# Patient Record
Sex: Female | Born: 1945
Health system: Southern US, Community
[De-identification: ages and names within clinical notes are randomized; demographics above are authoritative.]

## PROBLEM LIST (undated history)

## (undated) DIAGNOSIS — J449 Chronic obstructive pulmonary disease, unspecified: Secondary | ICD-10-CM

## (undated) DIAGNOSIS — G43909 Migraine, unspecified, not intractable, without status migrainosus: Secondary | ICD-10-CM

## (undated) DIAGNOSIS — Z720 Tobacco use: Secondary | ICD-10-CM

## (undated) DIAGNOSIS — Z8673 Personal history of transient ischemic attack (TIA), and cerebral infarction without residual deficits: Secondary | ICD-10-CM

## (undated) DIAGNOSIS — F32A Depression, unspecified: Secondary | ICD-10-CM

## (undated) DIAGNOSIS — E785 Hyperlipidemia, unspecified: Secondary | ICD-10-CM

## (undated) DIAGNOSIS — G2581 Restless legs syndrome: Secondary | ICD-10-CM

## (undated) DIAGNOSIS — G4700 Insomnia, unspecified: Secondary | ICD-10-CM

## (undated) DIAGNOSIS — Z87820 Personal history of traumatic brain injury: Secondary | ICD-10-CM

## (undated) DIAGNOSIS — R0981 Nasal congestion: Secondary | ICD-10-CM

## (undated) DIAGNOSIS — F329 Major depressive disorder, single episode, unspecified: Secondary | ICD-10-CM

## (undated) DIAGNOSIS — R05 Cough: Secondary | ICD-10-CM

## (undated) DIAGNOSIS — Z87442 Personal history of urinary calculi: Secondary | ICD-10-CM

## (undated) DIAGNOSIS — E559 Vitamin D deficiency, unspecified: Secondary | ICD-10-CM

## (undated) DIAGNOSIS — Z98811 Dental restoration status: Secondary | ICD-10-CM

## (undated) DIAGNOSIS — J45909 Unspecified asthma, uncomplicated: Secondary | ICD-10-CM

## (undated) DIAGNOSIS — Z972 Presence of dental prosthetic device (complete) (partial): Secondary | ICD-10-CM

## (undated) DIAGNOSIS — Z9889 Other specified postprocedural states: Secondary | ICD-10-CM

## (undated) DIAGNOSIS — C50919 Malignant neoplasm of unspecified site of unspecified female breast: Secondary | ICD-10-CM

## (undated) DIAGNOSIS — R112 Nausea with vomiting, unspecified: Secondary | ICD-10-CM

## (undated) HISTORY — DX: Migraine, unspecified, not intractable, without status migrainosus: G43.909

## (undated) HISTORY — DX: Major depressive disorder, single episode, unspecified: F32.9

## (undated) HISTORY — PX: PLANTAR FASCIA SURGERY: SHX746

## (undated) HISTORY — PX: NASAL SEPTUM SURGERY: SHX37

## (undated) HISTORY — DX: Chronic obstructive pulmonary disease, unspecified: J44.9

## (undated) HISTORY — DX: Restless legs syndrome: G25.81

## (undated) HISTORY — DX: Unspecified asthma, uncomplicated: J45.909

## (undated) HISTORY — PX: ABDOMINAL HYSTERECTOMY: SHX81

## (undated) HISTORY — PX: BREAST BIOPSY: SHX20

## (undated) HISTORY — DX: Hyperlipidemia, unspecified: E78.5

## (undated) HISTORY — PX: KNEE ARTHROSCOPY: SUR90

## (undated) HISTORY — PX: CARPAL TUNNEL RELEASE: SHX101

## (undated) HISTORY — DX: Depression, unspecified: F32.A

## (undated) HISTORY — DX: Insomnia, unspecified: G47.00

---

## 1998-03-05 HISTORY — PX: APPENDECTOMY: SHX54

## 1998-03-05 HISTORY — PX: ABDOMINAL HYSTERECTOMY: SUR658

## 2008-03-19 ENCOUNTER — Emergency Department (HOSPITAL_COMMUNITY): Admission: EM | Admit: 2008-03-19 | Discharge: 2008-03-19 | Payer: Self-pay | Admitting: Emergency Medicine

## 2008-03-19 ENCOUNTER — Ambulatory Visit: Payer: Self-pay | Admitting: *Deleted

## 2010-06-19 LAB — POCT I-STAT, CHEM 8
HCT: 44 % (ref 36.0–46.0)
Hemoglobin: 15 g/dL (ref 12.0–15.0)
Sodium: 138 mEq/L (ref 135–145)
TCO2: 25 mmol/L (ref 0–100)

## 2010-06-19 LAB — CBC
HCT: 42.7 % (ref 36.0–46.0)
MCV: 90.1 fL (ref 78.0–100.0)
Platelets: 224 10*3/uL (ref 150–400)
RDW: 12.3 % (ref 11.5–15.5)

## 2010-06-19 LAB — DIFFERENTIAL
Basophils Absolute: 0.1 10*3/uL (ref 0.0–0.1)
Eosinophils Absolute: 0.2 10*3/uL (ref 0.0–0.7)
Eosinophils Relative: 2 % (ref 0–5)

## 2010-06-19 LAB — POCT CARDIAC MARKERS: Myoglobin, poc: 42.2 ng/mL (ref 12–200)

## 2010-07-18 NOTE — Discharge Summary (Signed)
Anne [name], EGLEY               ACCOUNT NO.:  [id number]   MEDICAL RECORD NO.:  [id number]          PATIENT TYPE:  EMS   LOCATION:  [facility]JO                         FACILITY:  [facility]   PHYSICIAN:  Manning Charity, MD     DATE OF BIRTH:  1945/04/07   DATE OF ADMISSION:  03/19/2008  DATE OF DISCHARGE:  03/19/2008                               DISCHARGE SUMMARY   PRIMARY CARE PHYSICIAN:  Dr. Jacqualine [name].   Please note that the patient left AMA despite medical advice to remain  in the hospital and be admitted to rule out acute coronary syndrome.  The patient was aware that the risks of leaving the hospital against  medical advice included coronary ischemia and possible resultant death.  Despite this recommendation, the patient decided to leave the hospital  and pursue further care with her PCP, Dr. Lodema [name] and Dr. Bing [name].  Upon urging, she was amenable to continuing with possible cardiac workup  with her cardiologist and should pursue this upon discharge.   DISCHARGE DIAGNOSES:  1. Chest pain, with one negative cardiac enzyme, a normal EKG;      however, the patient refused to be admitted to have a full set of      cardiac enzymes cycled and thus could not be ruled out for an acute      coronary syndrome.  2. History of chronic obstructive pulmonary disease without oxygen      dependence and no previous hospitalizations.  3. Hyperlipidemia.  The patient is supposed to be on treatment, but      has not been taking it.  4. Irritable bowel syndrome/anxiety/insomnia.  5. History of ulcers greater than 10 years ago.  6. Seasonal allergies.  7. Migraines.  8. Status post hysterectomy, status post appendectomy, status post      oophorectomy, status post tonsillectomy.   DISCHARGE MEDICATIONS:  Given that the patient was leaving AMA, she is  to continue on the same medications that she was admitted with.  If she  has recurrent chest pain, she should take an aspirin and go immediately  to the ED.   CONDITION ON DISCHARGE:  Not able to be appropriately assessed secondary  to the patient leaving AMA.  The patient's chest pain had resolved;  however, cardiac ischemia could not be ruled out.   PROCEDURES:  A chest x-ray was obtained, which showed chronic-appearing  upper lobe scarring changes.  No definitive acute pulmonary process.   CONSULTATIONS:  None.   ADMISSION HISTORY AND PHYSICAL:  The patient is a 65 year old smoker  with hyperlipidemia, not on any medications; poor hyperlipidemia; COPD;  anxiety, who presents with sharp stabbing chest pain in the precordium  with radiation to the back.  Chest pain started at 10 a.m. this morning  and lasted until approximately 1 p.m. while the patient was in the ED.  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  The pain was  constant, started out suddenly at 6/10, and the intense pain resolved  after about 2 minutes and the  duller pain persisted until about 1 p.m.  and began to lessen slowly.  The patient took an aspirin x2 at work and  was given nitroglycerin in the ED.  The pain was not associated with any  activity or foods.  The patient had experienced similar pain 2 years ago  and worked up at [facility] and followup according to the patient  was normal when she had a treadmill stress test.  Pain was not  associated with shortness of breath, diaphoresis, nausea, vomiting.  The  patient reports mild worsening dyspnea on exertion that she associates  with her COPD.   PHYSICAL EXAMINATION:  VITAL SIGNS:  Blood pressure of 106/55, pulse of  67, respiratory rate of 17, temperature was not recorded in the ED, nor  was an O2 saturation.  GENERAL:  No apparent distress.  HEENT:  Eyes: PERRLA, EOMI.  Moist oral mucosa, pink oropharynx.  RESPIRATORY:  Clear to auscultation bilaterally.  Good air movement.  CARDIOVASCULAR:  Regular rate  and rhythm.  No murmurs, rubs, or gallops.  Normal S1 and S2.  No chest wall tenderness.  GI:  Soft, nontender, nondistended.  Normoactive bowel sounds.  EXTREMITIES:  No cyanosis, clubbing, or edema.  SKIN:  No rashes.  NEUROLOGIC:  Nonfocal.  Alert and oriented x3.  PSYCH:  Appropriate.   INITIAL LABORATORY DATA:  I-STAT:  Sodium 138, potassium 4.2, chloride  of 106, bicarbonate 25, BUN of 17, creatinine of 0.6, glucose of 87.  White blood cell count of 8.3, hemoglobin of 14.2, platelets of 224.  Initial cardiac enzymes were negative x1.   Initial EKG; normal sinus rhythm without any ST changes or T-wave  inversion.   HOSPITAL COURSE:  1. Chest pain:  The differential of the patient's chest pain was broad      including cardiac, GI, pulmonary, and possible psychogenic      etiologies.  The plan for the hospitalization was to rule out the      patient for an acute coronary syndrome and potentially investigate      other etiologies for the patient's chest pain.  An initial cardiac      enzyme was negative; however, this is not going to be sensitive      enough for coronary ischemia, thus, the patient was supposed to be      admitted to the hospital, but refused to do so.  The patient's      history was somewhat atypical for cardiac etiology given that there      is no relation to activity and the patient described it as a      stabbing chest pain.  We had also planned to risk stratify the      patient with a fasting lipid panel, consider an A1c if the blood      sugars were elevated, and to also obtain the patient's prior      records from [facility].  The patient has no history of      GERD, although has history of ulcers in the distant past.  No other      GI history.  Pulmonary etiologies were less likely with a negative      chest x-ray.  Psychogenic etiologies are also possible as the      patient was anxious this morning and has a history significant for      an  anxiety disorder.  The patient has apparently had a negative      treadmill  test around 2 years ago and a questionable negative      Myoview around 10 years ago.  We intended to admit the patient to      tele for observation and likely outpatient heart followup, with      possible repeat Myoview, if she ruled out for ACS.  We intended to      do give the patient Zocor, aspirin, nitroglycerin, and Protonix.      However, the patient only received aspirin and nitroglycerin      secondary to the patient leaving AMA.  2. Mild progressive shortness of breath:  Likely related to COPD.  No      peripheral edema.  No JVD.  No signs of pulmonary congestion.  We      intended to obtain BNP.  Likely, the patient would benefit from an      outpatient echo, if not already done.  We will attempt to obtain      old records from Cardiology at [facility].  However, again      this was not able to be done secondarily the patient leaving AMA.  3. COPD appears to be at baseline, was not in acute COPD exacerbation.  4. Anxiety:  Intended to continue her on her home medications.  5. Discharge labs.  Please see above as they are same as admission.  6. Pending labs:  None.       Linward Foster, MD  Electronically Signed      Manning Charity, MD  Electronically Signed    LW/MEDQ  D:  03/19/2008  T:  03/20/2008  Job:  [id number]   cc:   Durward Mallard

## 2013-09-02 HISTORY — PX: CATARACT EXTRACTION: SUR2

## 2013-09-04 DIAGNOSIS — Z23 Encounter for immunization: Secondary | ICD-10-CM | POA: Diagnosis not present

## 2013-09-10 DIAGNOSIS — H524 Presbyopia: Secondary | ICD-10-CM | POA: Diagnosis not present

## 2013-09-10 DIAGNOSIS — H25019 Cortical age-related cataract, unspecified eye: Secondary | ICD-10-CM | POA: Diagnosis not present

## 2013-09-10 DIAGNOSIS — H2510 Age-related nuclear cataract, unspecified eye: Secondary | ICD-10-CM | POA: Diagnosis not present

## 2013-09-29 DIAGNOSIS — H2510 Age-related nuclear cataract, unspecified eye: Secondary | ICD-10-CM | POA: Diagnosis not present

## 2013-10-02 DIAGNOSIS — J45909 Unspecified asthma, uncomplicated: Secondary | ICD-10-CM | POA: Diagnosis not present

## 2013-10-02 DIAGNOSIS — J441 Chronic obstructive pulmonary disease with (acute) exacerbation: Secondary | ICD-10-CM | POA: Diagnosis not present

## 2013-10-21 DIAGNOSIS — H25019 Cortical age-related cataract, unspecified eye: Secondary | ICD-10-CM | POA: Diagnosis not present

## 2013-10-21 DIAGNOSIS — H2510 Age-related nuclear cataract, unspecified eye: Secondary | ICD-10-CM | POA: Diagnosis not present

## 2013-10-27 DIAGNOSIS — H2510 Age-related nuclear cataract, unspecified eye: Secondary | ICD-10-CM | POA: Diagnosis not present

## 2013-11-06 DIAGNOSIS — F341 Dysthymic disorder: Secondary | ICD-10-CM | POA: Diagnosis not present

## 2013-11-06 DIAGNOSIS — R7301 Impaired fasting glucose: Secondary | ICD-10-CM | POA: Diagnosis not present

## 2013-11-06 DIAGNOSIS — J45909 Unspecified asthma, uncomplicated: Secondary | ICD-10-CM | POA: Diagnosis not present

## 2013-11-06 DIAGNOSIS — E7800 Pure hypercholesterolemia, unspecified: Secondary | ICD-10-CM | POA: Diagnosis not present

## 2013-11-06 DIAGNOSIS — Z79899 Other long term (current) drug therapy: Secondary | ICD-10-CM | POA: Diagnosis not present

## 2013-11-06 DIAGNOSIS — G43109 Migraine with aura, not intractable, without status migrainosus: Secondary | ICD-10-CM | POA: Diagnosis not present

## 2013-11-06 DIAGNOSIS — Z23 Encounter for immunization: Secondary | ICD-10-CM | POA: Diagnosis not present

## 2013-11-25 DIAGNOSIS — B370 Candidal stomatitis: Secondary | ICD-10-CM | POA: Diagnosis not present

## 2013-11-25 DIAGNOSIS — G43109 Migraine with aura, not intractable, without status migrainosus: Secondary | ICD-10-CM | POA: Diagnosis not present

## 2014-01-01 DIAGNOSIS — Z87820 Personal history of traumatic brain injury: Secondary | ICD-10-CM

## 2014-01-01 HISTORY — DX: Personal history of traumatic brain injury: Z87.820

## 2014-01-02 DIAGNOSIS — S9001XA Contusion of right ankle, initial encounter: Secondary | ICD-10-CM | POA: Diagnosis not present

## 2014-01-02 DIAGNOSIS — S60212A Contusion of left wrist, initial encounter: Secondary | ICD-10-CM | POA: Diagnosis not present

## 2014-01-04 DIAGNOSIS — R55 Syncope and collapse: Secondary | ICD-10-CM | POA: Diagnosis not present

## 2014-01-04 DIAGNOSIS — M25571 Pain in right ankle and joints of right foot: Secondary | ICD-10-CM | POA: Diagnosis not present

## 2014-01-04 DIAGNOSIS — R410 Disorientation, unspecified: Secondary | ICD-10-CM | POA: Diagnosis not present

## 2014-01-04 DIAGNOSIS — G319 Degenerative disease of nervous system, unspecified: Secondary | ICD-10-CM | POA: Diagnosis not present

## 2014-01-04 DIAGNOSIS — W19XXXA Unspecified fall, initial encounter: Secondary | ICD-10-CM | POA: Diagnosis not present

## 2014-01-04 DIAGNOSIS — S0081XD Abrasion of other part of head, subsequent encounter: Secondary | ICD-10-CM | POA: Diagnosis not present

## 2014-01-04 DIAGNOSIS — M25532 Pain in left wrist: Secondary | ICD-10-CM | POA: Diagnosis not present

## 2014-01-04 DIAGNOSIS — S0910XD Unspecified injury of muscle and tendon of head, subsequent encounter: Secondary | ICD-10-CM | POA: Diagnosis not present

## 2014-01-04 DIAGNOSIS — R42 Dizziness and giddiness: Secondary | ICD-10-CM | POA: Diagnosis not present

## 2014-01-04 DIAGNOSIS — R4182 Altered mental status, unspecified: Secondary | ICD-10-CM | POA: Diagnosis not present

## 2014-01-11 DIAGNOSIS — S0910XD Unspecified injury of muscle and tendon of head, subsequent encounter: Secondary | ICD-10-CM | POA: Diagnosis not present

## 2014-01-11 DIAGNOSIS — R51 Headache: Secondary | ICD-10-CM | POA: Diagnosis not present

## 2014-01-11 DIAGNOSIS — M5431 Sciatica, right side: Secondary | ICD-10-CM | POA: Diagnosis not present

## 2014-01-11 DIAGNOSIS — S0081XD Abrasion of other part of head, subsequent encounter: Secondary | ICD-10-CM | POA: Diagnosis not present

## 2014-01-11 DIAGNOSIS — R410 Disorientation, unspecified: Secondary | ICD-10-CM | POA: Diagnosis not present

## 2014-01-11 DIAGNOSIS — R42 Dizziness and giddiness: Secondary | ICD-10-CM | POA: Diagnosis not present

## 2014-01-14 DIAGNOSIS — S0910XD Unspecified injury of muscle and tendon of head, subsequent encounter: Secondary | ICD-10-CM | POA: Diagnosis not present

## 2014-01-14 DIAGNOSIS — R4781 Slurred speech: Secondary | ICD-10-CM | POA: Diagnosis not present

## 2014-01-14 DIAGNOSIS — R410 Disorientation, unspecified: Secondary | ICD-10-CM | POA: Diagnosis not present

## 2014-01-14 DIAGNOSIS — W19XXXD Unspecified fall, subsequent encounter: Secondary | ICD-10-CM | POA: Diagnosis not present

## 2014-01-14 DIAGNOSIS — S199XXA Unspecified injury of neck, initial encounter: Secondary | ICD-10-CM | POA: Diagnosis not present

## 2014-01-14 DIAGNOSIS — R42 Dizziness and giddiness: Secondary | ICD-10-CM | POA: Diagnosis not present

## 2014-01-14 DIAGNOSIS — M542 Cervicalgia: Secondary | ICD-10-CM | POA: Diagnosis not present

## 2014-01-14 DIAGNOSIS — M5032 Other cervical disc degeneration, mid-cervical region: Secondary | ICD-10-CM | POA: Diagnosis not present

## 2014-01-14 DIAGNOSIS — S0003XA Contusion of scalp, initial encounter: Secondary | ICD-10-CM | POA: Diagnosis not present

## 2014-01-14 DIAGNOSIS — R4701 Aphasia: Secondary | ICD-10-CM | POA: Diagnosis not present

## 2014-01-14 DIAGNOSIS — S0003XD Contusion of scalp, subsequent encounter: Secondary | ICD-10-CM | POA: Diagnosis not present

## 2014-01-14 DIAGNOSIS — R51 Headache: Secondary | ICD-10-CM | POA: Diagnosis not present

## 2014-01-25 DIAGNOSIS — J0100 Acute maxillary sinusitis, unspecified: Secondary | ICD-10-CM | POA: Diagnosis not present

## 2014-02-04 DIAGNOSIS — R4781 Slurred speech: Secondary | ICD-10-CM | POA: Diagnosis not present

## 2014-02-04 DIAGNOSIS — S0910XD Unspecified injury of muscle and tendon of head, subsequent encounter: Secondary | ICD-10-CM | POA: Diagnosis not present

## 2014-02-04 DIAGNOSIS — W19XXXA Unspecified fall, initial encounter: Secondary | ICD-10-CM | POA: Diagnosis not present

## 2014-02-04 DIAGNOSIS — R410 Disorientation, unspecified: Secondary | ICD-10-CM | POA: Diagnosis not present

## 2014-02-04 DIAGNOSIS — R4789 Other speech disturbances: Secondary | ICD-10-CM | POA: Diagnosis not present

## 2014-02-04 DIAGNOSIS — R634 Abnormal weight loss: Secondary | ICD-10-CM | POA: Diagnosis not present

## 2014-02-04 DIAGNOSIS — R51 Headache: Secondary | ICD-10-CM | POA: Diagnosis not present

## 2014-02-04 DIAGNOSIS — R479 Unspecified speech disturbances: Secondary | ICD-10-CM | POA: Diagnosis not present

## 2014-02-08 DIAGNOSIS — R930 Abnormal findings on diagnostic imaging of skull and head, not elsewhere classified: Secondary | ICD-10-CM | POA: Diagnosis not present

## 2014-02-08 DIAGNOSIS — G43109 Migraine with aura, not intractable, without status migrainosus: Secondary | ICD-10-CM | POA: Insufficient documentation

## 2014-02-08 DIAGNOSIS — N201 Calculus of ureter: Secondary | ICD-10-CM | POA: Diagnosis not present

## 2014-02-08 DIAGNOSIS — Z1231 Encounter for screening mammogram for malignant neoplasm of breast: Secondary | ICD-10-CM | POA: Diagnosis not present

## 2014-02-08 DIAGNOSIS — R310 Gross hematuria: Secondary | ICD-10-CM | POA: Diagnosis not present

## 2014-02-08 HISTORY — DX: Abnormal findings on diagnostic imaging of skull and head, not elsewhere classified: R93.0

## 2014-02-08 HISTORY — DX: Migraine with aura, not intractable, without status migrainosus: G43.109

## 2014-02-24 DIAGNOSIS — N200 Calculus of kidney: Secondary | ICD-10-CM | POA: Diagnosis not present

## 2014-02-24 DIAGNOSIS — J4520 Mild intermittent asthma, uncomplicated: Secondary | ICD-10-CM | POA: Diagnosis not present

## 2014-02-24 DIAGNOSIS — G43109 Migraine with aura, not intractable, without status migrainosus: Secondary | ICD-10-CM | POA: Diagnosis not present

## 2014-02-24 DIAGNOSIS — R4781 Slurred speech: Secondary | ICD-10-CM | POA: Diagnosis not present

## 2014-02-24 DIAGNOSIS — E782 Mixed hyperlipidemia: Secondary | ICD-10-CM | POA: Diagnosis not present

## 2014-02-24 DIAGNOSIS — R7301 Impaired fasting glucose: Secondary | ICD-10-CM | POA: Diagnosis not present

## 2014-02-24 DIAGNOSIS — E780 Pure hypercholesterolemia: Secondary | ICD-10-CM | POA: Diagnosis not present

## 2014-03-06 DIAGNOSIS — J029 Acute pharyngitis, unspecified: Secondary | ICD-10-CM | POA: Diagnosis not present

## 2014-03-06 DIAGNOSIS — J0100 Acute maxillary sinusitis, unspecified: Secondary | ICD-10-CM | POA: Diagnosis not present

## 2014-03-16 DIAGNOSIS — J441 Chronic obstructive pulmonary disease with (acute) exacerbation: Secondary | ICD-10-CM | POA: Diagnosis not present

## 2014-04-01 DIAGNOSIS — Z86010 Personal history of colonic polyps: Secondary | ICD-10-CM | POA: Diagnosis not present

## 2014-04-01 DIAGNOSIS — K5900 Constipation, unspecified: Secondary | ICD-10-CM | POA: Diagnosis not present

## 2014-04-16 DIAGNOSIS — Z1211 Encounter for screening for malignant neoplasm of colon: Secondary | ICD-10-CM | POA: Diagnosis not present

## 2014-04-16 DIAGNOSIS — Z90710 Acquired absence of both cervix and uterus: Secondary | ICD-10-CM | POA: Diagnosis not present

## 2014-04-16 DIAGNOSIS — F17210 Nicotine dependence, cigarettes, uncomplicated: Secondary | ICD-10-CM | POA: Diagnosis not present

## 2014-04-16 DIAGNOSIS — Z86010 Personal history of colonic polyps: Secondary | ICD-10-CM | POA: Diagnosis not present

## 2014-04-16 DIAGNOSIS — Z9049 Acquired absence of other specified parts of digestive tract: Secondary | ICD-10-CM | POA: Diagnosis not present

## 2014-04-16 DIAGNOSIS — J45909 Unspecified asthma, uncomplicated: Secondary | ICD-10-CM | POA: Diagnosis not present

## 2014-04-16 DIAGNOSIS — Z800 Family history of malignant neoplasm of digestive organs: Secondary | ICD-10-CM | POA: Diagnosis not present

## 2014-04-16 LAB — HM COLONOSCOPY

## 2014-04-21 DIAGNOSIS — Z0000 Encounter for general adult medical examination without abnormal findings: Secondary | ICD-10-CM | POA: Diagnosis not present

## 2014-04-21 DIAGNOSIS — Z1211 Encounter for screening for malignant neoplasm of colon: Secondary | ICD-10-CM | POA: Diagnosis not present

## 2014-04-30 DIAGNOSIS — F410 Panic disorder [episodic paroxysmal anxiety] without agoraphobia: Secondary | ICD-10-CM | POA: Diagnosis not present

## 2014-05-03 DIAGNOSIS — G43011 Migraine without aura, intractable, with status migrainosus: Secondary | ICD-10-CM | POA: Diagnosis not present

## 2014-05-03 DIAGNOSIS — F321 Major depressive disorder, single episode, moderate: Secondary | ICD-10-CM | POA: Diagnosis not present

## 2014-05-26 DIAGNOSIS — L821 Other seborrheic keratosis: Secondary | ICD-10-CM | POA: Diagnosis not present

## 2014-05-26 DIAGNOSIS — D485 Neoplasm of uncertain behavior of skin: Secondary | ICD-10-CM | POA: Diagnosis not present

## 2014-05-26 DIAGNOSIS — C44319 Basal cell carcinoma of skin of other parts of face: Secondary | ICD-10-CM | POA: Diagnosis not present

## 2014-05-26 DIAGNOSIS — D225 Melanocytic nevi of trunk: Secondary | ICD-10-CM | POA: Diagnosis not present

## 2014-05-26 DIAGNOSIS — L720 Epidermal cyst: Secondary | ICD-10-CM | POA: Diagnosis not present

## 2014-05-26 DIAGNOSIS — F410 Panic disorder [episodic paroxysmal anxiety] without agoraphobia: Secondary | ICD-10-CM | POA: Diagnosis not present

## 2014-05-26 DIAGNOSIS — L820 Inflamed seborrheic keratosis: Secondary | ICD-10-CM | POA: Diagnosis not present

## 2014-05-31 DIAGNOSIS — E559 Vitamin D deficiency, unspecified: Secondary | ICD-10-CM | POA: Diagnosis not present

## 2014-05-31 DIAGNOSIS — E780 Pure hypercholesterolemia: Secondary | ICD-10-CM | POA: Diagnosis not present

## 2014-05-31 DIAGNOSIS — G43109 Migraine with aura, not intractable, without status migrainosus: Secondary | ICD-10-CM | POA: Diagnosis not present

## 2014-05-31 DIAGNOSIS — R7301 Impaired fasting glucose: Secondary | ICD-10-CM | POA: Diagnosis not present

## 2014-05-31 DIAGNOSIS — F5109 Other insomnia not due to a substance or known physiological condition: Secondary | ICD-10-CM | POA: Diagnosis not present

## 2014-05-31 DIAGNOSIS — J4520 Mild intermittent asthma, uncomplicated: Secondary | ICD-10-CM | POA: Diagnosis not present

## 2014-05-31 DIAGNOSIS — F320 Major depressive disorder, single episode, mild: Secondary | ICD-10-CM | POA: Diagnosis not present

## 2014-06-16 DIAGNOSIS — F410 Panic disorder [episodic paroxysmal anxiety] without agoraphobia: Secondary | ICD-10-CM | POA: Diagnosis not present

## 2014-06-23 DIAGNOSIS — F410 Panic disorder [episodic paroxysmal anxiety] without agoraphobia: Secondary | ICD-10-CM | POA: Diagnosis not present

## 2014-06-28 DIAGNOSIS — D225 Melanocytic nevi of trunk: Secondary | ICD-10-CM | POA: Diagnosis not present

## 2014-06-28 DIAGNOSIS — C44319 Basal cell carcinoma of skin of other parts of face: Secondary | ICD-10-CM | POA: Diagnosis not present

## 2014-06-28 DIAGNOSIS — L821 Other seborrheic keratosis: Secondary | ICD-10-CM | POA: Diagnosis not present

## 2014-07-02 DIAGNOSIS — R55 Syncope and collapse: Secondary | ICD-10-CM | POA: Diagnosis not present

## 2014-07-02 DIAGNOSIS — F320 Major depressive disorder, single episode, mild: Secondary | ICD-10-CM | POA: Diagnosis not present

## 2014-07-02 DIAGNOSIS — G43109 Migraine with aura, not intractable, without status migrainosus: Secondary | ICD-10-CM | POA: Diagnosis not present

## 2014-07-02 DIAGNOSIS — F5109 Other insomnia not due to a substance or known physiological condition: Secondary | ICD-10-CM | POA: Diagnosis not present

## 2014-07-02 DIAGNOSIS — R9431 Abnormal electrocardiogram [ECG] [EKG]: Secondary | ICD-10-CM | POA: Diagnosis not present

## 2014-07-02 DIAGNOSIS — J441 Chronic obstructive pulmonary disease with (acute) exacerbation: Secondary | ICD-10-CM | POA: Diagnosis not present

## 2014-07-02 DIAGNOSIS — I951 Orthostatic hypotension: Secondary | ICD-10-CM | POA: Diagnosis not present

## 2014-07-06 DIAGNOSIS — R55 Syncope and collapse: Secondary | ICD-10-CM | POA: Diagnosis not present

## 2014-07-07 DIAGNOSIS — F410 Panic disorder [episodic paroxysmal anxiety] without agoraphobia: Secondary | ICD-10-CM | POA: Diagnosis not present

## 2014-07-09 DIAGNOSIS — F320 Major depressive disorder, single episode, mild: Secondary | ICD-10-CM | POA: Diagnosis not present

## 2014-07-11 DIAGNOSIS — R55 Syncope and collapse: Secondary | ICD-10-CM | POA: Diagnosis not present

## 2014-07-21 DIAGNOSIS — F410 Panic disorder [episodic paroxysmal anxiety] without agoraphobia: Secondary | ICD-10-CM | POA: Diagnosis not present

## 2014-07-27 DIAGNOSIS — F410 Panic disorder [episodic paroxysmal anxiety] without agoraphobia: Secondary | ICD-10-CM | POA: Diagnosis not present

## 2014-07-30 DIAGNOSIS — I493 Ventricular premature depolarization: Secondary | ICD-10-CM | POA: Diagnosis not present

## 2014-07-30 DIAGNOSIS — I491 Atrial premature depolarization: Secondary | ICD-10-CM | POA: Diagnosis not present

## 2014-08-03 DIAGNOSIS — R55 Syncope and collapse: Secondary | ICD-10-CM | POA: Diagnosis not present

## 2014-08-04 DIAGNOSIS — F410 Panic disorder [episodic paroxysmal anxiety] without agoraphobia: Secondary | ICD-10-CM | POA: Diagnosis not present

## 2014-08-16 DIAGNOSIS — F410 Panic disorder [episodic paroxysmal anxiety] without agoraphobia: Secondary | ICD-10-CM | POA: Diagnosis not present

## 2014-08-27 DIAGNOSIS — H43391 Other vitreous opacities, right eye: Secondary | ICD-10-CM | POA: Diagnosis not present

## 2014-08-27 DIAGNOSIS — H43812 Vitreous degeneration, left eye: Secondary | ICD-10-CM | POA: Diagnosis not present

## 2014-09-01 DIAGNOSIS — F410 Panic disorder [episodic paroxysmal anxiety] without agoraphobia: Secondary | ICD-10-CM | POA: Diagnosis not present

## 2014-09-10 DIAGNOSIS — H43812 Vitreous degeneration, left eye: Secondary | ICD-10-CM | POA: Diagnosis not present

## 2014-09-10 DIAGNOSIS — H43391 Other vitreous opacities, right eye: Secondary | ICD-10-CM | POA: Diagnosis not present

## 2014-09-14 DIAGNOSIS — G2581 Restless legs syndrome: Secondary | ICD-10-CM | POA: Diagnosis not present

## 2014-09-14 DIAGNOSIS — G43109 Migraine with aura, not intractable, without status migrainosus: Secondary | ICD-10-CM | POA: Diagnosis not present

## 2014-09-14 DIAGNOSIS — E780 Pure hypercholesterolemia: Secondary | ICD-10-CM | POA: Diagnosis not present

## 2014-09-14 DIAGNOSIS — I10 Essential (primary) hypertension: Secondary | ICD-10-CM | POA: Diagnosis not present

## 2014-09-14 DIAGNOSIS — F320 Major depressive disorder, single episode, mild: Secondary | ICD-10-CM | POA: Diagnosis not present

## 2014-09-14 DIAGNOSIS — F5109 Other insomnia not due to a substance or known physiological condition: Secondary | ICD-10-CM | POA: Diagnosis not present

## 2014-09-14 DIAGNOSIS — J4520 Mild intermittent asthma, uncomplicated: Secondary | ICD-10-CM | POA: Diagnosis not present

## 2014-09-14 DIAGNOSIS — R7301 Impaired fasting glucose: Secondary | ICD-10-CM | POA: Diagnosis not present

## 2014-09-30 DIAGNOSIS — S60222A Contusion of left hand, initial encounter: Secondary | ICD-10-CM | POA: Diagnosis not present

## 2014-09-30 DIAGNOSIS — S60512A Abrasion of left hand, initial encounter: Secondary | ICD-10-CM | POA: Diagnosis not present

## 2014-10-04 DIAGNOSIS — Z8673 Personal history of transient ischemic attack (TIA), and cerebral infarction without residual deficits: Secondary | ICD-10-CM

## 2014-10-04 HISTORY — DX: Personal history of transient ischemic attack (TIA), and cerebral infarction without residual deficits: Z86.73

## 2014-10-19 DIAGNOSIS — H26492 Other secondary cataract, left eye: Secondary | ICD-10-CM | POA: Diagnosis not present

## 2014-10-19 DIAGNOSIS — H532 Diplopia: Secondary | ICD-10-CM | POA: Diagnosis not present

## 2014-10-19 DIAGNOSIS — H04123 Dry eye syndrome of bilateral lacrimal glands: Secondary | ICD-10-CM | POA: Diagnosis not present

## 2014-10-25 DIAGNOSIS — F320 Major depressive disorder, single episode, mild: Secondary | ICD-10-CM | POA: Diagnosis not present

## 2014-10-25 DIAGNOSIS — G43109 Migraine with aura, not intractable, without status migrainosus: Secondary | ICD-10-CM | POA: Diagnosis not present

## 2014-10-25 DIAGNOSIS — G2581 Restless legs syndrome: Secondary | ICD-10-CM | POA: Diagnosis not present

## 2014-10-25 DIAGNOSIS — F5109 Other insomnia not due to a substance or known physiological condition: Secondary | ICD-10-CM | POA: Diagnosis not present

## 2014-10-30 DIAGNOSIS — R404 Transient alteration of awareness: Secondary | ICD-10-CM | POA: Diagnosis not present

## 2014-10-30 DIAGNOSIS — R42 Dizziness and giddiness: Secondary | ICD-10-CM | POA: Diagnosis not present

## 2014-10-31 DIAGNOSIS — F329 Major depressive disorder, single episode, unspecified: Secondary | ICD-10-CM | POA: Diagnosis not present

## 2014-10-31 DIAGNOSIS — R42 Dizziness and giddiness: Secondary | ICD-10-CM | POA: Diagnosis not present

## 2014-10-31 DIAGNOSIS — Z79899 Other long term (current) drug therapy: Secondary | ICD-10-CM | POA: Diagnosis not present

## 2014-10-31 DIAGNOSIS — E876 Hypokalemia: Secondary | ICD-10-CM | POA: Diagnosis not present

## 2014-10-31 DIAGNOSIS — E782 Mixed hyperlipidemia: Secondary | ICD-10-CM | POA: Diagnosis not present

## 2014-10-31 DIAGNOSIS — E785 Hyperlipidemia, unspecified: Secondary | ICD-10-CM | POA: Diagnosis not present

## 2014-10-31 DIAGNOSIS — J4540 Moderate persistent asthma, uncomplicated: Secondary | ICD-10-CM | POA: Diagnosis not present

## 2014-10-31 DIAGNOSIS — Z720 Tobacco use: Secondary | ICD-10-CM | POA: Diagnosis not present

## 2014-10-31 DIAGNOSIS — E162 Hypoglycemia, unspecified: Secondary | ICD-10-CM | POA: Diagnosis not present

## 2014-10-31 DIAGNOSIS — R930 Abnormal findings on diagnostic imaging of skull and head, not elsewhere classified: Secondary | ICD-10-CM | POA: Diagnosis not present

## 2014-10-31 DIAGNOSIS — R531 Weakness: Secondary | ICD-10-CM | POA: Diagnosis not present

## 2014-10-31 DIAGNOSIS — Z716 Tobacco abuse counseling: Secondary | ICD-10-CM | POA: Diagnosis not present

## 2014-10-31 DIAGNOSIS — J45909 Unspecified asthma, uncomplicated: Secondary | ICD-10-CM | POA: Diagnosis not present

## 2014-10-31 DIAGNOSIS — F17210 Nicotine dependence, cigarettes, uncomplicated: Secondary | ICD-10-CM | POA: Diagnosis not present

## 2014-10-31 DIAGNOSIS — R4781 Slurred speech: Secondary | ICD-10-CM | POA: Diagnosis not present

## 2014-10-31 DIAGNOSIS — R51 Headache: Secondary | ICD-10-CM | POA: Diagnosis not present

## 2014-10-31 DIAGNOSIS — G43009 Migraine without aura, not intractable, without status migrainosus: Secondary | ICD-10-CM | POA: Diagnosis not present

## 2014-10-31 DIAGNOSIS — G459 Transient cerebral ischemic attack, unspecified: Secondary | ICD-10-CM | POA: Diagnosis not present

## 2014-10-31 DIAGNOSIS — G43909 Migraine, unspecified, not intractable, without status migrainosus: Secondary | ICD-10-CM | POA: Diagnosis not present

## 2014-11-12 DIAGNOSIS — Z23 Encounter for immunization: Secondary | ICD-10-CM | POA: Diagnosis not present

## 2014-11-12 DIAGNOSIS — I6350 Cerebral infarction due to unspecified occlusion or stenosis of unspecified cerebral artery: Secondary | ICD-10-CM | POA: Diagnosis not present

## 2014-11-12 DIAGNOSIS — R4701 Aphasia: Secondary | ICD-10-CM | POA: Diagnosis not present

## 2014-11-12 DIAGNOSIS — H538 Other visual disturbances: Secondary | ICD-10-CM | POA: Diagnosis not present

## 2014-11-17 DIAGNOSIS — I6350 Cerebral infarction due to unspecified occlusion or stenosis of unspecified cerebral artery: Secondary | ICD-10-CM | POA: Diagnosis not present

## 2014-11-17 DIAGNOSIS — I6523 Occlusion and stenosis of bilateral carotid arteries: Secondary | ICD-10-CM | POA: Diagnosis not present

## 2014-11-17 DIAGNOSIS — I358 Other nonrheumatic aortic valve disorders: Secondary | ICD-10-CM | POA: Diagnosis not present

## 2014-11-19 DIAGNOSIS — H532 Diplopia: Secondary | ICD-10-CM | POA: Diagnosis not present

## 2014-11-19 DIAGNOSIS — R51 Headache: Secondary | ICD-10-CM | POA: Diagnosis not present

## 2014-11-19 DIAGNOSIS — R4702 Dysphasia: Secondary | ICD-10-CM | POA: Diagnosis not present

## 2014-12-02 ENCOUNTER — Encounter: Payer: Self-pay | Admitting: *Deleted

## 2014-12-02 ENCOUNTER — Ambulatory Visit (INDEPENDENT_AMBULATORY_CARE_PROVIDER_SITE_OTHER): Payer: Medicare Other | Admitting: Diagnostic Neuroimaging

## 2014-12-02 VITALS — BP 98/54 | HR 58 | Ht 62.0 in | Wt 111.2 lb

## 2014-12-02 DIAGNOSIS — G43909 Migraine, unspecified, not intractable, without status migrainosus: Secondary | ICD-10-CM | POA: Diagnosis not present

## 2014-12-02 DIAGNOSIS — I6521 Occlusion and stenosis of right carotid artery: Secondary | ICD-10-CM | POA: Diagnosis not present

## 2014-12-02 DIAGNOSIS — G458 Other transient cerebral ischemic attacks and related syndromes: Secondary | ICD-10-CM | POA: Diagnosis not present

## 2014-12-02 DIAGNOSIS — G43109 Migraine with aura, not intractable, without status migrainosus: Secondary | ICD-10-CM

## 2014-12-02 NOTE — Progress Notes (Signed)
[facility]  PATIENT: Anne [name] DOB: September 26, 1945  REFERRING CLINICIAN: Olivia Canter, MD HISTORY FROM: patient  REASON FOR VISIT: new consult    HISTORICAL  CHIEF COMPLAINT:  Chief Complaint  Patient presents with  . Visual Disturbance    rm 7, New Patient    HISTORY OF PRESENT ILLNESS:   69 year old right-handed female with history of hypercholesteremia, migraine, anxiety, tobacco abuse, here for evaluation of TIA versus complicated migraine.  10/30/14 patient was watching television, when her friend At her on the phone. Patient replied by text and then the patient's friend asked her to call. Patient tried calling and speaking verbally but had trouble with her words. Patient was able to contact her son who lives next door who came to evaluate her. Patient had 15 minutes of language and speech disturbance. Patient was taken to the hospital and admitted for stroke/TIA workup. CT head showed no acute findings. She was recommended to have further testing but she requested to go home but she did not have a ride on Monday. Patient was discharged and had further workup by PCP.  MRI brain showed no acute findings. Carotid ultrasound showed right internal carotid artery stenosis possibly 50-70%. Left internal carotid artery had no stenosis. Echocardiogram was unremarkable. Patient was on aspirin 81 mg when this event occurred. She was switched to Plavix by PCP but patient could not tolerate it. Now patient is on aspirin 325 mg daily. No further recurrent events.  Patient also has history of migraine headache starting in her 29s. She describes right-sided throbbing severe headaches with nausea, photophobia, phonophobia. No food triggers. Stress seems to aggravate her headaches. No warning or prodromal aura symptoms. Patient averages 2-3 migraines per month. She is on topiramate and sumatriptan for headache management.  October 2015 patient had a concussion with resultant  stuttering speech and language difficulty. The symptoms have continued up until today.    REVIEW OF SYSTEMS: Full 14 system review of systems performed and notable only for weight loss blurred vision double vision memory loss confusion headache weakness dizziness passing out insomnia restless legs anxiety not asleep change in appetite allergies runny nose moles.  ALLERGIES: Allergies  Allergen Reactions  . Gabapentin Other (See Comments)    Personality change  . Lyrica [Pregabalin]     Anxiety, depression, suicidal thoughts, personality changes  . Rocephin [Ceftriaxone Sodium In Dextrose] Rash    HOME MEDICATIONS: No outpatient prescriptions prior to visit.   No facility-administered medications prior to visit.   Prior to Admission medications   Medication Sig Start Date End Date Taking? Authorizing Provider  Aclidinium Bromide (TUDORZA PRESSAIR) 400 MCG/ACT AEPB Inhale into the lungs.   Yes Historical Provider, MD  aspirin 325 MG EC tablet Take 325 mg by mouth daily.   Yes Historical Provider, MD  atorvastatin (LIPITOR) 40 MG tablet Take 40 mg by mouth. 11/25/13  Yes Historical Provider, MD  BREO ELLIPTA 100-25 MCG/INH AEPB  09/15/14  Yes Historical Provider, MD  Cholecalciferol (VITAMIN D3) 50000 UNITS CAPS Take by mouth.   Yes Historical Provider, MD  Eszopiclone (ESZOPICLONE) 3 MG TABS Take 3 mg by mouth at bedtime. Take immediately before bedtime   Yes Historical Provider, MD  fenofibrate 160 MG tablet Take 160 mg by mouth.   Yes Historical Provider, MD  HYDROcodone-acetaminophen (NORCO/VICODIN) 5-325 MG tablet Take by mouth. 02/05/14  Yes Historical Provider, MD  hydrOXYzine (ATARAX/VISTARIL) 50 MG tablet Take 50 mg by mouth.   Yes Historical Provider, MD  montelukast (SINGULAIR) 10 MG tablet Take 10 mg by mouth at bedtime.   Yes Historical Provider, MD  PARoxetine (PAXIL) 40 MG tablet Take 40 mg by mouth every morning.   Yes Historical Provider, MD  pramipexole (MIRAPEX) 0.5 MG  tablet Take 0.5 mg by mouth 3 (three) times daily. Two at bedtime   Yes Historical Provider, MD  SUMAtriptan (IMITREX) 100 MG tablet Take 100 mg by mouth. 11/25/13  Yes Historical Provider, MD  topiramate (TOPAMAX) 50 MG tablet Take 50 mg by mouth 2 (two) times daily.   Yes Historical Provider, MD  traZODone (DESYREL) 100 MG tablet Take 200 mg by mouth. 01/26/14  Yes Historical Provider, MD  VENTOLIN HFA 108 (90 BASE) MCG/ACT inhaler  09/29/14  Yes Historical Provider, MD     PAST MEDICAL HISTORY: Past Medical History  Diagnosis Date  . Hyperlipidemia   . Asthma   . Migraine   . RLS (restless legs syndrome)   . COPD (chronic obstructive pulmonary disease)   . Insomnia   . Kidney stone   . Depression     PAST SURGICAL HISTORY: Past Surgical History  Procedure Laterality Date  . Knee arthroscopy Left   . Appendectomy  2000  . Abdominal hysterectomy  2000  . Cataract extraction Left 09/2013  . Nose surgery      x 3  . Carpel tunnel surgery Bilateral   . Foot surgery Left   . Breast biopsy Left     benign    FAMILY HISTORY: Family History  Problem Relation Age of Onset  . Cancer Mother     cervical  . Cancer Maternal Grandfather   . Heart disease Father   . Cancer Maternal Grandmother     SOCIAL HISTORY:  Social History   Social History  . Marital Status: Widowed    Spouse Name: N/A  . Number of Children: 1  . Years of Education: 12   Occupational History  .      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 Smoking status: Current Every Day Smoker -- 0.50 packs/day for 50 years    Types: Cigarettes  . Smokeless tobacco: Not on file  . Alcohol Use: No  . Drug Use: No  . Sexual Activity: Not on file   Other Topics Concern  . Not on file   Social History Narrative   Lives alone, widow   Caffeine use- tea, 1 cup daily     PHYSICAL EXAM  GENERAL EXAM/CONSTITUTIONAL: Vitals:  Filed Vitals:   12/02/14 1338  BP: 98/54  Pulse: 58    Height: 5\' 2"  (1.575 m)  Weight: 111 lb 3.2 oz (50.44 kg)     Body mass index is 20.33 kg/(m^2).  Visual Acuity Screening   Right eye Left eye Both eyes  Without correction:     With correction: 20/40 20/70      Patient is in no distress; well developed, nourished and groomed; neck is supple  CARDIOVASCULAR:  Examination of carotid arteries is normal; no carotid bruits  Regular rate and rhythm, no murmurs  Examination of peripheral vascular system by observation and palpation is normal  EYES:  Ophthalmoscopic exam of optic discs and posterior segments is normal; no papilledema or hemorrhages  MUSCULOSKELETAL:  Gait, strength, tone, movements noted in Neurologic exam below  NEUROLOGIC: MENTAL STATUS:  No flowsheet data found.  awake, alert, oriented to person, place and time  recent and remote memory intact  normal attention and  concentration  language fluent, comprehension intact, naming intact,   fund of knowledge appropriate  CRANIAL NERVE:   2nd - no papilledema on fundoscopic exam  2nd, 3rd, 4th, 6th - pupils equal and reactive to light, visual fields full to confrontation, extraocular muscles intact, no nystagmus  5th - facial sensation symmetric  7th - facial strength symmetric  8th - hearing intact  9th - palate elevates symmetrically, uvula midline  11th - shoulder shrug symmetric  12th - tongue protrusion midline  MOTOR:   normal bulk and tone, full strength in the BUE, BLE  SENSORY:   normal and symmetric to light touch, pinprick, temperature, vibration  COORDINATION:   finger-nose-finger, fine finger movements normal  REFLEXES:   deep tendon reflexes TRACE and symmetric  GAIT/STATION:   narrow based gait; able to walk tandem; romberg is negative    DIAGNOSTIC DATA (LABS, IMAGING, TESTING) - I reviewed patient records, labs, notes, testing and imaging myself where available.  Lab Results  Component Value Date   WBC  8.3 03/19/2008   HGB 15.0 03/19/2008   HCT 44.0 03/19/2008   MCV 90.1 03/19/2008   PLT 224 03/19/2008      Component Value Date/Time   NA 138 03/19/2008 1142   K 4.2 03/19/2008 1142   CL 106 03/19/2008 1142   GLUCOSE 87 03/19/2008 1142   BUN 17 03/19/2008 1142   CREATININE 0.6 03/19/2008 1142   No results found for: CHOL, HDL, LDLCALC, LDLDIRECT, TRIG, CHOLHDL No results found for: HGBA1C No results found for: VITAMINB12 No results found for: TSH   11/19/14 MRI brain  - No acute intracranial findings. No evidence for recent infarction. Unremarkable intracranial MRA.  11/15/14 Carotid u/s  - right internal carotid artery: Heterogeneous and partially calcified plaque with 50-70% stenosis - left internal carotid artery: No significant stenosis  11/17/14 TTE  - Normal left ventricular size function - No cardiac source of emboli     ASSESSMENT AND PLAN  69 y.o. year old female here with suspected left brain TIA vs complicated migraine. Also found to have right ICA stenosis, but most likely this is asymptomatic. Will check CT angiogram to further define exact degree of stenosis. Agree with medical management of risk factors including aspirin, statin, smoking cessation.   Dx:   1. TIA vs complicated migraine 2. Asymptomatic right ICA stenosis (~50-70%)  Other specified transient cerebral ischemias - Plan: CT Angio Neck W/Cm &/Or Wo/Cm  Complicated migraine - Plan: CT Angio Neck W/Cm &/Or Wo/Cm  Carotid artery stenosis, right - Plan: CT Angio Neck W/Cm &/Or Wo/Cm    PLAN: - CTA neck to better define severity of right ICA stenosis (~50-70% by carotid ultrasound) - continue aspirin and atorvastatin - smoking cessation reviewed  Orders Placed This Encounter  Procedures  . CT Angio Neck W/Cm &/Or Wo/Cm   Return in about 6 weeks (around 01/13/2015).    Penni Bombard, MD 9/37/3428, 7:68 PM Certified in Neurology, Neurophysiology and Neuroimaging  [facility] [address], [place], [place] [address] [phone]

## 2014-12-02 NOTE — Patient Instructions (Signed)
Thank you for coming to see [name] at [facility]. I hope we have been able to provide you high quality care today.  You may receive a patient satisfaction survey over the next few weeks. We would appreciate your feedback and comments so that we may continue to improve ourselves and the health of our patients.  - I will check CT angiogram neck - continue aspirin and atorvastatin - try to stop smoking cigarettes   ~~~~~~~~~~~~~~~~~~~~~~~~~~~~~~~~~~~~~~~~~~~~~~~~~~~~~~~~~~~~~~~~~  DR. Haroldine Redler'S GUIDE TO HAPPY AND HEALTHY LIVING These are some of my general health and wellness recommendations. Some of them may apply to you better than others. Please use common sense as you try these suggestions and feel free to ask me any questions.   ACTIVITY/FITNESS Mental, social, emotional and physical stimulation are very important for brain and body health. Try learning a new activity (arts, music, language, sports, games).  Keep moving your body to the best of your abilities. You can do this at home, inside or outside, the park, community center, gym or anywhere you like. Consider a physical therapist or personal trainer to get started. Consider the app Sworkit. Fitness trackers such as smart-watches, smart-phones or Fitbits can help as well.   NUTRITION Eat more plants: colorful vegetables, nuts, seeds and berries.  Eat less sugar, salt, preservatives and processed foods.  Avoid toxins such as cigarettes and alcohol.  Drink water when you are thirsty. Warm water with a slice of lemon is an excellent morning drink to start the day.  Consider these websites for more information The Nutrition Source (https://www.henry-hernandez.biz/) Precision Nutrition (WindowBlog.ch)   RELAXATION Consider practicing mindfulness meditation or other relaxation techniques such as deep breathing, prayer, yoga, tai chi, massage. See website mindful.org or  the apps Headspace or Calm to help get started.   SLEEP Try to get at least 7-8+ hours sleep per day. Regular exercise and reduced caffeine will help you sleep better. Practice good sleep hygeine techniques. See website sleep.org for more information.   PLANNING Prepare estate planning, living will, healthcare POA documents. Sometimes this is best planned with the help of an attorney. Theconversationproject.org and agingwithdignity.org are excellent resources.

## 2014-12-06 ENCOUNTER — Telehealth: Payer: Self-pay | Admitting: Diagnostic Neuroimaging

## 2014-12-06 NOTE — Telephone Encounter (Signed)
Patient called to advise she received phone call from [facility] to schedule CT Angio-neck w/CM or w/o CM. Patient advised caller that this has already been set up at [facility] because the [place] area was closer for her. Patient would like a phone call back to advise.

## 2014-12-06 NOTE — Telephone Encounter (Signed)
Spoke with patient who states [facility] verbalized understanding of her ct scan being done elsewhere. She verbalized appreciation for this call back.

## 2014-12-07 ENCOUNTER — Telehealth: Payer: Self-pay | Admitting: Diagnostic Neuroimaging

## 2014-12-07 DIAGNOSIS — G458 Other transient cerebral ischemic attacks and related syndromes: Secondary | ICD-10-CM

## 2014-12-07 DIAGNOSIS — I6521 Occlusion and stenosis of right carotid artery: Secondary | ICD-10-CM | POA: Diagnosis not present

## 2014-12-07 DIAGNOSIS — G43909 Migraine, unspecified, not intractable, without status migrainosus: Secondary | ICD-10-CM | POA: Diagnosis not present

## 2014-12-07 DIAGNOSIS — I6523 Occlusion and stenosis of bilateral carotid arteries: Secondary | ICD-10-CM | POA: Diagnosis not present

## 2014-12-07 NOTE — Telephone Encounter (Signed)
loranda with [facility] CT DEpt need orders for BUN and creatinine. Pt has appt today at 1pm for blood work. She can be reached at [phone] and fax [phone]

## 2014-12-07 NOTE — Telephone Encounter (Signed)
Orders entered, printed, signed by Dr Leta Baptist. Successfully faxed to [facility] CT Dept.

## 2014-12-09 DIAGNOSIS — H4312 Vitreous hemorrhage, left eye: Secondary | ICD-10-CM | POA: Diagnosis not present

## 2014-12-09 DIAGNOSIS — H43811 Vitreous degeneration, right eye: Secondary | ICD-10-CM | POA: Diagnosis not present

## 2014-12-09 DIAGNOSIS — H3561 Retinal hemorrhage, right eye: Secondary | ICD-10-CM | POA: Diagnosis not present

## 2014-12-09 DIAGNOSIS — H26493 Other secondary cataract, bilateral: Secondary | ICD-10-CM | POA: Diagnosis not present

## 2014-12-10 DIAGNOSIS — H4311 Vitreous hemorrhage, right eye: Secondary | ICD-10-CM | POA: Diagnosis not present

## 2014-12-10 DIAGNOSIS — H02403 Unspecified ptosis of bilateral eyelids: Secondary | ICD-10-CM | POA: Diagnosis not present

## 2014-12-10 DIAGNOSIS — H26493 Other secondary cataract, bilateral: Secondary | ICD-10-CM | POA: Diagnosis not present

## 2014-12-10 DIAGNOSIS — H43391 Other vitreous opacities, right eye: Secondary | ICD-10-CM | POA: Diagnosis not present

## 2014-12-10 DIAGNOSIS — H3561 Retinal hemorrhage, right eye: Secondary | ICD-10-CM | POA: Diagnosis not present

## 2014-12-10 DIAGNOSIS — H43811 Vitreous degeneration, right eye: Secondary | ICD-10-CM | POA: Diagnosis not present

## 2014-12-10 DIAGNOSIS — H5213 Myopia, bilateral: Secondary | ICD-10-CM | POA: Diagnosis not present

## 2014-12-10 DIAGNOSIS — Z961 Presence of intraocular lens: Secondary | ICD-10-CM | POA: Diagnosis not present

## 2014-12-17 ENCOUNTER — Telehealth: Payer: Self-pay | Admitting: Diagnostic Neuroimaging

## 2014-12-17 NOTE — Telephone Encounter (Signed)
I called patient with results. CT angiogram of the neck shows bilateral carotid bifurcation plaque and calcifications with no measurable hemodynamically significant stenosis. I recommend ongoing medical management and smoking cessation for patient. Patient understands test results and plan. I'll see her in follow-up in a few weeks. Test result was faxed to patient's PCP as requested.  Penni Bombard, MD 53/64/6803, 2:12 PM Certified in Neurology, Neurophysiology and Neuroimaging  [facility] [address], [place], [place] [address] [phone]

## 2014-12-17 NOTE — Telephone Encounter (Signed)
Patient is calling to get CT results. Patient would also like CT results sent to [facility] in [place]. Thank you.

## 2014-12-17 NOTE — Telephone Encounter (Signed)
Spoke with patient who states she had CT scan done on 12/07/14 at [facility]. She is asking for results. She also requests results be faxed to her PCP, Dr [name], [place], [place] [phone]. Informed her would send her request to Dr Leta Baptist who should have received scan results from hospital. Informed would call her back today if at all possible. She verbalized understanding, appreciation.

## 2014-12-18 DIAGNOSIS — L401 Generalized pustular psoriasis: Secondary | ICD-10-CM | POA: Diagnosis not present

## 2014-12-24 DIAGNOSIS — H9201 Otalgia, right ear: Secondary | ICD-10-CM | POA: Diagnosis not present

## 2014-12-24 DIAGNOSIS — L0103 Bullous impetigo: Secondary | ICD-10-CM | POA: Diagnosis not present

## 2014-12-28 DIAGNOSIS — R7301 Impaired fasting glucose: Secondary | ICD-10-CM | POA: Diagnosis not present

## 2014-12-28 DIAGNOSIS — E7800 Pure hypercholesterolemia, unspecified: Secondary | ICD-10-CM | POA: Diagnosis not present

## 2014-12-28 DIAGNOSIS — R634 Abnormal weight loss: Secondary | ICD-10-CM | POA: Diagnosis not present

## 2014-12-28 DIAGNOSIS — I6350 Cerebral infarction due to unspecified occlusion or stenosis of unspecified cerebral artery: Secondary | ICD-10-CM | POA: Diagnosis not present

## 2014-12-28 DIAGNOSIS — F331 Major depressive disorder, recurrent, moderate: Secondary | ICD-10-CM | POA: Diagnosis not present

## 2014-12-28 DIAGNOSIS — F5109 Other insomnia not due to a substance or known physiological condition: Secondary | ICD-10-CM | POA: Diagnosis not present

## 2014-12-28 DIAGNOSIS — G2581 Restless legs syndrome: Secondary | ICD-10-CM | POA: Diagnosis not present

## 2014-12-28 DIAGNOSIS — I69398 Other sequelae of cerebral infarction: Secondary | ICD-10-CM | POA: Diagnosis not present

## 2014-12-28 DIAGNOSIS — G43109 Migraine with aura, not intractable, without status migrainosus: Secondary | ICD-10-CM | POA: Diagnosis not present

## 2014-12-28 DIAGNOSIS — R630 Anorexia: Secondary | ICD-10-CM | POA: Diagnosis not present

## 2014-12-28 DIAGNOSIS — K5909 Other constipation: Secondary | ICD-10-CM | POA: Diagnosis not present

## 2014-12-28 DIAGNOSIS — E782 Mixed hyperlipidemia: Secondary | ICD-10-CM | POA: Diagnosis not present

## 2014-12-30 DIAGNOSIS — H43811 Vitreous degeneration, right eye: Secondary | ICD-10-CM | POA: Diagnosis not present

## 2014-12-30 DIAGNOSIS — H43391 Other vitreous opacities, right eye: Secondary | ICD-10-CM | POA: Diagnosis not present

## 2014-12-30 DIAGNOSIS — H26493 Other secondary cataract, bilateral: Secondary | ICD-10-CM | POA: Diagnosis not present

## 2014-12-30 DIAGNOSIS — H5213 Myopia, bilateral: Secondary | ICD-10-CM | POA: Diagnosis not present

## 2014-12-30 DIAGNOSIS — H4311 Vitreous hemorrhage, right eye: Secondary | ICD-10-CM | POA: Diagnosis not present

## 2014-12-30 DIAGNOSIS — H3561 Retinal hemorrhage, right eye: Secondary | ICD-10-CM | POA: Diagnosis not present

## 2014-12-30 DIAGNOSIS — Z961 Presence of intraocular lens: Secondary | ICD-10-CM | POA: Diagnosis not present

## 2014-12-30 DIAGNOSIS — H02403 Unspecified ptosis of bilateral eyelids: Secondary | ICD-10-CM | POA: Diagnosis not present

## 2014-12-31 DIAGNOSIS — C4401 Basal cell carcinoma of skin of lip: Secondary | ICD-10-CM | POA: Diagnosis not present

## 2014-12-31 DIAGNOSIS — L0100 Impetigo, unspecified: Secondary | ICD-10-CM | POA: Diagnosis not present

## 2014-12-31 DIAGNOSIS — L821 Other seborrheic keratosis: Secondary | ICD-10-CM | POA: Diagnosis not present

## 2015-01-04 ENCOUNTER — Encounter: Payer: Self-pay | Admitting: Diagnostic Neuroimaging

## 2015-01-04 DIAGNOSIS — H26492 Other secondary cataract, left eye: Secondary | ICD-10-CM | POA: Diagnosis not present

## 2015-01-14 DIAGNOSIS — L570 Actinic keratosis: Secondary | ICD-10-CM | POA: Diagnosis not present

## 2015-01-14 DIAGNOSIS — L0100 Impetigo, unspecified: Secondary | ICD-10-CM | POA: Diagnosis not present

## 2015-01-18 ENCOUNTER — Ambulatory Visit (INDEPENDENT_AMBULATORY_CARE_PROVIDER_SITE_OTHER): Payer: Medicare Other | Admitting: Diagnostic Neuroimaging

## 2015-01-18 ENCOUNTER — Encounter: Payer: Self-pay | Admitting: Diagnostic Neuroimaging

## 2015-01-18 VITALS — BP 110/62 | HR 67 | Ht 62.0 in | Wt 108.0 lb

## 2015-01-18 DIAGNOSIS — G43109 Migraine with aura, not intractable, without status migrainosus: Secondary | ICD-10-CM

## 2015-01-18 DIAGNOSIS — G458 Other transient cerebral ischemic attacks and related syndromes: Secondary | ICD-10-CM

## 2015-01-18 DIAGNOSIS — I6521 Occlusion and stenosis of right carotid artery: Secondary | ICD-10-CM

## 2015-01-18 DIAGNOSIS — G43909 Migraine, unspecified, not intractable, without status migrainosus: Secondary | ICD-10-CM

## 2015-01-18 NOTE — Progress Notes (Signed)
[facility]  PATIENT: Anne [name] DOB: Dec 15, 1945  REFERRING CLINICIAN: Olivia Canter, MD HISTORY FROM: patient  REASON FOR VISIT: follow up    HISTORICAL  CHIEF COMPLAINT:  Chief Complaint  Patient presents with  . Follow-up    In room 7 by herself. Here for f/u transient cerebral ischemia/migraine    HISTORY OF PRESENT ILLNESS:   UPDATE 01/18/15: Since last visit, no further TIA. CTA neck showed plaque but no sig stenosis. Still with intermittent word finding diff, but not like her attack on 10/30/14.  PRIOR HPI (12/02/14): 69 year old right-handed female with history of hypercholesteremia, migraine, anxiety, tobacco abuse, here for evaluation of TIA versus complicated migraine. 10/30/14 patient was watching television, when her friend At her on the phone. Patient replied by text and then the patient's friend asked her to call. Patient tried calling and speaking verbally but had trouble with her words. Patient was able to contact her son who lives next door who came to evaluate her. Patient had 15 minutes of language and speech disturbance. Patient was taken to the hospital and admitted for stroke/TIA workup. CT head showed no acute findings. She was recommended to have further testing but she requested to go home but she did not have a ride on Monday. Patient was discharged and had further workup by PCP. MRI brain showed no acute findings. Carotid ultrasound showed right internal carotid artery stenosis possibly 50-70%. Left internal carotid artery had no stenosis. Echocardiogram was unremarkable. Patient was on aspirin 81 mg when this event occurred. She was switched to Plavix by PCP but patient could not tolerate it. Now patient is on aspirin 325 mg daily. No further recurrent events. Patient also has history of migraine headache starting in her 68s. She describes right-sided throbbing severe headaches with nausea, photophobia, phonophobia. No food triggers. Stress seems to  aggravate her headaches. No warning or prodromal aura symptoms. Patient averages 2-3 migraines per month. She is on topiramate and sumatriptan for headache management. October 2015 patient had a concussion with resultant stuttering speech and language difficulty. The symptoms have continued up until today.   REVIEW OF SYSTEMS: Full 14 system review of systems performed and notable only for weight loss blurred vision double vision memory loss confusion headache weakness dizziness passing out insomnia restless legs anxiety not asleep change in appetite allergies runny nose moles.  ALLERGIES: Allergies  Allergen Reactions  . Clindamycin/Lincomycin Nausea And Vomiting  . Gabapentin Other (See Comments)    Personality change  . Lyrica [Pregabalin]     Anxiety, depression, suicidal thoughts, personality changes  . Rocephin [Ceftriaxone Sodium In Dextrose] Rash    HOME MEDICATIONS: Outpatient Prescriptions Prior to Visit  Medication Sig Dispense Refill  . Aclidinium Bromide (TUDORZA PRESSAIR) 400 MCG/ACT AEPB Inhale into the lungs.    Marland Kitchen aspirin 325 MG EC tablet Take 325 mg by mouth daily.    Marland Kitchen atorvastatin (LIPITOR) 40 MG tablet Take 40 mg by mouth.    Marland Kitchen BREO ELLIPTA 100-25 MCG/INH AEPB     . Cholecalciferol (VITAMIN D3) 50000 UNITS CAPS Take by mouth.    . Eszopiclone (ESZOPICLONE) 3 MG TABS Take 3 mg by mouth at bedtime. Take immediately before bedtime    . fenofibrate 160 MG tablet Take 160 mg by mouth.    Marland Kitchen HYDROcodone-acetaminophen (NORCO/VICODIN) 5-325 MG tablet Take by mouth.    . hydrOXYzine (ATARAX/VISTARIL) 50 MG tablet Take 50 mg by mouth.    . montelukast (SINGULAIR) 10 MG tablet Take 10  mg by mouth at bedtime.    Marland Kitchen PARoxetine (PAXIL) 40 MG tablet Take 40 mg by mouth every morning.    . pramipexole (MIRAPEX) 0.5 MG tablet Take 0.5 mg by mouth 3 (three) times daily. Two at bedtime    . SUMAtriptan (IMITREX) 100 MG tablet Take 100 mg by mouth.    . topiramate (TOPAMAX) 50 MG tablet  Take 50 mg by mouth 2 (two) times daily.    . traZODone (DESYREL) 100 MG tablet Take 200 mg by mouth.    . VENTOLIN HFA 108 (90 BASE) MCG/ACT inhaler      No facility-administered medications prior to visit.    PAST MEDICAL HISTORY: Past Medical History  Diagnosis Date  . Hyperlipidemia   . Asthma   . Migraine   . RLS (restless legs syndrome)   . COPD (chronic obstructive pulmonary disease) ([place])   . Insomnia   . Kidney stone   . Depression     PAST SURGICAL HISTORY: Past Surgical History  Procedure Laterality Date  . Knee arthroscopy Left   . Appendectomy  2000  . Abdominal hysterectomy  2000  . Cataract extraction Left 09/2013  . Nose surgery      x 3  . Carpel tunnel surgery Bilateral   . Foot surgery Left   . Breast biopsy Left     benign    FAMILY HISTORY: Family History  Problem Relation Age of Onset  . Cancer Mother     cervical  . Cancer Maternal Grandfather   . Heart disease Father   . Cancer Maternal Grandmother     SOCIAL HISTORY:  Social History   Social History  . Marital Status: Widowed    Spouse Name: N/A  . Number of Children: 1  . Years of Education: 12   Occupational History  .      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 Smoking status: Current Every Day Smoker -- 0.50 packs/day for 50 years    Types: Cigarettes  . Smokeless tobacco: Not on file  . Alcohol Use: No  . Drug Use: No  . Sexual Activity: Not on file   Other Topics Concern  . Not on file   Social History Narrative   Lives alone, widow   Caffeine use- tea, 1 cup daily     PHYSICAL EXAM  GENERAL EXAM/CONSTITUTIONAL: Vitals:  Filed Vitals:   01/18/15 1042 01/18/15 1056  BP: 103/48 110/62  Pulse: 67   Height: 5\' 2"  (1.575 m)   Weight: 108 lb (48.988 kg)    Body mass index is 19.75 kg/(m^2). No exam data present  Patient is in no distress; well developed, nourished and groomed; neck is supple  CARDIOVASCULAR:  Examination of  carotid arteries is normal; no carotid bruits  Regular rate and rhythm, no murmurs  Examination of peripheral vascular system by observation and palpation is normal  EYES:  Ophthalmoscopic exam of optic discs and posterior segments is normal; no papilledema or hemorrhages  MUSCULOSKELETAL:  Gait, strength, tone, movements noted in Neurologic exam below  NEUROLOGIC: MENTAL STATUS:  No flowsheet data found.  awake, alert, oriented to person, place and time  recent and remote memory intact  normal attention and concentration  language fluent, comprehension intact, naming intact,   fund of knowledge appropriate  CRANIAL NERVE:   2nd - no papilledema on fundoscopic exam  2nd, 3rd, 4th, 6th - pupils equal and reactive to light, visual fields full to confrontation,  extraocular muscles intact, no nystagmus  5th - facial sensation symmetric  7th - facial strength symmetric  8th - hearing intact  9th - palate elevates symmetrically, uvula midline  11th - shoulder shrug symmetric  12th - tongue protrusion midline  MOTOR:   normal bulk and tone, full strength in the BUE, BLE  SENSORY:   normal and symmetric to light touch, temperature, vibration  COORDINATION:   finger-nose-finger, fine finger movements normal  REFLEXES:   deep tendon reflexes TRACE and symmetric  GAIT/STATION:   narrow based gait; able to walk tandem; romberg is negative    DIAGNOSTIC DATA (LABS, IMAGING, TESTING) - I reviewed patient records, labs, notes, testing and imaging myself where available.  Lab Results  Component Value Date   WBC 8.3 03/19/2008   HGB 15.0 03/19/2008   HCT 44.0 03/19/2008   MCV 90.1 03/19/2008   PLT 224 03/19/2008      Component Value Date/Time   NA 138 03/19/2008 1142   K 4.2 03/19/2008 1142   CL 106 03/19/2008 1142   GLUCOSE 87 03/19/2008 1142   BUN 17 03/19/2008 1142   CREATININE 0.6 03/19/2008 1142   No results found for: CHOL, HDL, LDLCALC,  LDLDIRECT, TRIG, CHOLHDL No results found for: HGBA1C No results found for: VITAMINB12 No results found for: TSH   11/19/14 MRI brain  - No acute intracranial findings. No evidence for recent infarction. Unremarkable intracranial MRA.  11/15/14 Carotid u/s  - right internal carotid artery: Heterogeneous and partially calcified plaque with 50-70% stenosis - left internal carotid artery: No significant stenosis  11/17/14 TTE  - Normal left ventricular size function - No cardiac source of emboli  104/16 CTA neck  - bilateral carotid bifurcation plaque and calcifications with no measurable hemodynamically significant stenosis.    ASSESSMENT AND PLAN  69 y.o. year old female here with suspected left brain TIA vs complicated migraine. Also found to have right ICA stenosis on carotid u/s, but CTA neck showed plaque without stenosis.    Dx:   Other specified transient cerebral ischemias  Complicated migraine    PLAN: - medical management of risk factors including aspirin, statin, smoking cessation - sleep hygeine and mood issues reviewed - stop sumatriptan (due to TIA/stroke risk) and increase topiramate to 100mg  BID  Return if symptoms worsen or fail to improve, for return to PCP.    Penni Bombard, MD [id number], [id number] AM Certified in Neurology, Neurophysiology and Neuroimaging  [facility] [address], [place],  [address] [phone]

## 2015-01-18 NOTE — Patient Instructions (Signed)
Thank you for coming to see [name] at [facility]. I hope we have been able to provide you high quality care today.  You may receive a patient satisfaction survey over the next few weeks. We would appreciate your feedback and comments so that we may continue to improve ourselves and the health of our patients.  - continue aspirin and statin - work on sleep hygeine - consider psychology/psychiatry evaluation for depression/anxiety/insomnia - stop sumatriptan - increase topiramate to 123m twice a day   ~~~~~~~~~~~~~~~~~~~~~~~~~~~~~~~~~~~~~~~~~~~~~~~~~~~~~~~~~~~~~~~~~  DR. PENUMALLI'S GUIDE TO HAPPY AND HEALTHY LIVING These are some of my general health and wellness recommendations. Some of them may apply to you better than others. Please use common sense as you try these suggestions and feel free to ask me any questions.   ACTIVITY/FITNESS Mental, social, emotional and physical stimulation are very important for brain and body health. Try learning a new activity (arts, music, language, sports, games).  Keep moving your body to the best of your abilities. You can do this at home, inside or outside, the park, community center, gym or anywhere you like. Consider a physical therapist or personal trainer to get started. Consider the app Sworkit. Fitness trackers such as smart-watches, smart-phones or Fitbits can help as well.   NUTRITION Eat more plants: colorful vegetables, nuts, seeds and berries.  Eat less sugar, salt, preservatives and processed foods.  Avoid toxins such as cigarettes and alcohol.  Drink water when you are thirsty. Warm water with a slice of lemon is an excellent morning drink to start the day.  Consider these websites for more information The Nutrition Source (hhttps://www.henry-hernandez.biz/ Precision Nutrition (wWindowBlog.ch   RELAXATION Consider practicing mindfulness meditation or other relaxation  techniques such as deep breathing, prayer, yoga, tai chi, massage. See website mindful.org or the apps Headspace or Calm to help get started.   SLEEP Try to get at least 7-8+ hours sleep per day. Regular exercise and reduced caffeine will help you sleep better. Practice good sleep hygeine techniques. See website sleep.org for more information.   PLANNING Prepare estate planning, living will, healthcare POA documents. Sometimes this is best planned with the help of an attorney. Theconversationproject.org and agingwithdignity.org are excellent resources.

## 2015-01-19 DIAGNOSIS — H26491 Other secondary cataract, right eye: Secondary | ICD-10-CM | POA: Diagnosis not present

## 2015-01-20 DIAGNOSIS — L570 Actinic keratosis: Secondary | ICD-10-CM | POA: Diagnosis not present

## 2015-02-01 DIAGNOSIS — Z1231 Encounter for screening mammogram for malignant neoplasm of breast: Secondary | ICD-10-CM | POA: Diagnosis not present

## 2015-02-01 DIAGNOSIS — E559 Vitamin D deficiency, unspecified: Secondary | ICD-10-CM | POA: Diagnosis not present

## 2015-02-01 DIAGNOSIS — R634 Abnormal weight loss: Secondary | ICD-10-CM | POA: Diagnosis not present

## 2015-02-01 DIAGNOSIS — R630 Anorexia: Secondary | ICD-10-CM | POA: Diagnosis not present

## 2015-02-01 DIAGNOSIS — K5909 Other constipation: Secondary | ICD-10-CM | POA: Diagnosis not present

## 2015-02-01 DIAGNOSIS — F5109 Other insomnia not due to a substance or known physiological condition: Secondary | ICD-10-CM | POA: Diagnosis not present

## 2015-02-01 DIAGNOSIS — E782 Mixed hyperlipidemia: Secondary | ICD-10-CM | POA: Diagnosis not present

## 2015-02-14 DIAGNOSIS — H26493 Other secondary cataract, bilateral: Secondary | ICD-10-CM | POA: Diagnosis not present

## 2015-02-14 DIAGNOSIS — H4311 Vitreous hemorrhage, right eye: Secondary | ICD-10-CM | POA: Diagnosis not present

## 2015-02-14 DIAGNOSIS — H43811 Vitreous degeneration, right eye: Secondary | ICD-10-CM | POA: Diagnosis not present

## 2015-02-14 DIAGNOSIS — H02403 Unspecified ptosis of bilateral eyelids: Secondary | ICD-10-CM | POA: Diagnosis not present

## 2015-02-14 DIAGNOSIS — H5213 Myopia, bilateral: Secondary | ICD-10-CM | POA: Diagnosis not present

## 2015-02-14 DIAGNOSIS — Z961 Presence of intraocular lens: Secondary | ICD-10-CM | POA: Diagnosis not present

## 2015-02-14 DIAGNOSIS — H3561 Retinal hemorrhage, right eye: Secondary | ICD-10-CM | POA: Diagnosis not present

## 2015-02-14 DIAGNOSIS — H43391 Other vitreous opacities, right eye: Secondary | ICD-10-CM | POA: Diagnosis not present

## 2015-02-17 DIAGNOSIS — H43811 Vitreous degeneration, right eye: Secondary | ICD-10-CM | POA: Diagnosis not present

## 2015-02-17 DIAGNOSIS — H4312 Vitreous hemorrhage, left eye: Secondary | ICD-10-CM | POA: Diagnosis not present

## 2015-02-17 DIAGNOSIS — H04123 Dry eye syndrome of bilateral lacrimal glands: Secondary | ICD-10-CM | POA: Diagnosis not present

## 2015-02-17 DIAGNOSIS — H3561 Retinal hemorrhage, right eye: Secondary | ICD-10-CM | POA: Diagnosis not present

## 2015-02-18 DIAGNOSIS — Z1231 Encounter for screening mammogram for malignant neoplasm of breast: Secondary | ICD-10-CM | POA: Diagnosis not present

## 2015-02-18 DIAGNOSIS — M8589 Other specified disorders of bone density and structure, multiple sites: Secondary | ICD-10-CM | POA: Diagnosis not present

## 2015-02-18 DIAGNOSIS — Z780 Asymptomatic menopausal state: Secondary | ICD-10-CM | POA: Diagnosis not present

## 2015-02-18 LAB — HM DEXA SCAN

## 2015-02-18 LAB — HM MAMMOGRAPHY

## 2015-02-24 ENCOUNTER — Encounter: Payer: Self-pay | Admitting: Family Medicine

## 2015-02-24 DIAGNOSIS — R921 Mammographic calcification found on diagnostic imaging of breast: Secondary | ICD-10-CM | POA: Diagnosis not present

## 2015-03-03 DIAGNOSIS — R634 Abnormal weight loss: Secondary | ICD-10-CM | POA: Diagnosis not present

## 2015-03-03 DIAGNOSIS — F5109 Other insomnia not due to a substance or known physiological condition: Secondary | ICD-10-CM | POA: Diagnosis not present

## 2015-03-03 DIAGNOSIS — Z681 Body mass index (BMI) 19 or less, adult: Secondary | ICD-10-CM | POA: Diagnosis not present

## 2015-03-03 DIAGNOSIS — E782 Mixed hyperlipidemia: Secondary | ICD-10-CM | POA: Diagnosis not present

## 2015-03-03 DIAGNOSIS — R600 Localized edema: Secondary | ICD-10-CM | POA: Diagnosis not present

## 2015-03-09 DIAGNOSIS — N6092 Unspecified benign mammary dysplasia of left breast: Secondary | ICD-10-CM | POA: Diagnosis not present

## 2015-03-09 DIAGNOSIS — N6012 Diffuse cystic mastopathy of left breast: Secondary | ICD-10-CM | POA: Diagnosis not present

## 2015-03-09 DIAGNOSIS — R921 Mammographic calcification found on diagnostic imaging of breast: Secondary | ICD-10-CM | POA: Diagnosis not present

## 2015-03-18 DIAGNOSIS — N6089 Other benign mammary dysplasias of unspecified breast: Secondary | ICD-10-CM | POA: Diagnosis not present

## 2015-03-18 DIAGNOSIS — N63 Unspecified lump in breast: Secondary | ICD-10-CM | POA: Diagnosis not present

## 2015-03-18 DIAGNOSIS — N6489 Other specified disorders of breast: Secondary | ICD-10-CM | POA: Diagnosis not present

## 2015-03-18 DIAGNOSIS — N6092 Unspecified benign mammary dysplasia of left breast: Secondary | ICD-10-CM | POA: Diagnosis not present

## 2015-03-28 DIAGNOSIS — C50212 Malignant neoplasm of upper-inner quadrant of left female breast: Secondary | ICD-10-CM | POA: Diagnosis not present

## 2015-03-28 DIAGNOSIS — N63 Unspecified lump in breast: Secondary | ICD-10-CM | POA: Diagnosis not present

## 2015-04-11 ENCOUNTER — Other Ambulatory Visit: Payer: Self-pay | Admitting: General Surgery

## 2015-04-11 DIAGNOSIS — C50212 Malignant neoplasm of upper-inner quadrant of left female breast: Secondary | ICD-10-CM

## 2015-04-12 DIAGNOSIS — E782 Mixed hyperlipidemia: Secondary | ICD-10-CM | POA: Diagnosis not present

## 2015-04-12 DIAGNOSIS — R7301 Impaired fasting glucose: Secondary | ICD-10-CM | POA: Diagnosis not present

## 2015-04-13 ENCOUNTER — Telehealth: Payer: Self-pay | Admitting: Genetic Counselor

## 2015-04-13 NOTE — Telephone Encounter (Signed)
genetic appt-s/w patient and gave genetic appt for 02/21 @ 9 w/kayla [employer]

## 2015-04-15 DIAGNOSIS — C50212 Malignant neoplasm of upper-inner quadrant of left female breast: Secondary | ICD-10-CM

## 2015-04-15 HISTORY — DX: Malignant neoplasm of upper-inner quadrant of left female breast: C50.212

## 2015-04-18 ENCOUNTER — Ambulatory Visit: Payer: Self-pay | Admitting: Hematology and Oncology

## 2015-04-18 DIAGNOSIS — C50212 Malignant neoplasm of upper-inner quadrant of left female breast: Secondary | ICD-10-CM | POA: Diagnosis not present

## 2015-04-18 DIAGNOSIS — R922 Inconclusive mammogram: Secondary | ICD-10-CM | POA: Diagnosis not present

## 2015-04-26 ENCOUNTER — Other Ambulatory Visit: Payer: Medicare Other

## 2015-04-26 ENCOUNTER — Encounter: Payer: Self-pay | Admitting: Genetic Counselor

## 2015-04-26 ENCOUNTER — Ambulatory Visit (HOSPITAL_BASED_OUTPATIENT_CLINIC_OR_DEPARTMENT_OTHER): Payer: Medicare Other | Admitting: Genetic Counselor

## 2015-04-26 DIAGNOSIS — Z86010 Personal history of colonic polyps: Secondary | ICD-10-CM

## 2015-04-26 DIAGNOSIS — Z8049 Family history of malignant neoplasm of other genital organs: Secondary | ICD-10-CM

## 2015-04-26 DIAGNOSIS — Z803 Family history of malignant neoplasm of breast: Secondary | ICD-10-CM | POA: Insufficient documentation

## 2015-04-26 DIAGNOSIS — Z315 Encounter for genetic counseling: Secondary | ICD-10-CM

## 2015-04-26 DIAGNOSIS — C50412 Malignant neoplasm of upper-outer quadrant of left female breast: Secondary | ICD-10-CM | POA: Diagnosis not present

## 2015-04-26 DIAGNOSIS — Z801 Family history of malignant neoplasm of trachea, bronchus and lung: Secondary | ICD-10-CM | POA: Diagnosis not present

## 2015-04-26 DIAGNOSIS — Z800 Family history of malignant neoplasm of digestive organs: Secondary | ICD-10-CM

## 2015-04-26 DIAGNOSIS — C50919 Malignant neoplasm of unspecified site of unspecified female breast: Secondary | ICD-10-CM | POA: Insufficient documentation

## 2015-04-26 DIAGNOSIS — C50212 Malignant neoplasm of upper-inner quadrant of left female breast: Secondary | ICD-10-CM | POA: Diagnosis present

## 2015-04-26 DIAGNOSIS — Z808 Family history of malignant neoplasm of other organs or systems: Secondary | ICD-10-CM | POA: Diagnosis not present

## 2015-04-26 DIAGNOSIS — Z809 Family history of malignant neoplasm, unspecified: Secondary | ICD-10-CM | POA: Diagnosis not present

## 2015-04-26 DIAGNOSIS — Z85828 Personal history of other malignant neoplasm of skin: Secondary | ICD-10-CM

## 2015-04-26 HISTORY — DX: Family history of malignant neoplasm of breast: Z80.3

## 2015-04-26 HISTORY — DX: Family history of malignant neoplasm of digestive organs: Z80.0

## 2015-04-26 HISTORY — DX: Personal history of other malignant neoplasm of skin: Z85.828

## 2015-04-26 NOTE — Progress Notes (Signed)
REFERRING PROVIDER: Stark Klein, MD  PRIMARY PROVIDER:  [name], MD  PRIMARY REASON FOR VISIT:  1. Malignant neoplasm of upper-inner quadrant of left female breast ([place])   2. History of basal cell carcinoma   3. Family history of breast cancer in female   38. Family history of colon cancer   5. Family history of cervical cancer   6. Family history of brain cancer   7. Family history of lung cancer   8. Family history of cancer   9. History of colonic polyps      HISTORY OF PRESENT ILLNESS:   Anne [name], a 70 y.o. female, was seen for a [place] cancer genetics consultation at the request of Dr. Barry Dienes due to a personal history of breast cancer and family history of breast and other cancers.  Anne [name] presents to clinic today to discuss the possibility of a hereditary predisposition to cancer, genetic testing, and to further clarify her future cancer risks, as well as potential cancer risks for family members.   In February 2017, at the age of 89, Anne [name] was diagnosed with invasive ductal carcinoma of the left breast.  Hormone receptor status was ER/PR+, Her2-.  This will be treated with bilateral mastectomies, scheduled for June 02, 2015.  Anne [name] also has a history of basal cell carcinoma on her face, diagnosed last year at the age of 26.     CANCER HISTORY:   No history exists.     HORMONAL RISK FACTORS:  Menarche was at age 22.  First live birth at age 61.  OCP use for approximately 2 years.  Ovaries intact: total hysterectomy at 43y (?ovaries and fallopian tubes) Hysterectomy: yes, at 63 for bleeding/erratic periods  Menopausal status: postmenopausal.  HRT use: 2 years. Colonoscopy: yes; most recent was 3-4 years ago in [place] (Dr. Lyda Jester); history of multiple colon polyps (unsure of how many). Mammogram within the last year: yes. Number of breast biopsies: 3-4, hx of one benign left breast lumpectomy (2 years ago). Up to date with pelvic  exams:  yes. 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 Hyperlipidemia   . Asthma   . Migraine   . RLS (restless legs syndrome)   . COPD (chronic obstructive pulmonary disease) ([place])   . Insomnia   . Kidney stone   . Depression     Past Surgical History  Procedure Laterality Date  . Knee arthroscopy Left   . Appendectomy  2000  . Abdominal hysterectomy  2000  . Cataract extraction Left 09/2013  . Nose surgery      x 3  . Carpel tunnel surgery Bilateral   . Foot surgery Left   . Breast biopsy Left     benign    Social History   Social History  . Marital Status: Widowed    Spouse Name: N/A  . Number of Children: 1  . Years of Education: 12   Occupational History  .      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 Smoking status: Current Every Day Smoker -- 0.50 packs/day for 50 years    Types: Cigarettes  . Smokeless  tobacco: Never Used     Comment: will quit on 3/1 for surgery on 06/02/15  . Alcohol Use: 0.0 oz/week    0 Standard drinks or equivalent per week     Comment: very occ (maybe 3 glasses a yr)  . Drug Use: No  . Sexual Activity: Not Asked   Other Topics Concern  . None   Social History Narrative   Lives alone, widow   Caffeine use- tea, 1 cup daily     FAMILY HISTORY:  We obtained a detailed, 4-generation family history.  Significant diagnoses are listed below: Family History  Problem Relation Age of Onset  . Cervical cancer Mother 31  . Colon cancer Maternal Grandfather     mets to stomach; dx. 67-70  . Heart disease Father   . Prostate cancer Father 70  . Cervical cancer Maternal Grandmother     dx. 40s; treated  with radium implant  . Lung cancer Sister 71    maternal half-sister dx. lung cancer, stage III  . Breast cancer Maternal Aunt   . Cervical cancer Sister 44    paternal half-sister; s/p TAH  . Spina bifida Grandchild   . Brain cancer Grandchild     grandson dx. at 20 mos, treated at [facility]  . Breast cancer Other 51    maternal half-sister's daughter  . Cancer Maternal Aunt     d. 84; unspecified type - "began at back area and moved to vital organs"  . Cancer Maternal Uncle      late 70s; unspecified type; "started at back and moved to vital organs"    [name] has one son, who is currently 42 and has never been diagnosed with cancer.  He has a 21- year old daughter from a first partnership; this daughter has spina bifida.  He also has three sons, ages 9, 5, and 20 months from a second partnership.  His 20-month old son was born with a congenital heart anomaly, which Ms. Oshiro describes as absence of the left side of his heart.  This grandson was also recently diagnosed with an unspecified type of brain cancer.  [name]'s grandson is being treated at [facility].  [name] is not sure if there is any cancer on her grandson's mother's side of the family.  [name] has no full siblings.  She has one maternal half-sister who was diagnosed with stage III lung cancer recently at the age of 71.  This half-sister has three daughters and one son--one daughter was recently diagnosed with breast cancer at the age of 51.  [name] also has three paternal half-sisters, the oldest of which is 54 and has a history of cervical cancer which was diagnosed at age 44.    [name]'s mother died of cervical cancer at the age of 32.  She had two full sisters and two full brothers, all of whom have passed away.  One of these maternal aunts died of breast cancer in her late 60s.  The other maternal aunt died of an unspecified type of cancer at 84 which "started at her back area and went to her vital organs".   One maternal uncle died of a similar type of cancer in his late 70s.  The second maternal uncle died of cirrhosis in his 60s.  These aunts and uncles only had sons, and none of these sones have been diagnosed with cancer to [name]'s knowledge.  Ms. Rister's maternal grandmother died at the age of 92.    She had a history of cervical cancer which was diagnosed in her 78s and treated with a radium implant.  Ms. Higby maternal grandfather died of metastatic colon cancer in his late 36s-70.  She has limited information for her maternal great aunts/uncles and great grandparents.  Ms. Schindel father died of prostate cancer at the age of 23.  Ms. Frater was adopted by a relative, so she has no further information for any other paternal relatives.    Patient's maternal ancestors are of [name] descent, and paternal ancestors are of [place] descent. There is no reported Ashkenazi Jewish ancestry. There known consanguinity--Ms. Abella reports that her mother and father were cousins, but she does not know what degree cousins, or through which relatives they are related.    GENETIC COUNSELING ASSESSMENT: Anne [name] is a 70 y.o. female with a personal and family history of cancer which is somewhat suggestive of a hereditary cancer syndrome and predisposition to cancer. We, therefore, discussed and recommended the following at today's visit.   DISCUSSION: We reviewed the characteristics, features and inheritance patterns of hereditary cancer syndromes, particularly those caused by mutations within the BRCA1/2, TP53, CHEK2, and Lynch syndrome genes. We also discussed genetic testing, including the appropriate family members to test, the process of testing, insurance coverage and turn-around-time for results. We discussed the implications of a negative, positive and/or variant of uncertain significant result. We recommended Ms. Cryder pursue genetic testing for the 20-gene Breast/Ovarian Cancer Panel  through [facility].  The Breast/Ovarian Cancer Panel offered by [facility] Hope Pigeon, MD) includes sequencing and deletion/duplication analysis for the following 19 genes:  ATM, BARD1, BRCA1, BRCA2, BRIP1, CDH1, CHEK2, FANCC, MLH1, MSH2, MSH6, NBN, PALB2, PMS2, PTEN, RAD51C, RAD51D, TP53, and XRCC2.  This panel also includes deletion/duplication analysis (without sequencing) for one gene, EPCAM.  Based on Ms. Rabel's personal and family history of cancer, she meets medical criteria for genetic testing. Despite that she meets criteria, she may still have an out of pocket cost. We discussed that if her out of pocket cost for testing is over $100, the laboratory will call and confirm whether she wants to proceed with testing.  If the out of pocket cost of testing is less than $100 she will be billed by the genetic testing laboratory.   PLAN: After considering the risks, benefits, and limitations, Ms. Pendry  provided informed consent to pursue genetic testing and the blood sample was sent to [facility] for analysis of the 20-gene Breast/Ovarian Cancer Panel. Results should be available within approximately 2-3 weeks' time, at which point they will be disclosed by telephone to Ms. Sparling, as will any additional recommendations warranted by these results. Ms. Kief will receive a summary of her genetic counseling visit and a copy of her results once available. This information will also be available in Epic. We encouraged Ms. Swart to remain in contact with cancer genetics annually so that we can continuously update the family history and inform her of any changes in cancer genetics and testing that may be of benefit for her family. Ms. Zirkle questions were answered to her satisfaction today. Our contact information was provided should additional questions or concerns arise.  Thank you for the referral and allowing [name] to share in the care of your patient.   Jeanine Luz,  MS Genetic Counselor [email or web address] Phone: [phone]  The patient was seen for a total of 60 minutes in face-to-face genetic counseling.  This patient was discussed with Drs. Magrinat, Lindi Adie and/or [employer]  who agrees with the above.    _______________________________________________________________________ For Office Staff:  Number of people involved in session: 1 Was an Intern/ student involved with case: no    

## 2015-04-28 DIAGNOSIS — F17210 Nicotine dependence, cigarettes, uncomplicated: Secondary | ICD-10-CM | POA: Diagnosis not present

## 2015-04-28 DIAGNOSIS — C50412 Malignant neoplasm of upper-outer quadrant of left female breast: Secondary | ICD-10-CM | POA: Diagnosis not present

## 2015-04-28 DIAGNOSIS — C50212 Malignant neoplasm of upper-inner quadrant of left female breast: Secondary | ICD-10-CM | POA: Diagnosis not present

## 2015-04-28 DIAGNOSIS — M8580 Other specified disorders of bone density and structure, unspecified site: Secondary | ICD-10-CM | POA: Diagnosis not present

## 2015-04-28 DIAGNOSIS — F329 Major depressive disorder, single episode, unspecified: Secondary | ICD-10-CM | POA: Diagnosis not present

## 2015-04-28 DIAGNOSIS — M1990 Unspecified osteoarthritis, unspecified site: Secondary | ICD-10-CM | POA: Diagnosis not present

## 2015-04-28 DIAGNOSIS — Z170 Estrogen receptor positive status [ER+]: Secondary | ICD-10-CM | POA: Diagnosis not present

## 2015-04-28 DIAGNOSIS — Z803 Family history of malignant neoplasm of breast: Secondary | ICD-10-CM | POA: Diagnosis not present

## 2015-04-28 DIAGNOSIS — F419 Anxiety disorder, unspecified: Secondary | ICD-10-CM | POA: Diagnosis not present

## 2015-04-28 DIAGNOSIS — Z79899 Other long term (current) drug therapy: Secondary | ICD-10-CM | POA: Diagnosis not present

## 2015-04-28 DIAGNOSIS — E559 Vitamin D deficiency, unspecified: Secondary | ICD-10-CM | POA: Diagnosis not present

## 2015-05-04 DIAGNOSIS — C50919 Malignant neoplasm of unspecified site of unspecified female breast: Secondary | ICD-10-CM

## 2015-05-04 HISTORY — DX: Malignant neoplasm of unspecified site of unspecified female breast: C50.919

## 2015-05-12 DIAGNOSIS — Z1211 Encounter for screening for malignant neoplasm of colon: Secondary | ICD-10-CM | POA: Diagnosis not present

## 2015-05-12 DIAGNOSIS — J208 Acute bronchitis due to other specified organisms: Secondary | ICD-10-CM | POA: Diagnosis not present

## 2015-05-12 DIAGNOSIS — Z681 Body mass index (BMI) 19 or less, adult: Secondary | ICD-10-CM | POA: Diagnosis not present

## 2015-05-12 DIAGNOSIS — Z0001 Encounter for general adult medical examination with abnormal findings: Secondary | ICD-10-CM | POA: Diagnosis not present

## 2015-05-12 DIAGNOSIS — J018 Other acute sinusitis: Secondary | ICD-10-CM | POA: Diagnosis not present

## 2015-05-16 ENCOUNTER — Telehealth: Payer: Self-pay | Admitting: Genetic Counselor

## 2015-05-16 NOTE — Telephone Encounter (Signed)
Discussed with Ms. Anne [name] that her genetic test results were negative for pathogenic mutations within any of 20 genes on the Breast/Ovarian Cancer Panel.  Additionally, no uncertain changes were found.  Discussed that, though we cannot totally rule out a genetic cause for the history of cancer, that this is likely a reassuring result for [name].  Most of the 'young' cancers in the family history are cervical cancers, which we reviewed are typically not associated with a genetic cause.  The breast cancers in the family, including Ms. Anne [name]'s were all diagnosed over the age of 76.  Discussed that Ms. Anne [name] should continue to follow her doctor's recommendations for future cancer screening.  Women in the family are still considered to be at a somewhat increased risk for breast cancer due to the history, they should begin annual mammogram screening at the age of 22, they should also have annual clinical breast exams, and perform regular self breast exams.  Encouraged Ms. Anne [name] to call and update the family history with [name], if anyone else is diagnosed with cancer in the family.  Ms. Anne [name] is happy to receive this news.  She is welcome to call or email with any questions she may have.

## 2015-05-17 ENCOUNTER — Ambulatory Visit: Payer: Self-pay | Admitting: Genetic Counselor

## 2015-05-17 DIAGNOSIS — Z809 Family history of malignant neoplasm, unspecified: Secondary | ICD-10-CM

## 2015-05-17 DIAGNOSIS — C50212 Malignant neoplasm of upper-inner quadrant of left female breast: Secondary | ICD-10-CM

## 2015-05-17 DIAGNOSIS — Z803 Family history of malignant neoplasm of breast: Secondary | ICD-10-CM

## 2015-05-17 DIAGNOSIS — Z800 Family history of malignant neoplasm of digestive organs: Secondary | ICD-10-CM

## 2015-05-17 DIAGNOSIS — Z85828 Personal history of other malignant neoplasm of skin: Secondary | ICD-10-CM

## 2015-05-17 DIAGNOSIS — Z1379 Encounter for other screening for genetic and chromosomal anomalies: Secondary | ICD-10-CM

## 2015-05-18 DIAGNOSIS — C50212 Malignant neoplasm of upper-inner quadrant of left female breast: Secondary | ICD-10-CM | POA: Diagnosis not present

## 2015-05-18 DIAGNOSIS — R922 Inconclusive mammogram: Secondary | ICD-10-CM | POA: Diagnosis not present

## 2015-05-18 NOTE — H&P (Signed)
  Subjective:    Patient ID: Anne [name] is a 70 y.o. female.  HPI  Here for follow up discussion breast reconstruction. Recommended to patient by. AWynetta Emery PA-C. Presented following screening MMG with calcifications left UOQ. Biopsy with ADH and second biopsy in area benign. MRI with extremely dense tissue and 13 mm mass at 01-1129 position with extensive background enhancement. Second look [name] with 9 mm mass, biopsy with IDC ER/PR +, Her 2 -. Oncology at [facility] with Dr. Hinton Rao - started on tamoxifen and tolerating. No notes available but she states she was told no chemotherapy or radiation would be necessary. Per patient, did not discuss oncotype.   Genetics negative-patient still desires bilateral mastectomy.  Current 40 D Wt down 30 lbs since death of husband 2 years ago. Reports her grandchild is receiving radiation at [place] her adult granddaughter that would help her post surgery.  Stopped smoking March 1. Currently on antibiotics for URI/sinus infection and has 4 days left.   Review of Systems     Objective:   Physical Exam  Cardiovascular: Normal rate, regular rhythm and normal heart sounds.  Pulmonary/Chest: Effort normal and breath sounds normal. She has no wheezes.  Abdominal: Soft.  Redundant skin without panniculus, inelastic skin with minimal SQ fat, no hernias  Genitourinary: No breast discharge.  Lymphadenopathy:  She has no axillary adenopathy.   Grade 2 ptosis bilateral SN to nipple R 20 L 29 cm BW R 17 L 18 cm Nipple to IMF R 11 L 12 cm     Assessment:     Left breast ca Family hx breast cancer Active smoker- none since March 1    Plan:     Reviewed in absence of genetic abnormality no proven benefit of prophylactic mastectomy. Since last visit, genetic testing has come back negative. She desires bilateral mastectomy.  She reports she went to [facility] and was shown examples of implants and she desires anatomic and wants  C cup. Counseled that we will discuss this in future as implants will not be placed at time of mastectomy. This was upsetting to her as she thought she was going to be done with everything after this surgery. We went back and discussed again that first stage of this will be placement expanders, not implants. Reviewed submuscular placement expander, process expansion, second stage surgery for implants. Reviewed implant specific complications inc rupture, contracture, infection requiring removal. Reviewed OR length, hospital stay, drains. Reviewed role ADM in reconstruction, cadaveric source. Given degree ptosis and inelastic skin, large breast size, plan SSM. Reviewed additional risks including but not limited to mastectomy flap necrosis, seroma, hematoma, asymmetry, damage to deeper structures, DVT/PE, cardiopulmonary complications.   Again brought out examples of expander and reviewed, discussed loss of sensation post mastectomy, transverse scar on chest. She asked if scar could be put in fold. Counseled that we do utilize this approach at times, but as plan is to resect NAC, there will have to be a scar over breast mound/mid chest.   Irene Limbo, MD [facility] Plastic & Reconstructive Surgery [phone]

## 2015-05-20 NOTE — H&P (Deleted)
  Subjective:    Patient ID: Anne [name] is a 70 y.o. female.  HPI  Here for follow up discussion breast reconstruction. Recommended to patient by. AWynetta Emery PA-C. Presented following screening MMG with calcifications left UOQ. Biopsy with ADH and second biopsy in area benign. MRI with extremely dense tissue and 13 mm mass at 01-1129 position with extensive background enhancement. Second look [name] with 9 mm mass, biopsy with IDC ER/PR +, Her 2 -. Oncology at [facility] with Dr. Hinton Rao - started on tamoxifen and tolerating. No notes available but she states she was told no chemotherapy or radiation would be necessary. Per patient, did not discuss oncotype.   Genetics negative-patient still desires bilateral mastectomy.  Current 40 D Wt down 30 lbs since death of husband 2 years ago. Reports her grandchild is receiving radiation at [place] her adult granddaughter that would help her post surgery.  Stopped smoking March 1. Currently on antibiotics for URI/sinus infection and has 4 days left.   Review of Systems     Objective:   Physical Exam  Cardiovascular: Normal rate, regular rhythm and normal heart sounds.  Pulmonary/Chest: Effort normal and breath sounds normal. She has no wheezes.  Abdominal: Soft.  Redundant skin without panniculus, inelastic skin with minimal SQ fat, no hernias  Genitourinary: No breast discharge.  Lymphadenopathy:  She has no axillary adenopathy.   Grade 2 ptosis bilateral SN to nipple R 20 L 29 cm BW R 17 L 18 cm Nipple to IMF R 11 L 12 cm     Assessment:     Left breast ca Family hx breast cancer Active smoker- none since March 1    Plan:     Reviewed in absence of genetic abnormality no proven benefit of prophylactic mastectomy. Since last visit, genetic testing has come back negative. She desires bilateral mastectomy.  She reports she went to [facility] and was shown examples of implants and she desires anatomic and wants  C cup. Counseled that we will discuss this in future as implants will not be placed at time of mastectomy. This was upsetting to her as she thought she was going to be done with everything after this surgery. We went back and discussed again that first stage of this will be placement expanders, not implants. Reviewed submuscular placement expander, process expansion, second stage surgery for implants. Reviewed implant specific complications inc rupture, contracture, infection requiring removal. Reviewed OR length, hospital stay, drains. Reviewed role ADM in reconstruction, cadaveric source. Given degree ptosis and inelastic skin, large breast size, plan SSM. Reviewed additional risks including but not limited to mastectomy flap necrosis, seroma, hematoma, asymmetry, damage to deeper structures, DVT/PE, cardiopulmonary complications.   Again brought out examples of expander and reviewed, discussed loss of sensation post mastectomy, transverse scar on chest. She asked if scar could be put in fold. Counseled that we do utilize this approach at times, but as plan is to resect NAC, there will have to be a scar over breast mound/mid chest.   Irene Limbo, MD [facility] Plastic & Reconstructive Surgery [phone]

## 2015-05-23 DIAGNOSIS — J018 Other acute sinusitis: Secondary | ICD-10-CM | POA: Diagnosis not present

## 2015-05-26 ENCOUNTER — Encounter (HOSPITAL_BASED_OUTPATIENT_CLINIC_OR_DEPARTMENT_OTHER): Payer: Self-pay | Admitting: *Deleted

## 2015-05-26 DIAGNOSIS — R058 Other specified cough: Secondary | ICD-10-CM

## 2015-05-26 DIAGNOSIS — R0981 Nasal congestion: Secondary | ICD-10-CM

## 2015-05-26 HISTORY — DX: Nasal congestion: R09.81

## 2015-05-26 HISTORY — DX: Other specified cough: R05.8

## 2015-05-26 NOTE — Pre-Procedure Instructions (Signed)
To come for CMET and CBC, diff 

## 2015-05-31 ENCOUNTER — Encounter (HOSPITAL_BASED_OUTPATIENT_CLINIC_OR_DEPARTMENT_OTHER)
Admission: RE | Admit: 2015-05-31 | Discharge: 2015-05-31 | Disposition: A | Discharge status: Home / Self Care | Payer: Medicare Other | Source: Ambulatory Visit | Attending: General Surgery | Admitting: General Surgery

## 2015-05-31 DIAGNOSIS — Z7951 Long term (current) use of inhaled steroids: Secondary | ICD-10-CM | POA: Diagnosis not present

## 2015-05-31 DIAGNOSIS — J449 Chronic obstructive pulmonary disease, unspecified: Secondary | ICD-10-CM | POA: Diagnosis not present

## 2015-05-31 DIAGNOSIS — Z7982 Long term (current) use of aspirin: Secondary | ICD-10-CM | POA: Diagnosis not present

## 2015-05-31 DIAGNOSIS — Z170 Estrogen receptor positive status [ER+]: Secondary | ICD-10-CM | POA: Diagnosis not present

## 2015-05-31 DIAGNOSIS — C50212 Malignant neoplasm of upper-inner quadrant of left female breast: Secondary | ICD-10-CM | POA: Diagnosis not present

## 2015-05-31 DIAGNOSIS — F419 Anxiety disorder, unspecified: Secondary | ICD-10-CM | POA: Diagnosis not present

## 2015-05-31 DIAGNOSIS — C50912 Malignant neoplasm of unspecified site of left female breast: Secondary | ICD-10-CM | POA: Diagnosis present

## 2015-05-31 DIAGNOSIS — Z79810 Long term (current) use of selective estrogen receptor modulators (SERMs): Secondary | ICD-10-CM | POA: Diagnosis not present

## 2015-05-31 DIAGNOSIS — Z87891 Personal history of nicotine dependence: Secondary | ICD-10-CM | POA: Diagnosis not present

## 2015-05-31 DIAGNOSIS — L821 Other seborrheic keratosis: Secondary | ICD-10-CM | POA: Diagnosis not present

## 2015-05-31 DIAGNOSIS — G43909 Migraine, unspecified, not intractable, without status migrainosus: Secondary | ICD-10-CM | POA: Diagnosis not present

## 2015-05-31 DIAGNOSIS — Z803 Family history of malignant neoplasm of breast: Secondary | ICD-10-CM | POA: Diagnosis not present

## 2015-05-31 DIAGNOSIS — Z79899 Other long term (current) drug therapy: Secondary | ICD-10-CM | POA: Diagnosis not present

## 2015-05-31 DIAGNOSIS — F329 Major depressive disorder, single episode, unspecified: Secondary | ICD-10-CM | POA: Diagnosis not present

## 2015-05-31 DIAGNOSIS — C50412 Malignant neoplasm of upper-outer quadrant of left female breast: Secondary | ICD-10-CM | POA: Diagnosis not present

## 2015-05-31 LAB — CBC WITH DIFFERENTIAL/PLATELET
Basophils Absolute: 0 10*3/uL (ref 0.0–0.1)
Basophils Relative: 1 %
EOS PCT: 2 %
Eosinophils Absolute: 0.1 10*3/uL (ref 0.0–0.7)
HEMATOCRIT: 36.8 % (ref 36.0–46.0)
HEMOGLOBIN: 11.9 g/dL — AB (ref 12.0–15.0)
LYMPHS ABS: 1.6 10*3/uL (ref 0.7–4.0)
LYMPHS PCT: 37 %
MCH: 29.1 pg (ref 26.0–34.0)
MCHC: 32.3 g/dL (ref 30.0–36.0)
MCV: 90 fL (ref 78.0–100.0)
Monocytes Absolute: 0.7 10*3/uL (ref 0.1–1.0)
Monocytes Relative: 16 %
NEUTROS ABS: 1.9 10*3/uL (ref 1.7–7.7)
Neutrophils Relative %: 44 %
PLATELETS: 241 10*3/uL (ref 150–400)
RBC: 4.09 MIL/uL (ref 3.87–5.11)
RDW: 13.3 % (ref 11.5–15.5)
WBC: 4.3 10*3/uL (ref 4.0–10.5)

## 2015-05-31 LAB — COMPREHENSIVE METABOLIC PANEL
ALK PHOS: 56 U/L (ref 38–126)
ALT: 21 U/L (ref 14–54)
AST: 23 U/L (ref 15–41)
Albumin: 3.2 g/dL — ABNORMAL LOW (ref 3.5–5.0)
Anion gap: 7 (ref 5–15)
BILIRUBIN TOTAL: 0.4 mg/dL (ref 0.3–1.2)
BUN: 16 mg/dL (ref 6–20)
CALCIUM: 9 mg/dL (ref 8.9–10.3)
CO2: 21 mmol/L — ABNORMAL LOW (ref 22–32)
CREATININE: 0.88 mg/dL (ref 0.44–1.00)
Chloride: 116 mmol/L — ABNORMAL HIGH (ref 101–111)
Glucose, Bld: 103 mg/dL — ABNORMAL HIGH (ref 65–99)
Potassium: 4.3 mmol/L (ref 3.5–5.1)
Sodium: 144 mmol/L (ref 135–145)
Total Protein: 6.2 g/dL — ABNORMAL LOW (ref 6.5–8.1)

## 2015-06-01 NOTE — Pre-Procedure Instructions (Signed)
No orders from Dr Iran Planas. Spoke to office at 0825am and she will notify Dr Iran Planas ASAP

## 2015-06-01 NOTE — Anesthesia Preprocedure Evaluation (Addendum)
Anesthesia Evaluation  Patient identified by MRN, date of birth, ID band Patient awake    Reviewed: Allergy & Precautions, NPO status , Patient's Chart, lab work & pertinent test results  Airway Mallampati: II  TM Distance: >3 FB Neck ROM: Full    Dental   Pulmonary COPD, former smoker,    breath sounds clear to auscultation       Cardiovascular negative cardio ROS   Rhythm:Regular Rate:Normal     Neuro/Psych negative neurological ROS     GI/Hepatic negative GI ROS, Neg liver ROS,   Endo/Other  negative endocrine ROS  Renal/GU negative Renal ROS     Musculoskeletal   Abdominal   Peds  Hematology negative hematology ROS (+)   Anesthesia Other Findings   Reproductive/Obstetrics                            Lab Results  Component Value Date   WBC 4.3 05/31/2015   HGB 11.9* 05/31/2015   HCT 36.8 05/31/2015   MCV 90.0 05/31/2015   PLT 241 05/31/2015   Lab Results  Component Value Date   CREATININE 0.88 05/31/2015   BUN 16 05/31/2015   NA 144 05/31/2015   K 4.3 05/31/2015   CL 116* 05/31/2015   CO2 21* 05/31/2015    Anesthesia Physical Anesthesia Plan  ASA: II  Anesthesia Plan: General and Regional   Post-op Pain Management: GA combined w/ Regional for post-op pain   Induction: Intravenous  Airway Management Planned: Oral ETT  Additional Equipment:   Intra-op Plan:   Post-operative Plan: Extubation in OR  Informed Consent: I have reviewed the patients History and Physical, chart, labs and discussed the procedure including the risks, benefits and alternatives for the proposed anesthesia with the patient or authorized representative who has indicated his/her understanding and acceptance.   Dental advisory given  Plan Discussed with: CRNA  Anesthesia Plan Comments:        Anesthesia Quick Evaluation

## 2015-06-02 ENCOUNTER — Encounter (HOSPITAL_BASED_OUTPATIENT_CLINIC_OR_DEPARTMENT_OTHER)
Admission: RE | Disposition: A | Discharge status: Home / Self Care | Payer: Self-pay | Source: Ambulatory Visit | Attending: General Surgery

## 2015-06-02 ENCOUNTER — Ambulatory Visit (HOSPITAL_BASED_OUTPATIENT_CLINIC_OR_DEPARTMENT_OTHER): Payer: Medicare Other | Admitting: Anesthesiology

## 2015-06-02 ENCOUNTER — Encounter (HOSPITAL_COMMUNITY)
Admission: RE | Admit: 2015-06-02 | Discharge: 2015-06-02 | Disposition: A | Discharge status: Home / Self Care | Payer: Medicare Other | Source: Ambulatory Visit | Attending: General Surgery | Admitting: General Surgery

## 2015-06-02 ENCOUNTER — Encounter (HOSPITAL_BASED_OUTPATIENT_CLINIC_OR_DEPARTMENT_OTHER): Payer: Self-pay

## 2015-06-02 ENCOUNTER — Ambulatory Visit (HOSPITAL_BASED_OUTPATIENT_CLINIC_OR_DEPARTMENT_OTHER)
Admission: RE | Admit: 2015-06-02 | Discharge: 2015-06-03 | Disposition: A | Discharge status: Home / Self Care | Payer: Medicare Other | Source: Ambulatory Visit | Attending: General Surgery | Admitting: General Surgery

## 2015-06-02 DIAGNOSIS — Z87891 Personal history of nicotine dependence: Secondary | ICD-10-CM | POA: Insufficient documentation

## 2015-06-02 DIAGNOSIS — Z79810 Long term (current) use of selective estrogen receptor modulators (SERMs): Secondary | ICD-10-CM | POA: Insufficient documentation

## 2015-06-02 DIAGNOSIS — C50212 Malignant neoplasm of upper-inner quadrant of left female breast: Secondary | ICD-10-CM | POA: Insufficient documentation

## 2015-06-02 DIAGNOSIS — G43909 Migraine, unspecified, not intractable, without status migrainosus: Secondary | ICD-10-CM | POA: Insufficient documentation

## 2015-06-02 DIAGNOSIS — N6011 Diffuse cystic mastopathy of right breast: Secondary | ICD-10-CM | POA: Diagnosis not present

## 2015-06-02 DIAGNOSIS — J449 Chronic obstructive pulmonary disease, unspecified: Secondary | ICD-10-CM | POA: Insufficient documentation

## 2015-06-02 DIAGNOSIS — Z1379 Encounter for other screening for genetic and chromosomal anomalies: Secondary | ICD-10-CM

## 2015-06-02 DIAGNOSIS — C50412 Malignant neoplasm of upper-outer quadrant of left female breast: Secondary | ICD-10-CM | POA: Insufficient documentation

## 2015-06-02 DIAGNOSIS — Z170 Estrogen receptor positive status [ER+]: Secondary | ICD-10-CM | POA: Diagnosis not present

## 2015-06-02 DIAGNOSIS — Z79899 Other long term (current) drug therapy: Secondary | ICD-10-CM | POA: Insufficient documentation

## 2015-06-02 DIAGNOSIS — L821 Other seborrheic keratosis: Secondary | ICD-10-CM | POA: Diagnosis not present

## 2015-06-02 DIAGNOSIS — N6081 Other benign mammary dysplasias of right breast: Secondary | ICD-10-CM | POA: Diagnosis not present

## 2015-06-02 DIAGNOSIS — F329 Major depressive disorder, single episode, unspecified: Secondary | ICD-10-CM | POA: Insufficient documentation

## 2015-06-02 DIAGNOSIS — R921 Mammographic calcification found on diagnostic imaging of breast: Secondary | ICD-10-CM | POA: Diagnosis not present

## 2015-06-02 DIAGNOSIS — C50912 Malignant neoplasm of unspecified site of left female breast: Secondary | ICD-10-CM | POA: Diagnosis present

## 2015-06-02 DIAGNOSIS — Z803 Family history of malignant neoplasm of breast: Secondary | ICD-10-CM | POA: Insufficient documentation

## 2015-06-02 DIAGNOSIS — C50612 Malignant neoplasm of axillary tail of left female breast: Secondary | ICD-10-CM | POA: Diagnosis not present

## 2015-06-02 DIAGNOSIS — Z23 Encounter for immunization: Secondary | ICD-10-CM | POA: Insufficient documentation

## 2015-06-02 DIAGNOSIS — F419 Anxiety disorder, unspecified: Secondary | ICD-10-CM | POA: Insufficient documentation

## 2015-06-02 DIAGNOSIS — R079 Chest pain, unspecified: Secondary | ICD-10-CM | POA: Diagnosis not present

## 2015-06-02 DIAGNOSIS — Z7951 Long term (current) use of inhaled steroids: Secondary | ICD-10-CM | POA: Insufficient documentation

## 2015-06-02 DIAGNOSIS — G8918 Other acute postprocedural pain: Secondary | ICD-10-CM | POA: Diagnosis not present

## 2015-06-02 DIAGNOSIS — Z7982 Long term (current) use of aspirin: Secondary | ICD-10-CM | POA: Insufficient documentation

## 2015-06-02 DIAGNOSIS — Z853 Personal history of malignant neoplasm of breast: Secondary | ICD-10-CM | POA: Diagnosis not present

## 2015-06-02 HISTORY — PX: MASTECTOMY W/ SENTINEL NODE BIOPSY: SHX2001

## 2015-06-02 HISTORY — DX: Personal history of transient ischemic attack (TIA), and cerebral infarction without residual deficits: Z86.73

## 2015-06-02 HISTORY — PX: BREAST RECONSTRUCTION WITH PLACEMENT OF TISSUE EXPANDER AND FLEX HD (ACELLULAR HYDRATED DERMIS): SHX6295

## 2015-06-02 HISTORY — DX: Other specified postprocedural states: R11.2

## 2015-06-02 HISTORY — DX: Presence of dental prosthetic device (complete) (partial): Z97.2

## 2015-06-02 HISTORY — DX: Other specified postprocedural states: Z98.890

## 2015-06-02 HISTORY — DX: Malignant neoplasm of unspecified site of unspecified female breast: C50.919

## 2015-06-02 HISTORY — DX: Cough: R05

## 2015-06-02 HISTORY — DX: Dental restoration status: Z98.811

## 2015-06-02 HISTORY — DX: Personal history of traumatic brain injury: Z87.820

## 2015-06-02 HISTORY — DX: Nasal congestion: R09.81

## 2015-06-02 HISTORY — DX: Encounter for other screening for genetic and chromosomal anomalies: Z13.79

## 2015-06-02 HISTORY — DX: Personal history of urinary calculi: Z87.442

## 2015-06-02 SURGERY — MASTECTOMY WITH SENTINEL LYMPH NODE BIOPSY
Anesthesia: Regional | Site: Breast | Laterality: Bilateral

## 2015-06-02 MED ORDER — DIPHENHYDRAMINE HCL 12.5 MG/5ML PO ELIX: 12.5000 mg | ORAL_SOLUTION | Freq: Four times a day (QID) | ORAL | Status: DC | PRN

## 2015-06-02 MED ORDER — ONDANSETRON 4 MG PO TBDP: 4.0000 mg | ORAL_TABLET | Freq: Four times a day (QID) | ORAL | Status: DC | PRN

## 2015-06-02 MED ORDER — BUPIVACAINE-EPINEPHRINE (PF) 0.25% -1:200000 IJ SOLN
INTRAMUSCULAR | Status: AC
  Filled 2015-06-02: qty 60

## 2015-06-02 MED ORDER — MIDAZOLAM HCL 2 MG/2ML IJ SOLN
INTRAMUSCULAR | Status: AC
Start: 2015-06-02 — End: 2015-06-02
  Filled 2015-06-02: qty 2

## 2015-06-02 MED ORDER — ONDANSETRON HCL 4 MG/2ML IJ SOLN
INTRAMUSCULAR | Status: DC | PRN
  Administered 2015-06-02: 4 mg via INTRAVENOUS

## 2015-06-02 MED ORDER — METHYLENE BLUE 0.5 % INJ SOLN
INTRAVENOUS | Status: AC
  Filled 2015-06-02: qty 10

## 2015-06-02 MED ORDER — SODIUM CHLORIDE 0.9 % IR SOLN
Status: DC | PRN
  Administered 2015-06-02: 1000 mL

## 2015-06-02 MED ORDER — MIDAZOLAM HCL 2 MG/2ML IJ SOLN
INTRAMUSCULAR | Status: AC
  Filled 2015-06-02: qty 2

## 2015-06-02 MED ORDER — METHOCARBAMOL 500 MG PO TABS
500.0000 mg | ORAL_TABLET | Freq: Four times a day (QID) | ORAL | Status: DC | PRN
  Administered 2015-06-02 – 2015-06-03 (×2): 500 mg via ORAL
  Filled 2015-06-02 (×2): qty 1

## 2015-06-02 MED ORDER — SUCCINYLCHOLINE CHLORIDE 20 MG/ML IJ SOLN
INTRAMUSCULAR | Status: DC | PRN
  Administered 2015-06-02: 50 mg via INTRAVENOUS

## 2015-06-02 MED ORDER — SUMATRIPTAN SUCCINATE 100 MG PO TABS: 100.0000 mg | ORAL_TABLET | ORAL | Status: DC | PRN

## 2015-06-02 MED ORDER — DEXAMETHASONE SODIUM PHOSPHATE 4 MG/ML IJ SOLN
INTRAMUSCULAR | Status: DC | PRN
  Administered 2015-06-02: 10 mg via INTRAVENOUS

## 2015-06-02 MED ORDER — DOCUSATE SODIUM 100 MG PO CAPS
100.0000 mg | ORAL_CAPSULE | Freq: Two times a day (BID) | ORAL | Status: DC
  Administered 2015-06-02: 100 mg via ORAL
  Filled 2015-06-02: qty 1

## 2015-06-02 MED ORDER — FENTANYL CITRATE (PF) 100 MCG/2ML IJ SOLN
25.0000 ug | INTRAMUSCULAR | Status: DC | PRN
  Administered 2015-06-02 (×3): 25 ug via INTRAVENOUS

## 2015-06-02 MED ORDER — ACETAMINOPHEN 325 MG PO TABS
650.0000 mg | ORAL_TABLET | Freq: Four times a day (QID) | ORAL | Status: DC | PRN
Start: 2015-06-02 — End: 2015-06-03

## 2015-06-02 MED ORDER — FENTANYL CITRATE (PF) 100 MCG/2ML IJ SOLN
INTRAMUSCULAR | Status: AC
  Filled 2015-06-02: qty 2

## 2015-06-02 MED ORDER — MORPHINE SULFATE (PF) 2 MG/ML IV SOLN
1.0000 mg | INTRAVENOUS | Status: DC | PRN
  Administered 2015-06-02 (×2): 2 mg via INTRAVENOUS
  Filled 2015-06-02 (×2): qty 1

## 2015-06-02 MED ORDER — FENTANYL CITRATE (PF) 100 MCG/2ML IJ SOLN
50.0000 ug | INTRAMUSCULAR | Status: DC | PRN
  Administered 2015-06-02 (×2): 50 ug via INTRAVENOUS

## 2015-06-02 MED ORDER — MIDAZOLAM HCL 2 MG/2ML IJ SOLN
1.0000 mg | INTRAMUSCULAR | Status: DC | PRN
  Administered 2015-06-02: 2 mg via INTRAVENOUS

## 2015-06-02 MED ORDER — CIPROFLOXACIN IN D5W 400 MG/200ML IV SOLN
INTRAVENOUS | Status: AC
  Filled 2015-06-02: qty 200

## 2015-06-02 MED ORDER — PROMETHAZINE HCL 25 MG PO TABS: 25.0000 mg | ORAL_TABLET | Freq: Four times a day (QID) | ORAL | Status: DC | PRN

## 2015-06-02 MED ORDER — LIDOCAINE HCL (CARDIAC) 20 MG/ML IV SOLN
INTRAVENOUS | Status: DC | PRN
  Administered 2015-06-02: 50 mg via INTRAVENOUS

## 2015-06-02 MED ORDER — TECHNETIUM TC 99M SULFUR COLLOID FILTERED
1.0000 | Freq: Once | INTRAVENOUS | Status: AC | PRN
  Administered 2015-06-02: 1 via INTRADERMAL

## 2015-06-02 MED ORDER — SIMETHICONE 80 MG PO CHEW: 40.0000 mg | CHEWABLE_TABLET | Freq: Four times a day (QID) | ORAL | Status: DC | PRN

## 2015-06-02 MED ORDER — CIPROFLOXACIN IN D5W 400 MG/200ML IV SOLN
400.0000 mg | INTRAVENOUS | Status: AC
  Administered 2015-06-02: 400 mg via INTRAVENOUS

## 2015-06-02 MED ORDER — HYDROCODONE-ACETAMINOPHEN 5-325 MG PO TABS: 1.0000 | ORAL_TABLET | ORAL | Status: DC | PRN

## 2015-06-02 MED ORDER — ONDANSETRON HCL 4 MG/2ML IJ SOLN
INTRAMUSCULAR | Status: AC
Start: 2015-06-02 — End: 2015-06-02
  Filled 2015-06-02: qty 2

## 2015-06-02 MED ORDER — TIOTROPIUM BROMIDE MONOHYDRATE 18 MCG IN CAPS: 18.0000 ug | ORAL_CAPSULE | Freq: Every day | RESPIRATORY_TRACT | Status: DC

## 2015-06-02 MED ORDER — CIPROFLOXACIN IN D5W 400 MG/200ML IV SOLN
400.0000 mg | Freq: Two times a day (BID) | INTRAVENOUS | Status: DC
  Administered 2015-06-02: 400 mg via INTRAVENOUS
  Filled 2015-06-02: qty 200

## 2015-06-02 MED ORDER — KCL IN DEXTROSE-NACL 20-5-0.45 MEQ/L-%-% IV SOLN
INTRAVENOUS | Status: AC
  Administered 2015-06-02: 15:00:00 via INTRAVENOUS
  Filled 2015-06-02: qty 1000

## 2015-06-02 MED ORDER — ALBUTEROL SULFATE HFA 108 (90 BASE) MCG/ACT IN AERS: 1.0000 | INHALATION_SPRAY | RESPIRATORY_TRACT | Status: DC | PRN

## 2015-06-02 MED ORDER — MIRTAZAPINE 30 MG PO TABS
30.0000 mg | ORAL_TABLET | Freq: Every day | ORAL | Status: DC
  Administered 2015-06-02: 30 mg via ORAL

## 2015-06-02 MED ORDER — GLYCOPYRROLATE 0.2 MG/ML IJ SOLN: 0.2000 mg | Freq: Once | INTRAMUSCULAR | Status: DC | PRN

## 2015-06-02 MED ORDER — SODIUM CHLORIDE 0.9 % IV SOLN
INTRAVENOUS | Status: DC | PRN
  Administered 2015-06-02: 400 mL via INTRAMUSCULAR

## 2015-06-02 MED ORDER — MIDAZOLAM HCL 5 MG/5ML IJ SOLN
INTRAMUSCULAR | Status: DC | PRN
  Administered 2015-06-02: 2 mg via INTRAVENOUS

## 2015-06-02 MED ORDER — PAROXETINE HCL 20 MG PO TABS
40.0000 mg | ORAL_TABLET | Freq: Every day | ORAL | Status: DC
  Administered 2015-06-02: 40 mg via ORAL

## 2015-06-02 MED ORDER — ONDANSETRON HCL 4 MG/2ML IJ SOLN: 4.0000 mg | Freq: Four times a day (QID) | INTRAMUSCULAR | Status: DC | PRN

## 2015-06-02 MED ORDER — PROPOFOL 10 MG/ML IV BOLUS
INTRAVENOUS | Status: AC
  Filled 2015-06-02: qty 40

## 2015-06-02 MED ORDER — SCOPOLAMINE 1 MG/3DAYS TD PT72: 1.0000 | MEDICATED_PATCH | Freq: Once | TRANSDERMAL | Status: DC | PRN

## 2015-06-02 MED ORDER — ACETAMINOPHEN 650 MG RE SUPP: 650.0000 mg | Freq: Four times a day (QID) | RECTAL | Status: DC | PRN

## 2015-06-02 MED ORDER — FENTANYL CITRATE (PF) 100 MCG/2ML IJ SOLN
INTRAMUSCULAR | Status: DC | PRN
  Administered 2015-06-02: 100 ug via INTRAVENOUS

## 2015-06-02 MED ORDER — HYDROXYZINE HCL 50 MG PO TABS: 50.0000 mg | ORAL_TABLET | Freq: Four times a day (QID) | ORAL | Status: DC | PRN

## 2015-06-02 MED ORDER — PHENYLEPHRINE 40 MCG/ML (10ML) SYRINGE FOR IV PUSH (FOR BLOOD PRESSURE SUPPORT)
PREFILLED_SYRINGE | INTRAVENOUS | Status: AC
  Filled 2015-06-02: qty 20

## 2015-06-02 MED ORDER — SODIUM CHLORIDE 0.9 % IJ SOLN
INTRAMUSCULAR | Status: AC
  Filled 2015-06-02: qty 10

## 2015-06-02 MED ORDER — CIPROFLOXACIN HCL 500 MG PO TABS: 500.0000 mg | ORAL_TABLET | Freq: Two times a day (BID) | ORAL | Status: DC

## 2015-06-02 MED ORDER — TRAZODONE HCL 100 MG PO TABS: 100.0000 mg | ORAL_TABLET | Freq: Every evening | ORAL | Status: DC | PRN

## 2015-06-02 MED ORDER — BUPIVACAINE-EPINEPHRINE (PF) 0.25% -1:200000 IJ SOLN
INTRAMUSCULAR | Status: DC | PRN
  Administered 2015-06-02: 25 mL
  Administered 2015-06-02: 20 mL

## 2015-06-02 MED ORDER — PROPOFOL 10 MG/ML IV BOLUS
INTRAVENOUS | Status: DC | PRN
  Administered 2015-06-02: 100 mg via INTRAVENOUS
  Administered 2015-06-02: 200 mg via INTRAVENOUS
  Administered 2015-06-02: 20 mg via INTRAVENOUS
  Administered 2015-06-02: 50 mg via INTRAVENOUS

## 2015-06-02 MED ORDER — HYDROCODONE-ACETAMINOPHEN 7.5-325 MG PO TABS: 1.0000 | ORAL_TABLET | Freq: Once | ORAL | Status: DC | PRN

## 2015-06-02 MED ORDER — DIPHENHYDRAMINE HCL 50 MG/ML IJ SOLN: 12.5000 mg | Freq: Four times a day (QID) | INTRAMUSCULAR | Status: DC | PRN

## 2015-06-02 MED ORDER — FLUTICASONE FUROATE-VILANTEROL 100-25 MCG/INH IN AEPB: 1.0000 | INHALATION_SPRAY | Freq: Every day | RESPIRATORY_TRACT | Status: DC

## 2015-06-02 MED ORDER — PHENYLEPHRINE 40 MCG/ML (10ML) SYRINGE FOR IV PUSH (FOR BLOOD PRESSURE SUPPORT)
PREFILLED_SYRINGE | INTRAVENOUS | Status: AC
  Filled 2015-06-02: qty 10

## 2015-06-02 MED ORDER — LIDOCAINE HCL (CARDIAC) 20 MG/ML IV SOLN
INTRAVENOUS | Status: AC
  Filled 2015-06-02: qty 5

## 2015-06-02 MED ORDER — LACTATED RINGERS IV SOLN
INTRAVENOUS | Status: DC
  Administered 2015-06-02 (×4): via INTRAVENOUS

## 2015-06-02 MED ORDER — PRAMIPEXOLE DIHYDROCHLORIDE 1 MG PO TABS
1.0000 mg | ORAL_TABLET | Freq: Every day | ORAL | Status: DC
  Administered 2015-06-02: 1 mg via ORAL

## 2015-06-02 MED ORDER — TOPIRAMATE 100 MG PO TABS
100.0000 mg | ORAL_TABLET | Freq: Every day | ORAL | Status: DC
  Administered 2015-06-02: 100 mg via ORAL

## 2015-06-02 MED ORDER — EPHEDRINE SULFATE 50 MG/ML IJ SOLN
INTRAMUSCULAR | Status: DC | PRN
  Administered 2015-06-02 (×2): 10 mg via INTRAVENOUS
  Administered 2015-06-02: 5 mg via INTRAVENOUS

## 2015-06-02 MED ORDER — PROMETHAZINE HCL 25 MG/ML IJ SOLN
6.2500 mg | INTRAMUSCULAR | Status: DC | PRN
  Administered 2015-06-02: 6.25 mg via INTRAVENOUS
  Filled 2015-06-02: qty 1

## 2015-06-02 MED ORDER — DEXAMETHASONE SODIUM PHOSPHATE 10 MG/ML IJ SOLN
INTRAMUSCULAR | Status: AC
  Filled 2015-06-02: qty 1

## 2015-06-02 MED ORDER — PHENYLEPHRINE HCL 10 MG/ML IJ SOLN
INTRAMUSCULAR | Status: DC | PRN
  Administered 2015-06-02: 40 ug via INTRAVENOUS
  Administered 2015-06-02 (×3): 80 ug via INTRAVENOUS
  Administered 2015-06-02: 40 ug via INTRAVENOUS
  Administered 2015-06-02 (×2): 80 ug via INTRAVENOUS

## 2015-06-02 MED ORDER — HYDROCODONE-ACETAMINOPHEN 5-325 MG PO TABS
1.0000 | ORAL_TABLET | ORAL | Status: DC | PRN
  Administered 2015-06-02: 2 via ORAL
  Administered 2015-06-02 – 2015-06-03 (×2): 1 via ORAL
  Filled 2015-06-02: qty 1
  Filled 2015-06-02 (×2): qty 2

## 2015-06-02 SURGICAL SUPPLY — 85 items
BAG DECANTER FOR FLEXI CONT (MISCELLANEOUS) ×4 IMPLANT
BINDER BREAST LRG (GAUZE/BANDAGES/DRESSINGS) ×4 IMPLANT
BINDER BREAST MEDIUM (GAUZE/BANDAGES/DRESSINGS) IMPLANT
BLADE HEX COATED 2.75 (ELECTRODE) ×3 IMPLANT
BLADE SURG 10 STRL SS (BLADE) ×4 IMPLANT
BLADE SURG 15 STRL LF DISP TIS (BLADE) ×2 IMPLANT
BLADE SURG 15 STRL SS (BLADE) ×6
BNDG GAUZE ELAST 4 BULKY (GAUZE/BANDAGES/DRESSINGS) ×6 IMPLANT
CANISTER SUCT 1200ML W/VALVE (MISCELLANEOUS) ×4 IMPLANT
CHLORAPREP W/TINT 26ML (MISCELLANEOUS) ×6 IMPLANT
CLIP TI LARGE 6 (CLIP) IMPLANT
CLIP TI MEDIUM 6 (CLIP) ×6 IMPLANT
CLIP TI WIDE RED SMALL 6 (CLIP) IMPLANT
CLOSURE WOUND 1/2 X4 (GAUZE/BANDAGES/DRESSINGS)
COVER MAYO STAND STRL (DRAPES) ×6 IMPLANT
COVER PROBE W GEL 5X96 (DRAPES) ×3 IMPLANT
DRAIN CHANNEL 19F RND (DRAIN) ×3 IMPLANT
DRAPE UTILITY XL STRL (DRAPES) ×6 IMPLANT
DRSG PAD ABDOMINAL 8X10 ST (GAUZE/BANDAGES/DRESSINGS) ×8 IMPLANT
DRSG TEGADERM 4X10 (GAUZE/BANDAGES/DRESSINGS) IMPLANT
DRSG TEGADERM 4X4.75 (GAUZE/BANDAGES/DRESSINGS) IMPLANT
ELECT BLADE 4.0 EZ CLEAN MEGAD (MISCELLANEOUS) ×6
ELECT COATED BLADE 2.86 ST (ELECTRODE) ×1 IMPLANT
ELECT REM PT RETURN 9FT ADLT (ELECTROSURGICAL) ×3
ELECTRODE BLDE 4.0 EZ CLN MEGD (MISCELLANEOUS) ×1 IMPLANT
ELECTRODE REM PT RTRN 9FT ADLT (ELECTROSURGICAL) ×2 IMPLANT
EVACUATOR SILICONE 100CC (DRAIN) ×4 IMPLANT
EXPANDER TISSUE MX 400CC (Breast) IMPLANT
GAUZE SPONGE 4X4 12PLY STRL (GAUZE/BANDAGES/DRESSINGS) ×3 IMPLANT
GLOVE BIO SURGEON STRL SZ 6 (GLOVE) ×15 IMPLANT
GLOVE BIO SURGEON STRL SZ 6.5 (GLOVE) ×1 IMPLANT
GLOVE BIO SURGEON STRL SZ7 (GLOVE) ×2 IMPLANT
GLOVE BIO SURGEONS STRL SZ 6.5 (GLOVE)
GLOVE BIOGEL PI IND STRL 6.5 (GLOVE) ×1 IMPLANT
GLOVE BIOGEL PI IND STRL 7.0 (GLOVE) IMPLANT
GLOVE BIOGEL PI INDICATOR 6.5 (GLOVE) ×2
GLOVE BIOGEL PI INDICATOR 7.0 (GLOVE) ×6
GLOVE ECLIPSE 6.5 STRL STRAW (GLOVE) ×4 IMPLANT
GOWN STRL REUS W/ TWL LRG LVL3 (GOWN DISPOSABLE) ×3 IMPLANT
GOWN STRL REUS W/ TWL XL LVL3 (GOWN DISPOSABLE) IMPLANT
GOWN STRL REUS W/TWL 2XL LVL3 (GOWN DISPOSABLE) ×3 IMPLANT
GOWN STRL REUS W/TWL LRG LVL3 (GOWN DISPOSABLE) ×12
GOWN STRL REUS W/TWL XL LVL3 (GOWN DISPOSABLE) ×3
GRAFT FLEX HD BILAT 4X16 THICK (Tissue Mesh) ×6 IMPLANT
IV NS 500ML (IV SOLUTION)
IV NS 500ML BAXH (IV SOLUTION) ×1 IMPLANT
KIT FILL SYSTEM UNIVERSAL (SET/KITS/TRAYS/PACK) ×1 IMPLANT
LIGHT WAVEGUIDE WIDE FLAT (MISCELLANEOUS) ×2 IMPLANT
LIQUID BAND (GAUZE/BANDAGES/DRESSINGS) ×6 IMPLANT
NDL HYPO 25X1 1.5 SAFETY (NEEDLE) ×2 IMPLANT
NDL SPNL 18GX3.5 QUINCKE PK (NEEDLE) ×1 IMPLANT
NDL SPNL 22GX3.5 QUINCKE BK (NEEDLE) IMPLANT
NEEDLE HYPO 25X1 1.5 SAFETY (NEEDLE) ×3 IMPLANT
NEEDLE SPNL 18GX3.5 QUINCKE PK (NEEDLE) IMPLANT
NEEDLE SPNL 22GX3.5 QUINCKE BK (NEEDLE) IMPLANT
NS IRRIG 1000ML POUR BTL (IV SOLUTION) ×3 IMPLANT
PACK BASIN DAY SURGERY FS (CUSTOM PROCEDURE TRAY) ×4 IMPLANT
PACK UNIVERSAL I (CUSTOM PROCEDURE TRAY) ×3 IMPLANT
PENCIL BUTTON HOLSTER BLD 10FT (ELECTRODE) ×6 IMPLANT
PIN SAFETY STERILE (MISCELLANEOUS) ×4 IMPLANT
SLEEVE SCD COMPRESS KNEE MED (MISCELLANEOUS) ×4 IMPLANT
SPONGE LAP 18X18 X RAY DECT (DISPOSABLE) ×9 IMPLANT
STAPLER VISISTAT 35W (STAPLE) ×2 IMPLANT
STOCKINETTE IMPERVIOUS LG (DRAPES) ×3 IMPLANT
STRIP CLOSURE SKIN 1/2X4 (GAUZE/BANDAGES/DRESSINGS) ×1 IMPLANT
SUT ETHILON 2 0 FS 18 (SUTURE) ×4 IMPLANT
SUT MNCRL AB 4-0 PS2 18 (SUTURE) ×9 IMPLANT
SUT SILK 0 TIES 10X30 (SUTURE) ×1 IMPLANT
SUT SILK 2 0 SH (SUTURE) ×2 IMPLANT
SUT VIC AB 3-0 SH 27 (SUTURE) ×18
SUT VIC AB 3-0 SH 27X BRD (SUTURE) ×1 IMPLANT
SUT VICRYL 3-0 CR8 SH (SUTURE) ×2 IMPLANT
SUT VICRYL 4-0 PS2 18IN ABS (SUTURE) ×5 IMPLANT
SUT VICRYL AB 2 0 TIE (SUTURE) ×1 IMPLANT
SUT VICRYL AB 2 0 TIES (SUTURE)
SYR 50ML LL SCALE MARK (SYRINGE) ×1 IMPLANT
SYR BULB IRRIGATION 50ML (SYRINGE) ×5 IMPLANT
SYR CONTROL 10ML LL (SYRINGE) ×3 IMPLANT
TISSUE EXPANDER MX 400CC (Breast) ×6 IMPLANT
TOWEL OR 17X24 6PK STRL BLUE (TOWEL DISPOSABLE) ×7 IMPLANT
TOWEL OR NON WOVEN STRL DISP B (DISPOSABLE) ×3 IMPLANT
TUBE CONNECTING 20'X1/4 (TUBING) ×2
TUBE CONNECTING 20X1/4 (TUBING) ×4 IMPLANT
UNDERPAD 30X30 (UNDERPADS AND DIAPERS) ×7 IMPLANT
YANKAUER SUCT BULB TIP NO VENT (SUCTIONS) ×4 IMPLANT

## 2015-06-02 NOTE — H&P (Signed)
Anne [name] Location: [facility] Patient #: [id number] DOB: November 23, 1945 Widowed / Language: Cleophus Molt / Race: White Female   History of Present Illness Patient words: eval breast.  The patient is a 70 year old female who presents with breast cancer. Patient is a 70 year old female referred by Dr. Dirk Dress of [place] with a new diagnosis of a left breast cancer. The patient presented with screening detected calcifications in the upper outer quadrant of the left breast. She subsequently underwent diagnostic imaging which showed an indeterminate group of linear calcifications within a larger group of innumerable calcifications. Biopsy was performed and one of the areas of calcification which demonstrated atypical ductal hyperplasia. A second biopsy in the nearby area without calcifications was negative for atypical ductal hyperplasia or ductal carcinoma in situ. Based on the significant abnormality seen, an MRI was performed. The patient does have heterogeneously dense breast tissue on the mammogram, but extremely dense on the MRI. She was seen to have minimal background enhancement on the right breast and market background enhancement on the left. There was also noted a small mass that was 13 mm in greatest dimension at the 11:00 to 11:30 position. A second look ultrasound was performed and a biopsy was performed. This was 9 mm in greatest dimension on ultrasound. That pathology was positive for invasive and is safe to ductal carcinoma that is ER and PR positive and HER-2 negative. Ki-67 is 10%.  The patient has had a previous left-sided excisional breast biopsy that was benign. The patient does have a significant family history for cancer. Her mother had cervical cancer. Maternal aunt had breast cancer in her 17s. Maternal grandmother had breast cancer in her 70s but did recur. She has a niece that had breast cancer recently diagnosed in her 4s. The patient had  menarche at age 47. She is a G3 P1 with her first child born in their early 32s. She did use oral contraceptives for a period of time. She underwent hysterectomy and had surgical menopause at less than 56 years of age.   diagnostic mammogram 02/22/15- breast density category C indeterminate calcifications with a linear orientation in the upper outer left breast, middle depth, wihin innumerable calcifications throughout the upper outer quadrant from chest wall to nipple. BIRADS 4  Breast MRI - [place] 03/16/15 13 mm mass in posterior 11 o'clock positiion of the left breast. Unlikely that post biopsy change.   pathology 11'o' clock lesion ER/PR postiive. Her-2 negative. Invasive and in situ ductal carcinoma. at least 7 mm. + calcifications.   Pathology UOQ calcs middle depth calcifications with atypical ductal hyperplasia, columnar cell change and apocrine metaplasia.   left upper breast without calcs - atrophic breast tissue with fibrocystic changes.    Other Problems Anxiety Disorder Asthma Back Pain Chronic Obstructive Lung Disease Depression General anesthesia - complications Migraine Headache Oophorectomy  Past Surgical History Appendectomy Breast Biopsy Left. Foot Surgery Left. Hysterectomy (not due to cancer) - Complete Knee Surgery Left. Tonsillectomy  Diagnostic Studies History  Colonoscopy within last year Mammogram within last year Pap Smear 1-5 years ago  Allergies Rocephin *CEPHALOSPORINS* Lyrica *ANTICONVULSANTS*  Medication History Hydrocodone-Acetaminophen (5-325MG Tablet, Oral) Active. Eszopiclone (3MG Tablet, Oral) Active. Atorvastatin Calcium (10MG Tablet, Oral) Active. Clopidogrel Bisulfate (75MG Tablet, Oral) Active. Fenofibrate (160MG Tablet, Oral) Active. HydrOXYzine HCl (50MG Tablet, Oral) Active. Mirtazapine (30MG Tablet, Oral) Active. Mupirocin (2% Ointment, External) Active. PARoxetine HCl (40MG  Tablet, Oral) Active. SUMAtriptan Succinate (100MG Tablet, Oral) Active. Topiramate (50MG Tablet, Oral) Active.  Vitamin D (Ergocalciferol) (50000UNIT Capsule, Oral) Active. Pramipexole Dihydrochloride (0.5MG Tablet, Oral) Active.  Social History  Alcohol use Occasional alcohol use. Caffeine use Carbonated beverages, Tea. No drug use Tobacco use Current every day smoker.  Family History Cervical Cancer Mother. Respiratory Condition Sister.  Pregnancy / Birth History Age at menarche 39 years. Age of menopause <46 Contraceptive History Oral contraceptives. Gravida 3 Maternal age 33-25 Para 1    Review of Systems General Present- Weight Loss. Not Present- Appetite Loss, Chills, Fatigue, Fever, Night Sweats and Weight Gain. Skin Not Present- Change in Wart/Mole, Dryness, Hives, Jaundice, New Lesions, Non-Healing Wounds, Rash and Ulcer. HEENT Present- Seasonal Allergies and Wears glasses/contact lenses. Not Present- Earache, Hearing Loss, Hoarseness, Nose Bleed, Oral Ulcers, Ringing in the Ears, Sinus Pain, Sore Throat, Visual Disturbances and Yellow Eyes. Breast Present- Breast Mass. Not Present- Breast Pain, Nipple Discharge and Skin Changes. Cardiovascular Not Present- Chest Pain, Difficulty Breathing Lying Down, Leg Cramps, Palpitations, Rapid Heart Rate, Shortness of Breath and Swelling of Extremities. Gastrointestinal Not Present- Abdominal Pain, Bloating, Bloody Stool, Change in Bowel Habits, Chronic diarrhea, Constipation, Difficulty Swallowing, Excessive gas, Gets full quickly at meals, Hemorrhoids, Indigestion, Nausea, Rectal Pain and Vomiting. Female Genitourinary Not Present- Frequency, Nocturia, Painful Urination, Pelvic Pain and Urgency. Musculoskeletal Present- Back Pain. Not Present- Joint Pain, Joint Stiffness, Muscle Pain, Muscle Weakness and Swelling of Extremities. Neurological Present- Headaches. Not Present- Decreased Memory, Fainting, Numbness,  Seizures, Tingling, Tremor, Trouble walking and Weakness. Psychiatric Present- Anxiety and Depression. Not Present- Bipolar, Change in Sleep Pattern, Fearful and Frequent crying. Endocrine Not Present- Cold Intolerance, Excessive Hunger, Hair Changes, Heat Intolerance, Hot flashes and New Diabetes. Hematology Not Present- Easy Bruising, Excessive bleeding, Gland problems, HIV and Persistent Infections. All other systems negative  Vitals Weight: 108 lb Height: 67in Body Surface Area: 1.56 m Body Mass Index: 16.92 kg/m  Temp.: 98.81F(Temporal)  Pulse: 74 (Regular)  BP: 128/82 (Sitting, Left Arm, Standard)       Physical Exam General Mental Status-Alert. General Appearance-Consistent with stated age. Hydration-Well hydrated. Voice-Normal.  Head and Neck Head-normocephalic, atraumatic with no lesions or palpable masses. Trachea-midline. Thyroid Gland Characteristics - normal size and consistency.  Eye Eyeball - Bilateral-Extraocular movements intact. Sclera/Conjunctiva - Bilateral-No scleral icterus.  Chest and Lung Exam Chest and lung exam reveals -quiet, even and easy respiratory effort with no use of accessory muscles and on auscultation, normal breath sounds, no adventitious sounds and normal vocal resonance. Inspection Chest Wall - Normal. Back - normal.  Breast Note: ptotic breasts bilaterally. left breast with hematoma in upper breast, around 1 cm. the patient has dense breast tissue in both breasts in the upper outer quadrant. There is no nipple retraction or skin dimpling. No nipple discharge. No LAD.   Cardiovascular Cardiovascular examination reveals -normal heart sounds, regular rate and rhythm with no murmurs and normal pedal pulses bilaterally.  Abdomen Inspection Inspection of the abdomen reveals - No Hernias. Palpation/Percussion Palpation and Percussion of the abdomen reveal - Soft, Non Tender, No Rebound tenderness,  No Rigidity (guarding) and No hepatosplenomegaly. Auscultation Auscultation of the abdomen reveals - Bowel sounds normal.  Neurologic Neurologic evaluation reveals -alert and oriented x 3 with no impairment of recent or remote memory. Mental Status-Normal.  Musculoskeletal Global Assessment -Note: no gross deformities.  Normal Exam - Left-Upper Extremity Strength Normal and Lower Extremity Strength Normal. Normal Exam - Right-Upper Extremity Strength Normal and Lower Extremity Strength Normal.  Lymphatic Head & Neck  General Head & Neck Lymphatics: Bilateral -  Description - Normal. Axillary  General Axillary Region: Bilateral - Description - Normal. Tenderness - Non Tender. Femoral & Inguinal  Generalized Femoral & Inguinal Lymphatics: Bilateral - Description - No Generalized lymphadenopathy.    Assessment & Plan  BREAST CANCER OF UPPER-INNER QUADRANT OF LEFT FEMALE BREAST (C50.212) Impression: Patient will need a left mastectomy due to the location of the cancer in one quadrant and the ADH with many calcs in another quadrant. This would be a poor cosmetic result to try to do 2 lumpectomies even if we pursued multiple other biopsied of the calcs and they were not DCIS. The patient desires reconstruction and desired bilateral mastectomies. She has very ptotic breasts and would have poor symmetry on the right side without significant reconstruction. Also, she has a strong family history of breast cancer.  I reviewed that I would refer her to plastic surgery. She desires to see Dr. Iran Planas based on a recommendation from her PCP's PA. I discussed that sometimes this can take a while to schedule the operation because it is a long procedure.  Also, the patient is a smoker. I discussed that she may need to be completely off cigarettes in order to have reconstruction, and that this is a requirement of many plastic surgeons based on increased risks of wound dehiscence and  infection.  If there is a significant delay, we may need to put her on antiestrogen therapy while awaiting surgery. I will refer her to Dr. Hinton Rao in [place] to discuss.  I reviewed mastectomy with the patient. I discussed that she would get a pectoral block and a sentinel node injection in the pre op holding area. She would then go back to the OR and be put to sleep. We would then do the mastectomies and either close or leave for Dr. Iran Planas to do reconstruction. She would need to be in hospital at least over night. I discussed risk of bleeding, infection, damage to adjacent structures, numbness, wound breakdown, heart or lung problems, possible need to return to the OR, possible seroma, and more.  She would like to delay just a little bit in order to increase the amount of PAL time she has at work. 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 Current Plans Pt Education - [facility] Breast Cancer Information Given - [facility] "Breast Journey" Package You are being scheduled for surgery - Our schedulers will call you.  You should hear from our office's scheduling department within 5 working days about the location, date, and time of surgery. We try to make accommodations for patient's preferences in scheduling surgery, but sometimes the OR schedule or the surgeon's schedule prevents [name] from making those accommodations.  If you have not heard from our office [phone]) in 5 working days, call the office and ask for your surgeon's nurse.  If you have other questions about your diagnosis, plan, or surgery, call the office and ask for your surgeon's nurse.  Referred to Surgery - Plastic, for evaluation and follow up (Plastic Surgery). Routine. Referred to Oncology, for evaluation and follow up (Oncology). Routine. Referred to Genetic Counseling, for evaluation and follow up [employer]). Routine. Pt Education - [facility] breast cancer surgery: discussed with patient and provided information. Pt  Education - Smoking: Ways to Quit: smoking cessation   Signed by Stark Klein, MD

## 2015-06-02 NOTE — Progress Notes (Signed)
GENETIC TEST RESULT  HPI: Ms. Pressly was previously seen in the [facility] clinic due to a personal history of breast cancer and basal cell carcinoma, family history of breast, colon, and other cancers, and concerns regarding a hereditary predisposition to cancer. Please refer to our prior cancer genetics clinic note from April 26, 2015 for more information regarding Ms. Okun's medical, social and family histories, and our assessment and recommendations, at the time. Ms. Wesenberg recent genetic test results were disclosed to her, as were recommendations warranted by these results. These results and recommendations are discussed in more detail below.  GENETIC TEST RESULTS: At the time of Ms. Wentworth's visit on 04/26/15, we recommended she pursue genetic testing of the 20-gene Breast/Ovarian Cancer Panel through [facility].  The Breast/Ovarian Cancer Panel offered by [facility] Hope Pigeon, MD) includes sequencing and deletion/duplication analysis for the following 19 genes:  ATM, BARD1, BRCA1, BRCA2, BRIP1, CDH1, CHEK2, FANCC, MLH1, MSH2, MSH6, NBN, PALB2, PMS2, PTEN, RAD51C, RAD51D, TP53, and XRCC2.  This panel also includes deletion/duplication analysis (without sequencing) for one gene, EPCAM.  Those results are now back, the report date for which is May 13, 2015.  Genetic testing was normal, and did not reveal a deleterious mutation in these genes. Additionally, no variants of uncertain significance (VUSes) were found.  The test report will be scanned into EPIC and will be located under the Results Review tab in the Pathology>Molecular Pathology section.   We discussed with Ms. Card that since the current genetic testing is not perfect, it is possible there may be a gene mutation in one of these genes that current testing cannot detect, but that chance is small. We also discussed, that it is possible that another gene that has not yet been discovered, or  that we have not yet tested, is responsible for the cancer diagnoses in the family, and it is, therefore, important to remain in touch with cancer genetics in the future so that we can continue to offer Ms. Langill the most up-to-date genetic testing.    CANCER SCREENING RECOMMENDATIONS: This result is reassuring and indicates that Ms. Rens likely does not have an increased risk for a future cancer due to a mutation in one of these genes. This normal test also suggests that Ms. Force's cancer was most likely not due to an inherited predisposition associated with one of these genes.  Most cancers happen by chance and this negative test suggests that her cancer falls into this category.  We, therefore, recommended she continue to follow the cancer management and screening guidelines provided by her oncology and primary healthcare providers.   RECOMMENDATIONS FOR FAMILY MEMBERS: Women in this family might be at some increased risk of developing cancer, over the general population risk, simply due to the family history of cancer. We recommended women in this family have a yearly mammogram beginning at age 18, or 100 years younger than the earliest onset of cancer, an an annual clinical breast exam, and perform monthly breast self-exams. Women in this family should also have a gynecological exam as recommended by their primary provider. All family members should have a colonoscopy by age 41.  FOLLOW-UP: Lastly, we discussed with Ms. Raine that cancer genetics is a rapidly advancing field and it is possible that new genetic tests will be appropriate for her and/or her family members in the future. We encouraged her to remain in contact with cancer genetics on an annual basis so we can update her personal and  family histories and let her know of advances in cancer genetics that may benefit this family.   Our contact number was provided. Ms. Trueba questions were answered to her satisfaction, and she  knows she is welcome to call us at anytime with additional questions or concerns.   Jeanine Luz, MS, [facility] Certified Genetic Counselor [email or web address] Phone: [phone]

## 2015-06-02 NOTE — Transfer of Care (Signed)
Immediate Anesthesia Transfer of Care Note  Patient: Anne [name]  Procedure(s) Performed: Procedure(s): BILATERAL MASTECTOMY WITH LEFT SENTINEL LYMPH NODE BIOPSY (Bilateral) BILATERAL BREAST RECONSTRUCTION WITH PLACEMENT OF TISSUE EXPANDER AND  ACELLULAR HYDRATED DERMIS (Bilateral)  Patient Location: PACU  Anesthesia Type:GA combined with regional for post-op pain  Level of Consciousness: sedated and patient cooperative  Airway & Oxygen Therapy: Patient Spontanous Breathing and Patient connected to face mask oxygen  Post-op Assessment: Report given to RN and Post -op Vital signs reviewed and stable  Post vital signs: Reviewed and stable  Last Vitals:  Filed Vitals:   06/02/15 0730 06/02/15 0745  BP: 114/50 113/50  Pulse: 75 76  Temp:    Resp: 15 16    Complications: No apparent anesthesia complications

## 2015-06-02 NOTE — Op Note (Signed)
Bilateral Skin Sparing Mastectomies with Left Sentinel Node Biopsy   Indications: This patient presents with history of Left breast cancer with clinically negative axillary lymph node exam.  Pre-operative Diagnosis: left breast cancer, [id number]   Post-operative Diagnosis: left breast cancer, same  Surgeon: Stark Klein   Anesthesia: General endotracheal anesthesia and Local anesthesia 0.25.% bupivacaine, with epinephrine  ASA Class: 2  Procedure Details  The patient was seen in the Holding Room. The risks, benefits, complications, treatment options, and expected outcomes were discussed with the patient. The possibilities of reaction to medication, pulmonary aspiration, bleeding, infection, the need for additional procedures, failure to diagnose a condition, and creating a complication requiring transfusion or operation were discussed with the patient. The patient concurred with the proposed plan, giving informed consent.  The site of surgery properly noted/marked. The patient was taken to Operating Room # 8, identified as Anne [name], and the procedure verified as Bilateral Mastectomy and left Sentinel Node Biopsy. A Time Out was held and the above information confirmed.    After induction of anesthesia, the right arm, breast, and chest were prepped and draped in standard fashion.  The right side was addressed first. The borders of the breast were identified and marked.  An elliptical incision was made with the #10 blade.   Mastectomy hooks were used to provide elevation of the skin edges, and the cautery was used to create the mastectomy flaps.  The dissection was taken to the fascia of the pectoralis major.  The penetrating vessels were clipped.  The superior flap was taken medially to the lateral sternal border, superiorly to the inferior border of the clavicle.  The inferior flap was similarly created, inferiorly to the inframammary fold and laterally to the border of the latissimus.  The  breast was taken off including the pectoralis fascia and the axillary tail marked.    The left side was addressed similarly.  After the breast was removed, the sentinel lymph node biopsy was performed.  The neoprobe was used to locate the axillary sentinel nodes.  Three level 2 axillary sentinel nodes were removed and submitted to pathology.  The findings are below.  The lymphovascular channels were clipped with metal clips.        The wound was irrigated. Hemostasis was achieved with cautery.  The patient was left with Dr. Iran Planas to do expander reconstruction.   Findings: grossly clear surgical margins.  SLN #1 was hot with cps 220.  SLN #2 was hot with cps 60.  SLN #3 palpable.    Estimated Blood Loss:  less than 50 mL         Drains: per Dr. Iran Planas                Specimens: R breast, left breast, and 3 left axillary sentinel nodes         Complications:  None; patient tolerated the procedure well.         Disposition: PACU - hemodynamically stable.         Condition: stable

## 2015-06-02 NOTE — Anesthesia Postprocedure Evaluation (Addendum)
Anesthesia Post Note  Patient: Anne [name]  Procedure(s) Performed: Procedure(s) (LRB): BILATERAL MASTECTOMY WITH LEFT SENTINEL LYMPH NODE BIOPSY (Bilateral) BILATERAL BREAST RECONSTRUCTION WITH PLACEMENT OF TISSUE EXPANDER AND  ACELLULAR HYDRATED DERMIS (Bilateral)  Patient location during evaluation: PACU Anesthesia Type: General Level of consciousness: awake Pain management: pain level not controlled Vital Signs Assessment: post-procedure vital signs reviewed and stable Respiratory status: spontaneous breathing Cardiovascular status: stable Postop Assessment: no signs of nausea or vomiting and adequate PO intake Anesthetic complications: no    Last Vitals:  Filed Vitals:   06/02/15 1400 06/02/15 1415  BP: 120/83 136/62  Pulse: 83 86  Temp:    Resp: 18 17    Last Pain:  Filed Vitals:   06/02/15 1422  PainSc: 8                  Lister Brizzi JR,JOHN Assia Meanor

## 2015-06-02 NOTE — Interval H&P Note (Signed)
History and Physical Interval Note:  06/02/2015 9:00 AM  Anne [name]  has presented today for surgery, with the diagnosis of LEFT BREAST CANCER, STRONG FAMILY HISTORY  The various methods of treatment have been discussed with the patient and family. After consideration of risks, benefits and other options for treatment, the patient has consented to  Procedure(s): BILATERAL MASTECTOMY WITH LEFT SENTINEL LYMPH NODE BIOPSY (Bilateral) BILATERAL BREAST RECONSTRUCTION WITH PLACEMENT OF TISSUE EXPANDER AND POSSIBLE ACELLULAR HYDRATED DERMIS (Bilateral) as a surgical intervention .  The patient's history has been reviewed, patient examined, no change in status, stable for surgery.  I have reviewed the patient's chart and labs.  Questions were answered to the patient's satisfaction.     Anne [name]

## 2015-06-02 NOTE — Op Note (Signed)
Operative Note   DATE OF OPERATION: 3.30.17  LOCATION: [place]- observation  SURGICAL DIVISION: Plastic Surgery  PREOPERATIVE DIAGNOSES:  1. Left breast cancer  POSTOPERATIVE DIAGNOSES:  same  PROCEDURE:  1. Bilateral breast reconstruction with tissue expanders 2. Acellular dermis for breast reconstruction (Flex HD) total 100 cm2.  SURGEON: Irene Limbo MD MBA  ASSISTANT: none  ANESTHESIA:  General.   EBL: 100 ml for entire case  COMPLICATIONS: None immediate.   INDICATIONS FOR PROCEDURE:  The patient, Anne [name], is a 70 y.o. female born on 1946/02/27, is here for  Immediate expander based reconstruction following bilateral skin sparing mastectomies.   FINDINGS: Natrelle 133MX-12-T , 400 ml tissue expanders placed, Initial fill volume 200 ml bilateral. RIGHT SN [id number] LEFT [id number]  DESCRIPTION OF PROCEDURE:  The patient was marked to mark sternal notch, chest midline, anterior axillary lines and inframammary folds. The patient was taken to the operating room. SCDs were placed and IV antibiotics were given. The patient's operative site was prepped and draped in a sterile fashion. A time out was performed and all information was confirmed to be correct. Following completion mastectomies, reconstruction began on right side. The inferior insertions of pectoralis major muscle were elevated continuous with abdominal wall fascia. Submuscular dissection completed toward clavicle. Flex HD was perforated and sewn to inferior border of pectoralis major with running 3-0 vicryl. A 19 Fr drain was placed in subcutaneous position laterally and submuscular position medialy and secured to skin with 2-0 nylon. The cavity was irrigated with solution containing polymyxin and bacitracin. Hemostasis was ensured. The tissue expander was prepared and placed in submuscular position. The expander was secured to chest wall with a 3-0 vicryl. The inferior border of the acellular dermis was  secured with interrupted 3-0 vicryl to Scarpa's fascia of inferior mastectomy flaps to reset the inframammary fold. Laterally the acelular deris was sewn to serratus fascia and muscle. The incision was closed with 3-0 vicryl in fascial layer and 4-0 vicryl in dermis. Skin closure completed with 4-0 monocryl subcuticular and tissue adhesive. Over left breast, similarly the inferior pectoralis insertions were divided . The acellular dermis was prepared and secured to inferior border of pectoralis with 3-0 vicryl. 19 Fr drain placed in cavity prior to placement of expander. The inferior and lateral borders of acellular dermis secured with Scarpa's fascia and chest wall. Closure completed in similar fashion.The ports were accessed and filled to 200 ml bilaterally. Tissue adhesive applied. Dry dressing and breast binder applied.  The patient was allowed to wake from anesthesia, extubated and taken to the recovery room in satisfactory condition.   SPECIMENS: none  DRAINS: 19 Fr JP in right and left reconstructed breast  Irene Limbo, MD [facility] Plastic & Reconstructive Surgery [phone]

## 2015-06-02 NOTE — Anesthesia Procedure Notes (Addendum)
Anesthesia Regional Block:  Pectoralis block  Pre-Anesthetic Checklist: ,, timeout performed, Correct Patient, Correct Site, Correct Laterality, Correct Procedure, Correct Position, site marked, Risks and benefits discussed,  Surgical consent,  Pre-op evaluation,  At surgeon's request and post-op pain management  Laterality: Right and Left  Prep: chloraprep       Needles:   Needle Type: Echogenic Needle     Needle Length: 9cm 9 cm Needle Gauge: 21 and 21 G    Additional Needles:  Procedures: ultrasound guided (picture in chart) Pectoralis block Narrative:  Start time: 06/02/2015 7:12 AM End time: 06/02/2015 7:26 AM Injection made incrementally with aspirations every 5 mL.  Performed by: Personally  Anesthesiologist: Suzette Battiest  Additional Notes: Bilateral pectoralis block performed after risks and benefits discussed. Pt tolerated well with no immediate complications.   Procedure Name: Intubation Date/Time: 06/02/2015 9:07 AM Performed by: Marrianne Mood Pre-anesthesia Checklist: Patient identified, Emergency Drugs available, Suction available, Patient being monitored and Timeout performed Patient Re-evaluated:Patient Re-evaluated prior to inductionOxygen Delivery Method: Circle System Utilized Preoxygenation: Pre-oxygenation with 100% oxygen Intubation Type: IV induction Ventilation: Mask ventilation without difficulty Laryngoscope Size: Miller and 3 Grade View: Grade II Tube type: Oral Tube size: 7.0 mm Number of attempts: 1 Airway Equipment and Method: Stylet and Oral airway Placement Confirmation: ETT inserted through vocal cords under direct vision,  positive ETCO2 and breath sounds checked- equal and bilateral Secured at: 20 cm Tube secured with: Tape Dental Injury: Teeth and Oropharynx as per pre-operative assessment

## 2015-06-02 NOTE — Interval H&P Note (Signed)
History and Physical Interval Note:  06/02/2015 7:04 AM  Anne [name]  has presented today for surgery, with the diagnosis of LEFT BREAST CANCER, STRONG FAMILY HISTORY  The various methods of treatment have been discussed with the patient and family. After consideration of risks, benefits and other options for treatment, the patient has consented to  Procedure(s): BILATERAL MASTECTOMY WITH LEFT SENTINEL LYMPH NODE BIOPSY (Bilateral) BILATERAL BREAST RECONSTRUCTION WITH PLACEMENT OF TISSUE EXPANDER AND POSSIBLE ACELLULAR HYDRATED DERMIS (Bilateral) as a surgical intervention .  The patient's history has been reviewed, patient examined, no change in status, stable for surgery.  I have reviewed the patient's chart and labs.  Questions were answered to the patient's satisfaction.     Paulett Kaufhold

## 2015-06-02 NOTE — Progress Notes (Signed)
Assisted Dr. Rodman Comp with right, left, ultrasound guided, pectoralis block also nuc med tech [facility] with nuc med inj . Side rails up, monitors on throughout procedure. See vital signs in flow sheet. Tolerated Procedure well.

## 2015-06-03 ENCOUNTER — Encounter (HOSPITAL_BASED_OUTPATIENT_CLINIC_OR_DEPARTMENT_OTHER): Payer: Self-pay | Admitting: General Surgery

## 2015-06-03 DIAGNOSIS — C50412 Malignant neoplasm of upper-outer quadrant of left female breast: Secondary | ICD-10-CM | POA: Diagnosis not present

## 2015-06-03 DIAGNOSIS — F329 Major depressive disorder, single episode, unspecified: Secondary | ICD-10-CM | POA: Diagnosis not present

## 2015-06-03 DIAGNOSIS — J449 Chronic obstructive pulmonary disease, unspecified: Secondary | ICD-10-CM | POA: Diagnosis not present

## 2015-06-03 DIAGNOSIS — L821 Other seborrheic keratosis: Secondary | ICD-10-CM | POA: Diagnosis not present

## 2015-06-03 DIAGNOSIS — Z170 Estrogen receptor positive status [ER+]: Secondary | ICD-10-CM | POA: Diagnosis not present

## 2015-06-03 DIAGNOSIS — C50212 Malignant neoplasm of upper-inner quadrant of left female breast: Secondary | ICD-10-CM | POA: Diagnosis not present

## 2015-06-03 MED ORDER — CYCLOBENZAPRINE HCL 10 MG PO TABS: 10.0000 mg | ORAL_TABLET | Freq: Three times a day (TID) | ORAL | Status: DC | PRN

## 2015-06-03 NOTE — Discharge Instructions (Signed)

## 2015-06-04 NOTE — Progress Notes (Signed)
Quick Note:  Please let patient know margins and LN are negative. tx FB  ______

## 2015-06-07 ENCOUNTER — Encounter (HOSPITAL_BASED_OUTPATIENT_CLINIC_OR_DEPARTMENT_OTHER): Payer: Self-pay | Admitting: General Surgery

## 2015-06-08 DIAGNOSIS — Z9013 Acquired absence of bilateral breasts and nipples: Secondary | ICD-10-CM | POA: Insufficient documentation

## 2015-06-08 HISTORY — DX: Acquired absence of bilateral breasts and nipples: Z90.13

## 2015-06-10 ENCOUNTER — Encounter (HOSPITAL_BASED_OUTPATIENT_CLINIC_OR_DEPARTMENT_OTHER): Payer: Self-pay | Admitting: General Surgery

## 2015-06-15 NOTE — H&P (Signed)
  Subjective:    Patient ID: Anne [name] is a 70 y.o. female.  HPI  2 weeks post op bilateral mastectomies with TE placement.. Seen by Dr. Barry Dienes this week and re placed on antibiotics for concern erythema surrounding eschar. No fevers. Tried oxycodone for pain and said it was like taking an aspirin, hydrocodone more effective. She drove herself today.  Drains-forgot log.  Presented following screening MMG with calcifications left UOQ. Biopsy with ADH and second biopsy in area benign. MRI with extremely dense tissue and 13 mm mass at 01-1129 position with extensive background enhancement. Second look [name] with 9 mm mass, biopsy with IDC ER/PR +, Her 2 -. Oncology at [facility] with Dr. Hinton Rao. Final pathology right benign, left 1 cm IDC with DCIS, margins clear, negative SLN.   Genetics negative.  Prior 40 D. Mastectomy right 717 g left 634 g  Stopped smoking March 1.  Review of Systems     Objective:   Physical Exam  Cardiovascular: Normal rate, regular rhythm and normal heart sounds.  Pulmonary/Chest: Effort normal and breath sounds normal.   Chest: over right black eschar medially and new (this am) drainage from lateral extent scar with superficial separation incision for 2 cm, no gross exposure expander and drain still holding suction Over left peri incision redness, feel this is consistent with ischemic incision margins Drains largely serous with fat globules    Assessment:     Left breast ca S/p bilateral mastectomies, TE/ADM reconstruction    Plan:     Bilateral mastectomy flap necrosis and threatened exposure expanders. Plan debridement. If any exposure expanders, will need replacement. Plan OP surgery, scheduled for 4.14.17. Counseled patient she will need someone in home with her night of surgery if she desires to go home after procedure.   Natrelle 133MX-12-T , 400 ml tissue expanders placed,  Initial fill volume 200 ml bilateral.  Irene Limbo,  MD [facility] Plastic & Reconstructive Surgery [phone]

## 2015-06-16 ENCOUNTER — Encounter (HOSPITAL_COMMUNITY): Payer: Self-pay | Admitting: *Deleted

## 2015-06-16 MED ORDER — VANCOMYCIN HCL IN DEXTROSE 1-5 GM/200ML-% IV SOLN
1000.0000 mg | INTRAVENOUS | Status: AC
  Administered 2015-06-17: 1000 mg via INTRAVENOUS
  Filled 2015-06-16: qty 200

## 2015-06-16 NOTE — Progress Notes (Signed)
Pt denies cardiac history, chest pain or sob. 

## 2015-06-17 ENCOUNTER — Encounter (HOSPITAL_COMMUNITY)
Admission: RE | Disposition: A | Discharge status: Home / Self Care | Payer: Self-pay | Source: Ambulatory Visit | Attending: Plastic Surgery

## 2015-06-17 ENCOUNTER — Ambulatory Visit (HOSPITAL_COMMUNITY): Payer: Medicare Other | Admitting: Certified Registered Nurse Anesthetist

## 2015-06-17 ENCOUNTER — Ambulatory Visit (HOSPITAL_COMMUNITY)
Admission: RE | Admit: 2015-06-17 | Discharge: 2015-06-17 | Disposition: A | Discharge status: Home / Self Care | Payer: Medicare Other | Source: Ambulatory Visit | Attending: Plastic Surgery | Admitting: Plastic Surgery

## 2015-06-17 DIAGNOSIS — Z9013 Acquired absence of bilateral breasts and nipples: Secondary | ICD-10-CM | POA: Diagnosis not present

## 2015-06-17 DIAGNOSIS — I96 Gangrene, not elsewhere classified: Secondary | ICD-10-CM | POA: Insufficient documentation

## 2015-06-17 DIAGNOSIS — Z853 Personal history of malignant neoplasm of breast: Secondary | ICD-10-CM | POA: Diagnosis not present

## 2015-06-17 DIAGNOSIS — C50912 Malignant neoplasm of unspecified site of left female breast: Secondary | ICD-10-CM | POA: Diagnosis not present

## 2015-06-17 DIAGNOSIS — Z79899 Other long term (current) drug therapy: Secondary | ICD-10-CM | POA: Diagnosis not present

## 2015-06-17 DIAGNOSIS — J449 Chronic obstructive pulmonary disease, unspecified: Secondary | ICD-10-CM | POA: Diagnosis not present

## 2015-06-17 DIAGNOSIS — Z7951 Long term (current) use of inhaled steroids: Secondary | ICD-10-CM | POA: Diagnosis not present

## 2015-06-17 DIAGNOSIS — Z79810 Long term (current) use of selective estrogen receptor modulators (SERMs): Secondary | ICD-10-CM | POA: Insufficient documentation

## 2015-06-17 DIAGNOSIS — Z7982 Long term (current) use of aspirin: Secondary | ICD-10-CM | POA: Diagnosis not present

## 2015-06-17 DIAGNOSIS — F17200 Nicotine dependence, unspecified, uncomplicated: Secondary | ICD-10-CM | POA: Insufficient documentation

## 2015-06-17 DIAGNOSIS — T85898A Other specified complication of other internal prosthetic devices, implants and grafts, initial encounter: Secondary | ICD-10-CM | POA: Diagnosis not present

## 2015-06-17 DIAGNOSIS — L7682 Other postprocedural complications of skin and subcutaneous tissue: Secondary | ICD-10-CM | POA: Diagnosis not present

## 2015-06-17 DIAGNOSIS — F329 Major depressive disorder, single episode, unspecified: Secondary | ICD-10-CM | POA: Insufficient documentation

## 2015-06-17 HISTORY — DX: Vitamin D deficiency, unspecified: E55.9

## 2015-06-17 HISTORY — PX: REMOVAL OF BILATERAL TISSUE EXPANDERS WITH PLACEMENT OF BILATERAL BREAST IMPLANTS: SHX6431

## 2015-06-17 LAB — CBC
HCT: 29 % — ABNORMAL LOW (ref 36.0–46.0)
HEMOGLOBIN: 8.8 g/dL — AB (ref 12.0–15.0)
MCH: 26.7 pg (ref 26.0–34.0)
MCHC: 30.3 g/dL (ref 30.0–36.0)
MCV: 88.1 fL (ref 78.0–100.0)
PLATELETS: 382 10*3/uL (ref 150–400)
RBC: 3.29 MIL/uL — AB (ref 3.87–5.11)
RDW: 14.1 % (ref 11.5–15.5)
WBC: 9.8 10*3/uL (ref 4.0–10.5)

## 2015-06-17 SURGERY — REMOVAL, TISSUE EXPANDER, BREAST, BILATERAL, WITH BILATERAL IMPLANT IMPLANT INSERTION
Anesthesia: General | Laterality: Bilateral

## 2015-06-17 MED ORDER — ROCURONIUM BROMIDE 50 MG/5ML IV SOLN
INTRAVENOUS | Status: AC
  Filled 2015-06-17: qty 1

## 2015-06-17 MED ORDER — LACTATED RINGERS IV SOLN
INTRAVENOUS | Status: DC
Start: 2015-06-17 — End: 2015-06-17
  Administered 2015-06-17 (×3): via INTRAVENOUS

## 2015-06-17 MED ORDER — PROPOFOL 10 MG/ML IV BOLUS
INTRAVENOUS | Status: DC | PRN
  Administered 2015-06-17: 80 mg via INTRAVENOUS

## 2015-06-17 MED ORDER — HYDROMORPHONE HCL 1 MG/ML IJ SOLN: 0.2500 mg | INTRAMUSCULAR | Status: DC | PRN

## 2015-06-17 MED ORDER — PROPOFOL 10 MG/ML IV BOLUS
INTRAVENOUS | Status: AC
  Filled 2015-06-17: qty 20

## 2015-06-17 MED ORDER — ONDANSETRON HCL 4 MG/2ML IJ SOLN
INTRAMUSCULAR | Status: DC | PRN
  Administered 2015-06-17: 4 mg via INTRAVENOUS

## 2015-06-17 MED ORDER — 0.9 % SODIUM CHLORIDE (POUR BTL) OPTIME
TOPICAL | Status: DC | PRN
  Administered 2015-06-17: 1000 mL

## 2015-06-17 MED ORDER — CIPROFLOXACIN HCL 500 MG PO TABS: 500.0000 mg | ORAL_TABLET | Freq: Two times a day (BID) | ORAL | Status: DC

## 2015-06-17 MED ORDER — ROCURONIUM BROMIDE 100 MG/10ML IV SOLN
INTRAVENOUS | Status: DC | PRN
  Administered 2015-06-17: 50 mg via INTRAVENOUS

## 2015-06-17 MED ORDER — SODIUM CHLORIDE 0.9 % IV SOLN
INTRAVENOUS | Status: DC | PRN
  Administered 2015-06-17 (×2): 500 mL

## 2015-06-17 MED ORDER — MIDAZOLAM HCL 2 MG/2ML IJ SOLN
INTRAMUSCULAR | Status: AC
  Filled 2015-06-17: qty 2

## 2015-06-17 MED ORDER — LIDOCAINE HCL (CARDIAC) 20 MG/ML IV SOLN
INTRAVENOUS | Status: AC
  Filled 2015-06-17: qty 5

## 2015-06-17 MED ORDER — LIDOCAINE HCL (CARDIAC) 20 MG/ML IV SOLN
INTRAVENOUS | Status: DC | PRN
  Administered 2015-06-17: 20 mg via INTRAVENOUS

## 2015-06-17 MED ORDER — MIDAZOLAM HCL 5 MG/5ML IJ SOLN
INTRAMUSCULAR | Status: DC | PRN
  Administered 2015-06-17: 2 mg via INTRAVENOUS

## 2015-06-17 MED ORDER — MIDAZOLAM HCL 2 MG/2ML IJ SOLN: 0.5000 mg | Freq: Once | INTRAMUSCULAR | Status: DC | PRN

## 2015-06-17 MED ORDER — POVIDONE-IODINE 10 % EX SOLN
CUTANEOUS | Status: DC | PRN
  Administered 2015-06-17: 3 via TOPICAL

## 2015-06-17 MED ORDER — EPHEDRINE SULFATE 50 MG/ML IJ SOLN
INTRAMUSCULAR | Status: DC | PRN
Start: 2015-06-17 — End: 2015-06-17
  Administered 2015-06-17: 5 mg via INTRAVENOUS
  Administered 2015-06-17 (×2): 10 mg via INTRAVENOUS

## 2015-06-17 MED ORDER — PHENYLEPHRINE HCL 10 MG/ML IJ SOLN
10.0000 mg | INTRAVENOUS | Status: DC | PRN
  Administered 2015-06-17: 10 ug/min via INTRAVENOUS

## 2015-06-17 MED ORDER — HYDROCODONE-ACETAMINOPHEN 5-325 MG PO TABS
2.0000 | ORAL_TABLET | Freq: Once | ORAL | Status: AC
  Administered 2015-06-17: 2 via ORAL

## 2015-06-17 MED ORDER — PHENYLEPHRINE HCL 10 MG/ML IJ SOLN
INTRAMUSCULAR | Status: DC | PRN
  Administered 2015-06-17 (×3): 40 ug via INTRAVENOUS
  Administered 2015-06-17: 80 ug via INTRAVENOUS

## 2015-06-17 MED ORDER — MEPERIDINE HCL 25 MG/ML IJ SOLN: 6.2500 mg | INTRAMUSCULAR | Status: DC | PRN

## 2015-06-17 MED ORDER — FENTANYL CITRATE (PF) 100 MCG/2ML IJ SOLN
INTRAMUSCULAR | Status: DC | PRN
  Administered 2015-06-17: 200 ug via INTRAVENOUS
  Administered 2015-06-17: 50 ug via INTRAVENOUS

## 2015-06-17 MED ORDER — HYDROCODONE-ACETAMINOPHEN 5-325 MG PO TABS
ORAL_TABLET | ORAL | Status: AC
  Filled 2015-06-17: qty 2

## 2015-06-17 MED ORDER — SUGAMMADEX SODIUM 200 MG/2ML IV SOLN
INTRAVENOUS | Status: AC
  Filled 2015-06-17: qty 2

## 2015-06-17 MED ORDER — SUGAMMADEX SODIUM 200 MG/2ML IV SOLN
INTRAVENOUS | Status: DC | PRN
  Administered 2015-06-17: 100 mg via INTRAVENOUS

## 2015-06-17 MED ORDER — ONDANSETRON HCL 4 MG/2ML IJ SOLN
INTRAMUSCULAR | Status: AC
  Filled 2015-06-17: qty 2

## 2015-06-17 MED ORDER — FENTANYL CITRATE (PF) 250 MCG/5ML IJ SOLN
INTRAMUSCULAR | Status: AC
  Filled 2015-06-17: qty 5

## 2015-06-17 MED ORDER — HYDROCODONE-ACETAMINOPHEN 5-325 MG PO TABS: 1.0000 | ORAL_TABLET | ORAL | Status: DC | PRN

## 2015-06-17 SURGICAL SUPPLY — 59 items
BAG DECANTER FOR FLEXI CONT (MISCELLANEOUS) ×3 IMPLANT
BINDER BREAST LRG (GAUZE/BANDAGES/DRESSINGS) ×2 IMPLANT
BINDER BREAST XLRG (GAUZE/BANDAGES/DRESSINGS) IMPLANT
CANISTER SUCTION 2500CC (MISCELLANEOUS) ×3 IMPLANT
CHLORAPREP W/TINT 26ML (MISCELLANEOUS) ×3 IMPLANT
CLOSURE STERI-STRIP 1/2X4 (GAUZE/BANDAGES/DRESSINGS) ×1
CLSR STERI-STRIP ANTIMIC 1/2X4 (GAUZE/BANDAGES/DRESSINGS) ×2 IMPLANT
COVER SURGICAL LIGHT HANDLE (MISCELLANEOUS) ×3 IMPLANT
DRAIN CHANNEL 15F RND FF W/TCR (WOUND CARE) IMPLANT
DRAPE INCISE IOBAN 66X45 STRL (DRAPES) IMPLANT
DRAPE ORTHO SPLIT 77X108 STRL (DRAPES) ×6
DRAPE PROXIMA HALF (DRAPES) IMPLANT
DRAPE SURG ORHT 6 SPLT 77X108 (DRAPES) ×2 IMPLANT
DRAPE WARM FLUID 44X44 (DRAPE) ×3 IMPLANT
DRSG PAD ABDOMINAL 8X10 ST (GAUZE/BANDAGES/DRESSINGS) ×6 IMPLANT
DRSG TEGADERM 2-3/8X2-3/4 SM (GAUZE/BANDAGES/DRESSINGS) ×3 IMPLANT
DRSG TEGADERM 4X4.75 (GAUZE/BANDAGES/DRESSINGS) ×12 IMPLANT
ELECT BLADE 4.0 EZ CLEAN MEGAD (MISCELLANEOUS) ×3
ELECT CAUTERY BLADE 6.4 (BLADE) ×3 IMPLANT
ELECT COATED BLADE 2.86 ST (ELECTRODE) ×3 IMPLANT
ELECT REM PT RETURN 9FT ADLT (ELECTROSURGICAL) ×3
ELECTRODE BLDE 4.0 EZ CLN MEGD (MISCELLANEOUS) ×1 IMPLANT
ELECTRODE REM PT RTRN 9FT ADLT (ELECTROSURGICAL) ×1 IMPLANT
EVACUATOR SILICONE 100CC (DRAIN) IMPLANT
EXPANDER TISSUE MX 400CC (Breast) IMPLANT
GAUZE SPONGE 4X4 12PLY STRL (GAUZE/BANDAGES/DRESSINGS) ×3 IMPLANT
GLOVE BIO SURGEON STRL SZ 6 (GLOVE) ×9 IMPLANT
GOWN STRL REUS W/ TWL LRG LVL3 (GOWN DISPOSABLE) ×2 IMPLANT
GOWN STRL REUS W/TWL LRG LVL3 (GOWN DISPOSABLE) ×6
KIT BASIN OR (CUSTOM PROCEDURE TRAY) ×3 IMPLANT
KIT ROOM TURNOVER OR (KITS) ×3 IMPLANT
LIQUID BAND (GAUZE/BANDAGES/DRESSINGS) ×6 IMPLANT
MARKER SKIN DUAL TIP RULER LAB (MISCELLANEOUS) ×3 IMPLANT
NS IRRIG 1000ML POUR BTL (IV SOLUTION) ×6 IMPLANT
PACK GENERAL/GYN (CUSTOM PROCEDURE TRAY) ×3 IMPLANT
PAD ABD 8X10 STRL (GAUZE/BANDAGES/DRESSINGS) ×2 IMPLANT
PAD ARMBOARD 7.5X6 YLW CONV (MISCELLANEOUS) ×3 IMPLANT
PIN SAFETY STERILE (MISCELLANEOUS) ×3 IMPLANT
SET ASEPTIC TRANSFER (MISCELLANEOUS) ×5 IMPLANT
SOLUTION BETADINE 4OZ (MISCELLANEOUS) ×3 IMPLANT
STAPLER VISISTAT 35W (STAPLE) ×3 IMPLANT
SUT ETHILON 2 0 FS 18 (SUTURE) ×4 IMPLANT
SUT ETHILON 4 0 PS 2 18 (SUTURE) ×4 IMPLANT
SUT MNCRL 3 0 RB1 (SUTURE) IMPLANT
SUT MNCRL AB 4-0 PS2 18 (SUTURE) IMPLANT
SUT MON AB 5-0 PS2 18 (SUTURE) IMPLANT
SUT MONOCRYL 3 0 RB1 (SUTURE)
SUT VIC AB 3-0 SH 27 (SUTURE) ×6
SUT VIC AB 3-0 SH 27X BRD (SUTURE) IMPLANT
SUT VIC AB 4-0 PS2 27 (SUTURE) ×4 IMPLANT
SUT VICRYL 3 0 (SUTURE) IMPLANT
SUT VICRYL 4-0 PS2 18IN ABS (SUTURE) IMPLANT
SYR BULB IRRIGATION 50ML (SYRINGE) ×3 IMPLANT
TISSUE EXPANDER MX 400CC (Breast) ×6 IMPLANT
TOWEL OR 17X24 6PK STRL BLUE (TOWEL DISPOSABLE) ×3 IMPLANT
TOWEL OR 17X26 10 PK STRL BLUE (TOWEL DISPOSABLE) ×3 IMPLANT
TRAY FOLEY CATH 16FRSI W/METER (SET/KITS/TRAYS/PACK) IMPLANT
TUBE CONNECTING 12'X1/4 (SUCTIONS) ×1
TUBE CONNECTING 12X1/4 (SUCTIONS) ×2 IMPLANT

## 2015-06-17 NOTE — Transfer of Care (Signed)
Immediate Anesthesia Transfer of Care Note  Patient: Anne [name]  Procedure(s) Performed: Procedure(s): DEBRIDEMENT OF BILATERAL MASTECTOMY FLAP WITH BILATERAL TISSUE EXPANDER EXCHANGE  (Bilateral)  Patient Location: PACU  Anesthesia Type:General  Level of Consciousness: awake, alert  and oriented  Airway & Oxygen Therapy: Patient Spontanous Breathing and Patient connected to nasal cannula oxygen  Post-op Assessment: Report given to RN and Post -op Vital signs reviewed and stable  Post vital signs: Reviewed and stable  Last Vitals:  Filed Vitals:   06/17/15 0906  BP: 119/50  Pulse: 89  Temp: 37 C  Resp: 16    Complications: No apparent anesthesia complications

## 2015-06-17 NOTE — Anesthesia Procedure Notes (Signed)
Procedure Name: Intubation Date/Time: 06/17/2015 10:47 AM Performed by: Clearnce Sorrel Pre-anesthesia Checklist: Patient identified, Emergency Drugs available, Patient being monitored, Suction available and Timeout performed Patient Re-evaluated:Patient Re-evaluated prior to inductionOxygen Delivery Method: Circle system utilized Preoxygenation: Pre-oxygenation with 100% oxygen Intubation Type: IV induction Ventilation: Mask ventilation without difficulty Laryngoscope Size: Mac and 3 Grade View: Grade I Tube type: Oral Tube size: 7.0 mm Number of attempts: 1 Airway Equipment and Method: Stylet Placement Confirmation: ETT inserted through vocal cords under direct vision,  positive ETCO2 and breath sounds checked- equal and bilateral Secured at: 21 cm Tube secured with: Tape Dental Injury: Teeth and Oropharynx as per pre-operative assessment

## 2015-06-17 NOTE — Interval H&P Note (Signed)
History and Physical Interval Note:  06/17/2015 8:28 AM  Anne [name]  has presented today for surgery, with the diagnosis of [place]  The various methods of treatment have been discussed with the patient and family. After consideration of risks, benefits and other options for treatment, the patient has consented to  Procedure(s): DEBRIDEMENT OF BILATERAL MASTECTOMY FLAP POSSIBLE BILATERAL TISSUE EXPANDER EXCHANGE POSSIBLE EXPANSION (Bilateral) as a surgical intervention .  The patient's history has been reviewed, patient examined, no change in status, stable for surgery.  I have reviewed the patient's chart and labs.  Questions were answered to the patient's satisfaction.     Calin Fantroy

## 2015-06-17 NOTE — Anesthesia Preprocedure Evaluation (Addendum)
Anesthesia Evaluation  Patient identified by MRN, date of birth, ID band Patient awake    Reviewed: Allergy & Precautions, NPO status , Patient's Chart, lab work & pertinent test results  History of Anesthesia Complications (+) history of anesthetic complications (pt c/o sore throat from prior anesthetic a few weeks ago)  Airway Mallampati: II  TM Distance: >3 FB Neck ROM: Full    Dental  (+) Partial Upper, Edentulous Lower, Dental Advisory Given   Pulmonary COPD,  COPD inhaler, Current Smoker,    breath sounds clear to auscultation       Cardiovascular negative cardio ROS   Rhythm:Regular Rate:Normal     Neuro/Psych  Headaches (imitrex), Anxiety Depression TIA   GI/Hepatic negative GI ROS, Neg liver ROS,   Endo/Other  negative endocrine ROS  Renal/GU negative Renal ROS     Musculoskeletal   Abdominal   Peds  Hematology negative hematology ROS (+)   Anesthesia Other Findings Breast cancer  Reproductive/Obstetrics                            Anesthesia Physical Anesthesia Plan  ASA: III  Anesthesia Plan: General   Post-op Pain Management:    Induction: Intravenous  Airway Management Planned: Oral ETT  Additional Equipment:   Intra-op Plan:   Post-operative Plan: Extubation in OR  Informed Consent: I have reviewed the patients History and Physical, chart, labs and discussed the procedure including the risks, benefits and alternatives for the proposed anesthesia with the patient or authorized representative who has indicated his/her understanding and acceptance.   Dental advisory given  Plan Discussed with: CRNA and Surgeon  Anesthesia Plan Comments: (Plan routine monitors, GETA)        Anesthesia Quick Evaluation

## 2015-06-17 NOTE — Anesthesia Postprocedure Evaluation (Signed)
Anesthesia Post Note  Patient: Anne [name]  Procedure(s) Performed: Procedure(s) (LRB): DEBRIDEMENT OF BILATERAL MASTECTOMY FLAP WITH BILATERAL TISSUE EXPANDER EXCHANGE  (Bilateral)  Patient location during evaluation: PACU Anesthesia Type: General Level of consciousness: awake and alert, oriented and patient cooperative Pain management: pain level controlled Vital Signs Assessment: post-procedure vital signs reviewed and stable Respiratory status: spontaneous breathing, nonlabored ventilation, respiratory function stable and patient connected to nasal cannula oxygen Cardiovascular status: blood pressure returned to baseline and stable Postop Assessment: no signs of nausea or vomiting Anesthetic complications: no    Last Vitals:  Filed Vitals:   06/17/15 1330 06/17/15 1415  BP:  106/43  Pulse: 86 88  Temp:    Resp: 16 20    Last Pain:  Filed Vitals:   06/17/15 1423  PainSc: 0-No pain                 Akiem Urieta,E. Krystn Dermody

## 2015-06-17 NOTE — Op Note (Signed)
Operative Note   DATE OF OPERATION: 4.14.17  LOCATION: [facility]- outpatient  SURGICAL DIVISION: Plastic Surgery  PREOPERATIVE DIAGNOSES:  1. Left breast cancer 2. Acquired absence bilateral breasts 3. Skin flap necrosis bilateral  POSTOPERATIVE DIAGNOSES:  same  PROCEDURE:  1. Debridement skin and subcutaneous tissue mastectomy flaps chest total 40 cm2. 2. Removal and replacement tissue expanders for breast reconstruction bilateral.  SURGEON: Irene Limbo MD MBA  ASSISTANT: none  ANESTHESIA:  General.   EBL: 50 ml  COMPLICATIONS: None immediate.   INDICATIONS FOR PROCEDURE:  The patient, Anne [name], is a 70 y.o. female born on 1945-11-04, is here for debridement bilateral mastectomy flaps that demonstrate incision necrosis eschar and some superficial separation.   FINDINGS: Full thickness skin necrosis bilateral along majority of mastectomy scar, 2 weeks following bilateral mastectomies. Acellular dermis was not incorporated and appeared to be dissolving, was removed in its entirety bilateral. Natrelle 133 MX-12-T 400 ml tissue expanders replaced, initial fill volume 100 ml. Right NE [id number] Left SN [id number]  DESCRIPTION OF PROCEDURE:  The patient's operative site was marked with the patient in the preoperative area. The patient was taken to the operating room. SCDs were placed and IV antibiotics were given. The patient's operative site was prepped and draped in a sterile fashion. A time out was performed and all information was confirmed to be correct.  I began on left side. Sharp excision of eschar and ischemic skin and subcutaneous fat completed 2 x 10 cm total including prior incision. Following this there was gross exposure of expander and non incorporated acellular dermis. I elected to remove the acellular dermis and remove tissue expander. Cavity treated by curettage and irrigated with solution containing bacitracin and polymyxin. Incision made in developing capsule over  superior pole and submuscular dissection completed. New 19 Fr drain placed and secured to skin with 2-0 nylon. New tissue expander prepared and placed in cavity and secured to chest with 3-0 vicryl. Closure flaps completed with 3-0 vicryl to approximate superficial fascia and 4-0 vicryl in dermis. Skin closure completed with 4-0 nylon. I then directed my attention to right chest. Similar sharp excision of ischemic skin margins and eschar completed, 2 x 10 cm total, along entirety of incision margin. Following debridement, the superficial fascial layer was largely intact but non incorporated acellular dermis noted laterally with significant fluid. I removed the entirety of acellular dermis and the expander. Cavity treated by curettage and irrigated. New 19 Fr drain placed and new tissue expander prepared and placed in cavity and secured to chest with 3-0 vicryl. Soft tissue closure completed in similar manner.   Tissue adhesive followed by steri strips, dry dressing and breast binder applied. The patient was allowed to wake from anesthesia, extubated and taken to the recovery room in satisfactory condition.   SPECIMENS: right and left mastectomy flaps  DRAINS: 61 Fr JP in right and left chest  Irene Limbo, MD [facility] Plastic & Reconstructive Surgery [phone]

## 2015-06-21 ENCOUNTER — Encounter (HOSPITAL_COMMUNITY): Payer: Self-pay | Admitting: Plastic Surgery

## 2015-06-27 DIAGNOSIS — L7634 Postprocedural seroma of skin and subcutaneous tissue following other procedure: Secondary | ICD-10-CM | POA: Diagnosis not present

## 2015-06-27 DIAGNOSIS — J45909 Unspecified asthma, uncomplicated: Secondary | ICD-10-CM | POA: Diagnosis not present

## 2015-06-27 DIAGNOSIS — C50919 Malignant neoplasm of unspecified site of unspecified female breast: Secondary | ICD-10-CM | POA: Diagnosis not present

## 2015-06-27 DIAGNOSIS — Z9013 Acquired absence of bilateral breasts and nipples: Secondary | ICD-10-CM | POA: Diagnosis not present

## 2015-06-27 DIAGNOSIS — R079 Chest pain, unspecified: Secondary | ICD-10-CM | POA: Diagnosis not present

## 2015-06-27 DIAGNOSIS — J449 Chronic obstructive pulmonary disease, unspecified: Secondary | ICD-10-CM | POA: Diagnosis not present

## 2015-06-28 DIAGNOSIS — R079 Chest pain, unspecified: Secondary | ICD-10-CM | POA: Diagnosis not present

## 2015-06-30 DIAGNOSIS — C50912 Malignant neoplasm of unspecified site of left female breast: Secondary | ICD-10-CM | POA: Diagnosis not present

## 2015-06-30 DIAGNOSIS — C50212 Malignant neoplasm of upper-inner quadrant of left female breast: Secondary | ICD-10-CM | POA: Diagnosis not present

## 2015-07-04 ENCOUNTER — Encounter (HOSPITAL_BASED_OUTPATIENT_CLINIC_OR_DEPARTMENT_OTHER): Payer: Self-pay | Admitting: *Deleted

## 2015-07-04 ENCOUNTER — Encounter (HOSPITAL_BASED_OUTPATIENT_CLINIC_OR_DEPARTMENT_OTHER)
Admission: RE | Admit: 2015-07-04 | Discharge: 2015-07-04 | Disposition: A | Discharge status: Home / Self Care | Payer: Medicare Other | Source: Ambulatory Visit | Attending: Plastic Surgery | Admitting: Plastic Surgery

## 2015-07-04 DIAGNOSIS — Z01812 Encounter for preprocedural laboratory examination: Secondary | ICD-10-CM | POA: Diagnosis not present

## 2015-07-04 DIAGNOSIS — C50912 Malignant neoplasm of unspecified site of left female breast: Secondary | ICD-10-CM | POA: Diagnosis not present

## 2015-07-04 LAB — CBC
HCT: 35.6 % — ABNORMAL LOW (ref 36.0–46.0)
HEMOGLOBIN: 10.9 g/dL — AB (ref 12.0–15.0)
MCH: 27.4 pg (ref 26.0–34.0)
MCHC: 30.6 g/dL (ref 30.0–36.0)
MCV: 89.4 fL (ref 78.0–100.0)
PLATELETS: 307 10*3/uL (ref 150–400)
RBC: 3.98 MIL/uL (ref 3.87–5.11)
RDW: 16 % — ABNORMAL HIGH (ref 11.5–15.5)
WBC: 9.1 10*3/uL (ref 4.0–10.5)

## 2015-07-05 NOTE — H&P (Signed)
   Patient ID: Anne [name] is a 70 y.o. female.  HPI  6 weeks post op bilateral mastectomies for left breast cancer. Course complicated by bilateral mastectomy flap necrosis and now 12 days post bilateral debridement and expander exchange. ADM removed from both sides. She has been healing from this surgery but desires no further reconstruction efforts and removal expanders.   Presented following screening MMG with calcifications left UOQ. Biopsy with ADH and second biopsy in area benign. MRI with extremely dense tissue and 13 mm mass at 01-1129 position with extensive background enhancement. Second look [name] with 9 mm mass, biopsy with IDC ER/PR +, Her 2 -. Oncology at [facility] with Dr. Hinton Rao. Final pathology right benign, left 1 cm IDC with DCIS, margins clear, negative SLN.   Genetics negative.  Prior 40 D. Mastectomy right 717 g left 634 g  She has resumed smoking.  Review of Systems     Objective:   Physical Exam  Cardiovascular: Normal rate, regular rhythm and normal heart sounds.  Pulmonary/Chest: Effort normal and breath sounds normal.   Chest: incisions intact without drainage, chest soft      Assessment:     Left breast ca S/p bilateral mastectomies, TE/ADM reconstruction S/p debridement bilateral mastectomy flaps, TE exchange, removal ADM    Plan:      Plan removal bilateral tissue expanders and excision redundant mastectomy flaps. Will have drains post op again. Counseled will wait until after expanders out to provide Rx for prosthesis. Asked her to try to stop smoking as we will have to try to heal all of these incisions again in a few weeks. Reviewed risks wound healing problems, seroma, hematoma, infection, asymmetry, unacceptable cosmetic appearance, damage to deeper structures, DVT/PE, cardiopulmonary complications.  Natrelle 133 MX-12-T 400 ml tissue expanders replaced, initial fill volume 100 ml.    Irene Limbo, MD [facility] Plastic &  Reconstructive Surgery [phone]

## 2015-07-06 DIAGNOSIS — R600 Localized edema: Secondary | ICD-10-CM | POA: Diagnosis not present

## 2015-07-11 ENCOUNTER — Encounter (HOSPITAL_COMMUNITY): Payer: Self-pay

## 2015-07-11 DIAGNOSIS — C50212 Malignant neoplasm of upper-inner quadrant of left female breast: Secondary | ICD-10-CM | POA: Diagnosis not present

## 2015-07-12 ENCOUNTER — Ambulatory Visit (HOSPITAL_BASED_OUTPATIENT_CLINIC_OR_DEPARTMENT_OTHER): Payer: Medicare Other | Admitting: Anesthesiology

## 2015-07-12 ENCOUNTER — Encounter (HOSPITAL_BASED_OUTPATIENT_CLINIC_OR_DEPARTMENT_OTHER)
Admission: RE | Disposition: A | Discharge status: Home / Self Care | Payer: Self-pay | Source: Ambulatory Visit | Attending: Plastic Surgery

## 2015-07-12 ENCOUNTER — Ambulatory Visit (HOSPITAL_BASED_OUTPATIENT_CLINIC_OR_DEPARTMENT_OTHER)
Admission: RE | Admit: 2015-07-12 | Discharge: 2015-07-12 | Disposition: A | Discharge status: Home / Self Care | Payer: Medicare Other | Source: Ambulatory Visit | Attending: Plastic Surgery | Admitting: Plastic Surgery

## 2015-07-12 ENCOUNTER — Encounter (HOSPITAL_BASED_OUTPATIENT_CLINIC_OR_DEPARTMENT_OTHER): Payer: Self-pay

## 2015-07-12 DIAGNOSIS — J449 Chronic obstructive pulmonary disease, unspecified: Secondary | ICD-10-CM | POA: Diagnosis not present

## 2015-07-12 DIAGNOSIS — N641 Fat necrosis of breast: Secondary | ICD-10-CM | POA: Diagnosis not present

## 2015-07-12 DIAGNOSIS — F17200 Nicotine dependence, unspecified, uncomplicated: Secondary | ICD-10-CM | POA: Insufficient documentation

## 2015-07-12 DIAGNOSIS — Z853 Personal history of malignant neoplasm of breast: Secondary | ICD-10-CM | POA: Diagnosis not present

## 2015-07-12 DIAGNOSIS — Z45811 Encounter for adjustment or removal of right breast implant: Secondary | ICD-10-CM | POA: Diagnosis not present

## 2015-07-12 DIAGNOSIS — Z45812 Encounter for adjustment or removal of left breast implant: Secondary | ICD-10-CM | POA: Diagnosis not present

## 2015-07-12 DIAGNOSIS — Z9013 Acquired absence of bilateral breasts and nipples: Secondary | ICD-10-CM | POA: Insufficient documentation

## 2015-07-12 DIAGNOSIS — C50912 Malignant neoplasm of unspecified site of left female breast: Secondary | ICD-10-CM | POA: Diagnosis present

## 2015-07-12 HISTORY — PX: REMOVAL OF TISSUE EXPANDER: SHX6324

## 2015-07-12 SURGERY — REMOVAL, TISSUE EXPANDER
Anesthesia: General | Site: Breast | Laterality: Bilateral

## 2015-07-12 MED ORDER — PROPOFOL 10 MG/ML IV BOLUS
INTRAVENOUS | Status: DC | PRN
  Administered 2015-07-12 (×2): 100 mg via INTRAVENOUS
  Administered 2015-07-12 (×3): 50 mg via INTRAVENOUS

## 2015-07-12 MED ORDER — LIDOCAINE HCL (CARDIAC) 20 MG/ML IV SOLN
INTRAVENOUS | Status: DC | PRN
  Administered 2015-07-12: 50 mg via INTRAVENOUS

## 2015-07-12 MED ORDER — HYDROCODONE-ACETAMINOPHEN 5-325 MG PO TABS: 1.0000 | ORAL_TABLET | ORAL | Status: DC | PRN

## 2015-07-12 MED ORDER — CIPROFLOXACIN IN D5W 400 MG/200ML IV SOLN
INTRAVENOUS | Status: AC
  Filled 2015-07-12: qty 200

## 2015-07-12 MED ORDER — HYDROMORPHONE HCL 1 MG/ML IJ SOLN
INTRAMUSCULAR | Status: AC
  Filled 2015-07-12: qty 1

## 2015-07-12 MED ORDER — ONDANSETRON HCL 4 MG/2ML IJ SOLN
INTRAMUSCULAR | Status: AC
  Filled 2015-07-12: qty 2

## 2015-07-12 MED ORDER — MIDAZOLAM HCL 5 MG/5ML IJ SOLN
INTRAMUSCULAR | Status: DC | PRN
  Administered 2015-07-12: 2 mg via INTRAVENOUS

## 2015-07-12 MED ORDER — PHENYLEPHRINE HCL 10 MG/ML IJ SOLN
INTRAMUSCULAR | Status: DC | PRN
  Administered 2015-07-12 (×5): 80 ug via INTRAVENOUS
  Administered 2015-07-12: 40 ug via INTRAVENOUS

## 2015-07-12 MED ORDER — EPHEDRINE SULFATE 50 MG/ML IJ SOLN
INTRAMUSCULAR | Status: DC | PRN
  Administered 2015-07-12: 10 mg via INTRAVENOUS

## 2015-07-12 MED ORDER — FENTANYL CITRATE (PF) 100 MCG/2ML IJ SOLN: 50.0000 ug | INTRAMUSCULAR | Status: DC | PRN

## 2015-07-12 MED ORDER — HYDROMORPHONE HCL 1 MG/ML IJ SOLN
0.2500 mg | INTRAMUSCULAR | Status: DC | PRN
  Administered 2015-07-12 (×3): 0.5 mg via INTRAVENOUS

## 2015-07-12 MED ORDER — FENTANYL CITRATE (PF) 100 MCG/2ML IJ SOLN
INTRAMUSCULAR | Status: DC | PRN
  Administered 2015-07-12: 50 ug via INTRAVENOUS
  Administered 2015-07-12: 25 ug via INTRAVENOUS
  Administered 2015-07-12: 50 ug via INTRAVENOUS
  Administered 2015-07-12: 25 ug via INTRAVENOUS

## 2015-07-12 MED ORDER — SCOPOLAMINE 1 MG/3DAYS TD PT72: 1.0000 | MEDICATED_PATCH | Freq: Once | TRANSDERMAL | Status: DC | PRN

## 2015-07-12 MED ORDER — GLYCOPYRROLATE 0.2 MG/ML IJ SOLN: 0.2000 mg | Freq: Once | INTRAMUSCULAR | Status: DC | PRN

## 2015-07-12 MED ORDER — CIPROFLOXACIN IN D5W 400 MG/200ML IV SOLN
400.0000 mg | INTRAVENOUS | Status: AC
  Administered 2015-07-12 (×2): 400 mg via INTRAVENOUS

## 2015-07-12 MED ORDER — DEXAMETHASONE SODIUM PHOSPHATE 10 MG/ML IJ SOLN
INTRAMUSCULAR | Status: AC
  Filled 2015-07-12: qty 1

## 2015-07-12 MED ORDER — PROPOFOL 10 MG/ML IV BOLUS
INTRAVENOUS | Status: AC
  Filled 2015-07-12: qty 40

## 2015-07-12 MED ORDER — FENTANYL CITRATE (PF) 100 MCG/2ML IJ SOLN
INTRAMUSCULAR | Status: AC
  Filled 2015-07-12: qty 2

## 2015-07-12 MED ORDER — LACTATED RINGERS IV SOLN
INTRAVENOUS | Status: DC
  Administered 2015-07-12 (×3): via INTRAVENOUS

## 2015-07-12 MED ORDER — EPHEDRINE 5 MG/ML INJ
INTRAVENOUS | Status: AC
  Filled 2015-07-12: qty 10

## 2015-07-12 MED ORDER — PHENYLEPHRINE 40 MCG/ML (10ML) SYRINGE FOR IV PUSH (FOR BLOOD PRESSURE SUPPORT)
PREFILLED_SYRINGE | INTRAVENOUS | Status: AC
  Filled 2015-07-12: qty 10

## 2015-07-12 MED ORDER — MIDAZOLAM HCL 2 MG/2ML IJ SOLN
INTRAMUSCULAR | Status: AC
  Filled 2015-07-12: qty 2

## 2015-07-12 MED ORDER — DEXAMETHASONE SODIUM PHOSPHATE 4 MG/ML IJ SOLN
INTRAMUSCULAR | Status: DC | PRN
  Administered 2015-07-12: 10 mg via INTRAVENOUS

## 2015-07-12 MED ORDER — MIDAZOLAM HCL 2 MG/2ML IJ SOLN
1.0000 mg | INTRAMUSCULAR | Status: DC | PRN
Start: 2015-07-12 — End: 2015-07-12

## 2015-07-12 MED ORDER — LIDOCAINE 2% (20 MG/ML) 5 ML SYRINGE
INTRAMUSCULAR | Status: AC
  Filled 2015-07-12: qty 5

## 2015-07-12 SURGICAL SUPPLY — 42 items
BINDER BREAST MEDIUM (GAUZE/BANDAGES/DRESSINGS) ×3 IMPLANT
BLADE SURG 10 STRL SS (BLADE) ×7 IMPLANT
CANISTER SUCT 1200ML W/VALVE (MISCELLANEOUS) ×4 IMPLANT
CHLORAPREP W/TINT 26ML (MISCELLANEOUS) ×4 IMPLANT
COVER MAYO STAND STRL (DRAPES) ×4 IMPLANT
DRAIN CHANNEL 15F RND FF W/TCR (WOUND CARE) ×6 IMPLANT
DRAPE LAPAROSCOPIC ABDOMINAL (DRAPES) ×3 IMPLANT
DRSG PAD ABDOMINAL 8X10 ST (GAUZE/BANDAGES/DRESSINGS) ×7 IMPLANT
ELECT COATED BLADE 2.86 ST (ELECTRODE) ×4 IMPLANT
ELECT REM PT RETURN 9FT ADLT (ELECTROSURGICAL) ×4
ELECTRODE REM PT RTRN 9FT ADLT (ELECTROSURGICAL) ×2 IMPLANT
EVACUATOR SILICONE 100CC (DRAIN) ×6 IMPLANT
GLOVE BIO SURGEON STRL SZ 6 (GLOVE) ×4 IMPLANT
GLOVE BIOGEL PI IND STRL 7.0 (GLOVE) ×2 IMPLANT
GLOVE BIOGEL PI INDICATOR 7.0 (GLOVE) ×4
GLOVE ECLIPSE 6.5 STRL STRAW (GLOVE) ×3 IMPLANT
GOWN STRL REUS W/ TWL LRG LVL3 (GOWN DISPOSABLE) ×4 IMPLANT
GOWN STRL REUS W/TWL LRG LVL3 (GOWN DISPOSABLE) ×8
LIQUID BAND (GAUZE/BANDAGES/DRESSINGS) ×6 IMPLANT
MARKER SKIN DUAL TIP RULER LAB (MISCELLANEOUS) ×3 IMPLANT
NS IRRIG 1000ML POUR BTL (IV SOLUTION) ×3 IMPLANT
PACK BASIN DAY SURGERY FS (CUSTOM PROCEDURE TRAY) ×4 IMPLANT
PENCIL BUTTON HOLSTER BLD 10FT (ELECTRODE) ×4 IMPLANT
PIN SAFETY STERILE (MISCELLANEOUS) ×3 IMPLANT
SLEEVE SCD COMPRESS KNEE MED (MISCELLANEOUS) ×4 IMPLANT
SPONGE LAP 18X18 X RAY DECT (DISPOSABLE) ×8 IMPLANT
STAPLER VISISTAT 35W (STAPLE) ×4 IMPLANT
SUT ETHILON 2 0 FS 18 (SUTURE) ×6 IMPLANT
SUT ETHILON 4 0 PS 2 18 (SUTURE) ×6 IMPLANT
SUT MNCRL AB 4-0 PS2 18 (SUTURE) ×6 IMPLANT
SUT PDS AB 2-0 CT2 27 (SUTURE) ×6 IMPLANT
SUT VIC AB 3-0 PS1 18 (SUTURE) ×8
SUT VIC AB 3-0 PS1 18XBRD (SUTURE) ×2 IMPLANT
SUT VIC AB 3-0 SH 27 (SUTURE) ×8
SUT VIC AB 3-0 SH 27X BRD (SUTURE) ×2 IMPLANT
SUT VICRYL 4-0 PS2 18IN ABS (SUTURE) ×10 IMPLANT
SYR BULB IRRIGATION 50ML (SYRINGE) ×4 IMPLANT
TOWEL OR 17X24 6PK STRL BLUE (TOWEL DISPOSABLE) ×8 IMPLANT
TUBE CONNECTING 20'X1/4 (TUBING) ×1
TUBE CONNECTING 20X1/4 (TUBING) ×3 IMPLANT
UNDERPAD 30X30 (UNDERPADS AND DIAPERS) ×8 IMPLANT
YANKAUER SUCT BULB TIP NO VENT (SUCTIONS) ×4 IMPLANT

## 2015-07-12 NOTE — Op Note (Signed)
Operative Note   DATE OF OPERATION: 5.9.17  LOCATION: [facility]-outpatient  SURGICAL DIVISION: Plastic Surgery  PREOPERATIVE DIAGNOSES:  1. Left breast cancer 2. Acquired absence bilateral breasts  POSTOPERATIVE DIAGNOSES:  same  PROCEDURE:  1. Removal bilateral tissue expanders chest 2. Complex closure chest 25 cm.  SURGEON: Irene Limbo MD MBA  ASSISTANT: none  ANESTHESIA:  General.   EBL: 25 ml  COMPLICATIONS: None immediate.   INDICATIONS FOR PROCEDURE:  The patient, Anne [name], is a 70 y.o. female born on 09/24/1945, is here for removal tissue expanders. She elected for bilateral mastectomies following diagnosis left breast cancer. She underwent immediate reconstruction with tissue expanders. Course complicated by mastectomy flap necrosis and she underwent debridement and exhage tissue expanders. She has healed well from this but has now decided not pursue any reconstruction and desires removal expanders.    FINDINGS: Well healed incisions. Redundant mastectomy flap skin excised.   DESCRIPTION OF PROCEDURE:  The patient's operative site was marked with the patient in the preoperative area. The patient was taken to the operating room. SCDs were placed and IV antibiotics were given. The patient's operative site was prepped and draped in a sterile fashion. A time out was performed and all information was confirmed to be correct. I began on left and incision made in prior mid chest transverse scar. Incision carried to expander capsule and expander removed. Capsulotomies performed laterally to allow redraping of mastectomy flap for flat contour. Limited dissection of skin sn subcutaneous tissue off anterior surface pectoralis muscle completed medially. Redundant mastectomy flap tailor tacked for excision and incision carried further posterior onto lateral chest wall to address soft tissue roll in this area. Tissue marked was excised. Pectoralis muscle sewn to chest wall  with interrupted 2-0 PDS figure of eight sutures. 15 Fr drain placed in cavity and secured with 2-0 nylon. Closure completed with 3-0 and 4-0 vicryl in dermis and skin closure with 4-0 monocryl subcuticular. I then addressed to right breast where similar incision made. Expander removed. Capsulotomies performed laterally again to redrape mastectomy flap. Limited elevation soft tissue off anterior surface pectoralis completed and muscle sutured to chest wall with PDS. Redundant mastectomy flap excised and incision also carried onto lateral chest to aid with flat contour and soft tissue rolls. Closure completed in similar fashion after drain placement. Total length closure over bilateral chest 25 cm. Tissue adhesive applied followed by dry dressing and breast binder.   The patient was allowed to wake from anesthesia, extubated and taken to the recovery room in satisfactory condition.   SPECIMENS:right and left mastectomy flaps  DRAINS: 24 Fr JP in right and left chest  Irene Limbo, MD [facility] Plastic & Reconstructive Surgery [phone]

## 2015-07-12 NOTE — Anesthesia Postprocedure Evaluation (Signed)
Anesthesia Post Note  Patient: Anne [name]  Procedure(s) Performed: Procedure(s) (LRB): REMOVAL OF BILATERAL TISSUE EXPANDERS (Bilateral)  Patient location during evaluation: PACU Anesthesia Type: General Level of consciousness: awake and alert Pain management: pain level controlled Vital Signs Assessment: post-procedure vital signs reviewed and stable Respiratory status: spontaneous breathing, nonlabored ventilation and respiratory function stable Cardiovascular status: blood pressure returned to baseline and stable Postop Assessment: no signs of nausea or vomiting Anesthetic complications: no    Last Vitals:  Filed Vitals:   07/12/15 1015 07/12/15 1036  BP: 129/68 130/55  Pulse: 93 95  Temp:  36.6 C  Resp: 22 20    Last Pain:  Filed Vitals:   07/12/15 1047  PainSc: 4                  [name],W. EDMOND

## 2015-07-12 NOTE — Anesthesia Procedure Notes (Signed)
Procedure Name: LMA Insertion Date/Time: 07/12/2015 7:25 AM Performed by: Toula Moos L Pre-anesthesia Checklist: Patient identified, Emergency Drugs available, Suction available, Patient being monitored and Timeout performed Patient Re-evaluated:Patient Re-evaluated prior to inductionOxygen Delivery Method: Circle System Utilized Preoxygenation: Pre-oxygenation with 100% oxygen Intubation Type: IV induction Ventilation: Mask ventilation without difficulty LMA: LMA inserted LMA Size: 4.0 Number of attempts: 1 Airway Equipment and Method: Bite block Placement Confirmation: positive ETCO2 Tube secured with: Tape Dental Injury: Teeth and Oropharynx as per pre-operative assessment

## 2015-07-12 NOTE — Anesthesia Preprocedure Evaluation (Signed)
Anesthesia Evaluation  Patient identified by MRN, date of birth, ID band Patient awake    Reviewed: Allergy & Precautions, H&P , NPO status , Patient's Chart, lab work & pertinent test results  History of Anesthesia Complications (+) PONV  Airway Mallampati: II  TM Distance: >3 FB Neck ROM: Full    Dental no notable dental hx. (+) Lower Dentures, Partial Upper, Dental Advisory Given   Pulmonary asthma , COPD,  COPD inhaler, Current Smoker,    Pulmonary exam normal breath sounds clear to auscultation       Cardiovascular negative cardio ROS   Rhythm:Regular Rate:Normal     Neuro/Psych  Headaches, Depression    GI/Hepatic negative GI ROS, Neg liver ROS,   Endo/Other  negative endocrine ROS  Renal/GU negative Renal ROS  negative genitourinary   Musculoskeletal   Abdominal   Peds  Hematology negative hematology ROS (+)   Anesthesia Other Findings   Reproductive/Obstetrics negative OB ROS                             Anesthesia Physical Anesthesia Plan  ASA: II  Anesthesia Plan: General   Post-op Pain Management:    Induction: Intravenous  Airway Management Planned: LMA  Additional Equipment:   Intra-op Plan:   Post-operative Plan: Extubation in OR  Informed Consent: I have reviewed the patients History and Physical, chart, labs and discussed the procedure including the risks, benefits and alternatives for the proposed anesthesia with the patient or authorized representative who has indicated his/her understanding and acceptance.   Dental advisory given  Plan Discussed with: CRNA  Anesthesia Plan Comments:         Anesthesia Quick Evaluation

## 2015-07-12 NOTE — Interval H&P Note (Signed)
Removal bilateral tissue expanders

## 2015-07-12 NOTE — Discharge Instructions (Signed)
°Post Anesthesia Home Care Instructions ° °Activity: °Get plenty of rest for the remainder of the day. A responsible adult should stay with you for 24 hours following the procedure.  °For the next 24 hours, DO NOT: °-Drive a car °-Operate machinery °-Drink alcoholic beverages °-Take any medication unless instructed by your physician °-Make any legal decisions or sign important papers. ° °Meals: °Start with liquid foods such as gelatin or soup. Progress to regular foods as tolerated. Avoid greasy, spicy, heavy foods. If nausea and/or vomiting occur, drink only clear liquids until the nausea and/or vomiting subsides. Call your physician if vomiting continues. ° °Special Instructions/Symptoms: °Your throat may feel dry or sore from the anesthesia or the breathing tube placed in your throat during surgery. If this causes discomfort, gargle with warm salt water. The discomfort should disappear within 24 hours. ° °If you had a scopolamine patch placed behind your ear for the management of post- operative nausea and/or vomiting: ° °1. The medication in the patch is effective for 72 hours, after which it should be removed.  Wrap patch in a tissue and discard in the trash. Wash hands thoroughly with soap and water. °2. You may remove the patch earlier than 72 hours if you experience unpleasant side effects which may include dry mouth, dizziness or visual disturbances. °3. Avoid touching the patch. Wash your hands with soap and water after contact with the patch. °  °About my Jackson-Pratt Bulb Drain ° °What is a Jackson-Pratt bulb? °A Jackson-Pratt is a soft, round device used to collect drainage. It is connected to a long, thin drainage catheter, which is held in place by one or two small stiches near your surgical incision site. When the bulb is squeezed, it forms a vacuum, forcing the drainage to empty into the bulb. ° °Emptying the Jackson-Pratt bulb- °To empty the bulb: °1. Release the plug on the top of the bulb. °2.  Pour the bulb's contents into a measuring container which your nurse will provide. °3. Record the time emptied and amount of drainage. Empty the drain(s) as often as your     doctor or nurse recommends. ° °Date                  Time                    Amount (Drain 1)                 Amount (Drain 2) ° °_____________________________________________________________________ ° °_____________________________________________________________________ ° °_____________________________________________________________________ ° °_____________________________________________________________________ ° °_____________________________________________________________________ ° °_____________________________________________________________________ ° °_____________________________________________________________________ ° °_____________________________________________________________________ ° °Squeezing the Jackson-Pratt Bulb- °To squeeze the bulb: °1. Make sure the plug at the top of the bulb is open. °2. Squeeze the bulb tightly in your fist. You will hear air squeezing from the bulb. °3. Replace the plug while the bulb is squeezed. °4. Use a safety pin to attach the bulb to your clothing. This will keep the catheter from     pulling at the bulb insertion site. ° °When to call your doctor- °Call your doctor if: °· Drain site becomes red, swollen or hot. °· You have a fever greater than 101 degrees F. °· There is oozing at the drain site. °· Drain falls out (apply a guaze bandage over the drain hole and secure it with tape). °· Drainage increases daily not related to activity patterns. (You will usually have more drainage when you are active than when you are resting.) °· Drainage has a bad   odor. ° ° °

## 2015-07-12 NOTE — Transfer of Care (Signed)
Immediate Anesthesia Transfer of Care Note  Patient: Anne [name]  Procedure(s) Performed: Procedure(s): REMOVAL OF BILATERAL TISSUE EXPANDERS (Bilateral)  Patient Location: PACU  Anesthesia Type:General  Level of Consciousness: sedated  Airway & Oxygen Therapy: Patient Spontanous Breathing and Patient connected to face mask oxygen  Post-op Assessment: Report given to RN and Post -op Vital signs reviewed and stable  Post vital signs: Reviewed and stable  Last Vitals:  Filed Vitals:   07/12/15 0638  BP: 109/54  Pulse: 76  Temp: 36.5 C  Resp: 18    Last Pain:  Filed Vitals:   07/12/15 0640  PainSc: 4       Patients Stated Pain Goal: 1 ([id number])  Complications: No apparent anesthesia complications

## 2015-07-13 ENCOUNTER — Encounter (HOSPITAL_BASED_OUTPATIENT_CLINIC_OR_DEPARTMENT_OTHER): Payer: Self-pay | Admitting: Plastic Surgery

## 2015-07-21 DIAGNOSIS — C50912 Malignant neoplasm of unspecified site of left female breast: Secondary | ICD-10-CM | POA: Diagnosis not present

## 2015-07-21 DIAGNOSIS — Z79810 Long term (current) use of selective estrogen receptor modulators (SERMs): Secondary | ICD-10-CM | POA: Diagnosis not present

## 2015-07-25 DIAGNOSIS — R600 Localized edema: Secondary | ICD-10-CM | POA: Diagnosis not present

## 2015-07-25 DIAGNOSIS — R5383 Other fatigue: Secondary | ICD-10-CM | POA: Diagnosis not present

## 2015-08-16 DIAGNOSIS — Z681 Body mass index (BMI) 19 or less, adult: Secondary | ICD-10-CM | POA: Diagnosis not present

## 2015-08-16 DIAGNOSIS — F5109 Other insomnia not due to a substance or known physiological condition: Secondary | ICD-10-CM | POA: Diagnosis not present

## 2015-08-16 DIAGNOSIS — R600 Localized edema: Secondary | ICD-10-CM | POA: Diagnosis not present

## 2015-08-16 DIAGNOSIS — F331 Major depressive disorder, recurrent, moderate: Secondary | ICD-10-CM | POA: Diagnosis not present

## 2015-08-16 DIAGNOSIS — J410 Simple chronic bronchitis: Secondary | ICD-10-CM | POA: Diagnosis not present

## 2015-08-16 DIAGNOSIS — C50819 Malignant neoplasm of overlapping sites of unspecified female breast: Secondary | ICD-10-CM | POA: Diagnosis not present

## 2015-08-16 DIAGNOSIS — E782 Mixed hyperlipidemia: Secondary | ICD-10-CM | POA: Diagnosis not present

## 2015-08-16 DIAGNOSIS — R7301 Impaired fasting glucose: Secondary | ICD-10-CM | POA: Diagnosis not present

## 2015-08-16 DIAGNOSIS — R627 Adult failure to thrive: Secondary | ICD-10-CM | POA: Diagnosis not present

## 2015-09-21 DIAGNOSIS — S02651A Fracture of angle of right mandible, initial encounter for closed fracture: Secondary | ICD-10-CM | POA: Diagnosis not present

## 2015-10-21 DIAGNOSIS — Z853 Personal history of malignant neoplasm of breast: Secondary | ICD-10-CM | POA: Diagnosis not present

## 2015-10-21 DIAGNOSIS — C50212 Malignant neoplasm of upper-inner quadrant of left female breast: Secondary | ICD-10-CM | POA: Diagnosis not present

## 2015-11-03 DIAGNOSIS — Z9013 Acquired absence of bilateral breasts and nipples: Secondary | ICD-10-CM | POA: Diagnosis not present

## 2015-11-03 DIAGNOSIS — Z853 Personal history of malignant neoplasm of breast: Secondary | ICD-10-CM | POA: Diagnosis not present

## 2015-11-03 DIAGNOSIS — F17200 Nicotine dependence, unspecified, uncomplicated: Secondary | ICD-10-CM | POA: Diagnosis not present

## 2015-11-17 DIAGNOSIS — F5109 Other insomnia not due to a substance or known physiological condition: Secondary | ICD-10-CM | POA: Diagnosis not present

## 2015-11-17 DIAGNOSIS — Z23 Encounter for immunization: Secondary | ICD-10-CM | POA: Diagnosis not present

## 2015-11-17 DIAGNOSIS — J410 Simple chronic bronchitis: Secondary | ICD-10-CM | POA: Diagnosis not present

## 2015-11-17 DIAGNOSIS — R7301 Impaired fasting glucose: Secondary | ICD-10-CM | POA: Diagnosis not present

## 2015-11-17 DIAGNOSIS — F331 Major depressive disorder, recurrent, moderate: Secondary | ICD-10-CM | POA: Diagnosis not present

## 2015-11-17 DIAGNOSIS — R600 Localized edema: Secondary | ICD-10-CM | POA: Diagnosis not present

## 2015-11-17 DIAGNOSIS — R627 Adult failure to thrive: Secondary | ICD-10-CM | POA: Diagnosis not present

## 2015-11-17 DIAGNOSIS — E559 Vitamin D deficiency, unspecified: Secondary | ICD-10-CM | POA: Diagnosis not present

## 2015-11-17 DIAGNOSIS — C50819 Malignant neoplasm of overlapping sites of unspecified female breast: Secondary | ICD-10-CM | POA: Diagnosis not present

## 2015-11-17 DIAGNOSIS — E782 Mixed hyperlipidemia: Secondary | ICD-10-CM | POA: Diagnosis not present

## 2015-11-17 DIAGNOSIS — G43109 Migraine with aura, not intractable, without status migrainosus: Secondary | ICD-10-CM | POA: Diagnosis not present

## 2015-12-19 DIAGNOSIS — R55 Syncope and collapse: Secondary | ICD-10-CM | POA: Diagnosis not present

## 2015-12-19 DIAGNOSIS — R5383 Other fatigue: Secondary | ICD-10-CM | POA: Diagnosis not present

## 2015-12-27 DIAGNOSIS — R55 Syncope and collapse: Secondary | ICD-10-CM | POA: Diagnosis not present

## 2016-01-09 DIAGNOSIS — R55 Syncope and collapse: Secondary | ICD-10-CM | POA: Diagnosis not present

## 2016-01-11 DIAGNOSIS — G43011 Migraine without aura, intractable, with status migrainosus: Secondary | ICD-10-CM | POA: Diagnosis not present

## 2016-01-11 DIAGNOSIS — R55 Syncope and collapse: Secondary | ICD-10-CM | POA: Diagnosis not present

## 2016-01-18 DIAGNOSIS — L910 Hypertrophic scar: Secondary | ICD-10-CM | POA: Diagnosis not present

## 2016-01-18 DIAGNOSIS — Z853 Personal history of malignant neoplasm of breast: Secondary | ICD-10-CM | POA: Diagnosis not present

## 2016-01-18 DIAGNOSIS — L905 Scar conditions and fibrosis of skin: Secondary | ICD-10-CM | POA: Diagnosis not present

## 2016-01-18 DIAGNOSIS — F17210 Nicotine dependence, cigarettes, uncomplicated: Secondary | ICD-10-CM | POA: Diagnosis not present

## 2016-01-18 DIAGNOSIS — Z9013 Acquired absence of bilateral breasts and nipples: Secondary | ICD-10-CM | POA: Diagnosis not present

## 2016-01-20 DIAGNOSIS — Z853 Personal history of malignant neoplasm of breast: Secondary | ICD-10-CM | POA: Diagnosis not present

## 2016-01-20 DIAGNOSIS — C50212 Malignant neoplasm of upper-inner quadrant of left female breast: Secondary | ICD-10-CM | POA: Diagnosis not present

## 2016-03-20 DIAGNOSIS — F5101 Primary insomnia: Secondary | ICD-10-CM | POA: Diagnosis not present

## 2016-03-20 DIAGNOSIS — J018 Other acute sinusitis: Secondary | ICD-10-CM | POA: Diagnosis not present

## 2016-03-23 DIAGNOSIS — N3001 Acute cystitis with hematuria: Secondary | ICD-10-CM | POA: Diagnosis not present

## 2016-03-28 DIAGNOSIS — N200 Calculus of kidney: Secondary | ICD-10-CM | POA: Diagnosis not present

## 2016-03-28 DIAGNOSIS — R109 Unspecified abdominal pain: Secondary | ICD-10-CM | POA: Diagnosis not present

## 2016-03-29 DIAGNOSIS — R310 Gross hematuria: Secondary | ICD-10-CM | POA: Diagnosis not present

## 2016-04-20 DIAGNOSIS — Z853 Personal history of malignant neoplasm of breast: Secondary | ICD-10-CM | POA: Diagnosis not present

## 2016-04-26 DIAGNOSIS — F5109 Other insomnia not due to a substance or known physiological condition: Secondary | ICD-10-CM | POA: Diagnosis not present

## 2016-04-26 DIAGNOSIS — R55 Syncope and collapse: Secondary | ICD-10-CM | POA: Diagnosis not present

## 2016-04-26 DIAGNOSIS — E559 Vitamin D deficiency, unspecified: Secondary | ICD-10-CM | POA: Diagnosis not present

## 2016-04-26 DIAGNOSIS — E782 Mixed hyperlipidemia: Secondary | ICD-10-CM | POA: Diagnosis not present

## 2016-04-26 DIAGNOSIS — R627 Adult failure to thrive: Secondary | ICD-10-CM | POA: Diagnosis not present

## 2016-04-26 DIAGNOSIS — R600 Localized edema: Secondary | ICD-10-CM | POA: Diagnosis not present

## 2016-04-26 DIAGNOSIS — F320 Major depressive disorder, single episode, mild: Secondary | ICD-10-CM | POA: Diagnosis not present

## 2016-04-26 DIAGNOSIS — C50812 Malignant neoplasm of overlapping sites of left female breast: Secondary | ICD-10-CM | POA: Diagnosis not present

## 2016-04-26 DIAGNOSIS — G43109 Migraine with aura, not intractable, without status migrainosus: Secondary | ICD-10-CM | POA: Diagnosis not present

## 2016-04-26 DIAGNOSIS — R7301 Impaired fasting glucose: Secondary | ICD-10-CM | POA: Diagnosis not present

## 2016-04-26 DIAGNOSIS — J410 Simple chronic bronchitis: Secondary | ICD-10-CM | POA: Diagnosis not present

## 2016-05-04 ENCOUNTER — Encounter: Payer: Self-pay | Admitting: Internal Medicine

## 2016-05-04 ENCOUNTER — Ambulatory Visit (INDEPENDENT_AMBULATORY_CARE_PROVIDER_SITE_OTHER): Payer: Medicare Other | Admitting: Internal Medicine

## 2016-05-04 ENCOUNTER — Other Ambulatory Visit: Payer: Self-pay | Admitting: Cardiology

## 2016-05-04 VITALS — BP 100/50 | HR 66 | Ht 61.0 in | Wt 119.8 lb

## 2016-05-04 DIAGNOSIS — R55 Syncope and collapse: Secondary | ICD-10-CM | POA: Diagnosis not present

## 2016-05-04 DIAGNOSIS — I779 Disorder of arteries and arterioles, unspecified: Secondary | ICD-10-CM

## 2016-05-04 DIAGNOSIS — I739 Peripheral vascular disease, unspecified: Secondary | ICD-10-CM

## 2016-05-04 HISTORY — DX: Disorder of arteries and arterioles, unspecified: I77.9

## 2016-05-04 NOTE — Patient Instructions (Addendum)
Dr. Debara Pickett has ordered a LOOP RECORDER @ [facility] Please schedule with Dr. Reggy Eye  Your physician has requested that you have a carotid duplex. This test is an ultrasound of the carotid arteries in your neck. It looks at blood flow through these arteries that supply the brain with blood. Allow one hour for this exam. There are no restrictions or special instructions.  Your physician recommends that you schedule a follow-up appointment in [place] with Dr. Debara Pickett  Dr. Debara Pickett has requested records of echo from [facility].

## 2016-05-04 NOTE — Progress Notes (Signed)
OFFICE NOTE  Chief Complaint:  "I've passed out"  Primary Care Physician: [name], MD  HPI:  Anne [name] is a 71 y.o. female who presents for evaluation of syncope. Unfortunately she was diagnosed with breast cancer around March of last year and underwent bilateral mastectomy. She said she did not require radiation or chemotherapy. About 3 months later she had her first syncopal episode. 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 Subsequently she's had for other events however they were not associated with any prodrome. 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 She's had some significant ecchymosis and has no recollection of these events. One feature, she noted was that she was banging her knee up against the cabinet or counter for a short period of time prior to the event. This was involuntary movement. She does have a history of migraine headaches and is on medications for that including Topamax and Imitrex as needed. Medications for anxiety and insomnia. She denies any chest pain or worsening shortness of breath. Her primary care provider ordered a monitor which showed no evidence of significant pauses, A. fib, nonsustained VT or heart block. She apparently recently had an echo as well at [facility] which demonstrated no significant findings. We will request those records. EKG in the office today shows normal sinus rhythm at 66.  PMHx:  Past Medical History:  Diagnosis Date  . Asthma    daily inhalers  . Breast cancer ([place]) 05/2015   left  . COPD (chronic obstructive pulmonary disease) ([place])    denies SOB with ADLs; no O2  . Dental crowns present   . Depression   . History of concussion 01/01/2014  . History of kidney stones   . History of TIA (transient ischemic attack) 10/2014  . Hyperlipidemia   . Insomnia   . Migraine   .  Nasal congestion 05/26/2015   will finish z-pak 05/27/2015  . Nonproductive cough 05/26/2015  . PONV (postoperative nausea and vomiting)    "long time ago"  none recently  . RLS (restless legs syndrome)   . Vitamin D deficiency   . Wears dentures    lower  . Wears partial dentures    upper    Past Surgical History:  Procedure Laterality Date  . ABDOMINAL HYSTERECTOMY  2000   complete  . APPENDECTOMY  2000  . BREAST BIOPSY Left   . BREAST RECONSTRUCTION WITH PLACEMENT OF TISSUE EXPANDER AND FLEX HD (ACELLULAR HYDRATED DERMIS) Bilateral 06/02/2015   Procedure: BILATERAL BREAST RECONSTRUCTION WITH PLACEMENT OF TISSUE EXPANDER AND  ACELLULAR HYDRATED DERMIS;  Surgeon: Irene Limbo, MD;  Location: [place];  Service: Plastics;  Laterality: Bilateral;  . CARPAL TUNNEL RELEASE Bilateral   . CATARACT EXTRACTION Bilateral 09/2013  . KNEE ARTHROSCOPY Left   . MASTECTOMY W/ SENTINEL NODE BIOPSY Bilateral 06/02/2015   Procedure: BILATERAL MASTECTOMY WITH LEFT SENTINEL LYMPH NODE BIOPSY;  Surgeon: Stark Klein, MD;  Location: [place];  Service: General;  Laterality: Bilateral;  . NASAL SEPTUM SURGERY     x 3  . PLANTAR FASCIA SURGERY Left   . REMOVAL OF BILATERAL TISSUE EXPANDERS WITH PLACEMENT OF BILATERAL BREAST IMPLANTS Bilateral 06/17/2015   Procedure: DEBRIDEMENT OF BILATERAL MASTECTOMY FLAP WITH BILATERAL TISSUE EXPANDER EXCHANGE ;  Surgeon: Irene Limbo, MD;  Location: [place];  Service: Clinical cytogeneticist;  Laterality: Bilateral;  . REMOVAL OF TISSUE EXPANDER Bilateral 07/12/2015   Procedure: REMOVAL OF BILATERAL TISSUE EXPANDERS;  Surgeon: Irene Limbo, MD;  Location: [place];  Service: Plastics;  Laterality: Bilateral;    FAMHx:  Family History  Problem Relation Age of Onset  . Cervical cancer Mother 42  . Colon cancer Maternal Grandfather     mets to stomach; dx. 67-70  . Heart disease Father   . Prostate cancer Father 64  .  Cervical cancer Maternal Grandmother     dx. 43s; treated with radium implant  . Lung cancer Sister 57    maternal half-sister dx. lung cancer, stage III  . Breast cancer Maternal Aunt   . Cervical cancer Sister 36    paternal half-sister; s/p TAH  . Spina bifida Grandchild   . Brain cancer Grandchild     grandson dx. at 20 mos, treated at [facility]  . Breast cancer Other 102    maternal half-sister's daughter  . Cancer Maternal Aunt     d. 72; unspecified type - "began at back area and moved to vital organs"  . Cancer Maternal Uncle      late 69s; unspecified type; "started at back and moved to vital organs"    SOCHx:   reports that she has been smoking Cigarettes.  She has been smoking about 0.25 packs per day. She has never used smokeless tobacco. She reports that she drinks alcohol. She reports that she does not use drugs.  ALLERGIES:  Allergies  Allergen Reactions  . Rocephin [Ceftriaxone Sodium In Dextrose] Shortness Of Breath and Rash  . Clindamycin/Lincomycin Nausea And Vomiting  . Gabapentin Other (See Comments)    Personality Changes  . Lyrica [Pregabalin] Other (See Comments)    PERSONALITY CHANGE    ROS: Pertinent items noted in HPI and remainder of comprehensive ROS otherwise negative.  HOME MEDS: Current Outpatient Prescriptions on File Prior to Visit  Medication Sig Dispense Refill  . Aclidinium Bromide (TUDORZA PRESSAIR) 400 MCG/ACT AEPB Inhale 1 puff into the lungs daily.     Marland Kitchen atorvastatin (LIPITOR) 10 MG tablet Take 10 mg by mouth daily.    Marland Kitchen BREO ELLIPTA 100-25 MCG/INH AEPB Inhale 1 puff into the lungs daily.     . Cholecalciferol (VITAMIN D3) 50000 UNITS CAPS Take 50,000 Units by mouth every Saturday.     . Eszopiclone (ESZOPICLONE) 3 MG TABS Take 3 mg by mouth at bedtime. Take immediately before bedtime    . hydrOXYzine (ATARAX/VISTARIL) 50 MG tablet Take 50 mg by mouth at bedtime.     . mirtazapine (REMERON) 30 MG tablet Take 30 mg by mouth at bedtime.      Marland Kitchen PARoxetine (PAXIL) 40 MG tablet Take 40 mg by mouth at bedtime.     Vladimir Faster Glycol-Propyl Glycol (SYSTANE ULTRA) 0.4-0.3 % SOLN Place 1 drop into both eyes daily as needed (for dry eyes).     . pramipexole (MIRAPEX) 1 MG tablet Take 1 mg by mouth at bedtime.    . promethazine (PHENERGAN) 25 MG tablet Take 25 mg by mouth every 6 (six) hours as needed for nausea or vomiting.    . SUMAtriptan (IMITREX) 100 MG tablet Take 100 mg by mouth daily as needed for migraine (100 mg and then may repeat in 2 hours if headache persists).     . tamoxifen (NOLVADEX) 20 MG tablet Take 20 mg by mouth daily.    Marland Kitchen topiramate (TOPAMAX) 100 MG tablet  Take 50 mg by mouth at bedtime.     . traZODone (DESYREL) 100 MG tablet Take 50 mg by mouth at bedtime.     . VENTOLIN HFA 108 (90 BASE) MCG/ACT inhaler Inhale 1 puff into the lungs every 6 (six) hours as needed for wheezing or shortness of breath.      No current facility-administered medications on file prior to visit.     LABS/IMAGING: No results found for this or any previous visit (from the past 48 hour(s)). No results found.  WEIGHTS: Wt Readings from Last 3 Encounters:  05/04/16 119 lb 12.8 oz (54.3 kg)  07/12/15 106 lb (48.1 kg)  06/17/15 107 lb (48.5 kg)    VITALS: BP (!) 100/50   Pulse 66   Ht 5\' 1"  (1.549 m)   Wt 119 lb 12.8 oz (54.3 kg)   BMI 22.64 kg/m   EXAM: General appearance: alert and no distress Neck: no carotid bruit and no JVD Lungs: clear to auscultation bilaterally Heart: regular rate and rhythm Abdomen: soft, non-tender; bowel sounds normal; no masses,  no organomegaly Extremities: extremities normal, atraumatic, no cyanosis or edema Pulses: 2+ and symmetric Skin: Skin color, texture, turgor normal. No rashes or lesions Neurologic: Grossly normal PSych: Pleasant  EKG: Normal sinus rhythm at 66  ASSESSMENT: 1. Drop syncope 2. ?Complex-partial seizure  PLAN: 1.   Mrs. Shaker reports several episodes of drop  syncope without any clear prodrome which may been preceded by rhythmic right knee motion suggestive of possible complex partial seizure or perhaps some myoclonus. Monitoring has revealed no source of her events but she had no syncope during the monitoring. She has had 5 events over 6-8 months. The frequency of events is very rare. We talked about possible diagnostic tests and I would consider carotid Dopplers although this would be an unlikely cause of stroke she does have a history of carotid artery disease and if she had some concomitant vertebral disease there could be a steal phenomenon that could cause her to pass out. As mentioned I will obtain her echo and review it. Finally, cryptogenic arrhythmia should be considered. The best way to pick up on relatively infrequent episodes would be an implanted loop recorder. I did discuss that device today as well as the risks, benefits and alternatives to the procedure and she seems interested in proceeding with this. The unusual knee movement that occurs prior to these events could suggest a neurologic etiology such as generalized seizures starting as complex partial seizure. A neurology referral may be helpful.  Thanks for this kind referral.  Pixie Casino, MD, [facility] Attending Cardiologist [place] 05/04/2016, 5:37 PM

## 2016-05-08 ENCOUNTER — Telehealth: Payer: Self-pay | Admitting: Internal Medicine

## 2016-05-08 NOTE — Telephone Encounter (Signed)
Faxed Release signed by patient to [facility] to obtain records per Dr Debara Pickett.  Records given to Dr Debara Pickett for review. lp

## 2016-05-09 ENCOUNTER — Telehealth: Payer: Self-pay | Admitting: Internal Medicine

## 2016-05-09 ENCOUNTER — Other Ambulatory Visit: Payer: Self-pay | Admitting: *Deleted

## 2016-05-09 NOTE — Telephone Encounter (Signed)
Returned call to patient-patient wanted to ask a few questions about upcoming procedure.  Pt wondering why this was an overnight stay and if all of her medications that she takes at night could be causing her syncopal episodes.  Advised that they will keep her overnight to monitor her for any complications, this is just precautionary.  Also advised that it seemed that Dr. Debara Pickett did not contribute these episodes to medications and is concerned for other causes.  Patient verbalized understanding and thanked me for my time and returning her call.    No further questions or concerns at this time.  Advised to call back if needed.

## 2016-05-09 NOTE — Telephone Encounter (Signed)
New Message    Pt has questions about the medication she is on and the procedure she is having done at [facility] for loop recorder

## 2016-05-11 ENCOUNTER — Encounter (HOSPITAL_COMMUNITY)
Admission: RE | Disposition: A | Discharge status: Home / Self Care | Payer: Self-pay | Source: Ambulatory Visit | Attending: Cardiology

## 2016-05-11 ENCOUNTER — Ambulatory Visit (HOSPITAL_COMMUNITY)
Admission: RE | Admit: 2016-05-11 | Discharge: 2016-05-11 | Disposition: A | Discharge status: Home / Self Care | Payer: Medicare Other | Source: Ambulatory Visit | Attending: Cardiology | Admitting: Cardiology

## 2016-05-11 ENCOUNTER — Encounter (HOSPITAL_COMMUNITY): Payer: Self-pay | Admitting: *Deleted

## 2016-05-11 DIAGNOSIS — R55 Syncope and collapse: Secondary | ICD-10-CM | POA: Diagnosis not present

## 2016-05-11 HISTORY — PX: LOOP RECORDER INSERTION: EP1214

## 2016-05-11 SURGERY — LOOP RECORDER INSERTION
Anesthesia: LOCAL

## 2016-05-11 MED ORDER — LIDOCAINE HCL (PF) 1 % IJ SOLN
INTRAMUSCULAR | Status: DC | PRN
  Administered 2016-05-11: 10 mL

## 2016-05-11 MED ORDER — LIDOCAINE HCL (PF) 1 % IJ SOLN
INTRAMUSCULAR | Status: AC
  Filled 2016-05-11: qty 30

## 2016-05-11 SURGICAL SUPPLY — 2 items
LOOP REVEAL LINQSYS (Prosthesis & Implant Heart) ×1 IMPLANT
PACK LOOP INSERTION (CUSTOM PROCEDURE TRAY) ×2 IMPLANT

## 2016-05-11 NOTE — Discharge Instructions (Signed)
Keep incision clean and dry for 3 days. No driving for 2 days.  You can remove outer dressing tomorrow. Leave steri-strips (little pieces of tape) on until seen in the office for wound check appointment. Call the office [phone]) for redness, drainage, swelling, or fever.

## 2016-05-11 NOTE — H&P (Signed)
Anne [name] is a 71 y.o. female who presents with syncope. She had a bilateral mastectomy and now has had episodes of syncope that have not been found on cardiac monitoring. Presenting today for LINQ implant. Risks and benefits discussed. Risks include bleeding and infection. She understands these risks and has agreed to the procedure.  Anne [name] Curt Bears, MD 05/11/2016 4:02 PM

## 2016-05-14 ENCOUNTER — Encounter (HOSPITAL_COMMUNITY): Payer: Self-pay | Admitting: Cardiology

## 2016-05-17 ENCOUNTER — Ambulatory Visit (HOSPITAL_COMMUNITY)
Admission: RE | Admit: 2016-05-17 | Discharge: 2016-05-17 | Disposition: A | Discharge status: Home / Self Care | Payer: Medicare Other | Source: Ambulatory Visit | Attending: Cardiology | Admitting: Cardiology

## 2016-05-17 DIAGNOSIS — R55 Syncope and collapse: Secondary | ICD-10-CM | POA: Diagnosis not present

## 2016-05-17 DIAGNOSIS — I739 Peripheral vascular disease, unspecified: Secondary | ICD-10-CM

## 2016-05-17 DIAGNOSIS — I6523 Occlusion and stenosis of bilateral carotid arteries: Secondary | ICD-10-CM | POA: Diagnosis not present

## 2016-05-17 DIAGNOSIS — I779 Disorder of arteries and arterioles, unspecified: Secondary | ICD-10-CM | POA: Diagnosis not present

## 2016-05-18 ENCOUNTER — Telehealth: Payer: Self-pay | Admitting: Internal Medicine

## 2016-05-18 NOTE — Telephone Encounter (Signed)
Called the patient and left a VM with my name and number to call back to schedule for 1 month followup with Dr. Debara Pickett.

## 2016-05-22 ENCOUNTER — Ambulatory Visit (INDEPENDENT_AMBULATORY_CARE_PROVIDER_SITE_OTHER): Payer: Medicare Other | Admitting: *Deleted

## 2016-05-22 DIAGNOSIS — R55 Syncope and collapse: Secondary | ICD-10-CM

## 2016-05-22 LAB — CUP PACEART INCLINIC DEVICE CHECK
Date Time Interrogation Session: 20180320130240
Implantable Pulse Generator Implant Date: 20180309

## 2016-05-22 NOTE — Progress Notes (Signed)
Wound check appointment. Steri-strips previously removed by patient. Wound without redness or edema. Incision edges approximated, wound well healed. Battery status: good. R-waves 0.32mV. No symptom, tachy, pause, brady, or AF episodes. Patient educated about symptom activator and Carelink monitor. Monthly summary reports and ROV with WC on 08/13/16.

## 2016-05-23 ENCOUNTER — Ambulatory Visit: Payer: Medicare Other

## 2016-06-11 ENCOUNTER — Ambulatory Visit (INDEPENDENT_AMBULATORY_CARE_PROVIDER_SITE_OTHER): Payer: Medicare Other | Admitting: *Deleted

## 2016-06-11 DIAGNOSIS — R55 Syncope and collapse: Secondary | ICD-10-CM

## 2016-06-12 NOTE — Progress Notes (Signed)
Carelink Summary Report / Loop Recorder 

## 2016-06-15 ENCOUNTER — Telehealth: Payer: Self-pay | Admitting: Cardiology

## 2016-06-15 NOTE — Telephone Encounter (Signed)
Pt called and stated that she passed out around 12 AM - 5 AM on Wednesday June 13, 2016. Informed pt of [place] DMV law that if a patient passes out that person should not drive for 6 months for her safety and other drivers safety. Informed her to send a remote transmission w/ her home monitor and once it is received a Device Tech RN will call her back with results. Pt stated that it will be around 2:00 PM before she is able to send a remote transmission.

## 2016-06-18 NOTE — Telephone Encounter (Signed)
Spoke with pt informed her that transmission was received and there were no episodes that correlated with that date and time of syncopal episode. Informed pt that she should f/u with her primary care provider regarding syncopal episode, that it could be blood pressure related. Also informed pt that because there was no pause of bradycardia episodes noted on her LINQ report  there are no driving restrictions.  Pt voiced understanding and appreciative of call back.

## 2016-06-19 LAB — CUP PACEART REMOTE DEVICE CHECK
Implantable Pulse Generator Implant Date: 20180309
MDC IDC SESS DTM: 20180408203528

## 2016-06-19 NOTE — Progress Notes (Signed)
Carelink summary report received. Battery status OK. Normal device function. No new tachy episodes, brady, or pause episodes. No new AF episodes. 3 symptom- 2 w/ ECGs appear SR w/ occ. PVC. See ECGs. Monthly summary reports and ROV/PRN

## 2016-07-02 ENCOUNTER — Encounter: Payer: Self-pay | Admitting: Internal Medicine

## 2016-07-02 ENCOUNTER — Telehealth: Payer: Self-pay | Admitting: Internal Medicine

## 2016-07-02 ENCOUNTER — Ambulatory Visit (INDEPENDENT_AMBULATORY_CARE_PROVIDER_SITE_OTHER): Payer: Medicare Other | Admitting: Internal Medicine

## 2016-07-02 VITALS — BP 114/56 | HR 83 | Ht 61.0 in | Wt 115.8 lb

## 2016-07-02 DIAGNOSIS — G479 Sleep disorder, unspecified: Secondary | ICD-10-CM

## 2016-07-02 DIAGNOSIS — M5489 Other dorsalgia: Secondary | ICD-10-CM | POA: Diagnosis not present

## 2016-07-02 DIAGNOSIS — Z79899 Other long term (current) drug therapy: Secondary | ICD-10-CM | POA: Insufficient documentation

## 2016-07-02 DIAGNOSIS — R55 Syncope and collapse: Secondary | ICD-10-CM | POA: Diagnosis not present

## 2016-07-02 DIAGNOSIS — T148XXA Other injury of unspecified body region, initial encounter: Secondary | ICD-10-CM | POA: Diagnosis not present

## 2016-07-02 NOTE — Patient Instructions (Signed)
You have been referred to Dr. Asencion Partridge Dohmeier @ [facility]   Your physician recommends that you schedule a follow-up appointment in: August 2018 with Dr. Debara Pickett

## 2016-07-02 NOTE — Telephone Encounter (Signed)
Left message to call back  

## 2016-07-02 NOTE — Telephone Encounter (Signed)
New Message  Pt voiced needing appt, soonest we have with MD-[facility] is 6.19 and PA-Simmons 5.14. Pt declined both and wants to speak with nurse.  Pt voiced she couldn't come at 72 but she could come in today, advised pt of 30 min window and rescheduling if her arrival is not within 850 am.  Please f/u

## 2016-07-02 NOTE — Telephone Encounter (Signed)
Patient here for office visit now. 

## 2016-07-02 NOTE — Progress Notes (Signed)
OFFICE NOTE  Chief Complaint:  Follow-up syncope, ILR  Primary Care Physician: [name], MD  HPI:  Anne [name] is a 71 y.o. female who presents for evaluation of syncope. Unfortunately she was diagnosed with breast cancer around March of last year and underwent bilateral mastectomy. She said she did not require radiation or chemotherapy. About 3 months later she had her first syncopal episode. 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 Subsequently she's had for other events however they were not associated with any prodrome. 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 She's had some significant ecchymosis and has no recollection of these events. One feature, she noted was that she was banging her knee up against the cabinet or counter for a short period of time prior to the event. This was involuntary movement. She does have a history of migraine headaches and is on medications for that including Topamax and Imitrex as needed. Medications for anxiety and insomnia. She denies any chest pain or worsening shortness of breath. Her primary care provider ordered a monitor which showed no evidence of significant pauses, A. fib, nonsustained VT or heart block. She apparently recently had an echo as well at [facility] which demonstrated no significant findings. We will request those records. EKG in the office today shows normal sinus rhythm at 66.  07/02/2016  Anne [name] returns today for follow-up of syncope and implanted loop recorder. I referred her to Dr. Curt Bears for implanted loop recorder which was placed. Subsequent to that she had an episode of dizziness and syncope. This was reported and her monitor was interrogated. This indicated no evidence of A. fib, tachycardia or bradycardia arrhythmias or significant positive straight explain her syncopal  episode. Blood pressure however was low with systolics in the 40J. She's had some intermittent low blood pressures and lability to her blood pressure over the past several months. 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 She's also had difficulty sleeping and at night and she is on a number of medications. I reviewed her med list and it indicates she is on Vicodin, hydroxyzine, mirtazapine, pramipexole, peroxetine, temazepam, topiramate and trazodone. The indication for all these medications is not totally clear however, I suspect that she may be suffering from polypharmacy which could put her at risk for neurologic events or syncope related to medications. Her description was also concerning for possible neurologic etiology. Cardiac workup has been unremarkable.  PMHx:  Past Medical History:  Diagnosis Date  . Asthma    daily inhalers  . Breast cancer ([place]) 05/2015   left  . COPD (chronic obstructive pulmonary disease) ([place])    denies SOB with ADLs; no O2  . Dental crowns present   . Depression   . History of concussion 01/01/2014  . History of kidney stones   . History of TIA (transient ischemic attack) 10/2014  . Hyperlipidemia   . Insomnia   . Migraine   . Nasal congestion 05/26/2015   will finish z-pak 05/27/2015  . Nonproductive cough 05/26/2015  . PONV (postoperative nausea and vomiting)    "long time ago"  none recently  . RLS (restless legs syndrome)   . Vitamin D deficiency   . Wears dentures    lower  . Wears partial dentures    upper  Past Surgical History:  Procedure Laterality Date  . ABDOMINAL HYSTERECTOMY  2000   complete  . APPENDECTOMY  2000  . BREAST BIOPSY Left   . BREAST RECONSTRUCTION WITH PLACEMENT OF TISSUE EXPANDER AND FLEX HD (ACELLULAR HYDRATED DERMIS) Bilateral 06/02/2015   Procedure: BILATERAL BREAST RECONSTRUCTION WITH PLACEMENT OF TISSUE EXPANDER AND  ACELLULAR HYDRATED DERMIS;  Surgeon: Irene Limbo, MD;  Location: [place];  Service: Plastics;  Laterality: Bilateral;  . CARPAL TUNNEL RELEASE Bilateral   . CATARACT EXTRACTION Bilateral 09/2013  . KNEE ARTHROSCOPY Left   . LOOP RECORDER INSERTION N/A 05/11/2016   Procedure: Loop Recorder Insertion;  Surgeon: Will Meredith Leeds, MD;  Location: [place] [facility];  Service: Cardiovascular;  Laterality: N/A;  . MASTECTOMY W/ SENTINEL NODE BIOPSY Bilateral 06/02/2015   Procedure: BILATERAL MASTECTOMY WITH LEFT SENTINEL LYMPH NODE BIOPSY;  Surgeon: Stark Klein, MD;  Location: [place];  Service: General;  Laterality: Bilateral;  . NASAL SEPTUM SURGERY     x 3  . PLANTAR FASCIA SURGERY Left   . REMOVAL OF BILATERAL TISSUE EXPANDERS WITH PLACEMENT OF BILATERAL BREAST IMPLANTS Bilateral 06/17/2015   Procedure: DEBRIDEMENT OF BILATERAL MASTECTOMY FLAP WITH BILATERAL TISSUE EXPANDER EXCHANGE ;  Surgeon: Irene Limbo, MD;  Location: [place];  Service: Plastics;  Laterality: Bilateral;  . REMOVAL OF TISSUE EXPANDER Bilateral 07/12/2015   Procedure: REMOVAL OF BILATERAL TISSUE EXPANDERS;  Surgeon: Irene Limbo, MD;  Location: [place];  Service: Plastics;  Laterality: Bilateral;    FAMHx:  Family History  Problem Relation Age of Onset  . Cervical cancer Mother 74  . Colon cancer Maternal Grandfather     mets to stomach; dx. 67-70  . Heart disease Father   . Prostate cancer Father 59  . Cervical cancer Maternal Grandmother     dx. 3s; treated with radium implant  . Lung cancer Sister 64    maternal half-sister dx. lung cancer, stage III  . Breast cancer Maternal Aunt   . Cervical cancer Sister 42    paternal half-sister; s/p TAH  . Spina bifida Grandchild   . Brain cancer Grandchild     grandson dx. at 20 mos, treated at [facility]  . Breast cancer Other 41    maternal half-sister's daughter  . Cancer Maternal Aunt     d. 67; unspecified type - "began at back area and moved to vital  organs"  . Cancer Maternal Uncle      late 72s; unspecified type; "started at back and moved to vital organs"    SOCHx:   reports that she has been smoking Cigarettes.  She has been smoking about 0.25 packs per day. She has never used smokeless tobacco. She reports that she drinks alcohol. She reports that she does not use drugs.  ALLERGIES:  Allergies  Allergen Reactions  . Rocephin [Ceftriaxone Sodium In Dextrose] Shortness Of Breath and Rash  . Clindamycin/Lincomycin Nausea And Vomiting  . Gabapentin Other (See Comments)    Personality Changes  . Lyrica [Pregabalin] Other (See Comments)    PERSONALITY CHANGE    ROS: Pertinent items noted in HPI and remainder of comprehensive ROS otherwise negative.  HOME MEDS: Current Outpatient Prescriptions on File Prior to Visit  Medication Sig Dispense Refill  . Aclidinium Bromide (TUDORZA PRESSAIR) 400 MCG/ACT AEPB Inhale 1 puff into the lungs daily as needed (for respiratory issues.).     Marland Kitchen aspirin EC 81 MG tablet Take 81 mg by mouth every other  day.     . atorvastatin (LIPITOR) 10 MG tablet Take 10 mg by mouth at bedtime.     Marland Kitchen BREO ELLIPTA 100-25 MCG/INH AEPB Inhale 1 puff into the lungs daily as needed (for respiratory issues).     . Cholecalciferol (VITAMIN D3) 50000 UNITS CAPS Take 50,000 Units by mouth every 30 (thirty) days.     . furosemide (LASIX) 20 MG tablet Take 20 mg by mouth daily as needed for edema (for leg swelling).     Marland Kitchen HYDROcodone-acetaminophen (NORCO/VICODIN) 5-325 MG tablet Take 1 tablet by mouth every 4 (four) hours as needed (for pain.).     Marland Kitchen hydrOXYzine (ATARAX/VISTARIL) 50 MG tablet Take 50 mg by mouth at bedtime.     . mirtazapine (REMERON) 30 MG tablet Take 30 mg by mouth at bedtime.    Marland Kitchen PARoxetine (PAXIL) 40 MG tablet Take 40 mg by mouth at bedtime.     Vladimir Faster Glycol-Propyl Glycol (SYSTANE ULTRA) 0.4-0.3 % SOLN Place 1 drop into both eyes daily as needed (for dry eyes).     . pramipexole (MIRAPEX) 1 MG  tablet Take 1 mg by mouth at bedtime.    . promethazine (PHENERGAN) 25 MG tablet Take 25 mg by mouth every 6 (six) hours as needed for nausea or vomiting.    . SUMAtriptan (IMITREX) 100 MG tablet Take 100 mg by mouth daily as needed for migraine (100 mg and then may repeat in 2 hours if headache persists).     . tamoxifen (NOLVADEX) 20 MG tablet Take 20 mg by mouth at bedtime.     . topiramate (TOPAMAX) 50 MG tablet Take 50 mg by mouth at bedtime.    . traZODone (DESYREL) 100 MG tablet Take 50 mg by mouth at bedtime.     . VENTOLIN HFA 108 (90 BASE) MCG/ACT inhaler Inhale 1 puff into the lungs every 6 (six) hours as needed for wheezing or shortness of breath.      No current facility-administered medications on file prior to visit.     LABS/IMAGING: No results found for this or any previous visit (from the past 48 hour(s)). No results found.  WEIGHTS: Wt Readings from Last 3 Encounters:  07/02/16 115 lb 12.8 oz (52.5 kg)  05/04/16 119 lb 12.8 oz (54.3 kg)  07/12/15 106 lb (48.1 kg)    VITALS: BP (!) 114/56   Pulse 83   Ht 5\' 1"  (1.549 m)   Wt 115 lb 12.8 oz (52.5 kg)   BMI 21.88 kg/m   EXAM: Deferred  EKG: Deferred  ASSESSMENT: 1. Drop syncope 2. ?Complex-partial seizure  PLAN: 1.   Anne [name] had another syncopal event which was captured on her implanted loop recorder. There is no evidence of arrhythmia or cardiac etiology to explain her sacral episode. She may have been hypotensive at the time. I wonder she could be having seizure or medication related side effect causing her episodes. She is on numerous medications as described above which could potentially interact and the indications for all of them are not totally clear. I believe easy to be sorted out between her primary care provider and perhaps a good neurologist. She may have an underlying sleep disorder which would be helpful to try to diagnose and treat rather than have her on numerous sleep medications. I'll  refer her to Dr. Roddie Mc.   Thanks for this kind referral. She can follow-up with me in 3 months.  Pixie Casino, MD, [facility] Attending Cardiologist [place]  Nadean Corwin Kambrey Hagger 07/02/2016, 4:36 PM

## 2016-07-03 ENCOUNTER — Encounter (HOSPITAL_COMMUNITY): Payer: Self-pay | Admitting: Internal Medicine

## 2016-07-03 ENCOUNTER — Emergency Department (HOSPITAL_COMMUNITY): Payer: Medicare Other

## 2016-07-03 ENCOUNTER — Inpatient Hospital Stay (HOSPITAL_COMMUNITY)
Admission: EM | Admit: 2016-07-03 | Discharge: 2016-07-04 | DRG: 312 | Disposition: A | Discharge status: Home / Self Care | Payer: Medicare Other | Attending: Internal Medicine | Admitting: Internal Medicine

## 2016-07-03 ENCOUNTER — Observation Stay (HOSPITAL_COMMUNITY): Payer: Medicare Other

## 2016-07-03 DIAGNOSIS — Y92009 Unspecified place in unspecified non-institutional (private) residence as the place of occurrence of the external cause: Secondary | ICD-10-CM

## 2016-07-03 DIAGNOSIS — G2581 Restless legs syndrome: Secondary | ICD-10-CM | POA: Diagnosis present

## 2016-07-03 DIAGNOSIS — I959 Hypotension, unspecified: Secondary | ICD-10-CM | POA: Diagnosis not present

## 2016-07-03 DIAGNOSIS — F17210 Nicotine dependence, cigarettes, uncomplicated: Secondary | ICD-10-CM | POA: Diagnosis not present

## 2016-07-03 DIAGNOSIS — I10 Essential (primary) hypertension: Secondary | ICD-10-CM | POA: Diagnosis not present

## 2016-07-03 DIAGNOSIS — Z9012 Acquired absence of left breast and nipple: Secondary | ICD-10-CM

## 2016-07-03 DIAGNOSIS — M549 Dorsalgia, unspecified: Secondary | ICD-10-CM | POA: Diagnosis not present

## 2016-07-03 DIAGNOSIS — W19XXXA Unspecified fall, initial encounter: Secondary | ICD-10-CM | POA: Diagnosis present

## 2016-07-03 DIAGNOSIS — T4275XA Adverse effect of unspecified antiepileptic and sedative-hypnotic drugs, initial encounter: Secondary | ICD-10-CM | POA: Diagnosis present

## 2016-07-03 DIAGNOSIS — Z881 Allergy status to other antibiotic agents status: Secondary | ICD-10-CM

## 2016-07-03 DIAGNOSIS — Z8673 Personal history of transient ischemic attack (TIA), and cerebral infarction without residual deficits: Secondary | ICD-10-CM | POA: Diagnosis not present

## 2016-07-03 DIAGNOSIS — Z7951 Long term (current) use of inhaled steroids: Secondary | ICD-10-CM

## 2016-07-03 DIAGNOSIS — Z888 Allergy status to other drugs, medicaments and biological substances status: Secondary | ICD-10-CM

## 2016-07-03 DIAGNOSIS — E876 Hypokalemia: Secondary | ICD-10-CM | POA: Diagnosis present

## 2016-07-03 DIAGNOSIS — S299XXA Unspecified injury of thorax, initial encounter: Secondary | ICD-10-CM | POA: Diagnosis not present

## 2016-07-03 DIAGNOSIS — I951 Orthostatic hypotension: Secondary | ICD-10-CM

## 2016-07-03 DIAGNOSIS — Z79899 Other long term (current) drug therapy: Secondary | ICD-10-CM | POA: Diagnosis not present

## 2016-07-03 DIAGNOSIS — F329 Major depressive disorder, single episode, unspecified: Secondary | ICD-10-CM | POA: Diagnosis present

## 2016-07-03 DIAGNOSIS — E860 Dehydration: Secondary | ICD-10-CM | POA: Diagnosis not present

## 2016-07-03 DIAGNOSIS — Z803 Family history of malignant neoplasm of breast: Secondary | ICD-10-CM

## 2016-07-03 DIAGNOSIS — Z96652 Presence of left artificial knee joint: Secondary | ICD-10-CM | POA: Diagnosis present

## 2016-07-03 DIAGNOSIS — C50912 Malignant neoplasm of unspecified site of left female breast: Secondary | ICD-10-CM | POA: Diagnosis present

## 2016-07-03 DIAGNOSIS — Z720 Tobacco use: Secondary | ICD-10-CM | POA: Diagnosis present

## 2016-07-03 DIAGNOSIS — G8929 Other chronic pain: Secondary | ICD-10-CM | POA: Diagnosis present

## 2016-07-03 DIAGNOSIS — G43909 Migraine, unspecified, not intractable, without status migrainosus: Secondary | ICD-10-CM | POA: Diagnosis not present

## 2016-07-03 DIAGNOSIS — Z79891 Long term (current) use of opiate analgesic: Secondary | ICD-10-CM

## 2016-07-03 DIAGNOSIS — I952 Hypotension due to drugs: Principal | ICD-10-CM | POA: Diagnosis present

## 2016-07-03 DIAGNOSIS — J449 Chronic obstructive pulmonary disease, unspecified: Secondary | ICD-10-CM | POA: Diagnosis present

## 2016-07-03 DIAGNOSIS — E785 Hyperlipidemia, unspecified: Secondary | ICD-10-CM | POA: Diagnosis present

## 2016-07-03 DIAGNOSIS — J441 Chronic obstructive pulmonary disease with (acute) exacerbation: Secondary | ICD-10-CM

## 2016-07-03 DIAGNOSIS — R55 Syncope and collapse: Secondary | ICD-10-CM

## 2016-07-03 DIAGNOSIS — J410 Simple chronic bronchitis: Secondary | ICD-10-CM

## 2016-07-03 DIAGNOSIS — Z7982 Long term (current) use of aspirin: Secondary | ICD-10-CM

## 2016-07-03 DIAGNOSIS — J418 Mixed simple and mucopurulent chronic bronchitis: Secondary | ICD-10-CM

## 2016-07-03 DIAGNOSIS — Z79810 Long term (current) use of selective estrogen receptor modulators (SERMs): Secondary | ICD-10-CM

## 2016-07-03 DIAGNOSIS — M545 Low back pain: Secondary | ICD-10-CM | POA: Diagnosis not present

## 2016-07-03 HISTORY — DX: Chronic obstructive pulmonary disease with (acute) exacerbation: J44.1

## 2016-07-03 HISTORY — DX: Tobacco use: Z72.0

## 2016-07-03 LAB — MAGNESIUM: MAGNESIUM: 1.9 mg/dL (ref 1.7–2.4)

## 2016-07-03 LAB — BASIC METABOLIC PANEL
Anion gap: 3 — ABNORMAL LOW (ref 5–15)
BUN: 11 mg/dL (ref 6–20)
CALCIUM: 7.7 mg/dL — AB (ref 8.9–10.3)
CO2: 21 mmol/L — AB (ref 22–32)
Chloride: 120 mmol/L — ABNORMAL HIGH (ref 101–111)
Creatinine, Ser: 0.76 mg/dL (ref 0.44–1.00)
GFR calc Af Amer: >60 mL/min (ref >60)
GFR calc non Af Amer: >60 mL/min (ref >60)
GLUCOSE: 119 mg/dL — AB (ref 65–99)
Potassium: 3.9 mmol/L (ref 3.5–5.1)
Sodium: 144 mmol/L (ref 135–145)

## 2016-07-03 LAB — CBC
HCT: 37.3 % (ref 36.0–46.0)
HEMOGLOBIN: 11.9 g/dL — AB (ref 12.0–15.0)
MCH: 29.1 pg (ref 26.0–34.0)
MCHC: 31.9 g/dL (ref 30.0–36.0)
MCV: 91.2 fL (ref 78.0–100.0)
Platelets: 164 10*3/uL (ref 150–400)
RBC: 4.09 MIL/uL (ref 3.87–5.11)
RDW: 13.6 % (ref 11.5–15.5)
WBC: 6.7 10*3/uL (ref 4.0–10.5)

## 2016-07-03 LAB — CBC WITH DIFFERENTIAL/PLATELET
BASOS ABS: 0 10*3/uL (ref 0.0–0.1)
Basophils Relative: 0 %
EOS PCT: 2 %
Eosinophils Absolute: 0.1 10*3/uL (ref 0.0–0.7)
HCT: 35.1 % — ABNORMAL LOW (ref 36.0–46.0)
HEMOGLOBIN: 11.1 g/dL — AB (ref 12.0–15.0)
LYMPHS ABS: 2.4 10*3/uL (ref 0.7–4.0)
LYMPHS PCT: 37 %
MCH: 28.7 pg (ref 26.0–34.0)
MCHC: 31.6 g/dL (ref 30.0–36.0)
MCV: 90.7 fL (ref 78.0–100.0)
Monocytes Absolute: 0.5 10*3/uL (ref 0.1–1.0)
Monocytes Relative: 8 %
NEUTROS ABS: 3.6 10*3/uL (ref 1.7–7.7)
NEUTROS PCT: 53 %
Platelets: 168 10*3/uL (ref 150–400)
RBC: 3.87 MIL/uL (ref 3.87–5.11)
RDW: 13.3 % (ref 11.5–15.5)
WBC: 6.7 10*3/uL (ref 4.0–10.5)

## 2016-07-03 LAB — COMPREHENSIVE METABOLIC PANEL
ALT: 18 U/L (ref 14–54)
ANION GAP: 5 (ref 5–15)
AST: 23 U/L (ref 15–41)
Albumin: 2.9 g/dL — ABNORMAL LOW (ref 3.5–5.0)
Alkaline Phosphatase: 59 U/L (ref 38–126)
BILIRUBIN TOTAL: 0.4 mg/dL (ref 0.3–1.2)
BUN: 16 mg/dL (ref 6–20)
CO2: 22 mmol/L (ref 22–32)
Calcium: 8.1 mg/dL — ABNORMAL LOW (ref 8.9–10.3)
Chloride: 114 mmol/L — ABNORMAL HIGH (ref 101–111)
Creatinine, Ser: 0.85 mg/dL (ref 0.44–1.00)
GFR calc Af Amer: >60 mL/min (ref >60)
Glucose, Bld: 89 mg/dL (ref 65–99)
POTASSIUM: 3 mmol/L — AB (ref 3.5–5.1)
Sodium: 141 mmol/L (ref 135–145)
TOTAL PROTEIN: 5.1 g/dL — AB (ref 6.5–8.1)

## 2016-07-03 LAB — SALICYLATE LEVEL

## 2016-07-03 LAB — I-STAT CG4 LACTIC ACID, ED: Lactic Acid, Venous: 0.76 mmol/L (ref 0.5–1.9)

## 2016-07-03 LAB — ETHANOL: Alcohol, Ethyl (B): <5 mg/dL (ref <5)

## 2016-07-03 LAB — RAPID URINE DRUG SCREEN, HOSP PERFORMED
Amphetamines: NOT DETECTED
BARBITURATES: NOT DETECTED
BENZODIAZEPINES: POSITIVE — AB
Cocaine: NOT DETECTED
Opiates: NOT DETECTED
Tetrahydrocannabinol: NOT DETECTED

## 2016-07-03 LAB — ACETAMINOPHEN LEVEL

## 2016-07-03 LAB — TROPONIN I

## 2016-07-03 MED ORDER — TIOTROPIUM BROMIDE MONOHYDRATE 18 MCG IN CAPS
18.0000 ug | ORAL_CAPSULE | Freq: Every day | RESPIRATORY_TRACT | Status: DC
  Administered 2016-07-03 – 2016-07-04 (×2): 18 ug via RESPIRATORY_TRACT
  Filled 2016-07-03: qty 5

## 2016-07-03 MED ORDER — TAMOXIFEN CITRATE 10 MG PO TABS
20.0000 mg | ORAL_TABLET | Freq: Every day | ORAL | Status: DC
  Administered 2016-07-03: 20 mg via ORAL
  Filled 2016-07-03: qty 2

## 2016-07-03 MED ORDER — HYPROMELLOSE (GONIOSCOPIC) 2.5 % OP SOLN
1.0000 [drp] | Freq: Every day | OPHTHALMIC | Status: DC | PRN
  Filled 2016-07-03: qty 15

## 2016-07-03 MED ORDER — SODIUM CHLORIDE 0.9 % IV SOLN
INTRAVENOUS | Status: DC
  Administered 2016-07-03 – 2016-07-04 (×2): via INTRAVENOUS

## 2016-07-03 MED ORDER — ZOLPIDEM TARTRATE 5 MG PO TABS
5.0000 mg | ORAL_TABLET | Freq: Every evening | ORAL | Status: DC | PRN
  Administered 2016-07-03: 5 mg via ORAL
  Filled 2016-07-03: qty 1

## 2016-07-03 MED ORDER — SUMATRIPTAN SUCCINATE 100 MG PO TABS
100.0000 mg | ORAL_TABLET | Freq: Every day | ORAL | Status: DC | PRN
  Filled 2016-07-03: qty 1

## 2016-07-03 MED ORDER — ACETAMINOPHEN 650 MG RE SUPP: 650.0000 mg | Freq: Four times a day (QID) | RECTAL | Status: DC | PRN

## 2016-07-03 MED ORDER — ONDANSETRON HCL 4 MG/2ML IJ SOLN: 4.0000 mg | Freq: Four times a day (QID) | INTRAMUSCULAR | Status: DC | PRN

## 2016-07-03 MED ORDER — SODIUM CHLORIDE 0.9 % IV BOLUS (SEPSIS)
1000.0000 mL | Freq: Once | INTRAVENOUS | Status: AC
  Administered 2016-07-03: 1000 mL via INTRAVENOUS

## 2016-07-03 MED ORDER — ASPIRIN EC 81 MG PO TBEC
81.0000 mg | DELAYED_RELEASE_TABLET | ORAL | Status: DC
  Administered 2016-07-03: 81 mg via ORAL
  Filled 2016-07-03: qty 1

## 2016-07-03 MED ORDER — ENOXAPARIN SODIUM 30 MG/0.3ML ~~LOC~~ SOLN
30.0000 mg | SUBCUTANEOUS | Status: DC
  Filled 2016-07-03: qty 0.3

## 2016-07-03 MED ORDER — ALBUTEROL SULFATE (2.5 MG/3ML) 0.083% IN NEBU: 2.5000 mg | INHALATION_SOLUTION | RESPIRATORY_TRACT | Status: DC | PRN

## 2016-07-03 MED ORDER — NICOTINE 21 MG/24HR TD PT24: 21.0000 mg | MEDICATED_PATCH | Freq: Every day | TRANSDERMAL | 0 refills | Status: DC

## 2016-07-03 MED ORDER — NICOTINE 21 MG/24HR TD PT24
21.0000 mg | MEDICATED_PATCH | Freq: Every day | TRANSDERMAL | Status: DC
  Administered 2016-07-03 – 2016-07-04 (×2): 21 mg via TRANSDERMAL
  Filled 2016-07-03 (×2): qty 1

## 2016-07-03 MED ORDER — ACETAMINOPHEN 325 MG PO TABS: 650.0000 mg | ORAL_TABLET | Freq: Four times a day (QID) | ORAL | Status: DC | PRN

## 2016-07-03 MED ORDER — ONDANSETRON HCL 4 MG PO TABS: 4.0000 mg | ORAL_TABLET | Freq: Four times a day (QID) | ORAL | Status: DC | PRN

## 2016-07-03 MED ORDER — FLUTICASONE FUROATE-VILANTEROL 100-25 MCG/INH IN AEPB
1.0000 | INHALATION_SPRAY | Freq: Every day | RESPIRATORY_TRACT | Status: DC
  Administered 2016-07-03 – 2016-07-04 (×2): 1 via RESPIRATORY_TRACT
  Filled 2016-07-03: qty 28

## 2016-07-03 MED ORDER — ATORVASTATIN CALCIUM 10 MG PO TABS
10.0000 mg | ORAL_TABLET | Freq: Every day | ORAL | Status: DC
  Administered 2016-07-03: 10 mg via ORAL
  Filled 2016-07-03: qty 1

## 2016-07-03 MED ORDER — SODIUM CHLORIDE 0.9% FLUSH: 3.0000 mL | Freq: Two times a day (BID) | INTRAVENOUS | Status: DC

## 2016-07-03 MED ORDER — POTASSIUM CHLORIDE 20 MEQ/15ML (10%) PO SOLN
40.0000 meq | Freq: Once | ORAL | Status: AC
  Administered 2016-07-03: 40 meq via ORAL
  Filled 2016-07-03: qty 30

## 2016-07-03 MED ORDER — ENOXAPARIN SODIUM 40 MG/0.4ML ~~LOC~~ SOLN: 40.0000 mg | SUBCUTANEOUS | Status: DC

## 2016-07-03 NOTE — Progress Notes (Signed)
Patient seen and examined  71 y.o. female with medical history significant of hyperlipidemia, COPD, asthma, depression, RLS, migraine headache, TIA, left breast cancer (s/p of mastectomy, on tamoxifen), s/p of loop recorder, tobacco abuse, who presents with syncope.Per Dr. Lysbeth Penner clinic note, pt had another syncopal event which was captured on her implanted loop recorder and there is no evidence of arrhythmia or cardiac etiology to explain her syncope per Dr. Debara Pickett. She was noted to have low blood pressure during these episodes. Patient indeed had a blood pressure of 88 x 46 when she presented to the ER oxygen saturation 97% on room air, negative chest x-ray, negative x-ray of her lumbar spine for acute issues. CT of head and CT of C-spine is negative for acute abnormalities. Patient is placed on telemetry bed for observation.  Assessment and plan Syncope and collapse:  could be polypharmacy versus orthostatic hypotension. Patient had a loop recorder placed on 05/11/16. Per Dr. Nona Dell clinic note,  there is no evidence of arrhythmia or cardiac etiology to explain her syncope. Likely due to multifocal factor etiology, including hypotension, dehydration, polypharmacy (patient is taking multiple sedative medications). Will obtain pharmacy consult to see which medications can cause orthostatic hypotension Other differential diagnoses including TIA and seizure. Pt had carotid Doppler on 05/17/16 which is not impressive (1-39% bilateral ICA stenosis). Pt has lower recorder and cannot do MRI of brain. Telemetry shows normal sinus rhythm - Continue checking Orthostatic vital signs  - 2d echo pending  - Neuro checks  - EEG pending  - PT/OT eval and treat - IVF for hypotension -Hold sedative medications: hydroxyzine, Remeron, Paxil, trazodone, pramipexole, Phenergan, EOMI, Restoril -Frequent neuro check  Hypotension:  Patient does not seem to have infection. Likely due to dehydration. Patient is  clinically dry on physical examination. -IV fluids: 2 L normal saline, followed by 100 mL per hour -Hold Lasix Orthostatics negative this am   Breast cancer, left [facility]): s/p of left mastectomy. -continue tamoxifen  Hypokalemia: K= 3.0 on admission. - Repleted, now 3.9 Magnesium 1.9  COPD: stable -When necessary albuterol nebulizers, -Breo Ellipta inhaler and spiriva inhaler  HLD: -lipitor  Tobacco abuse: -Did counseling about importance of quitting smoking -Nicotine patch

## 2016-07-03 NOTE — Progress Notes (Signed)
EEG completed, results pending. 

## 2016-07-03 NOTE — Care Management Obs Status (Signed)
[facility] NOTIFICATION   Patient Details  Name: Anne [name] MRN: [id number] Date of Birth: 09/01/1945   Medicare Observation Status Notification Given:  Yes    Erenest Rasher, RN 07/03/2016, 9:17 AM

## 2016-07-03 NOTE — ED Notes (Signed)
Pt ambulated independently with a steady gait. 

## 2016-07-03 NOTE — Discharge Summary (Addendum)
Physician Discharge Summary  Anne [name] MRN: [id number] DOB/AGE: 1945/07/08 71 y.o.  PCP: [name], MD   Admit date: 07/03/2016 Discharge date: 07/03/2016  Discharge Diagnoses:    Principal Problem:   Syncope and collapse Active Problems:   Breast cancer, left (HCC)   Syncope   Hypokalemia   Hypotension   COPD (chronic obstructive pulmonary disease) (HCC)   HLD (hyperlipidemia)   Tobacco abuse    Follow-up recommendations Follow-up with PCP in 3-5 days , including all  additional recommended appointments as below Follow-up CBC, CMP in 3-5 days her med list and it indicates she is on Vicodin, hydroxyzine, mirtazapine, pramipexole, peroxetine, temazepam, topiramate and trazodone-PCP and neurology requested to check these medications  Before the patient decided to leave this morning without waiting to receive results of her 2-D echo or results of ILR interrogation. Patient was clearly instructed to wait for the results of  these  2 evaluations, however   patient has been requesting to leave since yesterday and could not wait for these results    Current Discharge Medication List    START taking these medications   Details  nicotine (NICODERM CQ - DOSED IN MG/24 HOURS) 21 mg/24hr patch Place 1 patch (21 mg total) onto the skin daily. Qty: 28 patch, Refills: 0      CONTINUE these medications which have NOT CHANGED   Details  Aclidinium Bromide (TUDORZA PRESSAIR) 400 MCG/ACT AEPB Inhale 1 puff into the lungs daily as needed (for respiratory issues.).     aspirin EC 81 MG tablet Take 81 mg by mouth every other day.     atorvastatin (LIPITOR) 10 MG tablet Take 10 mg by mouth at bedtime.     BREO ELLIPTA 100-25 MCG/INH AEPB Inhale 1 puff into the lungs daily as needed (for respiratory issues).     Cholecalciferol (VITAMIN D3) 50000 UNITS CAPS Take 50,000 Units by mouth every 30 (thirty) days.     furosemide (LASIX) 20 MG tablet Take 20 mg by mouth daily as needed for  edema (for leg swelling).     HYDROcodone-acetaminophen (NORCO/VICODIN) 5-325 MG tablet Take 1 tablet by mouth every 4 (four) hours as needed (for pain.).     hydrOXYzine (ATARAX/VISTARIL) 50 MG tablet Take 50 mg by mouth at bedtime.     mirtazapine (REMERON) 30 MG tablet Take 30 mg by mouth at bedtime.    Polyethyl Glycol-Propyl Glycol (SYSTANE ULTRA) 0.4-0.3 % SOLN Place 1 drop into both eyes daily as needed (for dry eyes).     SUMAtriptan (IMITREX) 100 MG tablet Take 100 mg by mouth daily as needed for migraine (100 mg and then may repeat in 2 hours if headache persists).     tamoxifen (NOLVADEX) 20 MG tablet Take 20 mg by mouth at bedtime.     temazepam (RESTORIL) 30 MG capsule     topiramate (TOPAMAX) 50 MG tablet Take 50 mg by mouth at bedtime.    VENTOLIN HFA 108 (90 BASE) MCG/ACT inhaler Inhale 1 puff into the lungs every 6 (six) hours as needed for wheezing or shortness of breath.       STOP taking these medications     PARoxetine (PAXIL) 40 MG tablet      pramipexole (MIRAPEX) 1 MG tablet      promethazine (PHENERGAN) 25 MG tablet      traZODone (DESYREL) 100 MG tablet          Discharge Condition: Stable   Discharge Instructions Get Medicines reviewed and adjusted:  Please take all your medications with you for your next visit with your Primary MD  Please request your Primary MD to go over all hospital tests and procedure/radiological results at the follow up, please ask your Primary MD to get all Hospital records sent to his/her office.  If you experience worsening of your admission symptoms, develop shortness of breath, life threatening emergency, suicidal or homicidal thoughts you must seek medical attention immediately by calling 911 or calling your MD immediately if symptoms less severe.  You must read complete instructions/literature along with all the possible adverse reactions/side effects for all the Medicines you take and that have been prescribed to  you. Take any new Medicines after you have completely understood and accpet all the possible adverse reactions/side effects.   Do not drive when taking Pain medications.   Do not take more than prescribed Pain, Sleep and Anxiety Medications  Special Instructions: If you have smoked or chewed Tobacco in the last 2 yrs please stop smoking, stop any regular Alcohol and or any Recreational drug use.  Wear Seat belts while driving.  Please note  You were cared for by a hospitalist during your hospital stay. Once you are discharged, your primary care physician will handle any further medical issues. Please note that NO REFILLS for any discharge medications will be authorized once you are discharged, as it is imperative that you return to your primary care physician (or establish a relationship with a primary care physician if you do not have one) for your aftercare needs so that they can reassess your need for medications and monitor your lab values.     Allergies  Allergen Reactions  . Rocephin [Ceftriaxone Sodium In Dextrose] Shortness Of Breath and Rash  . Clindamycin/Lincomycin Nausea And Vomiting  . Gabapentin Other (See Comments)    Personality Changes  . Lyrica [Pregabalin] Other (See Comments)    PERSONALITY CHANGE      Disposition: 01-Home or Self Care   Consults:  Cardiology for ILR  interrogation    Significant Diagnostic Studies:  Dg Chest 2 View  Result Date: 07/03/2016 CLINICAL DATA:  Syncope with fall and back pain EXAM: CHEST  2 VIEW COMPARISON:  CT chest 06/28/2015, radiograph 04/16/2013 FINDINGS: Left CP angle is not included on the frontal view. Surgical clips in the right axilla. No pleural effusion or pneumothorax. Borderline cardiomegaly. IMPRESSION: 1. Borderline cardiomegaly. 2. No acute infiltrate or edema. Electronically Signed   By: Donavan Foil M.D.   On: 07/03/2016 02:32   Dg Lumbar Spine Complete  Result Date: 07/03/2016 CLINICAL DATA:  Syncope,  low back pain EXAM: LUMBAR SPINE - COMPLETE 4+ VIEW COMPARISON:  CT abdomen pelvis 03/28/2016 FINDINGS: 5 non rib-bearing lumbar type vertebra. The SI joints are symmetric. Lumbar alignment is within normal limits. Vertebral body heights are normal. Moderate degenerative disc changes at L4-L5 and L5-S1, mild to moderate changes at L1-L2 and L2-L3. Multilevel anterior osteophyte. Aortic atherosclerosis. IMPRESSION: Degenerative changes.  No definite acute osseous abnormality. Electronically Signed   By: Donavan Foil M.D.   On: 07/03/2016 02:31   Ct Head Wo Contrast  Result Date: 07/03/2016 CLINICAL DATA:  Syncope history of migraine with concussion EXAM: CT HEAD WITHOUT CONTRAST CT CERVICAL SPINE WITHOUT CONTRAST TECHNIQUE: Multidetector CT imaging of the head and cervical spine was performed following the standard protocol without intravenous contrast. Multiplanar CT image reconstructions of the cervical spine were also generated. COMPARISON:  09/21/2015, 12/07/2014, MRI a 11/19/2014 FINDINGS: CT HEAD FINDINGS Brain: No acute  territorial infarction, hemorrhage or intracranial mass is visualized. There is mild atrophy. The ventricles are nonenlarged. Vascular: No hyperdense vessels. Scattered calcifications at the carotid siphons. Skull: No depressed skull fracture. Old fracture deformity of the right mandibular condyles. Sinuses/Orbits: Mucosal thickening in the maxillary sinuses with probable right antrostomy. Mucosal thickening in the ethmoid sinuses. No acute orbital abnormality. Bilateral lens extraction Other: None CT CERVICAL SPINE FINDINGS Alignment: No subluxation.  Facet alignment within normal limits. Skull base and vertebrae: No acute fracture. No primary bone lesion or focal pathologic process. Soft tissues and spinal canal: No prevertebral fluid or swelling. No visible canal hematoma. Disc levels: Mild to moderate degenerative disc changes at C5-C6, C6-C7 and C7-T1. Multilevel facet arthropathy.  Upper chest: Lung apices clear.  Thyroid unremarkable. Other: None IMPRESSION: 1. No CT evidence for acute intracranial abnormality. 2. No acute fracture or malalignment of the cervical spine 3. Old right mandibular condyle fracture. Electronically Signed   By: Donavan Foil M.D.   On: 07/03/2016 03:19   Ct Cervical Spine Wo Contrast  Result Date: 07/03/2016 CLINICAL DATA:  Syncope history of migraine with concussion EXAM: CT HEAD WITHOUT CONTRAST CT CERVICAL SPINE WITHOUT CONTRAST TECHNIQUE: Multidetector CT imaging of the head and cervical spine was performed following the standard protocol without intravenous contrast. Multiplanar CT image reconstructions of the cervical spine were also generated. COMPARISON:  09/21/2015, 12/07/2014, MRI a 11/19/2014 FINDINGS: CT HEAD FINDINGS Brain: No acute territorial infarction, hemorrhage or intracranial mass is visualized. There is mild atrophy. The ventricles are nonenlarged. Vascular: No hyperdense vessels. Scattered calcifications at the carotid siphons. Skull: No depressed skull fracture. Old fracture deformity of the right mandibular condyles. Sinuses/Orbits: Mucosal thickening in the maxillary sinuses with probable right antrostomy. Mucosal thickening in the ethmoid sinuses. No acute orbital abnormality. Bilateral lens extraction Other: None CT CERVICAL SPINE FINDINGS Alignment: No subluxation.  Facet alignment within normal limits. Skull base and vertebrae: No acute fracture. No primary bone lesion or focal pathologic process. Soft tissues and spinal canal: No prevertebral fluid or swelling. No visible canal hematoma. Disc levels: Mild to moderate degenerative disc changes at C5-C6, C6-C7 and C7-T1. Multilevel facet arthropathy. Upper chest: Lung apices clear.  Thyroid unremarkable. Other: None IMPRESSION: 1. No CT evidence for acute intracranial abnormality. 2. No acute fracture or malalignment of the cervical spine 3. Old right mandibular condyle fracture.  Electronically Signed   By: Donavan Foil M.D.   On: 07/03/2016 03:19    echocardiogram     Filed Weights   07/03/16 0037 07/03/16 0510  Weight: 52.2 kg (115 lb) 52.3 kg (115 lb 4.8 oz)     Microbiology: No results found for this or any previous visit (from the past 240 hour(s)).     Blood Culture No results found for: SDES, SPECREQUEST, CULT, REPTSTATUS    Labs: Results for orders placed or performed during the hospital encounter of 07/03/16 (from the past 48 hour(s))  CBC with Differential/Platelet     Status: Abnormal   Collection Time: 07/03/16  1:22 AM  Result Value Ref Range   WBC 6.7 4.0 - 10.5 K/uL   RBC 3.87 3.87 - 5.11 MIL/uL   Hemoglobin 11.1 (L) 12.0 - 15.0 g/dL   HCT 35.1 (L) 36.0 - 46.0 %   MCV 90.7 78.0 - 100.0 fL   MCH 28.7 26.0 - 34.0 pg   MCHC 31.6 30.0 - 36.0 g/dL   RDW 13.3 11.5 - 15.5 %   Platelets 168 150 - 400 K/uL  Neutrophils Relative % 53 %   Neutro Abs 3.6 1.7 - 7.7 K/uL   Lymphocytes Relative 37 %   Lymphs Abs 2.4 0.7 - 4.0 K/uL   Monocytes Relative 8 %   Monocytes Absolute 0.5 0.1 - 1.0 K/uL   Eosinophils Relative 2 %   Eosinophils Absolute 0.1 0.0 - 0.7 K/uL   Basophils Relative 0 %   Basophils Absolute 0.0 0.0 - 0.1 K/uL  Comprehensive metabolic panel     Status: Abnormal   Collection Time: 07/03/16  1:22 AM  Result Value Ref Range   Sodium 141 135 - 145 mmol/L   Potassium 3.0 (L) 3.5 - 5.1 mmol/L   Chloride 114 (H) 101 - 111 mmol/L   CO2 22 22 - 32 mmol/L   Glucose, Bld 89 65 - 99 mg/dL   BUN 16 6 - 20 mg/dL   Creatinine, Ser 0.85 0.44 - 1.00 mg/dL   Calcium 8.1 (L) 8.9 - 10.3 mg/dL   Total Protein 5.1 (L) 6.5 - 8.1 g/dL   Albumin 2.9 (L) 3.5 - 5.0 g/dL   AST 23 15 - 41 U/L   ALT 18 14 - 54 U/L   Alkaline Phosphatase 59 38 - 126 U/L   Total Bilirubin 0.4 0.3 - 1.2 mg/dL   GFR calc non Af Amer >60 >60 mL/min   GFR calc Af Amer >60 >60 mL/min    Comment: (NOTE) The eGFR has been calculated using the CKD EPI  equation. This calculation has not been validated in all clinical situations. eGFR's persistently <60 mL/min signify possible Chronic Kidney Disease.    Anion gap 5 5 - 15  Troponin I     Status: None   Collection Time: 07/03/16  1:22 AM  Result Value Ref Range   Troponin I <0.03 <0.03 ng/mL  Ethanol     Status: None   Collection Time: 07/03/16  1:26 AM  Result Value Ref Range   Alcohol, Ethyl (B) <5 <5 mg/dL    Comment:        LOWEST DETECTABLE LIMIT FOR SERUM ALCOHOL IS 5 mg/dL FOR MEDICAL PURPOSES ONLY   Acetaminophen level     Status: Abnormal   Collection Time: 07/03/16  1:26 AM  Result Value Ref Range   Acetaminophen (Tylenol), Serum <10 (L) 10 - 30 ug/mL    Comment:        THERAPEUTIC CONCENTRATIONS VARY SIGNIFICANTLY. A RANGE OF 10-30 ug/mL MAY BE AN EFFECTIVE CONCENTRATION FOR MANY PATIENTS. HOWEVER, SOME ARE BEST TREATED AT CONCENTRATIONS OUTSIDE THIS RANGE. ACETAMINOPHEN CONCENTRATIONS >150 ug/mL AT 4 HOURS AFTER INGESTION AND >50 ug/mL AT 12 HOURS AFTER INGESTION ARE OFTEN ASSOCIATED WITH TOXIC REACTIONS.   Salicylate level     Status: None   Collection Time: 07/03/16  1:26 AM  Result Value Ref Range   Salicylate Lvl <6.7 2.8 - 30.0 mg/dL  I-Stat CG4 Lactic Acid, ED     Status: None   Collection Time: 07/03/16  2:46 AM  Result Value Ref Range   Lactic Acid, Venous 0.76 0.5 - 1.9 mmol/L  Rapid urine drug screen (hospital performed)     Status: Abnormal   Collection Time: 07/03/16  4:32 AM  Result Value Ref Range   Opiates NONE DETECTED NONE DETECTED   Cocaine NONE DETECTED NONE DETECTED   Benzodiazepines POSITIVE (A) NONE DETECTED   Amphetamines NONE DETECTED NONE DETECTED   Tetrahydrocannabinol NONE DETECTED NONE DETECTED   Barbiturates NONE DETECTED NONE DETECTED    Comment:  DRUG SCREEN FOR MEDICAL PURPOSES ONLY.  IF CONFIRMATION IS NEEDED FOR ANY PURPOSE, NOTIFY LAB WITHIN 5 DAYS.        LOWEST DETECTABLE LIMITS FOR URINE DRUG  SCREEN Drug Class       Cutoff (ng/mL) Amphetamine      1000 Barbiturate      200 Benzodiazepine   591 Tricyclics       638 Opiates          300 Cocaine          300 THC              50   Magnesium     Status: None   Collection Time: 07/03/16  6:29 AM  Result Value Ref Range   Magnesium 1.9 1.7 - 2.4 mg/dL  Basic metabolic panel     Status: Abnormal   Collection Time: 07/03/16  6:29 AM  Result Value Ref Range   Sodium 144 135 - 145 mmol/L   Potassium 3.9 3.5 - 5.1 mmol/L    Comment: NO VISIBLE HEMOLYSIS   Chloride 120 (H) 101 - 111 mmol/L   CO2 21 (L) 22 - 32 mmol/L   Glucose, Bld 119 (H) 65 - 99 mg/dL   BUN 11 6 - 20 mg/dL   Creatinine, Ser 0.76 0.44 - 1.00 mg/dL   Calcium 7.7 (L) 8.9 - 10.3 mg/dL   GFR calc non Af Amer >60 >60 mL/min   GFR calc Af Amer >60 >60 mL/min    Comment: (NOTE) The eGFR has been calculated using the CKD EPI equation. This calculation has not been validated in all clinical situations. eGFR's persistently <60 mL/min signify possible Chronic Kidney Disease.    Anion gap 3 (L) 5 - 15  CBC     Status: Abnormal   Collection Time: 07/03/16  6:29 AM  Result Value Ref Range   WBC 6.7 4.0 - 10.5 K/uL   RBC 4.09 3.87 - 5.11 MIL/uL   Hemoglobin 11.9 (L) 12.0 - 15.0 g/dL   HCT 37.3 36.0 - 46.0 %   MCV 91.2 78.0 - 100.0 fL   MCH 29.1 26.0 - 34.0 pg   MCHC 31.9 30.0 - 36.0 g/dL   RDW 13.6 11.5 - 15.5 %   Platelets 164 150 - 400 K/uL     Lipid Panel  No results found for: CHOL, TRIG, HDL, CHOLHDL, VLDL, LDLCALC, LDLDIRECT     HPI   71 y.o.femalewith medical history significant of hyperlipidemia, COPD, asthma, depression, RLS, migraine headache, TIA, left breast cancer (s/p of mastectomy, on tamoxifen), s/p of loop recorder, tobacco abuse, who presents with syncope.Per Dr. Lysbeth Penner clinic note, pthad another syncopal event which was captured on her implanted loop recorder and there is no evidence of arrhythmia or cardiac etiology to explain her  syncope per Dr. Debara Pickett. She was noted to have low blood pressure during these episodes. Patient indeed had a blood pressure of 88 /46 when she presented to the ER oxygen saturation 97% on room air, negative chest x-ray, negative x-ray of her lumbar spine for acute issues. CT ofhead and CT of C-spine is negative for acute abnormalities. Patient is placed on telemetry bed for observation.  Assessment and plan Syncope and collapse: could be polypharmacy versus orthostatic hypotension. Patient had a loop recorder placed on 05/11/16. Per Dr. Nona Dell clinic note, there is no evidence of arrhythmia or cardiac etiology to explain her syncope. Likely due to multifocal factor etiology,including hypotension, dehydration, polypharmacy (patient is taking multiple  sedative medications).   Other differential diagnoses including TIA and seizure. Pt had carotid Doppler on 05/17/16 which is not impressive (1-39% bilateral ICA stenosis). Pt has lower recorder andcannot do MRI of brain. Telemetry shows normal sinus rhythm - Continue checking Orthostatic vital signs  - 2d echo pending  - Neuro checks stable - EEG  awake and asleep EEG is within normal limits - PT/OT eval and treat completed, no PT OT indicated Patient was hydrated with IV fluids -Recommended to hold Paxil, Phenergan, trazodone, Mirapex -Frequent neuro check remained stable Patient has been referred to outpatient neurology Cardiology requested to interrogate  ILR prior to discharge    Hypotension: Patient does not seem to have infection. Likely due to dehydration. Patient is clinically dry on physical examination. -IV fluids: 2 L normal saline, followed by 100 mL per hour Held Lasix Orthostatics negative this am   Breast cancer, left [facility]): s/p of left mastectomy. -continue tamoxifen  Hypokalemia: K= 3.0on admission. - Repleted, now 3.9 Magnesium 1.9  COPD:stable -When necessary albuterol nebulizers, -Breo Ellipta inhaler and  spiriva inhaler  HLD: -lipitor  Tobacco abuse: -Did counseling about importance of quitting smoking -Nicotine patch   Discharge Exam:   Blood pressure (!) 108/57, pulse 78, temperature 97.8 F (36.6 C), temperature source Oral, resp. rate 16, height 5' 1"  (1.549 m), weight 52.3 kg (115 lb 4.8 oz), SpO2 96 %.    Cardiac: S1/S2, RRR, No murmurs, No gallops or rubs. Respiratory:  No rales, wheezing, rhonchi or rubs. GI: Soft, nondistended, nontender, no rebound pain, no organomegaly, BS present. GU: No hematuria Ext: traced leg edema bilaterally. 2+DP/PT pulse bilaterally. Musculoskeletal: No joint deformities, No joint redness or warmth, no limitation of ROM in spin. Skin: No rashes.     SignedReyne Dumas 07/03/2016, 11:58 AM        Time spent >45 mins

## 2016-07-03 NOTE — Progress Notes (Signed)
Charge nurse and this RN spoke with patient about staying- patient finally decided to stay the night. Will continue to monitor. MD notified.

## 2016-07-03 NOTE — ED Provider Notes (Signed)
[facility] DEPT Provider Note   CSN: [id number] Arrival date & time: 07/03/16  0032  By signing my name below, I, Oleh Genin, attest that this documentation has been prepared under the direction and in the presence of Ezequiel Essex, MD. Electronically Signed: Oleh Genin, Scribe. 07/03/16. 1:08 AM.   History   Chief Complaint Chief Complaint  Patient presents with  . Fall  . Back Pain  . Loss of Consciousness    HPI Anne [name] is a 71 y.o. female with history of asthma, HTN, COPD, recurrent syncope with loop recorder, and prior TIA who presents to the ED following a fall. According to medic report, the patient was found by her roommate lying on the floor following a syncopal episode. At interview, the patient states that she was "fixing her medications for the week" when she acutely syncopized. She cannot recall any particular precipitating symptoms. No chest pain or pre-syncope that she can recall. She denies any alcohol consumption. When asked, she does believe her speech is slurred. Also reporting posterior neck pain; no headache. No chest or abdominal pain. No seizure history. No incontinence.   The history is provided by the patient. No language interpreter was used.  Loss of Consciousness   This is a new problem. The current episode started 1 to 2 hours ago. Episode frequency: Never before. The problem has been resolved. Length of episode of loss of consciousness: unknown downtime. The problem is associated with normal activity. Pertinent negatives include abdominal pain, chest pain, headaches, light-headedness and seizures.    Past Medical History:  Diagnosis Date  . Asthma    daily inhalers  . Breast cancer ([place]) 05/2015   left  . COPD (chronic obstructive pulmonary disease) ([place])    denies SOB with ADLs; no O2  . Dental crowns present   . Depression   . History of concussion 01/01/2014  . History of kidney stones   . History of TIA (transient  ischemic attack) 10/2014  . Hyperlipidemia   . Insomnia   . Migraine   . Nasal congestion 05/26/2015   will finish z-pak 05/27/2015  . Nonproductive cough 05/26/2015  . PONV (postoperative nausea and vomiting)    "long time ago"  none recently  . RLS (restless legs syndrome)   . Vitamin D deficiency   . Wears dentures    lower  . Wears partial dentures    upper    Patient Active Problem List   Diagnosis Date Noted  . Sleep disorder 07/02/2016  . Polypharmacy 07/02/2016  . Syncope and collapse 05/04/2016  . Carotid artery disease ([place]) 05/04/2016  . Genetic testing 06/02/2015  . Breast cancer, left ([place]) 06/02/2015  . Breast cancer ([place]) 04/26/2015  . Family history of breast cancer in female 04/26/2015  . Family history of colon cancer 04/26/2015  . History of basal cell carcinoma 04/26/2015    Past Surgical History:  Procedure Laterality Date  . ABDOMINAL HYSTERECTOMY  2000   complete  . APPENDECTOMY  2000  . BREAST BIOPSY Left   . BREAST RECONSTRUCTION WITH PLACEMENT OF TISSUE EXPANDER AND FLEX HD (ACELLULAR HYDRATED DERMIS) Bilateral 06/02/2015   Procedure: BILATERAL BREAST RECONSTRUCTION WITH PLACEMENT OF TISSUE EXPANDER AND  ACELLULAR HYDRATED DERMIS;  Surgeon: Irene Limbo, MD;  Location: [facility];  Service: Plastics;  Laterality: Bilateral;  . CARPAL TUNNEL RELEASE Bilateral   . CATARACT EXTRACTION Bilateral 09/2013  . KNEE ARTHROSCOPY Left   . LOOP RECORDER INSERTION N/A 05/11/2016   Procedure:  Loop Recorder Insertion;  Surgeon: Will Meredith Leeds, MD;  Location: [facility];  Service: Cardiovascular;  Laterality: N/A;  . MASTECTOMY W/ SENTINEL NODE BIOPSY Bilateral 06/02/2015   Procedure: BILATERAL MASTECTOMY WITH LEFT SENTINEL LYMPH NODE BIOPSY;  Surgeon: Stark Klein, MD;  Location: [facility];  Service: General;  Laterality: Bilateral;  . NASAL SEPTUM SURGERY     x 3  . PLANTAR FASCIA SURGERY Left   . REMOVAL OF BILATERAL  TISSUE EXPANDERS WITH PLACEMENT OF BILATERAL BREAST IMPLANTS Bilateral 06/17/2015   Procedure: DEBRIDEMENT OF BILATERAL MASTECTOMY FLAP WITH BILATERAL TISSUE EXPANDER EXCHANGE ;  Surgeon: Irene Limbo, MD;  Location: [facility];  Service: Plastics;  Laterality: Bilateral;  . REMOVAL OF TISSUE EXPANDER Bilateral 07/12/2015   Procedure: REMOVAL OF BILATERAL TISSUE EXPANDERS;  Surgeon: Irene Limbo, MD;  Location: [facility];  Service: Plastics;  Laterality: Bilateral;    OB History    No data available       Home Medications    Prior to Admission medications   Medication Sig Start Date End Date Taking? Authorizing Provider  Aclidinium Bromide (TUDORZA PRESSAIR) 400 MCG/ACT AEPB Inhale 1 puff into the lungs daily as needed (for respiratory issues.).     Historical Provider, MD  aspirin EC 81 MG tablet Take 81 mg by mouth every other day.     Historical Provider, MD  atorvastatin (LIPITOR) 10 MG tablet Take 10 mg by mouth at bedtime.     Historical Provider, MD  BREO ELLIPTA 100-25 MCG/INH AEPB Inhale 1 puff into the lungs daily as needed (for respiratory issues).  09/15/14   Historical Provider, MD  Cholecalciferol (VITAMIN D3) 50000 UNITS CAPS Take 50,000 Units by mouth every 30 (thirty) days.     Historical Provider, MD  furosemide (LASIX) 20 MG tablet Take 20 mg by mouth daily as needed for edema (for leg swelling).     Historical Provider, MD  HYDROcodone-acetaminophen (NORCO/VICODIN) 5-325 MG tablet Take 1 tablet by mouth every 4 (four) hours as needed (for pain.).  02/05/14   Historical Provider, MD  hydrOXYzine (ATARAX/VISTARIL) 50 MG tablet Take 50 mg by mouth at bedtime.     Historical Provider, MD  mirtazapine (REMERON) 30 MG tablet Take 30 mg by mouth at bedtime.    Historical Provider, MD  PARoxetine (PAXIL) 40 MG tablet Take 40 mg by mouth at bedtime.     Historical Provider, MD  Polyethyl Glycol-Propyl Glycol (SYSTANE ULTRA) 0.4-0.3 % SOLN Place 1 drop into both eyes  daily as needed (for dry eyes).     Historical Provider, MD  pramipexole (MIRAPEX) 1 MG tablet Take 1 mg by mouth at bedtime.    Historical Provider, MD  promethazine (PHENERGAN) 25 MG tablet Take 25 mg by mouth every 6 (six) hours as needed for nausea or vomiting.    Historical Provider, MD  SUMAtriptan (IMITREX) 100 MG tablet Take 100 mg by mouth daily as needed for migraine (100 mg and then may repeat in 2 hours if headache persists).  11/25/13   Historical Provider, MD  tamoxifen (NOLVADEX) 20 MG tablet Take 20 mg by mouth at bedtime.     Historical Provider, MD  temazepam (RESTORIL) 30 MG capsule  06/25/16   Historical Provider, MD  topiramate (TOPAMAX) 50 MG tablet Take 50 mg by mouth at bedtime. 03/27/16   Historical Provider, MD  traZODone (DESYREL) 100 MG tablet Take 50 mg by mouth at bedtime.  01/26/14   Historical Provider, MD  VENTOLIN HFA 108 (90 BASE) MCG/ACT inhaler Inhale 1 puff into the lungs every 6 (six) hours as needed for wheezing or shortness of breath.  09/29/14   Historical Provider, MD    Family History Family History  Problem Relation Age of Onset  . Cervical cancer Mother 27  . Colon cancer Maternal Grandfather     mets to stomach; dx. 67-70  . Heart disease Father   . Prostate cancer Father 21  . Cervical cancer Maternal Grandmother     dx. 78s; treated with radium implant  . Lung cancer Sister 72    maternal half-sister dx. lung cancer, stage III  . Breast cancer Maternal Aunt   . Cervical cancer Sister 58    paternal half-sister; s/p TAH  . Spina bifida Grandchild   . Brain cancer Grandchild     grandson dx. at 20 mos, treated at [facility]  . Breast cancer Other 4    maternal half-sister's daughter  . Cancer Maternal Aunt     d. 63; unspecified type - "began at back area and moved to vital organs"  . Cancer Maternal Uncle      late 51s; unspecified type; "started at back and moved to vital organs"    Social History Social History  Substance Use Topics  .  Smoking status: Light Tobacco Smoker    Packs/day: 0.25    Types: Cigarettes    Last attempt to quit: 05/04/2015  . Smokeless tobacco: Never Used  . Alcohol use 0.0 oz/week     Comment: rarely     Allergies   Rocephin [ceftriaxone sodium in dextrose]; Clindamycin/lincomycin; Gabapentin; and Lyrica [pregabalin]   Review of Systems Review of Systems  Cardiovascular: Positive for syncope. Negative for chest pain.  Gastrointestinal: Negative for abdominal pain.  Musculoskeletal: Positive for neck pain.  Neurological: Positive for syncope and speech difficulty. Negative for seizures, light-headedness and headaches.  All other systems reviewed and are negative.    Physical Exam Updated Vital Signs BP 124/84 (BP Location: Right Arm)   Pulse 72   Resp 15   Ht 5\' 1"  (1.549 m)   Wt 115 lb (52.2 kg)   SpO2 98%   BMI 21.73 kg/m   Physical Exam  Constitutional: She is oriented to person, place, and time. She appears well-developed and well-nourished. No distress.  HENT:  Head: Normocephalic and atraumatic.  Mouth/Throat: Oropharynx is clear and moist. No oropharyngeal exudate.  Eyes: Conjunctivae and EOM are normal. Pupils are equal, round, and reactive to light.  Neck: Normal range of motion. Neck supple.  No meningismus. Paracervical tenderness.   Cardiovascular: Normal rate, regular rhythm, normal heart sounds and intact distal pulses.   No murmur heard. Pulmonary/Chest: Effort normal and breath sounds normal. No respiratory distress.  Abdominal: Soft. There is no tenderness. There is no rebound and no guarding.  Musculoskeletal: Normal range of motion. She exhibits no edema or tenderness.  Pelvis is stable.  Neurological: She is alert and oriented to person, place, and time. No cranial nerve deficit. She exhibits normal muscle tone. Coordination normal.   5/5 strength throughout. CN 2-12 intact.Equal grip strength. Stuttering speech.  Skin: Skin is warm.  Old bruise in the L  upper arm  Psychiatric: She has a normal mood and affect. Her behavior is normal.  Nursing note and vitals reviewed.    ED Treatments / Results  Labs (all labs ordered are listed, but only abnormal results are displayed) Labs Reviewed  RAPID URINE DRUG SCREEN, HOSP PERFORMED -  Abnormal; Notable for the following:       Result Value   Benzodiazepines POSITIVE (*)    All other components within normal limits  CBC WITH DIFFERENTIAL/PLATELET - Abnormal; Notable for the following:    Hemoglobin 11.1 (*)    HCT 35.1 (*)    All other components within normal limits  COMPREHENSIVE METABOLIC PANEL - Abnormal; Notable for the following:    Potassium 3.0 (*)    Chloride 114 (*)    Calcium 8.1 (*)    Total Protein 5.1 (*)    Albumin 2.9 (*)    All other components within normal limits  ACETAMINOPHEN LEVEL - Abnormal; Notable for the following:    Acetaminophen (Tylenol), Serum <10 (*)    All other components within normal limits  BASIC METABOLIC PANEL - Abnormal; Notable for the following:    Chloride 120 (*)    CO2 21 (*)    Glucose, Bld 119 (*)    Calcium 7.7 (*)    Anion gap 3 (*)    All other components within normal limits  CBC - Abnormal; Notable for the following:    Hemoglobin 11.9 (*)    All other components within normal limits  TROPONIN I  ETHANOL  SALICYLATE LEVEL  MAGNESIUM  I-STAT CG4 LACTIC ACID, ED    EKG  EKG Interpretation  Date/Time:  Tuesday Jul 03 2016 00:32:39 EDT Ventricular Rate:  77 PR Interval:    QRS Duration: 107 QT Interval:  419 QTC Calculation: 475 R Axis:   89 Text Interpretation:  Sinus rhythm Anteroseptal infarct, age indeterminate Nonspecific ST abnormality Confirmed by Wyvonnia Dusky  MD, [place] [id number]) on 07/03/2016 12:56:48 AM       Radiology Dg Chest 2 View  Result Date: 07/03/2016 CLINICAL DATA:  Syncope with fall and back pain EXAM: CHEST  2 VIEW COMPARISON:  CT chest 06/28/2015, radiograph 04/16/2013 FINDINGS: Left CP angle is not  included on the frontal view. Surgical clips in the right axilla. No pleural effusion or pneumothorax. Borderline cardiomegaly. IMPRESSION: 1. Borderline cardiomegaly. 2. No acute infiltrate or edema. Electronically Signed   By: Donavan Foil M.D.   On: 07/03/2016 02:32   Dg Lumbar Spine Complete  Result Date: 07/03/2016 CLINICAL DATA:  Syncope, low back pain EXAM: LUMBAR SPINE - COMPLETE 4+ VIEW COMPARISON:  CT abdomen pelvis 03/28/2016 FINDINGS: 5 non rib-bearing lumbar type vertebra. The SI joints are symmetric. Lumbar alignment is within normal limits. Vertebral body heights are normal. Moderate degenerative disc changes at L4-L5 and L5-S1, mild to moderate changes at L1-L2 and L2-L3. Multilevel anterior osteophyte. Aortic atherosclerosis. IMPRESSION: Degenerative changes.  No definite acute osseous abnormality. Electronically Signed   By: Donavan Foil M.D.   On: 07/03/2016 02:31   Ct Head Wo Contrast  Result Date: 07/03/2016 CLINICAL DATA:  Syncope history of migraine with concussion EXAM: CT HEAD WITHOUT CONTRAST CT CERVICAL SPINE WITHOUT CONTRAST TECHNIQUE: Multidetector CT imaging of the head and cervical spine was performed following the standard protocol without intravenous contrast. Multiplanar CT image reconstructions of the cervical spine were also generated. COMPARISON:  09/21/2015, 12/07/2014, MRI a 11/19/2014 FINDINGS: CT HEAD FINDINGS Brain: No acute territorial infarction, hemorrhage or intracranial mass is visualized. There is mild atrophy. The ventricles are nonenlarged. Vascular: No hyperdense vessels. Scattered calcifications at the carotid siphons. Skull: No depressed skull fracture. Old fracture deformity of the right mandibular condyles. Sinuses/Orbits: Mucosal thickening in the maxillary sinuses with probable right antrostomy. Mucosal thickening in the ethmoid sinuses. No acute  orbital abnormality. Bilateral lens extraction Other: None CT CERVICAL SPINE FINDINGS Alignment: No  subluxation.  Facet alignment within normal limits. Skull base and vertebrae: No acute fracture. No primary bone lesion or focal pathologic process. Soft tissues and spinal canal: No prevertebral fluid or swelling. No visible canal hematoma. Disc levels: Mild to moderate degenerative disc changes at C5-C6, C6-C7 and C7-T1. Multilevel facet arthropathy. Upper chest: Lung apices clear.  Thyroid unremarkable. Other: None IMPRESSION: 1. No CT evidence for acute intracranial abnormality. 2. No acute fracture or malalignment of the cervical spine 3. Old right mandibular condyle fracture. Electronically Signed   By: Donavan Foil M.D.   On: 07/03/2016 03:19   Ct Cervical Spine Wo Contrast  Result Date: 07/03/2016 CLINICAL DATA:  Syncope history of migraine with concussion EXAM: CT HEAD WITHOUT CONTRAST CT CERVICAL SPINE WITHOUT CONTRAST TECHNIQUE: Multidetector CT imaging of the head and cervical spine was performed following the standard protocol without intravenous contrast. Multiplanar CT image reconstructions of the cervical spine were also generated. COMPARISON:  09/21/2015, 12/07/2014, MRI a 11/19/2014 FINDINGS: CT HEAD FINDINGS Brain: No acute territorial infarction, hemorrhage or intracranial mass is visualized. There is mild atrophy. The ventricles are nonenlarged. Vascular: No hyperdense vessels. Scattered calcifications at the carotid siphons. Skull: No depressed skull fracture. Old fracture deformity of the right mandibular condyles. Sinuses/Orbits: Mucosal thickening in the maxillary sinuses with probable right antrostomy. Mucosal thickening in the ethmoid sinuses. No acute orbital abnormality. Bilateral lens extraction Other: None CT CERVICAL SPINE FINDINGS Alignment: No subluxation.  Facet alignment within normal limits. Skull base and vertebrae: No acute fracture. No primary bone lesion or focal pathologic process. Soft tissues and spinal canal: No prevertebral fluid or swelling. No visible canal  hematoma. Disc levels: Mild to moderate degenerative disc changes at C5-C6, C6-C7 and C7-T1. Multilevel facet arthropathy. Upper chest: Lung apices clear.  Thyroid unremarkable. Other: None IMPRESSION: 1. No CT evidence for acute intracranial abnormality. 2. No acute fracture or malalignment of the cervical spine 3. Old right mandibular condyle fracture. Electronically Signed   By: Donavan Foil M.D.   On: 07/03/2016 03:19    Procedures Procedures (including critical care time)  Medications Ordered in ED Medications - No data to display   Initial Impression / Assessment and Plan / ED Course  I have reviewed the triage vital signs and the nursing notes.  Pertinent labs & imaging results that were available during my care of the patient were reviewed by me and considered in my medical decision making (see chart for details).    Patient from home with possible syncope. Patient does not know what happened. She was counting medications and the next thing she knows she was on the ground. Complains of low back pain which is chronic. Uncertain if she hit her head. She is on multiple sedating medications.  Patient was seen by her cardiologist Dr. Debara Pickett yesterday but was not forthcoming with this information. She's had a evaluation including loop recorder for episodes of recurrent syncope that have been thought to be due to possible seizure or polypharmacy. Cardiac workup has been negative.  EKG today is NSR. Traumatic imaging negative. intial hypotension improved with fluids. Labs reassuring.  Suspect syncope due to polypharmacy. No CP or SOB. No seizure activity or unilateral weakness.  Given lack of prodrome, concern for cardiogenic cause but patient has been extensively evaluated by cardiology.   Interrogate loop recorder. Observation admission dw Dr. Blaine Hamper.  Final Clinical Impressions(s) / ED Diagnoses   Final diagnoses:  Syncope and collapse  Tobacco abuse    New Prescriptions New  Prescriptions   No medications on file  I personally performed the services described in this documentation, which was scribed in my presence. The recorded information has been reviewed and is accurate.    Ezequiel Essex, MD 07/03/16 [phone]

## 2016-07-03 NOTE — ED Notes (Addendum)
Patient transported to XR. 

## 2016-07-03 NOTE — Progress Notes (Signed)
Patient states that she does not want to stay another night and feels fine enough to go home. MD Allyson Sabal has been paged about this and stated that patient can sign the AMA form if she wants to leave. RN will speak with patient.

## 2016-07-03 NOTE — Care Management Note (Signed)
Case Management Note  Patient Details  Name: Anne [name] MRN: [id number] Date of Birth: 11-Dec-1945  Subjective/Objective:    Syncope                Action/Plan: Discharge Planning: NCM spoke to pt at bedside. Was independent prior to hospital stay. She drives to her appts. No problems with getting meds.   PCP [facility], KIRSTEN MD  Expected Discharge Date:                Expected Discharge Plan:  Home/Self Care  In-House Referral:  NA  Discharge planning Services  CM Consult  Post Acute Care Choice:  NA Choice offered to:  NA  DME Arranged:  N/A DME Agency:  NA  HH Arranged:  NA HH Agency:  NA  Status of Service:  Completed, signed off  If discussed at [place] of Stay Meetings, dates discussed:    Additional Comments:  Erenest Rasher, RN 07/03/2016, 9:18 AM

## 2016-07-03 NOTE — ED Triage Notes (Signed)
Pt presents EMS from home after pts roomates caregiver found pt on kitchen floor. Per EMS pt c/o back pain and L arm pain where she has an older bruise. Pt easily falls asleep durning assessment and would not answer EMS questions about injuries. But is a & o x 4PTA

## 2016-07-03 NOTE — ED Notes (Signed)
Admitting provider at bedside.

## 2016-07-03 NOTE — Procedures (Signed)
ELECTROENCEPHALOGRAM REPORT  Date of Study: 07/03/2016  Patient's Name: Anne [name] MRN: [id number] Date of Birth: Oct 09, 1945  Referring Provider: Dr. Ivor Costa  Clinical History: This is a 71 year old woman with syncope.  Medications: acetaminophen (TYLENOL) tablet 650 mg  albuterol (PROVENTIL) (2.5 MG/3ML) 0.083% nebulizer solution 2.5 mg  aspirin EC tablet 81 mg  atorvastatin (LIPITOR) tablet 10 mg  enoxaparin (LOVENOX) injection 40 mg  fluticasone furoate-vilanterol (BREO ELLIPTA) 100-25 MCG/INH 1 puff  hydroxypropyl methylcellulose / hypromellose (ISOPTO TEARS / GONIOVISC) 2.5 % ophthalmic solution 1 drop  nicotine (NICODERM CQ - dosed in mg/24 hours) patch 21 mg  SUMAtriptan (IMITREX) tablet 100 mg  tamoxifen (NOLVADEX) tablet 20 mg  tiotropium (SPIRIVA) inhalation capsule 18 mcg  zolpidem (AMBIEN) tablet 5 mg   Technical Summary: A multichannel digital EEG recording measured by the international 10-20 system with electrodes applied with paste and impedances below 5000 ohms performed in our laboratory with EKG monitoring in an awake and asleep patient.  Hyperventilation was not performed, photic stimulation was performed.  The digital EEG was referentially recorded, reformatted, and digitally filtered in a variety of bipolar and referential montages for optimal display.    Description: The patient is awake and asleep during the recording.  During maximal wakefulness, there is a symmetric, medium voltage 9.5-10 Hz posterior dominant rhythm that attenuates with eye opening.  The record is symmetric.  During drowsiness and sleep, there is an increase in theta slowing of the background, with shifting asymmetry over the bilateral temporal regions.  Vertex waves and symmetric sleep spindles were seen.  Photic stimulation did not elicit any abnormalities.  There were no epileptiform discharges or electrographic seizures seen.    EKG lead showed sinus rhythm with occasional  extrasystolic beats.  Impression: This awake and asleep EEG is within normal limits.  Clinical Correlation: A normal EEG does not exclude a clinical diagnosis of epilepsy. Clinical correlation is advised.   Ellouise Newer, M.D.

## 2016-07-03 NOTE — H&P (Signed)
History and Physical    Anne [name] YPP:[id number] DOB: 10-31-1945 DOA: 07/03/2016  Referring MD/NP/PA:   PCP: [name], MD   Patient coming from:  The patient is coming from home.  At baseline, pt is independent for most of ADL.   Chief Complaint: syncope  HPI: Anne [name] is a 71 y.o. female with medical history significant of hyperlipidemia, COPD, asthma, depression, RLS, migraine headache, TIA, left breast cancer (s/p of mastectomy, on tamoxifen), s/p of loop recorder, tobacco abuse, who presents with syncope.  Per report,  the patient was found by her roommate lying on the floor following a syncopal episode this afternoon. Patient denies any prodromal symptoms. No unilateral weakness, slurred speech, blurry vision, chest pain, shortness rest, cough. No nausea, vomiting, diarrhea, symptoms of UTI. She has chronic back pain, which has not changed. No seizure history. No incontinence. She denies any alcohol consumption or drug use. Of note, pt was seen by her cardiologist, Dr. Debara Pickett in the morning. Per Dr. Lysbeth Penner clinic note, pt had another syncopal event which was captured on her implanted loop recorder and there is no evidence of arrhythmia or cardiac etiology to explain her syncope per Dr. Debara Pickett.   ED Course: pt was found to have hypotension with blood pressure 88/46 which improved to 126/65 after 2 L normal saline bolus. WBC 6.7, lactate of 0.76, troponin negative, Tylenol level less than 10, salicylate level less than 7, alcohol level less than 5, potassium 3.0, creatinine normal, oxygen saturation 97% on room air, negative chest x-ray, negative x-ray of her lumbar spine for acute issues. CT of head and CT of C-spine is negative for acute abnormalities. Patient is placed on telemetry bed for observation.  Review of Systems:   General: no fevers, chills, no changes in body weight, has fatigue HEENT: no blurry vision, hearing changes or sore throat Respiratory: no dyspnea,  coughing, wheezing CV: no chest pain, no palpitations GI: no nausea, vomiting, abdominal pain, diarrhea, constipation GU: no dysuria, burning on urination, increased urinary frequency, hematuria  Ext: has mild leg edema Neuro: no unilateral weakness, numbness, or tingling, no vision change or hearing loss. Has syncope. Skin: no rash, no skin tear. MSK: No muscle spasm, no deformity, no limitation of range of movement in spin Heme: No easy bruising.  Travel history: No recent long distant travel.  Allergy:  Allergies  Allergen Reactions  . Rocephin [Ceftriaxone Sodium In Dextrose] Shortness Of Breath and Rash  . Clindamycin/Lincomycin Nausea And Vomiting  . Gabapentin Other (See Comments)    Personality Changes  . Lyrica [Pregabalin] Other (See Comments)    PERSONALITY CHANGE    Past Medical History:  Diagnosis Date  . Asthma    daily inhalers  . Breast cancer ([place]) 05/2015   left  . COPD (chronic obstructive pulmonary disease) ([place])    denies SOB with ADLs; no O2  . Dental crowns present   . Depression   . History of concussion 01/01/2014  . History of kidney stones   . History of TIA (transient ischemic attack) 10/2014  . Hyperlipidemia   . Insomnia   . Migraine   . Nasal congestion 05/26/2015   will finish z-pak 05/27/2015  . Nonproductive cough 05/26/2015  . PONV (postoperative nausea and vomiting)    "long time ago"  none recently  . RLS (restless legs syndrome)   . Tobacco abuse   . Vitamin D deficiency   . Wears dentures    lower  . Wears partial dentures  upper    Past Surgical History:  Procedure Laterality Date  . ABDOMINAL HYSTERECTOMY  2000   complete  . APPENDECTOMY  2000  . BREAST BIOPSY Left   . BREAST RECONSTRUCTION WITH PLACEMENT OF TISSUE EXPANDER AND FLEX HD (ACELLULAR HYDRATED DERMIS) Bilateral 06/02/2015   Procedure: BILATERAL BREAST RECONSTRUCTION WITH PLACEMENT OF TISSUE EXPANDER AND  ACELLULAR HYDRATED DERMIS;  Surgeon: Irene Limbo,  MD;  Location: [facility];  Service: Plastics;  Laterality: Bilateral;  . CARPAL TUNNEL RELEASE Bilateral   . CATARACT EXTRACTION Bilateral 09/2013  . KNEE ARTHROSCOPY Left   . LOOP RECORDER INSERTION N/A 05/11/2016   Procedure: Loop Recorder Insertion;  Surgeon: Will Meredith Leeds, MD;  Location: [facility];  Service: Cardiovascular;  Laterality: N/A;  . MASTECTOMY W/ SENTINEL NODE BIOPSY Bilateral 06/02/2015   Procedure: BILATERAL MASTECTOMY WITH LEFT SENTINEL LYMPH NODE BIOPSY;  Surgeon: Stark Klein, MD;  Location: [facility];  Service: General;  Laterality: Bilateral;  . NASAL SEPTUM SURGERY     x 3  . PLANTAR FASCIA SURGERY Left   . REMOVAL OF BILATERAL TISSUE EXPANDERS WITH PLACEMENT OF BILATERAL BREAST IMPLANTS Bilateral 06/17/2015   Procedure: DEBRIDEMENT OF BILATERAL MASTECTOMY FLAP WITH BILATERAL TISSUE EXPANDER EXCHANGE ;  Surgeon: Irene Limbo, MD;  Location: [facility];  Service: Plastics;  Laterality: Bilateral;  . REMOVAL OF TISSUE EXPANDER Bilateral 07/12/2015   Procedure: REMOVAL OF BILATERAL TISSUE EXPANDERS;  Surgeon: Irene Limbo, MD;  Location: [facility];  Service: Plastics;  Laterality: Bilateral;    Social History:  reports that she has been smoking Cigarettes.  She has been smoking about 0.25 packs per day. She has never used smokeless tobacco. She reports that she drinks alcohol. She reports that she does not use drugs.  Family History:  Family History  Problem Relation Age of Onset  . Cervical cancer Mother 18  . Colon cancer Maternal Grandfather     mets to stomach; dx. 67-70  . Heart disease Father   . Prostate cancer Father 28  . Cervical cancer Maternal Grandmother     dx. 22s; treated with radium implant  . Lung cancer Sister 66    maternal half-sister dx. lung cancer, stage III  . Breast cancer Maternal Aunt   . Cervical cancer Sister 35    paternal half-sister; s/p TAH  . Spina bifida Grandchild     . Brain cancer Grandchild     grandson dx. at 20 mos, treated at [facility]  . Breast cancer Other 86    maternal half-sister's daughter  . Cancer Maternal Aunt     d. 24; unspecified type - "began at back area and moved to vital organs"  . Cancer Maternal Uncle      late 1s; unspecified type; "started at back and moved to vital organs"     Prior to Admission medications   Medication Sig Start Date End Date Taking? Authorizing Provider  Aclidinium Bromide (TUDORZA PRESSAIR) 400 MCG/ACT AEPB Inhale 1 puff into the lungs daily as needed (for respiratory issues.).     Historical Provider, MD  aspirin EC 81 MG tablet Take 81 mg by mouth every other day.     Historical Provider, MD  atorvastatin (LIPITOR) 10 MG tablet Take 10 mg by mouth at bedtime.     Historical Provider, MD  BREO ELLIPTA 100-25 MCG/INH AEPB Inhale 1 puff into the lungs daily as needed (for respiratory issues).  09/15/14   Historical Provider, MD  Cholecalciferol (  VITAMIN D3) 50000 UNITS CAPS Take 50,000 Units by mouth every 30 (thirty) days.     Historical Provider, MD  furosemide (LASIX) 20 MG tablet Take 20 mg by mouth daily as needed for edema (for leg swelling).     Historical Provider, MD  HYDROcodone-acetaminophen (NORCO/VICODIN) 5-325 MG tablet Take 1 tablet by mouth every 4 (four) hours as needed (for pain.).  02/05/14   Historical Provider, MD  hydrOXYzine (ATARAX/VISTARIL) 50 MG tablet Take 50 mg by mouth at bedtime.     Historical Provider, MD  mirtazapine (REMERON) 30 MG tablet Take 30 mg by mouth at bedtime.    Historical Provider, MD  PARoxetine (PAXIL) 40 MG tablet Take 40 mg by mouth at bedtime.     Historical Provider, MD  Polyethyl Glycol-Propyl Glycol (SYSTANE ULTRA) 0.4-0.3 % SOLN Place 1 drop into both eyes daily as needed (for dry eyes).     Historical Provider, MD  pramipexole (MIRAPEX) 1 MG tablet Take 1 mg by mouth at bedtime.    Historical Provider, MD  promethazine (PHENERGAN) 25 MG tablet Take 25 mg by  mouth every 6 (six) hours as needed for nausea or vomiting.    Historical Provider, MD  SUMAtriptan (IMITREX) 100 MG tablet Take 100 mg by mouth daily as needed for migraine (100 mg and then may repeat in 2 hours if headache persists).  11/25/13   Historical Provider, MD  tamoxifen (NOLVADEX) 20 MG tablet Take 20 mg by mouth at bedtime.     Historical Provider, MD  temazepam (RESTORIL) 30 MG capsule  06/25/16   Historical Provider, MD  topiramate (TOPAMAX) 50 MG tablet Take 50 mg by mouth at bedtime. 03/27/16   Historical Provider, MD  traZODone (DESYREL) 100 MG tablet Take 50 mg by mouth at bedtime.  01/26/14   Historical Provider, MD  VENTOLIN HFA 108 (90 BASE) MCG/ACT inhaler Inhale 1 puff into the lungs every 6 (six) hours as needed for wheezing or shortness of breath.  09/29/14   Historical Provider, MD    Physical Exam: Vitals:   07/03/16 0115 07/03/16 0238 07/03/16 0315 07/03/16 0400  BP: (!) 88/46 140/70 130/65 (!) 142/80  Pulse: 65 81 70 83  Resp: 16 (!) 21 20 19   SpO2: 98% 99% 99% 100%  Weight:      Height:       General: Not in acute distress. Dry mucus and membrane HEENT:       Eyes: PERRL, EOMI, no scleral icterus.       ENT: No discharge from the ears and nose, no pharynx injection, no tonsillar enlargement.        Neck: No JVD, no bruit, no mass felt. Heme: No neck lymph node enlargement. Cardiac: S1/S2, RRR, No murmurs, No gallops or rubs. Respiratory:  No rales, wheezing, rhonchi or rubs. GI: Soft, nondistended, nontender, no rebound pain, no organomegaly, BS present. GU: No hematuria Ext: traced leg edema bilaterally. 2+DP/PT pulse bilaterally. Musculoskeletal: No joint deformities, No joint redness or warmth, no limitation of ROM in spin. Skin: No rashes.  Neuro: Alert, oriented X3, cranial nerves II-XII grossly intact, moves all extremities normally. Muscle strength 5/5 in all extremities, sensation to light touch intact. Brachial reflex 2+ bilaterally. Negative  Babinski's sign. Normal finger to nose test. Psych: Patient is not psychotic, no suicidal or hemocidal ideation.  Labs on Admission: I have personally reviewed following labs and imaging studies  CBC:  Recent Labs Lab 07/03/16 0122  WBC 6.7  NEUTROABS 3.6  HGB 11.1*  HCT 35.1*  MCV 90.7  PLT 814   Basic Metabolic Panel:  Recent Labs Lab 07/03/16 0122  NA 141  K 3.0*  CL 114*  CO2 22  GLUCOSE 89  BUN 16  CREATININE 0.85  CALCIUM 8.1*   GFR: Estimated Creatinine Clearance: 47.1 mL/min (by C-G formula based on SCr of 0.85 mg/dL). Liver Function Tests:  Recent Labs Lab 07/03/16 0122  AST 23  ALT 18  ALKPHOS 59  BILITOT 0.4  PROT 5.1*  ALBUMIN 2.9*   No results for input(s): LIPASE, AMYLASE in the last 168 hours. No results for input(s): AMMONIA in the last 168 hours. Coagulation Profile: No results for input(s): INR, PROTIME in the last 168 hours. Cardiac Enzymes:  Recent Labs Lab 07/03/16 0122  TROPONINI <0.03   BNP (last 3 results) No results for input(s): PROBNP in the last 8760 hours. HbA1C: No results for input(s): HGBA1C in the last 72 hours. CBG: No results for input(s): GLUCAP in the last 168 hours. Lipid Profile: No results for input(s): CHOL, HDL, LDLCALC, TRIG, CHOLHDL, LDLDIRECT in the last 72 hours. Thyroid Function Tests: No results for input(s): TSH, T4TOTAL, FREET4, T3FREE, THYROIDAB in the last 72 hours. Anemia Panel: No results for input(s): VITAMINB12, FOLATE, FERRITIN, TIBC, IRON, RETICCTPCT in the last 72 hours. Urine analysis: No results found for: COLORURINE, APPEARANCEUR, LABSPEC, PHURINE, GLUCOSEU, HGBUR, BILIRUBINUR, KETONESUR, PROTEINUR, UROBILINOGEN, NITRITE, LEUKOCYTESUR Sepsis Labs: @LABRCNTIP (procalcitonin:4,lacticidven:4) )No results found for this or any previous visit (from the past 240 hour(s)).   Radiological Exams on Admission: Dg Chest 2 View  Result Date: 07/03/2016 CLINICAL DATA:  Syncope with fall and  back pain EXAM: CHEST  2 VIEW COMPARISON:  CT chest 06/28/2015, radiograph 04/16/2013 FINDINGS: Left CP angle is not included on the frontal view. Surgical clips in the right axilla. No pleural effusion or pneumothorax. Borderline cardiomegaly. IMPRESSION: 1. Borderline cardiomegaly. 2. No acute infiltrate or edema. Electronically Signed   By: Donavan Foil M.D.   On: 07/03/2016 02:32   Dg Lumbar Spine Complete  Result Date: 07/03/2016 CLINICAL DATA:  Syncope, low back pain EXAM: LUMBAR SPINE - COMPLETE 4+ VIEW COMPARISON:  CT abdomen pelvis 03/28/2016 FINDINGS: 5 non rib-bearing lumbar type vertebra. The SI joints are symmetric. Lumbar alignment is within normal limits. Vertebral body heights are normal. Moderate degenerative disc changes at L4-L5 and L5-S1, mild to moderate changes at L1-L2 and L2-L3. Multilevel anterior osteophyte. Aortic atherosclerosis. IMPRESSION: Degenerative changes.  No definite acute osseous abnormality. Electronically Signed   By: Donavan Foil M.D.   On: 07/03/2016 02:31   Ct Head Wo Contrast  Result Date: 07/03/2016 CLINICAL DATA:  Syncope history of migraine with concussion EXAM: CT HEAD WITHOUT CONTRAST CT CERVICAL SPINE WITHOUT CONTRAST TECHNIQUE: Multidetector CT imaging of the head and cervical spine was performed following the standard protocol without intravenous contrast. Multiplanar CT image reconstructions of the cervical spine were also generated. COMPARISON:  09/21/2015, 12/07/2014, MRI a 11/19/2014 FINDINGS: CT HEAD FINDINGS Brain: No acute territorial infarction, hemorrhage or intracranial mass is visualized. There is mild atrophy. The ventricles are nonenlarged. Vascular: No hyperdense vessels. Scattered calcifications at the carotid siphons. Skull: No depressed skull fracture. Old fracture deformity of the right mandibular condyles. Sinuses/Orbits: Mucosal thickening in the maxillary sinuses with probable right antrostomy. Mucosal thickening in the ethmoid sinuses.  No acute orbital abnormality. Bilateral lens extraction Other: None CT CERVICAL SPINE FINDINGS Alignment: No subluxation.  Facet alignment within normal limits. Skull base and vertebrae: No acute  fracture. No primary bone lesion or focal pathologic process. Soft tissues and spinal canal: No prevertebral fluid or swelling. No visible canal hematoma. Disc levels: Mild to moderate degenerative disc changes at C5-C6, C6-C7 and C7-T1. Multilevel facet arthropathy. Upper chest: Lung apices clear.  Thyroid unremarkable. Other: None IMPRESSION: 1. No CT evidence for acute intracranial abnormality. 2. No acute fracture or malalignment of the cervical spine 3. Old right mandibular condyle fracture. Electronically Signed   By: Donavan Foil M.D.   On: 07/03/2016 03:19   Ct Cervical Spine Wo Contrast  Result Date: 07/03/2016 CLINICAL DATA:  Syncope history of migraine with concussion EXAM: CT HEAD WITHOUT CONTRAST CT CERVICAL SPINE WITHOUT CONTRAST TECHNIQUE: Multidetector CT imaging of the head and cervical spine was performed following the standard protocol without intravenous contrast. Multiplanar CT image reconstructions of the cervical spine were also generated. COMPARISON:  09/21/2015, 12/07/2014, MRI a 11/19/2014 FINDINGS: CT HEAD FINDINGS Brain: No acute territorial infarction, hemorrhage or intracranial mass is visualized. There is mild atrophy. The ventricles are nonenlarged. Vascular: No hyperdense vessels. Scattered calcifications at the carotid siphons. Skull: No depressed skull fracture. Old fracture deformity of the right mandibular condyles. Sinuses/Orbits: Mucosal thickening in the maxillary sinuses with probable right antrostomy. Mucosal thickening in the ethmoid sinuses. No acute orbital abnormality. Bilateral lens extraction Other: None CT CERVICAL SPINE FINDINGS Alignment: No subluxation.  Facet alignment within normal limits. Skull base and vertebrae: No acute fracture. No primary bone lesion or focal  pathologic process. Soft tissues and spinal canal: No prevertebral fluid or swelling. No visible canal hematoma. Disc levels: Mild to moderate degenerative disc changes at C5-C6, C6-C7 and C7-T1. Multilevel facet arthropathy. Upper chest: Lung apices clear.  Thyroid unremarkable. Other: None IMPRESSION: 1. No CT evidence for acute intracranial abnormality. 2. No acute fracture or malalignment of the cervical spine 3. Old right mandibular condyle fracture. Electronically Signed   By: Donavan Foil M.D.   On: 07/03/2016 03:19     EKG: Independently reviewed. Sinus rhythm, QTC 475, low voltage.  Assessment/Plan Principal Problem:   Syncope and collapse Active Problems:   Breast cancer, left ([facility])   Syncope   Hypokalemia   Hypotension   COPD (chronic obstructive pulmonary disease) ([facility])   HLD (hyperlipidemia)   Tobacco abuse   Syncope and collapse: Etiology is not clear. Patient had a loop recorder placed on 05/11/16. Per Dr. Nona Dell clinic note,  there is no evidence of arrhythmia or cardiac etiology to explain her syncope. Likely due to multifocal factor etiology, including hypotension, dehydration, polypharmacy (patient is taking multiple sedative medications). Other differential diagnoses including TIA and seizure. Pt had carotid Doppler on 05/17/16 which is not impressive (1-39% bilateral ICA stenosis). Pt has lower recorder and cannot do MRI of brain.  - Please on tele bed for obs - Orthostatic vital signs  - 2d echo - Neuro checks  - EEG - PT/OT eval and treat - IVF for hypotension -Hold sedative medications: hydroxyzine, Remeron, Paxil, trazodone, pramipexole, Phenergan, EOMI, Restoril -Frequent neuro check  Hypotension: etiology is not clear. Patient does not seem to have infection. Likely due to dehydration. Patient is clinically dry on physical examination. -IV fluids: 2 L normal saline, followed by 100 mL per hour -Hold Lasix  Breast cancer, left [facility]): s/p of left  mastectomy. -continue tamoxifen  Hypokalemia: K= 3.0 on admission. - Repleted - Check Mg level  COPD: stable -When necessary albuterol nebulizers, -Breo Ellipta inhaler and spiriva inhaler  HLD: -lipitor  Tobacco abuse: -Did  counseling about importance of quitting smoking -Nicotine patch   DVT ppx: SQ Lovenox Code Status: Full code Family Communication: None at bed side.    Disposition Plan:  Anticipate discharge back to previous home environment Consults called:  none Admission status: Obs / tele    Date of Service 07/03/2016    Ivor Costa [facility] Pager [phone]  If 7PM-7AM, please contact night-coverage [email or web address] Password [email or web address] 07/03/2016, 4:46 AM

## 2016-07-03 NOTE — Progress Notes (Signed)
Occupational Therapy Evaluation Patient Details Name: Anne [name] MRN: [id number] DOB: Mar 09, 1945 Today's Date: 07/03/2016    History of Present Illness 71 y.o. female with medical history significant of hyperlipidemia, COPD, asthma, depression, RLS, migraine headache, TIA, left breast cancer (s/p of mastectomy, on tamoxifen), s/p of loop recorder, tobacco abuse, who presents with syncope   Clinical Impression   Pt at baseline with ADL and mobility. BS:WHQPRF 112/54; sitting 98/60; standing 118/98. Pt discussed her current living situation appears to have multiple stressors in her life. 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 There also appear to be dynamics with the caregiver's family, adding another stressor to the patient's situation. Pt became tearful stating that she "will not just leave her client because she knows she needs her". Pt also discussed how she was on multiple medications which she knows affects her. Discussed need to find a balance regarding her "work duties" and assuring that she had adequate sleep/rest and nutritional intake. 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 Pt appreciative. Pt safe to DC home when medically stable. OT signing off.     Follow Up Recommendations  No OT follow up;Supervision - Intermittent    Equipment Recommendations  None recommended by OT    Recommendations for Other Services Other (comment) (Nutritional consult; Social work to provide resources for counseling)     Precautions / Restrictions Precautions Precautions: None Restrictions Weight Bearing Restrictions: No       Mobility Bed Mobility Overal bed mobility: Independent                Transfers Overall transfer level: Independent                    Balance Overall balance assessment: Independent                               Standardized Balance Assessment Standardized Balance Assessment : Dynamic Gait Index   Dynamic Gait Index Level Surface: Normal Change in Gait Speed: Normal Gait with Horizontal Head Turns: Normal Gait with Vertical Head Turns: Normal Gait and Pivot Turn: Normal Step Over Obstacle: Normal Step Around Obstacles: Normal Steps: Normal Total Score: 24     ADL either performed or assessed with clinical judgement   ADL Overall ADL's : At baseline            Recommend pt set a timer on phone/watch to remind her to drink water. Discussed stressors in her life and pt became tearful, stating she feels like she can't leave her client, but knows that she is "running herself down". States "I know this is my body taking to me" Recommended pt follow up with counseling to discuss current stressors and develop coping strategies to improve her health. Also discussed importance of proper sleep and nutritional intake.                                 Vision Baseline Vision/History: Wears glasses       Perception  Praxis      Pertinent Vitals/Pain Pain Assessment: No/denies pain     Hand Dominance Right   Extremity/Trunk Assessment Upper Extremity Assessment Upper Extremity Assessment: Overall WFL for tasks assessed   Lower Extremity Assessment Lower Extremity Assessment: Overall WFL for tasks assessed   Cervical / Trunk Assessment Cervical / Trunk Assessment: Normal   Communication Communication Communication: No difficulties   Cognition Arousal/Alertness: Awake/alert Behavior During Therapy: WFL for tasks assessed/performed Overall Cognitive Status: Within Functional Limits for tasks assessed                                      General Comments       Exercises     Shoulder Instructions      Home Living Family/patient expects to be discharged to:: Private residence Living Arrangements: Alone;Non-relatives/Friends Available Help at Discharge: Friend(s);Available PRN/intermittently;Family Type of Home: House Home Access: Stairs to enter [employer] of Steps: 2 Entrance Stairs-Rails: Can reach both Home Layout: One level     Bathroom Shower/Tub: Tub/shower unit;Walk-in shower   Bathroom Toilet: Handicapped height Bathroom Accessibility: Yes How Accessible: Accessible via walker Home Equipment: None          Prior Functioning/Environment Level of Independence: Independent        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        OT Problem List: Decreased activity tolerance      OT Treatment/Interventions:      OT Goals(Current goals can be found in the care plan section) Acute Rehab OT Goals Patient Stated Goal: to take better care of herself OT Goal Formulation: All assessment and education complete, DC therapy  OT Frequency:     Barriers to D/C:            Co-evaluation              AM-PAC PT "6 Clicks" Daily Activity     Outcome Measure Help from another person eating meals?: None Help from another person taking care of personal grooming?: None Help from another person toileting, which includes using toliet, bedpan, or urinal?: None Help from another person bathing (including washing, rinsing, drying)?: None Help from another person to put on and taking off regular upper body clothing?: None Help from another person to put on and taking off regular lower body clothing?: None 6 Click Score: 24   End of Session Equipment Utilized During Treatment: Gait belt Nurse Communication: Mobility status;Other (comment) (Pt stress level)  Activity Tolerance: Patient tolerated treatment well Patient left: in bed;with call bell/phone  within reach;with nursing/sitter in room  OT Visit Diagnosis: Unsteadiness on feet (R26.81)                Time: 6967-8938 OT Time Calculation (min): 35 min Charges:  OT General Charges $OT Visit: 1 Procedure OT Evaluation $OT Eval Low Complexity: 1 Procedure OT Treatments $Self Care/Home Management : 8-22 mins G-Codes: OT G-codes **NOT FOR INPATIENT CLASS** Functional Assessment Tool Used: Clinical judgement Functional Limitation: Self care Self Care Current Status (B0175): 0 percent impaired, limited or restricted Self Care Goal Status (Z0258): 0 percent impaired, limited or restricted Self Care Discharge Status (N2778): 0 percent impaired, limited or restricted   [facility], OT/L  [phone] 07/03/2016  Etheline Geppert,HILLARY 07/03/2016, 12:47 PM

## 2016-07-03 NOTE — Progress Notes (Signed)
PT Cancellation/Discharge Note  Patient Details Name: Anne [name] MRN: [id number] DOB: 03/25/1945   Cancelled Treatment:    Reason Eval/Treat Not Completed: PT screened, no needs identified, will sign off. Per chart/OT note, patient is currently at her baseline functional level.  Independent with mobility and gait.  Scored 24/24 on DGI balance assessment per OT note.  No acute PT needs identified - PT will sign off.   [name] 07/03/2016, 1:50 PM [name] [name], [name] Pager [phone]

## 2016-07-03 NOTE — Progress Notes (Signed)
New Admission Note:   Arrival Method: From ED via stretcher Mental Orientation: A&Ox4 Telemetry: box 2w34 Assessment: Completed Skin: Intact bruise on L arm IV: L AC Pain: denies any pain Tubes: None Safety Measures: Safety Fall Prevention Plan has been discussed  Admission 2 West Orientation: Patient has been orientated to the room, unit and staff.  Family: none present at bedside  Orders to be reviewed and implemented. Will continue to monitor the patient. Call light has been placed within reach and bed alarm has been activated. Tele box applied applied. CCMD notified.    Isac Caddy, RN

## 2016-07-04 ENCOUNTER — Inpatient Hospital Stay (HOSPITAL_COMMUNITY): Payer: Medicare Other

## 2016-07-04 ENCOUNTER — Other Ambulatory Visit (HOSPITAL_COMMUNITY): Payer: Medicare Other

## 2016-07-04 DIAGNOSIS — I952 Hypotension due to drugs: Secondary | ICD-10-CM | POA: Diagnosis not present

## 2016-07-04 DIAGNOSIS — R55 Syncope and collapse: Secondary | ICD-10-CM

## 2016-07-04 DIAGNOSIS — J449 Chronic obstructive pulmonary disease, unspecified: Secondary | ICD-10-CM | POA: Diagnosis not present

## 2016-07-04 DIAGNOSIS — G8929 Other chronic pain: Secondary | ICD-10-CM | POA: Diagnosis not present

## 2016-07-04 DIAGNOSIS — J411 Mucopurulent chronic bronchitis: Secondary | ICD-10-CM | POA: Diagnosis not present

## 2016-07-04 DIAGNOSIS — F17210 Nicotine dependence, cigarettes, uncomplicated: Secondary | ICD-10-CM | POA: Diagnosis not present

## 2016-07-04 DIAGNOSIS — I10 Essential (primary) hypertension: Secondary | ICD-10-CM | POA: Diagnosis not present

## 2016-07-04 LAB — ECHOCARDIOGRAM COMPLETE
HEIGHTINCHES: 61 in
Weight: 1844.8 oz

## 2016-07-04 NOTE — Consult Note (Signed)
           [facility] CM Primary Care Navigator  07/04/2016  Anne [name] 05-16-1945 [id number]   Went to see patient in the roomto identify possible discharge needs but she was alreadydischarged per RN .  RN reports that patient was discharged home "in a hurry" this morning after 2D echo (patient had been wanting to go home since yesterday) without knowing the results of her tests.  Primary care provider's office called ([place]) to notify of patient's discharge and need for post hospital follow-up and transition of care. Notified also of patient's health issues needing follow-up.  Made aware to refer patient to [facility] care management ifdeemed appropriatefor services.                                                                                                                   For questions, please contact:  Dannielle Huh, BSN, RN- [facility] Primary Care Navigator  Telephone: [phone] [place]

## 2016-07-04 NOTE — Progress Notes (Signed)
  Echocardiogram 2D Echocardiogram has been performed.  Anne [name] 07/04/2016, 9:22 AM

## 2016-07-05 DIAGNOSIS — H2513 Age-related nuclear cataract, bilateral: Secondary | ICD-10-CM | POA: Diagnosis not present

## 2016-07-05 DIAGNOSIS — H35363 Drusen (degenerative) of macula, bilateral: Secondary | ICD-10-CM | POA: Diagnosis not present

## 2016-07-05 DIAGNOSIS — H43811 Vitreous degeneration, right eye: Secondary | ICD-10-CM | POA: Diagnosis not present

## 2016-07-10 ENCOUNTER — Ambulatory Visit (INDEPENDENT_AMBULATORY_CARE_PROVIDER_SITE_OTHER): Payer: Medicare Other | Admitting: *Deleted

## 2016-07-10 DIAGNOSIS — R55 Syncope and collapse: Secondary | ICD-10-CM

## 2016-07-10 DIAGNOSIS — F321 Major depressive disorder, single episode, moderate: Secondary | ICD-10-CM | POA: Diagnosis not present

## 2016-07-10 DIAGNOSIS — G43109 Migraine with aura, not intractable, without status migrainosus: Secondary | ICD-10-CM | POA: Diagnosis not present

## 2016-07-11 NOTE — Progress Notes (Signed)
Carelink Summary Report / Loop Recorder 

## 2016-07-18 ENCOUNTER — Telehealth: Payer: Self-pay | Admitting: Cardiology

## 2016-07-18 DIAGNOSIS — Z853 Personal history of malignant neoplasm of breast: Secondary | ICD-10-CM | POA: Diagnosis not present

## 2016-07-18 DIAGNOSIS — F329 Major depressive disorder, single episode, unspecified: Secondary | ICD-10-CM | POA: Diagnosis not present

## 2016-07-18 NOTE — Telephone Encounter (Signed)
LMOVM requesting that pt send manual transmission b/c home monitor has not updated in at least 14 days.    

## 2016-07-20 LAB — CUP PACEART REMOTE DEVICE CHECK
Implantable Pulse Generator Implant Date: 20180309
MDC IDC SESS DTM: 20180508204339

## 2016-08-07 DIAGNOSIS — F17200 Nicotine dependence, unspecified, uncomplicated: Secondary | ICD-10-CM | POA: Diagnosis not present

## 2016-08-07 DIAGNOSIS — R7301 Impaired fasting glucose: Secondary | ICD-10-CM | POA: Diagnosis not present

## 2016-08-07 DIAGNOSIS — R0789 Other chest pain: Secondary | ICD-10-CM | POA: Diagnosis not present

## 2016-08-07 DIAGNOSIS — I10 Essential (primary) hypertension: Secondary | ICD-10-CM | POA: Diagnosis not present

## 2016-08-07 DIAGNOSIS — R0781 Pleurodynia: Secondary | ICD-10-CM | POA: Diagnosis not present

## 2016-08-07 DIAGNOSIS — F321 Major depressive disorder, single episode, moderate: Secondary | ICD-10-CM | POA: Diagnosis not present

## 2016-08-07 DIAGNOSIS — E782 Mixed hyperlipidemia: Secondary | ICD-10-CM | POA: Diagnosis not present

## 2016-08-09 ENCOUNTER — Ambulatory Visit (INDEPENDENT_AMBULATORY_CARE_PROVIDER_SITE_OTHER): Payer: Medicare Other | Admitting: *Deleted

## 2016-08-09 DIAGNOSIS — R55 Syncope and collapse: Secondary | ICD-10-CM | POA: Diagnosis not present

## 2016-08-09 NOTE — Progress Notes (Signed)
Electrophysiology Office Note   Date:  08/13/2016   ID:  Anne [name], DOB 1945/06/30, MRN [id number]  PCP:  [name], MD  Cardiologist:  Debara Pickett Primary Electrophysiologist:  Kais Monje Meredith [place], MD    Chief Complaint  Patient presents with  . Pacemaker Check    Loop recorder/syncope     History of Present Illness: Anne [name] is a 71 y.o. female who is being seen today for the evaluation of syncope at the request of Cox, Kirsten, MD. Presenting today for electrophysiology evaluation. She has a history of breast cancer, diagnosed in March 2017 status post bilateral mastectomy. She did not require chemotherapy or radiation. 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 There is not an associated prodrome. She fell twice at work hitting head on the side of the table. She had a Linq monitor implanted on 05/11/17. She presented to the hospital on 07/04/16 after syncope. It was thought that this was due to polypharmacy.    Today, she denies symptoms of palpitations, chest pain, shortness of breath, orthopnea, PND, lower extremity edema, claudication, dizziness, presyncope, syncope, bleeding, or neurologic sequela. The patient is tolerating medications without difficulties.  She feels that her symptoms are mainly due to stressors. She has not had an episode of syncope in the hospital.   Past Medical History:  Diagnosis Date  . Asthma    daily inhalers  . Breast cancer ([place]) 05/2015   left  . COPD (chronic obstructive pulmonary disease) ([place])    denies SOB with ADLs; no O2  . Dental crowns present   . Depression   . History of concussion 01/01/2014  . History of kidney stones   . History of TIA (transient ischemic attack) 10/2014  . Hyperlipidemia   . Insomnia   . Migraine   . Nasal congestion 05/26/2015   Nethaniel Mattie finish z-pak 05/27/2015  . Nonproductive cough 05/26/2015  . PONV (postoperative nausea and vomiting)    "long time ago"  none recently  . RLS (restless legs syndrome)     . Tobacco abuse   . Vitamin D deficiency   . Wears dentures    lower  . Wears partial dentures    upper   Past Surgical History:  Procedure Laterality Date  . ABDOMINAL HYSTERECTOMY  2000   complete  . APPENDECTOMY  2000  . BREAST BIOPSY Left   . BREAST RECONSTRUCTION WITH PLACEMENT OF TISSUE EXPANDER AND FLEX HD (ACELLULAR HYDRATED DERMIS) Bilateral 06/02/2015   Procedure: BILATERAL BREAST RECONSTRUCTION WITH PLACEMENT OF TISSUE EXPANDER AND  ACELLULAR HYDRATED DERMIS;  Surgeon: Irene Limbo, MD;  Location: [place] [facility];  Service: Plastics;  Laterality: Bilateral;  . CARPAL TUNNEL RELEASE Bilateral   . CATARACT EXTRACTION Bilateral 09/2013  . KNEE ARTHROSCOPY Left   . LOOP RECORDER INSERTION N/A 05/11/2016   Procedure: Loop Recorder Insertion;  Surgeon: Makenli Derstine Meredith [facility], MD;  Location: [facility];  Service: Cardiovascular;  Laterality: N/A;  . MASTECTOMY W/ SENTINEL NODE BIOPSY Bilateral 06/02/2015   Procedure: BILATERAL MASTECTOMY WITH LEFT SENTINEL LYMPH NODE BIOPSY;  Surgeon: Stark [facility], MD;  Location: [place];  Service: General;  Laterality: Bilateral;  . NASAL SEPTUM SURGERY     x 3  . PLANTAR FASCIA SURGERY Left   . REMOVAL OF BILATERAL TISSUE EXPANDERS WITH PLACEMENT OF BILATERAL BREAST IMPLANTS Bilateral 06/17/2015   Procedure: DEBRIDEMENT OF BILATERAL MASTECTOMY FLAP WITH BILATERAL TISSUE EXPANDER EXCHANGE ;  Surgeon: Irene Limbo, MD;  [facility];  Service: Clinical cytogeneticist;  Laterality: Bilateral;  . REMOVAL OF TISSUE EXPANDER Bilateral 07/12/2015   Procedure: REMOVAL OF BILATERAL TISSUE EXPANDERS;  Surgeon: Irene Limbo, MD;  Location: [facility];  Service: Plastics;  Laterality: Bilateral;     Current Outpatient Prescriptions  Medication Sig Dispense Refill  . atorvastatin (LIPITOR) 20 MG tablet Take 20 mg by mouth daily.    Marland Kitchen BREO ELLIPTA 100-25 MCG/INH AEPB Inhale 1 puff into the lungs daily as needed  (for respiratory issues).     . Cholecalciferol (VITAMIN D3) 50000 UNITS CAPS Take 50,000 Units by mouth every 30 (thirty) days.     . furosemide (LASIX) 20 MG tablet Take 20 mg by mouth daily as needed for edema (for leg swelling).     Marland Kitchen HYDROcodone-acetaminophen (NORCO/VICODIN) 5-325 MG tablet Take 1 tablet by mouth every 4 (four) hours as needed (for pain.).     Marland Kitchen hydrOXYzine (ATARAX/VISTARIL) 50 MG tablet Take 50 mg by mouth at bedtime.     . mirtazapine (REMERON) 45 MG tablet Take 45 mg by mouth daily.    . nicotine (NICODERM CQ - DOSED IN MG/24 HOURS) 21 mg/24hr patch Place 1 patch (21 mg total) onto the skin daily. 28 patch 0  . promethazine (PHENERGAN) 25 MG tablet Take 1 tablet by mouth as needed for nausea/vomiting.    . SUMAtriptan (IMITREX) 100 MG tablet Take 100 mg by mouth daily as needed for migraine (100 mg and then may repeat in 2 hours if headache persists).     . tamoxifen (NOLVADEX) 20 MG tablet Take 20 mg by mouth at bedtime.     . temazepam (RESTORIL) 30 MG capsule Take 30 mg by mouth at bedtime.     . topiramate (TOPAMAX) 50 MG tablet Take 50 mg by mouth at bedtime.    . VENTOLIN HFA 108 (90 BASE) MCG/ACT inhaler Inhale 1 puff into the lungs every 6 (six) hours as needed for wheezing or shortness of breath.      No current facility-administered medications for this visit.     Allergies:   Rocephin [ceftriaxone sodium in dextrose]; Clindamycin/lincomycin; Gabapentin; and Lyrica [pregabalin]   Social History:  The patient  reports that she has been smoking Cigarettes.  She has been smoking about 0.25 packs per day. She has never used smokeless tobacco. She reports that she drinks alcohol. She reports that she does not use drugs.   Family History:  The patient's family history includes Brain cancer in her grandchild; Breast cancer in her maternal aunt; Breast cancer (age of onset: 65) in her other; Cancer in her maternal aunt and maternal uncle; Cervical cancer in her  maternal grandmother; Cervical cancer (age of onset: 37) in her mother; Cervical cancer (age of onset: 8) in her sister; Colon cancer in her maternal grandfather; Heart disease in her father; Lung cancer (age of onset: 29) in her sister; Prostate cancer (age of onset: 60) in her father; Spina bifida in her grandchild.    ROS:  Please see the history of present illness.   Otherwise, review of systems is positive for leg swelling.   All other systems are reviewed and negative.    PHYSICAL EXAM: VS:  BP 120/62   Pulse 83   Ht 5\' 1"  (1.549 m)   Wt 113 lb (51.3 kg)   BMI 21.35 kg/m  , BMI Body mass index is 21.35 kg/m. GEN: Well nourished, well developed, in no acute distress  HEENT: normal  Neck: no  JVD, carotid bruits, or masses Cardiac: RRR; no murmurs, rubs, or gallops,no edema  Respiratory:  clear to auscultation bilaterally, normal work of breathing GI: soft, nontender, nondistended, + BS MS: no deformity or atrophy  Skin: warm and dry,  device pocket is well healed Neuro:  Strength and sensation are intact Psych: euthymic mood, full affect  EKG:  EKG is not ordered today. Personal review of the ekg ordered 07/03/16 shows sinus rhythm  Device interrogation is reviewed today in detail.  See PaceArt for details.   Recent Labs: 07/03/2016: ALT 18; BUN 11; Creatinine, Ser 0.76; Hemoglobin 11.9; Magnesium 1.9; Platelets 164; Potassium 3.9; Sodium 144    Lipid Panel  No results found for: CHOL, TRIG, HDL, CHOLHDL, VLDL, LDLCALC, LDLDIRECT   Wt Readings from Last 3 Encounters:  08/13/16 113 lb (51.3 kg)  07/03/16 115 lb 4.8 oz (52.3 kg)  07/02/16 115 lb 12.8 oz (52.5 kg)      Other studies Reviewed: Additional studies/ records that were reviewed today include: TTE 07/04/16  Review of the above records today demonstrates:  - Left ventricle: The cavity size was normal. Systolic function was   vigorous. The estimated ejection fraction was in the range of 65%   to 70%. Wall motion  was normal; there were no regional wall   motion abnormalities. Left ventricular diastolic function   parameters were normal. - Aortic valve: Cannot tell if valve is tri-leaflet or not. - Mitral valve: There was mild regurgitation. - Atrial septum: No defect or patent foramen ovale was identified. - Pulmonary arteries: PA peak pressure: 34 mm Hg (S).   ASSESSMENT AND PLAN:  1.  Syncope: s/p LINQ. Most recent episode thought due to polypharmacy. Has had no arrhythmias noted on LINQ monitor. Interrogation today shows no episodes of cardiorespiratory. No arrhythmia was noted. We'll continue monitoring for further causes of syncope. Of note she does say that she is out in a stressful situation and she feels that her syncope was due to polypharmacy and her stresses.    Current medicines are reviewed at length with the patient today.   The patient does not have concerns regarding her medicines.  The following changes were made today:  none  Labs/ tests ordered today include:  No orders of the defined types were placed in this encounter.    Disposition:   FU with Ary Rudnick PRN  Signed, Dimitri Dsouza Meredith [facility], MD  08/13/2016 12:29 PM     [facility] [address] [place] [address] [phone] (office) [phone] (fax)

## 2016-08-10 NOTE — Progress Notes (Signed)
Carelink Summary Report / Loop Recorder 

## 2016-08-13 ENCOUNTER — Ambulatory Visit (INDEPENDENT_AMBULATORY_CARE_PROVIDER_SITE_OTHER): Payer: Medicare Other | Admitting: Cardiology

## 2016-08-13 ENCOUNTER — Encounter: Payer: Self-pay | Admitting: Cardiology

## 2016-08-13 VITALS — BP 120/62 | HR 83 | Ht 61.0 in | Wt 113.0 lb

## 2016-08-13 DIAGNOSIS — R55 Syncope and collapse: Secondary | ICD-10-CM

## 2016-08-13 NOTE — Patient Instructions (Addendum)
Medication Instructions:  Your physician recommends that you continue on your current medications as directed. Please refer to the Current Medication list given to you today.  * If you need a refill on your cardiac medications before your next appointment, please call your pharmacy.   Labwork: None ordered  Testing/Procedures: None ordered  Follow-Up: No follow up is needed at this time with Dr. Camnitz.  He will see you on an as needed basis.   Thank you for choosing [facility]!!   Sherri Price, RN [phone]     

## 2016-08-20 LAB — CUP PACEART REMOTE DEVICE CHECK
Date Time Interrogation Session: 20180607203939
MDC IDC PG IMPLANT DT: 20180309

## 2016-08-20 NOTE — Progress Notes (Signed)
Carelink summary report received. Battery status OK. Normal device function. No new symptom episodes, tachy episodes, brady, or pause episodes. No new AF episodes. Monthly summary reports and ROV/PRN 

## 2016-09-10 ENCOUNTER — Ambulatory Visit (INDEPENDENT_AMBULATORY_CARE_PROVIDER_SITE_OTHER): Payer: Medicare Other | Admitting: *Deleted

## 2016-09-10 DIAGNOSIS — R55 Syncope and collapse: Secondary | ICD-10-CM | POA: Diagnosis not present

## 2016-09-11 NOTE — Progress Notes (Signed)
Carelink Summary Report / Loop Recorder 

## 2016-09-19 LAB — CUP PACEART REMOTE DEVICE CHECK
Implantable Pulse Generator Implant Date: 20180309
MDC IDC SESS DTM: 20180707214635

## 2016-10-08 ENCOUNTER — Ambulatory Visit (INDEPENDENT_AMBULATORY_CARE_PROVIDER_SITE_OTHER): Payer: Medicare Other | Admitting: *Deleted

## 2016-10-08 DIAGNOSIS — R55 Syncope and collapse: Secondary | ICD-10-CM | POA: Diagnosis not present

## 2016-10-09 NOTE — Progress Notes (Signed)
Carelink Summary Report / Loop Recorder 

## 2016-10-11 DIAGNOSIS — R55 Syncope and collapse: Secondary | ICD-10-CM | POA: Diagnosis not present

## 2016-10-11 DIAGNOSIS — J301 Allergic rhinitis due to pollen: Secondary | ICD-10-CM | POA: Diagnosis not present

## 2016-10-17 DIAGNOSIS — R55 Syncope and collapse: Secondary | ICD-10-CM | POA: Diagnosis not present

## 2016-10-17 LAB — CUP PACEART REMOTE DEVICE CHECK
Implantable Pulse Generator Implant Date: 20180309
MDC IDC SESS DTM: 20180806220913

## 2016-10-18 DIAGNOSIS — Z853 Personal history of malignant neoplasm of breast: Secondary | ICD-10-CM | POA: Diagnosis not present

## 2016-10-18 DIAGNOSIS — R1011 Right upper quadrant pain: Secondary | ICD-10-CM | POA: Diagnosis not present

## 2016-10-18 DIAGNOSIS — R10811 Right upper quadrant abdominal tenderness: Secondary | ICD-10-CM | POA: Diagnosis not present

## 2016-10-19 DIAGNOSIS — R10819 Abdominal tenderness, unspecified site: Secondary | ICD-10-CM | POA: Diagnosis not present

## 2016-10-19 DIAGNOSIS — K760 Fatty (change of) liver, not elsewhere classified: Secondary | ICD-10-CM | POA: Diagnosis not present

## 2016-10-19 DIAGNOSIS — C50212 Malignant neoplasm of upper-inner quadrant of left female breast: Secondary | ICD-10-CM | POA: Diagnosis not present

## 2016-10-24 DIAGNOSIS — M545 Low back pain: Secondary | ICD-10-CM | POA: Diagnosis not present

## 2016-11-01 ENCOUNTER — Ambulatory Visit: Payer: Medicare Other | Admitting: Internal Medicine

## 2016-11-01 DIAGNOSIS — M25511 Pain in right shoulder: Secondary | ICD-10-CM | POA: Diagnosis not present

## 2016-11-01 DIAGNOSIS — R55 Syncope and collapse: Secondary | ICD-10-CM | POA: Diagnosis not present

## 2016-11-01 DIAGNOSIS — M545 Low back pain: Secondary | ICD-10-CM | POA: Diagnosis not present

## 2016-11-02 ENCOUNTER — Encounter: Payer: Self-pay | Admitting: Internal Medicine

## 2016-11-02 ENCOUNTER — Ambulatory Visit (INDEPENDENT_AMBULATORY_CARE_PROVIDER_SITE_OTHER): Payer: Medicare Other | Admitting: Internal Medicine

## 2016-11-02 VITALS — BP 142/60 | HR 93 | Ht 61.0 in | Wt 109.6 lb

## 2016-11-02 DIAGNOSIS — R55 Syncope and collapse: Secondary | ICD-10-CM

## 2016-11-02 DIAGNOSIS — Z79899 Other long term (current) drug therapy: Secondary | ICD-10-CM

## 2016-11-02 NOTE — Patient Instructions (Signed)
Your physician recommends that you schedule a follow-up appointment as needed  

## 2016-11-02 NOTE — Addendum Note (Signed)
Addended by: Zebedee Iba on: 11/02/2016 04:22 PM   Modules accepted: Orders

## 2016-11-02 NOTE — Progress Notes (Signed)
OFFICE NOTE  Chief Complaint:  Follow-up syncope  Primary Care Physician: [name], MD  HPI:  Anne [name] is a 71 y.o. female who presents for evaluation of syncope. Unfortunately she was diagnosed with breast cancer around March of last year and underwent bilateral mastectomy. She said she did not require radiation or chemotherapy. About 3 months later she had her first syncopal episode. 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 Subsequently she's had for other events however they were not associated with any prodrome. 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 She's had some significant ecchymosis and has no recollection of these events. One feature, she noted was that she was banging her knee up against the cabinet or counter for a short period of time prior to the event. This was involuntary movement. She does have a history of migraine headaches and is on medications for that including Topamax and Imitrex as needed. Medications for anxiety and insomnia. She denies any chest pain or worsening shortness of breath. Her primary care provider ordered a monitor which showed no evidence of significant pauses, A. fib, nonsustained VT or heart block. She apparently recently had an echo as well at [facility] which demonstrated no significant findings. We will request those records. EKG in the office today shows normal sinus rhythm at 66.  07/02/2016  Gracee returns today for follow-up of syncope and implanted loop recorder. I referred her to Dr. Curt Bears for implanted loop recorder which was placed. Subsequent to that she had an episode of dizziness and syncope. This was reported and her monitor was interrogated. This indicated no evidence of A. fib, tachycardia or bradycardia arrhythmias or significant positive straight explain her syncopal  episode. Blood pressure however was low with systolics in the 78G. She's had some intermittent low blood pressures and lability to her blood pressure over the past several months. 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 She's also had difficulty sleeping and at night and she is on a number of medications. I reviewed her med list and it indicates she is on Vicodin, hydroxyzine, mirtazapine, pramipexole, peroxetine, temazepam, topiramate and trazodone. The indication for all these medications is not totally clear however, I suspect that she may be suffering from polypharmacy which could put her at risk for neurologic events or syncope related to medications. Her description was also concerning for possible neurologic etiology. Cardiac workup has been unremarkable.  11/02/2016  Anne [name] returns today for follow-up. She's had one further syncopal episode which was not associated with an arrhythmia. She seen Dr. Curt Bears who did not feel there is an arrhythmia as a cause of her symptoms. She has subsequently come off of a lot of her medications and seems to be functioning much better. 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 She denies any chest pain or worsening shortness of breath. Blood pressure is initially elevated however came down to 126/58.  PMHx:  Past Medical History:  Diagnosis Date  . Asthma    daily inhalers  . Breast cancer ([place]) 05/2015   left  . COPD (chronic obstructive pulmonary disease) ([place])    denies SOB with ADLs; no O2  . Dental crowns present   . Depression   . History of concussion  01/01/2014  . History of kidney stones   . History of TIA (transient ischemic attack) 10/2014  . Hyperlipidemia   . Insomnia   . Migraine   . Nasal congestion 05/26/2015   will finish z-pak 05/27/2015  . Nonproductive cough 05/26/2015  . PONV (postoperative nausea and vomiting)    "long time ago"  none recently  . RLS  (restless legs syndrome)   . Tobacco abuse   . Vitamin D deficiency   . Wears dentures    lower  . Wears partial dentures    upper    Past Surgical History:  Procedure Laterality Date  . ABDOMINAL HYSTERECTOMY  2000   complete  . APPENDECTOMY  2000  . BREAST BIOPSY Left   . BREAST RECONSTRUCTION WITH PLACEMENT OF TISSUE EXPANDER AND FLEX HD (ACELLULAR HYDRATED DERMIS) Bilateral 06/02/2015   Procedure: BILATERAL BREAST RECONSTRUCTION WITH PLACEMENT OF TISSUE EXPANDER AND  ACELLULAR HYDRATED DERMIS;  Surgeon: Irene Limbo, MD;  Location: [facility];  Service: Plastics;  Laterality: Bilateral;  . CARPAL TUNNEL RELEASE Bilateral   . CATARACT EXTRACTION Bilateral 09/2013  . KNEE ARTHROSCOPY Left   . LOOP RECORDER INSERTION N/A 05/11/2016   Procedure: Loop Recorder Insertion;  Surgeon: Will Meredith Leeds, MD;  Location: [place] [facility];  Service: Cardiovascular;  Laterality: N/A;  . MASTECTOMY W/ SENTINEL NODE BIOPSY Bilateral 06/02/2015   Procedure: BILATERAL MASTECTOMY WITH LEFT SENTINEL LYMPH NODE BIOPSY;  Surgeon: Stark Klein, MD;  Location: [facility];  Service: General;  Laterality: Bilateral;  . NASAL SEPTUM SURGERY     x 3  . PLANTAR FASCIA SURGERY Left   . REMOVAL OF BILATERAL TISSUE EXPANDERS WITH PLACEMENT OF BILATERAL BREAST IMPLANTS Bilateral 06/17/2015   Procedure: DEBRIDEMENT OF BILATERAL MASTECTOMY FLAP WITH BILATERAL TISSUE EXPANDER EXCHANGE ;  Surgeon: Irene Limbo, MD;  Location: [place];  Service: Plastics;  Laterality: Bilateral;  . REMOVAL OF TISSUE EXPANDER Bilateral 07/12/2015   Procedure: REMOVAL OF BILATERAL TISSUE EXPANDERS;  Surgeon: Irene Limbo, MD;  Location: [place];  Service: Plastics;  Laterality: Bilateral;    FAMHx:  Family History  Problem Relation Age of Onset  . Cervical cancer Mother 28  . Colon cancer Maternal Grandfather        mets to stomach; dx. 67-70  . Heart disease Father   .  Prostate cancer Father 50  . Cervical cancer Maternal Grandmother        dx. 41s; treated with radium implant  . Lung cancer Sister 24       maternal half-sister dx. lung cancer, stage III  . Breast cancer Maternal Aunt   . Cervical cancer Sister 70       paternal half-sister; s/p TAH  . Spina bifida Grandchild   . Brain cancer Grandchild        grandson dx. at 20 mos, [facility] at [facility]  . Breast cancer Other 59       maternal half-sister's daughter  . Cancer Maternal Aunt        d. 17; unspecified type - "began at back area and moved to vital organs"  . Cancer Maternal Uncle         late 53s; unspecified type; "started at back and moved to vital organs"    SOCHx:   reports that she has been smoking Cigarettes.  She has been smoking about 0.25 packs per day. She has never used smokeless tobacco. She reports that she drinks alcohol. She reports that  she does not use drugs.  ALLERGIES:  Allergies  Allergen Reactions  . Rocephin [Ceftriaxone Sodium In Dextrose] Shortness Of Breath and Rash  . Clindamycin/Lincomycin Nausea And Vomiting  . Gabapentin Other (See Comments)    Personality Changes  . Lyrica [Pregabalin] Other (See Comments)    PERSONALITY CHANGE    ROS: Pertinent items noted in HPI and remainder of comprehensive ROS otherwise negative.  HOME MEDS: Current Outpatient Prescriptions on File Prior to Visit  Medication Sig Dispense Refill  . atorvastatin (LIPITOR) 20 MG tablet Take 20 mg by mouth daily.    Marland Kitchen BREO ELLIPTA 100-25 MCG/INH AEPB Inhale 1 puff into the lungs daily as needed (for respiratory issues).     . Cholecalciferol (VITAMIN D3) 50000 UNITS CAPS Take 50,000 Units by mouth every 30 (thirty) days.     . furosemide (LASIX) 20 MG tablet Take 20 mg by mouth daily as needed for edema (for leg swelling).     Marland Kitchen HYDROcodone-acetaminophen (NORCO/VICODIN) 5-325 MG tablet Take 1 tablet by mouth every 4 (four) hours as needed (for pain.).     Marland Kitchen mirtazapine (REMERON) 15  MG tablet Take 15 mg by mouth daily.     . nicotine (NICODERM CQ - DOSED IN MG/24 HOURS) 21 mg/24hr patch Place 1 patch (21 mg total) onto the skin daily. 28 patch 0  . promethazine (PHENERGAN) 25 MG tablet Take 1 tablet by mouth as needed for nausea/vomiting.    . SUMAtriptan (IMITREX) 100 MG tablet Take 100 mg by mouth daily as needed for migraine (100 mg and then may repeat in 2 hours if headache persists).     . tamoxifen (NOLVADEX) 20 MG tablet Take 20 mg by mouth at bedtime.     . temazepam (RESTORIL) 15 MG capsule Take 15 mg by mouth at bedtime.     . topiramate (TOPAMAX) 50 MG tablet Take 50 mg by mouth at bedtime.    . VENTOLIN HFA 108 (90 BASE) MCG/ACT inhaler Inhale 1 puff into the lungs every 6 (six) hours as needed for wheezing or shortness of breath.      No current facility-administered medications on file prior to visit.     LABS/IMAGING: No results found for this or any previous visit (from the past 48 hour(s)). No results found.  WEIGHTS: Wt Readings from Last 3 Encounters:  11/02/16 109 lb 9.6 oz (49.7 kg)  08/13/16 113 lb (51.3 kg)  07/03/16 115 lb 4.8 oz (52.3 kg)    VITALS: BP (!) 142/60   Pulse 93   Ht 5\' 1"  (1.549 m)   Wt 109 lb 9.6 oz (49.7 kg)   BMI 20.71 kg/m   EXAM: Deferred  EKG: Normal sinus rhythm at 93, nonspecific ST changes-personally reviewed   ASSESSMENT: 1. Drop syncope- no arrhythmias noted on ILR 2. ?Complex-partial seizure 3. Polypharmacy  PLAN: 1.   Mrs. Vigorito has no detectable arrhythmias on her implanted loop recorder and had only one further single episode. She is now weaned off of a lot of her medications. I think her symptoms may be due to polypharmacy. Stress could've also played a role. Overall she's doing better. She prefers to continue have monitoring of the loop recorder until the battery runs out. This will be followed by device clinic.  Follow-up with me as needed.  Pixie Casino, MD, [facility] Attending  Cardiologist [place] 11/02/2016, 3:23 PM

## 2016-11-06 ENCOUNTER — Telehealth: Payer: Self-pay | Admitting: Internal Medicine

## 2016-11-06 NOTE — Telephone Encounter (Signed)
SPOKE TO PATIENT   INFORMED  HER TO SHOW LOOP RECORDER ID MANUFACTORY CARD TO [employer]  AT AIRPORT- IF SHE WILL BE GONE LESS THAN 2 WEEKS SHE DOES NOT NEED TO BRING TRANSMISSION EQUIPMENT.  PATIENT STATES SHE CAN NOT FIND  CARD.  RN INFORMED PATIENT PER DEVICE CLINIC [facility].     HAVE THE PATIENT JUST NOTIFY AIRPORT SECURITY AND THE Y WILL WAND HER. THE [employer]  NUMBER ([phone]) GIVEN TO PATIENT - --TO CALL AN GET A COPY OF CARD FOR FUTURE NEEDS.

## 2016-11-06 NOTE — Telephone Encounter (Signed)
New message   Pt wants to know to know if her loop recorder is going to be okay with her traveling on a plane to [place] on Thursday.

## 2016-11-07 ENCOUNTER — Ambulatory Visit (INDEPENDENT_AMBULATORY_CARE_PROVIDER_SITE_OTHER): Payer: Medicare Other | Admitting: *Deleted

## 2016-11-07 DIAGNOSIS — R55 Syncope and collapse: Secondary | ICD-10-CM

## 2016-11-08 NOTE — Progress Notes (Signed)
Carelink Summary Report / Loop Recorder 

## 2016-11-12 LAB — CUP PACEART REMOTE DEVICE CHECK
Implantable Pulse Generator Implant Date: 20180309
MDC IDC SESS DTM: 20180905221332

## 2016-11-15 DIAGNOSIS — J018 Other acute sinusitis: Secondary | ICD-10-CM | POA: Diagnosis not present

## 2016-11-15 DIAGNOSIS — R10811 Right upper quadrant abdominal tenderness: Secondary | ICD-10-CM | POA: Diagnosis not present

## 2016-12-07 ENCOUNTER — Ambulatory Visit (INDEPENDENT_AMBULATORY_CARE_PROVIDER_SITE_OTHER): Payer: Medicare Other | Admitting: *Deleted

## 2016-12-07 DIAGNOSIS — R55 Syncope and collapse: Secondary | ICD-10-CM | POA: Diagnosis not present

## 2016-12-10 DIAGNOSIS — R7301 Impaired fasting glucose: Secondary | ICD-10-CM | POA: Diagnosis not present

## 2016-12-10 DIAGNOSIS — R10811 Right upper quadrant abdominal tenderness: Secondary | ICD-10-CM | POA: Diagnosis not present

## 2016-12-10 DIAGNOSIS — D485 Neoplasm of uncertain behavior of skin: Secondary | ICD-10-CM | POA: Diagnosis not present

## 2016-12-10 DIAGNOSIS — Z23 Encounter for immunization: Secondary | ICD-10-CM | POA: Diagnosis not present

## 2016-12-10 DIAGNOSIS — F320 Major depressive disorder, single episode, mild: Secondary | ICD-10-CM | POA: Diagnosis not present

## 2016-12-10 DIAGNOSIS — E782 Mixed hyperlipidemia: Secondary | ICD-10-CM | POA: Diagnosis not present

## 2016-12-10 DIAGNOSIS — I10 Essential (primary) hypertension: Secondary | ICD-10-CM | POA: Diagnosis not present

## 2016-12-10 DIAGNOSIS — F17200 Nicotine dependence, unspecified, uncomplicated: Secondary | ICD-10-CM | POA: Diagnosis not present

## 2016-12-10 DIAGNOSIS — G43109 Migraine with aura, not intractable, without status migrainosus: Secondary | ICD-10-CM | POA: Diagnosis not present

## 2016-12-10 LAB — CUP PACEART REMOTE DEVICE CHECK
Implantable Pulse Generator Implant Date: 20180309
MDC IDC SESS DTM: 20181005224352

## 2016-12-10 NOTE — Progress Notes (Signed)
Carelink Summary Report / Loop Recorder 

## 2016-12-11 DIAGNOSIS — T50B95A Adverse effect of other viral vaccines, initial encounter: Secondary | ICD-10-CM | POA: Diagnosis not present

## 2016-12-11 DIAGNOSIS — R109 Unspecified abdominal pain: Secondary | ICD-10-CM | POA: Diagnosis not present

## 2016-12-11 DIAGNOSIS — R10811 Right upper quadrant abdominal tenderness: Secondary | ICD-10-CM | POA: Diagnosis not present

## 2016-12-11 DIAGNOSIS — K829 Disease of gallbladder, unspecified: Secondary | ICD-10-CM | POA: Diagnosis not present

## 2016-12-26 DIAGNOSIS — J441 Chronic obstructive pulmonary disease with (acute) exacerbation: Secondary | ICD-10-CM | POA: Diagnosis not present

## 2017-01-03 DIAGNOSIS — R05 Cough: Secondary | ICD-10-CM | POA: Diagnosis not present

## 2017-01-03 DIAGNOSIS — J441 Chronic obstructive pulmonary disease with (acute) exacerbation: Secondary | ICD-10-CM | POA: Diagnosis not present

## 2017-01-07 ENCOUNTER — Ambulatory Visit (INDEPENDENT_AMBULATORY_CARE_PROVIDER_SITE_OTHER): Payer: Medicare Other | Admitting: *Deleted

## 2017-01-07 DIAGNOSIS — R55 Syncope and collapse: Secondary | ICD-10-CM

## 2017-01-08 LAB — CUP PACEART REMOTE DEVICE CHECK
Date Time Interrogation Session: 20181105003903
Implantable Pulse Generator Implant Date: 20180309

## 2017-01-08 NOTE — Progress Notes (Signed)
Carelink Summary Report / Loop Recorder 

## 2017-01-16 DIAGNOSIS — D485 Neoplasm of uncertain behavior of skin: Secondary | ICD-10-CM | POA: Diagnosis not present

## 2017-01-16 DIAGNOSIS — L821 Other seborrheic keratosis: Secondary | ICD-10-CM | POA: Diagnosis not present

## 2017-01-16 DIAGNOSIS — D225 Melanocytic nevi of trunk: Secondary | ICD-10-CM | POA: Diagnosis not present

## 2017-01-16 DIAGNOSIS — D1801 Hemangioma of skin and subcutaneous tissue: Secondary | ICD-10-CM | POA: Diagnosis not present

## 2017-01-23 DIAGNOSIS — J441 Chronic obstructive pulmonary disease with (acute) exacerbation: Secondary | ICD-10-CM | POA: Diagnosis not present

## 2017-02-05 ENCOUNTER — Ambulatory Visit (INDEPENDENT_AMBULATORY_CARE_PROVIDER_SITE_OTHER): Payer: Medicare Other | Admitting: *Deleted

## 2017-02-05 DIAGNOSIS — R55 Syncope and collapse: Secondary | ICD-10-CM | POA: Diagnosis not present

## 2017-02-06 NOTE — Progress Notes (Signed)
Carelink Summary Report / Loop Recorder 

## 2017-02-15 LAB — CUP PACEART REMOTE DEVICE CHECK
Date Time Interrogation Session: 20181205004047
MDC IDC PG IMPLANT DT: 20180309

## 2017-02-20 ENCOUNTER — Telehealth: Payer: Self-pay | Admitting: Cardiology

## 2017-02-20 NOTE — Telephone Encounter (Signed)
LMOVM requesting that pt send manual transmission b/c home monitor has not updated in at least 14 days.    

## 2017-02-28 ENCOUNTER — Encounter: Payer: Self-pay | Admitting: Cardiology

## 2017-03-07 ENCOUNTER — Encounter: Payer: Medicare Other | Admitting: *Deleted

## 2017-03-19 DIAGNOSIS — F332 Major depressive disorder, recurrent severe without psychotic features: Secondary | ICD-10-CM | POA: Diagnosis not present

## 2017-03-19 DIAGNOSIS — G4709 Other insomnia: Secondary | ICD-10-CM | POA: Diagnosis not present

## 2017-03-26 DIAGNOSIS — F321 Major depressive disorder, single episode, moderate: Secondary | ICD-10-CM | POA: Diagnosis not present

## 2017-03-26 DIAGNOSIS — F5104 Psychophysiologic insomnia: Secondary | ICD-10-CM | POA: Diagnosis not present

## 2017-03-26 DIAGNOSIS — J441 Chronic obstructive pulmonary disease with (acute) exacerbation: Secondary | ICD-10-CM | POA: Diagnosis not present

## 2017-03-26 DIAGNOSIS — R05 Cough: Secondary | ICD-10-CM | POA: Diagnosis not present

## 2017-04-02 DIAGNOSIS — Z7952 Long term (current) use of systemic steroids: Secondary | ICD-10-CM | POA: Diagnosis not present

## 2017-04-02 DIAGNOSIS — Z79899 Other long term (current) drug therapy: Secondary | ICD-10-CM | POA: Diagnosis not present

## 2017-04-02 DIAGNOSIS — I10 Essential (primary) hypertension: Secondary | ICD-10-CM | POA: Diagnosis not present

## 2017-04-02 DIAGNOSIS — R0602 Shortness of breath: Secondary | ICD-10-CM | POA: Diagnosis not present

## 2017-04-02 DIAGNOSIS — R079 Chest pain, unspecified: Secondary | ICD-10-CM | POA: Diagnosis not present

## 2017-04-02 DIAGNOSIS — R0789 Other chest pain: Secondary | ICD-10-CM | POA: Diagnosis not present

## 2017-04-02 DIAGNOSIS — J40 Bronchitis, not specified as acute or chronic: Secondary | ICD-10-CM | POA: Diagnosis not present

## 2017-04-02 DIAGNOSIS — F17210 Nicotine dependence, cigarettes, uncomplicated: Secondary | ICD-10-CM | POA: Diagnosis not present

## 2017-04-02 DIAGNOSIS — Z716 Tobacco abuse counseling: Secondary | ICD-10-CM | POA: Diagnosis not present

## 2017-04-02 DIAGNOSIS — E876 Hypokalemia: Secondary | ICD-10-CM | POA: Diagnosis not present

## 2017-04-02 DIAGNOSIS — I249 Acute ischemic heart disease, unspecified: Secondary | ICD-10-CM | POA: Diagnosis not present

## 2017-04-02 DIAGNOSIS — J449 Chronic obstructive pulmonary disease, unspecified: Secondary | ICD-10-CM | POA: Diagnosis not present

## 2017-04-08 ENCOUNTER — Ambulatory Visit (INDEPENDENT_AMBULATORY_CARE_PROVIDER_SITE_OTHER): Payer: Medicare Other | Admitting: *Deleted

## 2017-04-08 DIAGNOSIS — R55 Syncope and collapse: Secondary | ICD-10-CM | POA: Diagnosis not present

## 2017-04-08 DIAGNOSIS — R5383 Other fatigue: Secondary | ICD-10-CM | POA: Diagnosis not present

## 2017-04-08 DIAGNOSIS — F5101 Primary insomnia: Secondary | ICD-10-CM | POA: Diagnosis not present

## 2017-04-08 DIAGNOSIS — R0789 Other chest pain: Secondary | ICD-10-CM | POA: Diagnosis not present

## 2017-04-08 DIAGNOSIS — R05 Cough: Secondary | ICD-10-CM | POA: Diagnosis not present

## 2017-04-08 DIAGNOSIS — B370 Candidal stomatitis: Secondary | ICD-10-CM | POA: Diagnosis not present

## 2017-04-08 NOTE — Progress Notes (Signed)
Carelink Summary Reprot / Loop Recorder 

## 2017-04-10 DIAGNOSIS — R05 Cough: Secondary | ICD-10-CM | POA: Diagnosis not present

## 2017-04-10 DIAGNOSIS — R0789 Other chest pain: Secondary | ICD-10-CM | POA: Diagnosis not present

## 2017-04-10 DIAGNOSIS — J984 Other disorders of lung: Secondary | ICD-10-CM | POA: Diagnosis not present

## 2017-04-16 DIAGNOSIS — R05 Cough: Secondary | ICD-10-CM | POA: Diagnosis not present

## 2017-04-16 DIAGNOSIS — J208 Acute bronchitis due to other specified organisms: Secondary | ICD-10-CM | POA: Diagnosis not present

## 2017-04-26 DIAGNOSIS — M7918 Myalgia, other site: Secondary | ICD-10-CM | POA: Diagnosis not present

## 2017-04-26 DIAGNOSIS — E782 Mixed hyperlipidemia: Secondary | ICD-10-CM | POA: Diagnosis not present

## 2017-04-26 DIAGNOSIS — L988 Other specified disorders of the skin and subcutaneous tissue: Secondary | ICD-10-CM | POA: Diagnosis not present

## 2017-04-30 LAB — CUP PACEART REMOTE DEVICE CHECK
Date Time Interrogation Session: 20190203013939
Implantable Pulse Generator Implant Date: 20180309

## 2017-05-09 ENCOUNTER — Ambulatory Visit (INDEPENDENT_AMBULATORY_CARE_PROVIDER_SITE_OTHER): Payer: Medicare Other | Admitting: *Deleted

## 2017-05-09 DIAGNOSIS — R55 Syncope and collapse: Secondary | ICD-10-CM

## 2017-05-10 NOTE — Progress Notes (Signed)
Carelink Summary Report / Loop Recorder 

## 2017-05-27 DIAGNOSIS — Z961 Presence of intraocular lens: Secondary | ICD-10-CM | POA: Diagnosis not present

## 2017-05-27 DIAGNOSIS — H04123 Dry eye syndrome of bilateral lacrimal glands: Secondary | ICD-10-CM | POA: Diagnosis not present

## 2017-05-27 DIAGNOSIS — H43813 Vitreous degeneration, bilateral: Secondary | ICD-10-CM | POA: Diagnosis not present

## 2017-05-27 DIAGNOSIS — H35033 Hypertensive retinopathy, bilateral: Secondary | ICD-10-CM | POA: Diagnosis not present

## 2017-06-11 ENCOUNTER — Ambulatory Visit (INDEPENDENT_AMBULATORY_CARE_PROVIDER_SITE_OTHER): Payer: Medicare Other | Admitting: *Deleted

## 2017-06-11 DIAGNOSIS — R55 Syncope and collapse: Secondary | ICD-10-CM | POA: Diagnosis not present

## 2017-06-12 NOTE — Progress Notes (Signed)
Carelink Summary Report / Loop Recorder 

## 2017-06-14 DIAGNOSIS — R61 Generalized hyperhidrosis: Secondary | ICD-10-CM | POA: Diagnosis not present

## 2017-06-14 DIAGNOSIS — R0789 Other chest pain: Secondary | ICD-10-CM | POA: Diagnosis not present

## 2017-06-14 DIAGNOSIS — R9431 Abnormal electrocardiogram [ECG] [EKG]: Secondary | ICD-10-CM | POA: Diagnosis not present

## 2017-06-14 DIAGNOSIS — R079 Chest pain, unspecified: Secondary | ICD-10-CM | POA: Diagnosis not present

## 2017-06-15 DIAGNOSIS — R9431 Abnormal electrocardiogram [ECG] [EKG]: Secondary | ICD-10-CM | POA: Diagnosis not present

## 2017-06-19 DIAGNOSIS — G43009 Migraine without aura, not intractable, without status migrainosus: Secondary | ICD-10-CM | POA: Diagnosis not present

## 2017-06-19 DIAGNOSIS — R072 Precordial pain: Secondary | ICD-10-CM | POA: Diagnosis not present

## 2017-06-20 LAB — CUP PACEART REMOTE DEVICE CHECK
Date Time Interrogation Session: 20190308023513
MDC IDC PG IMPLANT DT: 20180309

## 2017-07-15 ENCOUNTER — Ambulatory Visit (INDEPENDENT_AMBULATORY_CARE_PROVIDER_SITE_OTHER): Payer: Medicare Other | Admitting: *Deleted

## 2017-07-15 DIAGNOSIS — R55 Syncope and collapse: Secondary | ICD-10-CM | POA: Diagnosis not present

## 2017-07-15 LAB — CUP PACEART REMOTE DEVICE CHECK
Date Time Interrogation Session: 20190410033948
Implantable Pulse Generator Implant Date: 20180309

## 2017-07-16 NOTE — Progress Notes (Signed)
Carelink Summary Report / Loop Recorder 

## 2017-07-20 DIAGNOSIS — K121 Other forms of stomatitis: Secondary | ICD-10-CM | POA: Diagnosis not present

## 2017-07-20 DIAGNOSIS — F4321 Adjustment disorder with depressed mood: Secondary | ICD-10-CM | POA: Diagnosis not present

## 2017-07-20 DIAGNOSIS — R634 Abnormal weight loss: Secondary | ICD-10-CM | POA: Diagnosis not present

## 2017-07-26 DIAGNOSIS — R627 Adult failure to thrive: Secondary | ICD-10-CM | POA: Diagnosis not present

## 2017-07-26 DIAGNOSIS — F331 Major depressive disorder, recurrent, moderate: Secondary | ICD-10-CM | POA: Diagnosis not present

## 2017-07-26 DIAGNOSIS — R600 Localized edema: Secondary | ICD-10-CM | POA: Diagnosis not present

## 2017-07-26 DIAGNOSIS — E782 Mixed hyperlipidemia: Secondary | ICD-10-CM | POA: Diagnosis not present

## 2017-07-26 DIAGNOSIS — R0789 Other chest pain: Secondary | ICD-10-CM | POA: Diagnosis not present

## 2017-07-26 DIAGNOSIS — R51 Headache: Secondary | ICD-10-CM | POA: Diagnosis not present

## 2017-07-26 DIAGNOSIS — R634 Abnormal weight loss: Secondary | ICD-10-CM | POA: Diagnosis not present

## 2017-07-26 DIAGNOSIS — G4709 Other insomnia: Secondary | ICD-10-CM | POA: Diagnosis not present

## 2017-08-07 LAB — CUP PACEART REMOTE DEVICE CHECK
Implantable Pulse Generator Implant Date: 20180309
MDC IDC SESS DTM: 20190513063941

## 2017-08-08 DIAGNOSIS — J018 Other acute sinusitis: Secondary | ICD-10-CM | POA: Diagnosis not present

## 2017-08-13 ENCOUNTER — Telehealth: Payer: Self-pay

## 2017-08-13 NOTE — Telephone Encounter (Signed)
Patient called because her loop recorder has moved out of place. I let her talk to device tech nurse.

## 2017-08-19 ENCOUNTER — Ambulatory Visit (INDEPENDENT_AMBULATORY_CARE_PROVIDER_SITE_OTHER): Payer: Medicare Other | Admitting: *Deleted

## 2017-08-19 DIAGNOSIS — R55 Syncope and collapse: Secondary | ICD-10-CM

## 2017-08-19 NOTE — Progress Notes (Signed)
Carelink Summary Report / Loop Recorder 

## 2017-08-23 DIAGNOSIS — Z800 Family history of malignant neoplasm of digestive organs: Secondary | ICD-10-CM | POA: Diagnosis not present

## 2017-08-23 DIAGNOSIS — M7989 Other specified soft tissue disorders: Secondary | ICD-10-CM | POA: Diagnosis not present

## 2017-08-23 DIAGNOSIS — S99912A Unspecified injury of left ankle, initial encounter: Secondary | ICD-10-CM | POA: Diagnosis not present

## 2017-08-23 DIAGNOSIS — Z8049 Family history of malignant neoplasm of other genital organs: Secondary | ICD-10-CM | POA: Diagnosis not present

## 2017-08-23 DIAGNOSIS — Z853 Personal history of malignant neoplasm of breast: Secondary | ICD-10-CM | POA: Diagnosis not present

## 2017-08-23 DIAGNOSIS — F17210 Nicotine dependence, cigarettes, uncomplicated: Secondary | ICD-10-CM | POA: Diagnosis not present

## 2017-08-23 DIAGNOSIS — M25572 Pain in left ankle and joints of left foot: Secondary | ICD-10-CM | POA: Diagnosis not present

## 2017-08-23 DIAGNOSIS — Z801 Family history of malignant neoplasm of trachea, bronchus and lung: Secondary | ICD-10-CM | POA: Diagnosis not present

## 2017-08-23 DIAGNOSIS — Z803 Family history of malignant neoplasm of breast: Secondary | ICD-10-CM | POA: Diagnosis not present

## 2017-08-23 DIAGNOSIS — Z808 Family history of malignant neoplasm of other organs or systems: Secondary | ICD-10-CM | POA: Diagnosis not present

## 2017-08-26 DIAGNOSIS — R634 Abnormal weight loss: Secondary | ICD-10-CM | POA: Diagnosis not present

## 2017-08-26 DIAGNOSIS — R1011 Right upper quadrant pain: Secondary | ICD-10-CM | POA: Diagnosis not present

## 2017-08-26 DIAGNOSIS — E441 Mild protein-calorie malnutrition: Secondary | ICD-10-CM | POA: Diagnosis not present

## 2017-08-26 DIAGNOSIS — R51 Headache: Secondary | ICD-10-CM | POA: Diagnosis not present

## 2017-08-26 DIAGNOSIS — G4709 Other insomnia: Secondary | ICD-10-CM | POA: Diagnosis not present

## 2017-08-26 DIAGNOSIS — F331 Major depressive disorder, recurrent, moderate: Secondary | ICD-10-CM | POA: Diagnosis not present

## 2017-08-30 DIAGNOSIS — Z853 Personal history of malignant neoplasm of breast: Secondary | ICD-10-CM | POA: Diagnosis not present

## 2017-08-30 DIAGNOSIS — R1011 Right upper quadrant pain: Secondary | ICD-10-CM | POA: Diagnosis not present

## 2017-08-30 DIAGNOSIS — R590 Localized enlarged lymph nodes: Secondary | ICD-10-CM | POA: Diagnosis not present

## 2017-08-30 DIAGNOSIS — R1010 Upper abdominal pain, unspecified: Secondary | ICD-10-CM | POA: Diagnosis not present

## 2017-08-30 DIAGNOSIS — I700 Atherosclerosis of aorta: Secondary | ICD-10-CM | POA: Diagnosis not present

## 2017-08-30 DIAGNOSIS — R935 Abnormal findings on diagnostic imaging of other abdominal regions, including retroperitoneum: Secondary | ICD-10-CM | POA: Diagnosis not present

## 2017-09-02 DIAGNOSIS — R1033 Periumbilical pain: Secondary | ICD-10-CM | POA: Diagnosis not present

## 2017-09-02 DIAGNOSIS — H6011 Cellulitis of right external ear: Secondary | ICD-10-CM | POA: Diagnosis not present

## 2017-09-02 DIAGNOSIS — W57XXXA Bitten or stung by nonvenomous insect and other nonvenomous arthropods, initial encounter: Secondary | ICD-10-CM | POA: Diagnosis not present

## 2017-09-10 DIAGNOSIS — K449 Diaphragmatic hernia without obstruction or gangrene: Secondary | ICD-10-CM | POA: Diagnosis not present

## 2017-09-10 DIAGNOSIS — R1033 Periumbilical pain: Secondary | ICD-10-CM | POA: Diagnosis not present

## 2017-09-13 DIAGNOSIS — J45909 Unspecified asthma, uncomplicated: Secondary | ICD-10-CM | POA: Insufficient documentation

## 2017-09-13 DIAGNOSIS — Z87820 Personal history of traumatic brain injury: Secondary | ICD-10-CM | POA: Insufficient documentation

## 2017-09-19 ENCOUNTER — Ambulatory Visit (INDEPENDENT_AMBULATORY_CARE_PROVIDER_SITE_OTHER): Payer: Medicare Other | Admitting: *Deleted

## 2017-09-19 DIAGNOSIS — R55 Syncope and collapse: Secondary | ICD-10-CM

## 2017-09-19 NOTE — Progress Notes (Signed)
Carelink Summary Report / Loop Recorder 

## 2017-09-23 DIAGNOSIS — Q433 Congenital malformations of intestinal fixation: Secondary | ICD-10-CM

## 2017-09-23 DIAGNOSIS — R1013 Epigastric pain: Secondary | ICD-10-CM | POA: Diagnosis not present

## 2017-09-23 DIAGNOSIS — R1011 Right upper quadrant pain: Secondary | ICD-10-CM | POA: Insufficient documentation

## 2017-09-23 DIAGNOSIS — K8010 Calculus of gallbladder with chronic cholecystitis without obstruction: Secondary | ICD-10-CM | POA: Diagnosis not present

## 2017-09-23 HISTORY — DX: Congenital malformations of intestinal fixation: Q43.3

## 2017-09-23 LAB — CUP PACEART REMOTE DEVICE CHECK
Date Time Interrogation Session: 20190615073616
MDC IDC PG IMPLANT DT: 20180309

## 2017-09-24 ENCOUNTER — Telehealth: Payer: Self-pay | Admitting: *Deleted

## 2017-09-24 NOTE — Telephone Encounter (Signed)
   [facility] Pre-operative Risk Assessment    Request for surgical clearance:  1. What type of surgery is being performed? LAPAROSCOPIC  CHOLECYSTECTOMY WITH INTRAOPERATIVE CHOLANGIOGRAM CORRECTION OF MALROTATION   2. When is this surgery scheduled? TBD   3. What type of clearance is required (medical clearance vs. Pharmacy clearance to hold med vs. Both)? CLEARANCE   4. Are there any medications that need to be held prior to surgery and how long?    5. Practice name and name of physician performing surgery? [facility] DR DAVID Noberto [facility]ort    6. What is your office phone number? [phone]    7.   What is your office fax number? [phone]   8.   Anesthesia type (None, local, MAC, general) ? UNKNOWN

## 2017-09-26 NOTE — Telephone Encounter (Signed)
Pt called back-appt scheduled 10-01-17 w/luke

## 2017-09-26 NOTE — Telephone Encounter (Signed)
Lm2cb-please hm & cell-unable to Lmvm.

## 2017-09-26 NOTE — Telephone Encounter (Signed)
   Primary Cardiologist:No primary care provider on file.  Chart reviewed as part of pre-operative protocol coverage. Because of Anne [name]'s past medical history and time since last visit, he/she will require a follow-up visit in order to better assess preoperative cardiovascular risk.  Pre-op covering staff: - Please schedule appointment and call patient to inform them. - Please contact requesting surgeon's office via preferred method (i.e, phone, fax) to inform them of need for appointment prior to surgery.  Tami Lin Duke, PA  09/26/2017, 3:28 PM

## 2017-09-27 DIAGNOSIS — L03012 Cellulitis of left finger: Secondary | ICD-10-CM | POA: Diagnosis not present

## 2017-09-30 DIAGNOSIS — N3001 Acute cystitis with hematuria: Secondary | ICD-10-CM | POA: Diagnosis not present

## 2017-10-01 ENCOUNTER — Ambulatory Visit (INDEPENDENT_AMBULATORY_CARE_PROVIDER_SITE_OTHER): Payer: Medicare Other | Admitting: Cardiology

## 2017-10-01 ENCOUNTER — Encounter: Payer: Self-pay | Admitting: Cardiology

## 2017-10-01 DIAGNOSIS — Z01818 Encounter for other preprocedural examination: Secondary | ICD-10-CM

## 2017-10-01 DIAGNOSIS — I6523 Occlusion and stenosis of bilateral carotid arteries: Secondary | ICD-10-CM

## 2017-10-01 DIAGNOSIS — J411 Mucopurulent chronic bronchitis: Secondary | ICD-10-CM | POA: Diagnosis not present

## 2017-10-01 DIAGNOSIS — R55 Syncope and collapse: Secondary | ICD-10-CM

## 2017-10-01 NOTE — Assessment & Plan Note (Signed)
Smoker 

## 2017-10-01 NOTE — Assessment & Plan Note (Signed)
Mild, 1-39% bilaterally by doppler in March 2018

## 2017-10-01 NOTE — Assessment & Plan Note (Addendum)
Pt has a Loop recorder in place. No recurrent episodes. No documented arrhythmia and normal LVF by echo May 2018

## 2017-10-01 NOTE — Assessment & Plan Note (Signed)
Pt seen today for pre op clearance prior to GB and small bowel surgery

## 2017-10-01 NOTE — Progress Notes (Signed)
10/01/2017 Anne [name]   25-Jan-1946  [id number]  Primary Physician [facility], Elnita Maxwell, MD Primary Cardiologist: Dr Debara Pickett  HPI:  Pleasant 72 y/o widowed female with a history of breast cancer, COPD, smoking, and past syncope. Pt evaluated for syncope in 2018. Echo showed normal LVF and she has a Loop recorder in place that has not show any arrhythmia. 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 She is in the office today for pre op cardiac clearance prior to cholecystectomy and small bowel surgery.  She denies chest or unusual dyspnea (some chronic dyspnea secondary to COPD). She is able to do house work and climb stairs with no limitation.    Current Outpatient Medications  Medication Sig Dispense Refill  . atorvastatin (LIPITOR) 20 MG tablet Take 20 mg by mouth daily.    Marland Kitchen BREO ELLIPTA 100-25 MCG/INH AEPB Inhale 1 puff into the lungs daily as needed (for respiratory issues).     . Cholecalciferol (VITAMIN D3) 50000 UNITS CAPS Take 50,000 Units by mouth every 30 (thirty) days.     . fenofibrate 160 MG tablet Take 160 mg by mouth daily.    . furosemide (LASIX) 20 MG tablet Take 20 mg by mouth daily as needed for edema (for leg swelling).     Marland Kitchen HYDROcodone-acetaminophen (NORCO/VICODIN) 5-325 MG tablet Take 1 tablet by mouth every 4 (four) hours as needed (for pain.).     Marland Kitchen mirtazapine (REMERON) 45 MG tablet Take 45 mg by mouth at bedtime.    . nicotine (NICODERM CQ - DOSED IN MG/24 HOURS) 21 mg/24hr patch Place 1 patch (21 mg total) onto the skin daily. 28 patch 0  . promethazine (PHENERGAN) 25 MG tablet Take 1 tablet by mouth as needed for nausea/vomiting.    . SUMAtriptan (IMITREX) 100 MG tablet Take 100 mg by mouth daily as needed for migraine (100 mg and then may repeat in 2 hours if headache persists).     . tamoxifen (NOLVADEX) 20 MG tablet Take 20 mg by mouth at  bedtime.     . temazepam (RESTORIL) 15 MG capsule Take 15 mg by mouth at bedtime.     Marland Kitchen tiZANidine (ZANAFLEX) 4 MG tablet Take 4 mg by mouth every 6 (six) hours as needed for muscle spasms.    Marland Kitchen venlafaxine XR (EFFEXOR-XR) 150 MG 24 hr capsule Take 150 mg by mouth at bedtime.    . VENTOLIN HFA 108 (90 BASE) MCG/ACT inhaler Inhale 1 puff into the lungs every 6 (six) hours as needed for wheezing or shortness of breath.      No current facility-administered medications for this visit.     Allergies  Allergen Reactions  . Rocephin [Ceftriaxone Sodium In Dextrose] Shortness Of Breath and Rash  . Clindamycin/Lincomycin Nausea And Vomiting  . Gabapentin Other (See Comments)    Personality Changes  . Lyrica [Pregabalin] Other (See Comments)    PERSONALITY CHANGE    Past Medical History:  Diagnosis Date  . Asthma    daily inhalers  . Breast cancer ([place]) 05/2015   left  . COPD (chronic obstructive pulmonary disease) ([place])    denies SOB with ADLs; no O2  . Dental crowns present   . Depression   . History of concussion 01/01/2014  . History of kidney stones   . History of TIA (transient ischemic attack) 10/2014  . Hyperlipidemia   .  Insomnia   . Migraine   . Nasal congestion 05/26/2015   will finish z-pak 05/27/2015  . Nonproductive cough 05/26/2015  . PONV (postoperative nausea and vomiting)    "long time ago"  none recently  . RLS (restless legs syndrome)   . Tobacco abuse   . Vitamin D deficiency   . Wears dentures    lower  . Wears partial dentures    upper    Social History   Socioeconomic History  . Marital status: Widowed    Spouse name: Not on file  . Number of children: 1  . Years of education: 19  . 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 Financial resource strain: Not on file  . Food insecurity:    Worry: Not on file    Inability: Not on file  . Transportation needs:    Medical:  Not on file    Non-medical: Not on file  Tobacco Use  . Smoking status: Light Tobacco Smoker    Packs/day: 0.25    Types: Cigarettes    Last attempt to quit: 05/04/2015    Years since quitting: 2.4  . Smokeless tobacco: Never Used  Substance and Sexual Activity  . Alcohol use: Yes    Alcohol/week: 0.0 oz    Comment: rarely  . Drug use: No  . Sexual activity: Not on file  Lifestyle  . Physical activity:    Days per week: Not on file    Minutes per session: Not on file  . Stress: Not on file  Relationships  . Social connections:    Talks on phone: Not on file    Gets together: Not on file    Attends religious service: Not on file    Active member of club or organization: Not on file    Attends meetings of clubs or organizations: Not on file    Relationship status: Not on file  . Intimate partner violence:    Fear of current or ex partner: Not on file    Emotionally abused: Not on file    Physically abused: Not on file    Forced sexual activity: Not on file  Other Topics Concern  . Not on file  Social History Narrative   Lives alone, widow   Caffeine use- tea, 1 cup daily     Family History  Problem Relation Age of Onset  . Cervical cancer Mother 81  . Colon cancer Maternal Grandfather        mets to stomach; dx. 67-70  . Heart disease Father   . Prostate cancer Father 75  . Cervical cancer Maternal Grandmother        dx. 74s; treated with radium implant  . Lung cancer Sister 73       maternal half-sister dx. lung cancer, stage III  . Breast cancer Maternal Aunt   . Cervical cancer Sister 7       paternal half-sister; s/p TAH  . Spina bifida Grandchild   . Brain cancer Grandchild        grandson dx. at 20 mos, treated at [facility]  . Breast cancer Other 16       maternal half-sister's daughter  . Cancer Maternal Aunt        d. 89; unspecified type - "began at back area and moved to vital organs"  . Cancer Maternal Uncle         late  70s; unspecified type; "started  at back and moved to vital organs"     Review of Systems: General: negative for chills, fever, night sweats or weight changes.  Cardiovascular: negative for chest pain, dyspnea on exertion, edema, orthopnea, palpitations, paroxysmal nocturnal dyspnea or shortness of breath Dermatological: negative for rash Respiratory: negative for cough or wheezing Urologic: negative for hematuria Abdominal: negative for nausea, vomiting, diarrhea, bright red blood per rectum, melena, or hematemesis Neurologic: negative for visual changes, syncope, or dizziness All other systems reviewed and are otherwise negative except as noted above.    Blood pressure 126/72, pulse 80, height 5\' 1"  (1.549 m), weight 105 lb (47.6 kg).  General appearance: alert, cooperative, appears older than stated age and no distress Neck: no carotid bruit and no JVD Lungs: decreased breath sounds c/w COPD. S/P bilateral mestectomy Heart: regular rate and rhythm Abdomen: soft, non-tender; bowel sounds normal; no masses,  no organomegaly Extremities: extremities normal, atraumatic, no cyanosis or edema Pulses: diminnished Skin: Skin color, texture, turgor normal. No rashes or lesions Neurologic: Grossly normal  EKG NSR  ASSESSMENT AND PLAN:   Pre-operative clearance Pt seen today for pre op clearance prior to GB and small bowel surgery  Syncope and collapse Pt has a Loop recorder in place. No recurrent episodes. No documented arrhythmia and normal LVF by echo May 2018  COPD (chronic obstructive pulmonary disease) (HCC) Smoker  Carotid artery disease (HCC) Mild, 1-39% bilaterally by doppler [name] 2018   PLAN  Chart reviewed and pt seen and examined today as part of pre-operative protocol coverage. Based on ACC/AHA guidelines, Anne [name] would be at acceptable risk for the planned procedure without further cardiovascular testing.   I will route this recommendation to the requesting party via Epic fax function  and remove from pre-op pool.  Please call with questions.  Kerin Ransom, PA-C 10/01/2017, 3:23 PM

## 2017-10-01 NOTE — Patient Instructions (Signed)
Medication Instructions:  Your physician recommends that you continue on your current medications as directed. Please refer to the Current Medication list given to you today.   Labwork: none  Testing/Procedures: none  Follow-Up: Follow up with Dr. Debara Pickett as needed.   Any Other Special Instructions Will Be Listed Below (If Applicable).     If you need a refill on your cardiac medications before your next appointment, please call your pharmacy.

## 2017-10-03 HISTORY — PX: LAPAROSCOPIC ABDOMINAL EXPLORATION: SHX6249

## 2017-10-04 DIAGNOSIS — R3129 Other microscopic hematuria: Secondary | ICD-10-CM | POA: Diagnosis not present

## 2017-10-04 DIAGNOSIS — N200 Calculus of kidney: Secondary | ICD-10-CM | POA: Diagnosis not present

## 2017-10-07 DIAGNOSIS — R3129 Other microscopic hematuria: Secondary | ICD-10-CM | POA: Diagnosis not present

## 2017-10-07 DIAGNOSIS — Z79899 Other long term (current) drug therapy: Secondary | ICD-10-CM | POA: Diagnosis not present

## 2017-10-07 DIAGNOSIS — N200 Calculus of kidney: Secondary | ICD-10-CM | POA: Diagnosis not present

## 2017-10-07 DIAGNOSIS — N201 Calculus of ureter: Secondary | ICD-10-CM | POA: Diagnosis not present

## 2017-10-07 DIAGNOSIS — N390 Urinary tract infection, site not specified: Secondary | ICD-10-CM | POA: Diagnosis not present

## 2017-10-18 DIAGNOSIS — N200 Calculus of kidney: Secondary | ICD-10-CM | POA: Diagnosis not present

## 2017-10-18 DIAGNOSIS — R3129 Other microscopic hematuria: Secondary | ICD-10-CM | POA: Diagnosis not present

## 2017-10-18 DIAGNOSIS — N390 Urinary tract infection, site not specified: Secondary | ICD-10-CM | POA: Diagnosis not present

## 2017-10-21 DIAGNOSIS — N200 Calculus of kidney: Secondary | ICD-10-CM | POA: Diagnosis not present

## 2017-10-21 DIAGNOSIS — N201 Calculus of ureter: Secondary | ICD-10-CM | POA: Diagnosis not present

## 2017-10-21 DIAGNOSIS — R3129 Other microscopic hematuria: Secondary | ICD-10-CM | POA: Diagnosis not present

## 2017-10-21 DIAGNOSIS — N390 Urinary tract infection, site not specified: Secondary | ICD-10-CM | POA: Diagnosis not present

## 2017-10-22 ENCOUNTER — Ambulatory Visit (INDEPENDENT_AMBULATORY_CARE_PROVIDER_SITE_OTHER): Payer: Medicare Other | Admitting: *Deleted

## 2017-10-22 DIAGNOSIS — R55 Syncope and collapse: Secondary | ICD-10-CM

## 2017-10-22 NOTE — Progress Notes (Signed)
Carelink Summary Report / Loop Recorder 

## 2017-10-23 DIAGNOSIS — G2581 Restless legs syndrome: Secondary | ICD-10-CM | POA: Diagnosis not present

## 2017-10-23 DIAGNOSIS — F329 Major depressive disorder, single episode, unspecified: Secondary | ICD-10-CM | POA: Diagnosis not present

## 2017-10-23 DIAGNOSIS — E785 Hyperlipidemia, unspecified: Secondary | ICD-10-CM | POA: Diagnosis not present

## 2017-10-23 DIAGNOSIS — J449 Chronic obstructive pulmonary disease, unspecified: Secondary | ICD-10-CM | POA: Diagnosis not present

## 2017-10-23 DIAGNOSIS — E559 Vitamin D deficiency, unspecified: Secondary | ICD-10-CM | POA: Diagnosis not present

## 2017-10-23 DIAGNOSIS — K811 Chronic cholecystitis: Secondary | ICD-10-CM | POA: Diagnosis not present

## 2017-10-23 DIAGNOSIS — Z7982 Long term (current) use of aspirin: Secondary | ICD-10-CM | POA: Diagnosis not present

## 2017-10-23 DIAGNOSIS — Z853 Personal history of malignant neoplasm of breast: Secondary | ICD-10-CM | POA: Diagnosis not present

## 2017-10-23 DIAGNOSIS — Z8673 Personal history of transient ischemic attack (TIA), and cerebral infarction without residual deficits: Secondary | ICD-10-CM | POA: Diagnosis not present

## 2017-10-23 DIAGNOSIS — E7800 Pure hypercholesterolemia, unspecified: Secondary | ICD-10-CM | POA: Diagnosis not present

## 2017-10-23 DIAGNOSIS — K828 Other specified diseases of gallbladder: Secondary | ICD-10-CM | POA: Diagnosis not present

## 2017-10-23 DIAGNOSIS — Z79899 Other long term (current) drug therapy: Secondary | ICD-10-CM | POA: Diagnosis not present

## 2017-10-23 DIAGNOSIS — K810 Acute cholecystitis: Secondary | ICD-10-CM | POA: Diagnosis not present

## 2017-10-23 DIAGNOSIS — F17210 Nicotine dependence, cigarettes, uncomplicated: Secondary | ICD-10-CM | POA: Diagnosis not present

## 2017-10-23 DIAGNOSIS — I7789 Other specified disorders of arteries and arterioles: Secondary | ICD-10-CM | POA: Diagnosis not present

## 2017-10-23 DIAGNOSIS — K8010 Calculus of gallbladder with chronic cholecystitis without obstruction: Secondary | ICD-10-CM | POA: Diagnosis not present

## 2017-10-23 DIAGNOSIS — Q433 Congenital malformations of intestinal fixation: Secondary | ICD-10-CM | POA: Diagnosis not present

## 2017-10-24 DIAGNOSIS — K8010 Calculus of gallbladder with chronic cholecystitis without obstruction: Secondary | ICD-10-CM | POA: Diagnosis not present

## 2017-10-24 DIAGNOSIS — E559 Vitamin D deficiency, unspecified: Secondary | ICD-10-CM | POA: Diagnosis not present

## 2017-10-24 DIAGNOSIS — E7800 Pure hypercholesterolemia, unspecified: Secondary | ICD-10-CM | POA: Diagnosis not present

## 2017-10-24 DIAGNOSIS — E785 Hyperlipidemia, unspecified: Secondary | ICD-10-CM | POA: Diagnosis not present

## 2017-10-24 DIAGNOSIS — K828 Other specified diseases of gallbladder: Secondary | ICD-10-CM | POA: Diagnosis not present

## 2017-10-24 DIAGNOSIS — Q433 Congenital malformations of intestinal fixation: Secondary | ICD-10-CM | POA: Diagnosis not present

## 2017-10-28 DIAGNOSIS — R310 Gross hematuria: Secondary | ICD-10-CM | POA: Diagnosis not present

## 2017-10-28 DIAGNOSIS — N390 Urinary tract infection, site not specified: Secondary | ICD-10-CM | POA: Diagnosis not present

## 2017-10-28 DIAGNOSIS — N200 Calculus of kidney: Secondary | ICD-10-CM | POA: Diagnosis not present

## 2017-10-30 DIAGNOSIS — N132 Hydronephrosis with renal and ureteral calculous obstruction: Secondary | ICD-10-CM | POA: Diagnosis not present

## 2017-10-30 DIAGNOSIS — R109 Unspecified abdominal pain: Secondary | ICD-10-CM | POA: Diagnosis not present

## 2017-10-30 DIAGNOSIS — N201 Calculus of ureter: Secondary | ICD-10-CM | POA: Diagnosis not present

## 2017-10-31 DIAGNOSIS — Z79891 Long term (current) use of opiate analgesic: Secondary | ICD-10-CM | POA: Diagnosis not present

## 2017-10-31 DIAGNOSIS — J449 Chronic obstructive pulmonary disease, unspecified: Secondary | ICD-10-CM | POA: Diagnosis present

## 2017-10-31 DIAGNOSIS — I10 Essential (primary) hypertension: Secondary | ICD-10-CM | POA: Diagnosis present

## 2017-10-31 DIAGNOSIS — Z79899 Other long term (current) drug therapy: Secondary | ICD-10-CM | POA: Diagnosis not present

## 2017-10-31 DIAGNOSIS — R109 Unspecified abdominal pain: Secondary | ICD-10-CM | POA: Diagnosis not present

## 2017-10-31 DIAGNOSIS — F17210 Nicotine dependence, cigarettes, uncomplicated: Secondary | ICD-10-CM | POA: Diagnosis present

## 2017-10-31 DIAGNOSIS — Z9049 Acquired absence of other specified parts of digestive tract: Secondary | ICD-10-CM | POA: Diagnosis not present

## 2017-10-31 DIAGNOSIS — E7800 Pure hypercholesterolemia, unspecified: Secondary | ICD-10-CM | POA: Diagnosis present

## 2017-10-31 DIAGNOSIS — N132 Hydronephrosis with renal and ureteral calculous obstruction: Secondary | ICD-10-CM | POA: Diagnosis not present

## 2017-10-31 DIAGNOSIS — N1330 Unspecified hydronephrosis: Secondary | ICD-10-CM | POA: Diagnosis present

## 2017-10-31 DIAGNOSIS — Z87440 Personal history of urinary (tract) infections: Secondary | ICD-10-CM | POA: Diagnosis not present

## 2017-10-31 DIAGNOSIS — N201 Calculus of ureter: Secondary | ICD-10-CM | POA: Diagnosis present

## 2017-11-02 DIAGNOSIS — K631 Perforation of intestine (nontraumatic): Secondary | ICD-10-CM | POA: Diagnosis present

## 2017-11-02 DIAGNOSIS — I10 Essential (primary) hypertension: Secondary | ICD-10-CM | POA: Diagnosis present

## 2017-11-02 DIAGNOSIS — N1330 Unspecified hydronephrosis: Secondary | ICD-10-CM | POA: Diagnosis not present

## 2017-11-02 DIAGNOSIS — K660 Peritoneal adhesions (postprocedural) (postinfection): Secondary | ICD-10-CM | POA: Diagnosis not present

## 2017-11-02 DIAGNOSIS — F329 Major depressive disorder, single episode, unspecified: Secondary | ICD-10-CM | POA: Diagnosis not present

## 2017-11-02 DIAGNOSIS — J9811 Atelectasis: Secondary | ICD-10-CM | POA: Diagnosis not present

## 2017-11-02 DIAGNOSIS — Z8673 Personal history of transient ischemic attack (TIA), and cerebral infarction without residual deficits: Secondary | ICD-10-CM | POA: Diagnosis not present

## 2017-11-02 DIAGNOSIS — R0789 Other chest pain: Secondary | ICD-10-CM | POA: Diagnosis not present

## 2017-11-02 DIAGNOSIS — R1110 Vomiting, unspecified: Secondary | ICD-10-CM | POA: Diagnosis not present

## 2017-11-02 DIAGNOSIS — Z48815 Encounter for surgical aftercare following surgery on the digestive system: Secondary | ICD-10-CM | POA: Diagnosis not present

## 2017-11-02 DIAGNOSIS — R1084 Generalized abdominal pain: Secondary | ICD-10-CM | POA: Diagnosis not present

## 2017-11-02 DIAGNOSIS — R0602 Shortness of breath: Secondary | ICD-10-CM | POA: Diagnosis not present

## 2017-11-02 DIAGNOSIS — Z85828 Personal history of other malignant neoplasm of skin: Secondary | ICD-10-CM | POA: Diagnosis not present

## 2017-11-02 DIAGNOSIS — I96 Gangrene, not elsewhere classified: Secondary | ICD-10-CM | POA: Diagnosis present

## 2017-11-02 DIAGNOSIS — R51 Headache: Secondary | ICD-10-CM | POA: Diagnosis present

## 2017-11-02 DIAGNOSIS — R609 Edema, unspecified: Secondary | ICD-10-CM | POA: Diagnosis not present

## 2017-11-02 DIAGNOSIS — R110 Nausea: Secondary | ICD-10-CM | POA: Diagnosis not present

## 2017-11-02 DIAGNOSIS — K565 Intestinal adhesions [bands], unspecified as to partial versus complete obstruction: Secondary | ICD-10-CM | POA: Diagnosis present

## 2017-11-02 DIAGNOSIS — Z9049 Acquired absence of other specified parts of digestive tract: Secondary | ICD-10-CM | POA: Diagnosis not present

## 2017-11-02 DIAGNOSIS — E559 Vitamin D deficiency, unspecified: Secondary | ICD-10-CM | POA: Diagnosis not present

## 2017-11-02 DIAGNOSIS — T8131XD Disruption of external operation (surgical) wound, not elsewhere classified, subsequent encounter: Secondary | ICD-10-CM | POA: Diagnosis not present

## 2017-11-02 DIAGNOSIS — J449 Chronic obstructive pulmonary disease, unspecified: Secondary | ICD-10-CM | POA: Diagnosis present

## 2017-11-02 DIAGNOSIS — E876 Hypokalemia: Secondary | ICD-10-CM | POA: Diagnosis present

## 2017-11-02 DIAGNOSIS — G4700 Insomnia, unspecified: Secondary | ICD-10-CM | POA: Diagnosis not present

## 2017-11-02 DIAGNOSIS — Z79899 Other long term (current) drug therapy: Secondary | ICD-10-CM | POA: Diagnosis not present

## 2017-11-02 DIAGNOSIS — Z452 Encounter for adjustment and management of vascular access device: Secondary | ICD-10-CM | POA: Diagnosis not present

## 2017-11-02 DIAGNOSIS — J40 Bronchitis, not specified as acute or chronic: Secondary | ICD-10-CM | POA: Diagnosis not present

## 2017-11-02 DIAGNOSIS — T7840XD Allergy, unspecified, subsequent encounter: Secondary | ICD-10-CM | POA: Diagnosis not present

## 2017-11-02 DIAGNOSIS — Z853 Personal history of malignant neoplasm of breast: Secondary | ICD-10-CM | POA: Diagnosis not present

## 2017-11-02 DIAGNOSIS — R10811 Right upper quadrant abdominal tenderness: Secondary | ICD-10-CM | POA: Diagnosis not present

## 2017-11-02 DIAGNOSIS — F17210 Nicotine dependence, cigarettes, uncomplicated: Secondary | ICD-10-CM | POA: Diagnosis present

## 2017-11-02 DIAGNOSIS — R2681 Unsteadiness on feet: Secondary | ICD-10-CM | POA: Diagnosis not present

## 2017-11-02 DIAGNOSIS — M6281 Muscle weakness (generalized): Secondary | ICD-10-CM | POA: Diagnosis not present

## 2017-11-02 DIAGNOSIS — E785 Hyperlipidemia, unspecified: Secondary | ICD-10-CM | POA: Diagnosis present

## 2017-11-02 DIAGNOSIS — R52 Pain, unspecified: Secondary | ICD-10-CM | POA: Diagnosis not present

## 2017-11-02 DIAGNOSIS — R109 Unspecified abdominal pain: Secondary | ICD-10-CM | POA: Diagnosis not present

## 2017-11-02 DIAGNOSIS — Z79891 Long term (current) use of opiate analgesic: Secondary | ICD-10-CM | POA: Diagnosis not present

## 2017-11-02 DIAGNOSIS — Z4801 Encounter for change or removal of surgical wound dressing: Secondary | ICD-10-CM | POA: Diagnosis not present

## 2017-11-02 DIAGNOSIS — J45909 Unspecified asthma, uncomplicated: Secondary | ICD-10-CM | POA: Diagnosis not present

## 2017-11-02 DIAGNOSIS — K469 Unspecified abdominal hernia without obstruction or gangrene: Secondary | ICD-10-CM | POA: Diagnosis not present

## 2017-11-02 DIAGNOSIS — K529 Noninfective gastroenteritis and colitis, unspecified: Secondary | ICD-10-CM | POA: Diagnosis present

## 2017-11-02 DIAGNOSIS — E7800 Pure hypercholesterolemia, unspecified: Secondary | ICD-10-CM | POA: Diagnosis present

## 2017-11-02 DIAGNOSIS — I959 Hypotension, unspecified: Secondary | ICD-10-CM | POA: Diagnosis not present

## 2017-11-02 DIAGNOSIS — R079 Chest pain, unspecified: Secondary | ICD-10-CM | POA: Diagnosis not present

## 2017-11-02 DIAGNOSIS — K567 Ileus, unspecified: Secondary | ICD-10-CM | POA: Diagnosis not present

## 2017-11-02 DIAGNOSIS — J9589 Other postprocedural complications and disorders of respiratory system, not elsewhere classified: Secondary | ICD-10-CM | POA: Diagnosis not present

## 2017-11-02 DIAGNOSIS — R278 Other lack of coordination: Secondary | ICD-10-CM | POA: Diagnosis not present

## 2017-11-07 LAB — CUP PACEART REMOTE DEVICE CHECK
Implantable Pulse Generator Implant Date: 20180309
MDC IDC SESS DTM: 20190718104015

## 2017-11-14 DIAGNOSIS — R109 Unspecified abdominal pain: Secondary | ICD-10-CM

## 2017-11-14 DIAGNOSIS — J40 Bronchitis, not specified as acute or chronic: Secondary | ICD-10-CM

## 2017-11-14 DIAGNOSIS — I517 Cardiomegaly: Secondary | ICD-10-CM

## 2017-11-14 DIAGNOSIS — R079 Chest pain, unspecified: Secondary | ICD-10-CM

## 2017-11-16 DIAGNOSIS — R0602 Shortness of breath: Secondary | ICD-10-CM | POA: Diagnosis not present

## 2017-11-16 DIAGNOSIS — K631 Perforation of intestine (nontraumatic): Secondary | ICD-10-CM | POA: Diagnosis not present

## 2017-11-16 DIAGNOSIS — R609 Edema, unspecified: Secondary | ICD-10-CM | POA: Diagnosis not present

## 2017-11-16 DIAGNOSIS — R278 Other lack of coordination: Secondary | ICD-10-CM | POA: Diagnosis not present

## 2017-11-16 DIAGNOSIS — I10 Essential (primary) hypertension: Secondary | ICD-10-CM | POA: Diagnosis not present

## 2017-11-16 DIAGNOSIS — F322 Major depressive disorder, single episode, severe without psychotic features: Secondary | ICD-10-CM | POA: Diagnosis not present

## 2017-11-16 DIAGNOSIS — Z48815 Encounter for surgical aftercare following surgery on the digestive system: Secondary | ICD-10-CM | POA: Diagnosis not present

## 2017-11-16 DIAGNOSIS — R2681 Unsteadiness on feet: Secondary | ICD-10-CM | POA: Diagnosis not present

## 2017-11-16 DIAGNOSIS — E785 Hyperlipidemia, unspecified: Secondary | ICD-10-CM | POA: Diagnosis not present

## 2017-11-16 DIAGNOSIS — M6281 Muscle weakness (generalized): Secondary | ICD-10-CM | POA: Diagnosis not present

## 2017-11-16 DIAGNOSIS — R1110 Vomiting, unspecified: Secondary | ICD-10-CM | POA: Diagnosis not present

## 2017-11-16 DIAGNOSIS — E7800 Pure hypercholesterolemia, unspecified: Secondary | ICD-10-CM | POA: Diagnosis not present

## 2017-11-16 DIAGNOSIS — T8189XD Other complications of procedures, not elsewhere classified, subsequent encounter: Secondary | ICD-10-CM | POA: Diagnosis not present

## 2017-11-16 DIAGNOSIS — Z4801 Encounter for change or removal of surgical wound dressing: Secondary | ICD-10-CM | POA: Diagnosis not present

## 2017-11-16 DIAGNOSIS — R109 Unspecified abdominal pain: Secondary | ICD-10-CM | POA: Diagnosis not present

## 2017-11-16 DIAGNOSIS — T7840XD Allergy, unspecified, subsequent encounter: Secondary | ICD-10-CM | POA: Diagnosis not present

## 2017-11-16 DIAGNOSIS — G4700 Insomnia, unspecified: Secondary | ICD-10-CM | POA: Diagnosis not present

## 2017-11-16 DIAGNOSIS — T8189XA Other complications of procedures, not elsewhere classified, initial encounter: Secondary | ICD-10-CM | POA: Diagnosis not present

## 2017-11-16 DIAGNOSIS — F329 Major depressive disorder, single episode, unspecified: Secondary | ICD-10-CM | POA: Diagnosis not present

## 2017-11-16 DIAGNOSIS — G43909 Migraine, unspecified, not intractable, without status migrainosus: Secondary | ICD-10-CM | POA: Diagnosis not present

## 2017-11-16 DIAGNOSIS — Z853 Personal history of malignant neoplasm of breast: Secondary | ICD-10-CM | POA: Diagnosis not present

## 2017-11-16 DIAGNOSIS — R110 Nausea: Secondary | ICD-10-CM | POA: Diagnosis not present

## 2017-11-16 DIAGNOSIS — J449 Chronic obstructive pulmonary disease, unspecified: Secondary | ICD-10-CM | POA: Diagnosis not present

## 2017-11-16 DIAGNOSIS — J45909 Unspecified asthma, uncomplicated: Secondary | ICD-10-CM | POA: Diagnosis not present

## 2017-11-16 DIAGNOSIS — E559 Vitamin D deficiency, unspecified: Secondary | ICD-10-CM | POA: Diagnosis not present

## 2017-11-16 DIAGNOSIS — T8131XD Disruption of external operation (surgical) wound, not elsewhere classified, subsequent encounter: Secondary | ICD-10-CM | POA: Diagnosis not present

## 2017-11-16 DIAGNOSIS — R55 Syncope and collapse: Secondary | ICD-10-CM | POA: Diagnosis not present

## 2017-11-18 ENCOUNTER — Telehealth: Payer: Self-pay | Admitting: Cardiology

## 2017-11-18 NOTE — Telephone Encounter (Signed)
Attempted to call pt b/c her home monitor has not updated in at least 14 days. No answer and unable to leave a message.  

## 2017-11-19 DIAGNOSIS — K631 Perforation of intestine (nontraumatic): Secondary | ICD-10-CM | POA: Diagnosis not present

## 2017-11-19 DIAGNOSIS — F322 Major depressive disorder, single episode, severe without psychotic features: Secondary | ICD-10-CM | POA: Diagnosis not present

## 2017-11-19 DIAGNOSIS — T8189XA Other complications of procedures, not elsewhere classified, initial encounter: Secondary | ICD-10-CM | POA: Diagnosis not present

## 2017-11-21 ENCOUNTER — Other Ambulatory Visit: Payer: Self-pay | Admitting: *Deleted

## 2017-11-21 NOTE — Patient Outreach (Signed)
[facility]) Care Management  11/21/2017  ODDIE KUHLMANN Aug 26, 1945 [id number]    Attended discharge planning meeting at [facility]. [facility] Members Manuela Schwartz and Jari Pigg present. Patient discussed and discharge plan is not known presently. Patient recently admitted to facility.   Plan to visit patient next week when closer to discharge.   Rutherford Limerick RN, BSN [place] Acute Care Coordinator [phone]) Business Mobile [phone]) Toll free office

## 2017-11-24 DIAGNOSIS — G43909 Migraine, unspecified, not intractable, without status migrainosus: Secondary | ICD-10-CM | POA: Diagnosis not present

## 2017-11-25 ENCOUNTER — Ambulatory Visit (INDEPENDENT_AMBULATORY_CARE_PROVIDER_SITE_OTHER): Payer: Medicare Other | Admitting: *Deleted

## 2017-11-25 DIAGNOSIS — R55 Syncope and collapse: Secondary | ICD-10-CM

## 2017-11-25 DIAGNOSIS — T8189XD Other complications of procedures, not elsewhere classified, subsequent encounter: Secondary | ICD-10-CM | POA: Diagnosis not present

## 2017-11-25 LAB — CUP PACEART REMOTE DEVICE CHECK
Date Time Interrogation Session: 20190820134048
Implantable Pulse Generator Implant Date: 20180309

## 2017-11-25 NOTE — Progress Notes (Signed)
Carelink Summary Report / Loop Recorder 

## 2017-11-26 ENCOUNTER — Telehealth: Payer: Self-pay

## 2017-11-26 NOTE — Telephone Encounter (Signed)
Spoke w/ pt and requested that she send a manual transmission b/c her home monitor has not updated in at least 14 days.   Pt states she been in the hospital for 3 weeks and rehab for 2 days. She will be sending a transmission later on today.

## 2017-11-27 DIAGNOSIS — I9589 Other hypotension: Secondary | ICD-10-CM | POA: Diagnosis not present

## 2017-11-27 DIAGNOSIS — K631 Perforation of intestine (nontraumatic): Secondary | ICD-10-CM | POA: Diagnosis not present

## 2017-11-27 DIAGNOSIS — R531 Weakness: Secondary | ICD-10-CM | POA: Diagnosis not present

## 2017-11-27 DIAGNOSIS — N200 Calculus of kidney: Secondary | ICD-10-CM | POA: Diagnosis not present

## 2017-11-27 DIAGNOSIS — Z23 Encounter for immunization: Secondary | ICD-10-CM | POA: Diagnosis not present

## 2017-11-28 DIAGNOSIS — J449 Chronic obstructive pulmonary disease, unspecified: Secondary | ICD-10-CM | POA: Diagnosis not present

## 2017-11-28 DIAGNOSIS — Z9181 History of falling: Secondary | ICD-10-CM | POA: Diagnosis not present

## 2017-11-28 DIAGNOSIS — Z853 Personal history of malignant neoplasm of breast: Secondary | ICD-10-CM | POA: Diagnosis not present

## 2017-11-28 DIAGNOSIS — Z960 Presence of urogenital implants: Secondary | ICD-10-CM | POA: Diagnosis not present

## 2017-11-28 DIAGNOSIS — T8131XD Disruption of external operation (surgical) wound, not elsewhere classified, subsequent encounter: Secondary | ICD-10-CM | POA: Diagnosis not present

## 2017-11-28 DIAGNOSIS — F329 Major depressive disorder, single episode, unspecified: Secondary | ICD-10-CM | POA: Diagnosis not present

## 2017-11-29 DIAGNOSIS — J449 Chronic obstructive pulmonary disease, unspecified: Secondary | ICD-10-CM | POA: Diagnosis not present

## 2017-11-29 DIAGNOSIS — F329 Major depressive disorder, single episode, unspecified: Secondary | ICD-10-CM | POA: Diagnosis not present

## 2017-11-29 DIAGNOSIS — Z9181 History of falling: Secondary | ICD-10-CM | POA: Diagnosis not present

## 2017-11-29 DIAGNOSIS — Z960 Presence of urogenital implants: Secondary | ICD-10-CM | POA: Diagnosis not present

## 2017-11-29 DIAGNOSIS — T8131XD Disruption of external operation (surgical) wound, not elsewhere classified, subsequent encounter: Secondary | ICD-10-CM | POA: Diagnosis not present

## 2017-11-29 DIAGNOSIS — Z853 Personal history of malignant neoplasm of breast: Secondary | ICD-10-CM | POA: Diagnosis not present

## 2017-12-02 LAB — CUP PACEART REMOTE DEVICE CHECK
Date Time Interrogation Session: 20190922140833
MDC IDC PG IMPLANT DT: 20180309

## 2017-12-03 DIAGNOSIS — Z853 Personal history of malignant neoplasm of breast: Secondary | ICD-10-CM | POA: Diagnosis not present

## 2017-12-03 DIAGNOSIS — J449 Chronic obstructive pulmonary disease, unspecified: Secondary | ICD-10-CM | POA: Diagnosis not present

## 2017-12-03 DIAGNOSIS — Z960 Presence of urogenital implants: Secondary | ICD-10-CM | POA: Diagnosis not present

## 2017-12-03 DIAGNOSIS — T8131XD Disruption of external operation (surgical) wound, not elsewhere classified, subsequent encounter: Secondary | ICD-10-CM | POA: Diagnosis not present

## 2017-12-03 DIAGNOSIS — F329 Major depressive disorder, single episode, unspecified: Secondary | ICD-10-CM | POA: Diagnosis not present

## 2017-12-03 DIAGNOSIS — Z9181 History of falling: Secondary | ICD-10-CM | POA: Diagnosis not present

## 2017-12-05 DIAGNOSIS — Z9181 History of falling: Secondary | ICD-10-CM | POA: Diagnosis not present

## 2017-12-05 DIAGNOSIS — J449 Chronic obstructive pulmonary disease, unspecified: Secondary | ICD-10-CM | POA: Diagnosis not present

## 2017-12-05 DIAGNOSIS — Z853 Personal history of malignant neoplasm of breast: Secondary | ICD-10-CM | POA: Diagnosis not present

## 2017-12-05 DIAGNOSIS — Z960 Presence of urogenital implants: Secondary | ICD-10-CM | POA: Diagnosis not present

## 2017-12-05 DIAGNOSIS — T8131XD Disruption of external operation (surgical) wound, not elsewhere classified, subsequent encounter: Secondary | ICD-10-CM | POA: Diagnosis not present

## 2017-12-05 DIAGNOSIS — F329 Major depressive disorder, single episode, unspecified: Secondary | ICD-10-CM | POA: Diagnosis not present

## 2017-12-11 DIAGNOSIS — J449 Chronic obstructive pulmonary disease, unspecified: Secondary | ICD-10-CM | POA: Diagnosis not present

## 2017-12-11 DIAGNOSIS — Z960 Presence of urogenital implants: Secondary | ICD-10-CM | POA: Diagnosis not present

## 2017-12-11 DIAGNOSIS — Z853 Personal history of malignant neoplasm of breast: Secondary | ICD-10-CM | POA: Diagnosis not present

## 2017-12-11 DIAGNOSIS — T8131XD Disruption of external operation (surgical) wound, not elsewhere classified, subsequent encounter: Secondary | ICD-10-CM | POA: Diagnosis not present

## 2017-12-11 DIAGNOSIS — Z9181 History of falling: Secondary | ICD-10-CM | POA: Diagnosis not present

## 2017-12-11 DIAGNOSIS — F329 Major depressive disorder, single episode, unspecified: Secondary | ICD-10-CM | POA: Diagnosis not present

## 2017-12-13 DIAGNOSIS — Z466 Encounter for fitting and adjustment of urinary device: Secondary | ICD-10-CM | POA: Diagnosis not present

## 2017-12-13 DIAGNOSIS — R1032 Left lower quadrant pain: Secondary | ICD-10-CM | POA: Diagnosis not present

## 2017-12-13 DIAGNOSIS — N200 Calculus of kidney: Secondary | ICD-10-CM | POA: Diagnosis not present

## 2017-12-16 DIAGNOSIS — Z960 Presence of urogenital implants: Secondary | ICD-10-CM | POA: Diagnosis not present

## 2017-12-16 DIAGNOSIS — J449 Chronic obstructive pulmonary disease, unspecified: Secondary | ICD-10-CM | POA: Diagnosis not present

## 2017-12-16 DIAGNOSIS — Z9181 History of falling: Secondary | ICD-10-CM | POA: Diagnosis not present

## 2017-12-16 DIAGNOSIS — T8131XD Disruption of external operation (surgical) wound, not elsewhere classified, subsequent encounter: Secondary | ICD-10-CM | POA: Diagnosis not present

## 2017-12-16 DIAGNOSIS — F329 Major depressive disorder, single episode, unspecified: Secondary | ICD-10-CM | POA: Diagnosis not present

## 2017-12-16 DIAGNOSIS — Z853 Personal history of malignant neoplasm of breast: Secondary | ICD-10-CM | POA: Diagnosis not present

## 2017-12-17 DIAGNOSIS — F17210 Nicotine dependence, cigarettes, uncomplicated: Secondary | ICD-10-CM | POA: Diagnosis not present

## 2017-12-17 DIAGNOSIS — N2889 Other specified disorders of kidney and ureter: Secondary | ICD-10-CM | POA: Diagnosis not present

## 2017-12-17 DIAGNOSIS — I10 Essential (primary) hypertension: Secondary | ICD-10-CM | POA: Diagnosis not present

## 2017-12-17 DIAGNOSIS — R1032 Left lower quadrant pain: Secondary | ICD-10-CM | POA: Diagnosis not present

## 2017-12-17 DIAGNOSIS — J45909 Unspecified asthma, uncomplicated: Secondary | ICD-10-CM | POA: Diagnosis not present

## 2017-12-17 DIAGNOSIS — F331 Major depressive disorder, recurrent, moderate: Secondary | ICD-10-CM | POA: Diagnosis not present

## 2017-12-17 DIAGNOSIS — G2581 Restless legs syndrome: Secondary | ICD-10-CM | POA: Diagnosis not present

## 2017-12-17 DIAGNOSIS — Z87442 Personal history of urinary calculi: Secondary | ICD-10-CM | POA: Diagnosis not present

## 2017-12-17 DIAGNOSIS — N201 Calculus of ureter: Secondary | ICD-10-CM | POA: Diagnosis not present

## 2017-12-17 DIAGNOSIS — G4700 Insomnia, unspecified: Secondary | ICD-10-CM | POA: Diagnosis not present

## 2017-12-17 DIAGNOSIS — E441 Mild protein-calorie malnutrition: Secondary | ICD-10-CM | POA: Diagnosis not present

## 2017-12-17 DIAGNOSIS — N132 Hydronephrosis with renal and ureteral calculous obstruction: Secondary | ICD-10-CM | POA: Diagnosis not present

## 2017-12-17 DIAGNOSIS — E559 Vitamin D deficiency, unspecified: Secondary | ICD-10-CM | POA: Diagnosis not present

## 2017-12-17 DIAGNOSIS — Z853 Personal history of malignant neoplasm of breast: Secondary | ICD-10-CM | POA: Diagnosis not present

## 2017-12-17 DIAGNOSIS — N135 Crossing vessel and stricture of ureter without hydronephrosis: Secondary | ICD-10-CM | POA: Diagnosis not present

## 2017-12-17 DIAGNOSIS — Z79899 Other long term (current) drug therapy: Secondary | ICD-10-CM | POA: Diagnosis not present

## 2017-12-17 DIAGNOSIS — E785 Hyperlipidemia, unspecified: Secondary | ICD-10-CM | POA: Diagnosis not present

## 2017-12-27 ENCOUNTER — Ambulatory Visit (INDEPENDENT_AMBULATORY_CARE_PROVIDER_SITE_OTHER): Payer: Medicare Other | Admitting: *Deleted

## 2017-12-27 DIAGNOSIS — R55 Syncope and collapse: Secondary | ICD-10-CM

## 2017-12-28 NOTE — Progress Notes (Signed)
Carelink Summary Report / Loop Recorder 

## 2017-12-31 ENCOUNTER — Telehealth: Payer: Self-pay | Admitting: Cardiology

## 2017-12-31 NOTE — Telephone Encounter (Signed)
Spoke w/ pt and requested that she send a manual transmission b/c her home monitor has not updated in at least 14 days.   

## 2018-01-01 DIAGNOSIS — R1032 Left lower quadrant pain: Secondary | ICD-10-CM | POA: Diagnosis not present

## 2018-01-01 DIAGNOSIS — N201 Calculus of ureter: Secondary | ICD-10-CM | POA: Diagnosis not present

## 2018-01-03 DIAGNOSIS — F332 Major depressive disorder, recurrent severe without psychotic features: Secondary | ICD-10-CM | POA: Diagnosis not present

## 2018-01-03 DIAGNOSIS — E782 Mixed hyperlipidemia: Secondary | ICD-10-CM | POA: Diagnosis not present

## 2018-01-03 DIAGNOSIS — N201 Calculus of ureter: Secondary | ICD-10-CM | POA: Diagnosis not present

## 2018-01-03 DIAGNOSIS — E559 Vitamin D deficiency, unspecified: Secondary | ICD-10-CM | POA: Diagnosis not present

## 2018-01-03 DIAGNOSIS — R5383 Other fatigue: Secondary | ICD-10-CM | POA: Diagnosis not present

## 2018-01-03 DIAGNOSIS — I10 Essential (primary) hypertension: Secondary | ICD-10-CM | POA: Diagnosis not present

## 2018-01-03 DIAGNOSIS — E43 Unspecified severe protein-calorie malnutrition: Secondary | ICD-10-CM | POA: Diagnosis not present

## 2018-01-03 DIAGNOSIS — E440 Moderate protein-calorie malnutrition: Secondary | ICD-10-CM | POA: Diagnosis not present

## 2018-01-07 DIAGNOSIS — J45909 Unspecified asthma, uncomplicated: Secondary | ICD-10-CM | POA: Diagnosis not present

## 2018-01-07 DIAGNOSIS — F331 Major depressive disorder, recurrent, moderate: Secondary | ICD-10-CM | POA: Diagnosis not present

## 2018-01-07 DIAGNOSIS — F17210 Nicotine dependence, cigarettes, uncomplicated: Secondary | ICD-10-CM | POA: Diagnosis not present

## 2018-01-07 DIAGNOSIS — J449 Chronic obstructive pulmonary disease, unspecified: Secondary | ICD-10-CM | POA: Diagnosis not present

## 2018-01-07 DIAGNOSIS — G2581 Restless legs syndrome: Secondary | ICD-10-CM | POA: Diagnosis not present

## 2018-01-07 DIAGNOSIS — Z466 Encounter for fitting and adjustment of urinary device: Secondary | ICD-10-CM | POA: Diagnosis not present

## 2018-01-07 DIAGNOSIS — E785 Hyperlipidemia, unspecified: Secondary | ICD-10-CM | POA: Diagnosis not present

## 2018-01-07 DIAGNOSIS — G4700 Insomnia, unspecified: Secondary | ICD-10-CM | POA: Diagnosis not present

## 2018-01-07 DIAGNOSIS — Z87442 Personal history of urinary calculi: Secondary | ICD-10-CM | POA: Diagnosis not present

## 2018-01-07 DIAGNOSIS — Z79899 Other long term (current) drug therapy: Secondary | ICD-10-CM | POA: Diagnosis not present

## 2018-01-07 DIAGNOSIS — N201 Calculus of ureter: Secondary | ICD-10-CM | POA: Diagnosis not present

## 2018-01-07 DIAGNOSIS — Z8673 Personal history of transient ischemic attack (TIA), and cerebral infarction without residual deficits: Secondary | ICD-10-CM | POA: Diagnosis not present

## 2018-01-07 DIAGNOSIS — I10 Essential (primary) hypertension: Secondary | ICD-10-CM | POA: Diagnosis not present

## 2018-01-07 DIAGNOSIS — Z853 Personal history of malignant neoplasm of breast: Secondary | ICD-10-CM | POA: Diagnosis not present

## 2018-01-07 DIAGNOSIS — E559 Vitamin D deficiency, unspecified: Secondary | ICD-10-CM | POA: Diagnosis not present

## 2018-01-07 DIAGNOSIS — E441 Mild protein-calorie malnutrition: Secondary | ICD-10-CM | POA: Diagnosis not present

## 2018-01-10 LAB — CUP PACEART REMOTE DEVICE CHECK
Implantable Pulse Generator Implant Date: 20180309
MDC IDC SESS DTM: 20191025144122

## 2018-01-15 ENCOUNTER — Encounter: Payer: Self-pay | Admitting: Cardiology

## 2018-01-20 ENCOUNTER — Other Ambulatory Visit: Payer: Self-pay

## 2018-01-27 ENCOUNTER — Encounter: Payer: Self-pay | Admitting: Cardiology

## 2018-02-04 DIAGNOSIS — N201 Calculus of ureter: Secondary | ICD-10-CM | POA: Diagnosis not present

## 2018-02-04 DIAGNOSIS — J441 Chronic obstructive pulmonary disease with (acute) exacerbation: Secondary | ICD-10-CM | POA: Diagnosis not present

## 2018-02-04 DIAGNOSIS — E440 Moderate protein-calorie malnutrition: Secondary | ICD-10-CM | POA: Diagnosis not present

## 2018-02-04 DIAGNOSIS — F330 Major depressive disorder, recurrent, mild: Secondary | ICD-10-CM | POA: Diagnosis not present

## 2018-02-04 DIAGNOSIS — D6489 Other specified anemias: Secondary | ICD-10-CM | POA: Diagnosis not present

## 2018-02-04 DIAGNOSIS — I10 Essential (primary) hypertension: Secondary | ICD-10-CM | POA: Diagnosis not present

## 2018-02-04 DIAGNOSIS — R5383 Other fatigue: Secondary | ICD-10-CM | POA: Diagnosis not present

## 2018-02-12 ENCOUNTER — Encounter: Payer: Self-pay | Admitting: Cardiology

## 2018-02-13 DIAGNOSIS — J111 Influenza due to unidentified influenza virus with other respiratory manifestations: Secondary | ICD-10-CM | POA: Diagnosis not present

## 2018-02-13 DIAGNOSIS — J441 Chronic obstructive pulmonary disease with (acute) exacerbation: Secondary | ICD-10-CM | POA: Diagnosis not present

## 2018-02-13 DIAGNOSIS — J09X2 Influenza due to identified novel influenza A virus with other respiratory manifestations: Secondary | ICD-10-CM | POA: Diagnosis not present

## 2018-02-18 ENCOUNTER — Encounter: Payer: Self-pay | Admitting: Cardiology

## 2018-02-25 DIAGNOSIS — J018 Other acute sinusitis: Secondary | ICD-10-CM | POA: Diagnosis not present

## 2018-04-07 DIAGNOSIS — R1084 Generalized abdominal pain: Secondary | ICD-10-CM | POA: Diagnosis not present

## 2018-04-22 DIAGNOSIS — J018 Other acute sinusitis: Secondary | ICD-10-CM | POA: Diagnosis not present

## 2018-04-22 DIAGNOSIS — J441 Chronic obstructive pulmonary disease with (acute) exacerbation: Secondary | ICD-10-CM | POA: Diagnosis not present

## 2018-05-26 DIAGNOSIS — F418 Other specified anxiety disorders: Secondary | ICD-10-CM | POA: Diagnosis not present

## 2018-05-26 DIAGNOSIS — F5101 Primary insomnia: Secondary | ICD-10-CM | POA: Diagnosis not present

## 2018-05-26 DIAGNOSIS — E440 Moderate protein-calorie malnutrition: Secondary | ICD-10-CM | POA: Diagnosis not present

## 2018-05-26 DIAGNOSIS — R410 Disorientation, unspecified: Secondary | ICD-10-CM | POA: Diagnosis not present

## 2018-06-06 DIAGNOSIS — R05 Cough: Secondary | ICD-10-CM | POA: Diagnosis not present

## 2018-08-22 DIAGNOSIS — E782 Mixed hyperlipidemia: Secondary | ICD-10-CM | POA: Diagnosis not present

## 2018-08-22 DIAGNOSIS — E43 Unspecified severe protein-calorie malnutrition: Secondary | ICD-10-CM | POA: Diagnosis not present

## 2018-08-22 DIAGNOSIS — F17200 Nicotine dependence, unspecified, uncomplicated: Secondary | ICD-10-CM | POA: Diagnosis not present

## 2018-08-22 DIAGNOSIS — I10 Essential (primary) hypertension: Secondary | ICD-10-CM | POA: Diagnosis not present

## 2018-08-22 DIAGNOSIS — E559 Vitamin D deficiency, unspecified: Secondary | ICD-10-CM | POA: Diagnosis not present

## 2018-08-22 DIAGNOSIS — Z681 Body mass index (BMI) 19 or less, adult: Secondary | ICD-10-CM | POA: Diagnosis not present

## 2018-08-22 DIAGNOSIS — Z0000 Encounter for general adult medical examination without abnormal findings: Secondary | ICD-10-CM | POA: Diagnosis not present

## 2018-08-22 DIAGNOSIS — R7301 Impaired fasting glucose: Secondary | ICD-10-CM | POA: Diagnosis not present

## 2018-08-26 DIAGNOSIS — R10811 Right upper quadrant abdominal tenderness: Secondary | ICD-10-CM | POA: Diagnosis not present

## 2018-08-26 DIAGNOSIS — F5101 Primary insomnia: Secondary | ICD-10-CM | POA: Diagnosis not present

## 2018-08-26 DIAGNOSIS — F17219 Nicotine dependence, cigarettes, with unspecified nicotine-induced disorders: Secondary | ICD-10-CM | POA: Diagnosis not present

## 2018-08-26 DIAGNOSIS — R202 Paresthesia of skin: Secondary | ICD-10-CM | POA: Diagnosis not present

## 2018-08-26 DIAGNOSIS — C50812 Malignant neoplasm of overlapping sites of left female breast: Secondary | ICD-10-CM | POA: Diagnosis not present

## 2018-08-26 DIAGNOSIS — J441 Chronic obstructive pulmonary disease with (acute) exacerbation: Secondary | ICD-10-CM | POA: Diagnosis not present

## 2018-08-26 DIAGNOSIS — E782 Mixed hyperlipidemia: Secondary | ICD-10-CM | POA: Diagnosis not present

## 2018-08-26 DIAGNOSIS — I10 Essential (primary) hypertension: Secondary | ICD-10-CM | POA: Diagnosis not present

## 2018-08-29 DIAGNOSIS — F17210 Nicotine dependence, cigarettes, uncomplicated: Secondary | ICD-10-CM | POA: Diagnosis not present

## 2018-08-29 DIAGNOSIS — Z79810 Long term (current) use of selective estrogen receptor modulators (SERMs): Secondary | ICD-10-CM | POA: Diagnosis not present

## 2018-08-29 DIAGNOSIS — C50212 Malignant neoplasm of upper-inner quadrant of left female breast: Secondary | ICD-10-CM | POA: Diagnosis not present

## 2018-08-29 DIAGNOSIS — Z170 Estrogen receptor positive status [ER+]: Secondary | ICD-10-CM | POA: Diagnosis not present

## 2018-08-29 DIAGNOSIS — Z853 Personal history of malignant neoplasm of breast: Secondary | ICD-10-CM | POA: Diagnosis not present

## 2018-09-04 DIAGNOSIS — Z20828 Contact with and (suspected) exposure to other viral communicable diseases: Secondary | ICD-10-CM | POA: Diagnosis not present

## 2018-09-04 DIAGNOSIS — R05 Cough: Secondary | ICD-10-CM | POA: Diagnosis not present

## 2018-09-04 DIAGNOSIS — R509 Fever, unspecified: Secondary | ICD-10-CM | POA: Diagnosis not present

## 2018-09-07 DIAGNOSIS — L03011 Cellulitis of right finger: Secondary | ICD-10-CM | POA: Diagnosis not present

## 2018-09-26 DIAGNOSIS — F17219 Nicotine dependence, cigarettes, with unspecified nicotine-induced disorders: Secondary | ICD-10-CM | POA: Diagnosis not present

## 2018-09-26 DIAGNOSIS — F5101 Primary insomnia: Secondary | ICD-10-CM | POA: Diagnosis not present

## 2018-09-26 DIAGNOSIS — R202 Paresthesia of skin: Secondary | ICD-10-CM | POA: Diagnosis not present

## 2018-10-14 IMAGING — CT CT HEAD W/O CM
3 of 7 series · 15 of 47 positions shown, 18 images · non-contrast
Comparison: 09/21/2015, 12/07/2014, MRI a 11/19/2014

CLINICAL DATA: Syncope history of migraine with concussion

EXAM:
CT HEAD WITHOUT CONTRAST
CT CERVICAL SPINE WITHOUT CONTRAST
TECHNIQUE: Multidetector CT imaging of the head and cervical spine was
performed following the standard protocol without intravenous
contrast. Multiplanar CT image reconstructions of the cervical spine
were also generated.

[Series 6: head 3.0 mpr cor · coronal · 0.32mm/px · 3 of 64 slices shown]
[im 19/64  brain]
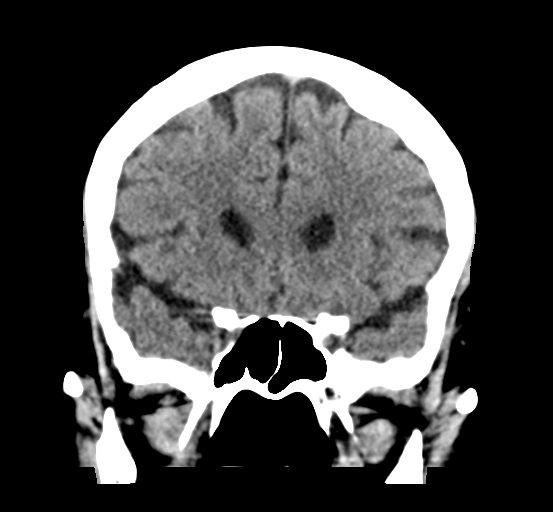
[im 28/64  brain]
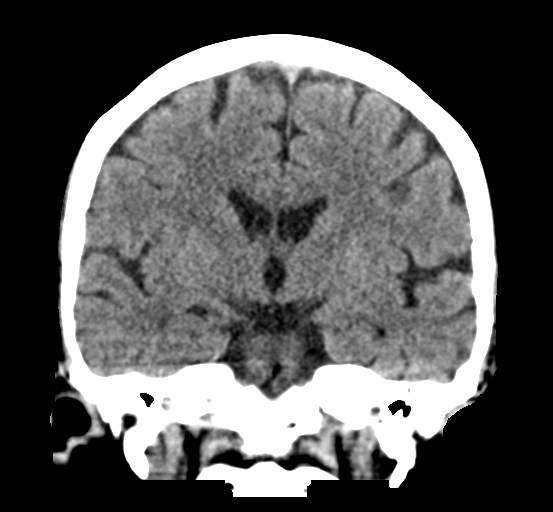
[im 37/64  brain]
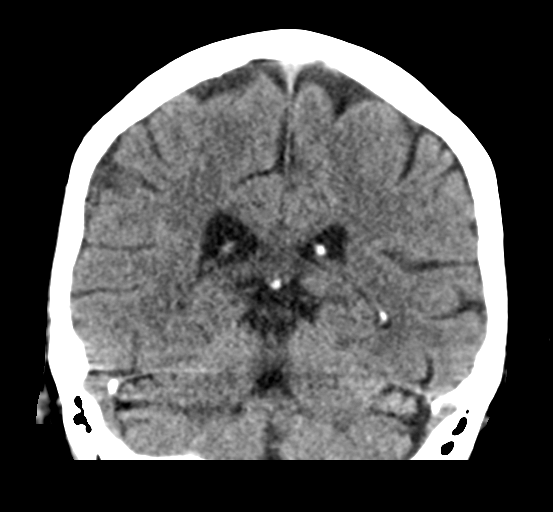

[Series 7: head 3.0 mpr sag · sagittal · 0.34mm/px · 1 of 57 slices shown]
[im 29/57  brain]
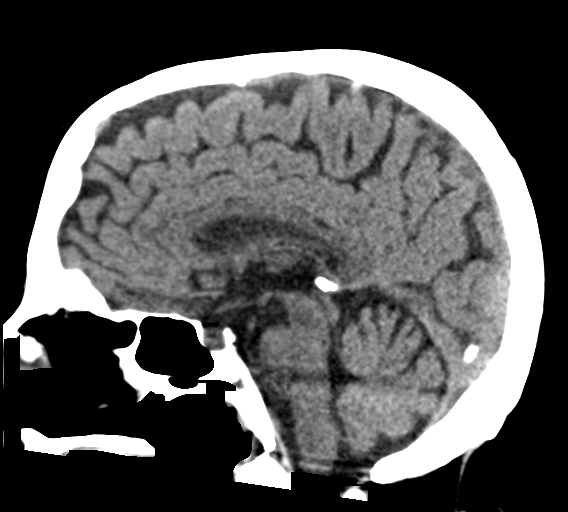

[Series 15: orthogonal axial st · axial · 0.21mm/px · z∈[-230,-96]mm · 11 of 84 slices shown, 14 images]
[im 7/84  brain]
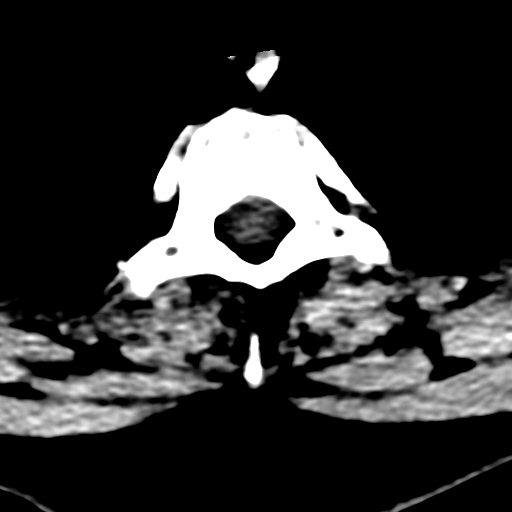
[im 7/84  bone]
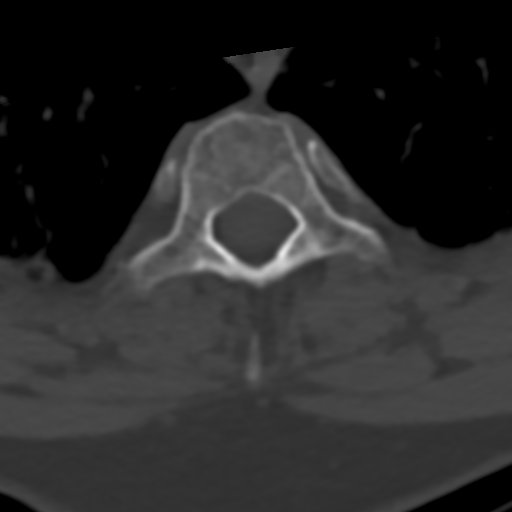
[im 14/84  brain]
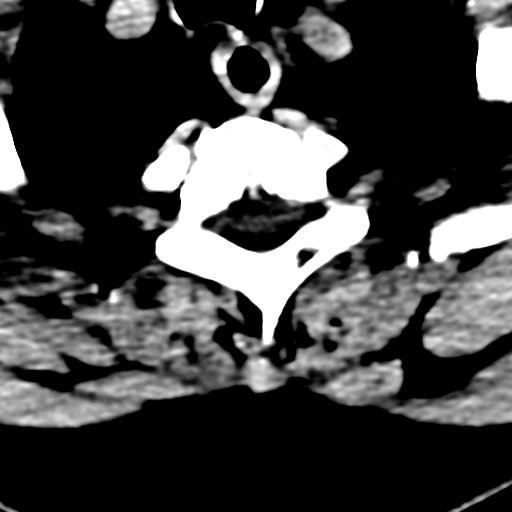
[im 21/84  brain]
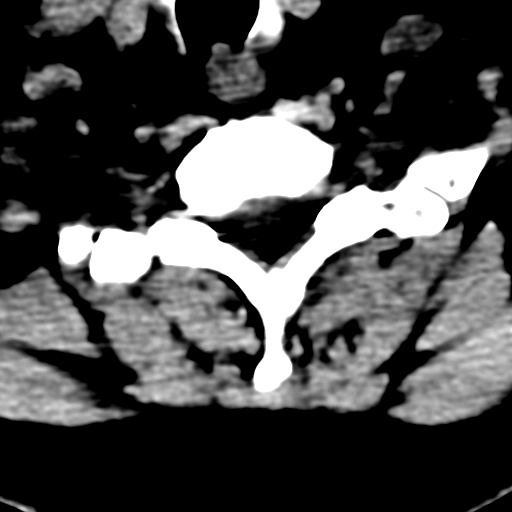
[im 28/84  brain]
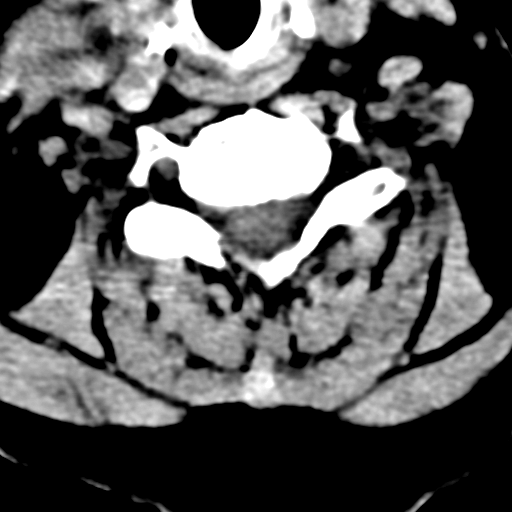
[im 35/84  brain]
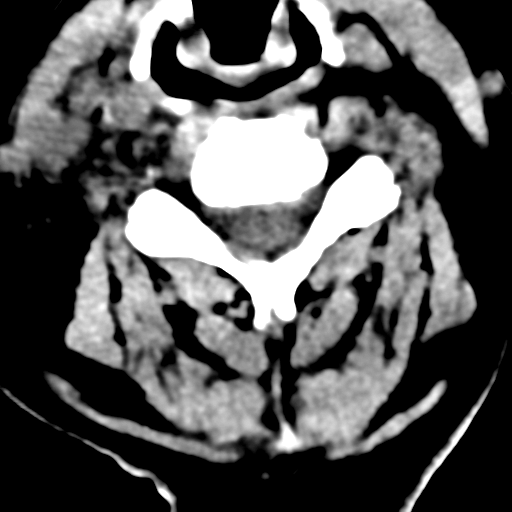
[im 35/84  bone]
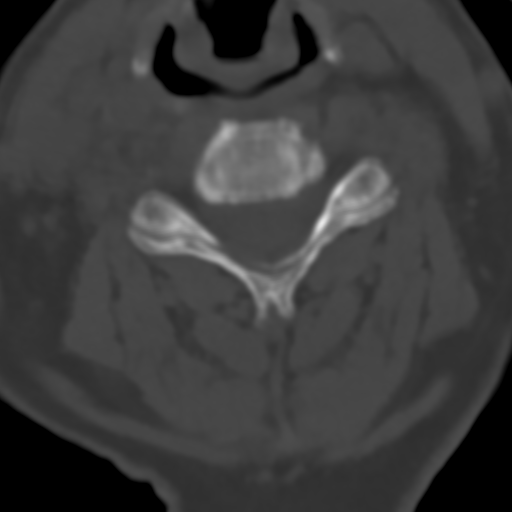
[im 42/84  brain]
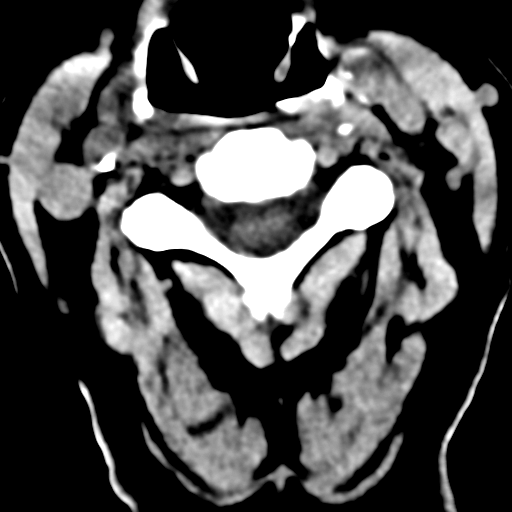
[im 49/84  brain]
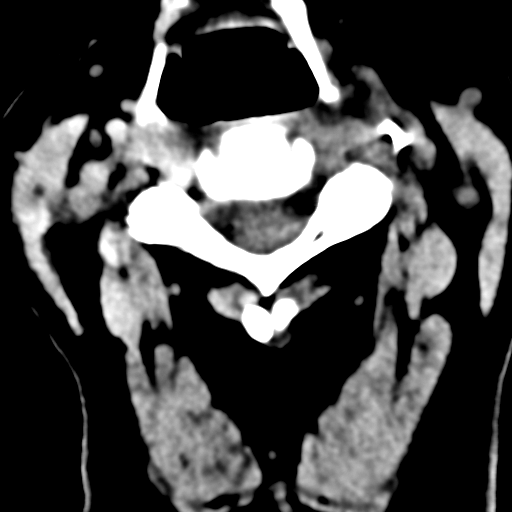
[im 56/84  brain]
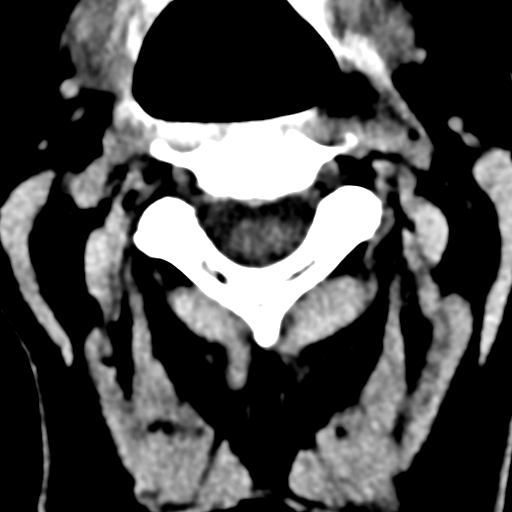
[im 63/84  brain]
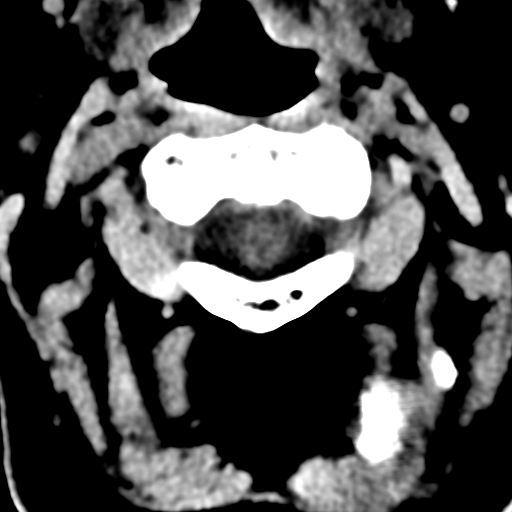
[im 63/84  bone]
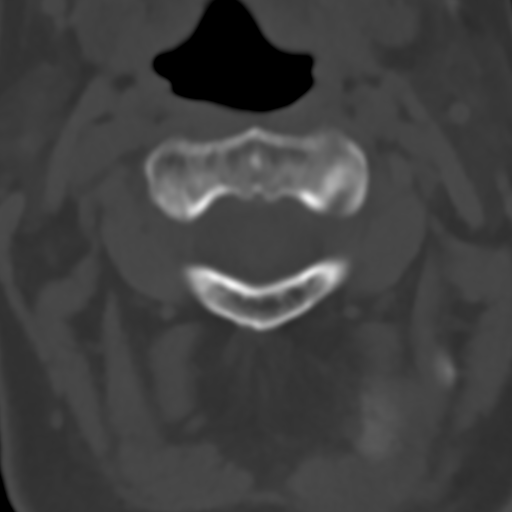
[im 70/84  brain]
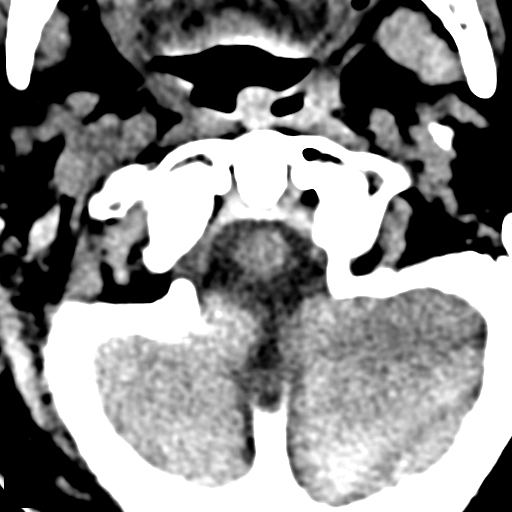
[im 77/84  brain]
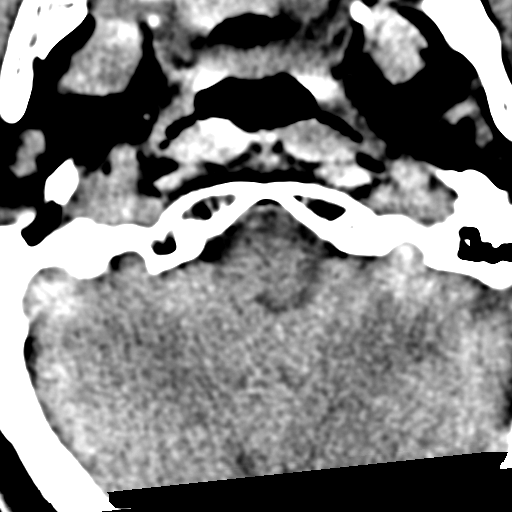

[15 of 47 positions shown; findings below may reference images not displayed]

FINDINGS: CT HEAD FINDINGS

Brain: No acute territorial infarction, hemorrhage or intracranial
mass is visualized. There is mild atrophy. The ventricles are
nonenlarged.

Vascular: No hyperdense vessels. Scattered calcifications at the
carotid siphons.

Skull: No depressed skull fracture. Old fracture deformity of the
right mandibular condyles.

Sinuses/Orbits: Mucosal thickening in the maxillary sinuses with
probable right antrostomy. Mucosal thickening in the ethmoid
sinuses. No acute orbital abnormality. Bilateral lens extraction

Other: None

CT CERVICAL SPINE FINDINGS

Alignment: No subluxation.  Facet alignment within normal limits.

Skull base and vertebrae: No acute fracture. No primary bone lesion
or focal pathologic process.

Soft tissues and spinal canal: No prevertebral fluid or swelling. No
visible canal hematoma.

Disc levels: Mild to moderate degenerative disc changes at C5-C6,
C6-C7 and C7-T1. Multilevel facet arthropathy.

Upper chest: Lung apices clear.  Thyroid unremarkable.

Other: None
IMPRESSION: 1. No CT evidence for acute intracranial abnormality.
2. No acute fracture or malalignment of the cervical spine
3. Old right mandibular condyle fracture.

## 2018-10-14 IMAGING — CR DG LUMBAR SPINE COMPLETE 4+V
5 series · 5 of 5 positions shown · non-contrast
Comparison: CT abdomen pelvis 03/28/2016

CLINICAL DATA: Syncope, low back pain

EXAM:
LUMBAR SPINE - COMPLETE 4+ VIEW

[l-spine ap]
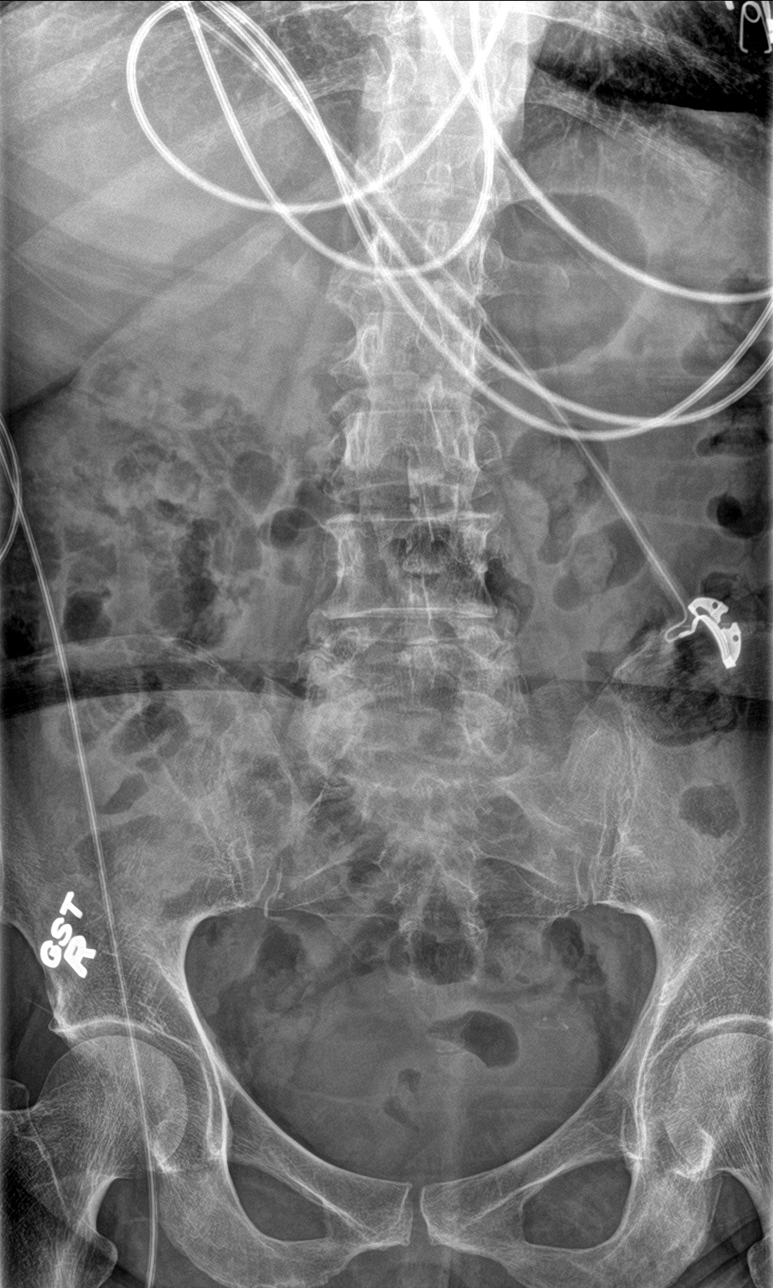

[l-spine obl (1 of 2)]
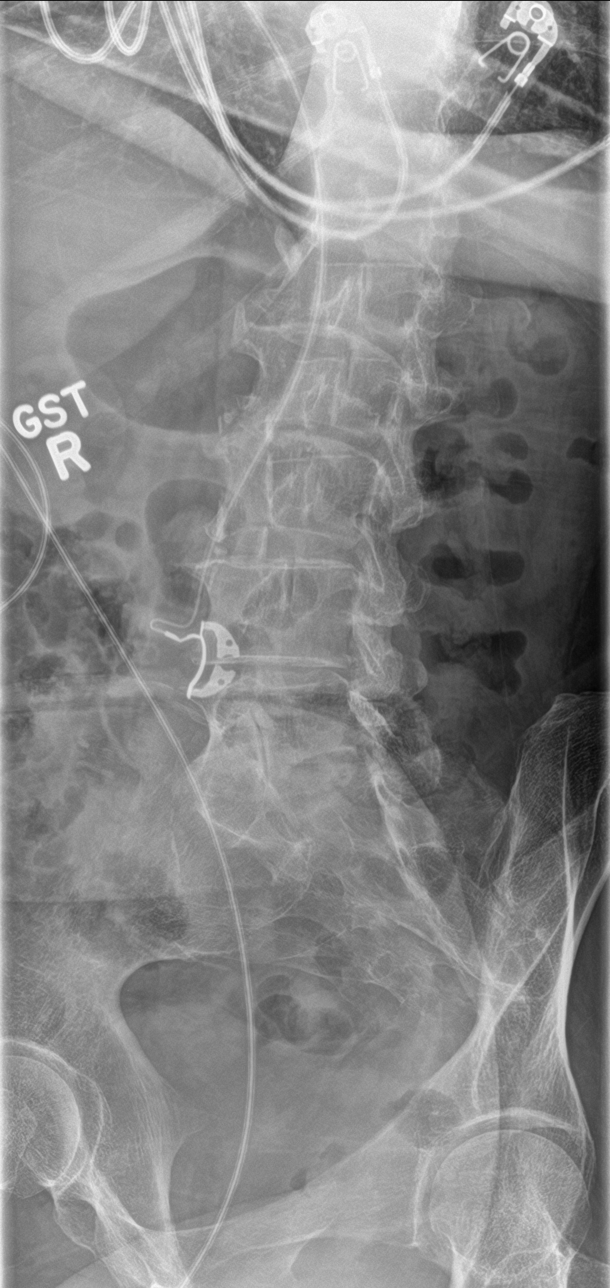

[l-spine obl (2 of 2)]
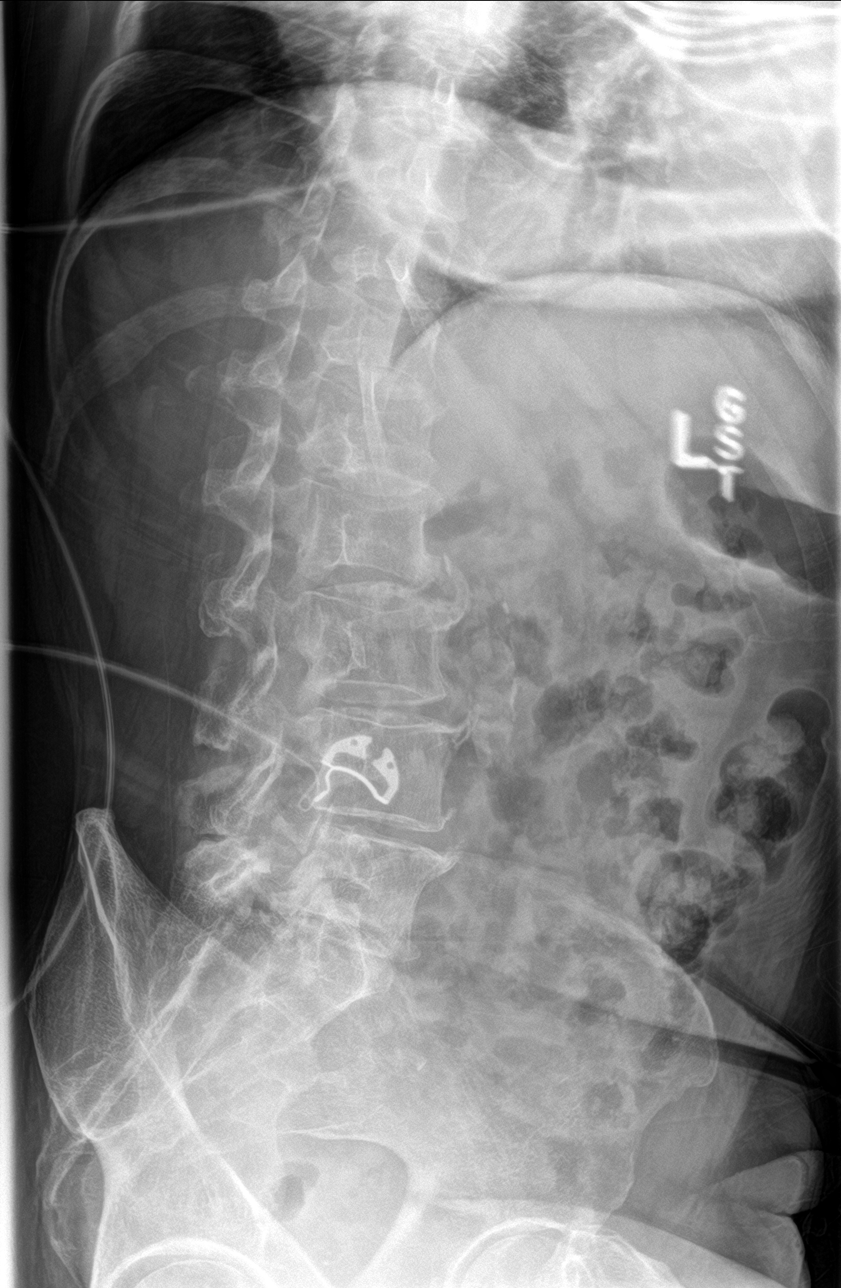

[l-spine lat]
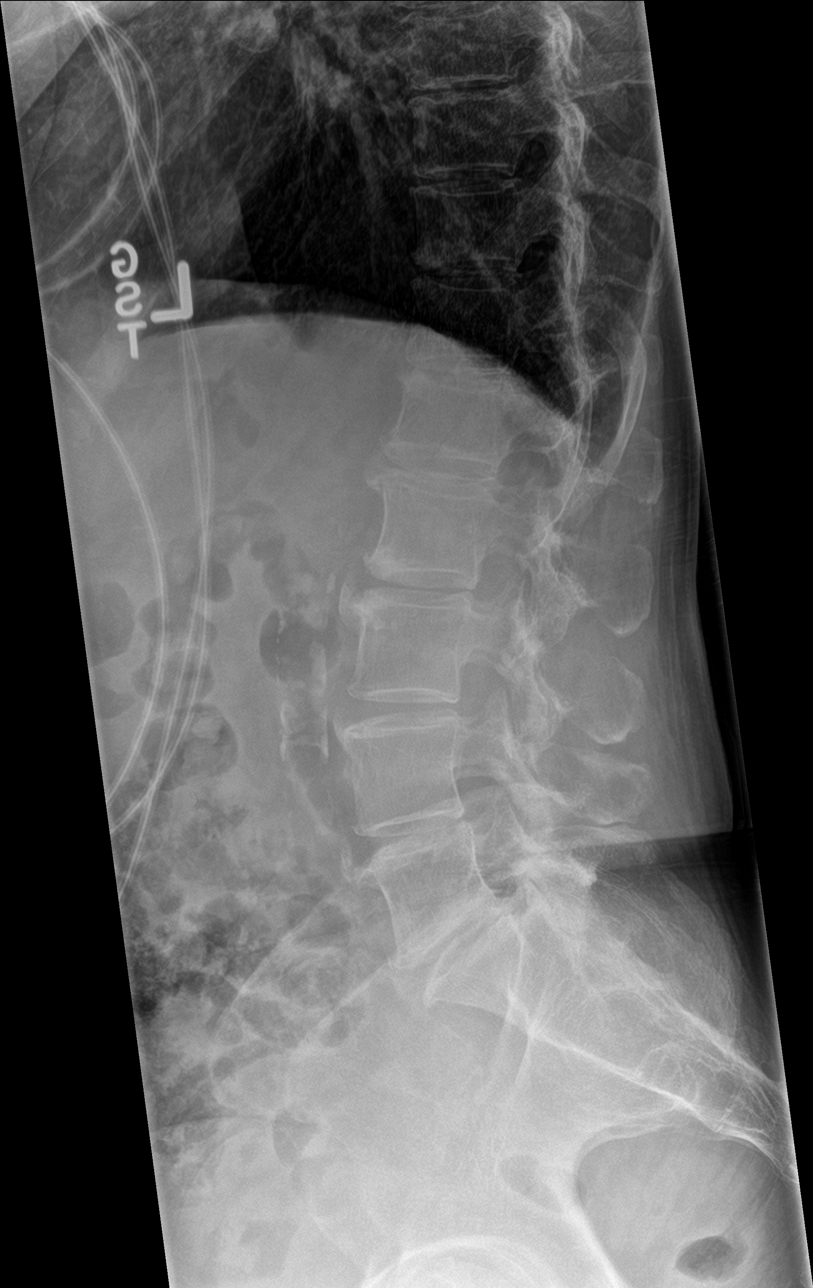

[l-spine spot]
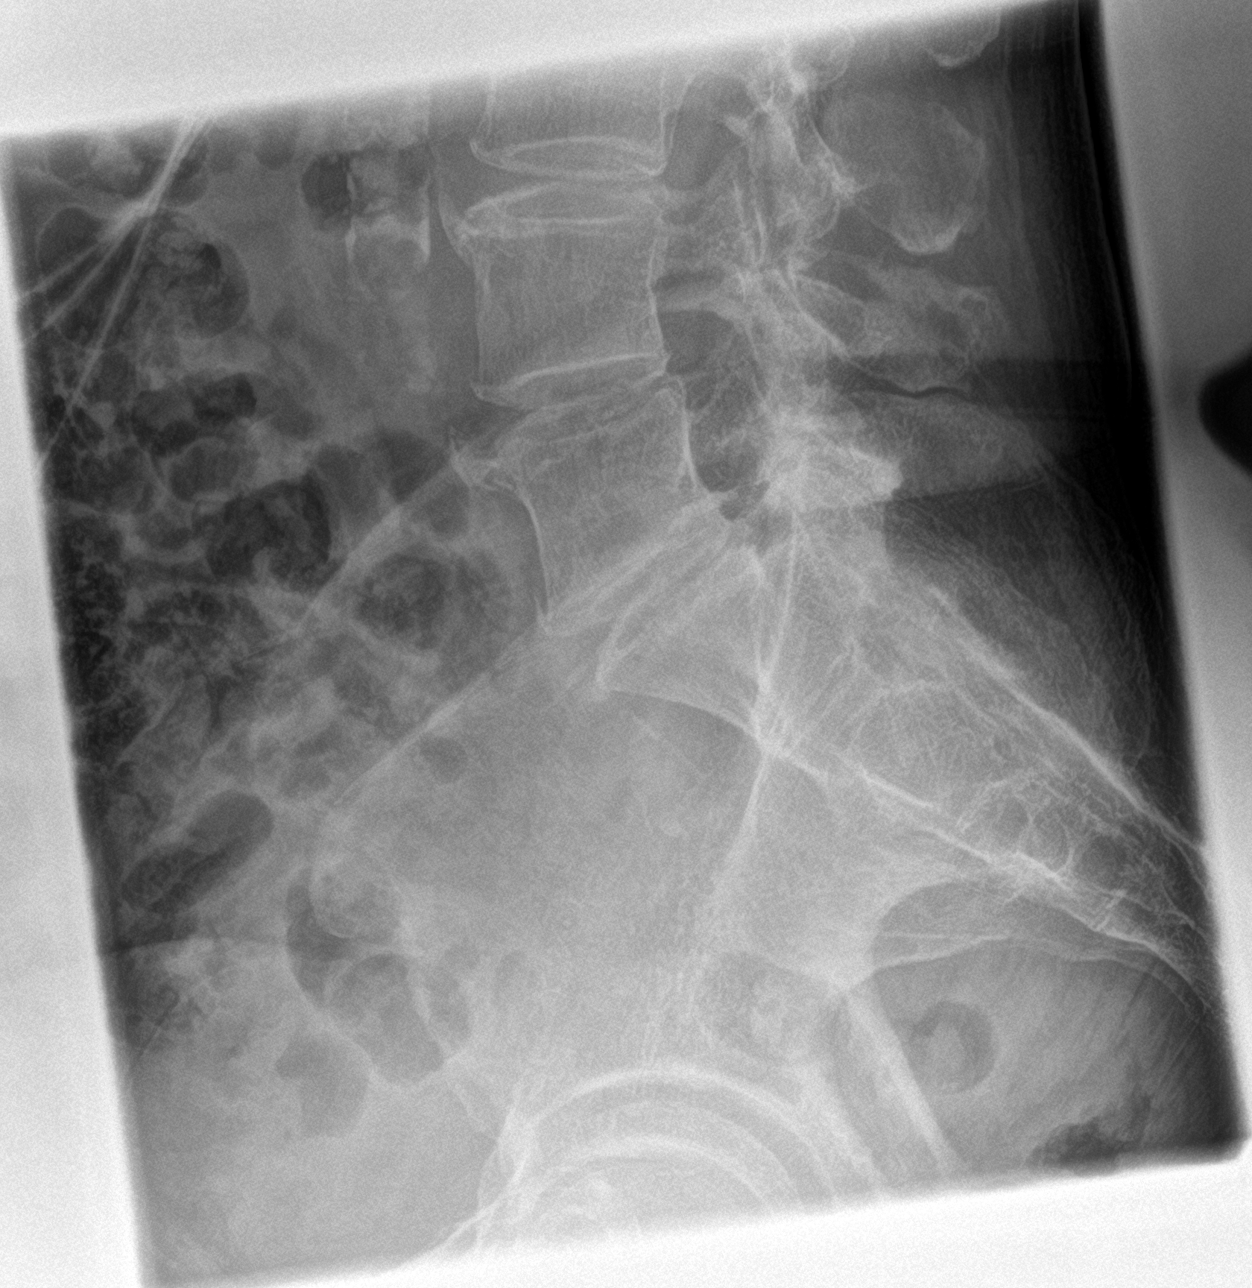

[5 of 5 positions shown; findings below may reference images not displayed]

FINDINGS: 5 non rib-bearing lumbar type vertebra. The SI joints are symmetric.
Lumbar alignment is within normal limits. Vertebral body heights are
normal. Moderate degenerative disc changes at L4-L5 and L5-S1, mild
to moderate changes at L1-L2 and L2-L3. Multilevel anterior
osteophyte. Aortic atherosclerosis.
IMPRESSION: Degenerative changes.  No definite acute osseous abnormality.

## 2018-11-04 DIAGNOSIS — R202 Paresthesia of skin: Secondary | ICD-10-CM | POA: Diagnosis not present

## 2018-11-04 DIAGNOSIS — G43009 Migraine without aura, not intractable, without status migrainosus: Secondary | ICD-10-CM | POA: Diagnosis not present

## 2018-11-04 DIAGNOSIS — Z23 Encounter for immunization: Secondary | ICD-10-CM | POA: Diagnosis not present

## 2018-11-04 DIAGNOSIS — F17219 Nicotine dependence, cigarettes, with unspecified nicotine-induced disorders: Secondary | ICD-10-CM | POA: Diagnosis not present

## 2018-11-04 DIAGNOSIS — R1011 Right upper quadrant pain: Secondary | ICD-10-CM | POA: Diagnosis not present

## 2018-12-01 DIAGNOSIS — R634 Abnormal weight loss: Secondary | ICD-10-CM | POA: Diagnosis not present

## 2018-12-01 DIAGNOSIS — E782 Mixed hyperlipidemia: Secondary | ICD-10-CM | POA: Diagnosis not present

## 2018-12-01 DIAGNOSIS — F5101 Primary insomnia: Secondary | ICD-10-CM | POA: Diagnosis not present

## 2018-12-01 DIAGNOSIS — F17219 Nicotine dependence, cigarettes, with unspecified nicotine-induced disorders: Secondary | ICD-10-CM | POA: Diagnosis not present

## 2018-12-01 DIAGNOSIS — R0602 Shortness of breath: Secondary | ICD-10-CM | POA: Diagnosis not present

## 2018-12-01 DIAGNOSIS — M545 Low back pain: Secondary | ICD-10-CM | POA: Diagnosis not present

## 2018-12-01 DIAGNOSIS — R05 Cough: Secondary | ICD-10-CM | POA: Diagnosis not present

## 2018-12-01 DIAGNOSIS — J449 Chronic obstructive pulmonary disease, unspecified: Secondary | ICD-10-CM | POA: Diagnosis not present

## 2018-12-01 DIAGNOSIS — R10817 Generalized abdominal tenderness: Secondary | ICD-10-CM | POA: Diagnosis not present

## 2018-12-01 DIAGNOSIS — I10 Essential (primary) hypertension: Secondary | ICD-10-CM | POA: Diagnosis not present

## 2018-12-04 DIAGNOSIS — R310 Gross hematuria: Secondary | ICD-10-CM | POA: Diagnosis not present

## 2018-12-04 DIAGNOSIS — N3001 Acute cystitis with hematuria: Secondary | ICD-10-CM | POA: Diagnosis not present

## 2018-12-04 DIAGNOSIS — N10 Acute tubulo-interstitial nephritis: Secondary | ICD-10-CM | POA: Diagnosis not present

## 2018-12-09 ENCOUNTER — Encounter: Payer: Self-pay | Admitting: Family Medicine

## 2018-12-09 DIAGNOSIS — Z961 Presence of intraocular lens: Secondary | ICD-10-CM | POA: Diagnosis not present

## 2018-12-09 DIAGNOSIS — H43813 Vitreous degeneration, bilateral: Secondary | ICD-10-CM | POA: Diagnosis not present

## 2018-12-09 DIAGNOSIS — H35363 Drusen (degenerative) of macula, bilateral: Secondary | ICD-10-CM | POA: Diagnosis not present

## 2018-12-09 DIAGNOSIS — H35033 Hypertensive retinopathy, bilateral: Secondary | ICD-10-CM | POA: Diagnosis not present

## 2018-12-10 DIAGNOSIS — R310 Gross hematuria: Secondary | ICD-10-CM | POA: Diagnosis not present

## 2018-12-10 DIAGNOSIS — Z853 Personal history of malignant neoplasm of breast: Secondary | ICD-10-CM | POA: Diagnosis not present

## 2018-12-10 DIAGNOSIS — R0602 Shortness of breath: Secondary | ICD-10-CM | POA: Diagnosis not present

## 2018-12-10 DIAGNOSIS — I700 Atherosclerosis of aorta: Secondary | ICD-10-CM | POA: Diagnosis not present

## 2018-12-10 DIAGNOSIS — R05 Cough: Secondary | ICD-10-CM | POA: Diagnosis not present

## 2018-12-10 DIAGNOSIS — R10817 Generalized abdominal tenderness: Secondary | ICD-10-CM | POA: Diagnosis not present

## 2018-12-10 DIAGNOSIS — R590 Localized enlarged lymph nodes: Secondary | ICD-10-CM | POA: Diagnosis not present

## 2018-12-10 DIAGNOSIS — M549 Dorsalgia, unspecified: Secondary | ICD-10-CM | POA: Diagnosis not present

## 2018-12-10 DIAGNOSIS — R911 Solitary pulmonary nodule: Secondary | ICD-10-CM | POA: Diagnosis not present

## 2018-12-10 DIAGNOSIS — Z9012 Acquired absence of left breast and nipple: Secondary | ICD-10-CM | POA: Diagnosis not present

## 2018-12-22 DIAGNOSIS — R55 Syncope and collapse: Secondary | ICD-10-CM | POA: Diagnosis not present

## 2018-12-22 DIAGNOSIS — R4789 Other speech disturbances: Secondary | ICD-10-CM | POA: Diagnosis not present

## 2018-12-22 DIAGNOSIS — R42 Dizziness and giddiness: Secondary | ICD-10-CM | POA: Diagnosis not present

## 2018-12-22 DIAGNOSIS — H538 Other visual disturbances: Secondary | ICD-10-CM | POA: Diagnosis not present

## 2018-12-22 DIAGNOSIS — R410 Disorientation, unspecified: Secondary | ICD-10-CM | POA: Diagnosis not present

## 2019-01-01 DIAGNOSIS — R55 Syncope and collapse: Secondary | ICD-10-CM | POA: Diagnosis not present

## 2019-01-01 DIAGNOSIS — R42 Dizziness and giddiness: Secondary | ICD-10-CM | POA: Diagnosis not present

## 2019-01-02 ENCOUNTER — Ambulatory Visit (INDEPENDENT_AMBULATORY_CARE_PROVIDER_SITE_OTHER): Payer: Medicare Other | Admitting: Neurology

## 2019-01-02 ENCOUNTER — Encounter: Payer: Self-pay | Admitting: Neurology

## 2019-01-02 ENCOUNTER — Other Ambulatory Visit: Payer: Self-pay

## 2019-01-02 VITALS — BP 114/70 | HR 84 | Temp 98.2°F | Resp 18 | Ht 61.0 in | Wt 94.0 lb

## 2019-01-02 DIAGNOSIS — F32A Depression, unspecified: Secondary | ICD-10-CM

## 2019-01-02 DIAGNOSIS — F04 Amnestic disorder due to known physiological condition: Secondary | ICD-10-CM

## 2019-01-02 DIAGNOSIS — F419 Anxiety disorder, unspecified: Secondary | ICD-10-CM | POA: Diagnosis not present

## 2019-01-02 DIAGNOSIS — R55 Syncope and collapse: Secondary | ICD-10-CM | POA: Diagnosis not present

## 2019-01-02 DIAGNOSIS — F329 Major depressive disorder, single episode, unspecified: Secondary | ICD-10-CM | POA: Diagnosis not present

## 2019-01-02 DIAGNOSIS — I6521 Occlusion and stenosis of right carotid artery: Secondary | ICD-10-CM

## 2019-01-02 NOTE — Patient Instructions (Addendum)
Routine EEG.  We will call you with the results and let you know the next step.   If you choose to start physical therapy for your back, please let me or Dr. Tobie Poet know  Please consider seeing a counselor for anxiety/depression

## 2019-01-02 NOTE — Progress Notes (Signed)
[facility] Clinic Note - Initial Visit   Date: 01/02/19  Anne [name] MRN: [id number] DOB: Dec 13, 1945   Dear Dr. Tobie Poet:  Thank you for your kind referral of Anne [name] for consultation of amnestic spell and stuttering. Although her history is well known to you, please allow [name] to reiterate it for the purpose of our medical record. The patient was accompanied to the clinic by self.    History of Present Illness: Anne [name] is a 73 y.o. right-handed female with tobacco use, history of left breast cancer s/p double mastectomy (2017), severe depression/anxiety, COPD with asthma, and migraine referred for urgent evaluation of amnestic spell and stuttering.  She lives alone and her son lives next door.    She works as a Sports coach at an [employer] and her building was being sanitized on 12/18/2018.  Because of the chemicals, she had an asthma attack and was very stressed by this event. She was extremely bothered by her employers reaction which only exacerbated her anxiety/stress.  She returned home from work on 10/15 and was eating dinner, but does not recall any of the events of the evening.  The following morning, she found her plate upside down on the floor, sofa was wet with coke spilled on it.  She went to work and felt tired, but was able to complete her work.  She continued to feel weak and tired over the next few days so went to see her PCP who noted severe stuttering.  She had EKG, labs, and CT head.  She had echocardiogram yesterday and is scheduled to have MRI brain today.  She is scheduled to have carotid ultrasound next week.    She endorses severe anxiety/depression and takes several medications including amitriptyline 75mg , mirtazapine 45mg , venlafaxine 150mg  daily.  She also takes temazepam 15mg  for insomnia and tizanidine for muscle spasms.   She complains of numbness and burning in the feet, which she attributes to working and being on  her feet all day.    She was previously evaluated by Dr. Leta Baptist at [facility] in 2016 for stuttering and speech changes.  MRI brain and CT head was normal.  US carotids showed R ICA stenosis ~ 50-70%.  She has long history of migraines which is responsive to topiramate 50mg  and imitrex.   Out-side paper records, electronic medical record, and images have been reviewed where available and summarized as:  11/19/14 MRI brain  - No acute intracranial findings. No evidence for recent infarction. Unremarkable intracranial MRA.  11/15/14 Carotid u/s  - right internal carotid artery: Heterogeneous and partially calcified plaque with 50-70% stenosis - left internal carotid artery: No significant stenosis  11/17/14 TTE  - Normal left ventricular size function - No cardiac source of emboli  104/16 CTA neck  - bilateral carotid bifurcation plaque and calcifications with no measurable hemodynamically significant stenosis.  Past Medical History:  Diagnosis Date  . Asthma    daily inhalers  . Breast cancer ([place]) 05/2015   left  . COPD (chronic obstructive pulmonary disease) ([place])    denies SOB with ADLs; no O2  . Dental crowns present   . Depression   . History of concussion 01/01/2014  . History of kidney stones   . History of TIA (transient ischemic attack) 10/2014  . Hyperlipidemia   . Insomnia   . Migraine   . Nasal congestion 05/26/2015   will finish z-pak 05/27/2015  . Nonproductive cough 05/26/2015  . PONV (postoperative nausea  and vomiting)    "long time ago"  none recently  . RLS (restless legs syndrome)   . Tobacco abuse   . Vitamin D deficiency   . Wears dentures    lower  . Wears partial dentures    upper    Past Surgical History:  Procedure Laterality Date  . ABDOMINAL HYSTERECTOMY  2000   complete  . APPENDECTOMY  2000  . BREAST BIOPSY Left   . BREAST RECONSTRUCTION WITH PLACEMENT OF TISSUE EXPANDER AND FLEX HD (ACELLULAR HYDRATED DERMIS) Bilateral 06/02/2015    Procedure: BILATERAL BREAST RECONSTRUCTION WITH PLACEMENT OF TISSUE EXPANDER AND  ACELLULAR HYDRATED DERMIS;  Surgeon: Irene Limbo, MD;  Location: [facility];  Service: Plastics;  Laterality: Bilateral;  . CARPAL TUNNEL RELEASE Bilateral   . CATARACT EXTRACTION Bilateral 09/2013  . KNEE ARTHROSCOPY Left   . LOOP RECORDER INSERTION N/A 05/11/2016   Procedure: Loop Recorder Insertion;  Surgeon: Will Meredith Leeds, MD;  Location: [facility];  Service: Cardiovascular;  Laterality: N/A;  . MASTECTOMY W/ SENTINEL NODE BIOPSY Bilateral 06/02/2015   Procedure: BILATERAL MASTECTOMY WITH LEFT SENTINEL LYMPH NODE BIOPSY;  Surgeon: Stark Klein, MD;  Location: [facility];  Service: General;  Laterality: Bilateral;  . NASAL SEPTUM SURGERY     x 3  . PLANTAR FASCIA SURGERY Left   . REMOVAL OF BILATERAL TISSUE EXPANDERS WITH PLACEMENT OF BILATERAL BREAST IMPLANTS Bilateral 06/17/2015   Procedure: DEBRIDEMENT OF BILATERAL MASTECTOMY FLAP WITH BILATERAL TISSUE EXPANDER EXCHANGE ;  Surgeon: Irene Limbo, MD;  Location: [facility];  Service: Plastics;  Laterality: Bilateral;  . REMOVAL OF TISSUE EXPANDER Bilateral 07/12/2015   Procedure: REMOVAL OF BILATERAL TISSUE EXPANDERS;  Surgeon: Irene Limbo, MD;  Location: [facility];  Service: Plastics;  Laterality: Bilateral;     Medications:  Outpatient Encounter Medications as of 01/02/2019  Medication Sig Note  . amitriptyline (ELAVIL) 75 MG tablet Take 1 tablet by mouth at bedtime.   Marland Kitchen atorvastatin (LIPITOR) 20 MG tablet Take 20 mg by mouth daily.   Marland Kitchen BREO ELLIPTA 100-25 MCG/INH AEPB Inhale 1 puff into the lungs daily as needed (for respiratory issues).    . Cholecalciferol (VITAMIN D3) 50000 UNITS CAPS Take 50,000 Units by mouth every 30 (thirty) days.  05/09/2016: On average once a month (patient does not always remember every week)  . fenofibrate 160 MG tablet Take 160 mg by mouth daily.   . furosemide  (LASIX) 20 MG tablet Take 20 mg by mouth daily as needed for edema (for leg swelling).    Marland Kitchen HYDROcodone-acetaminophen (NORCO/VICODIN) 5-325 MG tablet Take 1 tablet by mouth every 4 (four) hours as needed (for pain.).    Marland Kitchen mirtazapine (REMERON) 30 MG tablet Take 1 tablet by mouth at bedtime.   . mirtazapine (REMERON) 45 MG tablet Take 45 mg by mouth at bedtime.   . promethazine (PHENERGAN) 25 MG tablet Take 1 tablet by mouth as needed for nausea/vomiting.   . SUMAtriptan (IMITREX) 100 MG tablet Take 100 mg by mouth daily as needed for migraine (100 mg and then may repeat in 2 hours if headache persists).    . tamoxifen (NOLVADEX) 20 MG tablet Take 20 mg by mouth at bedtime.    . temazepam (RESTORIL) 15 MG capsule Take 15 mg by mouth at bedtime.    Marland Kitchen tiZANidine (ZANAFLEX) 4 MG tablet Take 4 mg by mouth every 6 (six) hours as needed for muscle spasms.   Marland Kitchen topiramate (TOPAMAX)  50 MG tablet Take 1 tablet by mouth as needed.   . traMADol (ULTRAM) 50 MG tablet Take 1 tablet by mouth as needed.   . TRELEGY ELLIPTA 100-62.5-25 MCG/INH AEPB Take 2 puffs by mouth daily.   Marland Kitchen venlafaxine XR (EFFEXOR-XR) 150 MG 24 hr capsule Take 150 mg by mouth at bedtime.   . VENTOLIN HFA 108 (90 BASE) MCG/ACT inhaler Inhale 1 puff into the lungs every 6 (six) hours as needed for wheezing or shortness of breath.    . Vitamin D, Ergocalciferol, (DRISDOL) 1.25 MG (50000 UT) CAPS capsule Take 1 capsule by mouth every 7 (seven) days.   . [DISCONTINUED] nicotine (NICODERM CQ - DOSED IN MG/24 HOURS) 21 mg/24hr patch Place 1 patch (21 mg total) onto the skin daily. (Patient not taking: Reported on 01/02/2019)    No facility-administered encounter medications on file as of 01/02/2019.     Allergies:  Allergies  Allergen Reactions  . Rocephin [Ceftriaxone Sodium In Dextrose] Shortness Of Breath and Rash  . Clindamycin/Lincomycin Nausea And Vomiting  . Gabapentin Other (See Comments)    Personality Changes  . Lyrica  [Pregabalin] Other (See Comments)    PERSONALITY CHANGE    Family History: Family History  Problem Relation Age of Onset  . Cervical cancer Mother 40  . Colon cancer Maternal Grandfather        mets to stomach; dx. 67-70  . Heart disease Father   . Prostate cancer Father 40  . Cervical cancer Maternal Grandmother        dx. 64s; treated with radium implant  . Lung cancer Sister 57       maternal half-sister dx. lung cancer, stage III  . Breast cancer Maternal Aunt   . Cervical cancer Sister 72       paternal half-sister; s/p TAH  . Spina bifida Grandchild   . Brain cancer Grandchild        grandson dx. at 20 mos, [facility]  . Breast cancer Other 49       maternal half-sister's daughter  . Cancer Maternal Aunt        d. 23; unspecified type - "began at back area and moved to vital organs"  . Cancer Maternal Uncle         late 34s; unspecified type; "started at back and moved to vital organs"    Social History: Social History   Tobacco Use  . Smoking status: Light Tobacco Smoker    Packs/day: 0.25    Types: Cigarettes    Last attempt to quit: 05/04/2015    Years since quitting: 3.6  . Smokeless tobacco: Never Used  Substance Use Topics  . Alcohol use: Yes    Alcohol/week: 0.0 standard drinks    Comment: rarely  . Drug use: No   Social History   Social History Narrative   Lives alone, widow   Caffeine use- tea, 1 cup daily    Review of Systems:  CONSTITUTIONAL: No fevers, chills, night sweats, or weight loss.   EYES: No visual changes or eye pain ENT: No hearing changes.  No history of nose bleeds.   RESPIRATORY: No cough, wheezing and shortness of breath.   CARDIOVASCULAR: Negative for chest pain, and palpitations.   GI: Negative for abdominal discomfort, blood in stools or black stools.  No recent change in bowel habits.   GU:  No history of incontinence.   MUSCLOSKELETAL: No history of joint pain or swelling.  No myalgias.   SKIN:  Negative for  lesions, rash, and itching.   HEMATOLOGY/ONCOLOGY: Negative for prolonged bleeding, bruising easily, and swollen nodes.  No history of cancer.   ENDOCRINE: Negative for cold or heat intolerance, polydipsia or goiter.   PSYCH:  +depression ++anxiety symptoms.   NEURO: As Above.   Vital Signs:  BP 114/70 (BP Location: Right Arm, Patient Position: Sitting)   Pulse 84   Temp 98.2 F (36.8 C)   Resp 18   Ht 5\' 1"  (1.549 m)   Wt 94 lb (42.6 kg)   SpO2 94%   BMI 17.76 kg/m    General Medical Exam:   General:  Anxious-appearing, thin.   Eyes/ENT: see cranial nerve examination.   Neck:   No carotid bruits. Respiratory:  Clear to auscultation, good air entry bilaterally.   Cardiac:  Regular rate and rhythm, no murmur.   Extremities:  No deformities, edema, or skin discoloration.  Skin:  No rashes or lesions.  Neurological Exam: MENTAL STATUS including orientation to time, place, person, recent and remote memory, attention span and concentration, language, and fund of knowledge is normal.  Speech is not dysarthric, mild intermittent stutter which is more pronounced when she is nervous/anxious  CRANIAL NERVES: II:  No visual field defects.  Unremarkable fundi.   III-IV-VI: Pupils equal round and reactive to light.  Normal conjugate, extra-ocular eye movements in all directions of gaze.  No nystagmus.  No ptosis.   V:  Normal facial sensation.    VII:  Normal facial symmetry and movements.   VIII:  Normal hearing and vestibular function.   IX-X:  Normal palatal movement.   XI:  Normal shoulder shrug and head rotation.   XII:  Normal tongue strength and range of motion, no deviation or fasciculation.  MOTOR:  Generalized loss of muscle bulk throughout.  No atrophy, fasciculations or abnormal movements.  No pronator drift.   Upper Extremity:  Right  Left  Deltoid  5/5   5/5   Biceps  5/5   5/5   Triceps  5/5   5/5   Infraspinatus 5/5  5/5  Medial pectoralis 5/5  5/5  Wrist extensors   5/5   5/5   Wrist flexors  5/5   5/5   Finger extensors  5/5   5/5   Finger flexors  5/5   5/5   Dorsal interossei  5/5   5/5   Abductor pollicis  5/5   5/5   Tone (Ashworth scale)  0  0   Lower Extremity:  Right  Left  Hip flexors  5/5   5/5   Hip extensors  5/5   5/5   Adductor 5/5  5/5  Abductor 5/5  5/5  Knee flexors  5/5   5/5   Knee extensors  5/5   5/5   Dorsiflexors  5/5   5/5   Plantarflexors  5/5   5/5   Toe extensors  5/5   5/5   Toe flexors  5/5   5/5   Tone (Ashworth scale)  0  0   MSRs:  Right        Left                  brachioradialis 2+  2+  biceps 2+  2+  triceps 2+  2+  patellar 2+  2+  ankle jerk 1+  1+  Hoffman no  no  plantar response down  down   SENSORY:  Normal and symmetric perception of light  touch, pinprick, vibration, and proprioception.    COORDINATION/GAIT: Normal finger-to- nose-finger and heel-to-shin.  Intact rapid alternating movements bilaterally.  Able to rise from a chair without using arms.  Gait narrow based and stable.   IMPRESSION: 1. Amnestic spell in the setting of high anxiety following by stuttering may be stress reaction.  However, with her home in disarray the following morning after the event, seizure needs to be evaluated for with EEG.  Doubt stroke as she has no ongoing deficits and her stuttering was also present in 2016, per Dr. Gladstone Lighter notes.  Alternatively, medication reaction cannot be excluded as she is also on a number of medications which can cause cognitive side effects, including sedatives.  I will follow-up with her MRI head, echo, US carotids, and EEG and decide if further testing is indicated.   2.  Stuttering is very mild and intermittent today.  Very rarely is stuttering primary stroke manifestation, so suspect this is anxiety driven.  3.  Right ICA stenosis 50-70% in 2016.  Repeat US carotids scheduled for next week.  Strongly urged her to stop smoking to minimize vascular risk factors.   Further  recommendations pending results.   Thank you for allowing me to participate in patient's care.  If I can answer any additional questions, I would be pleased to do so.    Sincerely,     K. Posey Pronto, DO

## 2019-01-08 DIAGNOSIS — R42 Dizziness and giddiness: Secondary | ICD-10-CM | POA: Diagnosis not present

## 2019-01-08 DIAGNOSIS — R413 Other amnesia: Secondary | ICD-10-CM | POA: Diagnosis not present

## 2019-01-08 DIAGNOSIS — H538 Other visual disturbances: Secondary | ICD-10-CM | POA: Diagnosis not present

## 2019-01-08 DIAGNOSIS — R55 Syncope and collapse: Secondary | ICD-10-CM | POA: Diagnosis not present

## 2019-01-09 DIAGNOSIS — I10 Essential (primary) hypertension: Secondary | ICD-10-CM | POA: Diagnosis not present

## 2019-01-09 DIAGNOSIS — I6523 Occlusion and stenosis of bilateral carotid arteries: Secondary | ICD-10-CM | POA: Diagnosis not present

## 2019-01-16 ENCOUNTER — Other Ambulatory Visit: Payer: Self-pay

## 2019-01-16 ENCOUNTER — Ambulatory Visit (INDEPENDENT_AMBULATORY_CARE_PROVIDER_SITE_OTHER): Payer: Medicare Other | Admitting: Neurology

## 2019-01-16 DIAGNOSIS — R55 Syncope and collapse: Secondary | ICD-10-CM

## 2019-01-20 ENCOUNTER — Telehealth: Payer: Self-pay | Admitting: Neurology

## 2019-01-20 NOTE — Telephone Encounter (Signed)
Laureen Ochs, do you have these results?

## 2019-01-20 NOTE — Telephone Encounter (Signed)
Pt called results are not back at present, patient understood.

## 2019-01-20 NOTE — Telephone Encounter (Signed)
Patient is calling in wanting her EEG results. Thanks!

## 2019-01-20 NOTE — Telephone Encounter (Signed)
Have you seen theese results?

## 2019-01-20 NOTE — Telephone Encounter (Signed)
Please inform patient the EEG results are not available yet.  Once it is resulted, we will contact patient and Dr. Tobie Poet.  Thanks.

## 2019-01-20 NOTE — Telephone Encounter (Signed)
Patient is wanting to get the results of the EEG she had done on Friday and make sure we sent them to Dr Cox as well    [phone] work phone number and she said to ask for [name] that is what they know her as.

## 2019-01-21 ENCOUNTER — Telehealth: Payer: Self-pay

## 2019-01-21 NOTE — Procedures (Signed)
ELECTROENCEPHALOGRAM REPORT  Date of Study: 01/16/2019  Patient's Name: Anne [name] MRN: [id number] Date of Birth: 07-31-1945  Referring Provider: Dr. Narda Amber  Clinical History: This is a 73 year old woman with amnestic spell, stuttering.   Medications: TOPAMAX 50 MG tablet ELAVIL 75 MG tablet LIPITOR 20 MG tablet BREO ELLIPTA 100-25 MCG/INH AEPB VITAMIN D3 50000 UNITS CAPS fenofibrate 160 MG tablet LASIX 20 MG tablet NORCO/VICODIN 5-325 MG tablet REMERON 30 MG tablet PHENERGAN 25 MG tablet IMITREX 100 MG tablet NOLVADEX 20 MG tablet RESTORIL 15 MG capsule ZANAFLEX 4 MG tablet ULTRAM 50 MG tablet TRELEGY ELLIPTA 100-62.5-25 MCG/INH AEPB EFFEXOR-XR 150 MG 24 hr capsule VENTOLIN HFA 108 (90 BASE) MCG/ACT inhaler DRISDOL 1.25 MG (50000 UT) CAPS capsule   Technical Summary: A multichannel digital EEG recording measured by the international 10-20 system with electrodes applied with paste and impedances below 5000 ohms performed in our laboratory with EKG monitoring in an awake and drowsy patient.  Hyperventilation was not performed. Photic stimulation was performed.  The digital EEG was referentially recorded, reformatted, and digitally filtered in a variety of bipolar and referential montages for optimal display.    Description: The patient is awake and drowsy during the recording.  During maximal wakefulness, there is a symmetric, medium voltage 10 Hz posterior dominant rhythm that attenuates with eye opening.  The record is symmetric.  During drowsiness, there is an increase in theta slowing of the background. Sleep was not captured. Photic stimulation did not elicit any abnormalities.  There were no epileptiform discharges or electrographic seizures seen.    EKG lead was unremarkable.  Impression: This awake and drowsy EEG is normal.    Clinical Correlation: A normal EEG does not exclude a clinical diagnosis of epilepsy. Clinical correlation is advised.   Ellouise Newer, M.D.

## 2019-01-21 NOTE — Telephone Encounter (Signed)
I contacted patient and EEG Results were given, pt to sign release and obtain Mri, cartoid study and echo results

## 2019-01-28 DIAGNOSIS — R55 Syncope and collapse: Secondary | ICD-10-CM | POA: Diagnosis not present

## 2019-01-28 DIAGNOSIS — L03011 Cellulitis of right finger: Secondary | ICD-10-CM | POA: Diagnosis not present

## 2019-01-28 DIAGNOSIS — M545 Low back pain: Secondary | ICD-10-CM | POA: Diagnosis not present

## 2019-01-28 DIAGNOSIS — R202 Paresthesia of skin: Secondary | ICD-10-CM | POA: Diagnosis not present

## 2019-01-28 DIAGNOSIS — R600 Localized edema: Secondary | ICD-10-CM | POA: Diagnosis not present

## 2019-01-28 DIAGNOSIS — I6523 Occlusion and stenosis of bilateral carotid arteries: Secondary | ICD-10-CM | POA: Diagnosis not present

## 2019-01-31 DIAGNOSIS — M545 Low back pain: Secondary | ICD-10-CM | POA: Diagnosis not present

## 2019-02-06 DIAGNOSIS — J441 Chronic obstructive pulmonary disease with (acute) exacerbation: Secondary | ICD-10-CM | POA: Diagnosis not present

## 2019-02-06 DIAGNOSIS — R05 Cough: Secondary | ICD-10-CM | POA: Diagnosis not present

## 2019-03-03 NOTE — Telephone Encounter (Signed)
Records received and will be scanned.  MRI brain without contrast 01/08/2019: No acute or new findings.  No evidence of recent infarction, intracranial hemorrhage, or mass  Ultrasound carotid 01/08/2019: 1. Moderate 50-69% stenosis proximal left internal carotid artery secondary to heterogeneous atherosclerotic plaque. 2. Mild 1-49% stenosis proximal right internal carotid artery secondary to heterogeneous atherosclerotic plaque.   3. The vertebral arteries are patent with normal antegrade flow.  Echocardiogram 01/01/2019: Normal left ventricular contractility, EF 60-65%.  Right ventricle, left atrium, and right atrium is normal.  Aortic arch is normal.

## 2019-03-11 DIAGNOSIS — M545 Low back pain: Secondary | ICD-10-CM | POA: Diagnosis not present

## 2019-03-19 DIAGNOSIS — M545 Low back pain: Secondary | ICD-10-CM | POA: Diagnosis not present

## 2019-03-20 DIAGNOSIS — H35033 Hypertensive retinopathy, bilateral: Secondary | ICD-10-CM | POA: Diagnosis not present

## 2019-03-20 DIAGNOSIS — H5319 Other subjective visual disturbances: Secondary | ICD-10-CM | POA: Diagnosis not present

## 2019-03-20 DIAGNOSIS — H02401 Unspecified ptosis of right eyelid: Secondary | ICD-10-CM | POA: Diagnosis not present

## 2019-03-20 DIAGNOSIS — H16223 Keratoconjunctivitis sicca, not specified as Sjogren's, bilateral: Secondary | ICD-10-CM | POA: Diagnosis not present

## 2019-03-20 DIAGNOSIS — H04123 Dry eye syndrome of bilateral lacrimal glands: Secondary | ICD-10-CM | POA: Diagnosis not present

## 2019-03-24 DIAGNOSIS — M545 Low back pain: Secondary | ICD-10-CM | POA: Diagnosis not present

## 2019-03-26 DIAGNOSIS — M545 Low back pain: Secondary | ICD-10-CM | POA: Diagnosis not present

## 2019-03-31 DIAGNOSIS — Z20828 Contact with and (suspected) exposure to other viral communicable diseases: Secondary | ICD-10-CM | POA: Diagnosis not present

## 2019-03-31 DIAGNOSIS — R05 Cough: Secondary | ICD-10-CM | POA: Diagnosis not present

## 2019-04-06 ENCOUNTER — Other Ambulatory Visit: Payer: Self-pay

## 2019-04-07 NOTE — Telephone Encounter (Signed)
Attempted to contact patient unable to leave voicemail. Need to confirm patients dose and frequency for both Gabapentin and Tramadol.

## 2019-04-09 MED ORDER — GABAPENTIN 300 MG PO CAPS: 300.0000 mg | ORAL_CAPSULE | Freq: Three times a day (TID) | ORAL | 0 refills | Status: DC

## 2019-04-09 MED ORDER — TRAMADOL HCL 50 MG PO TABS: 50.0000 mg | ORAL_TABLET | ORAL | 0 refills | Status: DC | PRN

## 2019-04-21 DIAGNOSIS — M545 Low back pain: Secondary | ICD-10-CM | POA: Diagnosis not present

## 2019-04-23 ENCOUNTER — Ambulatory Visit (INDEPENDENT_AMBULATORY_CARE_PROVIDER_SITE_OTHER): Payer: Medicare Other | Admitting: Family Medicine

## 2019-04-23 DIAGNOSIS — M5416 Radiculopathy, lumbar region: Secondary | ICD-10-CM

## 2019-04-23 DIAGNOSIS — F488 Other specified nonpsychotic mental disorders: Secondary | ICD-10-CM

## 2019-04-23 HISTORY — DX: Other specified nonpsychotic mental disorders: F48.8

## 2019-04-23 HISTORY — DX: Radiculopathy, lumbar region: M54.16

## 2019-04-23 NOTE — Progress Notes (Signed)
Subjective:  Patient ID: Anne [name], female    DOB: 02-Sep-1945  Age: 74 y.o. MRN: [id number]   Virtual Visit via Telephone Note   This visit type was conducted due to national recommendations for restrictions regarding the COVID-19 Pandemic (e.g. social distancing) in an effort to limit this patient's exposure and mitigate transmission in our community.  Due to @HIS @ co-morbid illnesses, this patient is at least at moderate risk for complications without adequate follow up.  This format is felt to be most appropriate for this patient at this time.  The patient did not have access to video technology/had technical difficulties with video requiring transitioning to audio format only (telephone).  All issues noted in this document were discussed and addressed.  No physical exam could be performed with this format.  Patient verbally consented to a telehealth visit.   Date: April 23, 2019   Patient Location: Home Provider Location: Office  Evaluation Performed:  Follow-Up Visit  CC: Back pain.  HPI Patient is a 74 year old white female who presents for follow-up of low back pain.  Her x-ray in November 2020 showed multiple level degenerative disc disease.  The patient is currently on gabapentin 300 mg 1 3 times a day methocarbamol 500 mg 4 times a day tramadol once daily and hydrocodone once daily.  She has been going to physical therapy twice a week without improvement barring a 2-week quarantine for COVID-19.  I have not given her a course of prednisone.    Patient has also had issues with syncopal episodes.  They are very unusual.  She generally is eating in her living room in the evenings and she awakens to find that her food is all over the floor RN she has food on her face.  One episode she actually woke up in bed and found this situation in her living room.  She has had no injuries.  She has had a total of 4 " syncopal" episodes.  She is on many medications that are sedating and in  addition she says she only gets 2 to 4 hours of sleep.  I have performed a very thorough work-up for possible stroke which includes an MRI of her brain, carotid Dopplers, and an echocardiogram.  In addition she saw neurology.  Neurology did not feel she was having seizures or there was any neurological source.  They were concerned it was due to psychological stress.  Social Hx   Social History   Socioeconomic History  . Marital status: Widowed    Spouse name: Not on file  . Number of children: 1  . Years of education: 47  . Highest education level: Not on file  Occupational History    Comment: [employer], retired  Tobacco Use  . Smoking status: Light Tobacco Smoker    Packs/day: 0.25    Types: Cigarettes    Last attempt to quit: 05/04/2015    Years since quitting: 3.9  . Smokeless tobacco: Never Used  Substance and Sexual Activity  . Alcohol use: Yes    Alcohol/week: 0.0 standard drinks    Comment: rarely  . Drug use: No  . Sexual activity: Not on file  Other Topics Concern  . Not on file  Social History Narrative   Lives alone, widow   Caffeine use- tea, 1 cup daily   Social Determinants of Health   Financial Resource Strain:   . Difficulty of Paying Living Expenses: Not on file  Food Insecurity:   . Worried  About Running Out of Food in the Last Year: Not on file  . Ran Out of Food in the Last Year: Not on file  Transportation Needs:   . Lack of Transportation (Medical): Not on file  . Lack of Transportation (Non-Medical): Not on file  Physical Activity:   . Days of Exercise per Week: Not on file  . Minutes of Exercise per Session: Not on file  Stress:   . Feeling of Stress : Not on file  Social Connections:   . Frequency of Communication with Friends and Family: Not on file  . Frequency of Social Gatherings with Friends and Family: Not on file  . Attends Religious Services: Not on file  . Active Member of Clubs or Organizations: Not on file  . Attends  Archivist Meetings: Not on file  . Marital Status: Not on file   Past Medical History:  Diagnosis Date  . Asthma    daily inhalers  . Breast cancer ([place]) 05/2015   left  . COPD (chronic obstructive pulmonary disease) ([place])    denies SOB with ADLs; no O2  . Dental crowns present   . Depression   . History of concussion 01/01/2014  . History of kidney stones   . History of TIA (transient ischemic attack) 10/2014  . Hyperlipidemia   . Insomnia   . Migraine   . Nasal congestion 05/26/2015   will finish z-pak 05/27/2015  . Nonproductive cough 05/26/2015  . PONV (postoperative nausea and vomiting)    "long time ago"  none recently  . RLS (restless legs syndrome)   . Tobacco abuse   . Vitamin D deficiency   . Wears dentures    lower  . Wears partial dentures    upper   Family History  Problem Relation Age of Onset  . Cervical cancer Mother 54  . Colon cancer Maternal Grandfather        mets to stomach; dx. 67-70  . Heart disease Father   . Prostate cancer Father 47  . Cervical cancer Maternal Grandmother        dx. 73s; treated with radium implant  . Lung cancer Sister 75       maternal half-sister dx. lung cancer, stage III  . Breast cancer Maternal Aunt   . Cervical cancer Sister 30       paternal half-sister; s/p TAH  . Spina bifida Grandchild   . Brain cancer Grandchild        grandson dx. at 20 mos, treated at [facility]  . Breast cancer Other 3       maternal half-sister's daughter  . Cancer Maternal Aunt        d. 34; unspecified type - "began at back area and moved to vital organs"  . Cancer Maternal Uncle         late 61s; unspecified type; "started at back and moved to vital organs"    Review of Systems  Constitutional: Negative for chills, fatigue and fever.  HENT: Negative for congestion, ear pain and sore throat.   Respiratory: Negative for cough and shortness of breath.   Cardiovascular: Negative for chest pain.  Gastrointestinal: Positive for  constipation. Negative for diarrhea, nausea and vomiting.       Right upper quadrant abdominal pain is related to her surgery she had last summer.  It was extensive and she had significant complications.  Likely she has scar tissue. She has not tried anything for her constipation.  Musculoskeletal: Negative  for arthralgias and myalgias.  Psychiatric/Behavioral: Negative for dysphoric mood. The patient is not nervous/anxious.      Objective:  There were no vitals taken for this visit.  BP/Weight 01/02/2019 10/01/2017 99991111  Systolic BP 99991111 [id number]  Diastolic BP 70 72 60  Wt. (Lbs) 94 105 109.6  BMI 17.76 19.84 20.71   Unable to give me VS for today.  Physical Exam Neurological:     Mental Status: She is alert.     Lab Results  Component Value Date   WBC 6.7 07/03/2016   HGB 11.9 (L) 07/03/2016   HCT 37.3 07/03/2016   PLT 164 07/03/2016   GLUCOSE 119 (H) 07/03/2016   ALT 18 07/03/2016   AST 23 07/03/2016   NA 144 07/03/2016   K 3.9 07/03/2016   CL 120 (H) 07/03/2016   CREATININE 0.76 07/03/2016   BUN 11 07/03/2016   CO2 21 (L) 07/03/2016      Assessment & Plan:   Problem List Items Addressed This Visit      Nervous and Auditory   Right lumbar radiculopathy - Primary    Back pain is worsening.  Ordered prednisone 50 mg once daily for 5 days.  Continue physical therapy.  We will order a lumbar MRI.      Relevant Medications   predniSONE (DELTASONE) 50 MG tablet   methocarbamol (ROBAXIN) 500 MG tablet   Other Relevant Orders   MR Lumbar Spine Wo Contrast     Other   Psychogenic syncope    Cause is unclear.  Had a very thorough work-up.  I am concerned the patient is in fact falling asleep due to her severe fatigue and lack of sleep every night.  I strongly recommend the patient work on getting 8 hours of sleep per night.        Right lumbar radiculopathy Back pain is worsening.  Ordered prednisone 50 mg once daily for 5 days.  Continue physical  therapy.  We will order a lumbar MRI.  Psychogenic syncope Cause is unclear.  Had a very thorough work-up.  I am concerned the patient is in fact falling asleep due to her severe fatigue and lack of sleep every night.  I strongly recommend the patient work on getting 8 hours of sleep per night.  AN INDIVIDUALIZED CARE PLAN: was established or reinforced today.   The patient's disease status was assessed using clinical findings on exam, labs, and/or other diagnostic testing, such as xrays, to determine his/her success in meeting treatment goals based on disease specific evidence-based guidelines and found to be well controlled.   SELF MANAGEMENT: The patient and I together assessed ways to personally work towards obtaining the recommended goals  Support needs The patient and/or family needs were assessed and services were offered and not necessary at this time.  Patient has no family support.   Follow-up: Return in about 4 weeks (around 05/21/2019).  A CLINICAL SUMMARY including a written plan identify barriers to care unique to individual due to social or financial issues and help create solutions together. and a patient's and the patient's families understanding of their medical issues and care needs   [place] [phone]

## 2019-04-26 ENCOUNTER — Encounter: Payer: Self-pay | Admitting: Family Medicine

## 2019-04-26 MED ORDER — PREDNISONE 50 MG PO TABS: 50.0000 mg | ORAL_TABLET | Freq: Every day | ORAL | 0 refills | Status: DC

## 2019-04-26 NOTE — Assessment & Plan Note (Signed)
Back pain is worsening.  Ordered prednisone 50 mg once daily for 5 days.  Continue physical therapy.  We will order a lumbar MRI.

## 2019-04-26 NOTE — Patient Instructions (Signed)
Right lumbar radiculopathy Back pain is worsening.  Ordered prednisone 50 mg once daily for 5 days.  Continue physical therapy.  We will order a lumbar MRI.  Psychogenic syncope Cause is unclear.  Had a very thorough work-up.  I am concerned the patient is in fact falling asleep due to her severe fatigue and lack of sleep every night.  I strongly recommend the patient work on getting 8 hours of sleep per night.

## 2019-04-26 NOTE — Assessment & Plan Note (Signed)
Cause is unclear.  Had a very thorough work-up.  I am concerned the patient is in fact falling asleep due to her severe fatigue and lack of sleep every night.  I strongly recommend the patient work on getting 8 hours of sleep per night.

## 2019-04-30 DIAGNOSIS — R05 Cough: Secondary | ICD-10-CM | POA: Diagnosis not present

## 2019-04-30 DIAGNOSIS — I959 Hypotension, unspecified: Secondary | ICD-10-CM | POA: Diagnosis not present

## 2019-04-30 DIAGNOSIS — R0789 Other chest pain: Secondary | ICD-10-CM | POA: Diagnosis not present

## 2019-04-30 DIAGNOSIS — R079 Chest pain, unspecified: Secondary | ICD-10-CM | POA: Diagnosis not present

## 2019-04-30 DIAGNOSIS — R457 State of emotional shock and stress, unspecified: Secondary | ICD-10-CM | POA: Diagnosis not present

## 2019-05-04 DIAGNOSIS — M5416 Radiculopathy, lumbar region: Secondary | ICD-10-CM | POA: Diagnosis not present

## 2019-05-04 DIAGNOSIS — M545 Low back pain: Secondary | ICD-10-CM | POA: Diagnosis not present

## 2019-05-05 DIAGNOSIS — Z20828 Contact with and (suspected) exposure to other viral communicable diseases: Secondary | ICD-10-CM | POA: Diagnosis not present

## 2019-05-05 DIAGNOSIS — R05 Cough: Secondary | ICD-10-CM | POA: Diagnosis not present

## 2019-05-06 ENCOUNTER — Encounter: Payer: Self-pay | Admitting: Family Medicine

## 2019-05-06 ENCOUNTER — Ambulatory Visit: Payer: Medicare Other

## 2019-05-06 ENCOUNTER — Telehealth (INDEPENDENT_AMBULATORY_CARE_PROVIDER_SITE_OTHER): Payer: BC Managed Care – PPO | Admitting: Family Medicine

## 2019-05-06 ENCOUNTER — Other Ambulatory Visit: Payer: Self-pay | Admitting: Legal Medicine

## 2019-05-06 ENCOUNTER — Other Ambulatory Visit: Payer: Self-pay | Admitting: Family Medicine

## 2019-05-06 VITALS — BP 126/82 | HR 88 | Temp 99.7°F | Wt 92.8 lb

## 2019-05-06 DIAGNOSIS — R058 Other specified cough: Secondary | ICD-10-CM | POA: Insufficient documentation

## 2019-05-06 DIAGNOSIS — J441 Chronic obstructive pulmonary disease with (acute) exacerbation: Secondary | ICD-10-CM

## 2019-05-06 DIAGNOSIS — Z7189 Other specified counseling: Secondary | ICD-10-CM | POA: Diagnosis not present

## 2019-05-06 DIAGNOSIS — M5416 Radiculopathy, lumbar region: Secondary | ICD-10-CM

## 2019-05-06 DIAGNOSIS — R05 Cough: Secondary | ICD-10-CM | POA: Diagnosis not present

## 2019-05-06 DIAGNOSIS — Z20822 Contact with and (suspected) exposure to covid-19: Secondary | ICD-10-CM

## 2019-05-06 LAB — POC COVID19 BINAXNOW: SARS Coronavirus 2 Ag: NEGATIVE

## 2019-05-06 MED ORDER — PREDNISONE 50 MG PO TABS: 50.0000 mg | ORAL_TABLET | Freq: Every day | ORAL | 0 refills | Status: DC

## 2019-05-06 MED ORDER — AZITHROMYCIN 250 MG PO TABS: ORAL_TABLET | ORAL | 0 refills | Status: DC

## 2019-05-06 NOTE — Progress Notes (Signed)
Virtual Visit via Telephone Note   This visit type was conducted due to national recommendations for restrictions regarding the COVID-19 Pandemic (e.g. social distancing) in an effort to limit this patient's exposure and mitigate transmission in our community.  Due to her co-morbid illnesses, this patient is at least at moderate risk for complications without adequate follow up.  This format is felt to be most appropriate for this patient at this time.  The patient did not have access to video technology/had technical difficulties with video requiring transitioning to audio format only (telephone).  All issues noted in this document were discussed and addressed.  No physical exam could be performed with this format.  Patient verbally consented to a telehealth visit.   Date:  05/06/2019   ID:  Anne [name], DOB 06-06-1945, MRN [id number]  Patient Location: Home Provider Location: Office  PCP:  [name], MD   Evaluation Performed:  Follow-Up Visit  Chief Complaint:  cough  History of Present Illness:    Anne [name] is a 74 y.o. female with COUGH  The patient does have symptoms concerning for COVID-19 infection (fever, chills, cough, or new shortness of breath).   HPI  Patient is a 74 year old white female with severe COPD who presents via televisit following 1 day of cough congestion, shortness of breath, headaches, sore throat, and runny nose.  The patient was seen at the [facility] urgent care in [place] yesterday and they did a Covid PCR test.  No Covid rapid test was done at that time.  The patient has been using DayQuil for the cough.  She also is taking her Trelegy 1 inhalation daily.  She is using her Ventolin 2-3 times per day. The patient was recently seen in the emergency department on April 30, 2019 for chest pain.  The final diagnosis was costochondritis.  She was placed on prednisone 50 mg once daily for 5 days which has been completed.  Past Medical History:    Diagnosis Date  . Asthma    daily inhalers  . Breast cancer ([place]) 05/2015   left  . COPD (chronic obstructive pulmonary disease) ([place])    denies SOB with ADLs; no O2  . Dental crowns present   . Depression   . History of concussion 01/01/2014  . History of kidney stones   . History of TIA (transient ischemic attack) 10/2014  . Hyperlipidemia   . Insomnia   . Migraine   . Nasal congestion 05/26/2015   will finish z-pak 05/27/2015  . Nonproductive cough 05/26/2015  . PONV (postoperative nausea and vomiting)    "long time ago"  none recently  . RLS (restless legs syndrome)   . Tobacco abuse   . Vitamin D deficiency   . Wears dentures    lower  . Wears partial dentures    upper   Past Surgical History:  Procedure Laterality Date  . ABDOMINAL HYSTERECTOMY  2000   complete  . APPENDECTOMY  2000  . BREAST BIOPSY Left   . BREAST RECONSTRUCTION WITH PLACEMENT OF TISSUE EXPANDER AND FLEX HD (ACELLULAR HYDRATED DERMIS) Bilateral 06/02/2015   Procedure: BILATERAL BREAST RECONSTRUCTION WITH PLACEMENT OF TISSUE EXPANDER AND  ACELLULAR HYDRATED DERMIS;  Surgeon: Irene Limbo, MD;  Location: [facility];  Service: Plastics;  Laterality: Bilateral;  . CARPAL TUNNEL RELEASE Bilateral   . CATARACT EXTRACTION Bilateral 09/2013  . KNEE ARTHROSCOPY Left   . LOOP RECORDER INSERTION N/A 05/11/2016   Procedure: Loop Recorder Insertion;  Surgeon: Will Meredith Leeds, MD;  Location: [facility];  Service: Cardiovascular;  Laterality: N/A;  . MASTECTOMY W/ SENTINEL NODE BIOPSY Bilateral 06/02/2015   Procedure: BILATERAL MASTECTOMY WITH LEFT SENTINEL LYMPH NODE BIOPSY;  Surgeon: Stark Klein, MD;  Location: [facility];  Service: General;  Laterality: Bilateral;  . NASAL SEPTUM SURGERY     x 3  . PLANTAR FASCIA SURGERY Left   . REMOVAL OF BILATERAL TISSUE EXPANDERS WITH PLACEMENT OF BILATERAL BREAST IMPLANTS Bilateral 06/17/2015   Procedure: DEBRIDEMENT OF BILATERAL  MASTECTOMY FLAP WITH BILATERAL TISSUE EXPANDER EXCHANGE ;  Surgeon: Irene Limbo, MD;  Location: [facility];  Service: Plastics;  Laterality: Bilateral;  . REMOVAL OF TISSUE EXPANDER Bilateral 07/12/2015   Procedure: REMOVAL OF BILATERAL TISSUE EXPANDERS;  Surgeon: Irene Limbo, MD;  Location: [facility];  Service: Plastics;  Laterality: Bilateral;    Family History  Problem Relation Age of Onset  . Cervical cancer Mother 26  . Colon cancer Maternal Grandfather        mets to stomach; dx. 67-70  . Heart disease Father   . Prostate cancer Father 74  . Cervical cancer Maternal Grandmother        dx. 66s; treated with radium implant  . Lung cancer Sister 32       maternal half-sister dx. lung cancer, stage III  . Breast cancer Maternal Aunt   . Cervical cancer Sister 72       paternal half-sister; s/p TAH  . Spina bifida Grandchild   . Brain cancer Grandchild        grandson dx. at 20 mos, treated at [facility]  . Breast cancer Other 2       maternal half-sister's daughter  . Cancer Maternal Aunt        d. 47; unspecified type - "began at back area and moved to vital organs"  . Cancer Maternal Uncle         late 71s; unspecified type; "started at back and moved to vital organs"    No outpatient medications have been marked as taking for the 05/06/19 encounter (Video Visit) with [name], MD.     Allergies:   Rocephin [ceftriaxone sodium in dextrose], Clindamycin/lincomycin, and Lyrica [pregabalin]   Social History   Tobacco Use  . Smoking status: Light Tobacco Smoker    Packs/day: 0.25    Types: Cigarettes    Last attempt to quit: 05/04/2015    Years since quitting: 4.0  . Smokeless tobacco: Never Used  Substance Use Topics  . Alcohol use: Yes    Alcohol/week: 0.0 standard drinks    Comment: rarely  . Drug use: No     Family Hx: The patient's family history includes Brain cancer in her grandchild; Breast cancer in her maternal aunt; Breast cancer (age of  onset: 32) in an other family member; Cancer in her maternal aunt and maternal uncle; Cervical cancer in her maternal grandmother; Cervical cancer (age of onset: 21) in her mother; Cervical cancer (age of onset: 73) in her sister; Colon cancer in her maternal grandfather; Heart disease in her father; Lung cancer (age of onset: 60) in her sister; Prostate cancer (age of onset: 67) in her father; Spina bifida in her grandchild.  ROS:   Review of Systems  Constitutional: Negative for chills, diaphoresis, fever. Positive for fatigue.  HENT: Negative for ear pain. Respiratory: nonproductive cough.   Cardiovascular: chest pain (tightness) present. Denies palpitations.  Gastrointestinal: Negative for abdominal pain,  constipation, diarrhea, nausea and vomiting. Appetite is ok. She is both eating and drinking.  Musculoskeletal: Positive for muscle pain. Neurological: C/o headaches.   Labs/Other Tests and Data Reviewed:    Wt Readings from Last 3 Encounters:  05/06/19 92 lb 12.8 oz (42.1 kg)  01/02/19 94 lb (42.6 kg)  10/01/17 105 lb (47.6 kg)     Objective:    Vital Signs:  BP 126/82   Pulse 88   Temp 99.7 F (37.6 C)   Wt 92 lb 12.8 oz (42.1 kg)   BMI 17.53 kg/m    VITAL SIGNS:  reviewed GEN:  no acute distress RESPIRATORY:  coughing through out visit.   ASSESSMENT & PLAN:   COPD exacerbation ([place]) Continue current inhalers. Likely going to start prednisone, but will have patient come in for rapid covid 19 test.   Cough with exposure to COVID-19 virus Needs rapid covid 19 test. No pcr test is needed regardless of positivity as it was already done at [facility].    COVID-19 Education: The signs and symptoms of COVID-19 were discussed with the patient and how to seek care for testing (follow up with PCP or arrange E-visit). The importance of social distancing was discussed today.  Time:   Today, I have spent 15 minutes with the patient with telehealth technology  discussing the above problems.     Medication Adjustments/Labs and Tests Ordered: Current medicines are reviewed at length with the patient today.  Concerns regarding medicines are outlined above.   Tests Ordered: No orders of the defined types were placed in this encounter.   Medication Changes: No orders of the defined types were placed in this encounter.   Follow Up:  Virtual Visit  prn  Signed, [name], MD  05/06/2019 12:59 PM    [place]

## 2019-05-06 NOTE — Progress Notes (Signed)
Patient Name: Anne [name] Date of Birth: 10/01/1945 MRN:  [id number]  Anne [name] is a 74 y.o. yo female presenting for COVID-19 testing. She is being tested from the vehicle.  [name] is being tested due to cough, congestion, shortness of breath, headache, sore throat, and runny nose.  She had a virtual visit with Dr Tobie Poet earlier today.  Anne [name]'s COVID test is NEGATIVE.  She is aware of the results.   Erie Noe, LPN [id number] PM

## 2019-05-06 NOTE — Assessment & Plan Note (Signed)
Continue current inhalers. Likely going to start prednisone, but will have patient come in for rapid covid 19 test.

## 2019-05-06 NOTE — Assessment & Plan Note (Signed)
Needs rapid covid 19 test. No pcr test is needed regardless of positivity as it was already done at [facility].

## 2019-05-07 ENCOUNTER — Other Ambulatory Visit: Payer: Self-pay

## 2019-05-07 DIAGNOSIS — M5136 Other intervertebral disc degeneration, lumbar region: Secondary | ICD-10-CM

## 2019-05-11 ENCOUNTER — Other Ambulatory Visit: Payer: Self-pay

## 2019-05-11 DIAGNOSIS — M5416 Radiculopathy, lumbar region: Secondary | ICD-10-CM

## 2019-05-19 ENCOUNTER — Other Ambulatory Visit: Payer: Self-pay | Admitting: Family Medicine

## 2019-05-26 ENCOUNTER — Encounter: Payer: Self-pay | Admitting: Nurse Practitioner

## 2019-05-26 ENCOUNTER — Telehealth (INDEPENDENT_AMBULATORY_CARE_PROVIDER_SITE_OTHER): Payer: BC Managed Care – PPO | Admitting: Nurse Practitioner

## 2019-05-26 VITALS — BP 111/89 | HR 107 | Ht 62.0 in | Wt 97.2 lb

## 2019-05-26 DIAGNOSIS — J370 Chronic laryngitis: Secondary | ICD-10-CM | POA: Insufficient documentation

## 2019-05-26 DIAGNOSIS — J040 Acute laryngitis: Secondary | ICD-10-CM | POA: Diagnosis not present

## 2019-05-26 MED ORDER — SALINE SPRAY 0.65 % NA SOLN: 1.0000 | NASAL | 0 refills | Status: DC | PRN

## 2019-05-26 MED ORDER — BENZONATATE 100 MG PO CAPS: 100.0000 mg | ORAL_CAPSULE | Freq: Two times a day (BID) | ORAL | 0 refills | Status: DC | PRN

## 2019-05-26 MED ORDER — AZITHROMYCIN 250 MG PO TABS: ORAL_TABLET | ORAL | 0 refills | Status: DC

## 2019-05-26 NOTE — Assessment & Plan Note (Addendum)
Not well controlled, antipyretic, NSAID, educated patient to increase fluid,  Started Z-pack, Voice rest recommended. Patient knows to follow up as needed with worsening symptoms

## 2019-05-26 NOTE — Progress Notes (Signed)
TELEHEALTH  VISIT ENCOUNTER NOTE  I connected with Anne [name] on 06/12/19 at  2:45 PM EDT by telephone at home and verified that I am speaking with the correct person using two identifiers.   I discussed the limitations, risks, security and privacy concerns of performing an evaluation and management service by telephone and the availability of in person appointments. I also discussed with the patient that there may be a patient responsible charge related to this service. The patient expressed understanding and agreed to proceed.   History:  Anne [name] is a 74 y.o.  patient is been evaluated to day for laryngitis,  Past Medical History:  Diagnosis Date  . Asthma    daily inhalers  . Breast cancer ([place]) 05/2015   left  . COPD (chronic obstructive pulmonary disease) ([place])    denies SOB with ADLs; no O2  . Dental crowns present   . Depression   . History of concussion 01/01/2014  . History of kidney stones   . History of TIA (transient ischemic attack) 10/2014  . Hyperlipidemia   . Insomnia   . Migraine   . Nasal congestion 05/26/2015   will finish z-pak 05/27/2015  . Nonproductive cough 05/26/2015  . PONV (postoperative nausea and vomiting)    "long time ago"  none recently  . RLS (restless legs syndrome)   . Tobacco abuse   . Vitamin D deficiency   . Wears dentures    lower  . Wears partial dentures    upper   Past Surgical History:  Procedure Laterality Date  . ABDOMINAL HYSTERECTOMY  2000   complete  . APPENDECTOMY  2000  . BREAST BIOPSY Left   . BREAST RECONSTRUCTION WITH PLACEMENT OF TISSUE EXPANDER AND FLEX HD (ACELLULAR HYDRATED DERMIS) Bilateral 06/02/2015   Procedure: BILATERAL BREAST RECONSTRUCTION WITH PLACEMENT OF TISSUE EXPANDER AND  ACELLULAR HYDRATED DERMIS;  Surgeon: Irene Limbo, MD;  Location: [place];  Service: Plastics;  Laterality: Bilateral;  . CARPAL TUNNEL RELEASE Bilateral   . CATARACT EXTRACTION Bilateral 09/2013  . KNEE  ARTHROSCOPY Left   . LOOP RECORDER INSERTION N/A 05/11/2016   Procedure: Loop Recorder Insertion;  Surgeon: Will Meredith Leeds, MD;  Location: [facility];  Service: Cardiovascular;  Laterality: N/A;  . MASTECTOMY W/ SENTINEL NODE BIOPSY Bilateral 06/02/2015   Procedure: BILATERAL MASTECTOMY WITH LEFT SENTINEL LYMPH NODE BIOPSY;  Surgeon: Stark Klein, MD;  Location: [place];  Service: General;  Laterality: Bilateral;  . NASAL SEPTUM SURGERY     x 3  . PLANTAR FASCIA SURGERY Left   . REMOVAL OF BILATERAL TISSUE EXPANDERS WITH PLACEMENT OF BILATERAL BREAST IMPLANTS Bilateral 06/17/2015   Procedure: DEBRIDEMENT OF BILATERAL MASTECTOMY FLAP WITH BILATERAL TISSUE EXPANDER EXCHANGE ;  Surgeon: Irene Limbo, MD;  Location: [place];  Service: Plastics;  Laterality: Bilateral;  . REMOVAL OF TISSUE EXPANDER Bilateral 07/12/2015   Procedure: REMOVAL OF BILATERAL TISSUE EXPANDERS;  Surgeon: Irene Limbo, MD;  Location: [place];  Service: Plastics;  Laterality: Bilateral;   The following portions of the patient's history were reviewed and updated as appropriate: allergies, current medications, past family history, past medical history, past social history, past surgical history and problem list.    Review of Systems:  Review of Systems  Constitutional: Positive for fever.  HENT: Positive for congestion and sore throat. Negative for hearing loss.   Eyes: Negative for blurred vision and pain.  Respiratory: Positive for cough.   Cardiovascular:  Negative for chest pain.  Gastrointestinal: Negative for abdominal pain and nausea.  Genitourinary: Negative for dysuria.  Musculoskeletal: Negative for joint pain and myalgias.  Skin: Negative for rash.  Neurological: Positive for headaches.  Psychiatric/Behavioral: The patient is nervous/anxious.    Cough This is a recurrent problem. The current episode started in the past 7 days. The problem has been rapidly  worsening. The problem occurs every few hours. Associated symptoms include a fever, headaches and a sore throat. Pertinent negatives include no chest pain, myalgias or rash. Nothing aggravates the symptoms. Risk factors for lung disease include smoking/tobacco exposure. She has tried OTC cough suppressant for the symptoms. The treatment provided no relief. Her past medical history is significant for COPD.     Physical Exam:  Laryngitis Not well controlled, antipyretic, NSAID, educated patient to increase fluid,  Started Z-pack, Voice rest recommended. Patient knows to follow up as needed with worsening symptoms  General:  Alert, oriented and cooperative.   Mental Status: Normal mood and affect perceived. Normal judgment and thought content.  Physical exam deferred due to nature of the encounter  Labs and Imaging No results found for this or any previous visit (from the past 336 hour(s)). No results found.    Assessment and Plan:     Laryngitis Not well controlled, antipyretic, NSAID, educated patient to increase fluid,  Started Z-pack, Voice rest recommended. Patient knows to follow up as needed with worsening symptoms  1. Laryngitis  - azithromycin (ZITHROMAX) 250 MG tablet; 2 tab day 1, 1 tab day 2, 3, 4, 5  Dispense: 6 tablet; Refill: 0 - benzonatate (TESSALON) 100 MG capsule; Take 1 capsule (100 mg total) by mouth 2 (two) times daily as needed for cough.  Dispense: 20 capsule; Refill: 0 - sodium chloride (OCEAN) 0.65 % SOLN nasal spray; Place 1 spray into both nostrils as needed for congestion.  Dispense: 30 mL; Refill: 0       I discussed the assessment and treatment plan with the patient. The patient was provided an opportunity to ask questions and all were answered. The patient agreed with the plan and demonstrated an understanding of the instructions.   The patient was advised to call back or seek an in-person evaluation/go to the ED if the symptoms worsen or if the condition  fails to improve as anticipated.  I provided 10 minutes of non-face-to-face time during this encounter.   Ivy Lynn, NP

## 2019-05-27 ENCOUNTER — Encounter: Payer: Self-pay | Admitting: Legal Medicine

## 2019-05-27 ENCOUNTER — Ambulatory Visit (INDEPENDENT_AMBULATORY_CARE_PROVIDER_SITE_OTHER): Payer: BC Managed Care – PPO | Admitting: Legal Medicine

## 2019-05-27 VITALS — BP 102/60 | HR 94 | Temp 98.7°F | Wt 100.2 lb

## 2019-05-27 DIAGNOSIS — J441 Chronic obstructive pulmonary disease with (acute) exacerbation: Secondary | ICD-10-CM | POA: Diagnosis not present

## 2019-05-27 MED ORDER — IPRATROPIUM-ALBUTEROL 0.5-2.5 (3) MG/3ML IN SOLN
3.0000 mL | Freq: Once | RESPIRATORY_TRACT | Status: AC
  Administered 2019-05-27: 3 mL via RESPIRATORY_TRACT

## 2019-05-27 MED ORDER — TRIAMCINOLONE ACETONIDE 40 MG/ML IJ SUSP
80.0000 mg | Freq: Once | INTRAMUSCULAR | Status: AC
  Administered 2019-05-27: 80 mg via INTRAMUSCULAR

## 2019-05-27 NOTE — Assessment & Plan Note (Signed)
Patient has a severe exacebation of her chronic COPD.  She was given duoneb which helped.  Kenalog 80mg  IM, use antibiotics and use all inhalers.  If worse, she is to call, may need prednisone pack, or possible hospitalization.  Note for work to return on Monday 3/29.

## 2019-05-27 NOTE — Progress Notes (Signed)
Acute Office Visit  Subjective:    Patient ID: Anne [name], female    DOB: 01-Apr-1945, 74 y.o.   MRN: [id number]  Chief Complaint  Patient presents with  . Cough    HPI Patient is in today for COPD with exacerbation.  She was seen video visit and put on zithromax, she is on ventolin HFA.  She continues to smoke.  She had first Covid injection.  No oxygen at home.She is having problems walking short distances. She is out of work and constantly coughing.  She had a videochat yesterday an dstarted on zithromax.  Her cough is nonproductive.  O2 sat 89%  Past Medical History:  Diagnosis Date  . Asthma    daily inhalers  . Breast cancer ([place] [facility]) 05/2015   left  . COPD (chronic obstructive pulmonary disease) ([place])    denies SOB with ADLs; no O2  . Dental crowns present   . Depression   . History of concussion 01/01/2014  . History of kidney stones   . History of TIA (transient ischemic attack) 10/2014  . Hyperlipidemia   . Insomnia   . Migraine   . Nasal congestion 05/26/2015   will finish z-pak 05/27/2015  . Nonproductive cough 05/26/2015  . PONV (postoperative nausea and vomiting)    "long time ago"  none recently  . RLS (restless legs syndrome)   . Tobacco abuse   . Vitamin D deficiency   . Wears dentures    lower  . Wears partial dentures    upper    Past Surgical History:  Procedure Laterality Date  . ABDOMINAL HYSTERECTOMY  2000   complete  . APPENDECTOMY  2000  . BREAST BIOPSY Left   . BREAST RECONSTRUCTION WITH PLACEMENT OF TISSUE EXPANDER AND FLEX HD (ACELLULAR HYDRATED DERMIS) Bilateral 06/02/2015   Procedure: BILATERAL BREAST RECONSTRUCTION WITH PLACEMENT OF TISSUE EXPANDER AND  ACELLULAR HYDRATED DERMIS;  Surgeon: Irene Limbo, MD;  Location: [place];  Service: Plastics;  Laterality: Bilateral;  . CARPAL TUNNEL RELEASE Bilateral   . CATARACT EXTRACTION Bilateral 09/2013  . KNEE ARTHROSCOPY Left   . LOOP RECORDER INSERTION N/A 05/11/2016   Procedure: Loop Recorder Insertion;  Surgeon: Will Meredith Leeds, MD;  Location: [facility];  Service: Cardiovascular;  Laterality: N/A;  . MASTECTOMY W/ SENTINEL NODE BIOPSY Bilateral 06/02/2015   Procedure: BILATERAL MASTECTOMY WITH LEFT SENTINEL LYMPH NODE BIOPSY;  Surgeon: Stark Klein, MD;  Location: [place];  Service: General;  Laterality: Bilateral;  . NASAL SEPTUM SURGERY     x 3  . PLANTAR FASCIA SURGERY Left   . REMOVAL OF BILATERAL TISSUE EXPANDERS WITH PLACEMENT OF BILATERAL BREAST IMPLANTS Bilateral 06/17/2015   Procedure: DEBRIDEMENT OF BILATERAL MASTECTOMY FLAP WITH BILATERAL TISSUE EXPANDER EXCHANGE ;  Surgeon: Irene Limbo, MD;  Location: [place];  Service: Plastics;  Laterality: Bilateral;  . REMOVAL OF TISSUE EXPANDER Bilateral 07/12/2015   Procedure: REMOVAL OF BILATERAL TISSUE EXPANDERS;  Surgeon: Irene Limbo, MD;  Location: [place];  Service: Plastics;  Laterality: Bilateral;    Family History  Problem Relation Age of Onset  . Cervical cancer Mother 91  . Colon cancer Maternal Grandfather        mets to stomach; dx. 67-70  . Heart disease Father   . Prostate cancer Father 27  . Cervical cancer Maternal Grandmother        dx. 21s; treated with radium implant  . Lung cancer Sister 64  maternal half-sister dx. lung cancer, stage III  . Breast cancer Maternal Aunt   . Cervical cancer Sister 34       paternal half-sister; s/p TAH  . Spina bifida Grandchild   . Brain cancer Grandchild        grandson dx. at 20 mos, [facility]  . Breast cancer Other 84       maternal half-sister's daughter  . Cancer Maternal Aunt        d. 66; unspecified type - "began at back area and moved to vital organs"  . Cancer Maternal Uncle         late 10s; unspecified type; "started at back and moved to vital organs"    Social History   Socioeconomic History  . Marital status: Widowed    Spouse name: Not on file  . Number of  children: 1  . Years of education: 85  . Highest education level: Not on file  Occupational History    Comment: [employer], retired  Tobacco Use  . Smoking status: Light Tobacco Smoker    Packs/day: 0.25    Types: Cigarettes    Last attempt to quit: 05/04/2015    Years since quitting: 4.0  . Smokeless tobacco: Never Used  Substance and Sexual Activity  . Alcohol use: Yes    Alcohol/week: 0.0 standard drinks    Comment: rarely  . Drug use: No  . Sexual activity: Not on file  Other Topics Concern  . Not on file  Social History Narrative   Lives alone, widow   Caffeine use- tea, 1 cup daily   Social Determinants of Health   Financial Resource Strain:   . Difficulty of Paying Living Expenses:   Food Insecurity:   . Worried About Charity fundraiser in the Last Year:   . Arboriculturist in the Last Year:   Transportation Needs:   . Film/video editor (Medical):   Marland Kitchen Lack of Transportation (Non-Medical):   Physical Activity:   . Days of Exercise per Week:   . Minutes of Exercise per Session:   Stress:   . Feeling of Stress :   Social Connections:   . Frequency of Communication with Friends and Family:   . Frequency of Social Gatherings with Friends and Family:   . Attends Religious Services:   . Active Member of Clubs or Organizations:   . Attends Archivist Meetings:   Marland Kitchen Marital Status:   Intimate Partner Violence:   . Fear of Current or Ex-Partner:   . Emotionally Abused:   Marland Kitchen Physically Abused:   . Sexually Abused:     Outpatient Medications Prior to Visit  Medication Sig Dispense Refill  . amitriptyline (ELAVIL) 75 MG tablet Take 1 tablet by mouth at bedtime.    Marland Kitchen atorvastatin (LIPITOR) 20 MG tablet Take 20 mg by mouth daily.    Marland Kitchen azithromycin (ZITHROMAX) 250 MG tablet 2 tab day 1, 1 tab day 2, 3, 4, 5 6 tablet 0  . benzonatate (TESSALON) 100 MG capsule Take 1 capsule (100 mg total) by mouth 2 (two) times daily as needed for cough. 20  capsule 0  . Cholecalciferol (VITAMIN D3) 50000 UNITS CAPS Take 50,000 Units by mouth every 30 (thirty) days.     . fenofibrate 160 MG tablet Take 160 mg by mouth daily.    . fluorometholone (FML) 0.1 % ophthalmic suspension     . Fluticasone-Umeclidin-Vilant 100-62.5-25 MCG/INH AEPB Inhale into the  lungs.    . furosemide (LASIX) 20 MG tablet Take 20 mg by mouth daily as needed for edema (for leg swelling).     . gabapentin (NEURONTIN) 300 MG capsule Take 1 capsule (300 mg total) by mouth 3 (three) times daily. 90 capsule 0  . methocarbamol (ROBAXIN) 500 MG tablet     . mirtazapine (REMERON) 45 MG tablet     . potassium chloride SA (KLOR-CON) 20 MEQ tablet TAKE 1 TABLET BY MOUTH ONCE DAILY WITH FOOD    . promethazine (PHENERGAN) 25 MG tablet Take 1 tablet by mouth as needed for nausea/vomiting.    . RESTASIS 0.05 % ophthalmic emulsion     . sodium chloride (OCEAN) 0.65 % SOLN nasal spray Place 1 spray into both nostrils as needed for congestion. 30 mL 0  . SUMAtriptan (IMITREX) 100 MG tablet Take 100 mg by mouth daily as needed for migraine (100 mg and then may repeat in 2 hours if headache persists).     . tamoxifen (NOLVADEX) 10 MG tablet     . tamoxifen (NOLVADEX) 20 MG tablet Take 20 mg by mouth at bedtime.     . temazepam (RESTORIL) 15 MG capsule Take 15 mg by mouth at bedtime.     Marland Kitchen tiZANidine (ZANAFLEX) 4 MG tablet     . traMADol (ULTRAM) 50 MG tablet TAKE 1 TABLET BY MOUTH EVERY 12 HOURS AS NEEDED FOR  SEVERE  ABDOMINAL  PAIN 60 tablet 0  . venlafaxine XR (EFFEXOR-XR) 75 MG 24 hr capsule TAKE 3 CAPSULES BY MOUTH ONCE DAILY 270 capsule 0  . VENTOLIN HFA 108 (90 BASE) MCG/ACT inhaler Inhale 1 puff into the lungs every 6 (six) hours as needed for wheezing or shortness of breath.     . Vitamin D, Ergocalciferol, (DRISDOL) 1.25 MG (50000 UT) CAPS capsule Take 1 capsule by mouth every 7 (seven) days.     No facility-administered medications prior to visit.    Allergies  Allergen  Reactions  . Rocephin [Ceftriaxone Sodium In Dextrose] Shortness Of Breath and Rash  . Clindamycin/Lincomycin Nausea And Vomiting  . Lyrica [Pregabalin] Other (See Comments)    PERSONALITY CHANGE    Review of Systems  Constitutional: Positive for diaphoresis.  HENT: Positive for congestion.   Respiratory: Positive for cough, shortness of breath and wheezing.   Cardiovascular: Negative.   Genitourinary: Negative.   Skin: Negative.   Neurological: Negative.   Psychiatric/Behavioral: Negative.        Objective:    Physical Exam  BP 102/60   Pulse 94   Temp 98.7 F (37.1 C)   Wt 100 lb 3.2 oz (45.5 kg)   SpO2 (!) 89%   BMI 18.33 kg/m  Wt Readings from Last 3 Encounters:  05/27/19 100 lb 3.2 oz (45.5 kg)  05/26/19 97 lb 3.2 oz (44.1 kg)  05/06/19 92 lb 12.8 oz (42.1 kg)    Health Maintenance Due  Topic Date Due  . Hepatitis C Screening  Never done  . MAMMOGRAM  Never done  . COLONOSCOPY  Never done  . DEXA SCAN  Never done    There are no preventive care reminders to display for this patient.   No results found for: TSH Lab Results  Component Value Date   WBC 6.7 07/03/2016   HGB 11.9 (L) 07/03/2016   HCT 37.3 07/03/2016   MCV 91.2 07/03/2016   PLT 164 07/03/2016   Lab Results  Component Value Date   NA 144 07/03/2016  K 3.9 07/03/2016   CO2 21 (L) 07/03/2016   GLUCOSE 119 (H) 07/03/2016   BUN 11 07/03/2016   CREATININE 0.76 07/03/2016   BILITOT 0.4 07/03/2016   ALKPHOS 59 07/03/2016   AST 23 07/03/2016   ALT 18 07/03/2016   PROT 5.1 (L) 07/03/2016   ALBUMIN 2.9 (L) 07/03/2016   CALCIUM 7.7 (L) 07/03/2016   ANIONGAP 3 (L) 07/03/2016   No results found for: CHOL No results found for: HDL No results found for: LDLCALC No results found for: TRIG No results found for: CHOLHDL No results found for: HGBA1C     Assessment & Plan:   Problem List Items Addressed This Visit      Respiratory   COPD exacerbation ([place]) - Primary    Patient has a  severe exacebation of her chronic COPD.  She was given duoneb which helped.  Kenalog 80mg  IM, use antibiotics and use all inhalers.  If worse, she is to call, may need prednisone pack, or possible hospitalization.  Note for work to return on Monday 3/29.      Relevant Medications   Fluticasone-Umeclidin-Vilant 100-62.5-25 MCG/INH AEPB   Other Relevant Orders   PR INHAL RX, AIRWAY OBST/DX SPUTUM INDUCT     Return in about 1 week (around 06/03/2019), or folow up.  Meds ordered this encounter  Medications  . triamcinolone acetonide (KENALOG-40) injection 80 mg  . ipratropium-albuterol (DUONEB) 0.5-2.5 (3) MG/3ML nebulizer solution 3 mL     Reinaldo Meeker, MD

## 2019-05-29 ENCOUNTER — Other Ambulatory Visit: Payer: Self-pay | Admitting: Family Medicine

## 2019-05-29 MED ORDER — PREDNISONE 5 MG PO TABS: 5.0000 mg | ORAL_TABLET | Freq: Every day | ORAL | 0 refills | Status: DC

## 2019-06-08 NOTE — Progress Notes (Signed)
Established Patient Office Visit  Subjective:  Patient ID: Anne [name], female    DOB: 11/06/1945  Age: 74 y.o. MRN: [id number]  CC:  Chief Complaint  Patient presents with  . Sore Throat    No Improvement  . Cough    HPI Nalaysia L Glacken presents c/o shortness of breath, wheezing, cough, sore throat, and rhinorrhea. Pt has history of copd and has been treated twice in the last 4 weeks with zpack and prednisone (x2.)  She is very close to running out of her trelegy. She was wheezing on arrival and was given  duoneb treatment which helped. She is also complaining of thrush. Patient has received both covid 19 vaccines.   Past Medical History:  Diagnosis Date  . Asthma    daily inhalers  . Breast cancer ([place]) 05/2015   left  . COPD (chronic obstructive pulmonary disease) ([place])    denies SOB with ADLs; no O2  . Dental crowns present   . Depression   . History of concussion 01/01/2014  . History of kidney stones   . History of TIA (transient ischemic attack) 10/2014  . Hyperlipidemia   . Insomnia   . Migraine   . Nasal congestion 05/26/2015   will finish z-pak 05/27/2015  . Nonproductive cough 05/26/2015  . PONV (postoperative nausea and vomiting)    "long time ago"  none recently  . RLS (restless legs syndrome)   . Tobacco abuse   . Vitamin D deficiency   . Wears dentures    lower  . Wears partial dentures    upper    Past Surgical History:  Procedure Laterality Date  . ABDOMINAL HYSTERECTOMY  2000   complete  . APPENDECTOMY  2000  . BREAST BIOPSY Left   . BREAST RECONSTRUCTION WITH PLACEMENT OF TISSUE EXPANDER AND FLEX HD (ACELLULAR HYDRATED DERMIS) Bilateral 06/02/2015   Procedure: BILATERAL BREAST RECONSTRUCTION WITH PLACEMENT OF TISSUE EXPANDER AND  ACELLULAR HYDRATED DERMIS;  Surgeon: Irene Limbo, MD;  Location: [facility];  Service: Plastics;  Laterality: Bilateral;  . CARPAL TUNNEL RELEASE Bilateral   . CATARACT EXTRACTION Bilateral 09/2013    . KNEE ARTHROSCOPY Left   . LOOP RECORDER INSERTION N/A 05/11/2016   Procedure: Loop Recorder Insertion;  Surgeon: Will Meredith Leeds, MD;  Location: [facility];  Service: Cardiovascular;  Laterality: N/A;  . MASTECTOMY W/ SENTINEL NODE BIOPSY Bilateral 06/02/2015   Procedure: BILATERAL MASTECTOMY WITH LEFT SENTINEL LYMPH NODE BIOPSY;  Surgeon: Stark Klein, MD;  Location: [facility];  Service: General;  Laterality: Bilateral;  . NASAL SEPTUM SURGERY     x 3  . PLANTAR FASCIA SURGERY Left   . REMOVAL OF BILATERAL TISSUE EXPANDERS WITH PLACEMENT OF BILATERAL BREAST IMPLANTS Bilateral 06/17/2015   Procedure: DEBRIDEMENT OF BILATERAL MASTECTOMY FLAP WITH BILATERAL TISSUE EXPANDER EXCHANGE ;  Surgeon: Irene Limbo, MD;  Location: [facility];  Service: Plastics;  Laterality: Bilateral;  . REMOVAL OF TISSUE EXPANDER Bilateral 07/12/2015   Procedure: REMOVAL OF BILATERAL TISSUE EXPANDERS;  Surgeon: Irene Limbo, MD;  Location: [facility];  Service: Plastics;  Laterality: Bilateral;    Family History  Problem Relation Age of Onset  . Cervical cancer Mother 36  . Colon cancer Maternal Grandfather        mets to stomach; dx. 67-70  . Heart disease Father   . Prostate cancer Father 91  . Cervical cancer Maternal Grandmother        dx. 23s;  treated with radium implant  . Lung cancer Sister 37       maternal half-sister dx. lung cancer, stage III  . Breast cancer Maternal Aunt   . Cervical cancer Sister 24       paternal half-sister; s/p TAH  . Spina bifida Grandchild   . Brain cancer Grandchild        grandson dx. at 20 mos, [facility] at [facility]  . Breast cancer Other 62       maternal half-sister's daughter  . Cancer Maternal Aunt        d. 2; unspecified type - "began at back area and moved to vital organs"  . Cancer Maternal Uncle         late 38s; unspecified type; "started at back and moved to vital organs"    Social History   Socioeconomic History   . Marital status: Widowed    Spouse name: Not on file  . Number of children: 1  . Years of education: 41  . Highest education level: Not on file  Occupational History    Comment: [employer], retired  Tobacco Use  . Smoking status: Light Tobacco Smoker    Packs/day: 0.25    Types: Cigarettes    Last attempt to quit: 05/04/2015    Years since quitting: 4.1  . Smokeless tobacco: Never Used  Substance and Sexual Activity  . Alcohol use: Yes    Alcohol/week: 0.0 standard drinks    Comment: rarely  . Drug use: No  . Sexual activity: Not on file  Other Topics Concern  . Not on file  Social History Narrative   Lives alone, widow   Caffeine use- tea, 1 cup daily   Social Determinants of Health   Financial Resource Strain:   . Difficulty of Paying Living Expenses:   Food Insecurity:   . Worried About Charity fundraiser in the Last Year:   . Arboriculturist in the Last Year:   Transportation Needs:   . Film/video editor (Medical):   Marland Kitchen Lack of Transportation (Non-Medical):   Physical Activity:   . Days of Exercise per Week:   . Minutes of Exercise per Session:   Stress:   . Feeling of Stress :   Social Connections:   . Frequency of Communication with Friends and Family:   . Frequency of Social Gatherings with Friends and Family:   . Attends Religious Services:   . Active Member of Clubs or Organizations:   . Attends Archivist Meetings:   Marland Kitchen Marital Status:   Intimate Partner Violence:   . Fear of Current or Ex-Partner:   . Emotionally Abused:   Marland Kitchen Physically Abused:   . Sexually Abused:     Outpatient Medications Prior to Visit  Medication Sig Dispense Refill  . amitriptyline (ELAVIL) 75 MG tablet Take 1 tablet by mouth at bedtime.    Marland Kitchen atorvastatin (LIPITOR) 20 MG tablet Take 20 mg by mouth daily.    . benzonatate (TESSALON) 100 MG capsule Take 1 capsule (100 mg total) by mouth 2 (two) times daily as needed for cough. 20 capsule 0  .  Cholecalciferol (VITAMIN D3) 50000 UNITS CAPS Take 50,000 Units by mouth every 30 (thirty) days.     . fenofibrate 160 MG tablet Take 160 mg by mouth daily.    . fluorometholone (FML) 0.1 % ophthalmic suspension     . Fluticasone-Umeclidin-Vilant 100-62.5-25 MCG/INH AEPB Inhale into the lungs.    Marland Kitchen  furosemide (LASIX) 20 MG tablet Take 20 mg by mouth daily as needed for edema (for leg swelling).     . gabapentin (NEURONTIN) 300 MG capsule Take 1 capsule (300 mg total) by mouth 3 (three) times daily. 90 capsule 0  . methocarbamol (ROBAXIN) 500 MG tablet     . mirtazapine (REMERON) 45 MG tablet     . potassium chloride SA (KLOR-CON) 20 MEQ tablet TAKE 1 TABLET BY MOUTH ONCE DAILY WITH FOOD    . promethazine (PHENERGAN) 25 MG tablet Take 1 tablet by mouth as needed for nausea/vomiting.    . RESTASIS 0.05 % ophthalmic emulsion     . sodium chloride (OCEAN) 0.65 % SOLN nasal spray Place 1 spray into both nostrils as needed for congestion. 30 mL 0  . SUMAtriptan (IMITREX) 100 MG tablet Take 100 mg by mouth daily as needed for migraine (100 mg and then may repeat in 2 hours if headache persists).     . tamoxifen (NOLVADEX) 10 MG tablet     . tamoxifen (NOLVADEX) 20 MG tablet Take 20 mg by mouth at bedtime.     . temazepam (RESTORIL) 15 MG capsule TAKE 1 CAPSULE BY MOUTH ONCE DAILY AT BEDTIME AS NEEDED 30 capsule 0  . tiZANidine (ZANAFLEX) 4 MG tablet     . traMADol (ULTRAM) 50 MG tablet TAKE 1 TABLET BY MOUTH EVERY 12 HOURS AS NEEDED FOR  SEVERE  ABDOMINAL  PAIN 60 tablet 0  . venlafaxine XR (EFFEXOR-XR) 75 MG 24 hr capsule TAKE 3 CAPSULES BY MOUTH ONCE DAILY 270 capsule 0  . VENTOLIN HFA 108 (90 BASE) MCG/ACT inhaler Inhale 1 puff into the lungs every 6 (six) hours as needed for wheezing or shortness of breath.     . Vitamin D, Ergocalciferol, (DRISDOL) 1.25 MG (50000 UT) CAPS capsule Take 1 capsule by mouth every 7 (seven) days.    Marland Kitchen azithromycin (ZITHROMAX) 250 MG tablet 2 tab day 1, 1 tab day 2, 3,  4, 5 6 tablet 0  . predniSONE (DELTASONE) 5 MG tablet Take 1 tablet (5 mg total) by mouth daily. Take 6 tablets first day, then 5 tablets second day, then 4 tablets third day,then 3 tablets fourth day, then take 2 tablets fifth day and then one tablet last day. 21 tablet 0  . traZODone (DESYREL) 100 MG tablet      No facility-administered medications prior to visit.    Allergies  Allergen Reactions  . Rocephin [Ceftriaxone Sodium In Dextrose] Shortness Of Breath and Rash  . Clindamycin/Lincomycin Nausea And Vomiting  . Lyrica [Pregabalin] Other (See Comments)    PERSONALITY CHANGE    ROS Review of Systems  Constitutional: Negative for chills, fatigue and fever.  HENT: Positive for sore throat and voice change. Negative for congestion, ear pain and rhinorrhea.   Respiratory: Positive for cough, shortness of breath and wheezing.   Cardiovascular: Negative for chest pain.  Musculoskeletal: Positive for back pain.  Neurological: Positive for headaches.       Occasional. Has migraines which are fairly well controlled.   Psychiatric/Behavioral: Positive for sleep disturbance.       Chronic insomnia. Pt says she has not slept but 2 hours the previous night. I have tried nearly all sleep medicines with minimal or temporary success. I have requested in the past to send her to a sleep specialist, but she has refused. She agrees to go now.       Objective:    Physical Exam  Constitutional:  She appears well-developed and well-nourished.  HENT:  Right Ear: External ear normal.  Left Ear: External ear normal.  Nose: Nose normal.  Mouth/Throat: No oropharyngeal exudate.  Thrush on palate. Throat appears dry and red.   Cardiovascular: Normal rate, regular rhythm and normal heart sounds.  Pulmonary/Chest: Effort normal. No respiratory distress. She has wheezes.  Neurological: She is alert.  Psychiatric: She has a normal mood and affect. Her behavior is normal.  Wheezes are mild and she is  feeling much better after duoneb.  BP 136/64   Pulse 80   Temp 97.8 F (36.6 C)   Resp 18   Ht 5\' 1"  (1.549 m)   Wt 97 lb (44 kg)   BMI 18.33 kg/m  Wt Readings from Last 3 Encounters:  06/09/19 97 lb (44 kg)  05/27/19 100 lb 3.2 oz (45.5 kg)  05/26/19 97 lb 3.2 oz (44.1 kg)     Health Maintenance Due  Topic Date Due  . Hepatitis C Screening  Never done  . MAMMOGRAM  Never done  . COLONOSCOPY  Never done  . DEXA SCAN  Never done    There are no preventive care reminders to display for this patient.  No results found for: TSH Lab Results  Component Value Date   WBC 6.7 07/03/2016   HGB 11.9 (L) 07/03/2016   HCT 37.3 07/03/2016   MCV 91.2 07/03/2016   PLT 164 07/03/2016   Lab Results  Component Value Date   NA 144 07/03/2016   K 3.9 07/03/2016   CO2 21 (L) 07/03/2016   GLUCOSE 119 (H) 07/03/2016   BUN 11 07/03/2016   CREATININE 0.76 07/03/2016   BILITOT 0.4 07/03/2016   ALKPHOS 59 07/03/2016   AST 23 07/03/2016   ALT 18 07/03/2016   PROT 5.1 (L) 07/03/2016   ALBUMIN 2.9 (L) 07/03/2016   CALCIUM 7.7 (L) 07/03/2016   ANIONGAP 3 (L) 07/03/2016   No results found for: CHOL No results found for: HDL No results found for: LDLCALC No results found for: TRIG No results found for: CHOLHDL No results found for: HGBA1C    Assessment & Plan:  1. COPD exacerbation (HCC) Levaquin 500 mg once daily x 10 days.  Prednisone taper. Continue trelegy one inhalation daily.  Refer to Dr. Annamaria Boots for pulmonary evaluation. Patient to see Lysle Rubens, Pharmacist to help with medication management and cost.   2. Acute laryngopharyngitis Gargle warm salt water.   3. Oral thrush Clotrimazole troches  4. Primary insomnia - patient has been tried on numerous sedatives to help with sleep and continues to have sleep issues. Refer to Dr. Annamaria Boots for sleep evaluation.   Meds ordered this encounter  Medications  . levofloxacin (LEVAQUIN) 500 MG tablet    Sig: Take 1  tablet (500 mg total) by mouth 2 (two) times daily.    Dispense:  20 tablet    Refill:  0  . clotrimazole (MYCELEX) 10 MG troche    Sig: Take 1 tablet (10 mg total) by mouth 5 (five) times daily.    Dispense:  50 Troche    Refill:  0  . predniSONE (DELTASONE) 20 MG tablet    Sig: Take 1 tablet (20 mg total) by mouth in the morning, at noon, and at bedtime for 3 days, THEN 1 tablet (20 mg total) in the morning and at bedtime for 3 days, THEN 1 tablet (20 mg total) daily for 3 days.    Dispense:  18 tablet  Refill:  0  . DISCONTD: HYDROcodone-acetaminophen (VICODIN) 5-325 mg TABS tablet    Sig: Take 6 tablets by mouth 2 (two) times daily as needed (severe headaches). Take one tablet twice a day as needed for severe headaches. Remove 6 tablets po bid.    Dispense:  30 tablet    Refill:  0  . traZODone (DESYREL) 100 MG tablet    Sig: Take 1 tablet (100 mg total) by mouth at bedtime as needed for sleep.    Dispense:  90 tablet    Refill:  0  . DISCONTD: HYDROcodone-acetaminophen (VICODIN) 5-325 mg TABS tablet    Sig: Take one tablet twice a day as needed for severe headaches. Remove 6 tablets po bid.    Dispense:  30 tablet    Refill:  0  . DISCONTD: HYDROcodone-acetaminophen (VICODIN) 5-325 mg TABS tablet    Sig: Take one tablet twice a day as needed for severe headaches. Remove 6 tablets po bid.    Dispense:  30 tablet    Refill:  0  . HYDROcodone-acetaminophen (VICODIN) 5-325 mg TABS tablet    Sig: Take one tablet twice a day as needed for severe headaches. Remove 6 tablets po bid.    Dispense:  30 tablet    Refill:  0  . ipratropium-albuterol (DUONEB) 0.5-2.5 (3) MG/3ML nebulizer solution 3 mL    Follow-up: Return in about 1 week (around 06/16/2019) for previously scheduled appt.    [name], MD

## 2019-06-09 ENCOUNTER — Other Ambulatory Visit: Payer: Self-pay

## 2019-06-09 ENCOUNTER — Ambulatory Visit (INDEPENDENT_AMBULATORY_CARE_PROVIDER_SITE_OTHER): Payer: BC Managed Care – PPO | Admitting: Family Medicine

## 2019-06-09 ENCOUNTER — Encounter: Payer: Self-pay | Admitting: Family Medicine

## 2019-06-09 VITALS — BP 136/64 | HR 80 | Temp 97.8°F | Resp 18 | Ht 61.0 in | Wt 97.0 lb

## 2019-06-09 DIAGNOSIS — B370 Candidal stomatitis: Secondary | ICD-10-CM | POA: Diagnosis not present

## 2019-06-09 DIAGNOSIS — F5101 Primary insomnia: Secondary | ICD-10-CM

## 2019-06-09 DIAGNOSIS — J060 Acute laryngopharyngitis: Secondary | ICD-10-CM | POA: Insufficient documentation

## 2019-06-09 DIAGNOSIS — J441 Chronic obstructive pulmonary disease with (acute) exacerbation: Secondary | ICD-10-CM | POA: Diagnosis not present

## 2019-06-09 MED ORDER — IPRATROPIUM-ALBUTEROL 0.5-2.5 (3) MG/3ML IN SOLN: 3.0000 mL | Freq: Once | RESPIRATORY_TRACT | Status: DC

## 2019-06-09 MED ORDER — HYDROCODONE-ACETAMINOPHEN 5-325MG PREPACK (~~LOC~~: ORAL_TABLET | ORAL | 0 refills | Status: DC

## 2019-06-09 MED ORDER — LEVOFLOXACIN 500 MG PO TABS: 500.0000 mg | ORAL_TABLET | Freq: Two times a day (BID) | ORAL | 0 refills | Status: DC

## 2019-06-09 MED ORDER — TRAZODONE HCL 100 MG PO TABS: 100.0000 mg | ORAL_TABLET | Freq: Every evening | ORAL | 0 refills | Status: DC | PRN

## 2019-06-09 MED ORDER — HYDROCODONE-ACETAMINOPHEN 5-325MG PREPACK (~~LOC~~: 1.0000 | ORAL_TABLET | Freq: Two times a day (BID) | ORAL | 0 refills | Status: DC | PRN

## 2019-06-09 MED ORDER — PREDNISONE 20 MG PO TABS: ORAL_TABLET | ORAL | 0 refills | Status: AC

## 2019-06-09 MED ORDER — CLOTRIMAZOLE 10 MG MT TROC: 10.0000 mg | Freq: Every day | OROMUCOSAL | 0 refills | Status: DC

## 2019-06-17 ENCOUNTER — Ambulatory Visit: Payer: Medicare Other | Admitting: Family Medicine

## 2019-06-18 ENCOUNTER — Ambulatory Visit: Payer: Medicare Other | Admitting: Family Medicine

## 2019-06-24 ENCOUNTER — Other Ambulatory Visit: Payer: Self-pay | Admitting: Family Medicine

## 2019-06-25 ENCOUNTER — Telehealth: Payer: Self-pay | Admitting: Family Medicine

## 2019-06-25 NOTE — Progress Notes (Signed)
  Chronic Care Management   Outreach Note  06/25/2019 Name: Anne [name] MRN: [id number] DOB: 1945/12/28  Referred by: [name], MD Reason for referral : No chief complaint on file.   An unsuccessful telephone outreach was attempted today. The patient was referred to the pharmacist for assistance with care management and care coordination.   Follow Up Plan:   Earney Hamburg Upstream Scheduler

## 2019-06-26 ENCOUNTER — Other Ambulatory Visit: Payer: Self-pay

## 2019-06-26 ENCOUNTER — Ambulatory Visit (INDEPENDENT_AMBULATORY_CARE_PROVIDER_SITE_OTHER): Payer: BC Managed Care – PPO | Admitting: Family Medicine

## 2019-06-26 ENCOUNTER — Encounter: Payer: Self-pay | Admitting: Family Medicine

## 2019-06-26 VITALS — BP 130/80 | HR 96 | Temp 98.3°F | Resp 18 | Ht 61.0 in | Wt 94.6 lb

## 2019-06-26 DIAGNOSIS — J410 Simple chronic bronchitis: Secondary | ICD-10-CM

## 2019-06-26 DIAGNOSIS — R1030 Lower abdominal pain, unspecified: Secondary | ICD-10-CM | POA: Diagnosis not present

## 2019-06-26 DIAGNOSIS — J301 Allergic rhinitis due to pollen: Secondary | ICD-10-CM

## 2019-06-26 LAB — POCT URINALYSIS DIPSTICK
Bilirubin, UA: NEGATIVE
Blood, UA: NEGATIVE
Glucose, UA: NEGATIVE
Ketones, UA: NEGATIVE
Leukocytes, UA: NEGATIVE
Nitrite, UA: NEGATIVE
Protein, UA: POSITIVE — AB
Spec Grav, UA: >=1.03 — AB (ref 1.010–1.025)
Urobilinogen, UA: 0.2 E.U./dL
pH, UA: 6 (ref 5.0–8.0)

## 2019-06-26 MED ORDER — CETIRIZINE HCL 10 MG PO TABS: 10.0000 mg | ORAL_TABLET | Freq: Every day | ORAL | 11 refills | Status: DC

## 2019-06-26 NOTE — Progress Notes (Signed)
Acute Office Visit  Subjective:    Patient ID: Anne [name], female    DOB: 1946/01/06, 74 y.o.   MRN: [id number]  Chief Complaint  Patient presents with  . Sore Throat  . Cough  . Hoarse  . left lower abdominal pain    HPI Patient is in today complaining of persistent coughing, sore throat, and hoarseness.  She has been treated 4 times since March 3 with prednisone, Kenalog shot, 3 antibiotics including a Z-Pak cefdinir, and Levaquin.  Each time she felt like she improved and then worsened again.  This time she has improved in terms of her breathing however now she is having upper respiratory symptoms.  She is concerned it might be allergies.  In addition she is having left lower quadrant abdominal pain and some constipation.  Past Medical History:  Diagnosis Date  . Asthma    daily inhalers  . Breast cancer ([place]) 05/2015   left  . COPD (chronic obstructive pulmonary disease) ([place])    denies SOB with ADLs; no O2  . Dental crowns present   . Depression   . History of concussion 01/01/2014  . History of kidney stones   . History of TIA (transient ischemic attack) 10/2014  . Hyperlipidemia   . Insomnia   . Migraine   . Nasal congestion 05/26/2015   will finish z-pak 05/27/2015  . Nonproductive cough 05/26/2015  . PONV (postoperative nausea and vomiting)    "long time ago"  none recently  . RLS (restless legs syndrome)   . Tobacco abuse   . Vitamin D deficiency   . Wears dentures    lower  . Wears partial dentures    upper    Past Surgical History:  Procedure Laterality Date  . ABDOMINAL HYSTERECTOMY  2000   complete  . APPENDECTOMY  2000  . BREAST BIOPSY Left   . BREAST RECONSTRUCTION WITH PLACEMENT OF TISSUE EXPANDER AND FLEX HD (ACELLULAR HYDRATED DERMIS) Bilateral 06/02/2015   Procedure: BILATERAL BREAST RECONSTRUCTION WITH PLACEMENT OF TISSUE EXPANDER AND  ACELLULAR HYDRATED DERMIS;  Surgeon: Irene Limbo, MD;  Location: [place];  Service:  Plastics;  Laterality: Bilateral;  . CARPAL TUNNEL RELEASE Bilateral   . CATARACT EXTRACTION Bilateral 09/2013  . KNEE ARTHROSCOPY Left   . LOOP RECORDER INSERTION N/A 05/11/2016   Procedure: Loop Recorder Insertion;  Surgeon: Will Meredith Leeds, MD;  Location: [facility];  Service: Cardiovascular;  Laterality: N/A;  . MASTECTOMY W/ SENTINEL NODE BIOPSY Bilateral 06/02/2015   Procedure: BILATERAL MASTECTOMY WITH LEFT SENTINEL LYMPH NODE BIOPSY;  Surgeon: Stark Klein, MD;  Location: [facility];  Service: General;  Laterality: Bilateral;  . NASAL SEPTUM SURGERY     x 3  . PLANTAR FASCIA SURGERY Left   . REMOVAL OF BILATERAL TISSUE EXPANDERS WITH PLACEMENT OF BILATERAL BREAST IMPLANTS Bilateral 06/17/2015   Procedure: DEBRIDEMENT OF BILATERAL MASTECTOMY FLAP WITH BILATERAL TISSUE EXPANDER EXCHANGE ;  Surgeon: Irene Limbo, MD;  Location: [place];  Service: Plastics;  Laterality: Bilateral;  . REMOVAL OF TISSUE EXPANDER Bilateral 07/12/2015   Procedure: REMOVAL OF BILATERAL TISSUE EXPANDERS;  Surgeon: Irene Limbo, MD;  Location: [facility];  Service: Plastics;  Laterality: Bilateral;    Family History  Problem Relation Age of Onset  . Cervical cancer Mother 54  . Colon cancer Maternal Grandfather        mets to stomach; dx. 67-70  . Heart disease Father   . Prostate cancer  Father 41  . Cervical cancer Maternal Grandmother        dx. 60s; treated with radium implant  . Lung cancer Sister 59       maternal half-sister dx. lung cancer, stage III  . Breast cancer Maternal Aunt   . Cervical cancer Sister 46       paternal half-sister; s/p TAH  . Spina bifida Grandchild   . Brain cancer Grandchild        grandson dx. at 20 mos, [facility]  . Breast cancer Other 67       maternal half-sister's daughter  . Cancer Maternal Aunt        d. 77; unspecified type - "began at back area and moved to vital organs"  . Cancer Maternal Uncle         late  26s; unspecified type; "started at back and moved to vital organs"    Social History   Socioeconomic History  . Marital status: Widowed    Spouse name: Not on file  . Number of children: 1  . Years of education: 77  . Highest education level: Not on file  Occupational History    Comment: [employer], retired  Tobacco Use  . Smoking status: Light Tobacco Smoker    Packs/day: 0.25    Types: Cigarettes    Last attempt to quit: 05/04/2015    Years since quitting: 4.1  . Smokeless tobacco: Never Used  Substance and Sexual Activity  . Alcohol use: Yes    Alcohol/week: 0.0 standard drinks    Comment: rarely  . Drug use: No  . Sexual activity: Not on file  Other Topics Concern  . Not on file  Social History Narrative   Lives alone, widow   Caffeine use- tea, 1 cup daily   Social Determinants of Health   Financial Resource Strain:   . Difficulty of Paying Living Expenses:   Food Insecurity:   . Worried About Charity fundraiser in the Last Year:   . Arboriculturist in the Last Year:   Transportation Needs:   . Film/video editor (Medical):   Marland Kitchen Lack of Transportation (Non-Medical):   Physical Activity:   . Days of Exercise per Week:   . Minutes of Exercise per Session:   Stress:   . Feeling of Stress :   Social Connections:   . Frequency of Communication with Friends and Family:   . Frequency of Social Gatherings with Friends and Family:   . Attends Religious Services:   . Active Member of Clubs or Organizations:   . Attends Archivist Meetings:   Marland Kitchen Marital Status:   Intimate Partner Violence:   . Fear of Current or Ex-Partner:   . Emotionally Abused:   Marland Kitchen Physically Abused:   . Sexually Abused:     Outpatient Medications Prior to Visit  Medication Sig Dispense Refill  . amitriptyline (ELAVIL) 75 MG tablet Take 1 tablet by mouth once daily 30 tablet 0  . atorvastatin (LIPITOR) 20 MG tablet Take 20 mg by mouth daily.    . Cholecalciferol  (VITAMIN D3) 50000 UNITS CAPS Take 50,000 Units by mouth once a week.     . clotrimazole (MYCELEX) 10 MG troche Take 1 tablet (10 mg total) by mouth 5 (five) times daily. 50 Troche 0  . fenofibrate 160 MG tablet Take 160 mg by mouth daily.    . fluorometholone (FML) 0.1 % ophthalmic suspension     .  Fluticasone-Umeclidin-Vilant 100-62.5-25 MCG/INH AEPB Inhale into the lungs.    . furosemide (LASIX) 20 MG tablet Take 20 mg by mouth daily as needed for edema (for leg swelling).     . gabapentin (NEURONTIN) 300 MG capsule Take 1 capsule (300 mg total) by mouth 3 (three) times daily. 90 capsule 0  . mirtazapine (REMERON) 45 MG tablet     . potassium chloride SA (KLOR-CON) 20 MEQ tablet TAKE 1 TABLET BY MOUTH ONCE DAILY WITH FOOD    . promethazine (PHENERGAN) 25 MG tablet Take 1 tablet by mouth as needed for nausea/vomiting.    . RESTASIS 0.05 % ophthalmic emulsion     . sodium chloride (OCEAN) 0.65 % SOLN nasal spray Place 1 spray into both nostrils as needed for congestion. 30 mL 0  . SUMAtriptan (IMITREX) 100 MG tablet Take 100 mg by mouth daily as needed for migraine (100 mg and then may repeat in 2 hours if headache persists).     . tamoxifen (NOLVADEX) 10 MG tablet     . temazepam (RESTORIL) 15 MG capsule TAKE 1 CAPSULE BY MOUTH ONCE DAILY AT BEDTIME AS NEEDED 30 capsule 0  . tiZANidine (ZANAFLEX) 4 MG tablet     . traMADol (ULTRAM) 50 MG tablet TAKE 1 TABLET BY MOUTH EVERY 12 HOURS AS NEEDED FOR SEVERE ABDOMINAL PAIN 60 tablet 0  . traZODone (DESYREL) 100 MG tablet Take 1 tablet (100 mg total) by mouth at bedtime as needed for sleep. 90 tablet 0  . venlafaxine XR (EFFEXOR-XR) 75 MG 24 hr capsule TAKE 3 CAPSULES BY MOUTH ONCE DAILY 270 capsule 0  . HYDROcodone-acetaminophen (VICODIN) 5-325 mg TABS tablet Take one tablet twice a day as needed for severe headaches. Remove 6 tablets po bid. 30 tablet 0  . VENTOLIN HFA 108 (90 BASE) MCG/ACT inhaler Inhale 1 puff into the lungs every 6 (six) hours as  needed for wheezing or shortness of breath.     . benzonatate (TESSALON) 100 MG capsule Take 1 capsule (100 mg total) by mouth 2 (two) times daily as needed for cough. 20 capsule 0  . levofloxacin (LEVAQUIN) 500 MG tablet Take 1 tablet (500 mg total) by mouth 2 (two) times daily. 20 tablet 0  . methocarbamol (ROBAXIN) 500 MG tablet     . tamoxifen (NOLVADEX) 20 MG tablet Take 20 mg by mouth at bedtime.     . Vitamin D, Ergocalciferol, (DRISDOL) 1.25 MG (50000 UT) CAPS capsule Take 1 capsule by mouth every 7 (seven) days.     Facility-Administered Medications Prior to Visit  Medication Dose Route Frequency Provider Last Rate Last Admin  . ipratropium-albuterol (DUONEB) 0.5-2.5 (3) MG/3ML nebulizer solution 3 mL  3 mL Nebulization Once [name], MD        Allergies  Allergen Reactions  . Rocephin [Ceftriaxone Sodium In Dextrose] Shortness Of Breath and Rash  . Clindamycin/Lincomycin Nausea And Vomiting  . Lyrica [Pregabalin] Other (See Comments)    PERSONALITY CHANGE    Review of Systems  Constitutional: Positive for fatigue. Negative for chills, diaphoresis and fever.  HENT: Positive for ear pain (left), rhinorrhea and voice change. Negative for sore throat.   Respiratory: Positive for cough. Negative for shortness of breath and wheezing.   Cardiovascular: Negative for chest pain.  Gastrointestinal: Positive for abdominal pain (intermittent). Negative for nausea and vomiting.       LLQ abdominal pain x 2 weeks. Staying same. Some constipation.   Genitourinary: Positive for flank pain. Negative for dysuria,  hematuria and urgency.       Left lower.  Musculoskeletal: Negative for myalgias.       Objective:    Physical Exam Vitals reviewed.  Constitutional:      Appearance: Normal appearance.     Comments: Thin.  HENT:     Right Ear: Tympanic membrane and ear canal normal. Tympanic membrane is not erythematous.     Left Ear: Tympanic membrane and ear canal normal. Tympanic  membrane is not erythematous.     Nose: No congestion.     Mouth/Throat:     Mouth: Mucous membranes are moist.     Pharynx: No posterior oropharyngeal erythema.  Cardiovascular:     Rate and Rhythm: Normal rate and regular rhythm.     Pulses: Normal pulses.     Heart sounds: Normal heart sounds. No murmur.  Pulmonary:     Effort: Pulmonary effort is normal. No respiratory distress.     Breath sounds: Normal breath sounds.  Abdominal:     General: Abdomen is flat. Bowel sounds are normal.     Palpations: Abdomen is soft.     Tenderness: There is abdominal tenderness (LLQ - mild. RUQ baseline.).  Neurological:     Mental Status: She is alert and oriented to person, place, and time.  Psychiatric:        Mood and Affect: Mood normal.        Behavior: Behavior normal.    BP 130/80   Pulse 96   Temp 98.3 F (36.8 C)   Resp 18   Ht 5\' 1"  (1.549 m)   Wt 94 lb 9.6 oz (42.9 kg)   BMI 17.87 kg/m  Wt Readings from Last 3 Encounters:  06/26/19 94 lb 9.6 oz (42.9 kg)  06/09/19 97 lb (44 kg)  05/27/19 100 lb 3.2 oz (45.5 kg)    Health Maintenance Due  Topic Date Due  . Hepatitis C Screening  Never done  . COVID-19 Vaccine (1) Never done  . MAMMOGRAM  Never done  . DEXA SCAN  Never done   Assessment & Plan:  1. Lower abdominal pain Monitor.  - POCT urinalysis dipstick - normal.  2. Simple chronic bronchitis (HCC) Stable. Continue trelegy once daily. Continue ventolin prn.  3. Seasonal allergic rhinitis due to pollen Start on zyrtec.  Follow up: as needed.  [name], MD

## 2019-07-01 ENCOUNTER — Telehealth: Payer: Self-pay

## 2019-07-01 NOTE — Telephone Encounter (Signed)
      I attempted to contact patient today to follow-up after visit to [facility] ED on 06/26/19 with complaints of LLQ abdominal pain that has been present for two weeks.  Per report patient felt better after fleet enema.  She was discharged with stool softeners, Flagyl and Levaquin for Dx of Stercoral Colitis.  Patient did not answer and I was unable to leave a voice mail.   Erie Noe, LPN [id number] PM

## 2019-07-02 ENCOUNTER — Other Ambulatory Visit: Payer: Self-pay

## 2019-07-02 NOTE — Telephone Encounter (Signed)
Anne [name] called and wants to pick up a prescription for hydrocodone tomorrow.  She also wanted to let Dr. Tobie Poet know that she had a very stressful day on Monday and then she had a syncopal episode Monday evening.  Dr. Tobie Poet advised that she follow-up with [facility].

## 2019-07-03 ENCOUNTER — Other Ambulatory Visit: Payer: Self-pay | Admitting: Family Medicine

## 2019-07-03 ENCOUNTER — Other Ambulatory Visit: Payer: Self-pay

## 2019-07-03 MED ORDER — HYDROCODONE-ACETAMINOPHEN 5-325 MG PO TABS: 1.0000 | ORAL_TABLET | Freq: Two times a day (BID) | ORAL | 0 refills | Status: DC | PRN

## 2019-07-03 MED ORDER — HYDROCODONE-ACETAMINOPHEN 5-325MG PREPACK (~~LOC~~: ORAL_TABLET | ORAL | 0 refills | Status: DC

## 2019-07-04 ENCOUNTER — Encounter: Payer: Self-pay | Admitting: Family Medicine

## 2019-07-04 DIAGNOSIS — J301 Allergic rhinitis due to pollen: Secondary | ICD-10-CM | POA: Insufficient documentation

## 2019-07-04 DIAGNOSIS — R1032 Left lower quadrant pain: Secondary | ICD-10-CM | POA: Insufficient documentation

## 2019-07-04 DIAGNOSIS — J441 Chronic obstructive pulmonary disease with (acute) exacerbation: Secondary | ICD-10-CM | POA: Insufficient documentation

## 2019-07-04 DIAGNOSIS — J410 Simple chronic bronchitis: Secondary | ICD-10-CM | POA: Insufficient documentation

## 2019-07-04 DIAGNOSIS — R1030 Lower abdominal pain, unspecified: Secondary | ICD-10-CM | POA: Insufficient documentation

## 2019-07-04 HISTORY — DX: Simple chronic bronchitis: J41.0

## 2019-07-07 ENCOUNTER — Telehealth: Payer: Self-pay

## 2019-07-07 NOTE — Telephone Encounter (Signed)
PA for Hydrocodone-Acetominophen 5-325 submitted and approved via [email or web address]

## 2019-07-12 ENCOUNTER — Other Ambulatory Visit: Payer: Self-pay | Admitting: Family Medicine

## 2019-07-21 ENCOUNTER — Other Ambulatory Visit: Payer: Self-pay

## 2019-07-21 MED ORDER — MIRTAZAPINE 45 MG PO TABS: 45.0000 mg | ORAL_TABLET | Freq: Every day | ORAL | 0 refills | Status: DC

## 2019-07-21 MED ORDER — FENOFIBRATE 160 MG PO TABS: 160.0000 mg | ORAL_TABLET | Freq: Every day | ORAL | 0 refills | Status: DC

## 2019-07-21 MED ORDER — GABAPENTIN 300 MG PO CAPS: 300.0000 mg | ORAL_CAPSULE | Freq: Three times a day (TID) | ORAL | 0 refills | Status: DC

## 2019-07-21 MED ORDER — TEMAZEPAM 15 MG PO CAPS: ORAL_CAPSULE | ORAL | 0 refills | Status: DC

## 2019-07-21 MED ORDER — SUMATRIPTAN SUCCINATE 100 MG PO TABS: 100.0000 mg | ORAL_TABLET | Freq: Every day | ORAL | 2 refills | Status: DC | PRN

## 2019-07-22 ENCOUNTER — Other Ambulatory Visit: Payer: Self-pay

## 2019-07-23 MED ORDER — VENLAFAXINE HCL ER 75 MG PO CP24: 225.0000 mg | ORAL_CAPSULE | Freq: Every day | ORAL | 0 refills | Status: DC

## 2019-07-23 MED ORDER — AMITRIPTYLINE HCL 75 MG PO TABS: 75.0000 mg | ORAL_TABLET | Freq: Every day | ORAL | 0 refills | Status: DC

## 2019-07-23 MED ORDER — TRAZODONE HCL 100 MG PO TABS: 100.0000 mg | ORAL_TABLET | Freq: Every evening | ORAL | 0 refills | Status: DC | PRN

## 2019-07-23 MED ORDER — ATORVASTATIN CALCIUM 20 MG PO TABS: 20.0000 mg | ORAL_TABLET | Freq: Every day | ORAL | 0 refills | Status: DC

## 2019-07-25 ENCOUNTER — Other Ambulatory Visit: Payer: Self-pay | Admitting: Family Medicine

## 2019-07-28 ENCOUNTER — Telehealth: Payer: Self-pay

## 2019-07-28 NOTE — Telephone Encounter (Signed)
Anne [name] called to request medication for tremors. She has taken medication in the past and does remember the name.  According to [facility] the tremors seem to be worse.  What do you recommend.

## 2019-07-28 NOTE — Telephone Encounter (Signed)
Primidone 50 mg one twice a day.

## 2019-07-30 ENCOUNTER — Other Ambulatory Visit: Payer: Self-pay

## 2019-07-30 MED ORDER — PRIMIDONE 50 MG PO TABS: 50.0000 mg | ORAL_TABLET | Freq: Two times a day (BID) | ORAL | 0 refills | Status: DC

## 2019-07-30 MED ORDER — ALBUTEROL SULFATE HFA 108 (90 BASE) MCG/ACT IN AERS: 1.0000 | INHALATION_SPRAY | Freq: Four times a day (QID) | RESPIRATORY_TRACT | 0 refills | Status: DC | PRN

## 2019-07-30 NOTE — Telephone Encounter (Signed)
Patient notified and RX sent to pharmacy.

## 2019-08-04 ENCOUNTER — Other Ambulatory Visit: Payer: Self-pay

## 2019-08-04 MED ORDER — AMITRIPTYLINE HCL 75 MG PO TABS: 75.0000 mg | ORAL_TABLET | Freq: Every day | ORAL | 0 refills | Status: DC

## 2019-08-04 MED ORDER — VENLAFAXINE HCL ER 75 MG PO CP24: 225.0000 mg | ORAL_CAPSULE | Freq: Every day | ORAL | 0 refills | Status: DC

## 2019-08-04 MED ORDER — TRAZODONE HCL 100 MG PO TABS: 100.0000 mg | ORAL_TABLET | Freq: Every evening | ORAL | 0 refills | Status: DC | PRN

## 2019-08-04 MED ORDER — ATORVASTATIN CALCIUM 20 MG PO TABS: 20.0000 mg | ORAL_TABLET | Freq: Every day | ORAL | 0 refills | Status: DC

## 2019-08-04 MED ORDER — ALBUTEROL SULFATE HFA 108 (90 BASE) MCG/ACT IN AERS: 1.0000 | INHALATION_SPRAY | Freq: Four times a day (QID) | RESPIRATORY_TRACT | 0 refills | Status: DC | PRN

## 2019-08-04 MED ORDER — TRAMADOL HCL 50 MG PO TABS: 50.0000 mg | ORAL_TABLET | Freq: Two times a day (BID) | ORAL | 0 refills | Status: DC

## 2019-08-19 ENCOUNTER — Other Ambulatory Visit: Payer: Self-pay | Admitting: Family Medicine

## 2019-08-19 ENCOUNTER — Other Ambulatory Visit: Payer: Self-pay | Admitting: Physician Assistant

## 2019-08-23 ENCOUNTER — Other Ambulatory Visit: Payer: Self-pay | Admitting: Physician Assistant

## 2019-08-23 ENCOUNTER — Other Ambulatory Visit: Payer: Self-pay | Admitting: Family Medicine

## 2019-08-28 ENCOUNTER — Other Ambulatory Visit: Payer: Self-pay

## 2019-08-28 ENCOUNTER — Ambulatory Visit (INDEPENDENT_AMBULATORY_CARE_PROVIDER_SITE_OTHER): Payer: BC Managed Care – PPO | Admitting: Physician Assistant

## 2019-08-28 ENCOUNTER — Encounter: Payer: Self-pay | Admitting: Physician Assistant

## 2019-08-28 VITALS — BP 96/68 | HR 66 | Temp 97.3°F | Ht 61.0 in | Wt 83.0 lb

## 2019-08-28 DIAGNOSIS — R634 Abnormal weight loss: Secondary | ICD-10-CM

## 2019-08-28 DIAGNOSIS — R194 Change in bowel habit: Secondary | ICD-10-CM | POA: Diagnosis not present

## 2019-08-28 HISTORY — DX: Abnormal weight loss: R63.4

## 2019-08-28 MED ORDER — HYDROCODONE-ACETAMINOPHEN 5-325 MG PO TABS: 1.0000 | ORAL_TABLET | Freq: Two times a day (BID) | ORAL | 0 refills | Status: DC | PRN

## 2019-08-28 MED ORDER — TIZANIDINE HCL 4 MG PO TABS: ORAL_TABLET | ORAL | 0 refills | Status: DC

## 2019-08-29 LAB — CBC WITH DIFFERENTIAL/PLATELET
Basophils Absolute: 0.1 10*3/uL (ref 0.0–0.2)
Basos: 1 %
EOS (ABSOLUTE): 0.1 10*3/uL (ref 0.0–0.4)
Eos: 1 %
Hematocrit: 39.5 % (ref 34.0–46.6)
Hemoglobin: 12.5 g/dL (ref 11.1–15.9)
Immature Grans (Abs): 0 10*3/uL (ref 0.0–0.1)
Immature Granulocytes: 0 %
Lymphocytes Absolute: 1.3 10*3/uL (ref 0.7–3.1)
Lymphs: 19 %
MCH: 29.7 pg (ref 26.6–33.0)
MCHC: 31.6 g/dL (ref 31.5–35.7)
MCV: 94 fL (ref 79–97)
Monocytes Absolute: 0.6 10*3/uL (ref 0.1–0.9)
Monocytes: 9 %
Neutrophils Absolute: 4.9 10*3/uL (ref 1.4–7.0)
Neutrophils: 70 %
Platelets: 219 10*3/uL (ref 150–450)
RBC: 4.21 x10E6/uL (ref 3.77–5.28)
RDW: 12.3 % (ref 11.7–15.4)
WBC: 7 10*3/uL (ref 3.4–10.8)

## 2019-08-29 LAB — COMPREHENSIVE METABOLIC PANEL
ALT: 15 IU/L (ref 0–32)
AST: 19 IU/L (ref 0–40)
Albumin/Globulin Ratio: 1.5 (ref 1.2–2.2)
Albumin: 3.6 g/dL — ABNORMAL LOW (ref 3.7–4.7)
Alkaline Phosphatase: 79 IU/L (ref 48–121)
BUN/Creatinine Ratio: 18 (ref 12–28)
BUN: 13 mg/dL (ref 8–27)
Bilirubin Total: 0.3 mg/dL (ref 0.0–1.2)
CO2: 26 mmol/L (ref 20–29)
Calcium: 8.9 mg/dL (ref 8.7–10.3)
Chloride: 103 mmol/L (ref 96–106)
Creatinine, Ser: 0.71 mg/dL (ref 0.57–1.00)
GFR calc Af Amer: 98 mL/min/{1.73_m2} (ref >59)
GFR calc non Af Amer: 85 mL/min/{1.73_m2} (ref >59)
Globulin, Total: 2.4 g/dL (ref 1.5–4.5)
Glucose: 114 mg/dL — ABNORMAL HIGH (ref 65–99)
Potassium: 3.7 mmol/L (ref 3.5–5.2)
Sodium: 139 mmol/L (ref 134–144)
Total Protein: 6 g/dL (ref 6.0–8.5)

## 2019-08-29 LAB — TSH: TSH: 0.672 u[IU]/mL (ref 0.450–4.500)

## 2019-08-31 ENCOUNTER — Other Ambulatory Visit: Payer: Self-pay

## 2019-08-31 ENCOUNTER — Other Ambulatory Visit: Payer: Self-pay | Admitting: Physician Assistant

## 2019-08-31 DIAGNOSIS — R1084 Generalized abdominal pain: Secondary | ICD-10-CM

## 2019-08-31 DIAGNOSIS — R634 Abnormal weight loss: Secondary | ICD-10-CM

## 2019-08-31 NOTE — Progress Notes (Signed)
re

## 2019-08-31 NOTE — Assessment & Plan Note (Signed)
CT abdomen/pelvis and referral to GI

## 2019-08-31 NOTE — Progress Notes (Signed)
Acute Office Visit  Subjective:    Patient ID: Anne [name], female    DOB: 02/22/1946, 74 y.o.   MRN: [id number]  CC Weight loss  HPI Patient is in today for weight loss Pt states that she has not had appetite much over the past month and is losing weight - of note she has lost 11 pounds since last visit on 06/26/19 She states that gradually her appetite has diminished -   She states she has not had chest pain, dyspnea but has had mild abdominal discomfort with changes in bms over the past month  Past Medical History:  Diagnosis Date   Asthma    daily inhalers   Breast cancer ([place]) 05/2015   left   COPD (chronic obstructive pulmonary disease) ([place])    denies SOB with ADLs; no O2   Dental crowns present    Depression    History of concussion 01/01/2014   History of kidney stones    History of TIA (transient ischemic attack) 10/2014   Hyperlipidemia    Insomnia    Migraine    Nasal congestion 05/26/2015   will finish z-pak 05/27/2015   Nonproductive cough 05/26/2015   PONV (postoperative nausea and vomiting)    "long time ago"  none recently   RLS (restless legs syndrome)    Tobacco abuse    Vitamin D deficiency    Wears dentures    lower   Wears partial dentures    upper    Past Surgical History:  Procedure Laterality Date   ABDOMINAL HYSTERECTOMY  2000   complete   APPENDECTOMY  2000   BREAST BIOPSY Left    BREAST RECONSTRUCTION WITH PLACEMENT OF TISSUE EXPANDER AND FLEX HD (ACELLULAR HYDRATED DERMIS) Bilateral 06/02/2015   Procedure: BILATERAL BREAST RECONSTRUCTION WITH PLACEMENT OF TISSUE EXPANDER AND  ACELLULAR HYDRATED DERMIS;  Surgeon: Irene Limbo, MD;  Location: [place];  Service: Plastics;  Laterality: Bilateral;   CARPAL TUNNEL RELEASE Bilateral    CATARACT EXTRACTION Bilateral 09/2013   KNEE ARTHROSCOPY Left    LOOP RECORDER INSERTION N/A 05/11/2016   Procedure: Loop Recorder Insertion;  Surgeon: Will  Meredith Leeds, MD;  Location: [place] [facility];  Service: Cardiovascular;  Laterality: N/A;   MASTECTOMY W/ SENTINEL NODE BIOPSY Bilateral 06/02/2015   Procedure: BILATERAL MASTECTOMY WITH LEFT SENTINEL LYMPH NODE BIOPSY;  Surgeon: Stark Klein, MD;  Location: [place];  Service: General;  Laterality: Bilateral;   NASAL SEPTUM SURGERY     x 3   PLANTAR FASCIA SURGERY Left    REMOVAL OF BILATERAL TISSUE EXPANDERS WITH PLACEMENT OF BILATERAL BREAST IMPLANTS Bilateral 06/17/2015   Procedure: DEBRIDEMENT OF BILATERAL MASTECTOMY FLAP WITH BILATERAL TISSUE EXPANDER EXCHANGE ;  Surgeon: Irene Limbo, MD;  Location: [place];  Service: Plastics;  Laterality: Bilateral;   REMOVAL OF TISSUE EXPANDER Bilateral 07/12/2015   Procedure: REMOVAL OF BILATERAL TISSUE EXPANDERS;  Surgeon: Irene Limbo, MD;  Location: [place];  Service: Plastics;  Laterality: Bilateral;    Family History  Problem Relation Age of Onset   Cervical cancer Mother 3   Colon cancer Maternal Grandfather        mets to stomach; dx. 67-70   Heart disease Father    Prostate cancer Father 55   Cervical cancer Maternal Grandmother        dx. 46s; treated with radium implant   Lung cancer Sister 60       maternal half-sister dx. lung  cancer, stage III   Breast cancer Maternal Aunt    Cervical cancer Sister 75       paternal half-sister; s/p TAH   Spina bifida Grandchild    Brain cancer Grandchild        grandson dx. at 20 mos, treated at [place] [facility] Other 82       maternal half-sister's daughter   Cancer Maternal Aunt        d. 79; unspecified type - "began at back area and moved to vital organs"   Cancer Maternal Uncle         late 23s; unspecified type; "started at back and moved to vital organs"    Social History   Socioeconomic History   Marital status: Widowed    Spouse name: Not on file   Number of children: 1   Years of education: 7   Highest  education level: Not on file  Occupational History    Comment: [employer], retired  Tobacco Use   Smoking status: Light Tobacco Smoker    Packs/day: 0.25    Types: Cigarettes    Last attempt to quit: 05/04/2015    Years since quitting: 4.3   Smokeless tobacco: Never Used  Substance and Sexual Activity   Alcohol use: Yes    Alcohol/week: 0.0 standard drinks    Comment: rarely   Drug use: No   Sexual activity: Not on file  Other Topics Concern   Not on file  Social History Narrative   Lives alone, widow   Caffeine use- tea, 1 cup daily   Social Determinants of Health   Financial Resource Strain:    Difficulty of Paying Living Expenses:   Food Insecurity:    Worried About Charity fundraiser in the Last Year:    Arboriculturist in the Last Year:   Transportation Needs:    Film/video editor (Medical):    Lack of Transportation (Non-Medical):   Physical Activity:    Days of Exercise per Week:    Minutes of Exercise per Session:   Stress:    Feeling of Stress :   Social Connections:    Frequency of Communication with Friends and Family:    Frequency of Social Gatherings with Friends and Family:    Attends Religious Services:    Active Member of Clubs or Organizations:    Attends Music therapist:    Marital Status:   Intimate Partner Violence:    Fear of Current or Ex-Partner:    Emotionally Abused:    Physically Abused:    Sexually Abused:      Current Outpatient Medications:    albuterol (VENTOLIN HFA) 108 (90 Base) MCG/ACT inhaler, Inhale 1-2 puffs into the lungs every 6 (six) hours as needed for wheezing or shortness of breath., Disp: 18 g, Rfl: 0   amitriptyline (ELAVIL) 75 MG tablet, Take 1 tablet by mouth once daily, Disp: 90 tablet, Rfl: 1   atorvastatin (LIPITOR) 20 MG tablet, Take 1 tablet (20 mg total) by mouth daily., Disp: 90 tablet, Rfl: 0   cetirizine (ZYRTEC) 10 MG tablet, Take 1 tablet (10 mg  total) by mouth daily., Disp: 30 tablet, Rfl: 11   Cholecalciferol (VITAMIN D3) 50000 UNITS CAPS, Take 50,000 Units by mouth once a week. , Disp: , Rfl:    clotrimazole (MYCELEX) 10 MG troche, Take 1 tablet (10 mg total) by mouth 5 (five) times daily., Disp: 50 Troche, Rfl: 0  fenofibrate 160 MG tablet, Take 1 tablet (160 mg total) by mouth daily., Disp: 90 tablet, Rfl: 0   fluorometholone (FML) 0.1 % ophthalmic suspension, , Disp: , Rfl:    Fluticasone-Umeclidin-Vilant 100-62.5-25 MCG/INH AEPB, Inhale into the lungs., Disp: , Rfl:    furosemide (LASIX) 20 MG tablet, Take 20 mg by mouth daily as needed for edema (for leg swelling). , Disp: , Rfl:    gabapentin (NEURONTIN) 300 MG capsule, TAKE 1 CAPSULE BY MOUTH THREE TIMES DAILY, Disp: 90 capsule, Rfl: 3   HYDROcodone-acetaminophen (NORCO/VICODIN) 5-325 MG tablet, Take 1 tablet by mouth 2 (two) times daily as needed for severe pain., Disp: 60 tablet, Rfl: 0   methocarbamol (ROBAXIN) 500 MG tablet, Take 500 mg by mouth., Disp: , Rfl:    mirtazapine (REMERON) 45 MG tablet, TAKE 1 TABLET BY MOUTH AT BEDTIME, Disp: 30 tablet, Rfl: 2   potassium chloride SA (KLOR-CON) 20 MEQ tablet, TAKE 1 TABLET BY MOUTH ONCE DAILY WITH FOOD, Disp: , Rfl:    primidone (MYSOLINE) 50 MG tablet, Take 1 tablet (50 mg total) by mouth in the morning and at bedtime., Disp: 60 tablet, Rfl: 0   promethazine (PHENERGAN) 25 MG tablet, Take 1 tablet by mouth as needed for nausea/vomiting., Disp: , Rfl:    RESTASIS 0.05 % ophthalmic emulsion, , Disp: , Rfl:    SUMAtriptan (IMITREX) 100 MG tablet, Take 1 tablet (100 mg total) by mouth daily as needed for migraine (100 mg and then may repeat in 2 hours if headache persists)., Disp: 10 tablet, Rfl: 2   tamoxifen (NOLVADEX) 10 MG tablet, , Disp: , Rfl:    temazepam (RESTORIL) 15 MG capsule, TAKE 1 CAPSULE BY MOUTH ONCE DAILY AT BEDTIME AS NEEDED, Disp: 90 capsule, Rfl: 0   tiZANidine (ZANAFLEX) 4 MG tablet, TAKE 1  TABLET BY MOUTH EVERY 8 HOURS AS NEEDED NOT  TO  EXCEED  3  DOSES  IN  24  HOURS, Disp: 120 tablet, Rfl: 0   traMADol (ULTRAM) 50 MG tablet, Take 1 tablet (50 mg total) by mouth 2 (two) times daily., Disp: 180 tablet, Rfl: 0   traZODone (DESYREL) 100 MG tablet, Take 1 tablet (100 mg total) by mouth at bedtime as needed for sleep., Disp: 90 tablet, Rfl: 0   venlafaxine XR (EFFEXOR-XR) 75 MG 24 hr capsule, Take 3 capsules (225 mg total) by mouth daily., Disp: 270 capsule, Rfl: 0   Vitamin D, Ergocalciferol, (DRISDOL) 1.25 MG (50000 UNIT) CAPS capsule, , Disp: , Rfl:   Current Facility-Administered Medications:    ipratropium-albuterol (DUONEB) 0.5-2.5 (3) MG/3ML nebulizer solution 3 mL, 3 mL, Nebulization, Once, Cox, Kirsten, MD   Allergies  Allergen Reactions   Rocephin [Ceftriaxone Sodium In Dextrose] Shortness Of Breath and Rash   Clindamycin/Lincomycin Nausea And Vomiting   Lyrica [Pregabalin] Other (See Comments)    PERSONALITY CHANGE    CONSTITUTIONAL: see HPI E/N/T: Negative for ear pain, nasal congestion and sore throat.  CARDIOVASCULAR: Negative for chest pain, dizziness, palpitations and pedal edema.  RESPIRATORY: Negative for recent cough and dyspnea.  GASTROINTESTINAL: see HPI MSK: Negative for arthralgias and myalgias.  INTEGUMENTARY: Negative for rash.  NEUROLOGICAL: Negative for dizziness and headaches.  PSYCHIATRIC: Negative for sleep disturbance and to question depression screen.  Negative for depression, negative for anhedonia.         Objective:    PHYSICAL EXAM:   VS: BP 96/68 (BP Location: Right Arm, Patient Position: Sitting)    Pulse 66  Temp (!) 97.3 F (36.3 C) (Temporal)    Ht 5\' 1"  (1.549 m)    Wt 83 lb (37.6 kg)    SpO2 97%    BMI 15.68 kg/m   GEN: Well nourished, well developed, in no acute distress   Cardiac: RRR; no murmurs, rubs, or gallops,no edema - no significant varicosities Respiratory:  normal respiratory rate and pattern with no  distress - normal breath sounds with no rales, rhonchi, wheezes or rubs GI: normal bowel sounds, no masses or tenderness  Skin: warm and dry, no rash  Neuro:  Alert and Oriented x 3, Strength and sensation are intact - CN II-Xii grossly intact Psych: euthymic mood, appropriate affect and demeanor   Wt Readings from Last 3 Encounters:  08/28/19 83 lb (37.6 kg)  06/26/19 94 lb 9.6 oz (42.9 kg)  06/09/19 97 lb (44 kg)    Health Maintenance Due  Topic Date Due   Hepatitis C Screening  Never done   COVID-19 Vaccine (1) Never done   MAMMOGRAM  Never done   DEXA SCAN  Never done    There are no preventive care reminders to display for this patient.        Assessment & Plan:   Problem List Items Addressed This Visit      Other   Weight loss - Primary    Weight recheck 2 weeks then follow up one month with Dr Tobie Poet Ensure qd - bid Referral to GI      Relevant Orders   CBC with Differential/Platelet (Completed)   Comprehensive metabolic panel (Completed)   TSH (Completed)   Bowel habit changes    CT abdomen/pelvis and referral to GI          Meds ordered this encounter  Medications   tiZANidine (ZANAFLEX) 4 MG tablet    Sig: TAKE 1 TABLET BY MOUTH EVERY 8 HOURS AS NEEDED NOT  TO  EXCEED  3  DOSES  IN  24  HOURS    Dispense:  120 tablet    Refill:  0    Order Specific Question:   Supervising Provider    Answer:   Shelton Silvas   HYDROcodone-acetaminophen (NORCO/VICODIN) 5-325 MG tablet    Sig: Take 1 tablet by mouth 2 (two) times daily as needed for severe pain.    Dispense:  60 tablet    Refill:  0    Order Specific Question:   Supervising Provider    Answer:   COX, KIRSTEN [[id number]]     [place], PA-C

## 2019-08-31 NOTE — Assessment & Plan Note (Signed)
Weight recheck 2 weeks then follow up one month with Dr Tobie Poet Ensure qd - bid Referral to GI

## 2019-09-09 ENCOUNTER — Other Ambulatory Visit: Payer: Self-pay

## 2019-09-09 DIAGNOSIS — R634 Abnormal weight loss: Secondary | ICD-10-CM

## 2019-09-09 DIAGNOSIS — R1084 Generalized abdominal pain: Secondary | ICD-10-CM

## 2019-09-11 ENCOUNTER — Other Ambulatory Visit: Payer: Self-pay

## 2019-09-11 ENCOUNTER — Ambulatory Visit: Payer: Medicare Other

## 2019-09-11 VITALS — Wt 92.4 lb

## 2019-09-11 DIAGNOSIS — R634 Abnormal weight loss: Secondary | ICD-10-CM

## 2019-09-11 NOTE — Progress Notes (Signed)
Anne [name] comes in for recheck of her weight. She has had a weight gain of 9.4 lbs.  Since her last visit she was seen at [facility] after a tick bite 1 week ago.  She was treated with doxycycline and prednisone.  She was questioned about her GI referral.  She has not called to scheduled as of yet.  She was encouraged to call ASAP to schedule the GI appointment.

## 2019-09-24 ENCOUNTER — Telehealth: Payer: Self-pay

## 2019-09-24 NOTE — Telephone Encounter (Signed)
Anne [name] called with complaints of abdominal pain.  On a scale of 1-10, she rated her pain a 16.  She also has been passing bright red blood in her stool.  She was told to go immediately to the ED to evaluate since bloodwork and a possible scan might be indicated.

## 2019-09-25 ENCOUNTER — Encounter: Payer: Self-pay | Admitting: Family Medicine

## 2019-09-25 ENCOUNTER — Other Ambulatory Visit: Payer: Self-pay

## 2019-09-25 ENCOUNTER — Ambulatory Visit (INDEPENDENT_AMBULATORY_CARE_PROVIDER_SITE_OTHER): Payer: BC Managed Care – PPO | Admitting: Family Medicine

## 2019-09-25 VITALS — BP 132/68 | HR 77 | Temp 97.6°F | Ht 61.0 in | Wt 89.2 lb

## 2019-09-25 DIAGNOSIS — K5904 Chronic idiopathic constipation: Secondary | ICD-10-CM

## 2019-09-25 DIAGNOSIS — R1030 Lower abdominal pain, unspecified: Secondary | ICD-10-CM

## 2019-09-25 LAB — POCT URINALYSIS DIP (CLINITEK)
Bilirubin, UA: NEGATIVE
Blood, UA: NEGATIVE
Glucose, UA: NEGATIVE mg/dL
Ketones, POC UA: NEGATIVE mg/dL
Leukocytes, UA: NEGATIVE
Nitrite, UA: NEGATIVE
Spec Grav, UA: 1.02 (ref 1.010–1.025)
Urobilinogen, UA: 0.2 E.U./dL
pH, UA: 6 (ref 5.0–8.0)

## 2019-09-25 NOTE — Progress Notes (Signed)
Acute Office Visit  Subjective:    Patient ID: Anne [name], female    DOB: 02-20-1946, 74 y.o.   MRN: [id number]  Chief Complaint  Patient presents with  . Abdominal Pain    LLQ    HPI Patient is in today for LLQ Pain, was seen at [facility] and left after having blood work, she was not seen by a doctor. Left after having to wait several hours. Abdominal pain started yesterday however pain comes and goes. States she seen blood in her stools yesterday and 3-4 times a week had hard and painful stools. Stomach is tender. Patient has persistent constipation. Has bms 3 times a week. CBC mild anemia. HB 10.5. CMP normal except sodium and potassium slightly low. UA essentially normal. Pt has history of kidney stones. CT of abdomen and pelvis done one month ago which showed constipation, but no diverticulosis. She was supposed to go to Dr. Melina Copa, but could not get time off work. Patient has several medicines which contribute to constipation. Patient is on amitriptyline, tramadol (qd), vicodin 3 times a week, methocarbamol bid and tizanidine once daily. She is not supposed to be on both muscle relaxants.  Past Medical History:  Diagnosis Date  . Asthma    daily inhalers  . Breast cancer ([place]) 05/2015   left  . COPD (chronic obstructive pulmonary disease) ([place])    denies SOB with ADLs; no O2  . Dental crowns present   . Depression   . History of concussion 01/01/2014  . History of kidney stones   . History of TIA (transient ischemic attack) 10/2014  . Hyperlipidemia   . Insomnia   . Migraine   . Nasal congestion 05/26/2015   will finish z-pak 05/27/2015  . Nonproductive cough 05/26/2015  . PONV (postoperative nausea and vomiting)    "long time ago"  none recently  . RLS (restless legs syndrome)   . Tobacco abuse   . Vitamin D deficiency   . Wears dentures    lower  . Wears partial dentures    upper    Past Surgical History:  Procedure Laterality Date  . ABDOMINAL HYSTERECTOMY  2000    complete  . APPENDECTOMY  2000  . BREAST BIOPSY Left   . BREAST RECONSTRUCTION WITH PLACEMENT OF TISSUE EXPANDER AND FLEX HD (ACELLULAR HYDRATED DERMIS) Bilateral 06/02/2015   Procedure: BILATERAL BREAST RECONSTRUCTION WITH PLACEMENT OF TISSUE EXPANDER AND  ACELLULAR HYDRATED DERMIS;  Surgeon: Irene Limbo, MD;  Location: [place];  Service: Plastics;  Laterality: Bilateral;  . CARPAL TUNNEL RELEASE Bilateral   . CATARACT EXTRACTION Bilateral 09/2013  . KNEE ARTHROSCOPY Left   . LOOP RECORDER INSERTION N/A 05/11/2016   Procedure: Loop Recorder Insertion;  Surgeon: Will Meredith Leeds, MD;  Location: [facility];  Service: Cardiovascular;  Laterality: N/A;  . MASTECTOMY W/ SENTINEL NODE BIOPSY Bilateral 06/02/2015   Procedure: BILATERAL MASTECTOMY WITH LEFT SENTINEL LYMPH NODE BIOPSY;  Surgeon: Stark Klein, MD;  Location: [place];  Service: General;  Laterality: Bilateral;  . NASAL SEPTUM SURGERY     x 3  . PLANTAR FASCIA SURGERY Left   . REMOVAL OF BILATERAL TISSUE EXPANDERS WITH PLACEMENT OF BILATERAL BREAST IMPLANTS Bilateral 06/17/2015   Procedure: DEBRIDEMENT OF BILATERAL MASTECTOMY FLAP WITH BILATERAL TISSUE EXPANDER EXCHANGE ;  Surgeon: Irene Limbo, MD;  Location: [place];  Service: Plastics;  Laterality: Bilateral;  . REMOVAL OF TISSUE EXPANDER Bilateral 07/12/2015   Procedure: REMOVAL OF BILATERAL TISSUE  EXPANDERS;  Surgeon: Irene Limbo, MD;  Location: [place];  Service: Plastics;  Laterality: Bilateral;    Family History  Problem Relation Age of Onset  . Cervical cancer Mother 25  . Colon cancer Maternal Grandfather        mets to stomach; dx. 67-70  . Heart disease Father   . Prostate cancer Father 18  . Cervical cancer Maternal Grandmother        dx. 79s; treated with radium implant  . Lung cancer Sister 60       maternal half-sister dx. lung cancer, stage III  . Breast cancer Maternal Aunt   . Cervical  cancer Sister 22       paternal half-sister; s/p TAH  . Spina bifida Grandchild   . Brain cancer Grandchild        grandson dx. at 20 mos, treated at [facility]  . Breast cancer Other 77       maternal half-sister's daughter  . Cancer Maternal Aunt        d. 64; unspecified type - "began at back area and moved to vital organs"  . Cancer Maternal Uncle         late 42s; unspecified type; "started at back and moved to vital organs"    Social History   Socioeconomic History  . Marital status: Widowed    Spouse name: Not on file  . Number of children: 1  . Years of education: 65  . Highest education level: Not on file  Occupational History    Comment: [employer], retired  Tobacco Use  . Smoking status: Light Tobacco Smoker    Packs/day: 0.25    Types: Cigarettes    Last attempt to quit: 05/04/2015    Years since quitting: 4.3  . Smokeless tobacco: Never Used  Substance and Sexual Activity  . Alcohol use: Yes    Alcohol/week: 0.0 standard drinks    Comment: rarely  . Drug use: No  . Sexual activity: Not on file  Other Topics Concern  . Not on file  Social History Narrative   Lives alone, widow   Caffeine use- tea, 1 cup daily   Social Determinants of Health   Financial Resource Strain:   . Difficulty of Paying Living Expenses:   Food Insecurity:   . Worried About Charity fundraiser in the Last Year:   . Arboriculturist in the Last Year:   Transportation Needs:   . Film/video editor (Medical):   Marland Kitchen Lack of Transportation (Non-Medical):   Physical Activity:   . Days of Exercise per Week:   . Minutes of Exercise per Session:   Stress:   . Feeling of Stress :   Social Connections:   . Frequency of Communication with Friends and Family:   . Frequency of Social Gatherings with Friends and Family:   . Attends Religious Services:   . Active Member of Clubs or Organizations:   . Attends Archivist Meetings:   Marland Kitchen Marital Status:   Intimate Partner  Violence:   . Fear of Current or Ex-Partner:   . Emotionally Abused:   Marland Kitchen Physically Abused:   . Sexually Abused:     Outpatient Medications Prior to Visit  Medication Sig Dispense Refill  . tiZANidine (ZANAFLEX) 4 MG tablet TAKE 1 TABLET BY MOUTH EVERY 8 HOURS AS NEEDED NOT  TO  EXCEED  3  DOSES  IN  24  HOURS 120 tablet 0  .  albuterol (VENTOLIN HFA) 108 (90 Base) MCG/ACT inhaler Inhale 1-2 puffs into the lungs every 6 (six) hours as needed for wheezing or shortness of breath. 18 g 0  . amitriptyline (ELAVIL) 75 MG tablet Take 1 tablet by mouth once daily 90 tablet 1  . atorvastatin (LIPITOR) 20 MG tablet Take 1 tablet (20 mg total) by mouth daily. 90 tablet 0  . cetirizine (ZYRTEC) 10 MG tablet Take 1 tablet (10 mg total) by mouth daily. 30 tablet 11  . Cholecalciferol (VITAMIN D3) 50000 UNITS CAPS Take 50,000 Units by mouth once a week.     . clotrimazole (MYCELEX) 10 MG troche Take 1 tablet (10 mg total) by mouth 5 (five) times daily. 50 Troche 0  . fenofibrate 160 MG tablet Take 1 tablet (160 mg total) by mouth daily. 90 tablet 0  . fluorometholone (FML) 0.1 % ophthalmic suspension     . Fluticasone-Umeclidin-Vilant 100-62.5-25 MCG/INH AEPB Inhale into the lungs.    . furosemide (LASIX) 20 MG tablet Take 20 mg by mouth daily as needed for edema (for leg swelling).     . gabapentin (NEURONTIN) 300 MG capsule TAKE 1 CAPSULE BY MOUTH THREE TIMES DAILY 90 capsule 3  . HYDROcodone-acetaminophen (NORCO/VICODIN) 5-325 MG tablet Take 1 tablet by mouth 2 (two) times daily as needed for severe pain. 60 tablet 0  . methocarbamol (ROBAXIN) 500 MG tablet Take 500 mg by mouth.    . mirtazapine (REMERON) 45 MG tablet TAKE 1 TABLET BY MOUTH AT BEDTIME 30 tablet 2  . potassium chloride SA (KLOR-CON) 20 MEQ tablet TAKE 1 TABLET BY MOUTH ONCE DAILY WITH FOOD    . primidone (MYSOLINE) 50 MG tablet Take 1 tablet (50 mg total) by mouth in the morning and at bedtime. 60 tablet 0  . promethazine (PHENERGAN) 25  MG tablet Take 1 tablet by mouth as needed for nausea/vomiting.    . RESTASIS 0.05 % ophthalmic emulsion     . SUMAtriptan (IMITREX) 100 MG tablet Take 1 tablet (100 mg total) by mouth daily as needed for migraine (100 mg and then may repeat in 2 hours if headache persists). 10 tablet 2  . tamoxifen (NOLVADEX) 10 MG tablet     . temazepam (RESTORIL) 15 MG capsule TAKE 1 CAPSULE BY MOUTH ONCE DAILY AT BEDTIME AS NEEDED 90 capsule 0  . traMADol (ULTRAM) 50 MG tablet Take 1 tablet (50 mg total) by mouth 2 (two) times daily. 180 tablet 0  . traZODone (DESYREL) 100 MG tablet Take 1 tablet (100 mg total) by mouth at bedtime as needed for sleep. 90 tablet 0  . venlafaxine XR (EFFEXOR-XR) 75 MG 24 hr capsule Take 3 capsules (225 mg total) by mouth daily. 270 capsule 0  . Vitamin D, Ergocalciferol, (DRISDOL) 1.25 MG (50000 UNIT) CAPS capsule      Facility-Administered Medications Prior to Visit  Medication Dose Route Frequency Provider Last Rate Last Admin  . ipratropium-albuterol (DUONEB) 0.5-2.5 (3) MG/3ML nebulizer solution 3 mL  3 mL Nebulization Once [name], MD        Allergies  Allergen Reactions  . Rocephin [Ceftriaxone Sodium In Dextrose] Shortness Of Breath and Rash  . Clindamycin/Lincomycin Nausea And Vomiting  . Lyrica [Pregabalin] Other (See Comments)    PERSONALITY CHANGE    Review of Systems  Constitutional: Negative for chills and fever.  HENT: Negative for congestion, rhinorrhea and sore throat.   Respiratory: Negative for shortness of breath.   Cardiovascular: Negative for chest pain.  Gastrointestinal:  Positive for abdominal pain (15/10 yesterday.), blood in stool and constipation (Hard and painful). Negative for diarrhea, nausea and vomiting.  Genitourinary: Positive for difficulty urinating. Negative for dysuria, frequency and urgency.  Musculoskeletal: Negative for back pain.       Objective:    Physical Exam Vitals reviewed.  Constitutional:      Appearance:  Normal appearance. She is normal weight.  Cardiovascular:     Rate and Rhythm: Normal rate and regular rhythm.     Pulses: Normal pulses.     Heart sounds: Normal heart sounds.  Pulmonary:     Effort: Pulmonary effort is normal. No respiratory distress.     Breath sounds: Normal breath sounds.  Abdominal:     General: Abdomen is flat. Bowel sounds are normal.     Palpations: Abdomen is soft. There is no mass.     Tenderness: There is abdominal tenderness in the right lower quadrant and left lower quadrant. There is left CVA tenderness. There is no guarding or rebound.     Hernia: No hernia is present.  Genitourinary:    Rectum: Normal. No mass.  Neurological:     Mental Status: She is alert and oriented to person, place, and time.  Psychiatric:        Mood and Affect: Mood normal.        Behavior: Behavior normal.     BP (!) 132/68   Pulse 77   Temp 97.6 F (36.4 C)   Ht 5\' 1"  (1.549 m)   Wt (!) 89 lb 3.2 oz (40.5 kg)   BMI 16.85 kg/m  Wt Readings from Last 3 Encounters:  09/25/19 (!) 89 lb 3.2 oz (40.5 kg)  09/11/19 92 lb 6.4 oz (41.9 kg)  08/28/19 83 lb (37.6 kg)    Health Maintenance Due  Topic Date Due  . Hepatitis C Screening  Never done  . COVID-19 Vaccine (1) Never done  . MAMMOGRAM  Never done  . DEXA SCAN  Never done    There are no preventive care reminders to display for this patient.   Lab Results  Component Value Date   TSH 0.672 08/28/2019   Lab Results  Component Value Date   WBC 7.0 08/28/2019   HGB 12.5 08/28/2019   HCT 39.5 08/28/2019   MCV 94 08/28/2019   PLT 219 08/28/2019   Lab Results  Component Value Date   NA 139 08/28/2019   K 3.7 08/28/2019   CO2 26 08/28/2019   GLUCOSE 114 (H) 08/28/2019   BUN 13 08/28/2019   CREATININE 0.71 08/28/2019   BILITOT 0.3 08/28/2019   ALKPHOS 79 08/28/2019   AST 19 08/28/2019   ALT 15 08/28/2019   PROT 6.0 08/28/2019   ALBUMIN 3.6 (L) 08/28/2019   CALCIUM 8.9 08/28/2019   ANIONGAP 3 (L)  07/03/2016   No results found for: CHOL No results found for: HDL No results found for: LDLCALC No results found for: TRIG No results found for: CHOLHDL No results found for: HGBA1C     Assessment & Plan:  1. Lower abdominal pain - POCT URINALYSIS DIP (CLINITEK) Urinalysis was normal.  2. Chronic idiopathic constipation Linzess 145 mg once daily in am. Samples given. Stop tizanidine.  Patient is on amitriptyline, tramadol (qd), vicodin 3 times a week, methocarbamol bid and tizanidine once daily. She is not supposed to be on both muscle relaxants.  Orders Placed This Encounter  Procedures  . POCT URINALYSIS DIP (CLINITEK)     Follow-up: Return  in about 4 weeks (around 10/23/2019).  An After Visit Summary was printed and given to the patient.  [name] [facility] [phone]

## 2019-09-25 NOTE — Patient Instructions (Addendum)
Linzess 145 mg once daily in am.  Samples given.  Rx sent to pharmacy.  Stop tizanidine.   Chronic Constipation  Chronic constipation is a condition in which a person has three or fewer bowel movements a week, for three months or longer. This condition is especially common in older adults. The two main kinds of chronic constipation are secondary constipation and functional constipation. Secondary constipation results from another condition or a treatment. Functional constipation, also called primary or idiopathic constipation, is divided into three types:  Normal transit constipation. In this type, movement of stool through the colon (stool transit) occurs normally.  Slow transit constipation. In this type, stool moves slowly through the colon.  Outlet constipation or pelvic floor dysfunction. In this type, the nerves and muscles that empty the rectum do not work normally. What are the causes? Causes of secondary constipation may include:  Failing to drink enough fluid, eat enough food or fiber, or get physically active.  Pregnancy.  A tear in the anus (anal fissure).  Blockage in the bowel (bowel obstruction).  Narrowing of the bowel (bowel stricture).  Having a long-term medical condition, such as: ? Diabetes. ? Hypothyroidism. ? Multiple sclerosis. ? Parkinson disease. ? Stroke. ? Spinal cord injury. ? Dementia. ? Colon cancer. ? Inflammatory bowel disease (IBD). ? Iron-deficiency anemia. ? Outward collapse of the rectum (rectal prolapse). ? Hemorrhoids.  Taking certain medicines, including: ? Narcotics. These are a certain type of prescription pain medicine. ? Antacids. ? Iron supplements. ? Water pills (diuretics). ? Certain blood pressure medicines. ? Anti-seizure medicines. ? Antidepressants. ? Medicines for Parkinson disease. The cause of functional constipation is not known, but some conditions are associated with it. These conditions  include:  Stress.  Problems in the nerves and muscles that control stool transit.  Weak or impaired pelvic floor muscles. What increases the risk? You may be at higher risk for chronic constipation if you:  Are older than age 76.  Are female.  Live in a long-term care facility.  Do not get much exercise or physical activity (have a sedentary lifestyle).  Do not drink enough fluids.  Do not eat enough food, especially fiber.  Have a long-term disease.  Have a mental health disorder or eating disorder.  Take many medicines. What are the signs or symptoms? The main symptom of chronic constipation is having three or fewer bowel movements a week for several weeks. Other signs and symptoms may vary from person to person. These include:  Pushing hard (straining) to pass stool.  Painful bowel movements.  Having hard or lumpy stools.  Having lower belly discomfort, such as cramps or bloating.  Being unable to have a bowel movement when you feel the urge.  Feeling like you still need to pass stool after a bowel movement.  Feeling that you have something in your rectum that is blocking or preventing bowel movements.  Seeing blood on the toilet paper or in your stool.  Worsening confusion (in older adults). How is this diagnosed? This condition may be diagnosed based on:  Symptoms and medical history. You will be asked about your symptoms, lifestyle, diet, and any medicines that you are taking.  Physical exam. ? Your belly (abdomen) will be examined. ? A digital rectal exam may be done. For this exam, a health care provider places a lubricated, gloved finger into the rectum.  Other tests to check for any underlying causes of your constipation. These may be ordered if you have bleeding in your  rectum, weight loss, or a family history of colon cancer. In these cases, you may have: ? Imaging studies of the colon. These may include X-ray, ultrasound, or CT scan. ? Blood  tests. ? A procedure to examine the inside of your colon (colonoscopy). ? More specialized tests to check:  Whether your anal sphincter works well. This is a ring-shaped muscle that controls the closing of the anus.  How well food moves through your colon. ? Tests to measure the nerve signal in your pelvic floor muscles (electromyography). How is this treated? Treatment for chronic constipation depends on the cause. Most often, treatment starts with:  Being more active and getting regular exercise.  Drinking more fluids.  Adding fiber to your diet. Sources of fiber include fruits, vegetables, whole grains, and fiber supplements.  Using medicines such as stool softeners or medicines that increase contractions in your digestive system (pro-motility agents).  Training your pelvic muscles with biofeedback.  Surgery, if there is obstruction. Treatment for secondary chronic constipation depends on the underlying condition. You may need to:  Stop or change some medicines if they cause constipation.  Use a fiber supplement (bulk laxative) or stool softener.  Use prescription laxative. This works by [employer] into your colon (osmotic laxative). You may also need to see a specialist who treats conditions of the digestive system (gastroenterologist). Follow these instructions at home:   Take over-the-counter and prescription medicines only as told by your health care provider.  If you are taking a laxative, take it as told by your health care provider.  Eat a balanced diet that includes enough fiber. Ask your health care provider to recommend a diet that is right for you.  Drink clear fluids, especially water. Avoid drinking alcohol, caffeine, and soda.  Drink enough fluid to keep your urine pale yellow.  Get some physical activity every day. Ask your health care provider what physical activities are safe for you.  Get colon cancer screenings as told by your health care  provider.  Keep all follow-up visits as told by your health care provider. This is important. Contact a health care provider if:  You are having three or fewer bowel movements a week.  Your stools are hard or lumpy.  You notice blood on the toilet paper or in your stool after you have a bowel movement.  You have unexplained weight loss.  You have rectum (rectal) pain.  You have stool leakage.  You experience nausea or vomiting. Get help right away if:  You have rectal bleeding or you pass blood clots.  You have severe rectal pain.  You have body tissue that pushes out (protrudes) from your anus.  You have severe pain or bloating (distension) in your abdomen.  You have vomiting that you cannot control. Summary  Chronic constipation is a condition in which a person has three or fewer bowel movements a week, for three months or longer.  You may have a higher risk for this condition if you are an older adult, or if you do not drink enough water or get enough physical activity (are sedentary).  Treatment for this condition depends on the cause. Most treatments for chronic constipation include adding fiber to your diet, drinking more fluids, and getting more physical activity. You may also need to treat any underlying medical conditions or stop or change certain medicines if they cause constipation.  If lifestyle changes do not relieve constipation, your health care provider may recommend taking a laxative. This information is  not intended to replace advice given to you by your health care provider. Make sure you discuss any questions you have with your health care provider. Document Revised: 02/01/2017 Document Reviewed: 11/06/2016 Elsevier Patient Education  [place].

## 2019-10-01 ENCOUNTER — Other Ambulatory Visit: Payer: Self-pay | Admitting: Family Medicine

## 2019-10-02 ENCOUNTER — Other Ambulatory Visit: Payer: Self-pay | Admitting: Family Medicine

## 2019-10-05 ENCOUNTER — Other Ambulatory Visit: Payer: Self-pay | Admitting: Family Medicine

## 2019-10-05 ENCOUNTER — Other Ambulatory Visit: Payer: Self-pay

## 2019-10-05 DIAGNOSIS — M5416 Radiculopathy, lumbar region: Secondary | ICD-10-CM

## 2019-10-05 MED ORDER — HYDROCODONE-ACETAMINOPHEN 5-325 MG PO TABS: 1.0000 | ORAL_TABLET | Freq: Two times a day (BID) | ORAL | 0 refills | Status: DC | PRN

## 2019-10-05 NOTE — Telephone Encounter (Signed)
Anne [name] called to request a refill on her hydrocodone.  She also wanted to make Dr. Tobie Poet aware that she has an appointment with GI August 2024.  She also has had two episodes of fecal incontinence that she had no warning of.  She is going to discuss this with GI later in the month.

## 2019-10-23 DIAGNOSIS — R109 Unspecified abdominal pain: Secondary | ICD-10-CM | POA: Diagnosis not present

## 2019-10-23 DIAGNOSIS — K5909 Other constipation: Secondary | ICD-10-CM | POA: Diagnosis not present

## 2019-10-26 NOTE — Progress Notes (Signed)
Acute Office Visit  Subjective:    Patient ID: Anne WETHERBEE, female    DOB: 1945/12/18, 74 y.o.   MRN: [id number]  Chief Complaint  Patient presents with  . Abdominal Pain    Follow up    HPI Patient is in today for LLQ Pain 4 week follow up, was seen by GI however she was unhappy with her visit and ended up leaving due to his lack of bedside manner.  She states it was not discussed that they wanted her to have blood work or a colonoscopy and yet she received a message to pick up a prep and handed an order for labs at the end of the appointment despite it never being discussed. Her abdomen was not examined during the visit.  Has constipation - hard stools three times a week. Not taking a stool softener. Samples of Linzess given which helped.   Past Medical History:  Diagnosis Date  . Asthma    daily inhalers  . Breast cancer ([place]) 05/2015   left  . COPD (chronic obstructive pulmonary disease) ([place])    denies SOB with ADLs; no O2  . Dental crowns present   . Depression   . History of concussion 01/01/2014  . History of kidney stones   . History of TIA (transient ischemic attack) 10/2014  . Hyperlipidemia   . Insomnia   . Migraine   . Nasal congestion 05/26/2015   will finish z-pak 05/27/2015  . Nonproductive cough 05/26/2015  . PONV (postoperative nausea and vomiting)    "long time ago"  none recently  . RLS (restless legs syndrome)   . Tobacco abuse   . Vitamin D deficiency   . Wears dentures    lower  . Wears partial dentures    upper    Past Surgical History:  Procedure Laterality Date  . ABDOMINAL HYSTERECTOMY  2000   complete  . APPENDECTOMY  2000  . BREAST BIOPSY Left   . BREAST RECONSTRUCTION WITH PLACEMENT OF TISSUE EXPANDER AND FLEX HD (ACELLULAR HYDRATED DERMIS) Bilateral 06/02/2015   Procedure: BILATERAL BREAST RECONSTRUCTION WITH PLACEMENT OF TISSUE EXPANDER AND  ACELLULAR HYDRATED DERMIS;  Surgeon: Irene Limbo, MD;  Location: [facility];  Service: Plastics;  Laterality: Bilateral;  . CARPAL TUNNEL RELEASE Bilateral   . CATARACT EXTRACTION Bilateral 09/2013  . KNEE ARTHROSCOPY Left   . LOOP RECORDER INSERTION N/A 05/11/2016   Procedure: Loop Recorder Insertion;  Surgeon: Will Meredith Leeds, MD;  Location: [facility];  Service: Cardiovascular;  Laterality: N/A;  . MASTECTOMY W/ SENTINEL NODE BIOPSY Bilateral 06/02/2015   Procedure: BILATERAL MASTECTOMY WITH LEFT SENTINEL LYMPH NODE BIOPSY;  Surgeon: Stark Klein, MD;  Location: [facility];  Service: General;  Laterality: Bilateral;  . NASAL SEPTUM SURGERY     x 3  . PLANTAR FASCIA SURGERY Left   . REMOVAL OF BILATERAL TISSUE EXPANDERS WITH PLACEMENT OF BILATERAL BREAST IMPLANTS Bilateral 06/17/2015   Procedure: DEBRIDEMENT OF BILATERAL MASTECTOMY FLAP WITH BILATERAL TISSUE EXPANDER EXCHANGE ;  Surgeon: Irene Limbo, MD;  Location: [facility];  Service: Plastics;  Laterality: Bilateral;  . REMOVAL OF TISSUE EXPANDER Bilateral 07/12/2015   Procedure: REMOVAL OF BILATERAL TISSUE EXPANDERS;  Surgeon: Irene Limbo, MD;  Location: [facility];  Service: Plastics;  Laterality: Bilateral;    Family History  Problem Relation Age of Onset  . Cervical cancer Mother 92  . Colon cancer Maternal Grandfather  mets to stomach; dx. 67-70  . Heart disease Father   . Prostate cancer Father 58  . Cervical cancer Maternal Grandmother        dx. 71s; treated with radium implant  . Lung cancer Sister 78       maternal half-sister dx. lung cancer, stage III  . Breast cancer Maternal Aunt   . Cervical cancer Sister 1       paternal half-sister; s/p TAH  . Spina bifida Grandchild   . Brain cancer Grandchild        grandson dx. at 20 mos, treated at [facility]  . Breast cancer Other 22       maternal half-sister's daughter  . Cancer Maternal Aunt        d. 27; unspecified type - "began at back area and moved to vital organs"  . Cancer Maternal  Uncle         late 4s; unspecified type; "started at back and moved to vital organs"    Social History   Socioeconomic History  . Marital status: Widowed    Spouse name: Not on file  . Number of children: 1  . Years of education: 73  . Highest education level: Not on file  Occupational History    Comment: [employer], retired  Tobacco Use  . Smoking status: Light Tobacco Smoker    Packs/day: 0.25    Types: Cigarettes    Last attempt to quit: 05/04/2015    Years since quitting: 4.4  . Smokeless tobacco: Never Used  Substance and Sexual Activity  . Alcohol use: Yes    Alcohol/week: 0.0 standard drinks    Comment: rarely  . Drug use: No  . Sexual activity: Not on file  Other Topics Concern  . Not on file  Social History Narrative   Lives alone, widow   Caffeine use- tea, 1 cup daily   Social Determinants of Health   Financial Resource Strain:   . Difficulty of Paying Living Expenses: Not on file  Food Insecurity:   . Worried About Charity fundraiser in the Last Year: Not on file  . Ran Out of Food in the Last Year: Not on file  Transportation Needs:   . Lack of Transportation (Medical): Not on file  . Lack of Transportation (Non-Medical): Not on file  Physical Activity:   . Days of Exercise per Week: Not on file  . Minutes of Exercise per Session: Not on file  Stress:   . Feeling of Stress : Not on file  Social Connections:   . Frequency of Communication with Friends and Family: Not on file  . Frequency of Social Gatherings with Friends and Family: Not on file  . Attends Religious Services: Not on file  . Active Member of Clubs or Organizations: Not on file  . Attends Archivist Meetings: Not on file  . Marital Status: Not on file  Intimate Partner Violence:   . Fear of Current or Ex-Partner: Not on file  . Emotionally Abused: Not on file  . Physically Abused: Not on file  . Sexually Abused: Not on file    Outpatient Medications Prior to  Visit  Medication Sig Dispense Refill  . albuterol (VENTOLIN HFA) 108 (90 Base) MCG/ACT inhaler Inhale 1-2 puffs into the lungs every 6 (six) hours as needed for wheezing or shortness of breath. 18 g 0  . amitriptyline (ELAVIL) 75 MG tablet TAKE 1 TABLET EVERY DAY 90 tablet 1  .  atorvastatin (LIPITOR) 20 MG tablet Take 1 tablet (20 mg total) by mouth daily. 90 tablet 0  . cetirizine (ZYRTEC) 10 MG tablet Take 1 tablet (10 mg total) by mouth daily. 30 tablet 11  . Cholecalciferol (VITAMIN D3) 50000 UNITS CAPS Take 50,000 Units by mouth once a week.     . clotrimazole (MYCELEX) 10 MG troche Take 1 tablet (10 mg total) by mouth 5 (five) times daily. 50 Troche 0  . fenofibrate 160 MG tablet Take 1 tablet (160 mg total) by mouth daily. 90 tablet 0  . fluorometholone (FML) 0.1 % ophthalmic suspension     . Fluticasone-Umeclidin-Vilant 100-62.5-25 MCG/INH AEPB Inhale into the lungs.    . furosemide (LASIX) 20 MG tablet Take 20 mg by mouth daily as needed for edema (for leg swelling).     . gabapentin (NEURONTIN) 300 MG capsule TAKE 1 CAPSULE BY MOUTH THREE TIMES DAILY 90 capsule 3  . HYDROcodone-acetaminophen (NORCO/VICODIN) 5-325 MG tablet Take 1 tablet by mouth 2 (two) times daily as needed for severe pain. 60 tablet 0  . methocarbamol (ROBAXIN) 500 MG tablet Take 500 mg by mouth.    . mirtazapine (REMERON) 45 MG tablet TAKE 1 TABLET BY MOUTH AT BEDTIME 30 tablet 2  . potassium chloride SA (KLOR-CON) 20 MEQ tablet TAKE 1 TABLET BY MOUTH ONCE DAILY WITH FOOD    . primidone (MYSOLINE) 50 MG tablet TAKE 1 TABLET BY MOUTH IN THE MORNING AND 1 AT BEDTIME 60 tablet 0  . promethazine (PHENERGAN) 25 MG tablet Take 1 tablet by mouth as needed for nausea/vomiting.    . RESTASIS 0.05 % ophthalmic emulsion     . SUMAtriptan (IMITREX) 100 MG tablet Take 1 tablet (100 mg total) by mouth daily as needed for migraine (100 mg and then may repeat in 2 hours if headache persists). 10 tablet 2  . tamoxifen (NOLVADEX) 10  MG tablet     . temazepam (RESTORIL) 15 MG capsule TAKE 1 CAPSULE BY MOUTH ONCE DAILY AT BEDTIME AS NEEDED 90 capsule 0  . tiZANidine (ZANAFLEX) 4 MG tablet TAKE 1 TABLET BY MOUTH EVERY 8 HOURS AS NEEDED NOT  TO  EXCEED  3  DOSES  IN  24  HOURS 120 tablet 0  . traMADol (ULTRAM) 50 MG tablet TAKE 1 TABLET BY MOUTH EVERY 12 HOURS AS NEEDED FOR SEVERE ABDOMINAL PAIN 60 tablet 0  . traZODone (DESYREL) 100 MG tablet TAKE 1 TABLET (100 MG TOTAL) BY MOUTH AT BEDTIME AS NEEDED FOR SLEEP. 90 tablet 0  . venlafaxine XR (EFFEXOR-XR) 75 MG 24 hr capsule Take 3 capsules (225 mg total) by mouth daily. 270 capsule 0  . Vitamin D, Ergocalciferol, (DRISDOL) 1.25 MG (50000 UNIT) CAPS capsule      Facility-Administered Medications Prior to Visit  Medication Dose Route Frequency Provider Last Rate Last Admin  . ipratropium-albuterol (DUONEB) 0.5-2.5 (3) MG/3ML nebulizer solution 3 mL  3 mL Nebulization Once [name], MD        Allergies  Allergen Reactions  . Rocephin [Ceftriaxone Sodium In Dextrose] Shortness Of Breath and Rash  . Clindamycin/Lincomycin Nausea And Vomiting  . Lyrica [Pregabalin] Other (See Comments)    PERSONALITY CHANGE    Review of Systems  Constitutional: Negative for chills and fever.  HENT: Negative for congestion, rhinorrhea and sore throat.   Respiratory: Negative for shortness of breath.   Cardiovascular: Negative for chest pain.  Gastrointestinal: Positive for abdominal pain and constipation (Hard and painful). Negative for blood in  stool, diarrhea, nausea and vomiting.  Genitourinary: Negative for difficulty urinating.  Musculoskeletal: Negative for back pain.       Objective:    Physical Exam Vitals reviewed.  Constitutional:      Appearance: Normal appearance. She is normal weight.  Cardiovascular:     Rate and Rhythm: Normal rate and regular rhythm.     Pulses: Normal pulses.     Heart sounds: Normal heart sounds.  Pulmonary:     Effort: Pulmonary effort is  normal. No respiratory distress.     Breath sounds: Normal breath sounds.  Abdominal:     General: Abdomen is flat. Bowel sounds are normal.     Palpations: Abdomen is soft. There is no mass.     Tenderness: There is abdominal tenderness in the right lower quadrant and left lower quadrant. There is left CVA tenderness. There is no guarding or rebound.     Hernia: No hernia is present.  Genitourinary:    Rectum: Normal. No mass.  Neurological:     Mental Status: She is alert and oriented to person, place, and time.  Psychiatric:        Mood and Affect: Mood normal.        Behavior: Behavior normal.     BP 110/64   Pulse 83   Temp (!) 97.3 F (36.3 C)   Ht 5\' 1"  (1.549 m)   Wt 96 lb 9.6 oz (43.8 kg)   SpO2 99%   BMI 18.25 kg/m  Wt Readings from Last 3 Encounters:  10/30/19 94 lb 6.4 oz (42.8 kg)  10/27/19 96 lb 9.6 oz (43.8 kg)  09/25/19 (!) 89 lb 3.2 oz (40.5 kg)    Health Maintenance Due  Topic Date Due  . Hepatitis C Screening  Never done  . COVID-19 Vaccine (1) Never done  . MAMMOGRAM  Never done  . DEXA SCAN  Never done  . INFLUENZA VACCINE  10/04/2019    There are no preventive care reminders to display for this patient.   Lab Results  Component Value Date   TSH 0.672 08/28/2019   Lab Results  Component Value Date   WBC 7.0 08/28/2019   HGB 12.5 08/28/2019   HCT 39.5 08/28/2019   MCV 94 08/28/2019   PLT 219 08/28/2019   Lab Results  Component Value Date   NA 139 08/28/2019   K 3.7 08/28/2019   CO2 26 08/28/2019   GLUCOSE 114 (H) 08/28/2019   BUN 13 08/28/2019   CREATININE 0.71 08/28/2019   BILITOT 0.3 08/28/2019   ALKPHOS 79 08/28/2019   AST 19 08/28/2019   ALT 15 08/28/2019   PROT 6.0 08/28/2019   ALBUMIN 3.6 (L) 08/28/2019   CALCIUM 8.9 08/28/2019   ANIONGAP 3 (L) 07/03/2016   No results found for: CHOL No results found for: HDL No results found for: LDLCALC No results found for: TRIG No results found for: CHOLHDL No results found  for: HGBA1C     Assessment & Plan:  1. Lower abdominal pain May be all secondary to chronic constipation due to medications. I have tried to wean off some of her meds unsuccessfully. Patient also has chronic abdominal pain to the significant abdominal surgery with complications done in 04/3555. - Ambulatory referral to Gastroenterology  2. Bowel habit changes Worsening - Ambulatory referral to Gastroenterology  3. Chronic constipation - linaclotide (LINZESS) 72 MCG capsule; Take 1 capsule (72 mcg total) by mouth daily before breakfast.  Dispense: 30 capsule; Refill: 3 -  Ambulatory referral to [facility] for pharmacy management.   Follow-up: Return in about 3 months (around 01/27/2020) for fasting.  An After Visit Summary was printed and given to the patient.  [name] [facility] [phone]

## 2019-10-27 ENCOUNTER — Ambulatory Visit (INDEPENDENT_AMBULATORY_CARE_PROVIDER_SITE_OTHER): Payer: BC Managed Care – PPO | Admitting: Family Medicine

## 2019-10-27 ENCOUNTER — Encounter: Payer: Self-pay | Admitting: Family Medicine

## 2019-10-27 ENCOUNTER — Other Ambulatory Visit: Payer: Self-pay

## 2019-10-27 ENCOUNTER — Ambulatory Visit: Payer: Medicare Other | Admitting: Family Medicine

## 2019-10-27 VITALS — BP 110/64 | HR 83 | Temp 97.3°F | Ht 61.0 in | Wt 96.6 lb

## 2019-10-27 DIAGNOSIS — R194 Change in bowel habit: Secondary | ICD-10-CM | POA: Diagnosis not present

## 2019-10-27 DIAGNOSIS — R1030 Lower abdominal pain, unspecified: Secondary | ICD-10-CM

## 2019-10-27 DIAGNOSIS — K5909 Other constipation: Secondary | ICD-10-CM | POA: Diagnosis not present

## 2019-10-27 MED ORDER — LINACLOTIDE 72 MCG PO CAPS: 72.0000 ug | ORAL_CAPSULE | Freq: Every day | ORAL | 3 refills | Status: DC

## 2019-10-28 DIAGNOSIS — R072 Precordial pain: Secondary | ICD-10-CM | POA: Diagnosis not present

## 2019-10-28 DIAGNOSIS — Z20822 Contact with and (suspected) exposure to covid-19: Secondary | ICD-10-CM | POA: Diagnosis not present

## 2019-10-28 DIAGNOSIS — J441 Chronic obstructive pulmonary disease with (acute) exacerbation: Secondary | ICD-10-CM | POA: Diagnosis not present

## 2019-10-28 DIAGNOSIS — R0602 Shortness of breath: Secondary | ICD-10-CM | POA: Diagnosis not present

## 2019-10-28 DIAGNOSIS — F17200 Nicotine dependence, unspecified, uncomplicated: Secondary | ICD-10-CM | POA: Diagnosis not present

## 2019-10-30 ENCOUNTER — Ambulatory Visit (INDEPENDENT_AMBULATORY_CARE_PROVIDER_SITE_OTHER): Payer: BC Managed Care – PPO | Admitting: Family Medicine

## 2019-10-30 ENCOUNTER — Other Ambulatory Visit: Payer: Self-pay

## 2019-10-30 VITALS — BP 136/60 | HR 100 | Temp 97.2°F | Resp 16 | Ht 61.0 in | Wt 94.4 lb

## 2019-10-30 DIAGNOSIS — J441 Chronic obstructive pulmonary disease with (acute) exacerbation: Secondary | ICD-10-CM

## 2019-10-30 NOTE — Progress Notes (Signed)
Acute Office Visit  Subjective:    Patient ID: Anne [name], female    DOB: September 03, 1945, 74 y.o.   MRN: [id number]  Chief Complaint  Patient presents with   Hospitalization Follow-up    HPI Patient is in today for follow up from ED visit. No pneumonia. No covid  Or influenza. Cardiac proteins and ddimer negative. O2 saturation was 87% upon arrival, but improved prior to discharge. Diagnosed with copd exacerbation. Given prednisone and doxycycline. Using duoneb every 12 hours. Unable to take trelegy.  Past Medical History:  Diagnosis Date   Asthma    daily inhalers   Breast cancer ([place]) 05/2015   left   COPD (chronic obstructive pulmonary disease) ([place])    denies SOB with ADLs; no O2   Dental crowns present    Depression    History of concussion 01/01/2014   History of kidney stones    History of TIA (transient ischemic attack) 10/2014   Hyperlipidemia    Insomnia    Migraine    Nasal congestion 05/26/2015   will finish z-pak 05/27/2015   Nonproductive cough 05/26/2015   PONV (postoperative nausea and vomiting)    "long time ago"  none recently   RLS (restless legs syndrome)    Tobacco abuse    Vitamin D deficiency    Wears dentures    lower   Wears partial dentures    upper    Past Surgical History:  Procedure Laterality Date   ABDOMINAL HYSTERECTOMY     APPENDECTOMY  2000   BREAST BIOPSY Left    BREAST RECONSTRUCTION WITH PLACEMENT OF TISSUE EXPANDER AND FLEX HD (ACELLULAR HYDRATED DERMIS) Bilateral 06/02/2015   Procedure: BILATERAL BREAST RECONSTRUCTION WITH PLACEMENT OF TISSUE EXPANDER AND  ACELLULAR HYDRATED DERMIS;  Surgeon: Irene Limbo, MD;  Location: [facility];  Service: Plastics;  Laterality: Bilateral;   CARPAL TUNNEL RELEASE Bilateral    CATARACT EXTRACTION Bilateral 09/2013   KNEE ARTHROSCOPY Left    LAPAROSCOPIC ABDOMINAL EXPLORATION N/A 10/2017   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 Perforation of small intestion requiring repair.    LOOP RECORDER INSERTION N/A 05/11/2016   Procedure: Loop Recorder Insertion;  Surgeon: Will Meredith Leeds, MD;  Location: [facility];  Service: Cardiovascular;  Laterality: N/A;   MASTECTOMY W/ SENTINEL NODE BIOPSY Bilateral 06/02/2015   Procedure: BILATERAL MASTECTOMY WITH LEFT SENTINEL LYMPH NODE BIOPSY;  Surgeon: Stark Klein, MD;  Location: [facility];  Service: General;  Laterality: Bilateral;   NASAL SEPTUM SURGERY     x 3   PLANTAR FASCIA SURGERY Left    REMOVAL OF BILATERAL TISSUE EXPANDERS WITH PLACEMENT OF BILATERAL BREAST IMPLANTS Bilateral 06/17/2015   Procedure: DEBRIDEMENT OF BILATERAL MASTECTOMY FLAP WITH BILATERAL TISSUE EXPANDER EXCHANGE ;  Surgeon: Irene Limbo, MD;  Location: [facility];  Service: Plastics;  Laterality: Bilateral;   REMOVAL OF TISSUE EXPANDER Bilateral 07/12/2015   Procedure: REMOVAL OF BILATERAL TISSUE EXPANDERS;  Surgeon: Irene Limbo, MD;  Location: [facility];  Service: Plastics;  Laterality: Bilateral;    Family History  Problem Relation Age of Onset   Cervical cancer Mother 50   Colon cancer Maternal Grandfather        mets to stomach; dx. 67-70   Heart disease Father    Prostate cancer Father 62   Cervical cancer Maternal Grandmother        dx. 24s; treated with radium implant   Lung cancer Sister  42       maternal half-sister dx. lung cancer, stage III   Breast cancer Maternal Aunt    Cervical cancer Sister 32       paternal half-sister; s/p TAH   Spina bifida Grandchild    Brain cancer Grandchild        grandson dx. at 20 mos, treated at [facility] Other 38       maternal half-sister's daughter   Cancer Maternal Aunt        d. 72; unspecified type - "began at back area and moved to vital organs"   Cancer Maternal Uncle         late 64s; unspecified type; "started at back and moved  to vital organs"    Social History   Socioeconomic History   Marital status: Widowed    Spouse name: Not on file   Number of children: 1   Years of education: 67   Highest education level: Not on file  Occupational History    Comment: [employer], retired  Tobacco Use   Smoking status: Light Tobacco Smoker    Packs/day: 0.25    Types: Cigarettes    Last attempt to quit: 05/04/2015    Years since quitting: 4.5   Smokeless tobacco: Never Used  Substance and Sexual Activity   Alcohol use: Yes    Alcohol/week: 0.0 standard drinks    Comment: rarely   Drug use: No   Sexual activity: Not on file  Other Topics Concern   Not on file  Social History Narrative   Lives alone, widow   Caffeine use- tea, 1 cup daily   Social Determinants of Health   Financial Resource Strain:    Difficulty of Paying Living Expenses: Not on file  Food Insecurity:    Worried About Running Out of Food in the Last Year: Not on file   Ran Out of Food in the Last Year: Not on file  Transportation Needs:    Lack of Transportation (Medical): Not on file   Lack of Transportation (Non-Medical): Not on file  Physical Activity:    Days of Exercise per Week: Not on file   Minutes of Exercise per Session: Not on file  Stress:    Feeling of Stress : Not on file  Social Connections:    Frequency of Communication with Friends and Family: Not on file   Frequency of Social Gatherings with Friends and Family: Not on file   Attends Religious Services: Not on file   Active Member of [place] or Organizations: Not on file   Attends Archivist Meetings: Not on file   Marital Status: Not on file  Intimate Partner Violence:    Fear of Current or Ex-Partner: Not on file   Emotionally Abused: Not on file   Physically Abused: Not on file   Sexually Abused: Not on file    Outpatient Medications Prior to Visit  Medication Sig Dispense Refill   doxycycline (DORYX) 100 MG  EC tablet Take 100 mg by mouth.     predniSONE (DELTASONE) 20 MG tablet Take 20 mg by mouth daily with breakfast.     albuterol (VENTOLIN HFA) 108 (90 Base) MCG/ACT inhaler Inhale 1-2 puffs into the lungs every 6 (six) hours as needed for wheezing or shortness of breath. 18 g 0   amitriptyline (ELAVIL) 75 MG tablet TAKE 1 TABLET EVERY DAY 90 tablet 1   atorvastatin (LIPITOR) 20 MG tablet Take 1 tablet (20  mg total) by mouth daily. 90 tablet 0   cetirizine (ZYRTEC) 10 MG tablet Take 1 tablet (10 mg total) by mouth daily. 30 tablet 11   Cholecalciferol (VITAMIN D3) 50000 UNITS CAPS Take 50,000 Units by mouth once a week.      clotrimazole (MYCELEX) 10 MG troche Take 1 tablet (10 mg total) by mouth 5 (five) times daily. 50 Troche 0   fenofibrate 160 MG tablet Take 1 tablet (160 mg total) by mouth daily. 90 tablet 0   fluorometholone (FML) 0.1 % ophthalmic suspension      Fluticasone-Umeclidin-Vilant 100-62.5-25 MCG/INH AEPB Inhale into the lungs.     furosemide (LASIX) 20 MG tablet Take 20 mg by mouth daily as needed for edema (for leg swelling).      gabapentin (NEURONTIN) 300 MG capsule TAKE 1 CAPSULE BY MOUTH THREE TIMES DAILY 90 capsule 3   HYDROcodone-acetaminophen (NORCO/VICODIN) 5-325 MG tablet Take 1 tablet by mouth 2 (two) times daily as needed for severe pain. 60 tablet 0   linaclotide (LINZESS) 72 MCG capsule Take 1 capsule (72 mcg total) by mouth daily before breakfast. 30 capsule 3   methocarbamol (ROBAXIN) 500 MG tablet Take 500 mg by mouth.     mirtazapine (REMERON) 45 MG tablet TAKE 1 TABLET BY MOUTH AT BEDTIME 30 tablet 2   potassium chloride SA (KLOR-CON) 20 MEQ tablet TAKE 1 TABLET BY MOUTH ONCE DAILY WITH FOOD     primidone (MYSOLINE) 50 MG tablet TAKE 1 TABLET BY MOUTH IN THE MORNING AND 1 AT BEDTIME 60 tablet 0   promethazine (PHENERGAN) 25 MG tablet Take 1 tablet by mouth as needed for nausea/vomiting.     RESTASIS 0.05 % ophthalmic emulsion       SUMAtriptan (IMITREX) 100 MG tablet Take 1 tablet (100 mg total) by mouth daily as needed for migraine (100 mg and then may repeat in 2 hours if headache persists). 10 tablet 2   tamoxifen (NOLVADEX) 10 MG tablet      temazepam (RESTORIL) 15 MG capsule TAKE 1 CAPSULE BY MOUTH ONCE DAILY AT BEDTIME AS NEEDED 90 capsule 0   tiZANidine (ZANAFLEX) 4 MG tablet TAKE 1 TABLET BY MOUTH EVERY 8 HOURS AS NEEDED NOT  TO  EXCEED  3  DOSES  IN  24  HOURS 120 tablet 0   traMADol (ULTRAM) 50 MG tablet TAKE 1 TABLET BY MOUTH EVERY 12 HOURS AS NEEDED FOR SEVERE ABDOMINAL PAIN 60 tablet 0   traZODone (DESYREL) 100 MG tablet TAKE 1 TABLET (100 MG TOTAL) BY MOUTH AT BEDTIME AS NEEDED FOR SLEEP. 90 tablet 0   venlafaxine XR (EFFEXOR-XR) 75 MG 24 hr capsule Take 3 capsules (225 mg total) by mouth daily. 270 capsule 0   Vitamin D, Ergocalciferol, (DRISDOL) 1.25 MG (50000 UNIT) CAPS capsule      Facility-Administered Medications Prior to Visit  Medication Dose Route Frequency Provider Last Rate Last Admin   ipratropium-albuterol (DUONEB) 0.5-2.5 (3) MG/3ML nebulizer solution 3 mL  3 mL Nebulization Once Kaspar Albornoz, MD        Allergies  Allergen Reactions   Rocephin [Ceftriaxone Sodium In Dextrose] Shortness Of Breath and Rash   Clindamycin/Lincomycin Nausea And Vomiting   Lyrica [Pregabalin] Other (See Comments)    PERSONALITY CHANGE    Review of Systems  Constitutional: Negative for chills, fatigue and fever.  HENT: Negative for congestion, ear pain and sore throat.   Respiratory: Positive for cough and shortness of breath (improved.).   Cardiovascular: Negative for  chest pain.       Objective:    Physical Exam Vitals reviewed.  Constitutional:      Appearance: Normal appearance.  Cardiovascular:     Rate and Rhythm: Normal rate and regular rhythm.     Heart sounds: Normal heart sounds.  Pulmonary:     Effort: Pulmonary effort is normal.     Breath sounds: Normal breath sounds. No  wheezing, rhonchi or rales.  Neurological:     Mental Status: She is alert.  Psychiatric:        Mood and Affect: Mood normal.        Behavior: Behavior normal.     BP 136/60    Pulse 100    Temp (!) 97.2 F (36.2 C)    Resp 16    Ht 5\' 1"  (1.549 m)    Wt 94 lb 6.4 oz (42.8 kg)    BMI 17.84 kg/m  Wt Readings from Last 3 Encounters:  10/30/19 94 lb 6.4 oz (42.8 kg)  10/27/19 96 lb 9.6 oz (43.8 kg)  09/25/19 (!) 89 lb 3.2 oz (40.5 kg)    Health Maintenance Due  Topic Date Due   Hepatitis C Screening  Never done   COVID-19 Vaccine (1) Never done   MAMMOGRAM  Never done   DEXA SCAN  Never done   INFLUENZA VACCINE  10/04/2019    There are no preventive care reminders to display for this patient.   Lab Results  Component Value Date   TSH 0.672 08/28/2019   Lab Results  Component Value Date   WBC 7.0 08/28/2019   HGB 12.5 08/28/2019   HCT 39.5 08/28/2019   MCV 94 08/28/2019   PLT 219 08/28/2019   Lab Results  Component Value Date   NA 139 08/28/2019   K 3.7 08/28/2019   CO2 26 08/28/2019   GLUCOSE 114 (H) 08/28/2019   BUN 13 08/28/2019   CREATININE 0.71 08/28/2019   BILITOT 0.3 08/28/2019   ALKPHOS 79 08/28/2019   AST 19 08/28/2019   ALT 15 08/28/2019   PROT 6.0 08/28/2019   ALBUMIN 3.6 (L) 08/28/2019   CALCIUM 8.9 08/28/2019   ANIONGAP 3 (L) 07/03/2016      Assessment & Plan:  1. COPD exacerbation ([place]) The current medical regimen is effective;  continue present plan and medications.  Follow-up: Return if symptoms worsen or fail to improve.  An After Visit Summary was printed and given to the patient.  [name] [facility] [phone]

## 2019-11-01 ENCOUNTER — Encounter: Payer: Self-pay | Admitting: Family Medicine

## 2019-11-10 ENCOUNTER — Encounter: Payer: Self-pay | Admitting: Family Medicine

## 2019-11-12 ENCOUNTER — Telehealth: Payer: Self-pay

## 2019-11-12 NOTE — Telephone Encounter (Signed)
Anne [name] called with severe LUQ pain which started yesterday.  She denies nausea or vomiting.  She was scheduled to see GI but had to cancel the appointment because of her work schedule.  She is rating the pain (0-10) as a 20.  Dr. Tobie Poet advised that she go immediately to the ED for evaluation and tx.  She was encouraged to have someone drive her.

## 2019-11-13 ENCOUNTER — Telehealth: Payer: Self-pay | Admitting: Family Medicine

## 2019-11-13 NOTE — Progress Notes (Signed)
  Chronic Care Management   Note  11/13/2019 Name: MARYLU DUDENHOEFFER MRN: [id number] DOB: [birth year]  [name] is a 74 y.o. year old female who is a primary care patient of Cox, Kirsten, MD. I reached out to [name] by phone today in response to a referral sent by Ms. Kelliann L Enyeart's PCP, Cox, Kirsten, MD.   Ms. Perazzo was given information about Chronic Care Management services today including:  1. CCM service includes personalized support from designated clinical staff supervised by her physician, including individualized plan of care and coordination with other care providers 2. 24/7 contact phone numbers for assistance for urgent and routine care needs. 3. Service will only be billed when office clinical staff spend 20 minutes or more in a month to coordinate care. 4. Only one practitioner may furnish and bill the service in a calendar month. 5. The patient may stop CCM services at any time (effective at the end of the month) by phone call to the office staff.   Patient wishes to consider information provided and/or speak with a member of the care team before deciding about enrollment in care management services.   Follow up plan:   Earney [place] Upstream Scheduler

## 2019-12-03 ENCOUNTER — Telehealth (INDEPENDENT_AMBULATORY_CARE_PROVIDER_SITE_OTHER): Payer: BC Managed Care – PPO | Admitting: Family Medicine

## 2019-12-03 ENCOUNTER — Encounter: Payer: Self-pay | Admitting: Family Medicine

## 2019-12-03 ENCOUNTER — Other Ambulatory Visit: Payer: Self-pay | Admitting: Family Medicine

## 2019-12-03 DIAGNOSIS — B370 Candidal stomatitis: Secondary | ICD-10-CM

## 2019-12-03 DIAGNOSIS — J441 Chronic obstructive pulmonary disease with (acute) exacerbation: Secondary | ICD-10-CM

## 2019-12-03 MED ORDER — CLOTRIMAZOLE 10 MG MT TROC: 10.0000 mg | Freq: Every day | OROMUCOSAL | 1 refills | Status: DC

## 2019-12-03 MED ORDER — PREDNISONE 20 MG PO TABS: ORAL_TABLET | ORAL | 0 refills | Status: AC

## 2019-12-03 MED ORDER — TRELEGY ELLIPTA 200-62.5-25 MCG/INH IN AEPB: 1.0000 | INHALATION_SPRAY | Freq: Every day | RESPIRATORY_TRACT | 2 refills | Status: DC

## 2019-12-03 MED ORDER — LEVOFLOXACIN 750 MG PO TABS: 750.0000 mg | ORAL_TABLET | Freq: Every day | ORAL | 0 refills | Status: DC

## 2019-12-03 NOTE — Progress Notes (Addendum)
Virtual Visit via Telephone Note   This visit type was conducted due to national recommendations for restrictions regarding the COVID-19 Pandemic (e.g. social distancing) in an effort to limit this patient's exposure and mitigate transmission in our community.  Due to her co-morbid illnesses, this patient is at least at moderate risk for complications without adequate follow up.  This format is felt to be most appropriate for this patient at this time.  The patient did not have access to video technology/had technical difficulties with video requiring transitioning to audio format only (telephone).  All issues noted in this document were discussed and addressed.  No physical exam could be performed with this format.  Patient verbally consented to a telehealth visit.   Date:  12/03/2019   ID:  Anne [name], DOB October 10, 1945, MRN [id number]  Patient Location: Home Provider Location: Office/Clinic  PCP:  Anne [name], Anne [name]   Evaluation Performed: acute Chief Complaint:  cough  History of Present Illness:    Anne [name] is a 74 y.o. female with cough and sob. Pt was [facility] at [facility] on 11/24/2019 put on prednisone and levaquin. Covid 19 negative. Had covid vaccine (June 03, 2019.) Fever resolved. C/o sore throat and sinus infection. Nasal congestion. Using trelegy one inhalation daily and ventolin about 6 times a day.   The patient does have symptoms concerning for COVID-19 infection. Has been vaccinated.   Past Medical History:  Diagnosis Date  . Asthma    daily inhalers  . Breast cancer ([place]) 05/2015   left  . COPD (chronic obstructive pulmonary disease) ([place])    denies SOB with ADLs; no O2  . Dental crowns present   . Depression   . History of concussion 01/01/2014  . History of kidney stones   . History of TIA (transient ischemic attack) 10/2014  . Hyperlipidemia   . Insomnia   . Migraine   . Nasal congestion 05/26/2015   will finish z-pak 05/27/2015  . Nonproductive cough  05/26/2015  . PONV (postoperative nausea and vomiting)    "long time ago"  none recently  . RLS (restless legs syndrome)   . Tobacco abuse   . Vitamin D deficiency   . Wears dentures    lower  . Wears partial dentures    upper    Past Surgical History:  Procedure Laterality Date  . ABDOMINAL HYSTERECTOMY    . APPENDECTOMY  2000  . BREAST BIOPSY Left   . BREAST RECONSTRUCTION WITH PLACEMENT OF TISSUE EXPANDER AND FLEX HD (ACELLULAR HYDRATED DERMIS) Bilateral 06/02/2015   Procedure: BILATERAL BREAST RECONSTRUCTION WITH PLACEMENT OF TISSUE EXPANDER AND  ACELLULAR HYDRATED DERMIS;  Surgeon: Irene Limbo, Anne [name];  Location: [place];  Service: Plastics;  Laterality: Bilateral;  . CARPAL TUNNEL RELEASE Bilateral   . CATARACT EXTRACTION Bilateral 09/2013  . KNEE ARTHROSCOPY Left   . 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 Perforation of small intestion requiring repair.   Marland Kitchen LOOP RECORDER INSERTION N/A 05/11/2016   Procedure: Loop Recorder Insertion;  Surgeon: Will Meredith Leeds, Anne [name];  Location: [facility];  Service: Cardiovascular;  Laterality: N/A;  . MASTECTOMY W/ SENTINEL NODE BIOPSY Bilateral 06/02/2015   Procedure: BILATERAL MASTECTOMY WITH LEFT SENTINEL LYMPH NODE BIOPSY;  Surgeon: Stark Klein, Anne [name];  Location: [place];  Service: General;  Laterality: Bilateral;  . NASAL SEPTUM SURGERY     x 3  . PLANTAR FASCIA SURGERY  Left   . REMOVAL OF BILATERAL TISSUE EXPANDERS WITH PLACEMENT OF BILATERAL BREAST IMPLANTS Bilateral 06/17/2015   Procedure: DEBRIDEMENT OF BILATERAL MASTECTOMY FLAP WITH BILATERAL TISSUE EXPANDER EXCHANGE ;  Surgeon: Irene Limbo, Anne [name];  Location: [place];  Service: Plastics;  Laterality: Bilateral;  . REMOVAL OF TISSUE EXPANDER Bilateral 07/12/2015   Procedure: REMOVAL OF BILATERAL TISSUE EXPANDERS;  Surgeon: Irene Limbo, Anne [name];   Location: [place];  Service: Plastics;  Laterality: Bilateral;    Family History  Problem Relation Age of Onset  . Cervical cancer Mother 56  . Colon cancer Maternal Grandfather        mets to stomach; dx. 67-70  . Heart disease Father   . Prostate cancer Father 46  . Cervical cancer Maternal Grandmother        dx. 15s; treated with radium implant  . Lung cancer Sister 8       maternal half-sister dx. lung cancer, stage III  . Breast cancer Maternal Aunt   . Cervical cancer Sister 71       paternal half-sister; s/p TAH  . Spina bifida Grandchild   . Brain cancer Grandchild        grandson dx. at 20 mos, treated at [facility]  . Breast cancer Other 68       maternal half-sister's daughter  . Cancer Maternal Aunt        d. 9; unspecified type - "began at back area and moved to vital organs"  . Cancer Maternal Uncle         late 81s; unspecified type; "started at back and moved to vital organs"    Social History   Socioeconomic History  . Marital status: Widowed    Spouse name: Not on file  . Number of children: 1  . Years of education: 53  . Highest education level: Not on file  Occupational History    Comment: [employer], retired  Tobacco Use  . Smoking status: Light Tobacco Smoker    Packs/day: 0.25    Types: Cigarettes    Last attempt to quit: 05/04/2015    Years since quitting: 4.5  . Smokeless tobacco: Never Used  Substance and Sexual Activity  . Alcohol use: Yes    Alcohol/week: 0.0 standard drinks    Comment: rarely  . Drug use: No  . Sexual activity: Not on file  Other Topics Concern  . Not on file  Social History Narrative   Lives alone, widow   Caffeine use- tea, 1 cup daily   Social Determinants of Health   Financial Resource Strain:   . Difficulty of Paying Living Expenses: Not on file  Food Insecurity:   . Worried About Charity fundraiser in the Last Year: Not on file  . Ran Out of Food in the Last Year: Not on file    Transportation Needs:   . Lack of Transportation (Medical): Not on file  . Lack of Transportation (Non-Medical): Not on file  Physical Activity:   . Days of Exercise per Week: Not on file  . Minutes of Exercise per Session: Not on file  Stress:   . Feeling of Stress : Not on file  Social Connections:   . Frequency of Communication with Friends and Family: Not on file  . Frequency of Social Gatherings with Friends and Family: Not on file  . Attends Religious Services: Not on file  . Active Member of Clubs or Organizations: Not on file  . Attends Club  or Organization Meetings: Not on file  . Marital Status: Not on file  Intimate Partner Violence:   . Fear of Current or Ex-Partner: Not on file  . Emotionally Abused: Not on file  . Physically Abused: Not on file  . Sexually Abused: Not on file    Outpatient Medications Prior to Visit  Medication Sig Dispense Refill  . albuterol (VENTOLIN HFA) 108 (90 Base) MCG/ACT inhaler Inhale 1-2 puffs into the lungs every 6 (six) hours as needed for wheezing or shortness of breath. 18 g 0  . amitriptyline (ELAVIL) 75 MG tablet TAKE 1 TABLET EVERY DAY 90 tablet 1  . atorvastatin (LIPITOR) 20 MG tablet Take 1 tablet (20 mg total) by mouth daily. 90 tablet 0  . cetirizine (ZYRTEC) 10 MG tablet Take 1 tablet (10 mg total) by mouth daily. 30 tablet 11  . Cholecalciferol (VITAMIN D3) 50000 UNITS CAPS Take 50,000 Units by mouth once a week.     . fenofibrate 160 MG tablet Take 1 tablet (160 mg total) by mouth daily. 90 tablet 0  . fluorometholone (FML) 0.1 % ophthalmic suspension     . furosemide (LASIX) 20 MG tablet Take 20 mg by mouth daily as needed for edema (for leg swelling).     . gabapentin (NEURONTIN) 300 MG capsule TAKE 1 CAPSULE BY MOUTH THREE TIMES DAILY 90 capsule 3  . HYDROcodone-acetaminophen (NORCO/VICODIN) 5-325 MG tablet Take 1 tablet by mouth 2 (two) times daily as needed for severe pain. 60 tablet 0  . linaclotide (LINZESS) 72 MCG  capsule Take 1 capsule (72 mcg total) by mouth daily before breakfast. 30 capsule 3  . methocarbamol (ROBAXIN) 500 MG tablet Take 500 mg by mouth.    . mirtazapine (REMERON) 45 MG tablet TAKE 1 TABLET BY MOUTH AT BEDTIME 30 tablet 2  . potassium chloride SA (KLOR-CON) 20 MEQ tablet TAKE 1 TABLET BY MOUTH ONCE DAILY WITH FOOD    . primidone (MYSOLINE) 50 MG tablet TAKE 1 TABLET BY MOUTH IN THE MORNING AND 1 AT BEDTIME 60 tablet 0  . promethazine (PHENERGAN) 25 MG tablet Take 1 tablet by mouth as needed for nausea/vomiting.    . RESTASIS 0.05 % ophthalmic emulsion     . SUMAtriptan (IMITREX) 100 MG tablet Take 1 tablet (100 mg total) by mouth daily as needed for migraine (100 mg and then may repeat in 2 hours if headache persists). 10 tablet 2  . tamoxifen (NOLVADEX) 10 MG tablet     . temazepam (RESTORIL) 15 MG capsule TAKE 1 CAPSULE BY MOUTH ONCE DAILY AT BEDTIME AS NEEDED 90 capsule 0  . tiZANidine (ZANAFLEX) 4 MG tablet TAKE 1 TABLET BY MOUTH EVERY 8 HOURS AS NEEDED NOT  TO  EXCEED  3  DOSES  IN  24  HOURS 120 tablet 0  . traMADol (ULTRAM) 50 MG tablet TAKE 1 TABLET BY MOUTH EVERY 12 HOURS AS NEEDED FOR SEVERE ABDOMINAL PAIN 60 tablet 0  . traZODone (DESYREL) 100 MG tablet TAKE 1 TABLET (100 MG TOTAL) BY MOUTH AT BEDTIME AS NEEDED FOR SLEEP. 90 tablet 0  . venlafaxine XR (EFFEXOR-XR) 75 MG 24 hr capsule Take 3 capsules (225 mg total) by mouth daily. 270 capsule 0  . Vitamin D, Ergocalciferol, (DRISDOL) 1.25 MG (50000 UNIT) CAPS capsule     . clotrimazole (MYCELEX) 10 MG troche Take 1 tablet (10 mg total) by mouth 5 (five) times daily. 50 Troche 0  . doxycycline (DORYX) 100 MG EC tablet Take 100  mg by mouth.    . Fluticasone-Umeclidin-Vilant 100-62.5-25 MCG/INH AEPB Inhale into the lungs.    . predniSONE (DELTASONE) 20 MG tablet Take 20 mg by mouth daily with breakfast.     Facility-Administered Medications Prior to Visit  Medication Dose Route Frequency Provider Last Rate Last Admin  .  ipratropium-albuterol (DUONEB) 0.5-2.5 (3) MG/3ML nebulizer solution 3 mL  3 mL Nebulization Once Tristina Sahagian, Anne [name]       .med Allergies:   Rocephin [ceftriaxone sodium in dextrose], Clindamycin/lincomycin, and Lyrica [pregabalin]   Social History   Tobacco Use  . Smoking status: Light Tobacco Smoker    Packs/day: 0.25    Types: Cigarettes    Last attempt to quit: 05/04/2015    Years since quitting: 4.5  . Smokeless tobacco: Never Used  Substance Use Topics  . Alcohol use: Yes    Alcohol/week: 0.0 standard drinks    Comment: rarely  . Drug use: No     Review of Systems  Constitutional: Negative for chills and fever.  HENT: Positive for congestion and sore throat. Negative for ear pain.   Respiratory: Positive for cough and shortness of breath.   Cardiovascular: Negative for chest pain.     Labs/Other Tests and Data Reviewed:    Recent Labs: 08/28/2019: ALT 15; BUN 13; Creatinine, Ser 0.71; Hemoglobin 12.5; Platelets 219; Potassium 3.7; Sodium 139; TSH 0.672   Recent Lipid Panel No results found for: CHOL, TRIG, HDL, CHOLHDL, LDLCALC, LDLDIRECT  Wt Readings from Last 3 Encounters:  10/30/19 94 lb 6.4 oz (42.8 kg)  10/27/19 96 lb 9.6 oz (43.8 kg)  09/25/19 (!) 89 lb 3.2 oz (40.5 kg)     Objective:    Vital Signs:  There were no vitals taken for this visit.   Physical Exam   ASSESSMENT & PLAN:   1. COPD exacerbation (HCC) Increase trelegy to 200 daily  Longer prednisone taper Levaquin.  - Fluticasone-Umeclidin-Vilant (TRELEGY ELLIPTA) 200-62.5-25 MCG/INH AEPB; Inhale 1 puff into the lungs daily.  Dispense: 1 each; Refill: 2 - levofloxacin (LEVAQUIN) 750 MG tablet; Take 1 tablet (750 mg total) by mouth daily.  Dispense: 7 tablet; Refill: 0 - predniSONE (DELTASONE) 20 MG tablet; Take 3 tablets (60 mg total) by mouth daily with breakfast for 3 days, THEN 2 tablets (40 mg total) daily with breakfast for 3 days, THEN 1 tablet (20 mg total) daily with breakfast for 3 days.   Dispense: 18 tablet; Refill: 0  2. Thrush - clotrimazole (MYCELEX) 10 MG troche; Take 1 tablet (10 mg total) by mouth 5 (five) times daily.  Dispense: 50 Troche; Refill: 1      Meds ordered this encounter  Medications  . Fluticasone-Umeclidin-Vilant (TRELEGY ELLIPTA) 200-62.5-25 MCG/INH AEPB    Sig: Inhale 1 puff into the lungs daily.    Dispense:  1 each    Refill:  2  . levofloxacin (LEVAQUIN) 750 MG tablet    Sig: Take 1 tablet (750 mg total) by mouth daily.    Dispense:  7 tablet    Refill:  0  . predniSONE (DELTASONE) 20 MG tablet    Sig: Take 3 tablets (60 mg total) by mouth daily with breakfast for 3 days, THEN 2 tablets (40 mg total) daily with breakfast for 3 days, THEN 1 tablet (20 mg total) daily with breakfast for 3 days.    Dispense:  18 tablet    Refill:  0  . clotrimazole (MYCELEX) 10 MG troche    Sig: Take 1 tablet (  10 mg total) by mouth 5 (five) times daily.    Dispense:  50 Troche    Refill:  1    COVID-19 Education: The signs and symptoms of COVID-19 were discussed with the patient and how to seek care for testing (follow up with PCP or arrange E-visit). The importance of social distancing was discussed today.  Time:   Today, I have spent 10 minutes with the patient with telehealth technology discussing the above problems.    Follow Up:  In Person prn If worsening symptoms. SignedRochel [name], Anne [name]  12/03/2019 7:52 AM    [place]

## 2019-12-04 ENCOUNTER — Other Ambulatory Visit: Payer: Self-pay | Admitting: Family Medicine

## 2019-12-04 DIAGNOSIS — J441 Chronic obstructive pulmonary disease with (acute) exacerbation: Secondary | ICD-10-CM

## 2019-12-04 MED ORDER — LEVOFLOXACIN 750 MG PO TABS: 750.0000 mg | ORAL_TABLET | Freq: Every day | ORAL | 0 refills | Status: DC

## 2019-12-07 ENCOUNTER — Other Ambulatory Visit: Payer: Self-pay | Admitting: Family Medicine

## 2019-12-07 DIAGNOSIS — J441 Chronic obstructive pulmonary disease with (acute) exacerbation: Secondary | ICD-10-CM

## 2019-12-07 DIAGNOSIS — B370 Candidal stomatitis: Secondary | ICD-10-CM

## 2019-12-07 MED ORDER — CLOTRIMAZOLE 10 MG MT TROC: 10.0000 mg | Freq: Every day | OROMUCOSAL | 1 refills | Status: DC

## 2019-12-07 MED ORDER — LEVOFLOXACIN 750 MG PO TABS: 750.0000 mg | ORAL_TABLET | Freq: Every day | ORAL | 0 refills | Status: DC

## 2019-12-08 ENCOUNTER — Ambulatory Visit (INDEPENDENT_AMBULATORY_CARE_PROVIDER_SITE_OTHER): Payer: BC Managed Care – PPO | Admitting: Family Medicine

## 2019-12-08 ENCOUNTER — Encounter: Payer: Self-pay | Admitting: Family Medicine

## 2019-12-08 VITALS — BP 132/72 | HR 94 | Temp 98.6°F | Ht 61.0 in | Wt 94.0 lb

## 2019-12-08 DIAGNOSIS — R509 Fever, unspecified: Secondary | ICD-10-CM

## 2019-12-08 DIAGNOSIS — R051 Acute cough: Secondary | ICD-10-CM

## 2019-12-08 DIAGNOSIS — R059 Cough, unspecified: Secondary | ICD-10-CM | POA: Insufficient documentation

## 2019-12-08 LAB — POCT URINALYSIS DIP (CLINITEK)
Bilirubin, UA: NEGATIVE
Blood, UA: NEGATIVE
Glucose, UA: NEGATIVE mg/dL
Ketones, POC UA: NEGATIVE mg/dL
Leukocytes, UA: NEGATIVE
Nitrite, UA: NEGATIVE
POC PROTEIN,UA: NEGATIVE
Spec Grav, UA: >=1.03 — AB (ref 1.010–1.025)
Urobilinogen, UA: NEGATIVE E.U./dL — AB
pH, UA: 6 (ref 5.0–8.0)

## 2019-12-08 LAB — POC COVID19 BINAXNOW: SARS Coronavirus 2 Ag: NEGATIVE

## 2019-12-08 LAB — POCT RAPID INFLUENZA A&B: Influenza Virus A and B Ag: NEGATIVE

## 2019-12-08 MED ORDER — OMEPRAZOLE 40 MG PO CPDR: 40.0000 mg | DELAYED_RELEASE_CAPSULE | Freq: Every day | ORAL | 3 refills | Status: DC

## 2019-12-08 MED ORDER — BENZONATATE 200 MG PO CAPS: 200.0000 mg | ORAL_CAPSULE | Freq: Two times a day (BID) | ORAL | 0 refills | Status: DC | PRN

## 2019-12-08 NOTE — Progress Notes (Signed)
Acute Office Visit  Subjective:    Patient ID: Anne [name], female    DOB: 05/31/1945, 74 y.o.   MRN: [id number]  Chief Complaint  Patient presents with  . Fatigue    Cough/fever    HPI Patient is in today for elevated temp 102 over the weekend, 101 last night.  No ear pain or pressure. Headaches-long term worsening. Pt completed antibiotics-last week-Levaquin 5 days, 5 days prednisone-1 week ago. Dr. Tobie Poet -sent additional 7 days of Levaquin and prednisone-completed all antibiotics. Prednisone -2 pills left.  No pulmonary doctor.  No labwork at [facility]. Chest xray-no infection per pt.pt continues to smoke 1/2 pack /day -down from 1 pk/day . Dayquil/Nyquil. Pt taking singulair but no allergy medication-  Past Medical History:  Diagnosis Date  . Asthma    daily inhalers  . Breast cancer ([place]) 05/2015   left  . COPD (chronic obstructive pulmonary disease) ([place])    denies SOB with ADLs; no O2  . Dental crowns present   . Depression   . History of concussion 01/01/2014  . History of kidney stones   . History of TIA (transient ischemic attack) 10/2014  . Hyperlipidemia   . Insomnia   . Migraine   . Nasal congestion 05/26/2015   will finish z-pak 05/27/2015  . Nonproductive cough 05/26/2015  . PONV (postoperative nausea and vomiting)    "long time ago"  none recently  . RLS (restless legs syndrome)   . Tobacco abuse   . Vitamin D deficiency   . Wears dentures    lower  . Wears partial dentures    upper    Past Surgical History:  Procedure Laterality Date  . ABDOMINAL HYSTERECTOMY    . APPENDECTOMY  2000  . BREAST BIOPSY Left   . BREAST RECONSTRUCTION WITH PLACEMENT OF TISSUE EXPANDER AND FLEX HD (ACELLULAR HYDRATED DERMIS) Bilateral 06/02/2015   Procedure: BILATERAL BREAST RECONSTRUCTION WITH PLACEMENT OF TISSUE EXPANDER AND  ACELLULAR HYDRATED DERMIS;  Surgeon: Irene Limbo, MD;  Location: [place];  Service: Plastics;  Laterality: Bilateral;  . CARPAL  TUNNEL RELEASE Bilateral   . CATARACT EXTRACTION Bilateral 09/2013  . KNEE ARTHROSCOPY Left   . 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 Perforation of small intestion requiring repair.   Marland Kitchen LOOP RECORDER INSERTION N/A 05/11/2016   Procedure: Loop Recorder Insertion;  Surgeon: Will Meredith Leeds, MD;  Location: [facility];  Service: Cardiovascular;  Laterality: N/A;  . MASTECTOMY W/ SENTINEL NODE BIOPSY Bilateral 06/02/2015   Procedure: BILATERAL MASTECTOMY WITH LEFT SENTINEL LYMPH NODE BIOPSY;  Surgeon: Stark Klein, MD;  Location: [facility];  Service: General;  Laterality: Bilateral;  . NASAL SEPTUM SURGERY     x 3  . PLANTAR FASCIA SURGERY Left   . REMOVAL OF BILATERAL TISSUE EXPANDERS WITH PLACEMENT OF BILATERAL BREAST IMPLANTS Bilateral 06/17/2015   Procedure: DEBRIDEMENT OF BILATERAL MASTECTOMY FLAP WITH BILATERAL TISSUE EXPANDER EXCHANGE ;  Surgeon: Irene Limbo, MD;  Location: [place];  Service: Plastics;  Laterality: Bilateral;  . REMOVAL OF TISSUE EXPANDER Bilateral 07/12/2015   Procedure: REMOVAL OF BILATERAL TISSUE EXPANDERS;  Surgeon: Irene Limbo, MD;  Location: [place];  Service: Plastics;  Laterality: Bilateral;    Family History  Problem Relation Age of Onset  . Cervical cancer Mother 44  . Colon cancer Maternal Grandfather        mets to stomach; dx. 67-70  .  Heart disease Father   . Prostate cancer Father 44  . Cervical cancer Maternal Grandmother        dx. 79s; treated with radium implant  . Lung cancer Sister 10       maternal half-sister dx. lung cancer, stage III  . Breast cancer Maternal Aunt   . Cervical cancer Sister 39       paternal half-sister; s/p TAH  . Spina bifida Grandchild   . Brain cancer Grandchild        grandson dx. at 20 mos, treated at [facility] [place]  . Breast cancer Other 71       maternal half-sister's  daughter  . Cancer Maternal Aunt        d. 51; unspecified type - "began at back area and moved to vital organs"  . Cancer Maternal Uncle         late 68s; unspecified type; "started at back and moved to vital organs"    Social History   Socioeconomic History  . Marital status: Widowed    Spouse name: Not on file  . Number of children: 1  . Years of education: 76  . Highest education level: Not on file  Occupational History    Comment: [employer], retired  Tobacco Use  . Smoking status: Light Tobacco Smoker    Packs/day: 0.25    Types: Cigarettes    Last attempt to quit: 05/04/2015    Years since quitting: 4.6  . Smokeless tobacco: Never Used  Substance and Sexual Activity  . Alcohol use: Yes    Alcohol/week: 0.0 standard drinks    Comment: rarely  . Drug use: No  . Sexual activity: Not on file  Other Topics Concern  . Not on file  Social History Narrative   Lives alone, widow   Caffeine use- tea, 1 cup daily   Social Determinants of Health   Financial Resource Strain:   . Difficulty of Paying Living Expenses: Not on file  Food Insecurity:   . Worried About Charity fundraiser in the Last Year: Not on file  . Ran Out of Food in the Last Year: Not on file  Transportation Needs:   . Lack of Transportation (Medical): Not on file  . Lack of Transportation (Non-Medical): Not on file  Physical Activity:   . Days of Exercise per Week: Not on file  . Minutes of Exercise per Session: Not on file  Stress:   . Feeling of Stress : Not on file  Social Connections:   . Frequency of Communication with Friends and Family: Not on file  . Frequency of Social Gatherings with Friends and Family: Not on file  . Attends Religious Services: Not on file  . Active Member of Clubs or Organizations: Not on file  . Attends Archivist Meetings: Not on file  . Marital Status: Not on file  Intimate Partner Violence:   . Fear of Current or Ex-Partner: Not on file  .  Emotionally Abused: Not on file  . Physically Abused: Not on file  . Sexually Abused: Not on file    Outpatient Medications Prior to Visit  Medication Sig Dispense Refill  . amitriptyline (ELAVIL) 75 MG tablet TAKE 1 TABLET EVERY DAY 90 tablet 1  . atorvastatin (LIPITOR) 20 MG tablet Take 1 tablet (20 mg total) by mouth daily. 90 tablet 0  . cetirizine (ZYRTEC) 10 MG tablet Take 1 tablet (10 mg total) by mouth daily. 30 tablet 11  .  Cholecalciferol (VITAMIN D3) 50000 UNITS CAPS Take 50,000 Units by mouth once a week.     . clotrimazole (MYCELEX) 10 MG troche Take 1 tablet (10 mg total) by mouth 5 (five) times daily. 50 Troche 1  . fenofibrate 160 MG tablet Take 1 tablet (160 mg total) by mouth daily. 90 tablet 0  . fluorometholone (FML) 0.1 % ophthalmic suspension     . Fluticasone-Umeclidin-Vilant (TRELEGY ELLIPTA) 200-62.5-25 MCG/INH AEPB Inhale 1 puff into the lungs daily. 1 each 2  . furosemide (LASIX) 20 MG tablet Take 20 mg by mouth daily as needed for edema (for leg swelling).     . gabapentin (NEURONTIN) 300 MG capsule TAKE 1 CAPSULE BY MOUTH THREE TIMES DAILY 90 capsule 3  . HYDROcodone-acetaminophen (NORCO/VICODIN) 5-325 MG tablet Take 1 tablet by mouth 2 (two) times daily as needed for severe pain. 60 tablet 0  . linaclotide (LINZESS) 72 MCG capsule Take 1 capsule (72 mcg total) by mouth daily before breakfast. 30 capsule 3  . methocarbamol (ROBAXIN) 500 MG tablet Take 500 mg by mouth.    . mirtazapine (REMERON) 45 MG tablet TAKE 1 TABLET BY MOUTH AT BEDTIME 30 tablet 2  . potassium chloride SA (KLOR-CON) 20 MEQ tablet TAKE 1 TABLET BY MOUTH ONCE DAILY WITH FOOD    . predniSONE (DELTASONE) 20 MG tablet Take 3 tablets (60 mg total) by mouth daily with breakfast for 3 days, THEN 2 tablets (40 mg total) daily with breakfast for 3 days, THEN 1 tablet (20 mg total) daily with breakfast for 3 days. 18 tablet 0  . primidone (MYSOLINE) 50 MG tablet TAKE 1 TABLET BY MOUTH IN THE MORNING AND  1 AT BEDTIME 60 tablet 0  . promethazine (PHENERGAN) 25 MG tablet Take 1 tablet by mouth as needed for nausea/vomiting.    . RESTASIS 0.05 % ophthalmic emulsion     . SUMAtriptan (IMITREX) 100 MG tablet Take 1 tablet (100 mg total) by mouth daily as needed for migraine (100 mg and then may repeat in 2 hours if headache persists). 10 tablet 2  . tamoxifen (NOLVADEX) 10 MG tablet     . temazepam (RESTORIL) 15 MG capsule TAKE 1 CAPSULE BY MOUTH ONCE DAILY AT BEDTIME AS NEEDED 90 capsule 0  . tiZANidine (ZANAFLEX) 4 MG tablet TAKE 1 TABLET BY MOUTH EVERY 6 HOURS AS NEEDED DO  NOT  EXCEED  3  DOSES  IN  24  HOURS 120 tablet 0  . traMADol (ULTRAM) 50 MG tablet TAKE 1 TABLET BY MOUTH EVERY 12 HOURS AS NEEDED FOR SEVERE ABDOMINAL PAIN 60 tablet 0  . traZODone (DESYREL) 100 MG tablet TAKE 1 TABLET (100 MG TOTAL) BY MOUTH AT BEDTIME AS NEEDED FOR SLEEP. 90 tablet 0  . venlafaxine XR (EFFEXOR-XR) 75 MG 24 hr capsule Take 3 capsules (225 mg total) by mouth daily. 270 capsule 0  . Vitamin D, Ergocalciferol, (DRISDOL) 1.25 MG (50000 UNIT) CAPS capsule     . albuterol (VENTOLIN HFA) 108 (90 Base) MCG/ACT inhaler Inhale 1-2 puffs into the lungs every 6 (six) hours as needed for wheezing or shortness of breath. 18 g 0  . levofloxacin (LEVAQUIN) 750 MG tablet Take 1 tablet (750 mg total) by mouth daily. 7 tablet 0   Facility-Administered Medications Prior to Visit  Medication Dose Route Frequency Provider Last Rate Last Admin  . ipratropium-albuterol (DUONEB) 0.5-2.5 (3) MG/3ML nebulizer solution 3 mL  3 mL Nebulization Once [name], MD  Allergies  Allergen Reactions  . Rocephin [Ceftriaxone Sodium In Dextrose] Shortness Of Breath and Rash  . Clindamycin/Lincomycin Nausea And Vomiting  . Lyrica [Pregabalin] Other (See Comments)    PERSONALITY CHANGE    Review of Systems  Constitutional: Positive for fatigue and fever.       Flu shot at [place]: Positive for congestion, sore throat and trouble  swallowing. Negative for ear pain, facial swelling, sinus pressure, sinus pain and voice change.   Eyes:       Glasses utd  Respiratory: Positive for chest tightness, shortness of breath and wheezing.        COPD Albuterol 3nebulizer Trelegy daily No recent pneumonia  Cardiovascular: Negative for chest pain.  Gastrointestinal: Positive for abdominal pain.       Gallbladder removed [id number] complication in small and large intestine. Small intestines re-routed  Food intake limited in last 3 days  Genitourinary: Negative for decreased urine volume, frequency, hematuria and urgency.       Kidney stone-extraction No blood in urine  Neurological: Positive for dizziness, tremors, weakness, light-headedness and headaches. Negative for seizures and syncope.       Headaches 10/10-upon waking-migraines long term-worse lately-last dose tylenol 11pm-no meds for temp elevation today. Forehead headache today  Hematological: Negative for adenopathy. Does not bruise/bleed easily.       Objective:    Physical Exam Constitutional:      Appearance: Normal appearance.  HENT:     Head: Normocephalic and atraumatic.     Right Ear: Tympanic membrane, ear canal and external ear normal.     Left Ear: Tympanic membrane, ear canal and external ear normal.  Eyes:     Conjunctiva/sclera: Conjunctivae normal.  Cardiovascular:     Rate and Rhythm: Normal rate and regular rhythm.     Pulses: Normal pulses.     Heart sounds: Normal heart sounds.  Pulmonary:     Effort: Pulmonary effort is normal.     Breath sounds: Normal breath sounds.  Musculoskeletal:     Cervical back: Normal range of motion and neck supple.  Neurological:     Mental Status: She is alert and oriented to person, place, and time.     BP 132/72 (BP Location: Right Arm, Patient Position: Sitting)   Pulse 94   Temp 98.6 F (37 C) (Temporal)   Ht _0  (1.549 m)   Wt 94 lb (42.6 kg)   SpO2 96%   BMI 17.76 kg/m    Wt Readings from Last 3 Encounters:  12/08/19 94 lb (42.6 kg)  10/30/19 94 lb 6.4 oz (42.8 kg)  10/27/19 96 lb 9.6 oz (43.8 kg)    Health Maintenance Due  Topic Date Due  . Hepatitis C Screening  Never done  . COVID-19 Vaccine (1) Never done  . MAMMOGRAM  Never done  . DEXA SCAN  Never done  . INFLUENZA VACCINE  10/04/2019    Lab Results  Component Value Date   TSH 0.672 08/28/2019   Lab Results  Component Value Date   WBC 7.0 08/28/2019   HGB 12.5 08/28/2019   HCT 39.5 08/28/2019   MCV 94 08/28/2019   PLT 219 08/28/2019   Lab Results  Component Value Date   NA 139 08/28/2019   K 3.7 08/28/2019   CO2 26 08/28/2019   GLUCOSE 114 (H) 08/28/2019   BUN 13 08/28/2019   CREATININE 0.71 08/28/2019   BILITOT 0.3 08/28/2019   ALKPHOS 79 08/28/2019   AST 19  08/28/2019   ALT 15 08/28/2019   PROT 6.0 08/28/2019   ALBUMIN 3.6 (L) 08/28/2019   CALCIUM 8.9 08/28/2019   ANIONGAP 3 (L) 07/03/2016  40 minutes-screening tests, history, physical exam, assessment and discussion of plan     Assessment & Plan:   1. Acute cough Rapid negative, flu negative-COPD exacerbation-has not cleared-higher doses of prednisone improved cough. Levaquin 12 days - Novel Coronavirus, NAA ([facility]) - POC COVID-19 - POCT Rapid Influenza A&B Weight loss-concern for SOB/cough causing pt not to eat appropriately-Boost, Ensure  2. Fever, unspecified fever cause Pt must have negative COVID to return to [place] works in the school as a Retail buyer.  - POCT URINALYSIS DIP (CLINITEK) - CBC w/Diff/Platelet - CMP14+EGFR  Macario Shear Hannah Beat, MD

## 2019-12-08 NOTE — Patient Instructions (Addendum)
Work note for 10/5-10/10-RTC 10/11 labwork

## 2019-12-09 ENCOUNTER — Other Ambulatory Visit: Payer: Self-pay | Admitting: Family Medicine

## 2019-12-09 LAB — CMP14+EGFR
ALT: 11 IU/L (ref 0–32)
AST: 21 IU/L (ref 0–40)
Albumin/Globulin Ratio: 1.9 (ref 1.2–2.2)
Albumin: 3.7 g/dL (ref 3.7–4.7)
Alkaline Phosphatase: 70 IU/L (ref 44–121)
BUN/Creatinine Ratio: 21 (ref 12–28)
BUN: 19 mg/dL (ref 8–27)
Bilirubin Total: 0.2 mg/dL (ref 0.0–1.2)
CO2: 25 mmol/L (ref 20–29)
Calcium: 8.6 mg/dL — ABNORMAL LOW (ref 8.7–10.3)
Chloride: 108 mmol/L — ABNORMAL HIGH (ref 96–106)
Creatinine, Ser: 0.9 mg/dL (ref 0.57–1.00)
GFR calc Af Amer: 73 mL/min/{1.73_m2} (ref >59)
GFR calc non Af Amer: 64 mL/min/{1.73_m2} (ref >59)
Globulin, Total: 2 g/dL (ref 1.5–4.5)
Glucose: 86 mg/dL (ref 65–99)
Potassium: 3.8 mmol/L (ref 3.5–5.2)
Sodium: 142 mmol/L (ref 134–144)
Total Protein: 5.7 g/dL — ABNORMAL LOW (ref 6.0–8.5)

## 2019-12-09 LAB — CBC WITH DIFFERENTIAL/PLATELET
Basophils Absolute: 0 10*3/uL (ref 0.0–0.2)
Basos: 1 %
EOS (ABSOLUTE): 0.1 10*3/uL (ref 0.0–0.4)
Eos: 1 %
Hematocrit: 34.3 % (ref 34.0–46.6)
Hemoglobin: 11.1 g/dL (ref 11.1–15.9)
Immature Grans (Abs): 0 10*3/uL (ref 0.0–0.1)
Immature Granulocytes: 1 %
Lymphocytes Absolute: 2.3 10*3/uL (ref 0.7–3.1)
Lymphs: 26 %
MCH: 29.9 pg (ref 26.6–33.0)
MCHC: 32.4 g/dL (ref 31.5–35.7)
MCV: 93 fL (ref 79–97)
Monocytes Absolute: 0.8 10*3/uL (ref 0.1–0.9)
Monocytes: 9 %
Neutrophils Absolute: 5.5 10*3/uL (ref 1.4–7.0)
Neutrophils: 62 %
Platelets: 205 10*3/uL (ref 150–450)
RBC: 3.71 x10E6/uL — ABNORMAL LOW (ref 3.77–5.28)
RDW: 12.8 % (ref 11.7–15.4)
WBC: 8.7 10*3/uL (ref 3.4–10.8)

## 2019-12-10 LAB — NOVEL CORONAVIRUS, NAA: SARS-CoV-2, NAA: NOT DETECTED

## 2019-12-10 LAB — SARS-COV-2, NAA 2 DAY TAT

## 2019-12-17 ENCOUNTER — Other Ambulatory Visit: Payer: Self-pay | Admitting: Family Medicine

## 2019-12-17 DIAGNOSIS — M5416 Radiculopathy, lumbar region: Secondary | ICD-10-CM

## 2019-12-17 MED ORDER — HYDROCODONE-ACETAMINOPHEN 5-325 MG PO TABS: 1.0000 | ORAL_TABLET | Freq: Two times a day (BID) | ORAL | 0 refills | Status: DC | PRN

## 2020-01-04 ENCOUNTER — Encounter: Payer: Self-pay | Admitting: Family Medicine

## 2020-01-11 ENCOUNTER — Other Ambulatory Visit: Payer: Self-pay | Admitting: Hematology and Oncology

## 2020-01-13 ENCOUNTER — Other Ambulatory Visit: Payer: Self-pay

## 2020-01-13 MED ORDER — ALBUTEROL SULFATE HFA 108 (90 BASE) MCG/ACT IN AERS: 1.0000 | INHALATION_SPRAY | Freq: Four times a day (QID) | RESPIRATORY_TRACT | 0 refills | Status: DC | PRN

## 2020-01-13 NOTE — Progress Notes (Signed)
01/14/20- 10 yoF smoker referred for sleep evaluation with concern of chronic insomnia, courtesy of Dr Dirk Dress. Medical problem list includes Migraine, Carotid Artery Disease, Chronic Bronchitis/ COPD, Allergic Rhinitis, Tobacco Abuse, Restless Legs, Weight Loss, hx L Breast Cancer, R Lumbar Radiculopathy,  Meds include amitriptyline 75, Gabapentin 300 tid, Remeron 45, Temazepam 15, Zanaflex,  Norco,  Trazodone 100 hs prn sleep,  Trelegy 200, Ventolin hfa, benzonatate, Neb Duoneb(rarely used) Epworth score 2 Body weight today- 95 lbs Covid vax- 2 Moderna Flu vax- had Sleep-Her concern is difficulty initiating and maintaining sleep over the past year. Some nights 0-2-4 hours sleep. Says trazodone 150 mg had worked pretty well, but Dr Tobie Poet wanted her off it. Doesn't remember adverse effect. Above med list discussed. Doesn't remember other prescribed meds for sleep, insisting nothing otc helped.  Denies recognized reason for onset of insomnia. Widowed x 7 years, living alone next door to son's family but apparently not socially close.  Has lost 45 lbs in 2 years-"husband did the cooking. I just don't enjoy eating." Denies thyroid hx. Denies parasomnias. 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 Physically comfortable in bed.  Other- COPD- This is why she thought she was coming here and she would like our help. Active cough, scant phlegm worse since ran out of benzonatate. No blood. Tussive headaches. Had CXR and PFT this year at [facility].  Samples Breztri no better than Trelegy. Occ albuterol rescue inhaler, rarely uses neb. No O2.   Prior to Admission medications   Medication Sig Start Date End Date Taking? Authorizing Provider  albuterol (VENTOLIN HFA) 108 (90 Base) MCG/ACT inhaler Inhale 1-2 puffs into the lungs every 6 (six) hours as needed for wheezing or shortness of breath. 01/13/20 04/12/20 Yes Cox, Kirsten, MD  amitriptyline (ELAVIL) 75 MG tablet TAKE 1 TABLET EVERY DAY 10/02/19   Yes Cox, Kirsten, MD  atorvastatin (LIPITOR) 20 MG tablet Take 1 tablet (20 mg total) by mouth daily. 08/04/19  Yes Cox, Kirsten, MD  benzonatate (TESSALON) 200 MG capsule Take 1 capsule (200 mg total) by mouth 2 (two) times daily as needed for cough. 12/08/19  Yes Corum, Rex Kras, MD  cetirizine (ZYRTEC) 10 MG tablet Take 1 tablet (10 mg total) by mouth daily. 06/26/19  Yes Cox, Kirsten, MD  Cholecalciferol (VITAMIN D3) 50000 UNITS CAPS Take 50,000 Units by mouth once a week.    Yes [provider]  clotrimazole (MYCELEX) 10 MG troche Take 1 tablet (10 mg total) by mouth 5 (five) times daily. 12/07/19  Yes Cox, Kirsten, MD  fenofibrate 160 MG tablet Take 1 tablet (160 mg total) by mouth daily. 07/21/19  Yes Cox, Kirsten, MD  fluorometholone (FML) 0.1 % ophthalmic suspension  05/15/19  Yes [provider]  Fluticasone-Umeclidin-Vilant (TRELEGY ELLIPTA) 200-62.5-25 MCG/INH AEPB Inhale 1 puff into the lungs daily. 12/03/19  Yes Cox, Kirsten, MD  furosemide (LASIX) 20 MG tablet Take 20 mg by mouth daily as needed for edema (for leg swelling).    Yes [provider]  gabapentin (NEURONTIN) 300 MG capsule TAKE 1 CAPSULE BY MOUTH THREE TIMES DAILY 08/23/19  Yes Cox, Kirsten, MD  HYDROcodone-acetaminophen (NORCO/VICODIN) 5-325 MG tablet Take 1 tablet by mouth 2 (two) times daily as needed for severe pain. 12/17/19  Yes Cox, Kirsten, MD  methocarbamol (ROBAXIN) 500 MG tablet Take 500 mg by mouth. 07/12/19  Yes [provider]  mirtazapine (REMERON) 45 MG tablet TAKE 1 TABLET BY MOUTH AT BEDTIME 08/20/19  Yes Cox, Kirsten,  MD  potassium chloride SA (KLOR-CON) 20 MEQ tablet TAKE 1 TABLET BY MOUTH ONCE DAILY WITH FOOD 01/06/18  Yes [provider]  primidone (MYSOLINE) 50 MG tablet TAKE 1 TABLET BY MOUTH IN THE MORNING AND 1 AT BEDTIME 12/03/19  Yes Cox, Kirsten, MD  promethazine (PHENERGAN) 25 MG tablet Take 1 tablet by mouth as needed for nausea/vomiting. 07/10/16  Yes [provider]  SUMAtriptan (IMITREX) 100 MG tablet TAKE ONE TABLET BY MOUTH AT ONSET OF HEADACHE THEN MAY REPEAT IN 2 HOURS NOT TO EXCEED 2 TABLETS IN 24 HOURS 12/09/19  Yes Cox, Kirsten, MD  tamoxifen (NOLVADEX) 10 MG tablet  05/04/19  Yes [provider]  temazepam (RESTORIL) 15 MG capsule TAKE 1 CAPSULE BY MOUTH ONCE DAILY AT BEDTIME AS NEEDED 07/21/19  Yes Cox, Kirsten, MD  tiZANidine (ZANAFLEX) 4 MG tablet TAKE 1 TABLET BY MOUTH EVERY 6 HOURS AS NEEDED DO  NOT  EXCEED  3  DOSES  IN  24  HOURS 12/03/19  Yes Cox, Kirsten, MD  traMADol (ULTRAM) 50 MG tablet TAKE 1 TABLET BY MOUTH EVERY 12 HOURS AS NEEDED FOR  ABDOMINAL  PAIN 12/09/19  Yes Cox, Kirsten, MD  traZODone (DESYREL) 100 MG tablet TAKE 1 TABLET (100 MG TOTAL) BY MOUTH AT BEDTIME AS NEEDED FOR SLEEP. 10/02/19  Yes Cox, Kirsten, MD  Vitamin D, Ergocalciferol, (DRISDOL) 1.25 MG (50000 UNIT) CAPS capsule  08/08/19  Yes [provider]  benzonatate (TESSALON) 200 MG capsule Take 1 capsule (200 mg total) by mouth 3 (three) times daily as needed for cough. 01/14/20   Deneise Lever, MD   Past Medical History:  Diagnosis Date  . Asthma    daily inhalers  . Breast cancer ([place]) 05/2015   left  . COPD (chronic obstructive pulmonary disease) ([place])    denies SOB with ADLs; no O2  . Dental crowns present   . Depression   . History of concussion 01/01/2014  . History of kidney stones   . History of TIA (transient ischemic attack) 10/2014  . Hyperlipidemia   . Insomnia   . Migraine   . Nasal congestion 05/26/2015   will finish z-pak 05/27/2015  . Nonproductive cough 05/26/2015  . PONV (postoperative nausea and vomiting)    "long time ago"  none recently  . RLS (restless legs syndrome)   . Tobacco abuse   . Vitamin D deficiency   . Wears dentures    lower  . Wears partial dentures    upper   Past Surgical History:  Procedure Laterality Date  . ABDOMINAL HYSTERECTOMY    . APPENDECTOMY  2000  . BREAST BIOPSY Left   . BREAST  RECONSTRUCTION WITH PLACEMENT OF TISSUE EXPANDER AND FLEX HD (ACELLULAR HYDRATED DERMIS) Bilateral 06/02/2015   Procedure: BILATERAL BREAST RECONSTRUCTION WITH PLACEMENT OF TISSUE EXPANDER AND  ACELLULAR HYDRATED DERMIS;  Surgeon: Irene Limbo, MD;  Location: [place];  Service: Plastics;  Laterality: Bilateral;  . CARPAL TUNNEL RELEASE Bilateral   . CATARACT EXTRACTION Bilateral 09/2013  . KNEE ARTHROSCOPY Left   . 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 Perforation of small intestion requiring repair.   Marland Kitchen LOOP RECORDER INSERTION N/A 05/11/2016   Procedure: Loop Recorder Insertion;  Surgeon: Will Meredith Leeds, MD;  Location: [place] [facility];  Service: Cardiovascular;  Laterality: N/A;  . MASTECTOMY W/ SENTINEL NODE BIOPSY Bilateral 06/02/2015   Procedure: BILATERAL MASTECTOMY WITH LEFT  SENTINEL LYMPH NODE BIOPSY;  Surgeon: Stark Klein, MD;  Location: [place];  Service: General;  Laterality: Bilateral;  . NASAL SEPTUM SURGERY     x 3  . PLANTAR FASCIA SURGERY Left   . REMOVAL OF BILATERAL TISSUE EXPANDERS WITH PLACEMENT OF BILATERAL BREAST IMPLANTS Bilateral 06/17/2015   Procedure: DEBRIDEMENT OF BILATERAL MASTECTOMY FLAP WITH BILATERAL TISSUE EXPANDER EXCHANGE ;  Surgeon: Irene Limbo, MD;  Location: [place];  Service: Plastics;  Laterality: Bilateral;  . REMOVAL OF TISSUE EXPANDER Bilateral 07/12/2015   Procedure: REMOVAL OF BILATERAL TISSUE EXPANDERS;  Surgeon: Irene Limbo, MD;  Location: [place];  Service: Plastics;  Laterality: Bilateral;   Family History  Problem Relation Age of Onset  . Cervical cancer Mother 73  . Colon cancer Maternal Grandfather        mets to stomach; dx. 67-70  . Heart disease Father   . Prostate cancer Father 63  . Cervical cancer Maternal Grandmother        dx. 20s; treated with radium implant   . Lung cancer Sister 43       maternal half-sister dx. lung cancer, stage III  . Breast cancer Maternal Aunt   . Cervical cancer Sister 78       paternal half-sister; s/p TAH  . Spina bifida Grandchild   . Brain cancer Grandchild        grandson dx. at 20 mos, [facility]  . Breast cancer Other 29       maternal half-sister's daughter  . Cancer Maternal Aunt        d. 62; unspecified type - "began at back area and moved to vital organs"  . Cancer Maternal Uncle         late 6s; unspecified type; "started at back and moved to vital organs"   Social History   Socioeconomic History  . Marital status: Widowed    Spouse name: Not on file  . Number of children: 1  . Years of education: 20  . Highest education level: Not on file  Occupational History    Comment: [employer], retired  Tobacco Use  . Smoking status: Light Tobacco Smoker    Packs/day: 0.25    Types: Cigarettes    Last attempt to quit: 05/04/2015    Years since quitting: 4.7  . Smokeless tobacco: Never Used  Substance and Sexual Activity  . Alcohol use: Yes    Alcohol/week: 0.0 standard drinks    Comment: rarely  . Drug use: No  . Sexual activity: Not on file  Other Topics Concern  . Not on file  Social History Narrative   Lives alone, widow   Caffeine use- tea, 1 cup daily   Social Determinants of Health   Financial Resource Strain:   . Difficulty of Paying Living Expenses: Not on file  Food Insecurity:   . Worried About Charity fundraiser in the Last Year: Not on file  . Ran Out of Food in the Last Year: Not on file  Transportation Needs:   . Lack of Transportation (Medical): Not on file  . Lack of Transportation (Non-Medical): Not on file  Physical Activity:   . Days of Exercise per Week: Not on file  . Minutes of Exercise per Session: Not on file  Stress:   . Feeling of Stress : Not on file  Social Connections:   . Frequency of Communication with Friends and Family: Not on file  .  Frequency of Social Gatherings with Friends and Family: Not on file  . Attends Religious Services: Not on file  . Active Member of Clubs or Organizations: Not on file  . Attends Archivist Meetings: Not on file  . Marital Status: Not on file  Intimate Partner Violence:   . Fear of Current or Ex-Partner: Not on file  . Emotionally Abused: Not on file  . Physically Abused: Not on file  . Sexually Abused: Not on file   ROS-see HPI   + = positive Constitutional:    +weight loss, night sweats, fevers, chills, fatigue, lassitude. HEENT:   + headaches, difficulty swallowing, tooth/dental problems, sore throat,       sneezing, itching, ear ache, +nasal congestion, post nasal drip, snoring CV:    chest pain, orthopnea, PND, +swelling in lower extremities, anasarca,                                  dizziness, palpitations Resp:   +shortness of breath with exertion or at rest.               + productive cough,   non-productive cough, coughing up of blood.              change in color of mucus.  wheezing.   Skin:    rash or lesions. GI:  No-   heartburn, indigestion, +abdominal pain, nausea, vomiting, diarrhea,                 change in bowel habits,+ loss of appetite GU: dysuria, change in color of urine, no urgency or frequency.   flank pain. MS:   joint pain, stiffness, decreased range of motion, back pain. Neuro-     nothing unusual Psych:  change in mood or affect.  depression or anxiety.   memory loss.  OBJ- Physical Exam General- Alert, Oriented, Affect-appropriate, Distress- none acute, +petite/ slender Skin- rash-none, lesions- none, excoriation- none Lymphadenopathy- none Head- atraumatic            Eyes- Gross vision intact, PERRLA, conjunctivae and secretions clear            Ears- Hearing, canals-normal            Nose- Clear, no-Septal dev, mucus, polyps, erosion, perforation             Throat- Mallampati II , mucosa clear , drainage- none, tonsils- atrophic Neck-  flexible , trachea midline, no stridor , thyroid nl, carotid no bruit Chest - symmetrical excursion , unlabored           Heart/CV- RRR , no murmur , no gallop  , no rub, nl s1 s2                           - JVD- none , edema- none, stasis changes- none, varices- none           Lung- clear to P&A/+ diminished, wheeze- none, cough+ active , dullness-none, rub- none           Chest wall- + loop recorder L Abd-  Br/ Gen/ Rectal- Not done, not indicated Extrem- cyanosis- none, clubbing, none, atrophy- none, strength- nl Neuro- grossly intact to observation

## 2020-01-14 ENCOUNTER — Other Ambulatory Visit: Payer: Self-pay

## 2020-01-14 ENCOUNTER — Ambulatory Visit (INDEPENDENT_AMBULATORY_CARE_PROVIDER_SITE_OTHER): Payer: BC Managed Care – PPO | Admitting: Internal Medicine

## 2020-01-14 ENCOUNTER — Encounter: Payer: Self-pay | Admitting: Internal Medicine

## 2020-01-14 VITALS — BP 116/68 | HR 70 | Temp 98.4°F | Ht 61.0 in | Wt 95.2 lb

## 2020-01-14 DIAGNOSIS — G479 Sleep disorder, unspecified: Secondary | ICD-10-CM

## 2020-01-14 DIAGNOSIS — Z720 Tobacco use: Secondary | ICD-10-CM | POA: Diagnosis not present

## 2020-01-14 DIAGNOSIS — J449 Chronic obstructive pulmonary disease, unspecified: Secondary | ICD-10-CM | POA: Diagnosis not present

## 2020-01-14 MED ORDER — BENZONATATE 200 MG PO CAPS: 200.0000 mg | ORAL_CAPSULE | Freq: Three times a day (TID) | ORAL | 5 refills | Status: DC | PRN

## 2020-01-14 NOTE — Patient Instructions (Addendum)
Order- release of records so we can get results of PFT and  CXR from [place]- schedule home sleep test    Dx sleep disorder  Please call us for results of your sleep test about 2 weeks after it is done.  Script sent refilling benzonatate perles for cough  Ok to try otc cough syrups, throat lozenges, Jolly Rancher candies etc for cough as needed  Please call if we can help

## 2020-01-15 ENCOUNTER — Encounter: Payer: Self-pay | Admitting: Internal Medicine

## 2020-01-15 NOTE — Assessment & Plan Note (Signed)
Pressed her to make up her mind to stop smoking. Counseling available if she is willing to try.

## 2020-01-15 NOTE — Assessment & Plan Note (Signed)
Difficulty initiating and maintaining sleep- insomnia. Onset reportedly only in past year. Associated with weight loss over past 2 years. She denies obvious social stress or medication change with that timing.  A component of depression is certainly possible. We discussed Cognitive Behavioral Therapy as a non-pharmacologic option. Plan- baseline sleep study with particular attention to oxygenation during sleep. Then look at options.

## 2020-01-15 NOTE — Assessment & Plan Note (Signed)
She asked for our in-put on this problem. Current meds seem quite appropriate. Smoking cessation re-emphasized. Plan- requesting PFT and CXR results from [facility].

## 2020-01-20 ENCOUNTER — Other Ambulatory Visit: Payer: Self-pay

## 2020-01-20 ENCOUNTER — Encounter: Payer: Self-pay | Admitting: Family Medicine

## 2020-01-20 ENCOUNTER — Other Ambulatory Visit: Payer: Self-pay | Admitting: Family Medicine

## 2020-01-20 ENCOUNTER — Ambulatory Visit (INDEPENDENT_AMBULATORY_CARE_PROVIDER_SITE_OTHER): Payer: BC Managed Care – PPO | Admitting: Family Medicine

## 2020-01-20 VITALS — BP 116/62 | HR 71 | Temp 98.1°F | Ht 61.0 in | Wt 96.6 lb

## 2020-01-20 DIAGNOSIS — Z720 Tobacco use: Secondary | ICD-10-CM | POA: Diagnosis not present

## 2020-01-20 DIAGNOSIS — G479 Sleep disorder, unspecified: Secondary | ICD-10-CM | POA: Diagnosis not present

## 2020-01-20 DIAGNOSIS — R059 Cough, unspecified: Secondary | ICD-10-CM | POA: Diagnosis not present

## 2020-01-20 DIAGNOSIS — J441 Chronic obstructive pulmonary disease with (acute) exacerbation: Secondary | ICD-10-CM | POA: Diagnosis not present

## 2020-01-20 DIAGNOSIS — M5416 Radiculopathy, lumbar region: Secondary | ICD-10-CM

## 2020-01-20 MED ORDER — AZITHROMYCIN 250 MG PO TABS: ORAL_TABLET | ORAL | 0 refills | Status: DC

## 2020-01-20 MED ORDER — HYDROCODONE-ACETAMINOPHEN 5-325 MG PO TABS: 1.0000 | ORAL_TABLET | Freq: Two times a day (BID) | ORAL | 0 refills | Status: DC | PRN

## 2020-01-20 MED ORDER — PREDNISONE 10 MG PO TABS: ORAL_TABLET | ORAL | 0 refills | Status: DC

## 2020-01-20 NOTE — Progress Notes (Signed)
Acute Office Visit  Subjective:    Patient ID: Anne [name], female    DOB: 04/19/1945, 74 y.o.   MRN: [id number]  Chief Complaint  Patient presents with  . Shortness of Breath    Cough    HPI Patient is in today for thick congestion-mucous green No fever today-101 two weeks ago when pt checked her temp Wheezing -worse at night Right ear pain Nasal congestion ongoing Trelegy daily, albuterol weekly Pt states she had pft and cxr at [facility]. Seen by sleep specialist/pulmonary-no changes in medication. Pt to complete sleep study at home Past Medical History:  Diagnosis Date  . Asthma    daily inhalers  . Breast cancer ([place]) 05/2015   left  . COPD (chronic obstructive pulmonary disease) ([place])    denies SOB with ADLs; no O2  . Dental crowns present   . Depression   . History of concussion 01/01/2014  . History of kidney stones   . History of TIA (transient ischemic attack) 10/2014  . Hyperlipidemia   . Insomnia   . Migraine   . Nasal congestion 05/26/2015   will finish z-pak 05/27/2015  . Nonproductive cough 05/26/2015  . PONV (postoperative nausea and vomiting)    "long time ago"  none recently  . RLS (restless legs syndrome)   . Tobacco abuse   . Vitamin D deficiency   . Wears dentures    lower  . Wears partial dentures    upper    Past Surgical History:  Procedure Laterality Date  . ABDOMINAL HYSTERECTOMY    . APPENDECTOMY  2000  . BREAST BIOPSY Left   . BREAST RECONSTRUCTION WITH PLACEMENT OF TISSUE EXPANDER AND FLEX HD (ACELLULAR HYDRATED DERMIS) Bilateral 06/02/2015   Procedure: BILATERAL BREAST RECONSTRUCTION WITH PLACEMENT OF TISSUE EXPANDER AND  ACELLULAR HYDRATED DERMIS;  Surgeon: Irene Limbo, MD;  Location: [place];  Service: Plastics;  Laterality: Bilateral;  . CARPAL TUNNEL RELEASE Bilateral   . CATARACT EXTRACTION Bilateral 09/2013  . KNEE ARTHROSCOPY Left   . LAPAROSCOPIC ABDOMINAL EXPLORATION N/A 10/2017   LAPAROSCOPIC  LYSIS OF CONGENITAL ADHESIVE BAND/LADD'S BANDS WIH INTRAOPERATIVE CHOLANGIOGRAPHY. Perforation of small intestion requiring repair.   Marland Kitchen LOOP RECORDER INSERTION N/A 05/11/2016   Procedure: Loop Recorder Insertion;  Surgeon: Will Meredith Leeds, MD;  Location: [facility];  Service: Cardiovascular;  Laterality: N/A;  . MASTECTOMY W/ SENTINEL NODE BIOPSY Bilateral 06/02/2015   Procedure: BILATERAL MASTECTOMY WITH LEFT SENTINEL LYMPH NODE BIOPSY;  Surgeon: Stark Klein, MD;  Location: [place];  Service: General;  Laterality: Bilateral;  . NASAL SEPTUM SURGERY     x 3  . PLANTAR FASCIA SURGERY Left   . REMOVAL OF BILATERAL TISSUE EXPANDERS WITH PLACEMENT OF BILATERAL BREAST IMPLANTS Bilateral 06/17/2015   Procedure: DEBRIDEMENT OF BILATERAL MASTECTOMY FLAP WITH BILATERAL TISSUE EXPANDER EXCHANGE ;  Surgeon: Irene Limbo, MD;  Location: [place];  Service: Plastics;  Laterality: Bilateral;  . REMOVAL OF TISSUE EXPANDER Bilateral 07/12/2015   Procedure: REMOVAL OF BILATERAL TISSUE EXPANDERS;  Surgeon: Irene Limbo, MD;  Location: [place];  Service: Plastics;  Laterality: Bilateral;    Family History  Problem Relation Age of Onset  . Cervical cancer Mother 25  . Colon cancer Maternal Grandfather        mets to stomach; dx. 67-70  . Heart disease Father   . Prostate cancer Father 2  . Cervical cancer Maternal Grandmother        dx.  30s; treated with radium implant  . Lung cancer Sister 4       maternal half-sister dx. lung cancer, stage III  . Breast cancer Maternal Aunt   . Cervical cancer Sister 92       paternal half-sister; s/p TAH  . Spina bifida Grandchild   . Brain cancer Grandchild        grandson dx. at 20 mos, treated at [facility]  . Breast cancer Other 57       maternal half-sister's daughter  . Cancer Maternal Aunt        d. 94; unspecified type - "began at back area and moved to vital organs"  . Cancer Maternal Uncle         late 10s;  unspecified type; "started at back and moved to vital organs"    Social History   Socioeconomic History  . Marital status: Widowed    Spouse name: Not on file  . Number of children: 1  . Years of education: 1  . Highest education level: Not on file  Occupational History    Comment: [employer], retired  Tobacco Use  . Smoking status: Light Tobacco Smoker    Packs/day: 0.25    Types: Cigarettes    Last attempt to quit: 05/04/2015    Years since quitting: 4.7  . Smokeless tobacco: Never Used  Substance and Sexual Activity  . Alcohol use: Yes    Alcohol/week: 0.0 standard drinks    Comment: rarely  . Drug use: No  . Sexual activity: Not on file  Other Topics Concern  . Not on file  Social History Narrative   Lives alone, widow   Caffeine use- tea, 1 cup daily   Social Determinants of Health   Financial Resource Strain:   . Difficulty of Paying Living Expenses: Not on file  Food Insecurity:   . Worried About Charity fundraiser in the Last Year: Not on file  . Ran Out of Food in the Last Year: Not on file  Transportation Needs:   . Lack of Transportation (Medical): Not on file  . Lack of Transportation (Non-Medical): Not on file  Physical Activity:   . Days of Exercise per Week: Not on file  . Minutes of Exercise per Session: Not on file  Stress:   . Feeling of Stress : Not on file  Social Connections:   . Frequency of Communication with Friends and Family: Not on file  . Frequency of Social Gatherings with Friends and Family: Not on file  . Attends Religious Services: Not on file  . Active Member of Clubs or Organizations: Not on file  . Attends Archivist Meetings: Not on file  . Marital Status: Not on file  Intimate Partner Violence:   . Fear of Current or Ex-Partner: Not on file  . Emotionally Abused: Not on file  . Physically Abused: Not on file  . Sexually Abused: Not on file    Outpatient Medications Prior to Visit  Medication Sig  Dispense Refill  . albuterol (VENTOLIN HFA) 108 (90 Base) MCG/ACT inhaler Inhale 1-2 puffs into the lungs every 6 (six) hours as needed for wheezing or shortness of breath. 18 g 0  . amitriptyline (ELAVIL) 75 MG tablet TAKE 1 TABLET EVERY DAY 90 tablet 1  . atorvastatin (LIPITOR) 20 MG tablet Take 1 tablet (20 mg total) by mouth daily. 90 tablet 0  . benzonatate (TESSALON) 200 MG capsule Take 1 capsule (200 mg total) by  mouth 2 (two) times daily as needed for cough. 20 capsule 0  . benzonatate (TESSALON) 200 MG capsule Take 1 capsule (200 mg total) by mouth 3 (three) times daily as needed for cough. 30 capsule 5  . cetirizine (ZYRTEC) 10 MG tablet Take 1 tablet (10 mg total) by mouth daily. 30 tablet 11  . Cholecalciferol (VITAMIN D3) 50000 UNITS CAPS Take 50,000 Units by mouth once a week.     . clotrimazole (MYCELEX) 10 MG troche Take 1 tablet (10 mg total) by mouth 5 (five) times daily. 50 Troche 1  . fenofibrate 160 MG tablet Take 1 tablet (160 mg total) by mouth daily. 90 tablet 0  . fluorometholone (FML) 0.1 % ophthalmic suspension     . Fluticasone-Umeclidin-Vilant (TRELEGY ELLIPTA) 200-62.5-25 MCG/INH AEPB Inhale 1 puff into the lungs daily. 1 each 2  . furosemide (LASIX) 20 MG tablet Take 20 mg by mouth daily as needed for edema (for leg swelling).     . gabapentin (NEURONTIN) 300 MG capsule TAKE 1 CAPSULE BY MOUTH THREE TIMES DAILY 90 capsule 3  . HYDROcodone-acetaminophen (NORCO/VICODIN) 5-325 MG tablet Take 1 tablet by mouth 2 (two) times daily as needed for severe pain. 60 tablet 0  . methocarbamol (ROBAXIN) 500 MG tablet Take 500 mg by mouth.    . mirtazapine (REMERON) 45 MG tablet TAKE 1 TABLET BY MOUTH AT BEDTIME 30 tablet 2  . potassium chloride SA (KLOR-CON) 20 MEQ tablet TAKE 1 TABLET BY MOUTH ONCE DAILY WITH FOOD    . primidone (MYSOLINE) 50 MG tablet TAKE 1 TABLET BY MOUTH IN THE MORNING AND 1 AT BEDTIME 60 tablet 0  . promethazine (PHENERGAN) 25 MG tablet Take 1 tablet by  mouth as needed for nausea/vomiting.    . SUMAtriptan (IMITREX) 100 MG tablet TAKE ONE TABLET BY MOUTH AT ONSET OF HEADACHE THEN MAY REPEAT IN 2 HOURS NOT TO EXCEED 2 TABLETS IN 24 HOURS 9 tablet 0  . tamoxifen (NOLVADEX) 10 MG tablet Take 2 tablets by mouth once daily 90 tablet 3  . temazepam (RESTORIL) 15 MG capsule TAKE 1 CAPSULE BY MOUTH ONCE DAILY AT BEDTIME AS NEEDED 90 capsule 0  . tiZANidine (ZANAFLEX) 4 MG tablet TAKE 1 TABLET BY MOUTH EVERY 6 HOURS AS NEEDED DO  NOT  EXCEED  3  DOSES  IN  24  HOURS 120 tablet 0  . traMADol (ULTRAM) 50 MG tablet TAKE 1 TABLET BY MOUTH EVERY 12 HOURS AS NEEDED FOR  ABDOMINAL  PAIN 60 tablet 0  . traZODone (DESYREL) 100 MG tablet TAKE 1 TABLET (100 MG TOTAL) BY MOUTH AT BEDTIME AS NEEDED FOR SLEEP. 90 tablet 0  . Vitamin D, Ergocalciferol, (DRISDOL) 1.25 MG (50000 UNIT) CAPS capsule      Facility-Administered Medications Prior to Visit  Medication Dose Route Frequency Provider Last Rate Last Admin  . ipratropium-albuterol (DUONEB) 0.5-2.5 (3) MG/3ML nebulizer solution 3 mL  3 mL Nebulization Once [name], MD        Allergies  Allergen Reactions  . Rocephin [Ceftriaxone Sodium In Dextrose] Shortness Of Breath and Rash  . Clindamycin/Lincomycin Nausea And Vomiting  . Lyrica [Pregabalin] Other (See Comments)    PERSONALITY CHANGE    Review of Systems  Neurological: Positive for headaches.       2-3 /week-migraines-using hydrocodone        Objective:    Physical Exam Constitutional:      Appearance: She is well-developed.  HENT:     Head: Normocephalic  and atraumatic.  Pulmonary:     Effort: Pulmonary effort is normal.     Breath sounds: Examination of the right-lower field reveals decreased breath sounds. Examination of the left-lower field reveals decreased breath sounds. Decreased breath sounds present. No wheezing, rhonchi or rales.  Musculoskeletal:     Cervical back: Normal range of motion and neck supple.     Right lower leg: No  edema.     Left lower leg: No edema.  Neurological:     Mental Status: She is alert and oriented to person, place, and time.  Psychiatric:        Mood and Affect: Mood normal.        Behavior: Behavior normal.     BP 116/62 (BP Location: Right Arm, Patient Position: Sitting, Cuff Size: Normal)   Pulse 71   Temp 98.1 F (36.7 C) (Temporal)   Ht 5\' 1"  (1.549 m)   Wt 96 lb 9.6 oz (43.8 kg)   SpO2 95%   BMI 18.25 kg/m  Wt Readings from Last 3 Encounters:  01/20/20 96 lb 9.6 oz (43.8 kg)  01/14/20 95 lb 3.2 oz (43.2 kg)  12/08/19 94 lb (42.6 kg)    Health Maintenance Due  Topic Date Due  . Hepatitis C Screening  Never done  . MAMMOGRAM  Never done  . DEXA SCAN  Never done    Lab Results  Component Value Date   TSH 0.672 08/28/2019   Lab Results  Component Value Date   WBC 8.7 12/08/2019   HGB 11.1 12/08/2019   HCT 34.3 12/08/2019   MCV 93 12/08/2019   PLT 205 12/08/2019   Lab Results  Component Value Date   NA 142 12/08/2019   K 3.8 12/08/2019   CO2 25 12/08/2019   GLUCOSE 86 12/08/2019   BUN 19 12/08/2019   CREATININE 0.90 12/08/2019   BILITOT 0.2 12/08/2019   ALKPHOS 70 12/08/2019   AST 21 12/08/2019   ALT 11 12/08/2019   PROT 5.7 (L) 12/08/2019   ALBUMIN 3.7 12/08/2019   CALCIUM 8.6 (L) 12/08/2019   ANIONGAP 3 (L) 07/03/2016     Assessment & Plan:  1. COPD exacerbation ([place]) Prednisone-rx-taper dose-risk/benefit d/w pt Tessalon perles-continue  cxr-no recent  zithromax-rx 2. Sleep disorder Pt completed PFT at [facility], pt to complete sleep study Seen by pulmonary  3. Tobacco abuse 1/2 pack /day -encouraged to quit. Jacey Eckerson Hannah Beat, MD

## 2020-01-20 NOTE — Patient Instructions (Signed)
Chronic Obstructive Pulmonary Disease Chronic obstructive pulmonary disease (COPD) is a long-term (chronic) lung problem. When you have COPD, it is hard for air to get in and out of your lungs. Usually the condition gets worse over time, and your lungs will never return to normal. There are things you can do to keep yourself as healthy as possible.  Your doctor may treat your condition with: ? Medicines. ? Oxygen. ? Lung surgery.  Your doctor may also recommend: ? Rehabilitation. This includes steps to make your body work better. It may involve a team of specialists. ? Quitting smoking, if you smoke. ? Exercise and changes to your diet. ? Comfort measures (palliative care). Follow these instructions at home: Medicines  Take over-the-counter and prescription medicines only as told by your doctor.  Talk to your doctor before taking any cough or allergy medicines. You may need to avoid medicines that cause your lungs to be dry. Lifestyle  If you smoke, stop. Smoking makes the problem worse. If you need help quitting, ask your doctor.  Avoid being around things that make your breathing worse. This may include smoke, chemicals, and fumes.  Stay active, but remember to rest as well.  Learn and use tips on how to relax.  Make sure you get enough sleep. Most adults need at least 7 hours of sleep every night.  Eat healthy foods. Eat smaller meals more often. Rest before meals. Controlled breathing Learn and use tips on how to control your breathing as told by your doctor. Try:  Breathing in (inhaling) through your nose for 1 second. Then, pucker your lips and breath out (exhale) through your lips for 2 seconds.  Putting one hand on your belly (abdomen). Breathe in slowly through your nose for 1 second. Your hand on your belly should move out. Pucker your lips and breathe out slowly through your lips. Your hand on your belly should move in as you breathe out.  Controlled coughing Learn  and use controlled coughing to clear mucus from your lungs. Follow these steps: 1. Lean your head a little forward. 2. Breathe in deeply. 3. Try to hold your breath for 3 seconds. 4. Keep your mouth slightly open while coughing 2 times. 5. Spit any mucus out into a tissue. 6. Rest and do the steps again 1 or 2 times as needed. General instructions  Make sure you get all the shots (vaccines) that your doctor recommends. Ask your doctor about a flu shot and a pneumonia shot.  Use oxygen therapy and pulmonary rehabilitation if told by your doctor. If you need home oxygen therapy, ask your doctor if you should buy a tool to measure your oxygen level (oximeter).  Make a COPD action plan with your doctor. This helps you to know what to do if you feel worse than usual.  Manage any other conditions you have as told by your doctor.  Avoid going outside when it is very hot, cold, or humid.  Avoid people who have a sickness you can catch (contagious).  Keep all follow-up visits as told by your doctor. This is important. Contact a doctor if:  You cough up more mucus than usual.  There is a change in the color or thickness of the mucus.  It is harder to breathe than usual.  Your breathing is faster than usual.  You have trouble sleeping.  You need to use your medicines more often than usual.  You have trouble doing your normal activities such as getting dressed   or walking around the house. Get help right away if:  You have shortness of breath while resting.  You have shortness of breath that stops you from: ? Being able to talk. ? Doing normal activities.  Your chest hurts for longer than 5 minutes.  Your skin color is more blue than usual.  Your pulse oximeter shows that you have low oxygen for longer than 5 minutes.  You have a fever.  You feel too tired to breathe normally. Summary  Chronic obstructive pulmonary disease (COPD) is a long-term lung problem.  The way your  lungs work will never return to normal. Usually the condition gets worse over time. There are things you can do to keep yourself as healthy as possible.  Take over-the-counter and prescription medicines only as told by your doctor.  If you smoke, stop. Smoking makes the problem worse. This information is not intended to replace advice given to you by your health care provider. Make sure you discuss any questions you have with your health care provider. Document Revised: 02/01/2017 Document Reviewed: 03/26/2016 Elsevier Patient Education  2020 [employer]  

## 2020-02-01 ENCOUNTER — Other Ambulatory Visit: Payer: Self-pay

## 2020-02-01 ENCOUNTER — Encounter: Payer: Self-pay | Admitting: Family Medicine

## 2020-02-01 ENCOUNTER — Ambulatory Visit (INDEPENDENT_AMBULATORY_CARE_PROVIDER_SITE_OTHER): Payer: BC Managed Care – PPO | Admitting: Family Medicine

## 2020-02-01 VITALS — BP 110/64 | HR 84 | Temp 97.9°F | Resp 18 | Ht 61.0 in | Wt 98.6 lb

## 2020-02-01 DIAGNOSIS — G8929 Other chronic pain: Secondary | ICD-10-CM

## 2020-02-01 DIAGNOSIS — M545 Low back pain, unspecified: Secondary | ICD-10-CM

## 2020-02-01 DIAGNOSIS — E782 Mixed hyperlipidemia: Secondary | ICD-10-CM | POA: Diagnosis not present

## 2020-02-01 DIAGNOSIS — F331 Major depressive disorder, recurrent, moderate: Secondary | ICD-10-CM | POA: Diagnosis not present

## 2020-02-01 DIAGNOSIS — F5101 Primary insomnia: Secondary | ICD-10-CM

## 2020-02-01 DIAGNOSIS — J410 Simple chronic bronchitis: Secondary | ICD-10-CM | POA: Diagnosis not present

## 2020-02-01 DIAGNOSIS — G43509 Persistent migraine aura without cerebral infarction, not intractable, without status migrainosus: Secondary | ICD-10-CM

## 2020-02-01 DIAGNOSIS — R109 Unspecified abdominal pain: Secondary | ICD-10-CM

## 2020-02-01 DIAGNOSIS — J301 Allergic rhinitis due to pollen: Secondary | ICD-10-CM

## 2020-02-01 MED ORDER — TIZANIDINE HCL 4 MG PO TABS: 4.0000 mg | ORAL_TABLET | Freq: Every day | ORAL | 3 refills | Status: DC

## 2020-02-01 MED ORDER — TRAMADOL HCL 50 MG PO TABS: ORAL_TABLET | ORAL | 0 refills | Status: DC

## 2020-02-01 MED ORDER — PAROXETINE HCL 20 MG PO TABS: 20.0000 mg | ORAL_TABLET | Freq: Every day | ORAL | 1 refills | Status: DC

## 2020-02-01 MED ORDER — TRAZODONE HCL 100 MG PO TABS: 100.0000 mg | ORAL_TABLET | Freq: Every evening | ORAL | 0 refills | Status: DC | PRN

## 2020-02-01 NOTE — Progress Notes (Signed)
Subjective:  Patient ID: Anne [name], female    DOB: 03/28/1945  Age: 74 y.o. MRN: [id number]  Chief Complaint  Patient presents with  . COPD    HPI Patient presents for follow-up of her chronic medical issues which include COPD, migraines, hypertension, chronic malnourishment, depression, chronic cough, and chronic insomnia.  Hypertension: Patient is off all of her blood pressure medications.  She has lost a significant amount of weight and no longer needs her blood pressure medications.  Chronic malnourishment: Patient is only eating 1 meal a day.  She just does not feel like eating she says.  She is having a significant increase in depression.  Family is not supportive at all.  Her husband passed some years ago.  Depression: Significantly worsened.  PHQ-9 is abnormal showing moderate recurrent depression.  Previously on Paxil.  Patient is open to restarting.  Patient has had to move schools and is very unhappy at her new school.  This was due to a new principal at a previous school who seemed to "have it out for her."  Patient is also having persistent insomnia.  She has seen Dr. Annamaria Boots for her sleep abnormalities as well as her COPD.  Patient is currently taking trazodone 100 mg once at night, mirtazapine 15 mg once at night, tizanidine 4 mg once at night, and tramadol 50 mg once at night.  Migraines are occurring twice a week.  Patient takes Imitrex and hydrocodone when these occur.  She does not take Imitrex as soon as she sees an aura.  I have instructed her on how to take the medication appropriately.  Patient has chronic abdominal pain since she had abdominal surgery 2 to 3 years ago.  She has significant complications and this was likely due to scar tissue.  She currently takes the tramadol 50 mg once at nighttime to help with this.  Current Outpatient Medications on File Prior to Visit  Medication Sig Dispense Refill  . albuterol (VENTOLIN HFA) 108 (90 Base) MCG/ACT inhaler  Inhale 1-2 puffs into the lungs every 6 (six) hours as needed for wheezing or shortness of breath. 18 g 0  . amitriptyline (ELAVIL) 75 MG tablet TAKE 1 TABLET EVERY DAY 90 tablet 1  . atorvastatin (LIPITOR) 20 MG tablet Take 1 tablet (20 mg total) by mouth daily. 90 tablet 0  . benzonatate (TESSALON) 200 MG capsule Take 1 capsule (200 mg total) by mouth 2 (two) times daily as needed for cough. 20 capsule 0  . benzonatate (TESSALON) 200 MG capsule Take 1 capsule (200 mg total) by mouth 3 (three) times daily as needed for cough. 30 capsule 5  . cetirizine (ZYRTEC) 10 MG tablet Take 1 tablet (10 mg total) by mouth daily. 30 tablet 11  . Cholecalciferol (VITAMIN D3) 50000 UNITS CAPS Take 50,000 Units by mouth once a week.     . clotrimazole (MYCELEX) 10 MG troche Take 1 tablet (10 mg total) by mouth 5 (five) times daily. 50 Troche 1  . fenofibrate 160 MG tablet Take 1 tablet (160 mg total) by mouth daily. 90 tablet 0  . fluorometholone (FML) 0.1 % ophthalmic suspension     . Fluticasone-Umeclidin-Vilant (TRELEGY ELLIPTA) 200-62.5-25 MCG/INH AEPB Inhale 1 puff into the lungs daily. 1 each 2  . furosemide (LASIX) 20 MG tablet Take 20 mg by mouth daily as needed for edema (for leg swelling).     . gabapentin (NEURONTIN) 300 MG capsule TAKE 1 CAPSULE BY MOUTH THREE TIMES DAILY 90  capsule 3  . HYDROcodone-acetaminophen (NORCO/VICODIN) 5-325 MG tablet Take 1 tablet by mouth 2 (two) times daily as needed for severe pain. 60 tablet 0  . mirtazapine (REMERON) 45 MG tablet TAKE 1 TABLET BY MOUTH AT BEDTIME 30 tablet 2  . potassium chloride SA (KLOR-CON) 20 MEQ tablet TAKE 1 TABLET BY MOUTH ONCE DAILY WITH FOOD    . primidone (MYSOLINE) 50 MG tablet TAKE 1 TABLET BY MOUTH IN THE MORNING AND 1 AT BEDTIME 60 tablet 0  . promethazine (PHENERGAN) 25 MG tablet Take 1 tablet by mouth as needed for nausea/vomiting.    . SUMAtriptan (IMITREX) 100 MG tablet TAKE 1 TABLET BY MOUTH AT ONSET OF HEADACHE THEN MAY REPEAT IN 2  HOURS, NOT TO EXCEED 2 TABLETS IN 24 HOURS 9 tablet 0  . tamoxifen (NOLVADEX) 10 MG tablet Take 2 tablets by mouth once daily 90 tablet 3  . temazepam (RESTORIL) 15 MG capsule TAKE 1 CAPSULE BY MOUTH ONCE DAILY AT BEDTIME AS NEEDED 45 capsule 0  . Vitamin D, Ergocalciferol, (DRISDOL) 1.25 MG (50000 UNIT) CAPS capsule      Current Facility-Administered Medications on File Prior to Visit  Medication Dose Route Frequency Provider Last Rate Last Admin  . ipratropium-albuterol (DUONEB) 0.5-2.5 (3) MG/3ML nebulizer solution 3 mL  3 mL Nebulization Once [name], MD       Past Medical History:  Diagnosis Date  . Asthma    daily inhalers  . Breast cancer ([place]) 05/2015   left  . COPD (chronic obstructive pulmonary disease) ([place])    denies SOB with ADLs; no O2  . Dental crowns present   . Depression   . History of concussion 01/01/2014  . History of kidney stones   . History of TIA (transient ischemic attack) 10/2014  . Hyperlipidemia   . Insomnia   . Migraine   . Nasal congestion 05/26/2015   will finish z-pak 05/27/2015  . Nonproductive cough 05/26/2015  . PONV (postoperative nausea and vomiting)    "long time ago"  none recently  . RLS (restless legs syndrome)   . Tobacco abuse   . Vitamin D deficiency   . Wears dentures    lower  . Wears partial dentures    upper   Past Surgical History:  Procedure Laterality Date  . ABDOMINAL HYSTERECTOMY    . APPENDECTOMY  2000  . BREAST BIOPSY Left   . BREAST RECONSTRUCTION WITH PLACEMENT OF TISSUE EXPANDER AND FLEX HD (ACELLULAR HYDRATED DERMIS) Bilateral 06/02/2015   Procedure: BILATERAL BREAST RECONSTRUCTION WITH PLACEMENT OF TISSUE EXPANDER AND  ACELLULAR HYDRATED DERMIS;  Surgeon: Irene Limbo, MD;  Location: [facility];  Service: Plastics;  Laterality: Bilateral;  . CARPAL TUNNEL RELEASE Bilateral   . CATARACT EXTRACTION Bilateral 09/2013  . KNEE ARTHROSCOPY Left   . 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 Perforation of small intestion requiring repair.   Marland Kitchen LOOP RECORDER INSERTION N/A 05/11/2016   Procedure: Loop Recorder Insertion;  Surgeon: Will Meredith Leeds, MD;  Location: [facility];  Service: Cardiovascular;  Laterality: N/A;  . MASTECTOMY W/ SENTINEL NODE BIOPSY Bilateral 06/02/2015   Procedure: BILATERAL MASTECTOMY WITH LEFT SENTINEL LYMPH NODE BIOPSY;  Surgeon: Stark Klein, MD;  Location: [facility];  Service: General;  Laterality: Bilateral;  . NASAL SEPTUM SURGERY     x 3  . PLANTAR FASCIA SURGERY Left   . REMOVAL OF BILATERAL TISSUE  EXPANDERS WITH PLACEMENT OF BILATERAL BREAST IMPLANTS Bilateral 06/17/2015   Procedure: DEBRIDEMENT OF BILATERAL MASTECTOMY FLAP WITH BILATERAL TISSUE EXPANDER EXCHANGE ;  Surgeon: Irene Limbo, MD;  Location: [facility];  Service: Plastics;  Laterality: Bilateral;  . REMOVAL OF TISSUE EXPANDER Bilateral 07/12/2015   Procedure: REMOVAL OF BILATERAL TISSUE EXPANDERS;  Surgeon: Irene Limbo, MD;  Location: [facility];  Service: Plastics;  Laterality: Bilateral;    Family History  Problem Relation Age of Onset  . Cervical cancer Mother 34  . Colon cancer Maternal Grandfather        mets to stomach; dx. 67-70  . Heart disease Father   . Prostate cancer Father 45  . Cervical cancer Maternal Grandmother        dx. 43s; treated with radium implant  . Lung cancer Sister 40       maternal half-sister dx. lung cancer, stage III  . Breast cancer Maternal Aunt   . Cervical cancer Sister 31       paternal half-sister; s/p TAH  . Spina bifida Grandchild   . Brain cancer Grandchild        grandson dx. at 20 mos, treated at [facility]  . Breast cancer Other 29       maternal half-sister's daughter  . Cancer Maternal Aunt        d. 76; unspecified type - "began at back area and moved to vital organs"  . Cancer Maternal Uncle          late 61s; unspecified type; "started at back and moved to vital organs"   Social History   Socioeconomic History  . Marital status: Widowed    Spouse name: Not on file  . Number of children: 1  . Years of education: 62  . Highest education level: Not on file  Occupational History    Comment: [employer], retired  Tobacco Use  . Smoking status: Light Tobacco Smoker    Packs/day: 0.25    Types: Cigarettes    Last attempt to quit: 05/04/2015    Years since quitting: 4.7  . Smokeless tobacco: Never Used  Substance and Sexual Activity  . Alcohol use: Yes    Alcohol/week: 0.0 standard drinks    Comment: rarely  . Drug use: No  . Sexual activity: Not on file  Other Topics Concern  . Not on file  Social History Narrative   Lives alone, widow   Caffeine use- tea, 1 cup daily   Social Determinants of Health   Financial Resource Strain:   . Difficulty of Paying Living Expenses: Not on file  Food Insecurity:   . Worried About Charity fundraiser in the Last Year: Not on file  . Ran Out of Food in the Last Year: Not on file  Transportation Needs:   . Lack of Transportation (Medical): Not on file  . Lack of Transportation (Non-Medical): Not on file  Physical Activity:   . Days of Exercise per Week: Not on file  . Minutes of Exercise per Session: Not on file  Stress:   . Feeling of Stress : Not on file  Social Connections:   . Frequency of Communication with Friends and Family: Not on file  . Frequency of Social Gatherings with Friends and Family: Not on file  . Attends Religious Services: Not on file  . Active Member of Clubs or Organizations: Not on file  . Attends Archivist Meetings: Not on file  . Marital Status: Not  on file    Review of Systems  Constitutional: Positive for chills, fatigue and fever.  HENT: Positive for congestion (chest ) and rhinorrhea. Negative for ear pain and sore throat.   Respiratory: Positive for cough, shortness of breath and  wheezing.   Cardiovascular: Negative for chest pain and palpitations.  Gastrointestinal: Positive for abdominal pain. Negative for constipation, diarrhea, nausea and vomiting.  Genitourinary: Negative for dysuria and frequency.  Musculoskeletal: Negative for arthralgias and myalgias.  Neurological: Positive for light-headedness and headaches. Negative for dizziness.  Psychiatric/Behavioral: Negative for dysphoric mood. The patient is not nervous/anxious.     Objective:  BP 110/64   Pulse 84   Temp 97.9 F (36.6 C)   Resp 18   Ht 5\' 1"  (1.549 m)   Wt 98 lb 9.6 oz (44.7 kg)   BMI 18.63 kg/m   BP/Weight 02/01/2020 01/20/2020 09/62/8366  Systolic BP 294 765 465  Diastolic BP 64 62 68  Wt. (Lbs) 98.6 96.6 95.2  BMI 18.63 18.25 17.99    Physical Exam Vitals reviewed.  Constitutional:      Appearance: Normal appearance. She is normal weight.  Neck:     Vascular: No carotid bruit.  Cardiovascular:     Rate and Rhythm: Normal rate and regular rhythm.     Pulses: Normal pulses.     Heart sounds: Normal heart sounds.  Pulmonary:     Effort: Respiratory distress present.     Breath sounds: Rhonchi present.  Abdominal:     General: Abdomen is flat. Bowel sounds are normal.     Palpations: Abdomen is soft.     Tenderness: There is no abdominal tenderness.  Neurological:     Mental Status: She is alert and oriented to person, place, and time.  Psychiatric:        Mood and Affect: Mood normal.        Behavior: Behavior normal.     Lab Results  Component Value Date   WBC 8.7 12/08/2019   HGB 11.1 12/08/2019   HCT 34.3 12/08/2019   PLT 205 12/08/2019   GLUCOSE 86 12/08/2019   ALT 11 12/08/2019   AST 21 12/08/2019   NA 142 12/08/2019   K 3.8 12/08/2019   CL 108 (H) 12/08/2019   CREATININE 0.90 12/08/2019   BUN 19 12/08/2019   CO2 25 12/08/2019   TSH 0.672 08/28/2019      Assessment & Plan:   1. Simple chronic bronchitis (HCC) Poorly controlled. Recommend  continue current medicines.  Follow up with Dr. Annamaria Boots.  Recommend quit smoking before Christmas.  Patient challenged.    2. Mixed hyperlipidemia Await labs for recommendations. Continue to work on eating a healthy diet and exercise.  Labs drawn today.  - Lipid panel - Comprehensive metabolic panel  3. Depression, major, recurrent, moderate ([place]) Start on paxil 20 mg once daily at night. Continue mirtazepine 45 mg once at night. Please work on finding a charitable activity which will give you joy.   - PARoxetine (PAXIL) 20 MG tablet; Take 1 tablet (20 mg total) by mouth daily.  Dispense: 30 tablet; Refill: 1  4. Seasonal allergic rhinitis due to pollen Continue zyrtec.  5. Persistent migraine aura without cerebral infarction and without status migrainosus, not intractable Educated on taking sumatriptan when AURA starts and repeating it in 2 hours if persists. Then take hydrocodone if migraine persists.   6. Primary insomnia Continue mirtazepine, trazodone, amitriptyline, and start on paxil at night.  Pt needs to get  the sleep study that Dr. Annamaria Boots has ordered. Pt to follow up if they do not call.  - traZODone (DESYREL) 100 MG tablet; Take 1 tablet (100 mg total) by mouth at bedtime as needed for sleep.  Dispense: 90 tablet; Refill: 0  7. Chronic midline low back pain without sciatica Decrease tizanidine rx to match how often she is taking it.  - tiZANidine (ZANAFLEX) 4 MG tablet; Take 1 tablet (4 mg total) by mouth at bedtime.  Dispense: 30 tablet; Refill: 3  8. Chronic abdominal pain Start on tramadol. - traMADol (ULTRAM) 50 MG tablet; TAKE 1 TABLET BY MOUTH EVERY 12 HOURS AS NEEDED FOR  ABDOMINAL  PAIN  Dispense: 60 tablet; Refill: 0    Meds ordered this encounter  Medications  . PARoxetine (PAXIL) 20 MG tablet    Sig: Take 1 tablet (20 mg total) by mouth daily.    Dispense:  30 tablet    Refill:  1  . traZODone (DESYREL) 100 MG tablet    Sig: Take 1 tablet (100 mg total)  by mouth at bedtime as needed for sleep.    Dispense:  90 tablet    Refill:  0  . traMADol (ULTRAM) 50 MG tablet    Sig: TAKE 1 TABLET BY MOUTH EVERY 12 HOURS AS NEEDED FOR  ABDOMINAL  PAIN    Dispense:  60 tablet    Refill:  0  . tiZANidine (ZANAFLEX) 4 MG tablet    Sig: Take 1 tablet (4 mg total) by mouth at bedtime. TAKE 1 TABLET BY MOUTH EVERY 6 HOURS AS NEEDED DO  NOT  EXCEED  3  DOSES  IN  24  HOURS    Dispense:  30 tablet    Refill:  3    Orders Placed This Encounter  Procedures  . Lipid panel  . Comprehensive metabolic panel      I spent 30 minutes dedicated to the care of this patient on the date of this encounter to include face-to-face time with the patient, as well as: discussed Dr. Janee Morn notes, educated on migraines, recommended smoking cessation. Limit use of hydrocodone and tramadol.  Follow-up: Return in about 6 weeks (around 03/14/2020).  An After Visit Summary was printed and given to the patient.  [name], MD [facility] [phone]

## 2020-02-01 NOTE — Patient Instructions (Signed)
Start Paxil 20 mg once daily at night. Keep follow-up appointments with Dr. Annamaria Boots. If have not heard from him about setting up a sleep study, please call him back. Please work on finding a charitable activity which will give you joy.  Please stay safe. Recommend quit smoking before Christmas.  You can do it!

## 2020-02-02 LAB — COMPREHENSIVE METABOLIC PANEL
ALT: 12 IU/L (ref 0–32)
AST: 15 IU/L (ref 0–40)
Albumin/Globulin Ratio: 1.6 (ref 1.2–2.2)
Albumin: 3.5 g/dL — ABNORMAL LOW (ref 3.7–4.7)
Alkaline Phosphatase: 83 IU/L (ref 44–121)
BUN/Creatinine Ratio: 24 (ref 12–28)
BUN: 21 mg/dL (ref 8–27)
Bilirubin Total: <0.2 mg/dL (ref 0.0–1.2)
CO2: 25 mmol/L (ref 20–29)
Calcium: 8.5 mg/dL — ABNORMAL LOW (ref 8.7–10.3)
Chloride: 104 mmol/L (ref 96–106)
Creatinine, Ser: 0.88 mg/dL (ref 0.57–1.00)
GFR calc Af Amer: 75 mL/min/{1.73_m2} (ref >59)
GFR calc non Af Amer: 65 mL/min/{1.73_m2} (ref >59)
Globulin, Total: 2.2 g/dL (ref 1.5–4.5)
Glucose: 98 mg/dL (ref 65–99)
Potassium: 5 mmol/L (ref 3.5–5.2)
Sodium: 140 mmol/L (ref 134–144)
Total Protein: 5.7 g/dL — ABNORMAL LOW (ref 6.0–8.5)

## 2020-02-02 LAB — LIPID PANEL
Chol/HDL Ratio: 2.5 ratio (ref 0.0–4.4)
Cholesterol, Total: 150 mg/dL (ref 100–199)
HDL: 59 mg/dL (ref >39)
LDL Chol Calc (NIH): 74 mg/dL (ref 0–99)
Triglycerides: 91 mg/dL (ref 0–149)
VLDL Cholesterol Cal: 17 mg/dL (ref 5–40)

## 2020-02-02 LAB — CARDIOVASCULAR RISK ASSESSMENT

## 2020-02-11 ENCOUNTER — Telehealth: Payer: Self-pay

## 2020-02-11 ENCOUNTER — Other Ambulatory Visit: Payer: Self-pay | Admitting: Family Medicine

## 2020-02-11 ENCOUNTER — Encounter: Payer: Self-pay | Admitting: Internal Medicine

## 2020-02-11 MED ORDER — AMOXICILLIN-POT CLAVULANATE 875-125 MG PO TABS: 1.0000 | ORAL_TABLET | Freq: Two times a day (BID) | ORAL | 0 refills | Status: DC

## 2020-02-11 NOTE — Telephone Encounter (Signed)
Patient was informed that Dr Tobie Poet sent Augmentin to [facility].

## 2020-02-16 ENCOUNTER — Ambulatory Visit: Payer: BC Managed Care – PPO

## 2020-02-17 ENCOUNTER — Other Ambulatory Visit: Payer: Self-pay | Admitting: Family Medicine

## 2020-02-18 ENCOUNTER — Encounter: Payer: Self-pay | Admitting: Family Medicine

## 2020-02-28 ENCOUNTER — Other Ambulatory Visit: Payer: Self-pay | Admitting: Family Medicine

## 2020-03-01 ENCOUNTER — Other Ambulatory Visit: Payer: Self-pay

## 2020-03-01 ENCOUNTER — Ambulatory Visit (INDEPENDENT_AMBULATORY_CARE_PROVIDER_SITE_OTHER): Payer: BC Managed Care – PPO

## 2020-03-01 ENCOUNTER — Encounter: Payer: Self-pay | Admitting: Family Medicine

## 2020-03-01 ENCOUNTER — Ambulatory Visit (INDEPENDENT_AMBULATORY_CARE_PROVIDER_SITE_OTHER): Payer: BC Managed Care – PPO | Admitting: Family Medicine

## 2020-03-01 VITALS — BP 132/70 | HR 89 | Temp 97.6°F | Wt 93.0 lb

## 2020-03-01 DIAGNOSIS — J449 Chronic obstructive pulmonary disease, unspecified: Secondary | ICD-10-CM

## 2020-03-01 DIAGNOSIS — R5383 Other fatigue: Secondary | ICD-10-CM | POA: Diagnosis not present

## 2020-03-01 DIAGNOSIS — R413 Other amnesia: Secondary | ICD-10-CM

## 2020-03-01 DIAGNOSIS — B370 Candidal stomatitis: Secondary | ICD-10-CM | POA: Diagnosis not present

## 2020-03-01 DIAGNOSIS — Z23 Encounter for immunization: Secondary | ICD-10-CM

## 2020-03-01 DIAGNOSIS — J0100 Acute maxillary sinusitis, unspecified: Secondary | ICD-10-CM | POA: Diagnosis not present

## 2020-03-01 MED ORDER — CLOTRIMAZOLE 10 MG MT TROC: 10.0000 mg | Freq: Every day | OROMUCOSAL | 3 refills | Status: DC

## 2020-03-01 MED ORDER — SULFAMETHOXAZOLE-TRIMETHOPRIM 800-160 MG PO TABS
1.0000 | ORAL_TABLET | Freq: Two times a day (BID) | ORAL | 0 refills | Status: DC
Start: 2020-03-01 — End: 2020-03-14

## 2020-03-01 NOTE — Progress Notes (Signed)
Acute Office Visit  Subjective:    Patient ID: Anne [name], female    DOB: 1945-08-10, 74 y.o.   MRN: [id number]  Chief Complaint  Patient presents with  . URI    HPI Patient is in today for URI symptoms which have not improved. Reports that her sputum is not as yellow in color, it is improving. Patient has been on three different antibiotics (levaquin x 12 days, zpack, amoxicillin)  Short term memory- Complains that her memory has not "been what it should". Has family hx of dementia. Scored 28/30 on MME  Loss of balance- feels off balance especially when she bends over.  Past Medical History:  Diagnosis Date  . Asthma    daily inhalers  . Breast cancer ([place]) 05/2015   left  . COPD (chronic obstructive pulmonary disease) ([place])    denies SOB with ADLs; no O2  . Dental crowns present   . Depression   . History of concussion 01/01/2014  . History of kidney stones   . History of TIA (transient ischemic attack) 10/2014  . Hyperlipidemia   . Insomnia   . Migraine   . Nasal congestion 05/26/2015   will finish z-pak 05/27/2015  . Nonproductive cough 05/26/2015  . PONV (postoperative nausea and vomiting)    "long time ago"  none recently  . RLS (restless legs syndrome)   . Tobacco abuse   . Vitamin D deficiency   . Wears dentures    lower  . Wears partial dentures    upper    Past Surgical History:  Procedure Laterality Date  . ABDOMINAL HYSTERECTOMY    . APPENDECTOMY  2000  . BREAST BIOPSY Left   . BREAST RECONSTRUCTION WITH PLACEMENT OF TISSUE EXPANDER AND FLEX HD (ACELLULAR HYDRATED DERMIS) Bilateral 06/02/2015   Procedure: BILATERAL BREAST RECONSTRUCTION WITH PLACEMENT OF TISSUE EXPANDER AND  ACELLULAR HYDRATED DERMIS;  Surgeon: Irene Limbo, MD;  Location: [facility];  Service: Plastics;  Laterality: Bilateral;  . CARPAL TUNNEL RELEASE Bilateral   . CATARACT EXTRACTION Bilateral 09/2013  . KNEE ARTHROSCOPY Left   . 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 Perforation of small intestion requiring repair.   Marland Kitchen LOOP RECORDER INSERTION N/A 05/11/2016   Procedure: Loop Recorder Insertion;  Surgeon: Will Meredith Leeds, MD;  Location: [facility];  Service: Cardiovascular;  Laterality: N/A;  . MASTECTOMY W/ SENTINEL NODE BIOPSY Bilateral 06/02/2015   Procedure: BILATERAL MASTECTOMY WITH LEFT SENTINEL LYMPH NODE BIOPSY;  Surgeon: Stark Klein, MD;  Location: [facility];  Service: General;  Laterality: Bilateral;  . NASAL SEPTUM SURGERY     x 3  . PLANTAR FASCIA SURGERY Left   . REMOVAL OF BILATERAL TISSUE EXPANDERS WITH PLACEMENT OF BILATERAL BREAST IMPLANTS Bilateral 06/17/2015   Procedure: DEBRIDEMENT OF BILATERAL MASTECTOMY FLAP WITH BILATERAL TISSUE EXPANDER EXCHANGE ;  Surgeon: Irene Limbo, MD;  Location: [facility];  Service: Plastics;  Laterality: Bilateral;  . REMOVAL OF TISSUE EXPANDER Bilateral 07/12/2015   Procedure: REMOVAL OF BILATERAL TISSUE EXPANDERS;  Surgeon: Irene Limbo, MD;  Location: [facility];  Service: Plastics;  Laterality: Bilateral;    Family History  Problem Relation Age of Onset  . Cervical cancer Mother 68  . Colon cancer Maternal Grandfather        mets to stomach; dx. 67-70  . Heart disease Father   . Prostate cancer Father 71  . Cervical  cancer Maternal Grandmother        dx. 71s; treated with radium implant  . Lung cancer Sister 42       maternal half-sister dx. lung cancer, stage III  . Breast cancer Maternal Aunt   . Cervical cancer Sister 5       paternal half-sister; s/p TAH  . Spina bifida Grandchild   . Brain cancer Grandchild        grandson dx. at 20 mos, [facility] at [facility]  . Breast cancer Other 51       maternal half-sister's daughter  . Cancer Maternal Aunt        d. 3; unspecified type - "began at back area and moved to vital organs"  .  Cancer Maternal Uncle         late 57s; unspecified type; "started at back and moved to vital organs"    Social History   Socioeconomic History  . Marital status: Widowed    Spouse name: Not on file  . Number of children: 1  . Years of education: 39  . Highest education level: Not on file  Occupational History    Comment: [employer], retired  Tobacco Use  . Smoking status: Light Tobacco Smoker    Packs/day: 0.25    Types: Cigarettes    Last attempt to quit: 05/04/2015    Years since quitting: 4.8  . Smokeless tobacco: Never Used  Substance and Sexual Activity  . Alcohol use: Yes    Alcohol/week: 0.0 standard drinks    Comment: rarely  . Drug use: No  . Sexual activity: Not on file  Other Topics Concern  . Not on file  Social History Narrative   Lives alone, widow   Caffeine use- tea, 1 cup daily   Social Determinants of Health   Financial Resource Strain: Not on file  Food Insecurity: Not on file  Transportation Needs: Not on file  Physical Activity: Not on file  Stress: Not on file  Social Connections: Not on file  Intimate Partner Violence: Not on file    Outpatient Medications Prior to Visit  Medication Sig Dispense Refill  . albuterol (VENTOLIN HFA) 108 (90 Base) MCG/ACT inhaler INHALE 1 TO 2 PUFFS BY MOUTH EVERY 6 HOURS AS NEEDED FOR WHEEZING FOR SHORTNESS OF BREATH 18 g 0  . amitriptyline (ELAVIL) 75 MG tablet TAKE 1 TABLET EVERY DAY 90 tablet 1  . atorvastatin (LIPITOR) 20 MG tablet Take 1 tablet (20 mg total) by mouth daily. 90 tablet 0  . benzonatate (TESSALON) 200 MG capsule Take 1 capsule (200 mg total) by mouth 2 (two) times daily as needed for cough. 20 capsule 0  . benzonatate (TESSALON) 200 MG capsule Take 1 capsule (200 mg total) by mouth 3 (three) times daily as needed for cough. 30 capsule 5  . cetirizine (ZYRTEC) 10 MG tablet Take 1 tablet (10 mg total) by mouth daily. 30 tablet 11  . Cholecalciferol (VITAMIN D3) 50000 UNITS CAPS Take  50,000 Units by mouth once a week.     . fenofibrate 160 MG tablet Take 1 tablet (160 mg total) by mouth daily. 90 tablet 0  . fluorometholone (FML) 0.1 % ophthalmic suspension     . Fluticasone-Umeclidin-Vilant (TRELEGY ELLIPTA) 200-62.5-25 MCG/INH AEPB Inhale 1 puff into the lungs daily. 1 each 2  . furosemide (LASIX) 20 MG tablet Take 20 mg by mouth daily as needed for edema (for leg swelling).     . gabapentin (NEURONTIN) 300 MG  capsule TAKE 1 CAPSULE BY MOUTH THREE TIMES DAILY 90 capsule 3  . HYDROcodone-acetaminophen (NORCO/VICODIN) 5-325 MG tablet Take 1 tablet by mouth 2 (two) times daily as needed for severe pain. 60 tablet 0  . methocarbamol (ROBAXIN) 500 MG tablet Take 1 tablet by mouth 4 times daily 120 tablet 0  . mirtazapine (REMERON) 45 MG tablet TAKE 1 TABLET BY MOUTH AT BEDTIME 30 tablet 0  . PARoxetine (PAXIL) 20 MG tablet Take 1 tablet (20 mg total) by mouth daily. 30 tablet 1  . potassium chloride SA (KLOR-CON) 20 MEQ tablet TAKE 1 TABLET BY MOUTH ONCE DAILY WITH FOOD    . primidone (MYSOLINE) 50 MG tablet TAKE 1 TABLET BY MOUTH IN THE MORNING AND 1 AT BEDTIME 60 tablet 0  . promethazine (PHENERGAN) 25 MG tablet Take 1 tablet by mouth as needed for nausea/vomiting.    . SUMAtriptan (IMITREX) 100 MG tablet TAKE 1 TABLET BY MOUTH AT ONSET OF HEADACHE THEN MAY REPEAT IN 2 HOURS, NOT TO EXCEED 2 TABLETS IN 24 HOURS 9 tablet 0  . tamoxifen (NOLVADEX) 10 MG tablet Take 2 tablets by mouth once daily 90 tablet 3  . temazepam (RESTORIL) 15 MG capsule TAKE 1 CAPSULE BY MOUTH ONCE DAILY AT BEDTIME AS NEEDED 45 capsule 0  . tiZANidine (ZANAFLEX) 4 MG tablet Take 1 tablet (4 mg total) by mouth at bedtime. TAKE 1 TABLET BY MOUTH EVERY 6 HOURS AS NEEDED DO  NOT  EXCEED  3  DOSES  IN  24  HOURS 30 tablet 3  . traMADol (ULTRAM) 50 MG tablet TAKE 1 TABLET BY MOUTH EVERY 12 HOURS AS NEEDED FOR  ABDOMINAL  PAIN 60 tablet 0  . traZODone (DESYREL) 100 MG tablet Take 1 tablet (100 mg total) by mouth  at bedtime as needed for sleep. 90 tablet 0  . Vitamin D, Ergocalciferol, (DRISDOL) 1.25 MG (50000 UNIT) CAPS capsule     . amoxicillin-clavulanate (AUGMENTIN) 875-125 MG tablet Take 1 tablet by mouth 2 (two) times daily. 20 tablet 0  . clotrimazole (MYCELEX) 10 MG troche Take 1 tablet (10 mg total) by mouth 5 (five) times daily. 50 Troche 1   Facility-Administered Medications Prior to Visit  Medication Dose Route Frequency Provider Last Rate Last Admin  . ipratropium-albuterol (DUONEB) 0.5-2.5 (3) MG/3ML nebulizer solution 3 mL  3 mL Nebulization Once Blane Ohara, MD        Allergies  Allergen Reactions  . Rocephin [Ceftriaxone Sodium In Dextrose] Shortness Of Breath and Rash  . Clindamycin/Lincomycin Nausea And Vomiting  . Lyrica [Pregabalin] Other (See Comments)    PERSONALITY CHANGE    Review of Systems  Constitutional: Positive for fatigue. Negative for chills and fever.  HENT: Positive for congestion, rhinorrhea, sinus pressure and sinus pain. Negative for sore throat.   Respiratory: Positive for cough and shortness of breath.   Cardiovascular: Negative for chest pain.  Gastrointestinal: Negative for diarrhea, nausea and vomiting.  Musculoskeletal: Positive for myalgias.  Neurological: Positive for headaches. Negative for dizziness.       Loss of balance. Memory loss.        Objective:    Physical Exam Vitals reviewed.  Constitutional:      Appearance: Normal appearance. She is normal weight.  HENT:     Right Ear: Tympanic membrane, ear canal and external ear normal.     Left Ear: Tympanic membrane, ear canal and external ear normal.     Nose: Congestion present.     Comments:  Sinus tenderness BL.    Mouth/Throat:     Tongue: Lesions (erythema. no lesios. no ulcers. ) present.     Pharynx: Oropharynx is clear. No oropharyngeal exudate or posterior oropharyngeal erythema.  Cardiovascular:     Rate and Rhythm: Normal rate and regular rhythm.     Heart sounds:  Normal heart sounds. No murmur heard.   Pulmonary:     Effort: Pulmonary effort is normal. No respiratory distress.     Breath sounds: Wheezing present. No rhonchi.  Abdominal:     General: Abdomen is flat. Bowel sounds are normal.     Palpations: Abdomen is soft.     Tenderness: There is no abdominal tenderness.  Neurological:     Mental Status: She is alert and oriented to person, place, and time.  Psychiatric:        Mood and Affect: Mood normal.        Behavior: Behavior normal.     BP 132/70   Pulse 89   Temp 97.6 F (36.4 C)   Wt 93 lb (42.2 kg)   SpO2 98%   BMI 17.57 kg/m  Wt Readings from Last 3 Encounters:  03/01/20 93 lb (42.2 kg)  02/01/20 98 lb 9.6 oz (44.7 kg)  01/20/20 96 lb 9.6 oz (43.8 kg)    Health Maintenance Due  Topic Date Due  . Hepatitis C Screening  Never done  . MAMMOGRAM  02/17/2017    There are no preventive care reminders to display for this patient.   Lab Results  Component Value Date   TSH 1.320 03/01/2020   Lab Results  Component Value Date   WBC 12.8 (H) 03/01/2020   HGB 12.5 03/01/2020   HCT 39.1 03/01/2020   MCV 90 03/01/2020   PLT 289 03/01/2020   Lab Results  Component Value Date   NA 144 03/01/2020   K 3.9 03/01/2020   CO2 28 03/01/2020   GLUCOSE 86 03/01/2020   BUN 14 03/01/2020   CREATININE 0.77 03/01/2020   BILITOT <0.2 03/01/2020   ALKPHOS 103 03/01/2020   AST 20 03/01/2020   ALT 18 03/01/2020   PROT 6.4 03/01/2020   ALBUMIN 4.1 03/01/2020   CALCIUM 9.0 03/01/2020   ANIONGAP 3 (L) 07/03/2016   Lab Results  Component Value Date   CHOL 150 02/01/2020   Lab Results  Component Value Date   HDL 59 02/01/2020   Lab Results  Component Value Date   LDLCALC 74 02/01/2020   Lab Results  Component Value Date   TRIG 91 02/01/2020   Lab Results  Component Value Date   CHOLHDL 2.5 02/01/2020   No results found for: HGBA1C     Assessment & Plan:  1. Other fatigue - CBC with Differential/Platelet -  Comprehensive metabolic panel - TSH  2. Acute non-recurrent maxillary sinusitis - sulfamethoxazole-trimethoprim (BACTRIM DS) 800-160 MG tablet; Take 1 tablet by mouth 2 (two) times daily.  Dispense: 20 tablet; Refill: 0  3. Thrush - clotrimazole (MYCELEX) 10 MG troche; Take 1 tablet (10 mg total) by mouth 5 (five) times daily.  Dispense: 50 Troche; Refill: 3  4. Memory loss - B12 and Folate Panel - TSH - Methylmalonic acid, serum  5. COPD mixed type ([place])   check ct scan of lung with contrast  Meds ordered this encounter  Medications  . sulfamethoxazole-trimethoprim (BACTRIM DS) 800-160 MG tablet    Sig: Take 1 tablet by mouth 2 (two) times daily.    Dispense:  20 tablet  Refill:  0  . clotrimazole (MYCELEX) 10 MG troche    Sig: Take 1 tablet (10 mg total) by mouth 5 (five) times daily.    Dispense:  50 Troche    Refill:  3    Orders Placed This Encounter  Procedures  . B12 and Folate Panel  . CBC with Differential/Platelet  . Comprehensive metabolic panel  . TSH  . Methylmalonic acid, serum    Follow-up: Return if symptoms worsen or fail to improve.  An After Visit Summary was printed and given to the patient.  [name], MD [facility] [phone]

## 2020-03-01 NOTE — Progress Notes (Signed)
   Covid-19 Vaccination Clinic  Name:  Anne [name]    MRN: [id number] DOB: [birth year]  03/01/2020  [name] was observed post Covid-19 immunization for 15 minutes without incident. She was provided with Vaccine Information Sheet and instruction to access the V-Safe system.   [name] was instructed to call 911 with any severe reactions post vaccine: Marland Kitchen Difficulty breathing  . Swelling of face and throat  . A fast heartbeat  . A bad rash all over body  . Dizziness and weakness   Immunizations Administered    Name Date Dose VIS Date Route   Moderna Covid-19 Booster Vaccine 03/01/2020 10:45 AM 0.25 mL 12/23/2019 Intramuscular   Manufacturer: Gala Murdoch   Lot: [id number]   NDC: 06237-628-31

## 2020-03-04 LAB — B12 AND FOLATE PANEL
Folate: 6.4 ng/mL (ref >3.0)
Vitamin B-12: 641 pg/mL (ref 232–1245)

## 2020-03-04 LAB — CBC WITH DIFFERENTIAL/PLATELET
Basophils Absolute: 0 10*3/uL (ref 0.0–0.2)
Basos: 0 %
EOS (ABSOLUTE): 0.1 10*3/uL (ref 0.0–0.4)
Eos: 1 %
Hematocrit: 39.1 % (ref 34.0–46.6)
Hemoglobin: 12.5 g/dL (ref 11.1–15.9)
Immature Grans (Abs): 0.1 10*3/uL (ref 0.0–0.1)
Immature Granulocytes: 1 %
Lymphocytes Absolute: 3.2 10*3/uL — ABNORMAL HIGH (ref 0.7–3.1)
Lymphs: 25 %
MCH: 28.7 pg (ref 26.6–33.0)
MCHC: 32 g/dL (ref 31.5–35.7)
MCV: 90 fL (ref 79–97)
Monocytes Absolute: 1.4 10*3/uL — ABNORMAL HIGH (ref 0.1–0.9)
Monocytes: 11 %
Neutrophils Absolute: 8 10*3/uL — ABNORMAL HIGH (ref 1.4–7.0)
Neutrophils: 62 %
Platelets: 289 10*3/uL (ref 150–450)
RBC: 4.35 x10E6/uL (ref 3.77–5.28)
RDW: 13 % (ref 11.7–15.4)
WBC: 12.8 10*3/uL — ABNORMAL HIGH (ref 3.4–10.8)

## 2020-03-04 LAB — COMPREHENSIVE METABOLIC PANEL
ALT: 18 IU/L (ref 0–32)
AST: 20 IU/L (ref 0–40)
Albumin/Globulin Ratio: 1.8 (ref 1.2–2.2)
Albumin: 4.1 g/dL (ref 3.7–4.7)
Alkaline Phosphatase: 103 IU/L (ref 44–121)
BUN/Creatinine Ratio: 18 (ref 12–28)
BUN: 14 mg/dL (ref 8–27)
Bilirubin Total: <0.2 mg/dL (ref 0.0–1.2)
CO2: 28 mmol/L (ref 20–29)
Calcium: 9 mg/dL (ref 8.7–10.3)
Chloride: 104 mmol/L (ref 96–106)
Creatinine, Ser: 0.77 mg/dL (ref 0.57–1.00)
GFR calc Af Amer: 89 mL/min/{1.73_m2} (ref >59)
GFR calc non Af Amer: 77 mL/min/{1.73_m2} (ref >59)
Globulin, Total: 2.3 g/dL (ref 1.5–4.5)
Glucose: 86 mg/dL (ref 65–99)
Potassium: 3.9 mmol/L (ref 3.5–5.2)
Sodium: 144 mmol/L (ref 134–144)
Total Protein: 6.4 g/dL (ref 6.0–8.5)

## 2020-03-04 LAB — METHYLMALONIC ACID, SERUM: Methylmalonic Acid: 282 nmol/L (ref 0–378)

## 2020-03-04 LAB — TSH: TSH: 1.32 u[IU]/mL (ref 0.450–4.500)

## 2020-03-10 ENCOUNTER — Telehealth: Payer: Self-pay

## 2020-03-10 NOTE — Telephone Encounter (Signed)
Anne [name] called to report that she completed her antibiotics and she continues to have congestion.  Dr. [name] had requested that she call us back for chest xray if symptoms have not cleared.  Discussed with Flonnie Hailstone, NP and order for chest xray faxed to [facility].

## 2020-03-13 NOTE — Progress Notes (Signed)
Subjective:  Patient ID: Anne [name], female    DOB: [birth year]  Age: 75 y.o. MRN: [id number]  Chief Complaint  Patient presents with  . COPD    HPI COPD: on Trelegy one inhalation daily. Completed bactrim DS. Patient has not had ct of chest yet as ordered 1-2 weeks ago. Thrush: completed mycelex, but has refills. Still feels like she has thrush.  Hoarseness comes and goes. Feels like an elephant on her chest. Hurts to take a deep breat.  Hyperlipidemia: on lipitor 20 mg once daily at night. Chronic Back Pain: on gabapentin, tramadol, amitriptyline. Insomnia: on trazodone and temazepam.  Tremors: not on primidone for some time, but worked when she took it and she is having worsening tremors.   Current Outpatient Medications on File Prior to Visit  Medication Sig Dispense Refill  . albuterol (VENTOLIN HFA) 108 (90 Base) MCG/ACT inhaler INHALE 1 TO 2 PUFFS BY MOUTH EVERY 6 HOURS AS NEEDED FOR WHEEZING FOR SHORTNESS OF BREATH 18 g 0  . amitriptyline (ELAVIL) 75 MG tablet TAKE 1 TABLET EVERY DAY 90 tablet 1  . atorvastatin (LIPITOR) 20 MG tablet Take 1 tablet (20 mg total) by mouth daily. 90 tablet 0  . cetirizine (ZYRTEC) 10 MG tablet Take 1 tablet (10 mg total) by mouth daily. 30 tablet 11  . Cholecalciferol (VITAMIN D3) 50000 UNITS CAPS Take 50,000 Units by mouth once a week.     . clotrimazole (MYCELEX) 10 MG troche Take 1 tablet (10 mg total) by mouth 5 (five) times daily. 50 Troche 3  . fenofibrate 160 MG tablet Take 1 tablet (160 mg total) by mouth daily. 90 tablet 0  . fluorometholone (FML) 0.1 % ophthalmic suspension     . Fluticasone-Umeclidin-Vilant (TRELEGY ELLIPTA) 200-62.5-25 MCG/INH AEPB Inhale 1 puff into the lungs daily. 1 each 2  . furosemide (LASIX) 20 MG tablet Take 20 mg by mouth daily as needed for edema (for leg swelling).     . gabapentin (NEURONTIN) 300 MG capsule TAKE 1 CAPSULE BY MOUTH THREE TIMES DAILY 90 capsule 3  . HYDROcodone-acetaminophen  (NORCO/VICODIN) 5-325 MG tablet Take 1 tablet by mouth 2 (two) times daily as needed for severe pain. 60 tablet 0  . methocarbamol (ROBAXIN) 500 MG tablet Take 1 tablet by mouth 4 times daily 120 tablet 0  . mirtazapine (REMERON) 45 MG tablet TAKE 1 TABLET BY MOUTH AT BEDTIME 30 tablet 0  . PARoxetine (PAXIL) 20 MG tablet Take 1 tablet (20 mg total) by mouth daily. 30 tablet 1  . potassium chloride SA (KLOR-CON) 20 MEQ tablet TAKE 1 TABLET BY MOUTH ONCE DAILY WITH FOOD    . primidone (MYSOLINE) 50 MG tablet TAKE 1 TABLET BY MOUTH IN THE MORNING AND 1 AT BEDTIME 60 tablet 0  . promethazine (PHENERGAN) 25 MG tablet Take 1 tablet by mouth as needed for nausea/vomiting.    . SUMAtriptan (IMITREX) 100 MG tablet TAKE 1 TABLET BY MOUTH AT ONSET OF HEADACHE THEN MAY REPEAT IN 2 HOURS, NOT TO EXCEED 2 TABLETS IN 24 HOURS 9 tablet 0  . tamoxifen (NOLVADEX) 10 MG tablet Take 2 tablets by mouth once daily 90 tablet 3  . temazepam (RESTORIL) 15 MG capsule TAKE 1 CAPSULE BY MOUTH ONCE DAILY AT BEDTIME AS NEEDED 45 capsule 0  . tiZANidine (ZANAFLEX) 4 MG tablet Take 1 tablet (4 mg total) by mouth at bedtime. TAKE 1 TABLET BY MOUTH EVERY 6 HOURS AS NEEDED DO  NOT  EXCEED  3  DOSES  IN  24  HOURS 30 tablet 3  . traMADol (ULTRAM) 50 MG tablet TAKE 1 TABLET BY MOUTH EVERY 12 HOURS AS NEEDED FOR  ABDOMINAL  PAIN 60 tablet 0  . traZODone (DESYREL) 100 MG tablet Take 1 tablet (100 mg total) by mouth at bedtime as needed for sleep. 90 tablet 0  . Vitamin D, Ergocalciferol, (DRISDOL) 1.25 MG (50000 UNIT) CAPS capsule      Current Facility-Administered Medications on File Prior to Visit  Medication Dose Route Frequency Provider Last Rate Last Admin  . ipratropium-albuterol (DUONEB) 0.5-2.5 (3) MG/3ML nebulizer solution 3 mL  3 mL Nebulization Once [name], MD       Past Medical History:  Diagnosis Date  . Asthma    daily inhalers  . Breast cancer ([place]) 05/2015   left  . COPD (chronic obstructive pulmonary  disease) ([place])    denies SOB with ADLs; no O2  . Dental crowns present   . Depression   . History of concussion 01/01/2014  . History of kidney stones   . History of TIA (transient ischemic attack) 10/2014  . Hyperlipidemia   . Insomnia   . Migraine   . Nasal congestion 05/26/2015   will finish z-pak 05/27/2015  . Nonproductive cough 05/26/2015  . PONV (postoperative nausea and vomiting)    "long time ago"  none recently  . RLS (restless legs syndrome)   . Tobacco abuse   . Vitamin D deficiency   . Wears dentures    lower  . Wears partial dentures    upper   Past Surgical History:  Procedure Laterality Date  . ABDOMINAL HYSTERECTOMY    . APPENDECTOMY  2000  . BREAST BIOPSY Left   . BREAST RECONSTRUCTION WITH PLACEMENT OF TISSUE EXPANDER AND FLEX HD (ACELLULAR HYDRATED DERMIS) Bilateral 06/02/2015   Procedure: BILATERAL BREAST RECONSTRUCTION WITH PLACEMENT OF TISSUE EXPANDER AND  ACELLULAR HYDRATED DERMIS;  Surgeon: Irene Limbo, MD;  Location: [place];  Service: Plastics;  Laterality: Bilateral;  . CARPAL TUNNEL RELEASE Bilateral   . CATARACT EXTRACTION Bilateral 09/2013  . KNEE ARTHROSCOPY Left   . 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 Perforation of small intestion requiring repair.   Marland Kitchen LOOP RECORDER INSERTION N/A 05/11/2016   Procedure: Loop Recorder Insertion;  Surgeon: Will Meredith Leeds, MD;  Location: [place] [facility];  Service: Cardiovascular;  Laterality: N/A;  . MASTECTOMY W/ SENTINEL NODE BIOPSY Bilateral 06/02/2015   Procedure: BILATERAL MASTECTOMY WITH LEFT SENTINEL LYMPH NODE BIOPSY;  Surgeon: Stark Klein, MD;  Location: [place];  Service: General;  Laterality: Bilateral;  . NASAL SEPTUM SURGERY     x 3  . PLANTAR FASCIA SURGERY Left   . REMOVAL OF BILATERAL TISSUE EXPANDERS WITH PLACEMENT OF BILATERAL BREAST IMPLANTS  Bilateral 06/17/2015   Procedure: DEBRIDEMENT OF BILATERAL MASTECTOMY FLAP WITH BILATERAL TISSUE EXPANDER EXCHANGE ;  Surgeon: Irene Limbo, MD;  Location: [place];  Service: Plastics;  Laterality: Bilateral;  . REMOVAL OF TISSUE EXPANDER Bilateral 07/12/2015   Procedure: REMOVAL OF BILATERAL TISSUE EXPANDERS;  Surgeon: Irene Limbo, MD;  Location: [place];  Service: Plastics;  Laterality: Bilateral;    Family History  Problem Relation Age of Onset  . Cervical cancer Mother 44  . Colon cancer Maternal Grandfather        mets to stomach; dx. 67-70  . Heart disease Father   . Prostate  cancer Father 22  . Cervical cancer Maternal Grandmother        dx. 75s; treated with radium implant  . Lung cancer Sister 65       maternal half-sister dx. lung cancer, stage III  . Breast cancer Maternal Aunt   . Cervical cancer Sister 54       paternal half-sister; s/p TAH  . Spina bifida Grandchild   . Brain cancer Grandchild        grandson dx. at 20 mos, treated at [facility]  . Breast cancer Other 43       maternal half-sister's daughter  . Cancer Maternal Aunt        d. 6; unspecified type - "began at back area and moved to vital organs"  . Cancer Maternal Uncle         late 25s; unspecified type; "started at back and moved to vital organs"   Social History   Socioeconomic History  . Marital status: Widowed    Spouse name: Not on file  . Number of children: 1  . Years of education: 43  . Highest education level: Not on file  Occupational History    Comment: [employer], retired  Tobacco Use  . Smoking status: Light Tobacco Smoker    Packs/day: 0.25    Types: Cigarettes    Last attempt to quit: 05/04/2015    Years since quitting: 4.8  . Smokeless tobacco: Never Used  Substance and Sexual Activity  . Alcohol use: Yes    Alcohol/week: 0.0 standard drinks    Comment: rarely  . Drug use: No  . Sexual activity: Not on file  Other Topics Concern  . Not on file   Social History Narrative   Lives alone, widow   Caffeine use- tea, 1 cup daily   Social Determinants of Health   Financial Resource Strain: Not on file  Food Insecurity: Not on file  Transportation Needs: Not on file  Physical Activity: Not on file  Stress: Not on file  Social Connections: Not on file    Review of Systems  Constitutional: Positive for fatigue and unexpected weight change. Negative for chills and fever.  HENT: Positive for congestion, rhinorrhea and sore throat. Negative for ear pain.   Respiratory: Positive for cough and shortness of breath.   Cardiovascular: Negative for chest pain.  Gastrointestinal: Negative for abdominal pain, constipation, diarrhea, nausea and vomiting.  Endocrine: Negative for polydipsia, polyphagia and polyuria.  Genitourinary: Negative for dysuria and urgency.  Musculoskeletal: Positive for back pain and myalgias. Negative for arthralgias.  Skin: Negative for rash.  Neurological: Positive for headaches. Negative for dizziness.  Psychiatric/Behavioral: Negative for dysphoric mood. The patient is not nervous/anxious.      Objective:  BP (!) 110/40   Pulse 88   Temp 97.8 F (36.6 C)   Resp 20   Ht 5\' 1"  (1.549 m)   Wt 95 lb 9.6 oz (43.4 kg)   BMI 18.06 kg/m   BP/Weight 03/14/2020 03/01/2020 [id number]  Systolic BP [id number]  Diastolic BP 40 70 64  Wt. (Lbs) 95.6 93 98.6  BMI 18.06 17.57 18.63    Physical Exam Vitals reviewed.  Constitutional:      Appearance: Normal appearance. She is normal weight.  HENT:     Right Ear: Tympanic membrane normal.     Left Ear: Tympanic membrane normal.     Nose: Nose normal.     Mouth/Throat:     Mouth: Mucous membranes are  dry.     Pharynx: No oropharyngeal exudate or posterior oropharyngeal erythema.  Cardiovascular:     Rate and Rhythm: Normal rate and regular rhythm.     Heart sounds: Normal heart sounds.  Pulmonary:     [place]: No respiratory distress.     Breath sounds:  Normal breath sounds. No wheezing.     Comments: Normal effort.  Neurological:     Mental Status: She is alert and oriented to person, place, and time.  Psychiatric:        Mood and Affect: Mood normal.        Behavior: Behavior normal.     Lab Results  Component Value Date   WBC 12.8 (H) 03/01/2020   HGB 12.5 03/01/2020   HCT 39.1 03/01/2020   PLT 289 03/01/2020   GLUCOSE 86 03/01/2020   CHOL 150 02/01/2020   TRIG 91 02/01/2020   HDL 59 02/01/2020   LDLCALC 74 02/01/2020   ALT 18 03/01/2020   AST 20 03/01/2020   NA 144 03/01/2020   K 3.9 03/01/2020   CL 104 03/01/2020   CREATININE 0.77 03/01/2020   BUN 14 03/01/2020   CO2 28 03/01/2020   TSH 1.320 03/01/2020      Assessment & Plan:   1. COPD mixed type ([place]) The current medical regimen is effective;  continue present plan and medications. - DG Chest 2 View - Order ct of chest. Discuss with Dr. Annamaria Boots, pulmonology.  Refilled tessalon perles.  2. Mixed hyperlipidemia Continue current medicines and low fat diet and exercise.  3. Primary insomnia The current medical regimen is effective;  continue present plan and medications.  4. Depression, major, recurrent, mild (HCC)  Continue current medicines.   5. Laryngitis  Gargle warm salt water.  Consider referral to ENT.  Likely injured vocal cords from coughing.   6. Tremor - restart primidone.   Follow-up: No follow-ups on file.  An After Visit Summary was printed and given to the patient.  [name], MD [facility] [phone]

## 2020-03-14 ENCOUNTER — Encounter: Payer: Self-pay | Admitting: Family Medicine

## 2020-03-14 ENCOUNTER — Other Ambulatory Visit: Payer: Self-pay

## 2020-03-14 ENCOUNTER — Ambulatory Visit (INDEPENDENT_AMBULATORY_CARE_PROVIDER_SITE_OTHER): Payer: BC Managed Care – PPO | Admitting: Family Medicine

## 2020-03-14 ENCOUNTER — Telehealth: Payer: Self-pay

## 2020-03-14 VITALS — BP 110/40 | HR 88 | Temp 97.8°F | Resp 20 | Ht 61.0 in | Wt 95.6 lb

## 2020-03-14 DIAGNOSIS — F330 Major depressive disorder, recurrent, mild: Secondary | ICD-10-CM | POA: Diagnosis not present

## 2020-03-14 DIAGNOSIS — R251 Tremor, unspecified: Secondary | ICD-10-CM | POA: Diagnosis not present

## 2020-03-14 DIAGNOSIS — J449 Chronic obstructive pulmonary disease, unspecified: Secondary | ICD-10-CM

## 2020-03-14 DIAGNOSIS — J040 Acute laryngitis: Secondary | ICD-10-CM | POA: Diagnosis not present

## 2020-03-14 DIAGNOSIS — F5101 Primary insomnia: Secondary | ICD-10-CM | POA: Diagnosis not present

## 2020-03-14 DIAGNOSIS — E782 Mixed hyperlipidemia: Secondary | ICD-10-CM | POA: Diagnosis not present

## 2020-03-14 MED ORDER — PRIMIDONE 50 MG PO TABS: ORAL_TABLET | ORAL | 5 refills | Status: DC

## 2020-03-14 MED ORDER — BENZONATATE 200 MG PO CAPS: 200.0000 mg | ORAL_CAPSULE | Freq: Three times a day (TID) | ORAL | 1 refills | Status: DC | PRN

## 2020-03-14 NOTE — Telephone Encounter (Signed)
Pt was scheduled an left a detailed voicemail for her to know that she has a CT scan of her chest scheduled 03/16/2020 @ [facility].

## 2020-03-15 ENCOUNTER — Telehealth: Payer: Self-pay

## 2020-03-15 NOTE — Telephone Encounter (Signed)
Left message informing patient that per Dr. Tobie Poet her chest xray was normal, go forward with having CT scan of her chest and to keep her appt with Dr. Annamaria Boots.

## 2020-03-31 ENCOUNTER — Other Ambulatory Visit: Payer: Self-pay | Admitting: Family Medicine

## 2020-03-31 ENCOUNTER — Other Ambulatory Visit: Payer: Self-pay | Admitting: Physician Assistant

## 2020-03-31 DIAGNOSIS — G8929 Other chronic pain: Secondary | ICD-10-CM

## 2020-03-31 DIAGNOSIS — M5416 Radiculopathy, lumbar region: Secondary | ICD-10-CM

## 2020-03-31 DIAGNOSIS — F331 Major depressive disorder, recurrent, moderate: Secondary | ICD-10-CM

## 2020-03-31 MED ORDER — HYDROCODONE-ACETAMINOPHEN 5-325 MG PO TABS: 1.0000 | ORAL_TABLET | Freq: Two times a day (BID) | ORAL | 0 refills | Status: DC | PRN

## 2020-04-06 DIAGNOSIS — K8689 Other specified diseases of pancreas: Secondary | ICD-10-CM | POA: Diagnosis not present

## 2020-04-06 DIAGNOSIS — K838 Other specified diseases of biliary tract: Secondary | ICD-10-CM | POA: Diagnosis not present

## 2020-04-06 DIAGNOSIS — R1032 Left lower quadrant pain: Secondary | ICD-10-CM | POA: Diagnosis not present

## 2020-04-06 DIAGNOSIS — N200 Calculus of kidney: Secondary | ICD-10-CM | POA: Diagnosis not present

## 2020-04-06 DIAGNOSIS — Q453 Other congenital malformations of pancreas and pancreatic duct: Secondary | ICD-10-CM | POA: Diagnosis not present

## 2020-04-12 NOTE — Progress Notes (Signed)
Subjective:  Patient ID: Anne [name], female    DOB: 04-25-1945  Age: 75 y.o. MRN: [id number]  Chief Complaint  Patient presents with  . Abdominal Pain    HPI Here to discuss LLQ abdominal pain has been present for months, worsened 3 days prior to ED visit on 04/06/2020. Pt called [name] ranking her pain a 15/10.. No source of pain found in ED. Labs and ct scan of abdomen and pelvis were inconclusive.  Pt had an extensive surgery for a malrotation deformity a couple of years ago and had postop complications requiring her wound to heal by secondary intention. Surgery performed by Dr. Noberto Retort. ED felt she might have adhesions.  She is continuing to have significant pain today. She is rating at a 10, but is overall fairly calm.  Pt is also complaining of migraines twice a week. Headaches last several hours. Takes hydrocodone and imitrex if does not help within an hour. Has auras.  Recurrent depression, moderate. - pt feels her depression is directly related to her abdominal pain. If we could figure this out and decrease or resolve her pain, she would not be depressed.   Current Outpatient Medications on File Prior to Visit  Medication Sig Dispense Refill  . venlafaxine XR (EFFEXOR-XR) 75 MG 24 hr capsule TAKE 3 CAPSULES BY MOUTH ONCE DAILY 270 capsule 0  . albuterol (VENTOLIN HFA) 108 (90 Base) MCG/ACT inhaler INHALE 1 TO 2 PUFFS BY MOUTH EVERY 6 HOURS AS NEEDED FOR WHEEZING FOR SHORTNESS OF BREATH 18 g 0  . atorvastatin (LIPITOR) 20 MG tablet Take 1 tablet (20 mg total) by mouth daily. 90 tablet 0  . benzonatate (TESSALON) 200 MG capsule Take 1 capsule (200 mg total) by mouth 3 (three) times daily as needed for cough. 30 capsule 1  . cetirizine (ZYRTEC) 10 MG tablet Take 1 tablet (10 mg total) by mouth daily. 30 tablet 11  . Cholecalciferol (VITAMIN D3) 50000 UNITS CAPS Take 50,000 Units by mouth once a week.     . fenofibrate 160 MG tablet Take 1 tablet (160 mg total) by mouth daily. 90  tablet 0  . Fluticasone-Umeclidin-Vilant (TRELEGY ELLIPTA) 200-62.5-25 MCG/INH AEPB Inhale 1 puff into the lungs daily. 1 each 2  . furosemide (LASIX) 20 MG tablet Take 20 mg by mouth daily as needed for edema (for leg swelling).     . gabapentin (NEURONTIN) 300 MG capsule TAKE 1 CAPSULE BY MOUTH THREE TIMES DAILY 90 capsule 3  . HYDROcodone-acetaminophen (NORCO/VICODIN) 5-325 MG tablet Take 1 tablet by mouth 2 (two) times daily as needed for severe pain. 60 tablet 0  . PARoxetine (PAXIL) 20 MG tablet Take 1 tablet by mouth once daily 30 tablet 0  . potassium chloride SA (KLOR-CON) 20 MEQ tablet TAKE 1 TABLET BY MOUTH ONCE DAILY WITH FOOD    . primidone (MYSOLINE) 50 MG tablet TAKE 1 TABLET BY MOUTH IN THE MORNING AND 1 AT BEDTIME 60 tablet 5  . promethazine (PHENERGAN) 25 MG tablet Take 1 tablet by mouth as needed for nausea/vomiting.    . SUMAtriptan (IMITREX) 100 MG tablet TAKE 1 TABLET BY MOUTH AT ONSET OF HEADACHE THEN MAY REPEAT IN 2 HOURS, NOT TO EXCEED 2 TABLETS IN 24 HOURS 9 tablet 0  . tamoxifen (NOLVADEX) 10 MG tablet Take 2 tablets by mouth once daily 90 tablet 3  . temazepam (RESTORIL) 15 MG capsule TAKE 1 CAPSULE BY MOUTH ONCE DAILY AT BEDTIME AS NEEDED 45 capsule 0  . tiZANidine (  ZANAFLEX) 4 MG tablet Take 1 tablet (4 mg total) by mouth at bedtime. TAKE 1 TABLET BY MOUTH EVERY 6 HOURS AS NEEDED DO  NOT  EXCEED  3  DOSES  IN  24  HOURS 30 tablet 3  . traMADol (ULTRAM) 50 MG tablet TAKE 1 TABLET BY MOUTH EVERY 12 HOURS AS NEEDED FOR ABDOMINAL PAIN 60 tablet 0  . traZODone (DESYREL) 100 MG tablet Take 1 tablet (100 mg total) by mouth at bedtime as needed for sleep. 90 tablet 0  . Vitamin D, Ergocalciferol, (DRISDOL) 1.25 MG (50000 UNIT) CAPS capsule      Current Facility-Administered Medications on File Prior to Visit  Medication Dose Route Frequency Provider Last Rate Last Admin  . ipratropium-albuterol (DUONEB) 0.5-2.5 (3) MG/3ML nebulizer solution 3 mL  3 mL Nebulization Once [name], MD       Past Medical History:  Diagnosis Date  . Asthma    daily inhalers  . Breast cancer ([place]) 05/2015   left  . COPD (chronic obstructive pulmonary disease) ([place])    denies SOB with ADLs; no O2  . Dental crowns present   . Depression   . History of concussion 01/01/2014  . History of kidney stones   . History of TIA (transient ischemic attack) 10/2014  . Hyperlipidemia   . Insomnia   . Migraine   . Nasal congestion 05/26/2015   will finish z-pak 05/27/2015  . Nonproductive cough 05/26/2015  . PONV (postoperative nausea and vomiting)    "long time ago"  none recently  . RLS (restless legs syndrome)   . Tobacco abuse   . Vitamin D deficiency   . Wears dentures    lower  . Wears partial dentures    upper   Past Surgical History:  Procedure Laterality Date  . ABDOMINAL HYSTERECTOMY    . APPENDECTOMY  2000  . BREAST BIOPSY Left   . BREAST RECONSTRUCTION WITH PLACEMENT OF TISSUE EXPANDER AND FLEX HD (ACELLULAR HYDRATED DERMIS) Bilateral 06/02/2015   Procedure: BILATERAL BREAST RECONSTRUCTION WITH PLACEMENT OF TISSUE EXPANDER AND  ACELLULAR HYDRATED DERMIS;  Surgeon: Irene Limbo, MD;  Location: [place];  Service: Plastics;  Laterality: Bilateral;  . CARPAL TUNNEL RELEASE Bilateral   . CATARACT EXTRACTION Bilateral 09/2013  . KNEE ARTHROSCOPY Left   . 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 Perforation of small intestion requiring repair.   Marland Kitchen LOOP RECORDER INSERTION N/A 05/11/2016   Procedure: Loop Recorder Insertion;  Surgeon: Will Meredith Leeds, MD;  Location: [place] [facility];  Service: Cardiovascular;  Laterality: N/A;  . MASTECTOMY W/ SENTINEL NODE BIOPSY Bilateral 06/02/2015   Procedure: BILATERAL MASTECTOMY WITH LEFT SENTINEL LYMPH NODE BIOPSY;  Surgeon: Stark Klein, MD;  Location: [place];  Service: General;  Laterality:  Bilateral;  . NASAL SEPTUM SURGERY     x 3  . PLANTAR FASCIA SURGERY Left   . REMOVAL OF BILATERAL TISSUE EXPANDERS WITH PLACEMENT OF BILATERAL BREAST IMPLANTS Bilateral 06/17/2015   Procedure: DEBRIDEMENT OF BILATERAL MASTECTOMY FLAP WITH BILATERAL TISSUE EXPANDER EXCHANGE ;  Surgeon: Irene Limbo, MD;  Location: [place];  Service: Plastics;  Laterality: Bilateral;  . REMOVAL OF TISSUE EXPANDER Bilateral 07/12/2015   Procedure: REMOVAL OF BILATERAL TISSUE EXPANDERS;  Surgeon: Irene Limbo, MD;  Location: [place];  Service: Plastics;  Laterality: Bilateral;    Family History  Problem Relation Age of Onset  . Cervical  cancer Mother 47  . Colon cancer Maternal Grandfather        mets to stomach; dx. 67-70  . Heart disease Father   . Prostate cancer Father 47  . Cervical cancer Maternal Grandmother        dx. 1s; treated with radium implant  . Lung cancer Sister 80       maternal half-sister dx. lung cancer, stage III  . Breast cancer Maternal Aunt   . Cervical cancer Sister 13       paternal half-sister; s/p TAH  . Spina bifida Grandchild   . Brain cancer Grandchild        grandson dx. at 20 mos, [facility]  . Breast cancer Other 82       maternal half-sister's daughter  . Cancer Maternal Aunt        d. 91; unspecified type - "began at back area and moved to vital organs"  . Cancer Maternal Uncle         late 23s; unspecified type; "started at back and moved to vital organs"   Social History   Socioeconomic History  . Marital status: Widowed    Spouse name: Not on file  . Number of children: 1  . Years of education: 75  . Highest education level: Not on file  Occupational History    Comment: [employer], retired  Tobacco Use  . Smoking status: Former Smoker    Packs/day: 0.25    Types: Cigarettes    Quit date: 03/21/2020    Years since quitting: 0.0  . Smokeless tobacco: Never Used  Substance and Sexual Activity  . Alcohol use: Yes     Alcohol/week: 0.0 standard drinks    Comment: rarely  . Drug use: No  . Sexual activity: Not on file  Other Topics Concern  . Not on file  Social History Narrative   Lives alone, widow   Caffeine use- tea, 1 cup daily   Social Determinants of Health   Financial Resource Strain: Not on file  Food Insecurity: Not on file  Transportation Needs: Not on file  Physical Activity: Not on file  Stress: Not on file  Social Connections: Not on file    Review of Systems  Constitutional: Positive for fatigue. Negative for chills and fever.  HENT: Positive for rhinorrhea. Negative for congestion, ear pain and sore throat.   Respiratory: Positive for cough and shortness of breath.   Cardiovascular: Negative for chest pain and palpitations.  Gastrointestinal: Positive for abdominal pain, nausea and vomiting (x 1 past Sunday. ). Negative for constipation and diarrhea.  Genitourinary: Negative for dysuria and urgency.  Musculoskeletal: Positive for back pain (Lower back hurts with abd pain). Negative for myalgias.  Neurological: Positive for tremors, light-headedness, numbness (BL fingers (mainly on rt hand and first 3 digits. and numbness on toes. BL. ) and headaches (2 x week, Last several hours, takes sumatriptan and hydrocodone.). Negative for dizziness and weakness.  Psychiatric/Behavioral: Positive for dysphoric mood and sleep disturbance (chronic. on numerous medicines over the years. Currently on trazodone, temazepam 15 mg once qhs and mirtazepine 45 mg one qhs.). The patient is nervous/anxious.      Objective:  BP 132/64   Pulse 100   Temp 98 F (36.7 C)   Resp 20   Ht 5' (1.524 m)   Wt 98 lb (44.5 kg)   BMI 19.14 kg/m   BP/Weight 04/14/2020 03/14/2020 03/54/6568  Systolic BP 127 517 001  Diastolic BP  64 40 70  Wt. (Lbs) 98 95.6 93  BMI 19.14 18.06 17.57    Physical Exam Vitals reviewed.  Constitutional:      Appearance: Normal appearance.  Neck:     Vascular: No  carotid bruit.  Cardiovascular:     Rate and Rhythm: Normal rate and regular rhythm.     Pulses: Normal pulses.     Heart sounds: Normal heart sounds.  Pulmonary:     [place]: Pulmonary effort is normal. No respiratory distress.     Breath sounds: Normal breath sounds.  Abdominal:     General: Abdomen is flat. Bowel sounds are normal.     Palpations: Abdomen is soft.     Tenderness: There is abdominal tenderness in the epigastric area and left lower quadrant. There is no right CVA tenderness, left CVA tenderness, guarding or rebound.  Neurological:     Mental Status: She is alert.     Comments: Positive phalens on rt. Negative phalen's on left.  Negative tinels BL  Psychiatric:        Behavior: Behavior normal.     Comments: Depressed, flat affect.      Lab Results  Component Value Date   WBC 12.8 (H) 03/01/2020   HGB 12.5 03/01/2020   HCT 39.1 03/01/2020   PLT 289 03/01/2020   GLUCOSE 86 03/01/2020   CHOL 150 02/01/2020   TRIG 91 02/01/2020   HDL 59 02/01/2020   LDLCALC 74 02/01/2020   ALT 18 03/01/2020   AST 20 03/01/2020   NA 144 03/01/2020   K 3.9 03/01/2020   CL 104 03/01/2020   CREATININE 0.77 03/01/2020   BUN 14 03/01/2020   CO2 28 03/01/2020   TSH 1.320 03/01/2020      Assessment & Plan:   1. LLQ abdominal pain: significantly worsened.  2. Epigastric abdominal tenderness without rebound tenderness: Has been present since abdominal surgery.  I am concerned about adhesions.  Plan: refer to Dr. Noberto Retort. Would like pt to be seen Friday am.   3. Paresthesias - checked b12, folate, mma at last visit and were normal.  I have previously ordered a [facility] EMG, but pt was unable to go due to work restrictons.  4. Right hand pain Pt has already been treated for cts with surgery on BL hands.  Recommend emg/ncv.  5. Other insomnia Continue current medications.  6. Moderate recurrent major depression (HCC) Pt is on numerous medications for this. In the past I  have discussed counseling, but pt has been restricted by work and by financial problems.   7. Other migraine without status migrainosus, not intractable  Strongly recommended pt take imitrex as soon as she is aware a migraine is coming on. Particularly during aura before migraine comes on.   8. Memory loss: MMSE 29/30. - order mri of brain.   Meds ordered this encounter  Medications  . amitriptyline (ELAVIL) 75 MG tablet    Sig: Take 1 tablet (75 mg total) by mouth daily.    Dispense:  90 tablet    Refill:  1  . mirtazapine (REMERON) 45 MG tablet    Sig: Take 1 tablet (45 mg total) by mouth at bedtime.    Dispense:  90 tablet    Refill:  1   Follow-up: Return in 4 weeks (on 05/12/2020) for fasting.  An After Visit Summary was printed and given to the patient.  [name], MD [facility] [phone]

## 2020-04-14 ENCOUNTER — Ambulatory Visit (INDEPENDENT_AMBULATORY_CARE_PROVIDER_SITE_OTHER): Payer: BC Managed Care – PPO | Admitting: Family Medicine

## 2020-04-14 ENCOUNTER — Encounter: Payer: Self-pay | Admitting: Family Medicine

## 2020-04-14 ENCOUNTER — Other Ambulatory Visit: Payer: Self-pay

## 2020-04-14 VITALS — BP 132/64 | HR 100 | Temp 98.0°F | Resp 20 | Ht 60.0 in | Wt 98.0 lb

## 2020-04-14 DIAGNOSIS — R413 Other amnesia: Secondary | ICD-10-CM | POA: Diagnosis not present

## 2020-04-14 DIAGNOSIS — R1032 Left lower quadrant pain: Secondary | ICD-10-CM

## 2020-04-14 DIAGNOSIS — M79641 Pain in right hand: Secondary | ICD-10-CM | POA: Diagnosis not present

## 2020-04-14 DIAGNOSIS — F331 Major depressive disorder, recurrent, moderate: Secondary | ICD-10-CM | POA: Diagnosis not present

## 2020-04-14 DIAGNOSIS — R202 Paresthesia of skin: Secondary | ICD-10-CM | POA: Diagnosis not present

## 2020-04-14 DIAGNOSIS — R10826 Epigastric rebound abdominal tenderness: Secondary | ICD-10-CM | POA: Diagnosis not present

## 2020-04-14 DIAGNOSIS — G43809 Other migraine, not intractable, without status migrainosus: Secondary | ICD-10-CM | POA: Diagnosis not present

## 2020-04-14 DIAGNOSIS — G4709 Other insomnia: Secondary | ICD-10-CM

## 2020-04-14 MED ORDER — MIRTAZAPINE 45 MG PO TABS: 45.0000 mg | ORAL_TABLET | Freq: Every day | ORAL | 1 refills | Status: DC

## 2020-04-14 MED ORDER — AMITRIPTYLINE HCL 75 MG PO TABS: 75.0000 mg | ORAL_TABLET | Freq: Every day | ORAL | 1 refills | Status: DC

## 2020-04-14 NOTE — Progress Notes (Incomplete)
Subjective:  Patient ID: Anne [name], female    DOB: [birth year]  Age: 75 y.o. MRN: [id number]  Chief Complaint  Patient presents with  . Abdominal Pain    HPI Here to discuss LLQ abdominal pain has been present for months, worsened 3 days prior to ED visit on 04/06/2020. Pt called [name] ranking her pain a 15/10.. No source of pain found in ED. Labs and ct scan of abdomen and pelvis were inconclusive.  Pt had an extensive surgery for a malrotation deformity a couple of years ago and had postop complications requiring her wound to heal by secondary intention. Surgery performed by Dr. Noberto Retort. ED felt she might have adhesions.  She is continuing to have significant pain today. She is rating at a 10, but is overall fairly calm.  Pt is also complaining of migraines twice a week. Headaches last several hours. Takes hydrocodone and imitrex if does not help within an hour. Has auras.  Recurrent depression, moderate. - pt feels her depression is directly related to her abdominal pain. If we could figure this out and decrease or resolve her pain, she would not be depressed.   Current Outpatient Medications on File Prior to Visit  Medication Sig Dispense Refill  . venlafaxine XR (EFFEXOR-XR) 75 MG 24 hr capsule TAKE 3 CAPSULES BY MOUTH ONCE DAILY 270 capsule 0  . albuterol (VENTOLIN HFA) 108 (90 Base) MCG/ACT inhaler INHALE 1 TO 2 PUFFS BY MOUTH EVERY 6 HOURS AS NEEDED FOR WHEEZING FOR SHORTNESS OF BREATH 18 g 0  . atorvastatin (LIPITOR) 20 MG tablet Take 1 tablet (20 mg total) by mouth daily. 90 tablet 0  . benzonatate (TESSALON) 200 MG capsule Take 1 capsule (200 mg total) by mouth 3 (three) times daily as needed for cough. 30 capsule 1  . cetirizine (ZYRTEC) 10 MG tablet Take 1 tablet (10 mg total) by mouth daily. 30 tablet 11  . Cholecalciferol (VITAMIN D3) 50000 UNITS CAPS Take 50,000 Units by mouth once a week.     . fenofibrate 160 MG tablet Take 1 tablet (160 mg total) by mouth daily. 90  tablet 0  . Fluticasone-Umeclidin-Vilant (TRELEGY ELLIPTA) 200-62.5-25 MCG/INH AEPB Inhale 1 puff into the lungs daily. 1 each 2  . furosemide (LASIX) 20 MG tablet Take 20 mg by mouth daily as needed for edema (for leg swelling).     . gabapentin (NEURONTIN) 300 MG capsule TAKE 1 CAPSULE BY MOUTH THREE TIMES DAILY 90 capsule 3  . HYDROcodone-acetaminophen (NORCO/VICODIN) 5-325 MG tablet Take 1 tablet by mouth 2 (two) times daily as needed for severe pain. 60 tablet 0  . PARoxetine (PAXIL) 20 MG tablet Take 1 tablet by mouth once daily 30 tablet 0  . potassium chloride SA (KLOR-CON) 20 MEQ tablet TAKE 1 TABLET BY MOUTH ONCE DAILY WITH FOOD    . primidone (MYSOLINE) 50 MG tablet TAKE 1 TABLET BY MOUTH IN THE MORNING AND 1 AT BEDTIME 60 tablet 5  . promethazine (PHENERGAN) 25 MG tablet Take 1 tablet by mouth as needed for nausea/vomiting.    . SUMAtriptan (IMITREX) 100 MG tablet TAKE 1 TABLET BY MOUTH AT ONSET OF HEADACHE THEN MAY REPEAT IN 2 HOURS, NOT TO EXCEED 2 TABLETS IN 24 HOURS 9 tablet 0  . tamoxifen (NOLVADEX) 10 MG tablet Take 2 tablets by mouth once daily 90 tablet 3  . temazepam (RESTORIL) 15 MG capsule TAKE 1 CAPSULE BY MOUTH ONCE DAILY AT BEDTIME AS NEEDED 45 capsule 0  . tiZANidine (  ZANAFLEX) 4 MG tablet Take 1 tablet (4 mg total) by mouth at bedtime. TAKE 1 TABLET BY MOUTH EVERY 6 HOURS AS NEEDED DO  NOT  EXCEED  3  DOSES  IN  24  HOURS 30 tablet 3  . traMADol (ULTRAM) 50 MG tablet TAKE 1 TABLET BY MOUTH EVERY 12 HOURS AS NEEDED FOR ABDOMINAL PAIN 60 tablet 0  . traZODone (DESYREL) 100 MG tablet Take 1 tablet (100 mg total) by mouth at bedtime as needed for sleep. 90 tablet 0  . Vitamin D, Ergocalciferol, (DRISDOL) 1.25 MG (50000 UNIT) CAPS capsule      Current Facility-Administered Medications on File Prior to Visit  Medication Dose Route Frequency Provider Last Rate Last Admin  . ipratropium-albuterol (DUONEB) 0.5-2.5 (3) MG/3ML nebulizer solution 3 mL  3 mL Nebulization Once [name], MD       Past Medical History:  Diagnosis Date  . Asthma    daily inhalers  . Breast cancer ([place]) 05/2015   left  . COPD (chronic obstructive pulmonary disease) ([place])    denies SOB with ADLs; no O2  . Dental crowns present   . Depression   . History of concussion 01/01/2014  . History of kidney stones   . History of TIA (transient ischemic attack) 10/2014  . Hyperlipidemia   . Insomnia   . Migraine   . Nasal congestion 05/26/2015   will finish z-pak 05/27/2015  . Nonproductive cough 05/26/2015  . PONV (postoperative nausea and vomiting)    "long time ago"  none recently  . RLS (restless legs syndrome)   . Tobacco abuse   . Vitamin D deficiency   . Wears dentures    lower  . Wears partial dentures    upper   Past Surgical History:  Procedure Laterality Date  . ABDOMINAL HYSTERECTOMY    . APPENDECTOMY  2000  . BREAST BIOPSY Left   . BREAST RECONSTRUCTION WITH PLACEMENT OF TISSUE EXPANDER AND FLEX HD (ACELLULAR HYDRATED DERMIS) Bilateral 06/02/2015   Procedure: BILATERAL BREAST RECONSTRUCTION WITH PLACEMENT OF TISSUE EXPANDER AND  ACELLULAR HYDRATED DERMIS;  Surgeon: Irene Limbo, MD;  Location: [facility];  Service: Plastics;  Laterality: Bilateral;  . CARPAL TUNNEL RELEASE Bilateral   . CATARACT EXTRACTION Bilateral 09/2013  . KNEE ARTHROSCOPY Left   . 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 Perforation of small intestion requiring repair.   Marland Kitchen LOOP RECORDER INSERTION N/A 05/11/2016   Procedure: Loop Recorder Insertion;  Surgeon: Will Meredith Leeds, MD;  Location: [facility];  Service: Cardiovascular;  Laterality: N/A;  . MASTECTOMY W/ SENTINEL NODE BIOPSY Bilateral 06/02/2015   Procedure: BILATERAL MASTECTOMY WITH LEFT SENTINEL LYMPH NODE BIOPSY;  Surgeon: Stark Klein, MD;  Location: [facility];  Service: General;  Laterality:  Bilateral;  . NASAL SEPTUM SURGERY     x 3  . PLANTAR FASCIA SURGERY Left   . REMOVAL OF BILATERAL TISSUE EXPANDERS WITH PLACEMENT OF BILATERAL BREAST IMPLANTS Bilateral 06/17/2015   Procedure: DEBRIDEMENT OF BILATERAL MASTECTOMY FLAP WITH BILATERAL TISSUE EXPANDER EXCHANGE ;  Surgeon: Irene Limbo, MD;  Location: [facility];  Service: Plastics;  Laterality: Bilateral;  . REMOVAL OF TISSUE EXPANDER Bilateral 07/12/2015   Procedure: REMOVAL OF BILATERAL TISSUE EXPANDERS;  Surgeon: Irene Limbo, MD;  Location: [facility];  Service: Plastics;  Laterality: Bilateral;    Family History  Problem Relation Age of Onset  . Cervical  cancer Mother 73  . Colon cancer Maternal Grandfather        mets to stomach; dx. 67-70  . Heart disease Father   . Prostate cancer Father 90  . Cervical cancer Maternal Grandmother        dx. 42s; treated with radium implant  . Lung cancer Sister 42       maternal half-sister dx. lung cancer, stage III  . Breast cancer Maternal Aunt   . Cervical cancer Sister 62       paternal half-sister; s/p TAH  . Spina bifida Grandchild   . Brain cancer Grandchild        grandson dx. at 20 mos, treated at [facility]  . Breast cancer Other 66       maternal half-sister's daughter  . Cancer Maternal Aunt        d. 22; unspecified type - "began at back area and moved to vital organs"  . Cancer Maternal Uncle         late 64s; unspecified type; "started at back and moved to vital organs"   Social History   Socioeconomic History  . Marital status: Widowed    Spouse name: Not on file  . Number of children: 1  . Years of education: 32  . Highest education level: Not on file  Occupational History    Comment: [employer], retired  Tobacco Use  . Smoking status: Former Smoker    Packs/day: 0.25    Types: Cigarettes    Quit date: 03/21/2020    Years since quitting: 0.0  . Smokeless tobacco: Never Used  Substance and Sexual Activity  . Alcohol use: Yes     Alcohol/week: 0.0 standard drinks    Comment: rarely  . Drug use: No  . Sexual activity: Not on file  Other Topics Concern  . Not on file  Social History Narrative   Lives alone, widow   Caffeine use- tea, 1 cup daily   Social Determinants of Health   Financial Resource Strain: Not on file  Food Insecurity: Not on file  Transportation Needs: Not on file  Physical Activity: Not on file  Stress: Not on file  Social Connections: Not on file    Review of Systems  Constitutional: Positive for fatigue. Negative for chills and fever.  HENT: Positive for rhinorrhea. Negative for congestion, ear pain and sore throat.   Respiratory: Positive for cough and shortness of breath.   Cardiovascular: Negative for chest pain and palpitations.  Gastrointestinal: Positive for abdominal pain, nausea and vomiting (x 1 past Sunday. ). Negative for constipation and diarrhea.  Genitourinary: Negative for dysuria and urgency.  Musculoskeletal: Positive for back pain (Lower back hurts with abd pain). Negative for myalgias.  Neurological: Positive for tremors, light-headedness, numbness (BL fingers (mainly on rt hand and first 3 digits. and numbness on toes. BL. ) and headaches (2 x week, Last several hours, takes sumatriptan and hydrocodone.). Negative for dizziness and weakness.  Psychiatric/Behavioral: Positive for dysphoric mood and sleep disturbance (chronic. on numerous medicines over the years. Currently on trazodone and mirtazepine. ). The patient is nervous/anxious.      Objective:  BP 132/64   Pulse 100   Temp 98 F (36.7 C)   Resp 20   Ht 5' (1.524 m)   Wt 98 lb (44.5 kg)   BMI 19.14 kg/m   BP/Weight 04/14/2020 03/14/2020 16/60/6301  Systolic BP 601 093 235  Diastolic BP 64 40 70  Wt. (Lbs) 98 95.6  93  BMI 19.14 18.06 17.57    Physical Exam Vitals reviewed.  Constitutional:      Appearance: Normal appearance.  Neck:     Vascular: No carotid bruit.  Cardiovascular:     Rate  and Rhythm: Normal rate and regular rhythm.     Pulses: Normal pulses.     Heart sounds: Normal heart sounds.  Pulmonary:     Effort: Pulmonary effort is normal. No respiratory distress.     Breath sounds: Normal breath sounds.  Abdominal:     General: Abdomen is flat. Bowel sounds are normal.     Palpations: Abdomen is soft.     Tenderness: There is abdominal tenderness in the epigastric area and left lower quadrant. There is no right CVA tenderness, left CVA tenderness, guarding or rebound.  Neurological:     Mental Status: She is alert.     Comments: Positive phalens on rt. Negative phalen's on left.  Negative tinels BL  Psychiatric:        Behavior: Behavior normal.     Comments: Depressed, flat affect.      Diabetic Foot Exam - Simple   No data filed      Lab Results  Component Value Date   WBC 12.8 (H) 03/01/2020   HGB 12.5 03/01/2020   HCT 39.1 03/01/2020   PLT 289 03/01/2020   GLUCOSE 86 03/01/2020   CHOL 150 02/01/2020   TRIG 91 02/01/2020   HDL 59 02/01/2020   LDLCALC 74 02/01/2020   ALT 18 03/01/2020   AST 20 03/01/2020   NA 144 03/01/2020   K 3.9 03/01/2020   CL 104 03/01/2020   CREATININE 0.77 03/01/2020   BUN 14 03/01/2020   CO2 28 03/01/2020   TSH 1.320 03/01/2020      Assessment & Plan:   1. LLQ abdominal pain  2. Paresthesias  3. Right hand pain  4. Other insomnia  5. Moderate recurrent major depression ([place])  6. Other migraine without status migrainosus, not intractable    Meds ordered this encounter  Medications  . amitriptyline (ELAVIL) 75 MG tablet    Sig: Take 1 tablet (75 mg total) by mouth daily.    Dispense:  90 tablet    Refill:  1  . mirtazapine (REMERON) 45 MG tablet    Sig: Take 1 tablet (45 mg total) by mouth at bedtime.    Dispense:  90 tablet    Refill:  1    No orders of the defined types were placed in this encounter.     I spent < time > minutes dedicated to the care of this patient on the date of this  encounter to include face-to-face time with the patient, as well as: ***  Follow-up: Return in 4 weeks (on 05/12/2020) for fasting.  An After Visit Summary was printed and given to the patient.  [name], MD [facility] [phone]

## 2020-04-15 ENCOUNTER — Ambulatory Visit: Payer: BC Managed Care – PPO | Admitting: Family Medicine

## 2020-04-18 DIAGNOSIS — R413 Other amnesia: Secondary | ICD-10-CM | POA: Diagnosis not present

## 2020-04-18 DIAGNOSIS — J323 Chronic sphenoidal sinusitis: Secondary | ICD-10-CM | POA: Diagnosis not present

## 2020-04-18 DIAGNOSIS — R109 Unspecified abdominal pain: Secondary | ICD-10-CM | POA: Insufficient documentation

## 2020-04-18 DIAGNOSIS — Z85828 Personal history of other malignant neoplasm of skin: Secondary | ICD-10-CM | POA: Diagnosis not present

## 2020-04-18 DIAGNOSIS — R1032 Left lower quadrant pain: Secondary | ICD-10-CM | POA: Diagnosis not present

## 2020-04-18 DIAGNOSIS — Z85841 Personal history of malignant neoplasm of brain: Secondary | ICD-10-CM | POA: Diagnosis not present

## 2020-04-19 NOTE — Progress Notes (Signed)
HPI F smoker referred for sleep evaluation with concern of chronic insomnia, COPD  complicated by  Migraine, Carotid Artery Disease, Chronic Bronchitis/ COPD, Allergic Rhinitis, Tobacco Abuse, Restless Legs, Weight Loss, hx L Breast Cancer, R Lumbar Radiculopathy,   ======================================================  01/14/20- 74 yoF smoker referred for sleep evaluation with concern of chronic insomnia, courtesy of Dr Dirk Dress. Medical problem list includes Migraine, Carotid Artery Disease, Chronic Bronchitis/ COPD, Allergic Rhinitis, Tobacco Abuse, Restless Legs, Weight Loss, hx L Breast Cancer, R Lumbar Radiculopathy,  Meds include amitriptyline 75, Gabapentin 300 tid, Remeron 45, Temazepam 15, Zanaflex,  Norco,  Trazodone 100 hs prn sleep,  Trelegy 200, Ventolin hfa, benzonatate, Neb Duoneb(rarely used) Epworth score 2 Body weight today- 95 lbs Covid vax- 2 Moderna Flu vax- had Sleep-Her concern is difficulty initiating and maintaining sleep over the past year. Some nights 0-2-4 hours sleep. Says trazodone 150 mg had worked pretty well, but Dr Tobie Poet wanted her off it. Doesn't remember adverse effect. Above med list discussed. Doesn't remember other prescribed meds for sleep, insisting nothing otc helped.  Denies recognized reason for onset of insomnia. Widowed x 7 years, living alone next door to son's family but apparently not socially close.  Has lost 45 lbs in 2 years-"husband did the cooking. I just don't enjoy eating." Denies thyroid hx. Denies parasomnias. 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 Physically comfortable in bed.  Other- COPD- This is why she thought she was coming here and she would like our help. Active cough, scant phlegm worse since ran out of benzonatate. No blood. Tussive headaches. Had CXR and PFT this year at [facility].  Samples Breztri no better than Trelegy. Occ albuterol rescue inhaler, rarely uses neb. No O2., tessalon,   04/20/20- 74 yoF smoker  referred for sleep evaluation with concern of chronic Insomnia, COPD  complicated by  Migraine, Carotid Artery Disease, Chronic Bronchitis/ COPD, Allergic Rhinitis, Tobacco Abuse, Restless Legs, Weight Loss, hx L Breast Cancer, R Lumbar Radiculopathy,  Meds include amitriptyline 75, Gabapentin 300 tid, Remeron 45, Temazepam 15, Zanaflex,  Norco,  Trazodone 100 hs prn sleep,  Trelegy 200, Ventolin hfa, benzonatate, Neb Duoneb(rarely used) Covid vax- 3 Moderna Flu vax- had We had requested reports of PFT and CXR from [facility], getting her to sign release today. HST- ordered in November. We could never her reach her to set this up. She didn't return calls.  Out of her breathing meds. Comes today wheezing and coughing, but mainly uncomfortable with migraine. She continues to smoke against advice.  Says she was on 6 antibiotics between August and 2 weeks ago for sinus infection which finally resolved.  Denies coughing discolored sputum. Pending [facility] chest at [facility].   ROS-see HPI   + = positive Constitutional:    +weight loss, night sweats, fevers, chills, fatigue, lassitude. HEENT:   + headaches, difficulty swallowing, tooth/dental problems, sore throat,       sneezing, itching, ear ache, +nasal congestion, post nasal drip, snoring CV:    chest pain, orthopnea, PND, +swelling in lower extremities, anasarca,                                  dizziness, palpitations Resp:   +shortness of breath with exertion or at rest.               + productive cough,   non-productive cough, coughing up of blood.  change in color of mucus.  wheezing.   Skin:    rash or lesions. GI:  No-   heartburn, indigestion, +abdominal pain, nausea, vomiting, diarrhea,                 change in bowel habits,+ loss of appetite GU: dysuria, change in color of urine, no urgency or frequency.   flank pain. MS:   joint pain, stiffness, decreased range of motion, back pain. Neuro-     nothing unusual Psych:   change in mood or affect.  depression or anxiety.   memory loss.  OBJ- Physical Exam General- Alert, Oriented, Affect-appropriate, Distress- none acute, +petite/ slender Skin- rash-none, lesions- none, excoriation- none Lymphadenopathy- none Head- atraumatic            Eyes- Gross vision intact, PERRLA, conjunctivae and secretions clear            Ears- Hearing, canals-normal            Nose- Clear, no-Septal dev, mucus, polyps, erosion, perforation             Throat- Mallampati II , mucosa clear , drainage- none, tonsils- atrophic Neck- flexible , trachea midline, no stridor , thyroid nl, carotid no bruit Chest - symmetrical excursion , unlabored           Heart/CV- RRR , no murmur , no gallop  , no rub, nl s1 s2                           - JVD- none , edema- none, stasis changes- none, varices- none           Lung- clear to P&A/+ diminished, wheeze+, cough+ active , dullness-none, rub- none           Chest wall- + loop recorder L Abd-  Br/ Gen/ Rectal- Not done, not indicated Extrem- cyanosis- none, clubbing, none, atrophy- none, strength- nl Neuro- grossly intact to observation

## 2020-04-20 ENCOUNTER — Ambulatory Visit (INDEPENDENT_AMBULATORY_CARE_PROVIDER_SITE_OTHER): Payer: BC Managed Care – PPO | Admitting: Internal Medicine

## 2020-04-20 ENCOUNTER — Encounter: Payer: Self-pay | Admitting: Internal Medicine

## 2020-04-20 ENCOUNTER — Other Ambulatory Visit: Payer: Self-pay

## 2020-04-20 VITALS — BP 160/80 | HR 88 | Temp 97.6°F | Ht 61.0 in | Wt 99.4 lb

## 2020-04-20 DIAGNOSIS — Z720 Tobacco use: Secondary | ICD-10-CM | POA: Diagnosis not present

## 2020-04-20 DIAGNOSIS — J441 Chronic obstructive pulmonary disease with (acute) exacerbation: Secondary | ICD-10-CM | POA: Diagnosis not present

## 2020-04-20 DIAGNOSIS — G479 Sleep disorder, unspecified: Secondary | ICD-10-CM | POA: Diagnosis not present

## 2020-04-20 MED ORDER — METHYLPREDNISOLONE ACETATE 80 MG/ML IJ SUSP: 80.0000 mg | Freq: Once | INTRAMUSCULAR | Status: DC

## 2020-04-20 MED ORDER — TRELEGY ELLIPTA 200-62.5-25 MCG/INH IN AEPB
1.0000 | INHALATION_SPRAY | Freq: Every day | RESPIRATORY_TRACT | 12 refills | Status: DC
Start: 2020-04-20 — End: 2020-05-03

## 2020-04-20 MED ORDER — BENZONATATE 200 MG PO CAPS: 200.0000 mg | ORAL_CAPSULE | Freq: Three times a day (TID) | ORAL | 5 refills | Status: DC | PRN

## 2020-04-20 MED ORDER — TRELEGY ELLIPTA 200-62.5-25 MCG/INH IN AEPB: 1.0000 | INHALATION_SPRAY | Freq: Every day | RESPIRATORY_TRACT | 0 refills | Status: DC

## 2020-04-20 MED ORDER — ALBUTEROL SULFATE HFA 108 (90 BASE) MCG/ACT IN AERS: INHALATION_SPRAY | RESPIRATORY_TRACT | 12 refills | Status: DC

## 2020-04-20 NOTE — Patient Instructions (Addendum)
Order- depo 80  Dx exacerbation COPD  Script sent refilling  albuterol rescue inhaler, tessalon perles , Trelegy  Reschedule Home Sleep Test   Sign records release for results of CXR, CT chest and PFT from [place]- sample x 2 Trelegy 200   Inhale 1 puff then rinse mouth, once daily  Please call us name of [facility] home care company that supplies your nebulizer solution so we can renew that, or we can call a new company so you can have this medicine.  Please call if we can help

## 2020-04-20 NOTE — Addendum Note (Signed)
Addended by: Lia Foyer R on: 04/20/2020 09:55 AM   Modules accepted: Orders

## 2020-04-26 ENCOUNTER — Encounter: Payer: 59 | Admitting: Physician Assistant

## 2020-04-26 ENCOUNTER — Encounter: Payer: Self-pay | Admitting: Physician Assistant

## 2020-04-26 DIAGNOSIS — R1032 Left lower quadrant pain: Secondary | ICD-10-CM | POA: Diagnosis not present

## 2020-04-26 DIAGNOSIS — K5900 Constipation, unspecified: Secondary | ICD-10-CM | POA: Diagnosis not present

## 2020-04-26 DIAGNOSIS — N200 Calculus of kidney: Secondary | ICD-10-CM | POA: Diagnosis not present

## 2020-04-27 ENCOUNTER — Telehealth: Payer: Self-pay | Admitting: Internal Medicine

## 2020-04-27 MED ORDER — IPRATROPIUM-ALBUTEROL 0.5-2.5 (3) MG/3ML IN SOLN: 3.0000 mL | Freq: Four times a day (QID) | RESPIRATORY_TRACT | 12 refills | Status: DC | PRN

## 2020-04-27 NOTE — Telephone Encounter (Signed)
LMTCB

## 2020-04-27 NOTE — Telephone Encounter (Signed)
Ok yes please- either separate scripts for albuterol 0.083% and ipratropium neb solutions, or if they have it I would send Duoneb, which is simpler. Refill for a year. Thank you !

## 2020-04-27 NOTE — Telephone Encounter (Signed)
I called [facility] at the number the pt provided. Was advised that since she is not already in their system they will need rx and demographics on pt faxed to them at [phone]. Forwarding back to [place] triage to handle since I am not in the office today and CDY will need to sign the rx. Thanks!

## 2020-04-27 NOTE — Telephone Encounter (Signed)
Pt stated that she was told to inform us of the people who will be supplying her nebulizer (albuterol and ipriatropium); [employer]; [phone]. Pt stated that they tested for Covid yesterday but currently does have bronchitis and a sinus infection. [place] regard [phone]

## 2020-04-27 NOTE — Telephone Encounter (Signed)
Pt returning a phone call. Pt can be reached at [phone]

## 2020-04-27 NOTE — Telephone Encounter (Signed)
Called and spoke with the pt  She states needing orders for albuterol and ipratropium to be sent to [facility]  She states has machine and supplies, just needing neb sol  Dr Annamaria Boots is this okay with you?- nebs not on her current med list  She also wanted to let Dr Annamaria Boots know that she was seen 04/26/20 at [facility] and dx with bronchitis and sinus infection. She was prescribed doxy, pred taper and hydrocodone cough syrup. Rapid covid test was neg and the PCR still pending.

## 2020-04-27 NOTE — Telephone Encounter (Signed)
Script printed and faxed.   Nothing further needed at this time.

## 2020-04-28 ENCOUNTER — Other Ambulatory Visit: Payer: Self-pay

## 2020-04-28 ENCOUNTER — Telehealth: Payer: Self-pay | Admitting: Internal Medicine

## 2020-04-28 ENCOUNTER — Ambulatory Visit (INDEPENDENT_AMBULATORY_CARE_PROVIDER_SITE_OTHER): Payer: BC Managed Care – PPO | Admitting: Family Medicine

## 2020-04-28 VITALS — BP 140/60 | HR 116 | Temp 98.1°F | Resp 18 | Ht 61.0 in | Wt 99.0 lb

## 2020-04-28 DIAGNOSIS — R300 Dysuria: Secondary | ICD-10-CM

## 2020-04-28 LAB — POCT URINALYSIS DIPSTICK
Bilirubin, UA: NEGATIVE
Blood, UA: NEGATIVE
Glucose, UA: POSITIVE — AB
Ketones, UA: NEGATIVE
Leukocytes, UA: NEGATIVE
Nitrite, UA: NEGATIVE
Protein, UA: POSITIVE — AB
Spec Grav, UA: >=1.03 — AB (ref 1.010–1.025)
Urobilinogen, UA: 0.2 E.U./dL
pH, UA: 5.5 (ref 5.0–8.0)

## 2020-04-28 NOTE — Progress Notes (Addendum)
Pt left without being seen.  BP 140/60   Pulse (!) 116   Temp 98.1 F (36.7 C)   Resp 18   Ht 5\' 1"  (1.549 m)   Wt 99 lb (44.9 kg)   BMI 18.71 kg/m  Dysuria - urinalysis

## 2020-04-28 NOTE — Telephone Encounter (Signed)
Called and spoke with Larkin Ina with [facility] and he was given the DX code J44.1 for the pts duoneb.  Nothing further is needed.

## 2020-04-29 ENCOUNTER — Telehealth: Payer: Self-pay

## 2020-04-29 NOTE — Telephone Encounter (Signed)
Message left for Anne [name] to call back about referral to [facility].

## 2020-05-03 ENCOUNTER — Encounter: Payer: Self-pay | Admitting: Family Medicine

## 2020-05-03 ENCOUNTER — Other Ambulatory Visit: Payer: Self-pay

## 2020-05-03 ENCOUNTER — Encounter: Payer: Self-pay | Admitting: Nurse Practitioner

## 2020-05-03 ENCOUNTER — Telehealth: Payer: Self-pay

## 2020-05-03 ENCOUNTER — Ambulatory Visit (INDEPENDENT_AMBULATORY_CARE_PROVIDER_SITE_OTHER): Payer: BC Managed Care – PPO | Admitting: Family Medicine

## 2020-05-03 VITALS — BP 146/60 | HR 88 | Temp 97.7°F | Resp 20 | Wt 92.0 lb

## 2020-05-03 DIAGNOSIS — C50812 Malignant neoplasm of overlapping sites of left female breast: Secondary | ICD-10-CM

## 2020-05-03 DIAGNOSIS — F331 Major depressive disorder, recurrent, moderate: Secondary | ICD-10-CM | POA: Diagnosis not present

## 2020-05-03 DIAGNOSIS — E559 Vitamin D deficiency, unspecified: Secondary | ICD-10-CM

## 2020-05-03 DIAGNOSIS — F5101 Primary insomnia: Secondary | ICD-10-CM

## 2020-05-03 DIAGNOSIS — G43709 Chronic migraine without aura, not intractable, without status migrainosus: Secondary | ICD-10-CM | POA: Diagnosis not present

## 2020-05-03 DIAGNOSIS — R251 Tremor, unspecified: Secondary | ICD-10-CM

## 2020-05-03 DIAGNOSIS — R1012 Left upper quadrant pain: Secondary | ICD-10-CM

## 2020-05-03 DIAGNOSIS — J449 Chronic obstructive pulmonary disease, unspecified: Secondary | ICD-10-CM

## 2020-05-03 NOTE — Telephone Encounter (Signed)
Left message for patient informing her that she has an appointment on 05/17/2020 at 11 a.m. with Tye Savoy, NP.   Patient requested to be scheduled with Dr. Crisoforo [place] office unfortunately, patient has a outstanding bill from 2016 that must be addressed in full prior to an appt being scheduled.

## 2020-05-03 NOTE — Patient Instructions (Signed)
Hold hydrocodone/apap. Hold tramadol. Hold methocarbamol. Hold tizanidine.

## 2020-05-03 NOTE — Progress Notes (Signed)
Acute Office Visit  Subjective:    Patient ID: Anne [name], female    DOB: 1945-10-01, 75 y.o.   MRN: [id number]  Chief Complaint  Patient presents with  . Abdominal Pain  . Cough    HPI Patient is in today for  Abdominal Pain x several months, but is worsening: Left sided radiating to the left back. Pain is constant. Exacerbated by activity/bending. Not worsened by eating, but pt has no appetite. No bladder symptoms. No nausea/vomiting. BMs: every 2 hours had soft/loose stools on Saturday, Sunday improved. Had 2 bms. Prior to this weekend pt was constipated. Usually stools are very hard. Patient was seen by Dr. Noberto Retort, as I was concerned patient might have adhesions from her extensive abdominal surgery in 2019 (LAPAROSCOPIC LYSIS OF CONGENITAL ADHESIVE BAND/LADD'S BANDS WIH INTRAOPERATIVE CHOLANGIOGRAPHY. Perforation of small intestion requiring repair.)  She had a ct abdomen/pelvis: 4 mm intrarenal nephrolithisis. Dr. Noberto Retort sent her to Dr. Nila Nephew, but Dr. Nila Nephew told her it was not the 4 mm intrarenal nephrolithiasis causing her pain which I agree with. Pt has seen Dr. Althea Charon in the past and would like to go back to him, but when we attempted referral she has an outstanding balance. I had tried to refer her to [facility] in the fall of 2021, but she was too busy working to go. Her pain has increased significantly since then.  Hydrocodone/APAP: three times a day for abdominal pain which is not helping. This was originally given for severe headaches. Tramadol 50 mg once daily abdominal pain, but not helping.  Taking methocarbamol 500 mg 2 daily and tizanidine once at bedtime.   COPD: Same. DOE. Cough has improved. Trelegy one inhalation daily. Uses albuterol MDI twice a day. Completing doxycycline.   Tremor: on primidone 50 mg one twice a day.  Migraines 2-3 times a week. Imitrex helps, but has to take hydrocodone.   Insomnia: On trazodone 100 mg once at night and mirtazepine ,  temazepam 15 mg once at night. Averages 4-5 hours per night.   Depression: on Venlafaxine xr 75 mg 3 in am.   Vitamin D deficiency: On vitamin D 50000 once weekly   Breast Cancer: on tamoxifen. 5 years since dx.   Past Medical History:  Diagnosis Date  . Asthma    daily inhalers  . Breast cancer ([place]) 05/2015   left  . COPD (chronic obstructive pulmonary disease) ([place])    denies SOB with ADLs; no O2  . Dental crowns present   . Depression   . History of concussion 01/01/2014  . History of kidney stones   . History of TIA (transient ischemic attack) 10/2014  . Hyperlipidemia   . Insomnia   . Migraine   . Nasal congestion 05/26/2015   will finish z-pak 05/27/2015  . Nonproductive cough 05/26/2015  . PONV (postoperative nausea and vomiting)    "long time ago"  none recently  . RLS (restless legs syndrome)   . Tobacco abuse   . Vitamin D deficiency   . Wears dentures    lower  . Wears partial dentures    upper    Past Surgical History:  Procedure Laterality Date  . ABDOMINAL HYSTERECTOMY    . APPENDECTOMY  2000  . BREAST BIOPSY Left   . BREAST RECONSTRUCTION WITH PLACEMENT OF TISSUE EXPANDER AND FLEX HD (ACELLULAR HYDRATED DERMIS) Bilateral 06/02/2015   Procedure: BILATERAL BREAST RECONSTRUCTION WITH PLACEMENT OF TISSUE EXPANDER AND  ACELLULAR HYDRATED DERMIS;  Surgeon: Arnoldo Hooker  Iran Planas, MD;  Location: [place];  Service: Plastics;  Laterality: Bilateral;  . CARPAL TUNNEL RELEASE Bilateral   . CATARACT EXTRACTION Bilateral 09/2013  . KNEE ARTHROSCOPY Left   . 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 Perforation of small intestion requiring repair.   Marland Kitchen LOOP RECORDER INSERTION N/A 05/11/2016   Procedure: Loop Recorder Insertion;  Surgeon: Will Meredith Leeds, MD;  Location: [facility];  Service: Cardiovascular;  Laterality: N/A;  . MASTECTOMY W/  SENTINEL NODE BIOPSY Bilateral 06/02/2015   Procedure: BILATERAL MASTECTOMY WITH LEFT SENTINEL LYMPH NODE BIOPSY;  Surgeon: Stark Klein, MD;  Location: [place];  Service: General;  Laterality: Bilateral;  . NASAL SEPTUM SURGERY     x 3  . PLANTAR FASCIA SURGERY Left   . REMOVAL OF BILATERAL TISSUE EXPANDERS WITH PLACEMENT OF BILATERAL BREAST IMPLANTS Bilateral 06/17/2015   Procedure: DEBRIDEMENT OF BILATERAL MASTECTOMY FLAP WITH BILATERAL TISSUE EXPANDER EXCHANGE ;  Surgeon: Irene Limbo, MD;  Location: [place];  Service: Plastics;  Laterality: Bilateral;  . REMOVAL OF TISSUE EXPANDER Bilateral 07/12/2015   Procedure: REMOVAL OF BILATERAL TISSUE EXPANDERS;  Surgeon: Irene Limbo, MD;  Location: [place];  Service: Plastics;  Laterality: Bilateral;    Family History  Problem Relation Age of Onset  . Cervical cancer Mother 50  . Colon cancer Maternal Grandfather        mets to stomach; dx. 67-70  . Heart disease Father   . Prostate cancer Father 18  . Cervical cancer Maternal Grandmother        dx. 39s; treated with radium implant  . Lung cancer Sister 18       maternal half-sister dx. lung cancer, stage III  . Breast cancer Maternal Aunt   . Cervical cancer Sister 10       paternal half-sister; s/p TAH  . Spina bifida Grandchild   . Brain cancer Grandchild        grandson dx. at 20 mos, [facility] at [facility]  . Breast cancer Other 47       maternal half-sister's daughter  . Cancer Maternal Aunt        d. 70; unspecified type - "began at back area and moved to vital organs"  . Cancer Maternal Uncle         late 44s; unspecified type; "started at back and moved to vital organs"    Social History   Socioeconomic History  . Marital status: Widowed    Spouse name: Not on file  . Number of children: 1  . Years of education: 60  . Highest education level: Not on file  Occupational History    Comment: [employer], retired  Tobacco Use  .  Smoking status: Former Smoker    Packs/day: 0.25    Types: Cigarettes    Quit date: 03/21/2020    Years since quitting: 0.1  . Smokeless tobacco: Never Used  Vaping Use  . Vaping Use: Never used  Substance and Sexual Activity  . Alcohol use: Yes    Alcohol/week: 0.0 standard drinks    Comment: rarely  . Drug use: No  . Sexual activity: Not on file  Other Topics Concern  . Not on file  Social History Narrative   Lives alone, widow   Caffeine use- tea, 1 cup daily   Social Determinants of Health   Financial Resource Strain: Not on file  Food  Insecurity: Not on file  Transportation Needs: Not on file  Physical Activity: Not on file  Stress: Not on file  Social Connections: Not on file  Intimate Partner Violence: Not on file    Outpatient Medications Prior to Visit  Medication Sig Dispense Refill  . amitriptyline (ELAVIL) 75 MG tablet Take 1 tablet (75 mg total) by mouth daily. 90 tablet 1  . atorvastatin (LIPITOR) 20 MG tablet Take 1 tablet (20 mg total) by mouth daily. 90 tablet 0  . cetirizine (ZYRTEC) 10 MG tablet Take 1 tablet (10 mg total) by mouth daily. 30 tablet 11  . Cholecalciferol (VITAMIN D3) 50000 UNITS CAPS Take 50,000 Units by mouth once a week.     . doxycycline (VIBRAMYCIN) 100 MG capsule Take 100 mg by mouth 2 (two) times daily.    . fenofibrate 160 MG tablet Take 1 tablet (160 mg total) by mouth daily. 90 tablet 0  . Fluticasone-Umeclidin-Vilant (TRELEGY ELLIPTA) 200-62.5-25 MCG/INH AEPB Inhale 1 puff into the lungs daily. 28 each 0  . furosemide (LASIX) 20 MG tablet Take 20 mg by mouth daily as needed for edema (for leg swelling).     Marland Kitchen ipratropium-albuterol (DUONEB) 0.5-2.5 (3) MG/3ML SOLN Take 3 mLs by nebulization every 6 (six) hours as needed. 360 mL 12  . mirtazapine (REMERON) 45 MG tablet Take 1 tablet (45 mg total) by mouth at bedtime. 90 tablet 1  . potassium chloride SA (KLOR-CON) 20 MEQ tablet TAKE 1 TABLET BY MOUTH ONCE DAILY WITH FOOD    .  primidone (MYSOLINE) 50 MG tablet TAKE 1 TABLET BY MOUTH IN THE MORNING AND 1 AT BEDTIME 60 tablet 5  . promethazine (PHENERGAN) 25 MG tablet Take 1 tablet by mouth as needed for nausea/vomiting.    . tamoxifen (NOLVADEX) 10 MG tablet Take 2 tablets by mouth once daily 90 tablet 3  . temazepam (RESTORIL) 15 MG capsule TAKE 1 CAPSULE BY MOUTH ONCE DAILY AT BEDTIME AS NEEDED 45 capsule 0  . venlafaxine XR (EFFEXOR-XR) 75 MG 24 hr capsule TAKE 3 CAPSULES BY MOUTH ONCE DAILY 270 capsule 0  . Vitamin D, Ergocalciferol, (DRISDOL) 1.25 MG (50000 UNIT) CAPS capsule     . albuterol (VENTOLIN HFA) 108 (90 Base) MCG/ACT inhaler INHALE 1 TO 2 PUFFS BY MOUTH EVERY 6 HOURS AS NEEDED FOR WHEEZING FOR SHORTNESS OF BREATH 18 g 12  . benzonatate (TESSALON) 200 MG capsule Take 1 capsule (200 mg total) by mouth 3 (three) times daily as needed for cough. 30 capsule 5  . Fluticasone-Umeclidin-Vilant (TRELEGY ELLIPTA) 200-62.5-25 MCG/INH AEPB Inhale 1 puff into the lungs daily. 1 each 12  . gabapentin (NEURONTIN) 300 MG capsule TAKE 1 CAPSULE BY MOUTH THREE TIMES DAILY 90 capsule 3  . HYDROcodone-acetaminophen (NORCO/VICODIN) 5-325 MG tablet Take 1 tablet by mouth 2 (two) times daily as needed for severe pain. 60 tablet 0  . methocarbamol (ROBAXIN) 500 MG tablet Take by mouth.    Marland Kitchen PARoxetine (PAXIL) 20 MG tablet Take 1 tablet by mouth once daily 30 tablet 0  . SUMAtriptan (IMITREX) 100 MG tablet TAKE 1 TABLET BY MOUTH AT ONSET OF HEADACHE THEN MAY REPEAT IN 2 HOURS, NOT TO EXCEED 2 TABLETS IN 24 HOURS 9 tablet 0  . tiZANidine (ZANAFLEX) 4 MG tablet Take 1 tablet (4 mg total) by mouth at bedtime. TAKE 1 TABLET BY MOUTH EVERY 6 HOURS AS NEEDED DO  NOT  EXCEED  3  DOSES  IN  24  HOURS 30 tablet 3  .  traMADol (ULTRAM) 50 MG tablet TAKE 1 TABLET BY MOUTH EVERY 12 HOURS AS NEEDED FOR ABDOMINAL PAIN 60 tablet 0  . traZODone (DESYREL) 100 MG tablet Take 1 tablet (100 mg total) by mouth at bedtime as needed for sleep. 90 tablet 0    Facility-Administered Medications Prior to Visit  Medication Dose Route Frequency Provider Last Rate Last Admin  . methylPREDNISolone acetate (DEPO-MEDROL) injection 80 mg  80 mg Intramuscular Once Deneise Lever, MD        Allergies  Allergen Reactions  . Rocephin [Ceftriaxone Sodium In Dextrose] Shortness Of Breath and Rash  . Clindamycin/Lincomycin Nausea And Vomiting  . Lyrica [Pregabalin] Other (See Comments)    PERSONALITY CHANGE    Review of Systems  Constitutional: Positive for fatigue. Negative for chills and fever.  HENT: Positive for congestion and rhinorrhea. Negative for ear pain and sore throat.   Respiratory: Positive for cough, chest tightness and shortness of breath.   Cardiovascular: Negative for chest pain.  Gastrointestinal: Positive for abdominal pain, constipation and diarrhea. Negative for nausea and vomiting.  Genitourinary: Negative for dysuria and urgency.  Musculoskeletal: Positive for back pain (left flank). Negative for myalgias.  Neurological: Positive for dizziness (when gets up from kneeling position.) and headaches. Negative for weakness and light-headedness.  Psychiatric/Behavioral: Positive for dysphoric mood. The patient is nervous/anxious.       Objective:    Physical Exam Vitals reviewed.  Constitutional:      Comments: thin  Cardiovascular:     Rate and Rhythm: Normal rate and regular rhythm.     Heart sounds: Normal heart sounds.  Pulmonary:     Effort: Pulmonary effort is normal.     Breath sounds: Normal breath sounds.  Abdominal:     General: Bowel sounds are normal.     Palpations: Abdomen is soft.     Tenderness: There is abdominal tenderness in the left upper quadrant and left lower quadrant. There is left CVA tenderness and guarding. There is no rebound.     Hernia: There is no hernia in the umbilical area or ventral area.  Neurological:     Mental Status: She is alert and oriented to person, place, and time.   Psychiatric:        Mood and Affect: Mood normal.        Behavior: Behavior normal.     BP (!) 146/60   Pulse 88   Temp 97.7 F (36.5 C)   Resp 20   Wt 92 lb (41.7 kg)   BMI 17.38 kg/m  Wt Readings from Last 3 Encounters:  05/12/20 98 lb (44.5 kg)  05/03/20 92 lb (41.7 kg)  04/28/20 99 lb (44.9 kg)    Health Maintenance Due  Topic Date Due  . Hepatitis C Screening  Never done  . MAMMOGRAM  02/17/2017    There are no preventive care reminders to display for this patient.   Lab Results  Component Value Date   TSH 1.320 03/01/2020   Lab Results  Component Value Date   WBC 9.5 05/05/2020   HGB 11.8 05/05/2020   HCT 36.7 05/05/2020   MCV 87 05/05/2020   PLT 263 05/05/2020   Lab Results  Component Value Date   NA 139 05/05/2020   K 5.2 05/05/2020   CO2 23 05/05/2020   GLUCOSE 86 05/05/2020   BUN 35 (H) 05/05/2020   CREATININE 0.88 05/05/2020   BILITOT 0.4 05/05/2020   ALKPHOS 139 (H) 05/05/2020   AST 25 05/05/2020  ALT 26 05/05/2020   PROT 6.0 05/05/2020   ALBUMIN 3.4 (L) 05/05/2020   CALCIUM 8.7 05/05/2020   ANIONGAP 3 (L) 07/03/2016   Lab Results  Component Value Date   CHOL 150 02/01/2020   Lab Results  Component Value Date   HDL 59 02/01/2020   Lab Results  Component Value Date   LDLCALC 74 02/01/2020   Lab Results  Component Value Date   TRIG 91 02/01/2020   Lab Results  Component Value Date   CHOLHDL 2.5 02/01/2020   No results found for: HGBA1C     Assessment & Plan:  1. LUQ abdominal pain Patient to come back in am for lab work.  Trial of GI cocktail.  Start on omeprazole 40 mg twice daily. Hold hydrocodone/apap. Hold tramadol. Hold methocarbamol. Hold tizanidine.  May use tylenol extra strength.  - CBC with Differential/Platelet; Future - Comprehensive metabolic panel; Future - Amylase; Future - Lipase; Future - H. pylori breath test; Future  2. COPD mixed type [facility]) The current medical regimen is effective;   continue present plan and medications.  3. Tremor Not at goal.  Continue primidone at current dose for now.  4. Chronic migraine without aura without status migrainosus, not intractable Continue amitripyline.  Ubrelvy 100 mg 1/2-1 pill one hour prior to migraine. May still take imitrex.  5. Primary insomnia The current medical regimen is effective;  continue present plan and medications.  6. Moderate episode of recurrent major depressive disorder (HCC) The current medical regimen is effective;  continue present plan and medications.  7. Vitamin D deficiency Continue vitamin D 50000 weekly.   8. Malignant neoplasm of overlapping sites of left female breast, unspecified estrogen receptor status ([place])  Continue tamoxifen. Follow up with oncology.     Orders Placed This Encounter  Procedures  . CBC with Differential/Platelet  . Comprehensive metabolic panel  . Amylase  . Lipase  . H. pylori breath test     I spent 45 minutes dedicated to the care of this patient on the date of this encounter to include face-to-face time with the patient, as well as: review of labs, specialists visits, and imaging.  Follow-up: Return if symptoms worsen or fail to improve.  An After Visit Summary was printed and given to the patient.  [name], MD [facility] [phone]

## 2020-05-04 ENCOUNTER — Telehealth: Payer: Self-pay

## 2020-05-04 ENCOUNTER — Telehealth: Payer: Self-pay | Admitting: Family Medicine

## 2020-05-04 NOTE — Telephone Encounter (Addendum)
I called 911 back and they had the officer call me. He stated that they met her at the door as she was leaving for work. She was disgruntled, but alive. She emailed [name] also and said she was not going to take tylenol as it could adversely affect her liver, however, she was taking hydrocodone/apap 3 times a day which continues to get her tylenol.

## 2020-05-04 NOTE — Telephone Encounter (Signed)
In the message the patient stated she was going to shoot herself in the head if she did not get out of pain. We immediately called her home and cell phone this morning when we heard messages. I had, in fact, called her at 7 am prior to receiving her messages to have her return for testing first thing this morning and to try a new medicine (GI cocktail, PPI.)  I was unable to reach her and left her a message on her cell and house phones. My nurse called Dr. Crisoforo Oxford office as the patient stated on the message she was going to call him. They received a message last night that per his office was not threatening, saying she wanted to work things out with him (she has an Mudlogger.) They had not spoken with the patient.  I called 911 at around 9:30 am to go and check on patient.

## 2020-05-04 NOTE — Telephone Encounter (Signed)
Anne [name] left a message at 9:04 pm that she was in extreme pain and that she was very upset that Dr. Tobie Poet was stopping her pain medication and the Imitrex.  She also said that she was going to find a way to take care of her pain if no one was willing to help.     Brandan called back at 10:57 pm and left a message to cancel her appointment with [facility] and she was going to work it out with Dr. Lyda Jester.

## 2020-05-05 ENCOUNTER — Other Ambulatory Visit: Payer: Self-pay

## 2020-05-05 ENCOUNTER — Telehealth: Payer: Self-pay

## 2020-05-05 DIAGNOSIS — R1012 Left upper quadrant pain: Secondary | ICD-10-CM

## 2020-05-05 MED ORDER — GABAPENTIN 300 MG PO CAPS: 600.0000 mg | ORAL_CAPSULE | Freq: Three times a day (TID) | ORAL | 1 refills | Status: DC

## 2020-05-05 MED ORDER — OMEPRAZOLE 40 MG PO CPDR: 40.0000 mg | DELAYED_RELEASE_CAPSULE | Freq: Two times a day (BID) | ORAL | 1 refills | Status: DC

## 2020-05-05 NOTE — Telephone Encounter (Signed)
Pt called this morning wanting to know if Dr. Tobie Poet is going to call in a RX to [place] for the [place]. Pt was given a sample to try. She has been taking imitrex and either tramadol or hydrocodone if really bad headache. I did not tell her she could not take imitrex.  I was trying to give her an additional medicine to bridge her since I was stopping her tramadol and hydrocodone.  I can try to get ubrelvy approved, but it is likely very expensive. If we have more samples give her some.     Keep appointment for labwork and GI cocktail tomorrow. Kc

## 2020-05-05 NOTE — Telephone Encounter (Addendum)
[name] came by the office and had her labs and the H. Pylori breath tek test.  She was then given a GI cocktail with some improvement.  She also was given samples of ubrelvy 50 mg with instructions.  She has not been able to schedule the appointment with Dr. Pleas Patricia but she refused the referral to [place].  She is complaining of headaches and back pain today and she wants her hydrocodone back.     Dr. Alyse Low note 2 PM  Pt may start on gabapentin 300 mg one three times a day for back pain. This may help her headaches also.  I referred her to [facility] in 05/2019, but they were never able to reach her per their notes. She needs to see the specialist.   I would recommend pt start on omeprazole 40 mg one twice a day. Encourage to see GI for her abdominal pain.  Monitor her BMs to see if bowels improve and her abdominal pain.  I do not recommend restarting hydrocodone or tramadol.  If she feels she needs to take a muscle relaxant, I would only take methocarbamol instead of tizanidine.

## 2020-05-05 NOTE — Telephone Encounter (Signed)
Anne [name] called to report that she is taking the gabapentin 300 mg tid. Dr.Cox advised that she increase the medication to 600 mg tid and she is willing to take omeprazole twice daily.  Rx sent to pharmacy.

## 2020-05-05 NOTE — Telephone Encounter (Signed)
Left detailed message.  Patient to call back about starting new medications.  She was encouraged to reach out to Neurology for an appointment and also that we needed to schedule her with GI ASAP.

## 2020-05-05 NOTE — Telephone Encounter (Signed)
Pt called this morning wanting to know if Dr. Tobie Poet is going to call in a RX to [facility] for the [place]. (a message was sent to the provider and her two nurses)

## 2020-05-06 LAB — CBC WITH DIFFERENTIAL/PLATELET
Basophils Absolute: 0.1 10*3/uL (ref 0.0–0.2)
Basos: 1 %
EOS (ABSOLUTE): 0.1 10*3/uL (ref 0.0–0.4)
Eos: 1 %
Hematocrit: 36.7 % (ref 34.0–46.6)
Hemoglobin: 11.8 g/dL (ref 11.1–15.9)
Immature Grans (Abs): 0.1 10*3/uL (ref 0.0–0.1)
Immature Granulocytes: 1 %
Lymphocytes Absolute: 2.4 10*3/uL (ref 0.7–3.1)
Lymphs: 25 %
MCH: 27.9 pg (ref 26.6–33.0)
MCHC: 32.2 g/dL (ref 31.5–35.7)
MCV: 87 fL (ref 79–97)
Monocytes Absolute: 1.1 10*3/uL — ABNORMAL HIGH (ref 0.1–0.9)
Monocytes: 12 %
Neutrophils Absolute: 5.8 10*3/uL (ref 1.4–7.0)
Neutrophils: 60 %
Platelets: 263 10*3/uL (ref 150–450)
RBC: 4.23 x10E6/uL (ref 3.77–5.28)
RDW: 13.6 % (ref 11.7–15.4)
WBC: 9.5 10*3/uL (ref 3.4–10.8)

## 2020-05-06 LAB — COMPREHENSIVE METABOLIC PANEL
ALT: 26 IU/L (ref 0–32)
AST: 25 IU/L (ref 0–40)
Albumin/Globulin Ratio: 1.3 (ref 1.2–2.2)
Albumin: 3.4 g/dL — ABNORMAL LOW (ref 3.7–4.7)
Alkaline Phosphatase: 139 IU/L — ABNORMAL HIGH (ref 44–121)
BUN/Creatinine Ratio: 40 — ABNORMAL HIGH (ref 12–28)
BUN: 35 mg/dL — ABNORMAL HIGH (ref 8–27)
Bilirubin Total: 0.4 mg/dL (ref 0.0–1.2)
CO2: 23 mmol/L (ref 20–29)
Calcium: 8.7 mg/dL (ref 8.7–10.3)
Chloride: 104 mmol/L (ref 96–106)
Creatinine, Ser: 0.88 mg/dL (ref 0.57–1.00)
Globulin, Total: 2.6 g/dL (ref 1.5–4.5)
Glucose: 86 mg/dL (ref 65–99)
Potassium: 5.2 mmol/L (ref 3.5–5.2)
Sodium: 139 mmol/L (ref 134–144)
Total Protein: 6 g/dL (ref 6.0–8.5)
eGFR: 69 mL/min/{1.73_m2} (ref >59)

## 2020-05-06 LAB — LIPASE: Lipase: 26 U/L (ref 14–85)

## 2020-05-06 LAB — AMYLASE: Amylase: 52 U/L (ref 31–110)

## 2020-05-06 LAB — H. PYLORI BREATH TEST: H pylori Breath Test: NEGATIVE

## 2020-05-09 ENCOUNTER — Other Ambulatory Visit: Payer: Self-pay

## 2020-05-09 ENCOUNTER — Telehealth: Payer: Self-pay

## 2020-05-09 ENCOUNTER — Other Ambulatory Visit: Payer: Self-pay | Admitting: Family Medicine

## 2020-05-09 MED ORDER — RIZATRIPTAN BENZOATE 10 MG PO TBDP: ORAL_TABLET | ORAL | 2 refills | Status: DC

## 2020-05-09 MED ORDER — ALBUTEROL SULFATE HFA 108 (90 BASE) MCG/ACT IN AERS: INHALATION_SPRAY | RESPIRATORY_TRACT | 12 refills | Status: DC

## 2020-05-09 NOTE — Telephone Encounter (Signed)
[name] called to report that the Anne [name] was going to be $100 and was too expensive.  Dr. Tobie Poet advised that she stop the Imitrex and begin Maxalt as instructed per Dr Tobie Poet.

## 2020-05-10 ENCOUNTER — Ambulatory Visit: Payer: BC Managed Care – PPO | Admitting: Gastroenterology

## 2020-05-11 NOTE — Progress Notes (Signed)
Subjective:  Patient ID: Anne [name], female    DOB: 1946/02/17  Age: 74 y.o. MRN: [id number]  Chief Complaint  Patient presents with  . Hypertension    HPI COPD mixed type ([place]) Has duoneb, trelegy and ventolin  Primary insomnia - on temazepam 15 mg once at night, mirtazepine 45 mg once at night,   Mixed hyperlipidemia Taking lipitor and fenofibrate  Major depressive disorder, recurrent episode, mild ([place]) Takes effexor xr 75 mg 3 in am. Depression is  Abdominal pain:  BMs: stool is very hard to pass. Having BMs daily.  Abdominal pain improved with discontinuing hdyrocodone, tramadol.  Migraines:  Imitrex helps some when taken with hydrocodone. Since I had her hold her hydrocodone/apap, her migraines have worsened. I gave her Roselyn Meier which worked great, but it is too expensive. Has migraines every 3 days. I sent maxlat which is working better than imitrex, but not than ubrelvy. The patient has failed on topamax and Inderal LA. Currently on amitriptyline, but still not controlled.   Back pain: worsened since I held hydrocodone and tramadol. Methocarbamol, tizanidine, and flexeril have not helped. Requesting alternative muscle relaxant. Increase in gabapentin to 600 mg one three times a day.   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 She will need to be transferred from chair to bed.    Current Outpatient Medications on File Prior to Visit  Medication Sig Dispense Refill  . rizatriptan (MAXALT-MLT) 10 MG disintegrating tablet Take at onset of migraines, May repeat in 2 hours if not resolved 10 tablet 2  . albuterol (VENTOLIN HFA) 108 (90 Base) MCG/ACT inhaler INHALE 1 TO 2 PUFFS BY MOUTH EVERY 6 HOURS AS NEEDED FOR WHEEZING FOR SHORTNESS OF BREATH 18 g 12  . amitriptyline (ELAVIL) 75 MG tablet Take 1 tablet (75 mg total) by mouth daily. 90 tablet 1  . atorvastatin (LIPITOR) 20 MG tablet Take 1 tablet (20 mg total) by mouth daily. 90 tablet 0  .  cetirizine (ZYRTEC) 10 MG tablet Take 1 tablet (10 mg total) by mouth daily. 30 tablet 11  . Cholecalciferol (VITAMIN D3) 50000 UNITS CAPS Take 50,000 Units by mouth once a week.     . fenofibrate 160 MG tablet Take 1 tablet (160 mg total) by mouth daily. 90 tablet 0  . Fluticasone-Umeclidin-Vilant (TRELEGY ELLIPTA) 200-62.5-25 MCG/INH AEPB Inhale 1 puff into the lungs daily. 28 each 0  . furosemide (LASIX) 20 MG tablet Take 20 mg by mouth daily as needed for edema (for leg swelling).     . gabapentin (NEURONTIN) 300 MG capsule Take 2 capsules (600 mg total) by mouth 3 (three) times daily. 180 capsule 1  . ipratropium-albuterol (DUONEB) 0.5-2.5 (3) MG/3ML SOLN Take 3 mLs by nebulization every 6 (six) hours as needed. 360 mL 12  . mirtazapine (REMERON) 45 MG tablet Take 1 tablet (45 mg total) by mouth at bedtime. 90 tablet 1  . omeprazole (PRILOSEC) 40 MG capsule Take 1 capsule (40 mg total) by mouth 2 (two) times daily. 60 capsule 1  . potassium chloride SA (KLOR-CON) 20 MEQ tablet TAKE 1 TABLET BY MOUTH ONCE DAILY WITH FOOD    . primidone (MYSOLINE) 50 MG tablet TAKE 1 TABLET BY MOUTH IN THE MORNING AND 1 AT BEDTIME 60 tablet 5  . promethazine (PHENERGAN) 25 MG tablet Take 1 tablet by mouth as needed for nausea/vomiting.    . tamoxifen (NOLVADEX) 10 MG tablet Take 2 tablets by mouth once daily 90 tablet  3  . temazepam (RESTORIL) 15 MG capsule TAKE 1 CAPSULE BY MOUTH ONCE DAILY AT BEDTIME AS NEEDED 45 capsule 0  . venlafaxine XR (EFFEXOR-XR) 75 MG 24 hr capsule TAKE 3 CAPSULES BY MOUTH ONCE DAILY 270 capsule 0  . Vitamin D, Ergocalciferol, (DRISDOL) 1.25 MG (50000 UNIT) CAPS capsule      Current Facility-Administered Medications on File Prior to Visit  Medication Dose Route Frequency Provider Last Rate Last Admin  . methylPREDNISolone acetate (DEPO-MEDROL) injection 80 mg  80 mg Intramuscular Once Deneise Lever, MD       Past Medical History:  Diagnosis Date  . Asthma    daily inhalers  .  Breast cancer ([place]) 05/2015   left  . COPD (chronic obstructive pulmonary disease) ([place])    denies SOB with ADLs; no O2  . Dental crowns present   . Depression   . History of concussion 01/01/2014  . History of kidney stones   . History of TIA (transient ischemic attack) 10/2014  . Hyperlipidemia   . Insomnia   . Migraine   . Nasal congestion 05/26/2015   will finish z-pak 05/27/2015  . Nonproductive cough 05/26/2015  . PONV (postoperative nausea and vomiting)    "long time ago"  none recently  . RLS (restless legs syndrome)   . Tobacco abuse   . Vitamin D deficiency   . Wears dentures    lower  . Wears partial dentures    upper   Past Surgical History:  Procedure Laterality Date  . ABDOMINAL HYSTERECTOMY    . APPENDECTOMY  2000  . BREAST BIOPSY Left   . BREAST RECONSTRUCTION WITH PLACEMENT OF TISSUE EXPANDER AND FLEX HD (ACELLULAR HYDRATED DERMIS) Bilateral 06/02/2015   Procedure: BILATERAL BREAST RECONSTRUCTION WITH PLACEMENT OF TISSUE EXPANDER AND  ACELLULAR HYDRATED DERMIS;  Surgeon: Irene Limbo, MD;  Location: [facility];  Service: Plastics;  Laterality: Bilateral;  . CARPAL TUNNEL RELEASE Bilateral   . CATARACT EXTRACTION Bilateral 09/2013  . KNEE ARTHROSCOPY Left   . 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 Perforation of small intestion requiring repair.   Marland Kitchen LOOP RECORDER INSERTION N/A 05/11/2016   Procedure: Loop Recorder Insertion;  Surgeon: Will Meredith Leeds, MD;  Location: [facility];  Service: Cardiovascular;  Laterality: N/A;  . MASTECTOMY W/ SENTINEL NODE BIOPSY Bilateral 06/02/2015   Procedure: BILATERAL MASTECTOMY WITH LEFT SENTINEL LYMPH NODE BIOPSY;  Surgeon: Stark Klein, MD;  Location: [facility];  Service: General;  Laterality: Bilateral;  . NASAL SEPTUM SURGERY     x 3  . PLANTAR FASCIA SURGERY Left   . REMOVAL OF  BILATERAL TISSUE EXPANDERS WITH PLACEMENT OF BILATERAL BREAST IMPLANTS Bilateral 06/17/2015   Procedure: DEBRIDEMENT OF BILATERAL MASTECTOMY FLAP WITH BILATERAL TISSUE EXPANDER EXCHANGE ;  Surgeon: Irene Limbo, MD;  Location: [facility];  Service: Plastics;  Laterality: Bilateral;  . REMOVAL OF TISSUE EXPANDER Bilateral 07/12/2015   Procedure: REMOVAL OF BILATERAL TISSUE EXPANDERS;  Surgeon: Irene Limbo, MD;  Location: [facility];  Service: Plastics;  Laterality: Bilateral;    Family History  Problem Relation Age of Onset  . Cervical cancer Mother 22  . Colon cancer Maternal Grandfather        mets to stomach; dx. 67-70  . Heart disease Father   . Prostate cancer Father 67  . Cervical cancer Maternal Grandmother        dx. 56s; treated  with radium implant  . Lung cancer Sister 34       maternal half-sister dx. lung cancer, stage III  . Breast cancer Maternal Aunt   . Cervical cancer Sister 38       paternal half-sister; s/p TAH  . Spina bifida Grandchild   . Brain cancer Grandchild        grandson dx. at 20 mos, treated at [facility]  . Breast cancer Other 58       maternal half-sister's daughter  . Cancer Maternal Aunt        d. 45; unspecified type - "began at back area and moved to vital organs"  . Cancer Maternal Uncle         late 49s; unspecified type; "started at back and moved to vital organs"   Social History   Socioeconomic History  . Marital status: Widowed    Spouse name: Not on file  . Number of children: 1  . Years of education: 61  . Highest education level: Not on file  Occupational History    Comment: [employer], retired  Tobacco Use  . Smoking status: Former Smoker    Packs/day: 0.25    Types: Cigarettes    Quit date: 03/21/2020    Years since quitting: 0.1  . Smokeless tobacco: Never Used  Vaping Use  . Vaping Use: Never used  Substance and Sexual Activity  . Alcohol use: Yes    Alcohol/week: 0.0 standard drinks    Comment:  rarely  . Drug use: No  . Sexual activity: Not on file  Other Topics Concern  . Not on file  Social History Narrative   Lives alone, widow   Caffeine use- tea, 1 cup daily   Social Determinants of Health   Financial Resource Strain: Not on file  Food Insecurity: Not on file  Transportation Needs: Not on file  Physical Activity: Not on file  Stress: Not on file  Social Connections: Not on file    Review of Systems  Constitutional: Positive for fatigue. Negative for chills and fever.  HENT: Positive for ear pain. Negative for congestion, rhinorrhea and sore throat.   Respiratory: Positive for cough and shortness of breath.   Cardiovascular: Negative for chest pain.  Gastrointestinal: Positive for constipation. Negative for abdominal pain, diarrhea, nausea and vomiting.  Genitourinary: Negative for dysuria and urgency.  Musculoskeletal: Positive for back pain. Negative for myalgias.  Neurological: Positive for headaches. Negative for dizziness, weakness and light-headedness.  Psychiatric/Behavioral: Positive for agitation, dysphoric mood and sleep disturbance. Negative for suicidal ideas. The patient is not nervous/anxious.      Objective:  BP 120/60   Pulse 89   Temp (!) 97.3 F (36.3 C)   Ht 5\' 1"  (1.549 m)   Wt 98 lb (44.5 kg)   SpO2 100%   BMI 18.52 kg/m   BP/Weight 05/12/2020 05/03/2020 06/12/7351  Systolic BP 299 242 683  Diastolic BP 60 60 60  Wt. (Lbs) 98 92 99  BMI 18.52 17.38 18.71    Physical Exam Vitals reviewed.  Constitutional:      Appearance: Normal appearance. She is normal weight.  HENT:     Right Ear: Tympanic membrane normal.     Left Ear: Tympanic membrane normal.     Nose: No congestion or rhinorrhea.     Mouth/Throat:     Pharynx: No oropharyngeal exudate or posterior oropharyngeal erythema.  Neck:     Vascular: No carotid bruit.  Cardiovascular:  Rate and Rhythm: Normal rate and regular rhythm.     Heart sounds: Normal heart sounds.   Pulmonary:     Effort: Pulmonary effort is normal. No respiratory distress.     Breath sounds: Wheezing (sporadic.) present.  Abdominal:     General: Abdomen is flat. Bowel sounds are normal.     Palpations: Abdomen is soft. There is no mass.     Tenderness: There is abdominal tenderness (LUQ, left lateral umbilical.). There is no guarding or rebound.  Musculoskeletal:        General: Tenderness (lumbar paraspinal muscles and lumbar vertebrae. Negative SLR BL) present.  Neurological:     Mental Status: She is alert and oriented to person, place, and time.  Psychiatric:        Behavior: Behavior normal.     Comments: depressed     Diabetic Foot Exam - Simple   No data filed      Lab Results  Component Value Date   WBC 9.5 05/05/2020   HGB 11.8 05/05/2020   HCT 36.7 05/05/2020   PLT 263 05/05/2020   GLUCOSE 86 05/05/2020   CHOL 150 02/01/2020   TRIG 91 02/01/2020   HDL 59 02/01/2020   LDLCALC 74 02/01/2020   ALT 26 05/05/2020   AST 25 05/05/2020   NA 139 05/05/2020   K 5.2 05/05/2020   CL 104 05/05/2020   CREATININE 0.88 05/05/2020   BUN 35 (H) 05/05/2020   CO2 23 05/05/2020   TSH 1.320 03/01/2020      Assessment & Plan:   1. Chronic midline low back pain without sciatica - Ambulatory referral to Orthopedic Surgery (Dr. Donivan Scull.) Taking norflex 100 mg one twice a day as needed for muscle pain. Do not take methocarbamol or tizanidine.  Continue gabapentin at 600 mg one three times a day.  Refer to Dr. Donivan Scull with [facility] [address] [place], [place] [address] Appt 05/26/2020 at 10:30 am, arrive at 10:00 am.   2. Migraine with aura and without status migrainosus, not intractable Emgality 120 mg 2 shots given for loading dose.  Start on emgality 120 mg monthly. Consider change to an oral equivalent medicine if shot works.  Continue maxalt.  Continue Roselyn Meier (we will try to get it cleared through insurance.) Failed on inderal, topamax, and  amitriptyline.  3. Major depressive disorder, recurrent episode, mild ([place]) Continue current medicine. Pt has a flat angry affect.  - CCM pharmacy monitoring  4. Drug-induced constipation Improved. Start on otc miralax one twice a day.   5. COPD mixed type ([place]) The current medical regimen is effective;  continue present plan and medications. - CCM pharmacy monitoring  6. Primary insomnia The current medical regimen is effective;  continue present plan and medications.  7. Mixed hyperlipidemia Last lipids were good.  The current medical regimen is effective;  continue present plan and medications. - CCM pharmacy monitoring  8. LUQ abdominal pain  Keep appt with Dr. Melina Copa.   Meds ordered this encounter  Medications  . Ubrogepant (UBRELVY) 100 MG TABS    Sig: At on set of migraine, may repeat in 2 hours x 1.    Dispense:  10 tablet    Refill:  3  . orphenadrine (NORFLEX) 100 MG tablet    Sig: Take 1 tablet (100 mg total) by mouth 2 (two) times daily.    Dispense:  60 tablet    Refill:  1    Orders Placed This Encounter  Procedures  .  Ambulatory referral to Orthopedic Surgery  . CCM pharmacy monitoring    I spent 60 minutes with patient face to face and reviewing records.   Follow-up: Return in about 3 weeks (around 06/02/2020).  An After Visit Summary was printed and given to the patient.  [name], MD [facility] [phone]

## 2020-05-12 ENCOUNTER — Encounter: Payer: Self-pay | Admitting: Family Medicine

## 2020-05-12 ENCOUNTER — Other Ambulatory Visit: Payer: Self-pay

## 2020-05-12 ENCOUNTER — Ambulatory Visit (INDEPENDENT_AMBULATORY_CARE_PROVIDER_SITE_OTHER): Payer: BC Managed Care – PPO | Admitting: Family Medicine

## 2020-05-12 VITALS — BP 120/60 | HR 89 | Temp 97.3°F | Ht 61.0 in | Wt 98.0 lb

## 2020-05-12 DIAGNOSIS — J449 Chronic obstructive pulmonary disease, unspecified: Secondary | ICD-10-CM | POA: Diagnosis not present

## 2020-05-12 DIAGNOSIS — M545 Low back pain, unspecified: Secondary | ICD-10-CM

## 2020-05-12 DIAGNOSIS — G43109 Migraine with aura, not intractable, without status migrainosus: Secondary | ICD-10-CM

## 2020-05-12 DIAGNOSIS — E782 Mixed hyperlipidemia: Secondary | ICD-10-CM

## 2020-05-12 DIAGNOSIS — F5101 Primary insomnia: Secondary | ICD-10-CM

## 2020-05-12 DIAGNOSIS — F330 Major depressive disorder, recurrent, mild: Secondary | ICD-10-CM

## 2020-05-12 DIAGNOSIS — R1012 Left upper quadrant pain: Secondary | ICD-10-CM

## 2020-05-12 DIAGNOSIS — G8929 Other chronic pain: Secondary | ICD-10-CM

## 2020-05-12 DIAGNOSIS — K5903 Drug induced constipation: Secondary | ICD-10-CM

## 2020-05-12 MED ORDER — LINACLOTIDE 145 MCG PO CAPS: 145.0000 ug | ORAL_CAPSULE | Freq: Every day | ORAL | 2 refills | Status: DC

## 2020-05-12 MED ORDER — ORPHENADRINE CITRATE ER 100 MG PO TB12: 100.0000 mg | ORAL_TABLET | Freq: Two times a day (BID) | ORAL | 1 refills | Status: DC

## 2020-05-12 MED ORDER — UBRELVY 100 MG PO TABS: ORAL_TABLET | ORAL | 3 refills | Status: DC

## 2020-05-12 NOTE — Patient Instructions (Addendum)
For Migraines: Start on emgality 120 mg monthly. Consider change to an oral equivalent medicine if shot works.  Continue maxalt.  Continue Roselyn Meier (we will try to get it cleared through insurance.)  Back Pain/Left hip pain:  Taking norflex 100 mg one twice a day as needed for muscle pain. Do not take methocarbamol or tizanidine.  Continue gabapentin at 600 mg one three times a day.  Refer to Dr. Donivan Scull with [facility] [address], [place], [place] [address] Appt 05/26/2020 at 10:30 am, arrive at 10:00 am.   Constipation/Abdominal pain:  Start on Miralax twice daily otc.  Keep appt with Dr. Melina Copa for this afternoon.

## 2020-05-14 NOTE — Addendum Note (Signed)
Addended byRochel Brome on: 05/14/2020 09:17 AM   Modules accepted: Level of Service

## 2020-05-17 ENCOUNTER — Ambulatory Visit: Payer: BC Managed Care – PPO | Admitting: Nurse Practitioner

## 2020-05-24 ENCOUNTER — Other Ambulatory Visit: Payer: Self-pay | Admitting: Family Medicine

## 2020-05-24 DIAGNOSIS — F331 Major depressive disorder, recurrent, moderate: Secondary | ICD-10-CM

## 2020-05-26 NOTE — Progress Notes (Signed)
A user error has taken place: encounter opened in error, closed for administrative reasons.

## 2020-05-30 ENCOUNTER — Other Ambulatory Visit: Payer: Self-pay | Admitting: Family Medicine

## 2020-05-30 DIAGNOSIS — F331 Major depressive disorder, recurrent, moderate: Secondary | ICD-10-CM

## 2020-06-01 NOTE — Progress Notes (Addendum)
Subjective:  Patient ID: Anne [name], female    DOB: 01-Oct-1945  Age: 75 y.o. MRN: [id number]  Chief Complaint  Patient presents with  . 3 week follow up    HPI Abdominal pain: improved with dicyclomine, but not resolved. Averages 6/10. Chronic back pain: seeing Dr. Donivan Scull. Repeat MRI of lumbar spine today. Increase in gabapentin to 600 mg one three times and norflex Migraines: Given loading dose of emgallity at last visit. 2-3 migraines per week. Lasts all day. 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  Insomnia: temazepam 15 mg once at night, amitriptyline 75 mg once daily at night. and mirtazepine 45 mg once at night.  Continued poor appetite. Late supper 10-11 pm ONLY.   COPD: Coughing and clearing throat a lot. Difficulty swallowing. Feels like something is caught in her throat.  Fell: Tuesday this week.She was on a step stool and was on third step and fell. Depression: not good. Temper is very short. Taking effexor xr 75 mg 3 in am, mirtazapine 45 mg once daily at night, and amitriptyline 75 mg once daily at night. Mood disorder questionnaire filled out - 1. 6 positives, 2 positive, 3. Moderate problem. 4. Negative, 5. Negative.    Current Outpatient Medications on File Prior to Visit  Medication Sig Dispense Refill  . rizatriptan (MAXALT-MLT) 10 MG disintegrating tablet Take at onset of migraines, May repeat in 2 hours if not resolved 10 tablet 2  . albuterol (VENTOLIN HFA) 108 (90 Base) MCG/ACT inhaler INHALE 1 TO 2 PUFFS BY MOUTH EVERY 6 HOURS AS NEEDED FOR WHEEZING FOR SHORTNESS OF BREATH 18 g 12  . atorvastatin (LIPITOR) 20 MG tablet Take 1 tablet (20 mg total) by mouth daily. 90 tablet 0  . cetirizine (ZYRTEC) 10 MG tablet Take 1 tablet (10 mg total) by mouth daily. 30 tablet 11  . Cholecalciferol (VITAMIN D3) 50000 UNITS CAPS Take 50,000 Units by mouth once a week.     . fenofibrate 160 MG tablet Take 1 tablet (160 mg  total) by mouth daily. 90 tablet 0  . Fluticasone-Umeclidin-Vilant (TRELEGY ELLIPTA) 200-62.5-25 MCG/INH AEPB Inhale 1 puff into the lungs daily. 28 each 0  . furosemide (LASIX) 20 MG tablet Take 20 mg by mouth daily as needed for edema (for leg swelling).     . gabapentin (NEURONTIN) 300 MG capsule Take 2 capsules (600 mg total) by mouth 3 (three) times daily. 180 capsule 1  . ipratropium-albuterol (DUONEB) 0.5-2.5 (3) MG/3ML SOLN Take 3 mLs by nebulization every 6 (six) hours as needed. 360 mL 12  . mirtazapine (REMERON) 45 MG tablet Take 1 tablet (45 mg total) by mouth at bedtime. 90 tablet 1  . omeprazole (PRILOSEC) 40 MG capsule Take 1 capsule (40 mg total) by mouth 2 (two) times daily. 60 capsule 1  . orphenadrine (NORFLEX) 100 MG tablet Take 1 tablet (100 mg total) by mouth 2 (two) times daily. 60 tablet 1  . potassium chloride SA (KLOR-CON) 20 MEQ tablet TAKE 1 TABLET BY MOUTH ONCE DAILY WITH FOOD    . primidone (MYSOLINE) 50 MG tablet TAKE 1 TABLET BY MOUTH IN THE MORNING AND 1 AT BEDTIME 60 tablet 5  . promethazine (PHENERGAN) 25 MG tablet Take 1 tablet by mouth as needed for nausea/vomiting.    . tamoxifen (NOLVADEX) 10 MG tablet Take 2 tablets by mouth once daily 90 tablet 3  . temazepam (RESTORIL) 15 MG capsule TAKE  1 CAPSULE BY MOUTH ONCE DAILY AT BEDTIME AS NEEDED 45 capsule 0  . Vitamin D, Ergocalciferol, (DRISDOL) 1.25 MG (50000 UNIT) CAPS capsule      Current Facility-Administered Medications on File Prior to Visit  Medication Dose Route Frequency Provider Last Rate Last Admin  . methylPREDNISolone acetate (DEPO-MEDROL) injection 80 mg  80 mg Intramuscular Once Deneise Lever, MD       Past Medical History:  Diagnosis Date  . Asthma    daily inhalers  . Breast cancer ([place]) 05/2015   left  . COPD (chronic obstructive pulmonary disease) ([place])    denies SOB with ADLs; no O2  . Dental crowns present   . Depression   . History of concussion 01/01/2014  . History of kidney  stones   . History of TIA (transient ischemic attack) 10/2014  . Hyperlipidemia   . Insomnia   . Migraine   . Nasal congestion 05/26/2015   will finish z-pak 05/27/2015  . Nonproductive cough 05/26/2015  . PONV (postoperative nausea and vomiting)    "long time ago"  none recently  . RLS (restless legs syndrome)   . Tobacco abuse   . Vitamin D deficiency   . Wears dentures    lower  . Wears partial dentures    upper   Past Surgical History:  Procedure Laterality Date  . ABDOMINAL HYSTERECTOMY    . APPENDECTOMY  2000  . BREAST BIOPSY Left   . BREAST RECONSTRUCTION WITH PLACEMENT OF TISSUE EXPANDER AND FLEX HD (ACELLULAR HYDRATED DERMIS) Bilateral 06/02/2015   Procedure: BILATERAL BREAST RECONSTRUCTION WITH PLACEMENT OF TISSUE EXPANDER AND  ACELLULAR HYDRATED DERMIS;  Surgeon: Irene Limbo, MD;  Location: [facility];  Service: Plastics;  Laterality: Bilateral;  . CARPAL TUNNEL RELEASE Bilateral   . CATARACT EXTRACTION Bilateral 09/2013  . KNEE ARTHROSCOPY Left   . 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 Perforation of small intestion requiring repair.   Marland Kitchen LOOP RECORDER INSERTION N/A 05/11/2016   Procedure: Loop Recorder Insertion;  Surgeon: Will Meredith Leeds, MD;  Location: [facility];  Service: Cardiovascular;  Laterality: N/A;  . MASTECTOMY W/ SENTINEL NODE BIOPSY Bilateral 06/02/2015   Procedure: BILATERAL MASTECTOMY WITH LEFT SENTINEL LYMPH NODE BIOPSY;  Surgeon: Stark Klein, MD;  Location: [facility];  Service: General;  Laterality: Bilateral;  . NASAL SEPTUM SURGERY     x 3  . PLANTAR FASCIA SURGERY Left   . REMOVAL OF BILATERAL TISSUE EXPANDERS WITH PLACEMENT OF BILATERAL BREAST IMPLANTS Bilateral 06/17/2015   Procedure: DEBRIDEMENT OF BILATERAL MASTECTOMY FLAP WITH BILATERAL TISSUE EXPANDER EXCHANGE ;  Surgeon: Irene Limbo, MD;   Location: [facility];  Service: Plastics;  Laterality: Bilateral;  . REMOVAL OF TISSUE EXPANDER Bilateral 07/12/2015   Procedure: REMOVAL OF BILATERAL TISSUE EXPANDERS;  Surgeon: Irene Limbo, MD;  Location: [facility];  Service: Plastics;  Laterality: Bilateral;    Family History  Problem Relation Age of Onset  . Cervical cancer Mother 31  . Colon cancer Maternal Grandfather        mets to stomach; dx. 67-70  . Heart disease Father   . Prostate cancer Father 6  . Cervical cancer Maternal Grandmother        dx. 105s; treated with radium implant  . Lung cancer Sister 73       maternal half-sister dx. lung cancer, stage III  . Breast cancer Maternal Aunt   .  Cervical cancer Sister 59       paternal half-sister; s/p TAH  . Spina bifida Grandchild   . Brain cancer Grandchild        grandson dx. at 20 mos, treated at [facility]  . Breast cancer Other 98       maternal half-sister's daughter  . Cancer Maternal Aunt        d. 23; unspecified type - "began at back area and moved to vital organs"  . Cancer Maternal Uncle         late 41s; unspecified type; "started at back and moved to vital organs"   Social History   Socioeconomic History  . Marital status: Widowed    Spouse name: Not on file  . Number of children: 1  . Years of education: 27  . Highest education level: Not on file  Occupational History    Comment: [employer], retired  Tobacco Use  . Smoking status: Former Smoker    Packs/day: 0.25    Types: Cigarettes    Quit date: 03/21/2020    Years since quitting: 0.2  . Smokeless tobacco: Never Used  Vaping Use  . Vaping Use: Never used  Substance and Sexual Activity  . Alcohol use: Yes    Alcohol/week: 0.0 standard drinks    Comment: rarely  . Drug use: No  . Sexual activity: Not on file  Other Topics Concern  . Not on file  Social History Narrative   Lives alone, widow   Caffeine use- tea, 1 cup daily   Social Determinants of Health    Financial Resource Strain: Not on file  Food Insecurity: Not on file  Transportation Needs: Not on file  Physical Activity: Not on file  Stress: Not on file  Social Connections: Not on file    Review of Systems  Constitutional: Positive for fatigue and fever (99.9 this morning. 100 last night. ). Negative for chills.  HENT: Positive for rhinorrhea and sore throat. Negative for congestion and ear pain.   Eyes: Positive for visual disturbance (diplopia with watching TV).  Respiratory: Positive for cough and shortness of breath.   Cardiovascular: Negative for chest pain.  Gastrointestinal: Positive for abdominal pain and constipation (taking benefiber. bowels better. taking dicyclomine 10 mg 2 pills tid. ). Negative for diarrhea, nausea and vomiting.  Genitourinary: Negative for dysuria and urgency.  Musculoskeletal: Positive for back pain. Negative for myalgias.  Neurological: Positive for dizziness, light-headedness and headaches. Negative for weakness.  Psychiatric/Behavioral: Positive for dysphoric mood and sleep disturbance. The patient is nervous/anxious.    PHQ9 SCORE ONLY 06/02/2020 04/28/2020 04/14/2020  PHQ-9 Total Score 20 19 19      Objective:  BP 136/70   Pulse 84   Temp 98.1 F (36.7 C)   Resp 18   Ht 5\' 1"  (1.549 m)   Wt 95 lb (43.1 kg)   BMI 17.95 kg/m   BP/Weight 06/02/2020 09/03/6376 07/10/8500  Systolic BP 774 128 786  Diastolic BP 70 60 60  Wt. (Lbs) 95 98 92  BMI 17.95 18.52 17.38    Physical Exam Vitals reviewed.  Constitutional:      Appearance: Normal appearance. She is normal weight.  HENT:     Mouth/Throat:     Pharynx: No oropharyngeal exudate or posterior oropharyngeal erythema.     Comments: Dry mucosa of throat/tongue. No white film, but some rubor.  Neck:     Vascular: No carotid bruit.  Cardiovascular:     Rate and Rhythm:  Normal rate and regular rhythm.     Pulses: Normal pulses.     Heart sounds: Normal heart sounds.  Pulmonary:      Effort: Pulmonary effort is normal. No respiratory distress.     Breath sounds: Normal breath sounds.  Abdominal:     General: Abdomen is flat. Bowel sounds are normal.     Palpations: Abdomen is soft.     Tenderness: There is abdominal tenderness (lower quadrants.).  Neurological:     Mental Status: She is alert and oriented to person, place, and time.  Psychiatric:        Mood and Affect: Mood normal.        Behavior: Behavior normal.      Lab Results  Component Value Date   WBC 9.5 05/05/2020   HGB 11.8 05/05/2020   HCT 36.7 05/05/2020   PLT 263 05/05/2020   GLUCOSE 86 05/05/2020   CHOL 150 02/01/2020   TRIG 91 02/01/2020   HDL 59 02/01/2020   LDLCALC 74 02/01/2020   ALT 26 05/05/2020   AST 25 05/05/2020   NA 139 05/05/2020   K 5.2 05/05/2020   CL 104 05/05/2020   CREATININE 0.88 05/05/2020   BUN 35 (H) 05/05/2020   CO2 23 05/05/2020   TSH 1.320 03/01/2020      Assessment & Plan:   1. Chronic midline low back pain without sciatica Keep mri appt today. - AMB Referral to [facility]  2. Migraine with aura and without status migrainosus, not intractable Changed emgallity to aimovig. Aimovig 70 mg monthly.  Use imitrex or maxalt.  Decrease amitriptyline 75 mg 1/2 pill daily x 2 weeks, then discontinue due to dry mouth. - AMB Referral to [facility]  3. Moderate recurrent major depression ([place]) Start on abilify 5 mg once daily.  Decrease effexor xr 75 mg 2 in am. Pt given contact information for Dr. Vita Barley, psychologist.  - AMB Referral to [facility]  4. Drug-induced constipation Greatly improved.   5. COPD mixed type ([place]) The current medical regimen is effective;  continue present plan and medications. - AMB Referral to [facility]  6. Primary insomnia Recommend regular sleep schedule  7. LUQ abdominal pain Improved. Continue dicyclomine.   8. LLQ abdominal pain Improved. Continue  dicyclomine.   9. Acute pharyngitis due to other specified organisms - POCT rapid strep A negative.  Sent nystatin solution in case has a mild thrush. Dry mouth: wean off amitriptyline.     Meds ordered this encounter  Medications  . Erenumab-aooe (AIMOVIG) 70 MG/ML SOAJ    Sig: Inject 1 mL into the skin every 30 (thirty) days.    Dispense:  1 mL    Refill:  3  . amitriptyline (ELAVIL) 75 MG tablet    Sig: Take 0.5 tablets (37.5 mg total) by mouth daily.    Dispense:  1 tablet    Refill:  1  . venlafaxine XR (EFFEXOR-XR) 75 MG 24 hr capsule    Sig: Take 2 capsules (150 mg total) by mouth daily with breakfast.    Dispense:  1 capsule    Refill:  0  . ARIPiprazole (ABILIFY) 5 MG tablet    Sig: Take 1 tablet (5 mg total) by mouth daily.    Dispense:  30 tablet    Refill:  0    Orders Placed This Encounter  Procedures  . AMB Referral to [facility]  . POCT rapid strep A  I spent 45 minutes dedicated to the care of this patient on the date of this encounter to include face-to-face time with the patient, as well as: reviewing medicines, consulttions.  Follow-up: Return in about 4 weeks (around 06/30/2020).  An After Visit Summary was printed and given to the patient.  [name], MD [facility] [phone]

## 2020-06-02 ENCOUNTER — Other Ambulatory Visit: Payer: Self-pay | Admitting: Family Medicine

## 2020-06-02 ENCOUNTER — Encounter: Payer: Self-pay | Admitting: Family Medicine

## 2020-06-02 ENCOUNTER — Telehealth: Payer: Self-pay

## 2020-06-02 ENCOUNTER — Ambulatory Visit (INDEPENDENT_AMBULATORY_CARE_PROVIDER_SITE_OTHER): Payer: BC Managed Care – PPO | Admitting: Family Medicine

## 2020-06-02 ENCOUNTER — Other Ambulatory Visit: Payer: Self-pay

## 2020-06-02 VITALS — BP 136/70 | HR 84 | Temp 98.1°F | Resp 18 | Ht 61.0 in | Wt 95.0 lb

## 2020-06-02 DIAGNOSIS — G43109 Migraine with aura, not intractable, without status migrainosus: Secondary | ICD-10-CM | POA: Diagnosis not present

## 2020-06-02 DIAGNOSIS — R1012 Left upper quadrant pain: Secondary | ICD-10-CM

## 2020-06-02 DIAGNOSIS — F331 Major depressive disorder, recurrent, moderate: Secondary | ICD-10-CM | POA: Diagnosis not present

## 2020-06-02 DIAGNOSIS — K5903 Drug induced constipation: Secondary | ICD-10-CM

## 2020-06-02 DIAGNOSIS — R1032 Left lower quadrant pain: Secondary | ICD-10-CM

## 2020-06-02 DIAGNOSIS — J449 Chronic obstructive pulmonary disease, unspecified: Secondary | ICD-10-CM

## 2020-06-02 DIAGNOSIS — G8929 Other chronic pain: Secondary | ICD-10-CM

## 2020-06-02 DIAGNOSIS — F5101 Primary insomnia: Secondary | ICD-10-CM

## 2020-06-02 DIAGNOSIS — J028 Acute pharyngitis due to other specified organisms: Secondary | ICD-10-CM | POA: Diagnosis not present

## 2020-06-02 DIAGNOSIS — M545 Low back pain, unspecified: Secondary | ICD-10-CM

## 2020-06-02 LAB — POCT RAPID STREP A (OFFICE): Rapid Strep A Screen: NEGATIVE

## 2020-06-02 MED ORDER — VENLAFAXINE HCL ER 75 MG PO CP24: 150.0000 mg | ORAL_CAPSULE | Freq: Every day | ORAL | 0 refills | Status: DC

## 2020-06-02 MED ORDER — ARIPIPRAZOLE 5 MG PO TABS: 5.0000 mg | ORAL_TABLET | Freq: Every day | ORAL | 0 refills | Status: DC

## 2020-06-02 MED ORDER — AIMOVIG 70 MG/ML ~~LOC~~ SOAJ: 1.0000 mL | SUBCUTANEOUS | 3 refills | Status: DC

## 2020-06-02 MED ORDER — AMITRIPTYLINE HCL 75 MG PO TABS: 37.5000 mg | ORAL_TABLET | Freq: Every day | ORAL | 1 refills | Status: DC

## 2020-06-02 MED ORDER — NYSTATIN 100000 UNIT/ML MT SUSP: 5.0000 mL | Freq: Four times a day (QID) | OROMUCOSAL | 0 refills | Status: DC

## 2020-06-02 NOTE — Patient Instructions (Addendum)
Mood disorder: Start on abilify 5 mg once daily.  Decrease effexor xr to 75 mg 2 daily in AM. Recommend pt call counselor: [facility] (Get address, as I think they moved locations) Dr. Vita Barley  [phone]  Migraines: Start on aimovig 70 mg monthly. Decrease amitriptyline to 75 mg 1/2 pill daily x 2 weeks, then discontinue.   Back Pain:  Get MRI of back tomorrow.

## 2020-06-02 NOTE — Telephone Encounter (Signed)
PA submitted and approved for Aimovig via [email or web address]

## 2020-06-10 ENCOUNTER — Other Ambulatory Visit: Payer: Self-pay | Admitting: Family Medicine

## 2020-06-14 ENCOUNTER — Telehealth: Payer: Self-pay

## 2020-06-14 DIAGNOSIS — I361 Nonrheumatic tricuspid (valve) insufficiency: Secondary | ICD-10-CM

## 2020-06-14 NOTE — Telephone Encounter (Signed)
Pt calling complaining of pins and needles in chest going into arms. States it feels like her neuropathy that is in her R foot. Pt states she has tried Hydrocodone x2 at 6 am. Tylenol 1200 mg x2 at 6:30 am. Gabapentin also on med list, pt denies trying this medication as well. Advised not to take medication until further notice. Pt states pain is where she had double mastectomy and rates pain 10/10.   Spoke with NP, advised to go to hospital. Gave pt recommendations and pt VU. Pt advised to make hospital aware of medications taken and not to take more. Pt VU.   Royce Macadamia, [place] 06/14/20 8:09 AM

## 2020-06-15 DIAGNOSIS — R079 Chest pain, unspecified: Secondary | ICD-10-CM

## 2020-06-19 ENCOUNTER — Other Ambulatory Visit: Payer: Self-pay | Admitting: Family Medicine

## 2020-06-20 ENCOUNTER — Other Ambulatory Visit: Payer: Self-pay

## 2020-06-20 ENCOUNTER — Inpatient Hospital Stay: Payer: Medicare Other | Admitting: Family Medicine

## 2020-06-20 NOTE — Progress Notes (Deleted)
Subjective:  Patient ID: Anne [name], female    DOB: Feb 06, 1946  Age: 75 y.o. MRN: [id number]  No chief complaint on file.   HPI  Hospital follow up  ****Received rx refill request for Meloxicam today Current Outpatient Medications on File Prior to Visit  Medication Sig Dispense Refill  . rizatriptan (MAXALT-MLT) 10 MG disintegrating tablet Take at onset of migraines, May repeat in 2 hours if not resolved 10 tablet 2  . albuterol (VENTOLIN HFA) 108 (90 Base) MCG/ACT inhaler INHALE 1 TO 2 PUFFS BY MOUTH EVERY 6 HOURS AS NEEDED FOR WHEEZING FOR SHORTNESS OF BREATH 18 g 12  . amitriptyline (ELAVIL) 75 MG tablet Take 0.5 tablets (37.5 mg total) by mouth daily. 1 tablet 1  . ARIPiprazole (ABILIFY) 5 MG tablet Take 1 tablet (5 mg total) by mouth daily. 30 tablet 0  . atorvastatin (LIPITOR) 20 MG tablet Take 1 tablet (20 mg total) by mouth daily. 90 tablet 0  . cetirizine (ZYRTEC) 10 MG tablet Take 1 tablet (10 mg total) by mouth daily. 30 tablet 11  . Cholecalciferol (VITAMIN D3) 50000 UNITS CAPS Take 50,000 Units by mouth once a week.     Eduard Roux (AIMOVIG) 70 MG/ML SOAJ Inject 1 mL into the skin every 30 (thirty) days. 1 mL 3  . fenofibrate 160 MG tablet Take 1 tablet (160 mg total) by mouth daily. 90 tablet 0  . Fluticasone-Umeclidin-Vilant (TRELEGY ELLIPTA) 200-62.5-25 MCG/INH AEPB Inhale 1 puff into the lungs daily. 28 each 0  . furosemide (LASIX) 20 MG tablet Take 20 mg by mouth daily as needed for edema (for leg swelling).     . gabapentin (NEURONTIN) 300 MG capsule Take 2 capsules (600 mg total) by mouth 3 (three) times daily. 180 capsule 1  . ipratropium-albuterol (DUONEB) 0.5-2.5 (3) MG/3ML SOLN Take 3 mLs by nebulization every 6 (six) hours as needed. 360 mL 12  . mirtazapine (REMERON) 45 MG tablet Take 1 tablet (45 mg total) by mouth at bedtime. 90 tablet 1  . nystatin (MYCOSTATIN) 100000 UNIT/ML suspension Take 5 mLs (500,000 Units total) by mouth 4 (four) times daily.  Gargle, swish and spit 60 mL 0  . omeprazole (PRILOSEC) 40 MG capsule Take 1 capsule (40 mg total) by mouth 2 (two) times daily. 60 capsule 1  . orphenadrine (NORFLEX) 100 MG tablet Take 1 tablet (100 mg total) by mouth 2 (two) times daily. 60 tablet 1  . potassium chloride SA (KLOR-CON) 20 MEQ tablet TAKE 1 TABLET BY MOUTH ONCE DAILY WITH FOOD    . primidone (MYSOLINE) 50 MG tablet TAKE 1 TABLET BY MOUTH IN THE MORNING AND 1 AT BEDTIME 60 tablet 5  . promethazine (PHENERGAN) 25 MG tablet Take 1 tablet by mouth as needed for nausea/vomiting.    . tamoxifen (NOLVADEX) 10 MG tablet Take 2 tablets by mouth once daily 90 tablet 3  . temazepam (RESTORIL) 15 MG capsule TAKE 1 CAPSULE BY MOUTH ONCE DAILY AT BEDTIME AS NEEDED 45 capsule 0  . venlafaxine XR (EFFEXOR-XR) 75 MG 24 hr capsule Take 2 capsules (150 mg total) by mouth daily with breakfast. 1 capsule 0  . Vitamin D, Ergocalciferol, (DRISDOL) 1.25 MG (50000 UNIT) CAPS capsule      Current Facility-Administered Medications on File Prior to Visit  Medication Dose Route Frequency Provider Last Rate Last Admin  . methylPREDNISolone acetate (DEPO-MEDROL) injection 80 mg  80 mg Intramuscular Once Deneise Lever, MD       Past Medical  History:  Diagnosis Date  . Asthma    daily inhalers  . Breast cancer ([place]) 05/2015   left  . COPD (chronic obstructive pulmonary disease) ([place])    denies SOB with ADLs; no O2  . Dental crowns present   . Depression   . History of concussion 01/01/2014  . History of kidney stones   . History of TIA (transient ischemic attack) 10/2014  . Hyperlipidemia   . Insomnia   . Migraine   . Nasal congestion 05/26/2015   will finish z-pak 05/27/2015  . Nonproductive cough 05/26/2015  . PONV (postoperative nausea and vomiting)    "long time ago"  none recently  . RLS (restless legs syndrome)   . Tobacco abuse   . Vitamin D deficiency   . Wears dentures    lower  . Wears partial dentures    upper   Past Surgical  History:  Procedure Laterality Date  . ABDOMINAL HYSTERECTOMY    . APPENDECTOMY  2000  . BREAST BIOPSY Left   . BREAST RECONSTRUCTION WITH PLACEMENT OF TISSUE EXPANDER AND FLEX HD (ACELLULAR HYDRATED DERMIS) Bilateral 06/02/2015   Procedure: BILATERAL BREAST RECONSTRUCTION WITH PLACEMENT OF TISSUE EXPANDER AND  ACELLULAR HYDRATED DERMIS;  Surgeon: Irene Limbo, MD;  Location: [facility];  Service: Plastics;  Laterality: Bilateral;  . CARPAL TUNNEL RELEASE Bilateral   . CATARACT EXTRACTION Bilateral 09/2013  . KNEE ARTHROSCOPY Left   . 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 Perforation of small intestion requiring repair.   Marland Kitchen LOOP RECORDER INSERTION N/A 05/11/2016   Procedure: Loop Recorder Insertion;  Surgeon: Will Meredith Leeds, MD;  Location: [facility];  Service: Cardiovascular;  Laterality: N/A;  . MASTECTOMY W/ SENTINEL NODE BIOPSY Bilateral 06/02/2015   Procedure: BILATERAL MASTECTOMY WITH LEFT SENTINEL LYMPH NODE BIOPSY;  Surgeon: Stark Klein, MD;  Location: [facility];  Service: General;  Laterality: Bilateral;  . NASAL SEPTUM SURGERY     x 3  . PLANTAR FASCIA SURGERY Left   . REMOVAL OF BILATERAL TISSUE EXPANDERS WITH PLACEMENT OF BILATERAL BREAST IMPLANTS Bilateral 06/17/2015   Procedure: DEBRIDEMENT OF BILATERAL MASTECTOMY FLAP WITH BILATERAL TISSUE EXPANDER EXCHANGE ;  Surgeon: Irene Limbo, MD;  Location: [facility];  Service: Plastics;  Laterality: Bilateral;  . REMOVAL OF TISSUE EXPANDER Bilateral 07/12/2015   Procedure: REMOVAL OF BILATERAL TISSUE EXPANDERS;  Surgeon: Irene Limbo, MD;  Location: [facility];  Service: Plastics;  Laterality: Bilateral;    Family History  Problem Relation Age of Onset  . Cervical cancer Mother 58  . Colon cancer Maternal Grandfather        mets to stomach; dx. 67-70  . Heart disease  Father   . Prostate cancer Father 55  . Cervical cancer Maternal Grandmother        dx. 54s; treated with radium implant  . Lung cancer Sister 56       maternal half-sister dx. lung cancer, stage III  . Breast cancer Maternal Aunt   . Cervical cancer Sister 9       paternal half-sister; s/p TAH  . Spina bifida Grandchild   . Brain cancer Grandchild        grandson dx. at 20 mos, [facility] at [facility]  . Breast cancer Other 58       maternal half-sister's daughter  . Cancer Maternal Aunt        d. 43; unspecified type - "  began at back area and moved to vital organs"  . Cancer Maternal Uncle         late 69s; unspecified type; "started at back and moved to vital organs"   Social History   Socioeconomic History  . Marital status: Widowed    Spouse name: Not on file  . Number of children: 1  . Years of education: 51  . Highest education level: Not on file  Occupational History    Comment: [employer], retired  Tobacco Use  . Smoking status: Former Smoker    Packs/day: 0.25    Types: Cigarettes    Quit date: 03/21/2020    Years since quitting: 0.2  . Smokeless tobacco: Never Used  Vaping Use  . Vaping Use: Never used  Substance and Sexual Activity  . Alcohol use: Yes    Alcohol/week: 0.0 standard drinks    Comment: rarely  . Drug use: No  . Sexual activity: Not on file  Other Topics Concern  . Not on file  Social History Narrative   Lives alone, widow   Caffeine use- tea, 1 cup daily   Social Determinants of Health   Financial Resource Strain: Not on file  Food Insecurity: Not on file  Transportation Needs: Not on file  Physical Activity: Not on file  Stress: Not on file  Social Connections: Not on file    Review of Systems   Objective:  There were no vitals taken for this visit.  BP/Weight 06/02/2020 3/88/8280 0/05/4915  Systolic BP 915 056 979  Diastolic BP 70 60 60  Wt. (Lbs) 95 98 92  BMI 17.95 18.52 17.38    Physical Exam  Diabetic Foot Exam  - Simple   No data filed      Lab Results  Component Value Date   WBC 9.5 05/05/2020   HGB 11.8 05/05/2020   HCT 36.7 05/05/2020   PLT 263 05/05/2020   GLUCOSE 86 05/05/2020   CHOL 150 02/01/2020   TRIG 91 02/01/2020   HDL 59 02/01/2020   LDLCALC 74 02/01/2020   ALT 26 05/05/2020   AST 25 05/05/2020   NA 139 05/05/2020   K 5.2 05/05/2020   CL 104 05/05/2020   CREATININE 0.88 05/05/2020   BUN 35 (H) 05/05/2020   CO2 23 05/05/2020   TSH 1.320 03/01/2020      Assessment & Plan:   There are no diagnoses linked to this encounter.   No orders of the defined types were placed in this encounter.   No orders of the defined types were placed in this encounter.    Follow-up: No follow-ups on file.  An After Visit Summary was printed and given to the patient.  [name], MD [facility] [phone]

## 2020-06-21 ENCOUNTER — Other Ambulatory Visit: Payer: Self-pay | Admitting: Family Medicine

## 2020-06-22 ENCOUNTER — Telehealth: Payer: Self-pay | Admitting: Family Medicine

## 2020-06-22 ENCOUNTER — Ambulatory Visit (INDEPENDENT_AMBULATORY_CARE_PROVIDER_SITE_OTHER): Payer: Medicare Other | Admitting: Family Medicine

## 2020-06-22 ENCOUNTER — Telehealth: Payer: Self-pay

## 2020-06-22 ENCOUNTER — Other Ambulatory Visit: Payer: Self-pay

## 2020-06-22 VITALS — BP 124/60 | HR 84 | Temp 97.6°F | Resp 16 | Ht 61.0 in | Wt 97.0 lb

## 2020-06-22 DIAGNOSIS — G4709 Other insomnia: Secondary | ICD-10-CM

## 2020-06-22 DIAGNOSIS — M545 Low back pain, unspecified: Secondary | ICD-10-CM | POA: Diagnosis not present

## 2020-06-22 DIAGNOSIS — G43109 Migraine with aura, not intractable, without status migrainosus: Secondary | ICD-10-CM | POA: Diagnosis not present

## 2020-06-22 DIAGNOSIS — K5909 Other constipation: Secondary | ICD-10-CM

## 2020-06-22 DIAGNOSIS — R270 Ataxia, unspecified: Secondary | ICD-10-CM

## 2020-06-22 DIAGNOSIS — F331 Major depressive disorder, recurrent, moderate: Secondary | ICD-10-CM | POA: Diagnosis not present

## 2020-06-22 DIAGNOSIS — R296 Repeated falls: Secondary | ICD-10-CM

## 2020-06-22 DIAGNOSIS — J449 Chronic obstructive pulmonary disease, unspecified: Secondary | ICD-10-CM | POA: Diagnosis not present

## 2020-06-22 DIAGNOSIS — G8929 Other chronic pain: Secondary | ICD-10-CM

## 2020-06-22 DIAGNOSIS — R0789 Other chest pain: Secondary | ICD-10-CM

## 2020-06-22 MED ORDER — MELOXICAM 15 MG PO TABS: 15.0000 mg | ORAL_TABLET | Freq: Every day | ORAL | 0 refills | Status: DC

## 2020-06-22 MED ORDER — AIMOVIG 140 MG/ML ~~LOC~~ SOAJ: 140.0000 mg | SUBCUTANEOUS | 5 refills | Status: DC

## 2020-06-22 MED ORDER — HYDROCODONE-ACETAMINOPHEN 5-325 MG PO TABS: 1.0000 | ORAL_TABLET | Freq: Every day | ORAL | 0 refills | Status: DC | PRN

## 2020-06-22 NOTE — Patient Instructions (Signed)
Call Dr. Towanda Octave office to schedule a follow-up to review her lumbar MRI. Constipation: Increase MiraLAX to twice a day. Migraines: Increase Aimovig to 140 mg once monthly.  Continue Imitrex or Maxalt for as needed treatment.  Use hydrocodone sparingly. Depression: Continue current medications. Call counselor: Dr. Beatris Ship with [place] counseling. Refer to physical therapy for frequent falls and ataxia. Follow-up in 3 months fasting

## 2020-06-22 NOTE — Progress Notes (Signed)
Subjective:  Patient ID: Anne [name], female    DOB: 03-May-1945  Age: 75 y.o. MRN: [id number]  Chief Complaint  Patient presents with  . Hospitalization Follow-up    HPI  Hospital follow up for chest pain on June 14, 2020 and discharged on June 15, 2020.. Chest pain has resolved. Negative nuclear stress test and essentially normal echo other than diastolic dysfunction.  Chest x-ray showed consistent with COPD.  Chronic midline low back pain without sciatica Pt is seeing Dr. Donivan Scull. She has had an MRI of her lumbar spine, but has not heard from him concerning results.   Migraine with aura and without status migrainosus, not intractable At her last appointment I changed emgallity to [place]. Aimovig 70 mg monthly.  Recommended use of imitrex or maxalt. Insurance not covering [place]. Migraines have improved some.  Discontinued amitriptyline due to dry mouth. Insurance not covering [place].  Moderate recurrent major depression ([facility]) I started on abilify 5 mg once daily and decreased effexor xr 75 mg 2 in AM. Poor sleep. Patient is taking effexor xr at bedtime instead of in am.   Pt  Has not called Dr. Vita Barley, psychologist.   Drug-induced constipation: still present some, but is having BMs daily. Still having left abdominal pain.  Not taking hydrocodone or tramadol.  Dicyclomine has helped some.  Pt saw Dr. Melina Copa.Has not changed recommendations per pt.   Current Outpatient Medications on File Prior to Visit  Medication Sig Dispense Refill  . rizatriptan (MAXALT-MLT) 10 MG disintegrating tablet Take at onset of migraines, May repeat in 2 hours if not resolved 10 tablet 2  . albuterol (VENTOLIN HFA) 108 (90 Base) MCG/ACT inhaler INHALE 1 TO 2 PUFFS BY MOUTH EVERY 6 HOURS AS NEEDED FOR WHEEZING FOR SHORTNESS OF BREATH 18 g 12  . ARIPiprazole (ABILIFY) 5 MG tablet Take 1 tablet (5 mg total) by mouth daily. 30 tablet 0  . atorvastatin (LIPITOR) 20 MG tablet Take 1 tablet (20 mg total)  by mouth daily. 90 tablet 0  . cetirizine (ZYRTEC) 10 MG tablet Take 1 tablet (10 mg total) by mouth daily. 30 tablet 11  . Cholecalciferol (VITAMIN D3) 50000 UNITS CAPS Take 50,000 Units by mouth once a week.     . fenofibrate 160 MG tablet Take 1 tablet (160 mg total) by mouth daily. 90 tablet 0  . Fluticasone-Umeclidin-Vilant (TRELEGY ELLIPTA) 200-62.5-25 MCG/INH AEPB Inhale 1 puff into the lungs daily. 28 each 0  . furosemide (LASIX) 20 MG tablet Take 20 mg by mouth daily as needed for edema (for leg swelling).     . gabapentin (NEURONTIN) 300 MG capsule Take 2 capsules (600 mg total) by mouth 3 (three) times daily. 180 capsule 1  . ipratropium-albuterol (DUONEB) 0.5-2.5 (3) MG/3ML SOLN Take 3 mLs by nebulization every 6 (six) hours as needed. 360 mL 12  . mirtazapine (REMERON) 45 MG tablet Take 1 tablet (45 mg total) by mouth at bedtime. 90 tablet 1  . nystatin (MYCOSTATIN) 100000 UNIT/ML suspension Take 5 mLs (500,000 Units total) by mouth 4 (four) times daily. Gargle, swish and spit 60 mL 0  . omeprazole (PRILOSEC) 40 MG capsule Take 1 capsule (40 mg total) by mouth 2 (two) times daily. 60 capsule 1  . orphenadrine (NORFLEX) 100 MG tablet Take 1 tablet (100 mg total) by mouth 2 (two) times daily. 60 tablet 1  . potassium chloride SA (KLOR-CON) 20 MEQ tablet TAKE 1 TABLET BY MOUTH ONCE DAILY WITH FOOD    .  primidone (MYSOLINE) 50 MG tablet TAKE 1 TABLET BY MOUTH IN THE MORNING AND 1 AT BEDTIME 60 tablet 5  . promethazine (PHENERGAN) 25 MG tablet Take 1 tablet by mouth as needed for nausea/vomiting.    . tamoxifen (NOLVADEX) 10 MG tablet Take 2 tablets by mouth once daily 90 tablet 3  . temazepam (RESTORIL) 15 MG capsule TAKE 1 CAPSULE BY MOUTH ONCE DAILY AT BEDTIME AS NEEDED 45 capsule 0  . venlafaxine XR (EFFEXOR-XR) 75 MG 24 hr capsule Take 2 capsules (150 mg total) by mouth daily with breakfast. 1 capsule 0  . Vitamin D, Ergocalciferol, (DRISDOL) 1.25 MG (50000 UNIT) CAPS capsule       Current Facility-Administered Medications on File Prior to Visit  Medication Dose Route Frequency Provider Last Rate Last Admin  . methylPREDNISolone acetate (DEPO-MEDROL) injection 80 mg  80 mg Intramuscular Once Deneise Lever, MD       Past Medical History:  Diagnosis Date  . Asthma    daily inhalers  . Breast cancer ([place]) 05/2015   left  . COPD (chronic obstructive pulmonary disease) ([place])    denies SOB with ADLs; no O2  . Dental crowns present   . Depression   . History of concussion 01/01/2014  . History of kidney stones   . History of TIA (transient ischemic attack) 10/2014  . Hyperlipidemia   . Insomnia   . Migraine   . Nasal congestion 05/26/2015   will finish z-pak 05/27/2015  . Nonproductive cough 05/26/2015  . PONV (postoperative nausea and vomiting)    "long time ago"  none recently  . RLS (restless legs syndrome)   . Tobacco abuse   . Vitamin D deficiency   . Wears dentures    lower  . Wears partial dentures    upper   Past Surgical History:  Procedure Laterality Date  . ABDOMINAL HYSTERECTOMY    . APPENDECTOMY  2000  . BREAST BIOPSY Left   . BREAST RECONSTRUCTION WITH PLACEMENT OF TISSUE EXPANDER AND FLEX HD (ACELLULAR HYDRATED DERMIS) Bilateral 06/02/2015   Procedure: BILATERAL BREAST RECONSTRUCTION WITH PLACEMENT OF TISSUE EXPANDER AND  ACELLULAR HYDRATED DERMIS;  Surgeon: Irene Limbo, MD;  Location: [facility];  Service: Plastics;  Laterality: Bilateral;  . CARPAL TUNNEL RELEASE Bilateral   . CATARACT EXTRACTION Bilateral 09/2013  . KNEE ARTHROSCOPY Left   . 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 Perforation of small intestion requiring repair.   Marland Kitchen LOOP RECORDER INSERTION N/A 05/11/2016   Procedure: Loop Recorder Insertion;  Surgeon: Will Meredith Leeds, MD;  Location: [facility];  Service: Cardiovascular;  Laterality: N/A;   . MASTECTOMY W/ SENTINEL NODE BIOPSY Bilateral 06/02/2015   Procedure: BILATERAL MASTECTOMY WITH LEFT SENTINEL LYMPH NODE BIOPSY;  Surgeon: Stark Klein, MD;  Location: [facility];  Service: General;  Laterality: Bilateral;  . NASAL SEPTUM SURGERY     x 3  . PLANTAR FASCIA SURGERY Left   . REMOVAL OF BILATERAL TISSUE EXPANDERS WITH PLACEMENT OF BILATERAL BREAST IMPLANTS Bilateral 06/17/2015   Procedure: DEBRIDEMENT OF BILATERAL MASTECTOMY FLAP WITH BILATERAL TISSUE EXPANDER EXCHANGE ;  Surgeon: Irene Limbo, MD;  Location: [facility];  Service: Plastics;  Laterality: Bilateral;  . REMOVAL OF TISSUE EXPANDER Bilateral 07/12/2015   Procedure: REMOVAL OF BILATERAL TISSUE EXPANDERS;  Surgeon: Irene Limbo, MD;  Location: [facility];  Service: Plastics;  Laterality: Bilateral;    Family History  Problem Relation Age of Onset  . Cervical cancer Mother 37  . Colon cancer Maternal Grandfather        mets to stomach; dx. 67-70  . Heart disease Father   . Prostate cancer Father 98  . Cervical cancer Maternal Grandmother        dx. 54s; treated with radium implant  . Lung cancer Sister 20       maternal half-sister dx. lung cancer, stage III  . Breast cancer Maternal Aunt   . Cervical cancer Sister 37       paternal half-sister; s/p TAH  . Spina bifida Grandchild   . Brain cancer Grandchild        grandson dx. at 20 mos, treated at [facility]  . Breast cancer Other 28       maternal half-sister's daughter  . Cancer Maternal Aunt        d. 76; unspecified type - "began at back area and moved to vital organs"  . Cancer Maternal Uncle         late 29s; unspecified type; "started at back and moved to vital organs"   Social History   Socioeconomic History  . Marital status: Widowed    Spouse name: Not on file  . Number of children: 1  . Years of education: 63  . Highest education level: Not on file  Occupational History    Comment: [employer], retired   Tobacco Use  . Smoking status: Former Smoker    Packs/day: 0.25    Types: Cigarettes    Quit date: 03/21/2020    Years since quitting: 0.2  . Smokeless tobacco: Never Used  Vaping Use  . Vaping Use: Never used  Substance and Sexual Activity  . Alcohol use: Yes    Alcohol/week: 0.0 standard drinks    Comment: rarely  . Drug use: No  . Sexual activity: Not on file  Other Topics Concern  . Not on file  Social History Narrative   Lives alone, widow   Caffeine use- tea, 1 cup daily   Social Determinants of Health   Financial Resource Strain: Not on file  Food Insecurity: Not on file  Transportation Needs: Not on file  Physical Activity: Not on file  Stress: Not on file  Social Connections: Not on file    Review of Systems  Constitutional: Negative for chills, fatigue and fever.  HENT: Positive for congestion. Negative for ear pain and sore throat.   Eyes: Positive for visual disturbance (diplopia in rt eye only, but cannot afford visit to eye doctor.).  Respiratory: Positive for cough. Negative for shortness of breath.   Cardiovascular: Negative for chest pain.  Gastrointestinal: Negative for abdominal pain, constipation, diarrhea, nausea and vomiting.       Usually BMs daily.   Endocrine: Positive for polydipsia.  Genitourinary: Negative for dysuria and urgency.  Musculoskeletal: Negative for arthralgias and myalgias.  Skin: Negative for rash.  Neurological: Positive for headaches. Negative for dizziness.       Pt reports 3 falls in the last week. No injuries.   Psychiatric/Behavioral: Negative for dysphoric mood. The patient is not nervous/anxious.      Objective:  BP 124/60   Pulse 84   Temp 97.6 F (36.4 C)   Resp 16   Ht 5\' 1"  (1.549 m)   Wt 97 lb (44 kg)   BMI 18.33 kg/m   BP/Weight 06/22/2020 06/02/2020 08/25/6331  Systolic BP 545 625 638  Diastolic BP 60 70  60  Wt. (Lbs) 97 95 98  BMI 18.33 17.95 18.52    Physical Exam Vitals reviewed.   Constitutional:      Comments: thin  Cardiovascular:     Rate and Rhythm: Normal rate and regular rhythm.     Heart sounds: Normal heart sounds.  Pulmonary:     Effort: Pulmonary effort is normal.     Breath sounds: Normal breath sounds.  Neurological:     Mental Status: She is alert.  Psychiatric:        Behavior: Behavior normal.     Comments: Mood stable. Dysthymia.      Diabetic Foot Exam - Simple   No data filed      Lab Results  Component Value Date   WBC 9.5 05/05/2020   HGB 11.8 05/05/2020   HCT 36.7 05/05/2020   PLT 263 05/05/2020   GLUCOSE 86 05/05/2020   CHOL 150 02/01/2020   TRIG 91 02/01/2020   HDL 59 02/01/2020   LDLCALC 74 02/01/2020   ALT 26 05/05/2020   AST 25 05/05/2020   NA 139 05/05/2020   K 5.2 05/05/2020   CL 104 05/05/2020   CREATININE 0.88 05/05/2020   BUN 35 (H) 05/05/2020   CO2 23 05/05/2020   TSH 1.320 03/01/2020      Assessment & Plan:   1. Chronic midline low back pain without sciatica Call Dr. Towanda Octave office to schedule a follow-up to review her lumbar MRI.  2. Migraine with aura and without status migrainosus, not intractable Increase Aimovig to 140 mg once monthly.  Continue Imitrex or Maxalt for as needed treatment.  Use hydrocodone sparingly.  3. Moderate recurrent major depression (HCC) Improved.  Continue current medications. Call counselor: Dr. Beatris Ship with [facility].  4. COPD mixed type ([place]) The current medical regimen is effective;  continue present plan and medications.  5. Other insomnia The current medical regimen is somewhat effective;  continue present plan and medications.  6. Other chest pain Resolved. Noncardiac.   7. Frequent falls/Ataxia Refer to physical therapy for frequent falls and ataxia. Negative cardiac work up.  Negative neuro work up in the past.  May be from lumbar spine issues.   8. Constipation: Increase MiraLAX to twice a day.  Meds ordered this encounter   Medications  . HYDROcodone-acetaminophen (NORCO/VICODIN) 5-325 MG tablet    Sig: Take 1 tablet by mouth daily as needed for severe pain (migraines).    Dispense:  30 tablet    Refill:  0  . meloxicam (MOBIC) 15 MG tablet    Sig: Take 1 tablet (15 mg total) by mouth daily.    Dispense:  30 tablet    Refill:  0  . Erenumab-aooe (AIMOVIG) 140 MG/ML SOAJ    Sig: Inject 140 mg into the skin every 30 (thirty) days.    Dispense:  1 mL    Refill:  5    Follow-up: Return in about 3 months (around 09/21/2020) for fasting.  An After Visit Summary was printed and given to the patient.  [name], MD [facility] [phone]

## 2020-06-22 NOTE — Telephone Encounter (Signed)
Patient called wanting to know if she needs to continue her Tamoxifen now that it has been 5 years. Call back number [phone] or [phone].

## 2020-06-22 NOTE — Telephone Encounter (Signed)
Patient needs an follow appointment. Has not been seen in 2 years. Please call patient to schedule appointment at [phone]. Thanks

## 2020-06-22 NOTE — Progress Notes (Signed)
  Chronic Care Management   Outreach Note  06/22/2020 Name: Anne [name] MRN: [id number] DOB: 1945/04/07  Referred by: [name], MD Reason for referral : No chief complaint on file.   An unsuccessful telephone outreach was attempted today. The patient was referred to the pharmacist for assistance with care management and care coordination.   Follow Up Plan:   Carley Perdue [employer] Scheduler

## 2020-06-24 ENCOUNTER — Telehealth: Payer: Self-pay | Admitting: Family Medicine

## 2020-06-24 NOTE — Progress Notes (Signed)
  Chronic Care Management   Note  06/24/2020 Name: Anne [name] MRN: [id number] DOB: 1945-10-09  Anne [name] is a 75 y.o. year old female who is a primary care patient of Cox, Kirsten, MD. I reached out to Anne [name] by phone today in response to a referral sent by Ms. Dellia L Garza's PCP, Cox, Kirsten, MD.   [name] was given information about Chronic Care Management services today including:  1. CCM service includes personalized support from designated clinical staff supervised by her physician, including individualized plan of care and coordination with other care providers 2. 24/7 contact phone numbers for assistance for urgent and routine care needs. 3. Service will only be billed when office clinical staff spend 20 minutes or more in a month to coordinate care. 4. Only one practitioner may furnish and bill the service in a calendar month. 5. The patient may stop CCM services at any time (effective at the end of the month) by phone call to the office staff.   Patient agreed to services and verbal consent obtained.   Follow up plan:   Carley Perdue [employer] Scheduler

## 2020-06-29 ENCOUNTER — Ambulatory Visit: Payer: BC Managed Care – PPO | Admitting: Family Medicine

## 2020-07-03 ENCOUNTER — Encounter: Payer: Self-pay | Admitting: Family Medicine

## 2020-07-06 ENCOUNTER — Other Ambulatory Visit: Payer: Self-pay | Admitting: Physician Assistant

## 2020-07-06 ENCOUNTER — Telehealth: Payer: Self-pay

## 2020-07-06 NOTE — Telephone Encounter (Signed)
Pt calling requesting refills on abilify and trazodone. Abilify pended. Trazodone no longer on medication list. Pt states she is taking 100 mg once daily at bed.   States she has only been able to sleep a couple hours a night. 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   Also questioning why she was referred for PT. She does not now why this was placed and states she works a lot/does not have time.   Royce Macadamia, [place] 07/06/20 3:24 PM

## 2020-07-07 ENCOUNTER — Other Ambulatory Visit: Payer: Self-pay | Admitting: Family Medicine

## 2020-07-07 MED ORDER — TRAZODONE HCL 100 MG PO TABS: 100.0000 mg | ORAL_TABLET | Freq: Every evening | ORAL | 1 refills | Status: DC | PRN

## 2020-07-07 MED ORDER — ARIPIPRAZOLE 5 MG PO TABS: 5.0000 mg | ORAL_TABLET | Freq: Every day | ORAL | 0 refills | Status: DC

## 2020-07-07 NOTE — Telephone Encounter (Signed)
Sent trazodone.  Sent abilify.  Recommended physical therapy, because she is falling down all the time. She has had a negative work up. Also PT can help with her back pain.  Pt needs to make time for her self. kc

## 2020-07-07 NOTE — Telephone Encounter (Signed)
Attempted to call pt. No answer. Left VM for call back to clinic.   Harrell Lark 07/07/20 4:49 PM

## 2020-07-08 NOTE — Telephone Encounter (Signed)
Left a message that medications were sent to the pharmacy.  Patient was instructed to call us back if she wants to proceed with physical therapy.

## 2020-07-13 ENCOUNTER — Telehealth: Payer: Self-pay

## 2020-07-13 NOTE — Progress Notes (Signed)
Chronic Care Management Pharmacy Assistant   Name: Anne [name]  MRN: [id number] DOB: 12-30-1945  Anne [name] is an 75 y.o. year old female who presents for his initial CCM visit with the clinical pharmacist.   Conditions to be addressed/monitored: CAD, HLD, COPD and Depression  Recent office visits:  07/06/20-Dr. Tobie Poet noted, Sent trazodone. Sent abilify.  Recommended physical therapy, because she is falling down all the time. She has had a negative work up. Also PT can help with her back pain.  Pt needs to make time for her self.  06/22/20-Dr. Cox PCP, low back pain, referral to physical therapy, start HYDROcodone-acetaminophen (NORCO/VICODIN) 5-325 MG tablet and Meloxicam 15mg . Erenumab increased to  140mg , stop Amitriptyline Call Dr. Towanda Octave office to schedule a follow-up to review her lumbar MRI. Constipation: Increase MiraLAX to twice a day. Migraines: Increase Aimovig to 140 mg once monthly.  Continue Imitrex or Maxalt for as needed treatment.  Use hydrocodone sparingly. Depression: Continue current medications. Call counselor: Dr. Beatris Ship with  counseling. Refer to physical therapy for frequent falls and ataxia. Follow-up in 3 months fasting   06/14/20- patient message, Pt calling complaining of pins and needles in chest going into arms. States it feels like her neuropathy that is in her R foot. Pt states she has tried Hydrocodone x2 at 6 am. Tylenol 1200 mg x2 at 6:30 am. Gabapentin also on med list, pt denies trying this medication as well. Advised not to take medication until further notice. Pt states pain is where she had double mastectomy and rates pain 10/10.  Spoke with NP, advised to go to hospital. Gave pt recommendations and pt VU. Pt advised to make hospital aware of medications taken and not to take more. Pt VU.   06/02/20-Dr. Cox PCP, low back pain, referral to community care coordination,  Start  ARIPiprazole (ABILIFY) 5 MG tablet, Erenumab-aooe  (AIMOVIG) 70 MG/ML SOAJ,  venlafaxine XR (EFFEXOR-XR), Start on abilify 5 mg once daily.  Decrease effexor xr to 75 mg 2 daily in AM. Recommend pt call counselor: Anne [name] (Get address, as I think they moved locations) Anne [name]  [phone] Migraines: Start on aimovig 70 mg monthly. Decrease amitriptyline to 75 mg 1/2 pill daily x 2 weeks, then discontinue.  Back Pain:  Get MRI of back tomorrow.   310/22-Dr. Cox PCP, COPD-referral to orthopedic surgery, start orphenadrine (NORFLEX) 100 MG tablet and Ubrogepant (UBRELVY) 100 MG TABS. 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 She will need to be transferred from chair to bed.  Continue Roselyn Meier (we will try to get it cleared through insurance.) Failed on inderal, topamax, and amitriptyline.  05/09/20-office note, Anne [name] called to report that the Roselyn Meier was going to be $100 and was too expensive.  Dr. Tobie Poet advised that she stop the Imitrex and begin Maxalt as instructed per Dr Tobie Poet  05/05/20-Labs, Anne [name] breath test is negative, Blood count normal.  Liver and kidney function normal. Amylase and lipase normal.  Anne [name] negative.  Please ask how her abdominal pain is off the hydrocodone and tramadol. See if having soft, regular bms. Encourage pt to see GI a previously discussed.   05/04/20-Office note, Anne [name] called to report that she is taking the gabapentin 300 mg tid. AnneCox advised that she increase the medication to 600 mg tid and she is willing to take omeprazole twice daily.  Rx sent to pharmacy.    05/03/20-DrTobie Poet PCP, LUQ abdominal pain, Labs ordered and H.  [name], stop Paroxetine, Hold hydrocodone/apap. Hold tramadol. Hold methocarbamol. Hold tizanidine.   04/28/20-DrTobie Poet PCP, Dysuria, pt left without being seen   04/25/20-Anne [name] Surgical Specialist, Anne [name] with [facility] office called stating that she has reached out to the patient numerous times and left several voicemails, but has been unable to  reach patient to schedule an appointment.  04/20/20-Dr. Annamaria Boots, Pulmonology, COPD, start methylPREDNISolone acetate (DEPO-MEDROL) injection 80 mg, Fluticasone-Umeclidin-Vilant, Order- depo 80  Dx exacerbation COPD Script sent refilling  albuterol rescue inhaler, tessalon perles , Trelegy Reschedule Home Sleep Test  Sign records release for results of CXR, CT chest and PFT from Anne [name]- sample x 2 Trelegy 200   Inhale 1 puff then rinse mouth, once daily Please call us name of thee home care company that supplies your nebulizer solution so we can renew that, or we can call a new company so you can have this medicine.  04/18/20-Dr. Lininger, Left lower quadrant pain and left flank pain, There is not appear to be a general surgery cause for her left lower quadrant and left flank pain. This may have a urologic association. She does have a right kidney stone. She has typically seen Dr. Nila Nephew in the past and it may not be a bad idea to have her resee him from a urology standpoint. Her symptoms may be related to constipation as she describes hard bowel movements. Hydration during the day and possibly adding MiraLAX could benefit that. The patient does not have an acute surgical abdomen, nor do I feel this is related to "adhesions". Surgical exploration is not indicated. 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 She can follow-up in this office as needed.  04/14/20-AnneCox PCP, LLQ abdominal pain,  MRI wo contrast, stop Methocarbamol, referral to general surgery, NCV with EMG ordered,   03/15/20-results-Left message informing patient that per Dr. Tobie Poet her chest xray was normal, go forward with having CT scan of her chest and to keep her appt with Dr. Annamaria Boots.  03/14/20-DrTobie Poet PCP, COPD, chest xray ordered, restart primidone. Consider referral to ENT. Likely injured vocal cords from coughing. Patient has not had chest xray that was ordered last week.    03/10/20-Orders-[facility] called to report that she completed her antibiotics and she continues to have congestion.  Dr. Tobie Poet had requested that she call us back for chest xray if symptoms have not cleared.  Discussed with Jerrell Belfast, NP and order for chest xray faxed to [name].    03/01/20-DrTobie Poet PCP, fatigue, Labs ordered, CT chest ordered, start sulfamethoxazole-trimethoprim,  Blood count abnormal except wbc elevated. Pt is sick. Liver function normal, Kidney function normal, Thyroid stimulating hormone normal or therapeutic. , B12, Folate, MMA normal.   Covid booster vaccine  02/01/20-Dr. Cox PCP,  Depression, Labs, start Paroxetine, and Tizanidine, stop Methocarbamol, Recommend quit smoking before Christmas.  Patient challenged Pt needs to get the sleep study that Dr. Annamaria Boots has ordered. Pt to follow up if they do not call.  Start on tramadol.Start Paxil 20 mg once daily at night. Keep follow-up appointments with Dr. Annamaria Boots. If have not heard from him about setting up a sleep study, please call him back. Please work on finding a charitable activity which will give you joy.     Recent consult visits:  01/14/20-Pulmonology, smoker referred for sleep evaluation with concern of chronic insomnia, courtesy of Dr Dirk Dress.  Hospital visits:  None in previous 6 months  Medications: Outpatient Encounter Medications as of  07/13/2020  Medication Sig Note  . rizatriptan (MAXALT-MLT) 10 MG disintegrating tablet Take at onset of migraines, May repeat in 2 hours if not resolved   . albuterol (VENTOLIN HFA) 108 (90 Base) MCG/ACT inhaler INHALE 1 TO 2 PUFFS BY MOUTH EVERY 6 HOURS AS NEEDED FOR WHEEZING FOR SHORTNESS OF BREATH   . ARIPiprazole (ABILIFY) 5 MG tablet Take 1 tablet (5 mg total) by mouth daily.   Marland Kitchen atorvastatin (LIPITOR) 20 MG tablet Take 1 tablet (20 mg total) by mouth daily.   . cetirizine (ZYRTEC) 10 MG tablet Take 1 tablet (10 mg total) by mouth daily.   . Cholecalciferol  (VITAMIN D3) 50000 UNITS CAPS Take 50,000 Units by mouth once a week.  05/09/2016: On average once a month (patient does not always remember every week)  . Erenumab-aooe (AIMOVIG) 140 MG/ML SOAJ Inject 140 mg into the skin every 30 (thirty) days.   . fenofibrate 160 MG tablet Take 1 tablet (160 mg total) by mouth daily.   . Fluticasone-Umeclidin-Vilant (TRELEGY ELLIPTA) 200-62.5-25 MCG/INH AEPB Inhale 1 puff into the lungs daily.   . furosemide (LASIX) 20 MG tablet Take 20 mg by mouth daily as needed for edema (for leg swelling).    . gabapentin (NEURONTIN) 300 MG capsule Take 2 capsules (600 mg total) by mouth 3 (three) times daily.   Marland Kitchen HYDROcodone-acetaminophen (NORCO/VICODIN) 5-325 MG tablet Take 1 tablet by mouth daily as needed for severe pain (migraines).   Marland Kitchen ipratropium-albuterol (DUONEB) 0.5-2.5 (3) MG/3ML SOLN Take 3 mLs by nebulization every 6 (six) hours as needed.   . meloxicam (MOBIC) 15 MG tablet Take 1 tablet (15 mg total) by mouth daily.   . mirtazapine (REMERON) 45 MG tablet Take 1 tablet (45 mg total) by mouth at bedtime.   Marland Kitchen nystatin (MYCOSTATIN) 100000 UNIT/ML suspension Take 5 mLs (500,000 Units total) by mouth 4 (four) times daily. Gargle, swish and spit   . omeprazole (PRILOSEC) 40 MG capsule Take 1 capsule (40 mg total) by mouth 2 (two) times daily.   . orphenadrine (NORFLEX) 100 MG tablet Take 1 tablet (100 mg total) by mouth 2 (two) times daily.   . potassium chloride SA (KLOR-CON) 20 MEQ tablet TAKE 1 TABLET BY MOUTH ONCE DAILY WITH FOOD   . primidone (MYSOLINE) 50 MG tablet TAKE 1 TABLET BY MOUTH IN THE MORNING AND 1 AT BEDTIME   . promethazine (PHENERGAN) 25 MG tablet Take 1 tablet by mouth as needed for nausea/vomiting.   . tamoxifen (NOLVADEX) 10 MG tablet Take 2 tablets by mouth once daily   . temazepam (RESTORIL) 15 MG capsule TAKE 1 CAPSULE BY MOUTH ONCE DAILY AT BEDTIME AS NEEDED   . traZODone (DESYREL) 100 MG tablet Take 1 tablet (100 mg total) by mouth at bedtime  as needed for sleep.   Marland Kitchen venlafaxine XR (EFFEXOR-XR) 75 MG 24 hr capsule Take 2 capsules (150 mg total) by mouth daily with breakfast.   . Vitamin D, Ergocalciferol, (DRISDOL) 1.25 MG (50000 UNIT) CAPS capsule     Facility-Administered Encounter Medications as of 07/13/2020  Medication  . methylPREDNISolone acetate (DEPO-MEDROL) injection 80 mg     No results found for: HGBA1C, MICROALBUR   BP Readings from Last 3 Encounters:  06/22/20 124/60  06/02/20 136/70  05/12/20 120/60     . Have you seen any other providers since your last visit with PCP? No  . Any changes in your medications or health? No, patient declined any recent changes to her health or  medications.   . Any side effects from any medications? No, patient declined any issues with her medication  . Do you have an symptoms or problems not managed by your medications? No, patient has no unmanaged symptoms  . Any concerns about your health right now? Yes, patient stated her fatigue and cough.  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    . Has your provider asked that you check blood pressure, blood sugar, or follow special diet at home? No, patient stated she rarely checks her blood pressure, does not check blood sugars and does not follow special diet since she is only 90 pounds  . Do you get any type of exercise on a regular basis? Yes, patient stated the only exercise she has right now is working.   . Can you think of a goal you would like to reach for your health? Yes, patient wants to improve her fatigue and cough, but is still smoking.   . Do you have any problems getting your medications? No o Patient's preferred pharmacy is:  [facility] [address] [facility], [place] [address] Phone: [phone] Fax: [facility] - [address], [place] [address] Phone: [phone] Fax: [phone]  [facility] - [place], [place] - [address] [place] [address] Phone: [phone] Fax: [phone] Patient stated she has not gotten any medications from this pharmacy this year.   . Is there anything that you would like to discuss during the appointment? No, patient stated she could not think of anything specific.   Star Rating Drugs:  Medication:  Last Fill: Day Supply Atorvastatin  08/05/19  [place], [place] Pharmacist Assistant [phone]

## 2020-07-14 NOTE — Progress Notes (Deleted)
Chronic Care Management Pharmacy Note  07/14/2020 Name:  Anne [name] MRN:  [id number] DOB:  05-20-1945  Subjective: Anne [name] is an 75 y.o. year old female who is a primary patient of [name], Kirsten, MD.  The CCM team was consulted for assistance with disease management and care coordination needs.    Engaged with patient by telephone for initial visit in response to provider referral for pharmacy case management and/or care coordination services.   Consent to Services:  The patient was given the following information about Chronic Care Management services today, agreed to services, and gave verbal consent: 1. CCM service includes personalized support from designated clinical staff supervised by the primary care provider, including individualized plan of care and coordination with other care providers 2. 24/7 contact phone numbers for assistance for urgent and routine care needs. 3. Service will only be billed when office clinical staff spend 20 minutes or more in a month to coordinate care. 4. Only one practitioner may furnish and bill the service in a calendar month. 5.The patient may stop CCM services at any time (effective at the end of the month) by phone call to the office staff. 6. The patient will be responsible for cost sharing (co-pay) of up to 20% of the service fee (after annual deductible is met). Patient agreed to services and consent obtained.  Patient Care Team: Anne Brome, MD as PCP - General (Family Medicine) Anne Pickett Nadean Corwin, MD as PCP - Cardiology (Cardiology) Anne [name], [facility] as Pharmacist (Pharmacist)  Recent office visits:  07/06/20-Dr. Tobie [name] noted, Sent trazodone. Sent abilify.  Recommended physical therapy, because she is falling down all the time. She has had a negative work up. Also PT can help with her back pain.  Pt needs to make time for her self.  06/22/20-Dr. Cox PCP, low back pain, referral to physical therapy, start HYDROcodone-acetaminophen  (NORCO/VICODIN) 5-325 MG tablet and Meloxicam 39m. Erenumab increased to  1431m stop Amitriptyline Call Dr. DuTowanda Octaveffice to schedule a follow-up to review her lumbar MRI. Constipation: Increase MiraLAX to twice a day. Migraines: Increase Aimovig to 140 mg once monthly. Continue Imitrex or Maxalt for as needed treatment. Use hydrocodone sparingly. Depression: Continue current medications. Call counselor: Dr. GiBeatris Shipith [place] [facility]. Refer to physical therapy for frequent falls and ataxia. Follow-up in 3 months fasting   06/14/20- patient message, Pt calling complaining of pins and needles in chest going into arms. States it feels like her neuropathy that is in her R foot. Pt states she has tried Hydrocodone x2 at 6 am. Tylenol 1200 mg x2 at 6:30 am. Gabapentin also on med list, pt denies trying this medication as well. Advised not to take medication until further notice. Pt states pain is where she had double mastectomy and rates pain 10/10.  Spoke with NP, advised to go to hospital. Gave pt recommendations and pt VU. Pt advised to make hospital aware of medications taken and not to take more. Pt VU.  06/02/20-Dr. Cox PCP, low back pain, referral to community care coordination,  Start  ARIPiprazole (ABILIFY) 5 MG tablet, Erenumab-aooe (AIMOVIG) 70 MG/ML SOAJ,  venlafaxine XR (EFFEXOR-XR), Start on abilify 5 mg once daily.  Decrease effexor xr to 75 mg 2 daily in AM. [name] I think they moved locations) Dr. GiVita [phone]: Start on aimovig 70 mg monthly. Decrease amitriptyline to 75 mg 1/2 pill daily x 2 weeks, then discontinue.  Back Pain:  Get MRI of back tomorrow.  310/22-Dr. Cox PCP, COPD-referral to orthopedic surgery, start orphenadrine (NORFLEX) 100 MG tablet and Ubrogepant (UBRELVY) 100 MG TABS. 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 Continue Roselyn Meier (we will try to get it cleared through insurance.) Failed on inderal, topamax, and amitriptyline.  05/09/20-office note, Anne [name] called to report that the Roselyn Meier was going to be $100 and was too expensive. Dr. Tobie [name] advised that she stop the Imitrex and begin Maxalt as instructed per Dr Anne [name]  05/05/20-Labs, H. Pylori breath test is negative, Blood count normal.  Liver and kidney function normal. Amylase and lipase normal.  H. Pylori negative.  Please ask how her abdominal pain is off the hydrocodone and tramadol. See if having soft, regular bms. Encourage pt to see GI a previously discussed.   05/04/20-Office note, Anne [name] called to report that she is taking the gabapentin 300 mg tid. Dr.Cox advised that she increase the medication to 600 mg tid and she is willing to take omeprazole twice daily. Rx sent to pharmacy.   05/03/20-Dr. Cox PCP, LUQ abdominal pain, Labs ordered and H. pylori, stop Paroxetine, Hold hydrocodone/apap. Hold tramadol. Hold methocarbamol. Hold tizanidine.  04/28/20-DrTobie [name] PCP, Dysuria, pt left without being seen   04/25/20-[facility] Surgical Specialist, Anne [name] with Dr.Chao's office called stating that she has reached out to the patient numerous times and left several voicemails, but has been unable to reach patient to schedule an appointment.  04/20/20-Dr. Annamaria [name], Pulmonology, COPD, start methylPREDNISolone acetate (DEPO-MEDROL) injection 80 mg, Fluticasone-Umeclidin-Vilant, Order- depo 80 Dx exacerbation COPD Script sent refilling albuterol rescue inhaler, tessalon perles , Trelegy Reschedule Home Sleep Test  Sign records release for results of CXR, CT chest and PFT from Anne [name]- sample x 2 Trelegy 200 Inhale 1 puff then rinse mouth, once daily Please call us name of thee home care company that supplies your nebulizer solution so we can renew that, or we can call a new company so you can have this  medicine.  04/18/20-Dr. Lininger, Left lower quadrant pain and left flank pain, There is not appear to be a general surgery cause for her left lower quadrant and left flank pain. This may have a urologic association. She does have a right kidney stone. She has typically seen Dr. Nila Nephew in the past and it may not be a bad idea to have her resee him from a urology standpoint. Her symptoms may be related to constipation as she describes hard bowel movements. Hydration during the day and possibly adding MiraLAX could benefit that. The patient does not have an acute surgical abdomen, nor do I feel this is related to "adhesions". Surgical exploration is not indicated. 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 She can follow-up in this office as needed.  04/14/20-Dr.Cox PCP, LLQ abdominal pain,  MRI wo contrast, stop Methocarbamol, referral to general surgery, NCV with EMG ordered,   03/15/20-results-Left message informing patient that per Dr. Tobie [name] her chest xray was normal, go forward with having CT scan of her chest and to keep her appt with Dr. Annamaria [name].  03/14/20-DrTobie [name] PCP, COPD, chest xray ordered, restart primidone. Consider referral to ENT. Likely injured vocal cords from coughing. Patient has not had chest xray that was ordered last week.   03/10/20-Orders-Arshiya called to report that she completed her antibiotics and she continues to have congestion. Dr. Tobie [name] had requested that she call us back for chest  xray if symptoms have not cleared. Discussed with Jerrell Belfast, NP and order for chest xray faxed to [facility].   03/01/20-DrTobie [name] PCP, fatigue, Labs ordered, CT chest ordered, start sulfamethoxazole-trimethoprim,  Blood count abnormal except wbc elevated. Pt is sick. Liver function normal, Kidney function normal, Thyroid stimulating hormone normal or therapeutic. , B12, Folate, MMA normal.   Covid booster vaccine  02/01/20-Dr. Cox PCP,  Depression,  Labs, start Paroxetine, and Tizanidine, stop Methocarbamol, Recommend quit smokingbefore Christmas.Patient challenged Pt needs to get the sleep study that Dr. Annamaria [name] has ordered. Pt to follow up if they do not call. Start on tramadol.Start Paxil 20 mg once daily at night. Keep follow-up appointments with Dr. Annamaria [name]. If have not heard from him about setting up a sleep study, please call him back. Please work on finding a charitable activity which will give you joy.    Recent consult visits:  01/14/20-Pulmonology, smoker referred for sleep evaluation with concern of chronic insomnia, courtesy of Dr Dirk Dress.  Hospital visits:  None in previous 6 months  Objective:  Lab Results  Component Value Date   CREATININE 0.88 05/05/2020   BUN 35 (H) 05/05/2020   GFRNONAA 77 03/01/2020   GFRAA 89 03/01/2020   NA 139 05/05/2020   K 5.2 05/05/2020   CALCIUM 8.7 05/05/2020   CO2 23 05/05/2020   GLUCOSE 86 05/05/2020    No results found for: HGBA1C, FRUCTOSAMINE, GFR, MICROALBUR  Last diabetic Eye exam: No results found for: HMDIABEYEEXA  Last diabetic Foot exam: No results found for: HMDIABFOOTEX   Lab Results  Component Value Date   CHOL 150 02/01/2020   HDL 59 02/01/2020   LDLCALC 74 02/01/2020   TRIG 91 02/01/2020   CHOLHDL 2.5 02/01/2020    Hepatic Function Latest Ref Rng & Units 05/05/2020 03/01/2020 02/01/2020  Total Protein 6.0 - 8.5 g/dL 6.0 6.4 5.7(L)  Albumin 3.7 - 4.7 g/dL 3.4(L) 4.1 3.5(L)  AST 0 - 40 IU/L 25 20 15   ALT 0 - 32 IU/L 26 18 12   Alk Phosphatase 44 - 121 IU/L 139(H) 103 83  Total Bilirubin 0.0 - 1.2 mg/dL 0.4 <0.2 <0.2    Lab Results  Component Value Date/Time   TSH 1.320 03/01/2020 04:24 PM   TSH 0.672 08/28/2019 11:37 AM    CBC Latest Ref Rng & Units 05/05/2020 03/01/2020 12/08/2019  WBC 3.4 - 10.8 x10E3/uL 9.5 12.8(H) 8.7  Hemoglobin 11.1 - 15.9 g/dL 11.8 12.5 11.1  Hematocrit 34.0 - 46.6 % 36.7 39.1 34.3  Platelets 150 - 450 x10E3/uL 263 289  205    No results found for: VD25OH  Clinical ASCVD: Yes  The 10-year ASCVD risk score Mikey Bussing DC Jr., et al., 2013) is: 22.7%   Values used to calculate the score:     Age: 54 years     Sex: Female     Is Non-Hispanic African American: No     Diabetic: No     Tobacco smoker: Yes     Systolic Blood Pressure: 414 mmHg     Is BP treated: Yes     HDL Cholesterol: 59 mg/dL     Total Cholesterol: 150 mg/dL    Depression screen [facility] 2/9 06/02/2020 04/28/2020 04/14/2020  Decreased Interest 3 3 1   Down, Depressed, Hopeless 3 3 2   PHQ - 2 Score 6 6 3   Altered sleeping 3 1 3   Tired, decreased energy 3 3 3   Change in appetite 3 3 3   Feeling bad or failure  about yourself  1 0 1  Trouble concentrating 2 3 3   Moving slowly or fidgety/restless 2 3 3   Suicidal thoughts 0 0 0  PHQ-9 Score 20 19 19   Difficult doing work/chores Extremely dIfficult Extremely dIfficult Very difficult     ***Other: (CHADS2VASc if Afib, MMRC or CAT for COPD, ACT, DEXA)  Social History   Tobacco Use  Smoking Status Former Smoker  . Packs/day: 0.25  . Types: Cigarettes  . Quit date: 03/21/2020  . Years since quitting: 0.3  Smokeless Tobacco Never Used   BP Readings from Last 3 Encounters:  06/22/20 124/60  06/02/20 136/70  05/12/20 120/60   Pulse Readings from Last 3 Encounters:  06/22/20 84  06/02/20 84  05/12/20 89   Wt Readings from Last 3 Encounters:  06/22/20 97 lb (44 kg)  06/02/20 95 lb (43.1 kg)  05/12/20 98 lb (44.5 kg)   BMI Readings from Last 3 Encounters:  06/22/20 18.33 kg/m  06/02/20 17.95 kg/m  05/12/20 18.52 kg/m    Assessment/Interventions: Review of patient past medical history, allergies, medications, health status, including review of consultants reports, laboratory and other test data, was performed as part of comprehensive evaluation and provision of chronic care management services.   SDOH:  (Social Determinants of Health) assessments and interventions performed:  Yes  SDOH Screenings   Alcohol Screen: Not on file  Depression (PHQ2-9): Medium Risk  . PHQ-2 Score: 20  Financial Resource Strain: Not on file  Food Insecurity: Not on file  Housing: Not on file  Physical Activity: Not on file  Social Connections: Not on file  Stress: Not on file  Tobacco Use: Medium Risk  . Smoking Tobacco Use: Former Smoker  . Smokeless Tobacco Use: Never Used  Transportation Needs: Not on file    CCM Care Plan  Allergies  Allergen Reactions  . Rocephin [Ceftriaxone Sodium In Dextrose] Shortness Of Breath and Rash  . Clindamycin/Lincomycin Nausea And Vomiting  . Lyrica [Pregabalin] Other (See Comments)    PERSONALITY CHANGE    Medications Reviewed Today    Reviewed by Anne Brome, MD (Physician) on 07/03/20 at [place] List Status: <None>  Medication Order Taking? Sig Documenting Provider Last Dose Status Informant  albuterol (VENTOLIN HFA) 108 (90 Base) MCG/ACT inhaler [id number]  INHALE 1 TO 2 PUFFS BY MOUTH EVERY 6 HOURS AS NEEDED FOR WHEEZING FOR SHORTNESS OF Dia Crawford, Kirsten, MD  Active   ARIPiprazole (ABILIFY) 5 MG tablet [id number]  Take 1 tablet (5 mg total) by mouth daily. [name], Kirsten, MD  Active   atorvastatin (LIPITOR) 20 MG tablet [id number] No Take 1 tablet (20 mg total) by mouth daily. [name], Kirsten, MD Taking Active   cetirizine (ZYRTEC) 10 MG tablet [id number] No Take 1 tablet (10 mg total) by mouth daily. [name], Kirsten, MD Taking Active   Cholecalciferol (VITAMIN D3) 50000 UNITS CAPS [id number] No Take 50,000 Units by mouth once a week.  [provider] Taking Active Self           Med Note Kenton Kingfisher, Earley Favor   Wed May 09, 2016  9:08 AM) On average once a month (patient does not always remember every week)  Erenumab-aooe (AIMOVIG) 140 MG/ML SOAJ [id number] Yes Inject 140 mg into the skin every 30 (thirty) days. Anne Brome, MD  Active   fenofibrate 160 MG tablet [id number] No Take 1 tablet (160 mg total) by mouth daily. Anne Brome,  MD Taking Active   Fluticasone-Umeclidin-Vilant [facility] ELLIPTA) 200-62.5-25 MCG/INH AEPB [id number]  No Inhale 1 puff into the lungs daily. Deneise Lever, MD Taking Active   furosemide (LASIX) 20 MG tablet [id number] No Take 20 mg by mouth daily as needed for edema (for leg swelling).  [provider] Taking Active Self  gabapentin (NEURONTIN) 300 MG capsule [id number]  Take 2 capsules (600 mg total) by mouth 3 (three) times daily. [name], Kirsten, MD  Active   HYDROcodone-acetaminophen (NORCO/VICODIN) 5-325 MG tablet [id number] Yes Take 1 tablet by mouth daily as needed for severe pain (migraines). [name], Kirsten, MD  Active   ipratropium-albuterol (DUONEB) 0.5-2.5 (3) MG/3ML SOLN [id number]  Take 3 mLs by nebulization every 6 (six) hours as needed. Baird Lyons D, MD  Expired 05/27/20 2359   meloxicam (MOBIC) 15 MG tablet [id number] Yes Take 1 tablet (15 mg total) by mouth daily. [name], Kirsten, MD  Active   methylPREDNISolone acetate (DEPO-MEDROL) injection 80 mg [id number]   Baird Lyons D, MD  Active   mirtazapine (REMERON) 45 MG tablet [id number] No Take 1 tablet (45 mg total) by mouth at bedtime. [name], Kirsten, MD Taking Active   nystatin (MYCOSTATIN) 100000 UNIT/ML suspension [id number]  Take 5 mLs (500,000 Units total) by mouth 4 (four) times daily. Gargle, swish and spit [name], Kirsten, MD  Active   omeprazole (PRILOSEC) 40 MG capsule [id number]  Take 1 capsule (40 mg total) by mouth 2 (two) times daily. [name], Kirsten, MD  Active   orphenadrine (NORFLEX) 100 MG tablet [id number]  Take 1 tablet (100 mg total) by mouth 2 (two) times daily. [name], Kirsten, MD  Active   potassium chloride SA (KLOR-CON) 20 MEQ tablet [id number] No TAKE 1 TABLET BY MOUTH ONCE DAILY WITH FOOD [provider] Taking Active   primidone (MYSOLINE) 50 MG tablet [id number] No TAKE 1 TABLET BY MOUTH IN THE MORNING AND 1 AT BEDTIME [name], Kirsten, MD Taking Active   promethazine (PHENERGAN) 25 MG tablet [id number] No Take 1  tablet by mouth as needed for nausea/vomiting. [provider] Taking Active   rizatriptan (MAXALT-MLT) 10 MG disintegrating tablet [id number]  Take at onset of migraines, May repeat in 2 hours if not resolved [name], Kirsten, MD  Active   tamoxifen (NOLVADEX) 10 MG tablet [id number] No Take 2 tablets by mouth once daily Derwood Kaplan, MD Taking Active   temazepam (RESTORIL) 15 MG capsule [id number] No TAKE 1 CAPSULE BY MOUTH ONCE DAILY AT BEDTIME AS NEEDED [name], Kirsten, MD Taking Active   venlafaxine XR (EFFEXOR-XR) 75 MG 24 hr capsule [id number]  Take 2 capsules (150 mg total) by mouth daily with breakfast. [name], Kirsten, MD  Active   Vitamin D, Ergocalciferol, (DRISDOL) 1.25 MG (50000 UNIT) CAPS capsule [id number] No  [provider] Taking Active           Patient Active Problem List   Diagnosis Date Noted  . Left flank pain 04/18/2020  . Cough 12/08/2019  . Fever 12/08/2019  . Weight loss 08/28/2019  . Bowel habit changes 08/28/2019  . Left lower quadrant pain 07/04/2019  . Simple chronic bronchitis ([place]) 07/04/2019  . Seasonal allergic rhinitis due to pollen 07/04/2019  . Acute laryngopharyngitis 06/09/2019  . Oral thrush 06/09/2019  . Laryngitis 05/26/2019  . Cough with exposure to COVID-19 virus 05/06/2019  . Right lumbar radiculopathy 04/23/2019  . Psychogenic syncope 04/23/2019  . Pre-operative clearance 10/01/2017  . Malrotation of intestine 09/23/2017  . Right upper quadrant pain 09/23/2017  . Asthma 09/13/2017  . Depression 09/13/2017  . History of concussion 09/13/2017  .  Hypokalemia 07/03/2016  . COPD exacerbation ([place]) 07/03/2016  . Hyperlipidemia 07/03/2016  . Tobacco abuse   . Sleep disorder 07/02/2016  . Polypharmacy 07/02/2016  . Carotid artery disease ([place]) 05/04/2016  . Syncope and collapse 05/04/2016  . Acquired absence of both breasts and nipples 06/08/2015  . Genetic testing 06/02/2015  . Breast cancer, left ([place]) 06/02/2015  .  Family history of breast cancer in female 04/26/2015  . Family history of colon cancer 04/26/2015  . History of basal cell carcinoma 04/26/2015  . Breast cancer of upper-inner quadrant of left female breast ([place]) 04/15/2015  . Migraine aura without headache 02/08/2014  . Abnormal MRI of the head 02/08/2014    Immunization History  Administered Date(s) Administered  . Influenza-Unspecified 11/04/2018, 12/07/2019  . Moderna SARS-COV2 Booster Vaccination 03/01/2020  . Moderna Sars-Covid-2 Vaccination 05/06/2019, 06/03/2019  . Pneumococcal Conjugate-13 09/02/2013  . Pneumococcal Polysaccharide-23 12/10/2016  . Tdap 08/06/2013  . Zoster 08/03/2013    Conditions to be addressed/monitored:  Hyperlipidemia, COPD, Depression, Tobacco use, Allergic Rhinitis and migraine  There are no care plans that you recently modified to display for this patient.    Medication Assistance: {MEDASSISTANCEINFO:25044}  Patient's preferred pharmacy is:  [facility] [address], [place] [address] Phone: [phone] Fax: [facility] - [address] [place] [address] Phone: [phone] Fax: [phone]  [facility] - [place], [place] - [address] [place] [address] Phone: [phone] Fax: [phone]  Uses pill box? {Yes or If no, why not?:20788} Pt endorses ***% compliance  We discussed: Current pharmacy is preferred with insurance plan and patient is satisfied with pharmacy services Patient decided to: Continue current medication management strategy  Care Plan and Follow Up Patient Decision:  Patient agrees to Care Plan and Follow-up.  Plan: Telephone follow up appointment with care management team member scheduled for:  ***  ***   Current Barriers:  . {pharmacybarriers:24917}  Pharmacist  Clinical Goal(s):  Marland Kitchen Patient will {PHARMACYGOALCHOICES:24921} through collaboration with PharmD and provider.   Interventions: . 1:1 collaboration with Anne Brome, MD regarding development and update of comprehensive plan of care as evidenced by provider attestation and co-signature . Inter-disciplinary care team collaboration (see longitudinal plan of care) . Comprehensive medication review performed; medication list updated in electronic medical record  *** (Goal: ***) -{US controlled/uncontrolled:25276} -Current treatment  . ***furosemide 20 mg daily prn for edema  . Potassium chloride 20 meq daily with food -Medications previously tried: ***  -{CCMPHARMDINTERVENTION:25122}  Hyperlipidemia: (LDL goal < 55) -{US controlled/uncontrolled:25276} -Current treatment: . ***atorvastatin 20 mg daily  . Fenofibrate 160 mg daily  -Medications previously tried: ***  -Current dietary patterns: *** -Current exercise habits: *** -Educated on {CCM HLD Counseling:25126} -{CCMPHARMDINTERVENTION:25122}  *** (Goal: ***) -{US controlled/uncontrolled:25276} -Current treatment  . ***Aimovig 140 mg/ml every 30 days . maxalt-mlt 10 mg take 1 tablet at onset and repeat in 2 hours -Medications previously tried: ***  -{CCMPHARMDINTERVENTION:25122}  *** (Goal: ***) -{US controlled/uncontrolled:25276} -Current treatment  . ***meloxicam 15 mg daily  . Hydrocodone-acetaminophen 5-325 mg prn severe -Medications previously tried: ***  -{CCMPHARMDINTERVENTION:25122}   ***History of Breast Cancer (Goal: ***) -{US controlled/uncontrolled:25276} -Current treatment  . tamoxifen 10 mg 2 tablets by mouth once daily  -Medications previously tried: ***  -{CCMPHARMDINTERVENTION:25122}    COPD (Goal: control symptoms and prevent exacerbations) -{US controlled/uncontrolled:25276} -  Current treatment  . ***albuterol 1-2 puffs every 6 hours prn wheezing or shortness of breath . Cetirizine 10 mg daily   . Trelegy 200-62.5-25 mcg/inh 1 puff into the lungs daily  . Duoneb every 6 hours prn  -Medications previously tried: ***  -Gold Grade: {CHL HP [facility] Pharm COPD Gold YQMVH:[id number]} -Current COPD Classification:  {CHL HP Upstream Pharm COPD Classification:[phone]} -MMRC/CAT score: *** -Pulmonary function testing: *** -Exacerbations requiring treatment in last 6 months: *** -Patient {Actions; denies-reports:120008} consistent use of maintenance inhaler -Frequency of rescue inhaler use: *** -Counseled on {CCMINHALERCOUNSELING:25121} -{CCMPHARMDINTERVENTION:25122}  Depression/Anxiety (Goal: ***) -{US controlled/uncontrolled:25276} -Current treatment: . ***aripiprazole 5 mg daily  . Mirtazapine 45 mg daily at bedtime  . Temazepam 15 mg at bedtime prn  . Trazodone 100 mg at bedtime prn for sleep . Venlafaxine xr 75 mg daily with brekfast  -Medications previously tried/failed: *** -PHQ9: *** -GAD7: *** -Connected with *** for mental health support -Educated on {CCM mental health counseling:25127} -{CCMPHARMDINTERVENTION:25122}  Tobacco use (Goal ***) -{US controlled/uncontrolled:25276} -Previous quit attempts: *** -Current treatment  . *** -Patient smokes {Time to first cigarette:23873} -Patient triggers include: {Smoking Triggers:23882} -On a scale of 1-10, reports MOTIVATION to quit is *** -On a scale of 1-10, reports CONFIDENCE in quitting is *** -{Smoking Cessation Counseling:23883} -{CCMPHARMDINTERVENTION:25122}  Osteoporosis / Osteopenia (Goal ***) -{US controlled/uncontrolled:25276} -Last DEXA Scan: ***   T-Score femoral neck: ***  T-Score total hip: ***  T-Score lumbar spine: ***  T-Score forearm radius: ***  10-year probability of major osteoporotic fracture: ***  10-year probability of hip fracture: *** -Patient {is;is not an osteoporosis candidate:23886} -Current treatment  . ***vitamin d 50,000 units week -Medications previously tried: ***   -{Osteoporosis Counseling:23892} -{CCMPHARMDINTERVENTION:25122}  *** (Goal: ***) -{US controlled/uncontrolled:25276} -Current treatment  . ***omeprazole 40 mg bid  -Medications previously tried: ***  -{CCMPHARMDINTERVENTION:25122}   *** (Goal: ***) -{US controlled/uncontrolled:25276} -Current treatment  . ***gabapenin 300 mg tid . Orphenadrine 100 mg bid . Primidone 50 mg am and bedtime  -Medications previously tried: ***  -{CCMPHARMDINTERVENTION:25122}  Health Maintenance -Vaccine gaps: *** -Current therapy:  . ***promethazine 25 mg prn nausea/vomiting  . Nystatin 100,000 units/ml 5 mls qid. Gargle, swish and spit.  -Educated on {ccm supplement counseling:25128} -{CCM Patient satisfied:25129} -{CCMPHARMDINTERVENTION:25122}   Patient Goals/Self-Care Activities . Patient will:  - {pharmacypatientgoals:24919}  Follow Up Plan: {CM FOLLOW UP UXLK:44010}

## 2020-07-21 ENCOUNTER — Other Ambulatory Visit: Payer: Self-pay

## 2020-07-21 ENCOUNTER — Telehealth: Payer: Medicare Other

## 2020-07-21 DIAGNOSIS — R251 Tremor, unspecified: Secondary | ICD-10-CM

## 2020-07-21 MED ORDER — GABAPENTIN 300 MG PO CAPS: 600.0000 mg | ORAL_CAPSULE | Freq: Three times a day (TID) | ORAL | 1 refills | Status: DC

## 2020-07-21 MED ORDER — ARIPIPRAZOLE 5 MG PO TABS: 5.0000 mg | ORAL_TABLET | Freq: Every day | ORAL | 1 refills | Status: DC

## 2020-07-21 MED ORDER — MELOXICAM 15 MG PO TABS: 15.0000 mg | ORAL_TABLET | Freq: Every day | ORAL | 0 refills | Status: DC

## 2020-07-21 MED ORDER — OMEPRAZOLE 40 MG PO CPDR: 40.0000 mg | DELAYED_RELEASE_CAPSULE | Freq: Two times a day (BID) | ORAL | 1 refills | Status: DC

## 2020-07-21 MED ORDER — PRIMIDONE 50 MG PO TABS: ORAL_TABLET | ORAL | 1 refills | Status: DC

## 2020-07-21 MED ORDER — MIRTAZAPINE 45 MG PO TABS: 45.0000 mg | ORAL_TABLET | Freq: Every day | ORAL | 1 refills | Status: DC

## 2020-07-21 MED ORDER — TRAZODONE HCL 100 MG PO TABS: 100.0000 mg | ORAL_TABLET | Freq: Every evening | ORAL | 1 refills | Status: DC | PRN

## 2020-07-21 MED ORDER — ORPHENADRINE CITRATE ER 100 MG PO TB12: 100.0000 mg | ORAL_TABLET | Freq: Two times a day (BID) | ORAL | 3 refills | Status: DC | PRN

## 2020-07-21 MED ORDER — RIZATRIPTAN BENZOATE 10 MG PO TBDP: ORAL_TABLET | ORAL | 2 refills | Status: DC

## 2020-07-24 ENCOUNTER — Encounter: Payer: Self-pay | Admitting: Internal Medicine

## 2020-07-24 NOTE — Assessment & Plan Note (Signed)
Not motivated to stop Plan- education and explained resources for support

## 2020-07-24 NOTE — Assessment & Plan Note (Addendum)
Ongoing smoking and has run out of meds. Plan- education done. Refilling Ventolin. Depo 80, Trelegy, renew home nebulizer

## 2020-07-24 NOTE — Assessment & Plan Note (Signed)
She agrees to go forward with home sleep test after discussion

## 2020-07-25 ENCOUNTER — Other Ambulatory Visit: Payer: Self-pay | Admitting: Family Medicine

## 2020-07-26 ENCOUNTER — Other Ambulatory Visit: Payer: Self-pay

## 2020-07-26 NOTE — Telephone Encounter (Signed)
Pt calling states she is having back pain due to job. 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 She is asking for stronger pain medication for chronic back pain. Please advise next step.   Harrell Lark 07/26/20 4:27 PM

## 2020-07-27 ENCOUNTER — Ambulatory Visit: Payer: Medicare Other | Admitting: Nurse Practitioner

## 2020-07-27 MED ORDER — HYDROCODONE-ACETAMINOPHEN 5-325 MG PO TABS: 1.0000 | ORAL_TABLET | Freq: Every day | ORAL | 0 refills | Status: DC | PRN

## 2020-07-27 NOTE — Addendum Note (Signed)
Addended by: Alonna Minium on: 07/27/2020 08:44 AM   Modules accepted: Orders

## 2020-07-27 NOTE — Telephone Encounter (Signed)
Called Anne [name]. Anne [name] states she saw Dr. Ian Malkin back at the first of the year. She is having troubles sleeping w/ pain. With her job she struggles to find times off. Her patient weighs 30 pounds heavier than her.Did advise Anne [name] she contact Dr. Ian Malkin about pain. Did let her know how to use a sheet to turn patient easier to possibly help with back strain.  Anne [name] worried Dr will stop giving her pain medications. She did state she needs hydrocodone refilled for migraines.  Will pend this medication.   Attempted to contact Dr. Junius Creamer nurse about Anne [name]'s appointment. Was unsuccessful but requested call back to confirm.   Royce Macadamia, [place] 07/27/20 8:44 AM

## 2020-07-28 ENCOUNTER — Encounter: Payer: Self-pay | Admitting: Nurse Practitioner

## 2020-07-28 ENCOUNTER — Other Ambulatory Visit: Payer: Self-pay

## 2020-07-28 ENCOUNTER — Ambulatory Visit (INDEPENDENT_AMBULATORY_CARE_PROVIDER_SITE_OTHER): Payer: Medicare Other | Admitting: Nurse Practitioner

## 2020-07-28 VITALS — BP 118/58 | HR 82 | Temp 97.2°F | Ht 60.0 in | Wt 101.0 lb

## 2020-07-28 DIAGNOSIS — Z87442 Personal history of urinary calculi: Secondary | ICD-10-CM | POA: Diagnosis not present

## 2020-07-28 DIAGNOSIS — D649 Anemia, unspecified: Secondary | ICD-10-CM | POA: Diagnosis not present

## 2020-07-28 DIAGNOSIS — R319 Hematuria, unspecified: Secondary | ICD-10-CM | POA: Diagnosis not present

## 2020-07-28 DIAGNOSIS — R109 Unspecified abdominal pain: Secondary | ICD-10-CM

## 2020-07-28 LAB — POCT URINALYSIS DIPSTICK
Glucose, UA: NEGATIVE
Ketones, UA: NEGATIVE
Nitrite, UA: NEGATIVE
Protein, UA: POSITIVE — AB
Spec Grav, UA: >=1.03 — AB (ref 1.010–1.025)
Urobilinogen, UA: 0.2 E.U./dL
pH, UA: 5.5 (ref 5.0–8.0)

## 2020-07-28 NOTE — Patient Instructions (Signed)
Obtain stat labs at [facility]  We will call you with Ct of kidney appt time and urology referral appt Push fluids, especially water Follow-up pending lab and imaging results Hematuria, Adult Hematuria is blood in the urine. Blood may be visible in the urine, or it may be identified with a test. This condition can be caused by infections of the bladder, urethra, kidney, or prostate. Other possible causes include:  Kidney stones.  Cancer of the urinary tract.  Too much calcium in the urine.  Conditions that are passed from parent to child (inherited conditions).  Exercise that requires a lot of energy. Infections can usually be treated with medicine, and a kidney stone usually will pass through your urine. If neither of these is the cause of your hematuria, more tests may be needed to identify the cause of your symptoms. It is very important to tell your health care provider about any blood in your urine, even if it is painless or the blood stops without treatment. Blood in the urine, when it happens and then stops and then happens again, can be a symptom of a very serious condition, including cancer. There is no pain in the initial stages of many urinary cancers. Follow these instructions at home: Medicines  Take over-the-counter and prescription medicines only as told by your health care provider.  If you were prescribed an antibiotic medicine, take it as told by your health care provider. Do not stop taking the antibiotic even if you start to feel better. Eating and drinking  Drink enough fluid to keep your urine pale yellow. It is recommended that you drink 3-4 quarts (2.8-3.8 L) a day. If you have been diagnosed with an infection, drinking cranberry juice in addition to large amounts of water is recommended.  Avoid caffeine, tea, and carbonated beverages. These tend to irritate the bladder.  Avoid alcohol because it may irritate the prostate (in males). General  instructions  If you have been diagnosed with a kidney stone, follow your health care provider's instructions about straining your urine to catch the stone.  Empty your bladder often. Avoid holding urine for long periods of time.  If you are female: ? After a bowel movement, wipe from front to back and use each piece of toilet paper only once. ? Empty your bladder before and after sex.  Pay attention to any changes in your symptoms. Tell your health care provider about any changes or any new symptoms.  It is up to you to get the results of any tests. Ask your health care provider, or the department that is doing the test, when your results will be ready.  Keep all follow-up visits. This is important. Contact a health care provider if:  You develop back pain.  You have a fever or chills.  You have nausea or vomiting.  Your symptoms do not improve after 3 days.  Your symptoms get worse. Get help right away if:  You develop severe vomiting and are unable to take medicine without vomiting.  You develop severe pain in your back or abdomen even though you are taking medicine.  You pass a large amount of blood in your urine.  You pass blood clots in your urine.  You feel very weak or like you might faint.  You faint. Summary  Hematuria is blood in the urine. It has many possible causes.  It is very important that you tell your health care provider about any blood in your urine, even if it  is painless or the blood stops without treatment.  Take over-the-counter and prescription medicines only as told by your health care provider.  Drink enough fluid to keep your urine pale yellow. This information is not intended to replace advice given to you by your health care provider. Make sure you discuss any questions you have with your health care provider. Document Revised: 10/21/2019 Document Reviewed: 10/21/2019 Elsevier Patient Education  2021 [employer].

## 2020-07-28 NOTE — Progress Notes (Signed)
Acute Office Visit  Subjective:    Patient ID: Anne [name], female    DOB: 1945-12-16, 75 y.o.   MRN: [id number]  Chief Complaint  Patient presents with  . Back Pain    HPI Anne [name] is a 75 year old Caucasian female that presents with left flank pain, dysuria, and gross hematuria. Pt states pain radiates from left flank to anterior pelvis; describes pain a "throbbing". She tells me she has experienced Onset of symptoms was two days ago. Treatment has included pushing fluids and Hydrocodone that was prescribed by her orthopedic physician. She tells me she has a history of renal stones that required surgical intervention. Abd and pelvis CT from 05/13/20 reveals right renal stone without obstruction.   Past Medical History:  Diagnosis Date  . Asthma    daily inhalers  . Breast cancer ([place]) 05/2015   left  . COPD (chronic obstructive pulmonary disease) ([place])    denies SOB with ADLs; no O2  . Dental crowns present   . Depression   . History of concussion 01/01/2014  . History of kidney stones   . History of TIA (transient ischemic attack) 10/2014  . Hyperlipidemia   . Insomnia   . Migraine   . Nasal congestion 05/26/2015   will finish z-pak 05/27/2015  . Nonproductive cough 05/26/2015  . PONV (postoperative nausea and vomiting)    "long time ago"  none recently  . RLS (restless legs syndrome)   . Tobacco abuse   . Vitamin D deficiency   . Wears dentures    lower  . Wears partial dentures    upper    Past Surgical History:  Procedure Laterality Date  . ABDOMINAL HYSTERECTOMY    . APPENDECTOMY  2000  . BREAST BIOPSY Left   . BREAST RECONSTRUCTION WITH PLACEMENT OF TISSUE EXPANDER AND FLEX HD (ACELLULAR HYDRATED DERMIS) Bilateral 06/02/2015   Procedure: BILATERAL BREAST RECONSTRUCTION WITH PLACEMENT OF TISSUE EXPANDER AND  ACELLULAR HYDRATED DERMIS;  Surgeon: Irene Limbo, MD;  Location: [facility];  Service: Plastics;  Laterality: Bilateral;   . CARPAL TUNNEL RELEASE Bilateral   . CATARACT EXTRACTION Bilateral 09/2013  . KNEE ARTHROSCOPY Left   . 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 Perforation of small intestion requiring repair.   Marland Kitchen LOOP RECORDER INSERTION N/A 05/11/2016   Procedure: Loop Recorder Insertion;  Surgeon: Will Meredith Leeds, MD;  Location: [facility];  Service: Cardiovascular;  Laterality: N/A;  . MASTECTOMY W/ SENTINEL NODE BIOPSY Bilateral 06/02/2015   Procedure: BILATERAL MASTECTOMY WITH LEFT SENTINEL LYMPH NODE BIOPSY;  Surgeon: Stark Klein, MD;  Location: [facility];  Service: General;  Laterality: Bilateral;  . NASAL SEPTUM SURGERY     x 3  . PLANTAR FASCIA SURGERY Left   . REMOVAL OF BILATERAL TISSUE EXPANDERS WITH PLACEMENT OF BILATERAL BREAST IMPLANTS Bilateral 06/17/2015   Procedure: DEBRIDEMENT OF BILATERAL MASTECTOMY FLAP WITH BILATERAL TISSUE EXPANDER EXCHANGE ;  Surgeon: Irene Limbo, MD;  Location: [facility];  Service: Plastics;  Laterality: Bilateral;  . REMOVAL OF TISSUE EXPANDER Bilateral 07/12/2015   Procedure: REMOVAL OF BILATERAL TISSUE EXPANDERS;  Surgeon: Irene Limbo, MD;  Location: [facility];  Service: Plastics;  Laterality: Bilateral;    Family History  Problem Relation Age of Onset  . Cervical cancer Mother 55  . Colon cancer Maternal Grandfather        mets to stomach; dx. 67-70  .  Heart disease Father   . Prostate cancer Father 87  . Cervical cancer Maternal Grandmother        dx. 39s; treated with radium implant  . Lung cancer Sister 66       maternal half-sister dx. lung cancer, stage III  . Breast cancer Maternal Aunt   . Cervical cancer Sister 98       paternal half-sister; s/p TAH  . Spina bifida Grandchild   . Brain cancer Grandchild        grandson dx. at 20 mos, [facility] at [facility]  . Breast cancer Other 62       maternal  half-sister's daughter  . Cancer Maternal Aunt        d. 60; unspecified type - "began at back area and moved to vital organs"  . Cancer Maternal Uncle         late 96s; unspecified type; "started at back and moved to vital organs"    Social History   Socioeconomic History  . Marital status: Widowed    Spouse name: Not on file  . Number of children: 1  . Years of education: 70  . Highest education level: Not on file  Occupational History    Comment: [employer], retired  Tobacco Use  . Smoking status: Former Smoker    Packs/day: 0.25    Types: Cigarettes    Quit date: 03/21/2020    Years since quitting: 0.3  . Smokeless tobacco: Never Used  Vaping Use  . Vaping Use: Never used  Substance and Sexual Activity  . Alcohol use: Yes    Alcohol/week: 0.0 standard drinks    Comment: rarely  . Drug use: No  . Sexual activity: Not on file  Other Topics Concern  . Not on file  Social History Narrative   Lives alone, widow   Caffeine use- tea, 1 cup daily   Social Determinants of Health   Financial Resource Strain: Not on file  Food Insecurity: Not on file  Transportation Needs: Not on file  Physical Activity: Not on file  Stress: Not on file  Social Connections: Not on file  Intimate Partner Violence: Not on file    Outpatient Medications Prior to Visit  Medication Sig Dispense Refill  . albuterol (VENTOLIN HFA) 108 (90 Base) MCG/ACT inhaler INHALE 1 TO 2 PUFFS BY MOUTH EVERY 6 HOURS AS NEEDED FOR WHEEZING FOR SHORTNESS OF BREATH 18 g 12  . ARIPiprazole (ABILIFY) 5 MG tablet Take 1 tablet (5 mg total) by mouth daily. 90 tablet 1  . atorvastatin (LIPITOR) 20 MG tablet Take 1 tablet (20 mg total) by mouth daily. 90 tablet 0  . cetirizine (ZYRTEC) 10 MG tablet Take 1 tablet (10 mg total) by mouth daily. 30 tablet 11  . Cholecalciferol (VITAMIN D3) 50000 UNITS CAPS Take 50,000 Units by mouth once a week.     Eduard Roux (AIMOVIG) 140 MG/ML SOAJ Inject 140 mg into  the skin every 30 (thirty) days. 1 mL 5  . fenofibrate 160 MG tablet Take 1 tablet (160 mg total) by mouth daily. 90 tablet 0  . Fluticasone-Umeclidin-Vilant (TRELEGY ELLIPTA) 200-62.5-25 MCG/INH AEPB Inhale 1 puff into the lungs daily. 28 each 0  . furosemide (LASIX) 20 MG tablet Take 20 mg by mouth daily as needed for edema (for leg swelling).     . gabapentin (NEURONTIN) 300 MG capsule Take 2 capsules (600 mg total) by mouth 3 (three) times daily. 180 capsule 1  . HYDROcodone-acetaminophen (NORCO/VICODIN)  5-325 MG tablet Take 1 tablet by mouth daily as needed for severe pain (migraines). 30 tablet 0  . ipratropium-albuterol (DUONEB) 0.5-2.5 (3) MG/3ML SOLN Take 3 mLs by nebulization every 6 (six) hours as needed. 360 mL 12  . meloxicam (MOBIC) 15 MG tablet Take 1 tablet by mouth once daily 30 tablet 0  . mirtazapine (REMERON) 45 MG tablet Take 1 tablet (45 mg total) by mouth at bedtime. 90 tablet 1  . nystatin (MYCOSTATIN) 100000 UNIT/ML suspension Take 5 mLs (500,000 Units total) by mouth 4 (four) times daily. Gargle, swish and spit 60 mL 0  . omeprazole (PRILOSEC) 40 MG capsule Take 1 capsule (40 mg total) by mouth 2 (two) times daily. 180 capsule 1  . orphenadrine (NORFLEX) 100 MG tablet Take 1 tablet by mouth twice daily 60 tablet 0  . potassium chloride SA (KLOR-CON) 20 MEQ tablet TAKE 1 TABLET BY MOUTH ONCE DAILY WITH FOOD    . primidone (MYSOLINE) 50 MG tablet TAKE 1 TABLET BY MOUTH IN THE MORNING AND 1 AT BEDTIME 180 tablet 1  . promethazine (PHENERGAN) 25 MG tablet Take 1 tablet by mouth as needed for nausea/vomiting.    . rizatriptan (MAXALT-MLT) 10 MG disintegrating tablet Take at onset of migraines, May repeat in 2 hours if not resolved 10 tablet 2  . tamoxifen (NOLVADEX) 10 MG tablet Take 2 tablets by mouth once daily 90 tablet 3  . temazepam (RESTORIL) 15 MG capsule TAKE 1 CAPSULE BY MOUTH ONCE DAILY AT BEDTIME AS NEEDED 45 capsule 0  . traZODone (DESYREL) 100 MG tablet Take 1  tablet (100 mg total) by mouth at bedtime as needed for sleep. 90 tablet 1  . venlafaxine XR (EFFEXOR-XR) 75 MG 24 hr capsule Take 2 capsules (150 mg total) by mouth daily with breakfast. 1 capsule 0  . Vitamin D, Ergocalciferol, (DRISDOL) 1.25 MG (50000 UNIT) CAPS capsule      Facility-Administered Medications Prior to Visit  Medication Dose Route Frequency Provider Last Rate Last Admin  . methylPREDNISolone acetate (DEPO-MEDROL) injection 80 mg  80 mg Intramuscular Once Deneise Lever, MD        Allergies  Allergen Reactions  . Rocephin [Ceftriaxone Sodium In Dextrose] Shortness Of Breath and Rash  . Clindamycin/Lincomycin Nausea And Vomiting  . Lyrica [Pregabalin] Other (See Comments)    PERSONALITY CHANGE    Review of Systems  Constitutional: Positive for fatigue. Negative for chills and fever.  HENT: Negative for congestion, ear pain, postnasal drip, rhinorrhea, sinus pressure, sinus pain and sore throat.   Eyes: Negative.   Respiratory: Positive for shortness of breath. Negative for cough.   Cardiovascular: Negative for chest pain.  Gastrointestinal: Positive for abdominal pain. Negative for diarrhea and nausea.  Endocrine: Negative.   Genitourinary: Positive for decreased urine volume, difficulty urinating (hesitancy), dysuria, flank pain (left ), frequency, hematuria and urgency.  Musculoskeletal: Positive for back pain.  Skin: Negative.   Allergic/Immunologic: Negative.   Neurological: Negative for dizziness and headaches.  Hematological: Negative.   Psychiatric/Behavioral: Negative.        Objective:    Physical Exam Vitals reviewed.  Constitutional:      Appearance: Normal appearance.  HENT:     Head: Normocephalic.  Cardiovascular:     Rate and Rhythm: Normal rate and regular rhythm.     Pulses: Normal pulses.     Heart sounds: Normal heart sounds.  Pulmonary:     Effort: Pulmonary effort is normal.     Breath sounds:  Normal breath sounds.  Abdominal:      General: Bowel sounds are normal.     Palpations: Abdomen is soft.     Tenderness: There is abdominal tenderness. There is left CVA tenderness and guarding. There is no right CVA tenderness.  Musculoskeletal:        General: Tenderness (left flank) present.  Skin:    General: Skin is warm and dry.     Capillary Refill: Capillary refill takes less than 2 seconds.  Neurological:     General: No focal deficit present.     Mental Status: She is alert and oriented to person, place, and time.  Psychiatric:        Mood and Affect: Mood normal.        Behavior: Behavior normal.     Temp (!) 97.2 F (36.2 C)   Ht 5' (1.524 m)   Wt 101 lb (45.8 kg)   BMI 19.73 kg/m  Wt Readings from Last 3 Encounters:  07/28/20 101 lb (45.8 kg)  06/22/20 97 lb (44 kg)  06/02/20 95 lb (43.1 kg)    Health Maintenance Due  Topic Date Due  . Hepatitis C Screening  Never done  . MAMMOGRAM  02/17/2017  . COVID-19 Vaccine (4 - Booster for Moderna series) 05/30/2020    Lab Results  Component Value Date   TSH 1.320 03/01/2020   Lab Results  Component Value Date   WBC 9.5 05/05/2020   HGB 11.8 05/05/2020   HCT 36.7 05/05/2020   MCV 87 05/05/2020   PLT 263 05/05/2020   Lab Results  Component Value Date   NA 139 05/05/2020   K 5.2 05/05/2020   CO2 23 05/05/2020   GLUCOSE 86 05/05/2020   BUN 35 (H) 05/05/2020   CREATININE 0.88 05/05/2020   BILITOT 0.4 05/05/2020   ALKPHOS 139 (H) 05/05/2020   AST 25 05/05/2020   ALT 26 05/05/2020   PROT 6.0 05/05/2020   ALBUMIN 3.4 (L) 05/05/2020   CALCIUM 8.7 05/05/2020   ANIONGAP 3 (L) 07/03/2016   EGFR 69 05/05/2020   Lab Results  Component Value Date   CHOL 150 02/01/2020   Lab Results  Component Value Date   HDL 59 02/01/2020   Lab Results  Component Value Date   LDLCALC 74 02/01/2020   Lab Results  Component Value Date   TRIG 91 02/01/2020   Lab Results  Component Value Date   CHOLHDL 2.5 02/01/2020        Assessment &  Plan:   1. Hematuria, unspecified type - POCT urinalysis dipstick - Urine Culture - Ambulatory referral to Urology - CBC with Differential/Platelet-paper requisition given for stat labs at [facility] metabolic panel-paper requisition given [facility] stat labs at [facility]  2. Left flank pain - Ambulatory referral to Urology - CBC with Differential/Platelet - Comprehensive metabolic panel  3. History of kidney stones - Ambulatory referral to Urology - CBC with Differential/Platelet - Comprehensive metabolic panel   Obtain stat labs at [facility]  We will call you with Ct of kidney appt time and urology referral appt Push fluids, especially water Follow-up pending lab and imaging results   I spent 30 minutes dedicated to the care of this patient on the date of this encounter to include face-to-face time with the patient, as well as: EMR review and diagnostic testing.   I, [name], NP, have reviewed all documentation for this visit. The documentation on 07/28/20 for the exam, diagnosis, procedures,  and orders are all accurate and complete.  Follow-up: Pending lab and imaging results  An After Visit Summary was printed and given to the patient.  [name], NP [facility] [phone]    I,Katherina A Bramblett,acting as a scribe for [name], NP.,have documented all relevant documentation on the behalf of [name], NP,as directed by  [name], NP while in the presence of [name], NP.

## 2020-07-29 LAB — URINE CULTURE

## 2020-08-13 ENCOUNTER — Other Ambulatory Visit: Payer: Self-pay | Admitting: Family Medicine

## 2020-08-16 ENCOUNTER — Other Ambulatory Visit: Payer: Self-pay

## 2020-08-16 MED ORDER — FUROSEMIDE 20 MG PO TABS: 20.0000 mg | ORAL_TABLET | Freq: Every day | ORAL | 0 refills | Status: DC | PRN

## 2020-08-18 ENCOUNTER — Ambulatory Visit: Payer: Medicare Other | Admitting: Internal Medicine

## 2020-08-22 ENCOUNTER — Other Ambulatory Visit: Payer: Self-pay

## 2020-08-22 MED ORDER — ALBUTEROL SULFATE HFA 108 (90 BASE) MCG/ACT IN AERS: INHALATION_SPRAY | RESPIRATORY_TRACT | 12 refills | Status: DC

## 2020-08-30 ENCOUNTER — Other Ambulatory Visit: Payer: Self-pay

## 2020-08-30 MED ORDER — AIMOVIG 140 MG/ML ~~LOC~~ SOAJ: 140.0000 mg | SUBCUTANEOUS | 5 refills | Status: DC

## 2020-08-30 NOTE — Telephone Encounter (Signed)
Pt calling to make Dr aware she received letter from [facility]. Aimovig 140 mg will be covered at participating pharmacies but does not list those pharmacies. Pt would like to try [facility] or [facility] in [place] first to receive this script. Have pended script.   Royce Macadamia, [place] 08/30/20 10:10 AM

## 2020-09-14 ENCOUNTER — Other Ambulatory Visit: Payer: Self-pay | Admitting: Family Medicine

## 2020-09-14 ENCOUNTER — Telehealth: Payer: Self-pay

## 2020-09-14 DIAGNOSIS — R251 Tremor, unspecified: Secondary | ICD-10-CM

## 2020-09-14 MED ORDER — PRIMIDONE 50 MG PO TABS: ORAL_TABLET | ORAL | 1 refills | Status: DC

## 2020-09-14 NOTE — Telephone Encounter (Signed)
Called pt. Pt VU will call back w/ name of medication. Did not know names earlier when called in.   Harrell Lark 09/14/20 3:57 PM

## 2020-09-14 NOTE — Telephone Encounter (Signed)
Pt calling requesting refills on tremor medication and seizure medication. Also asking for something different for nerves as she is not being able to cope with stress.   Anne [name], Anne [name] 09/14/20 1:45 PM

## 2020-09-14 NOTE — Telephone Encounter (Signed)
Pt returned call. Pt states it was primidone.   Harrell Lark 09/14/20 4:39 PM

## 2020-09-21 NOTE — Progress Notes (Deleted)
HPI F smoker referred for sleep evaluation with concern of chronic insomnia, COPD  complicated by  Migraine, Carotid Artery Disease, Chronic Bronchitis/ COPD, Allergic Rhinitis, Tobacco Abuse, Restless Legs, Weight Loss, hx L Breast Cancer, R Lumbar Radiculopathy,   ======================================================   04/20/20- 74 yoF smoker referred for sleep evaluation with concern of chronic Insomnia, COPD  complicated by  Migraine, Carotid Artery Disease, Chronic Bronchitis/ COPD, Allergic Rhinitis, Tobacco Abuse, Restless Legs, Weight Loss, hx L Breast Cancer, R Lumbar Radiculopathy,  Meds include amitriptyline 75, Gabapentin 300 tid, Remeron 45, Temazepam 15, Zanaflex,  Norco,  Trazodone 100 hs prn sleep,  Trelegy 200, Ventolin hfa, benzonatate, Neb Duoneb(rarely used) Covid vax- 3 Moderna Flu vax- had We had requested reports of PFT and CXR from [facility], getting her to sign release today. HST- ordered in November. We could never her reach her to set this up. She didn't return calls.  Out of her breathing meds. Comes today wheezing and coughing, but mainly uncomfortable with migraine. She continues to smoke against advice.  Says she was on 6 antibiotics between August and 2 weeks ago for sinus infection which finally resolved.  Denies coughing discolored sputum. Pending [facility] chest at [facility].  7/211/22- 75 yoF Smoker referred for sleep evaluation with concern of chronic Insomnia, COPD  complicated by  Migraine, Carotid Artery Disease, Chronic Bronchitis/ COPD, Allergic Rhinitis, Tobacco Abuse, Restless Legs, Weight Loss, hx L Breast Cancer, R Lumbar Radiculopathy,  - amitriptyline 75, Gabapentin 300 tid, Remeron 45, Temazepam 15, Zanaflex,  Norco,  Trazodone 100 hs prn sleep,  Trelegy 200, Ventolin hfa, benzonatate, Neb Duoneb(rarely used)  We had requested results of PFT and CT chest from [place]- not in chart. Still has not done HST schedule in Feb     ROS-see HPI   + =  positive Constitutional:    +weight loss, night sweats, fevers, chills, fatigue, lassitude. HEENT:   + headaches, difficulty swallowing, tooth/dental problems, sore throat,       sneezing, itching, ear ache, +nasal congestion, post nasal drip, snoring CV:    chest pain, orthopnea, PND, +swelling in lower extremities, anasarca,                                  dizziness, palpitations Resp:   +shortness of breath with exertion or at rest.               + productive cough,   non-productive cough, coughing up of blood.              change in color of mucus.  wheezing.   Skin:    rash or lesions. GI:  No-   heartburn, indigestion, +abdominal pain, nausea, vomiting, diarrhea,                 change in bowel habits,+ loss of appetite GU: dysuria, change in color of urine, no urgency or frequency.   flank pain. MS:   joint pain, stiffness, decreased range of motion, back pain. Neuro-     nothing unusual Psych:  change in mood or affect.  depression or anxiety.   memory loss.  OBJ- Physical Exam General- Alert, Oriented, Affect-appropriate, Distress- none acute, +petite/ slender Skin- rash-none, lesions- none, excoriation- none Lymphadenopathy- none Head- atraumatic            Eyes- Gross vision intact, PERRLA, conjunctivae and secretions clear  Ears- Hearing, canals-normal            Nose- Clear, no-Septal dev, mucus, polyps, erosion, perforation             Throat- Mallampati II , mucosa clear , drainage- none, tonsils- atrophic Neck- flexible , trachea midline, no stridor , thyroid nl, carotid no bruit Chest - symmetrical excursion , unlabored           Heart/CV- RRR , no murmur , no gallop  , no rub, nl s1 s2                           - JVD- none , edema- none, stasis changes- none, varices- none           Lung- clear to P&A/+ diminished, wheeze+, cough+ active , dullness-none, rub- none           Chest wall- + loop recorder L Abd-  Br/ Gen/ Rectal- Not done, not  indicated Extrem- cyanosis- none, clubbing, none, atrophy- none, strength- nl Neuro- grossly intact to observation

## 2020-09-22 ENCOUNTER — Ambulatory Visit: Payer: Medicare Other | Admitting: Internal Medicine

## 2020-10-01 NOTE — Progress Notes (Signed)
Subjective:  Patient ID: Anne [name], female    DOB: 17-Feb-1946  Age: 75 y.o. MRN: [id number]  CC: back pain, migraines  HPI Chronic midline low back pain without sciatica: At previous visit recommended pt call Dr. Towanda Octave office to review her lumbar mri and receive his recommendations. Pt was having issues with ataxia and recurrent falls. I had recommended PT, but I do not believe pt pursued these recommendations. Back pain is not good. 8/10. Not taking anything for it.    Migraine with aura and without status migrainosus, not intractable Last changes were to increase aimovig to 140 mg monthly. Insurance did not pay enough for it. Has migraines twice a week. Takes imitrex or maxalt for prn migraines. Only use hydrocodone/apap sparingly.    Moderate recurrent major depression ([place]) Had Improved, but pt called back complaining of increased stress and wanting something for her nerves. I did prescribe primidone for her tremor which helped. Pt is currently on trazodone, temazepam, abilify, and effexor xr. I recommended she call Dr. Vita Barley with [facility]. Increased abilify 5 mg one twice a day. A small amount of improvement.    COPD mixed type ([place]) Inhalers: trelegy one inhalation daily. Ventolin hfa using 4-5 times a day.   Restarted smoking up to 1/2 ppd. Pt has seen Dr Annamaria Boots.    Frequent falls/Ataxia: negative cardiac and central neurological etiology identified. Rec PT in past..   Constipation: Increased MiraLAX to twice a day and bowels are fairly good.  Current Outpatient Medications on File Prior to Visit  Medication Sig Dispense Refill   albuterol (VENTOLIN HFA) 108 (90 Base) MCG/ACT inhaler INHALE 1 TO 2 PUFFS BY MOUTH EVERY 6 HOURS AS NEEDED FOR WHEEZING FOR SHORTNESS OF BREATH 18 g 12   ARIPiprazole (ABILIFY) 5 MG tablet Take 1 tablet (5 mg total) by mouth daily. (Patient taking differently: Take 5 mg by mouth 2 (two) times daily.) 90 tablet 1   atorvastatin  (LIPITOR) 20 MG tablet Take 1 tablet (20 mg total) by mouth daily. 90 tablet 0   cetirizine (ZYRTEC) 10 MG tablet Take 1 tablet (10 mg total) by mouth daily. 30 tablet 11   Cholecalciferol (VITAMIN D3) 50000 UNITS CAPS Take 50,000 Units by mouth once a week.      fenofibrate 160 MG tablet Take 1 tablet (160 mg total) by mouth daily. 90 tablet 0   Fluticasone-Umeclidin-Vilant (TRELEGY ELLIPTA) 200-62.5-25 MCG/INH AEPB Inhale 1 puff into the lungs daily. 28 each 0   furosemide (LASIX) 20 MG tablet Take 1 tablet (20 mg total) by mouth daily as needed for edema (for leg swelling). 30 tablet 0   gabapentin (NEURONTIN) 300 MG capsule Take 2 capsules (600 mg total) by mouth 3 (three) times daily. 180 capsule 1   ipratropium-albuterol (DUONEB) 0.5-2.5 (3) MG/3ML SOLN Take 3 mLs by nebulization every 6 (six) hours as needed. 360 mL 12   meloxicam (MOBIC) 15 MG tablet TAKE 1 TABLET EVERY DAY 90 tablet 1   mirtazapine (REMERON) 45 MG tablet TAKE 1 TABLET (45 MG TOTAL) BY MOUTH AT BEDTIME. 90 tablet 1   nystatin (MYCOSTATIN) 100000 UNIT/ML suspension Take 5 mLs (500,000 Units total) by mouth 4 (four) times daily. Gargle, swish and spit (Patient not taking: Reported on 10/03/2020) 60 mL 0   omeprazole (PRILOSEC) 40 MG capsule Take 1 capsule (40 mg total) by mouth 2 (two) times daily. 180 capsule 1   orphenadrine (NORFLEX) 100 MG tablet Take 1 tablet by mouth twice daily  60 tablet 0   potassium chloride SA (KLOR-CON) 20 MEQ tablet TAKE 1 TABLET BY MOUTH ONCE DAILY WITH FOOD     primidone (MYSOLINE) 50 MG tablet TAKE 1 TABLET BY MOUTH IN THE MORNING AND 1 AT BEDTIME 180 tablet 1   promethazine (PHENERGAN) 25 MG tablet Take 1 tablet by mouth as needed for nausea/vomiting.     rizatriptan (MAXALT-MLT) 10 MG disintegrating tablet Take at onset of migraines, May repeat in 2 hours if not resolved 10 tablet 2   tamoxifen (NOLVADEX) 10 MG tablet Take 2 tablets by mouth once daily 90 tablet 3   temazepam (RESTORIL) 15 MG  capsule TAKE 1 CAPSULE BY MOUTH ONCE DAILY AT BEDTIME AS NEEDED 45 capsule 0   traZODone (DESYREL) 100 MG tablet Take 1 tablet (100 mg total) by mouth at bedtime as needed for sleep. 90 tablet 1   venlafaxine XR (EFFEXOR-XR) 75 MG 24 hr capsule Take 2 capsules (150 mg total) by mouth daily with breakfast. 1 capsule 0   Vitamin D, Ergocalciferol, (DRISDOL) 1.25 MG (50000 UNIT) CAPS capsule      Current Facility-Administered Medications on File Prior to Visit  Medication Dose Route Frequency Provider Last Rate Last Admin   methylPREDNISolone acetate (DEPO-MEDROL) injection 80 mg  80 mg Intramuscular Once Baird Lyons D, MD       Past Medical History:  Diagnosis Date   Asthma    daily inhalers   Breast cancer ([place]) 05/2015   left   COPD (chronic obstructive pulmonary disease) ([place])    denies SOB with ADLs; no O2   Dental crowns present    Depression    History of concussion 01/01/2014   History of kidney stones    History of TIA (transient ischemic attack) 10/2014   Hyperlipidemia    Insomnia    Migraine    Nasal congestion 05/26/2015   will finish z-pak 05/27/2015   Nonproductive cough 05/26/2015   PONV (postoperative nausea and vomiting)    "long time ago"  none recently   RLS (restless legs syndrome)    Tobacco abuse    Vitamin D deficiency    Wears dentures    lower   Wears partial dentures    upper   Past Surgical History:  Procedure Laterality Date   ABDOMINAL HYSTERECTOMY     APPENDECTOMY  2000   BREAST BIOPSY Left    BREAST RECONSTRUCTION WITH PLACEMENT OF TISSUE EXPANDER AND FLEX HD (ACELLULAR HYDRATED DERMIS) Bilateral 06/02/2015   Procedure: BILATERAL BREAST RECONSTRUCTION WITH PLACEMENT OF TISSUE EXPANDER AND  ACELLULAR HYDRATED DERMIS;  Surgeon: Irene Limbo, MD;  Location: [facility];  Service: Plastics;  Laterality: Bilateral;   CARPAL TUNNEL RELEASE Bilateral    CATARACT EXTRACTION Bilateral 09/2013   KNEE ARTHROSCOPY Left    LAPAROSCOPIC  ABDOMINAL EXPLORATION N/A 10/2017   LAPAROSCOPIC LYSIS OF CONGENITAL ADHESIVE BAND/LADD'S BANDS WIH INTRAOPERATIVE CHOLANGIOGRAPHY. Perforation of small intestion requiring repair.    LOOP RECORDER INSERTION N/A 05/11/2016   Procedure: Loop Recorder Insertion;  Surgeon: Will Meredith Leeds, MD;  Location: [facility];  Service: Cardiovascular;  Laterality: N/A;   MASTECTOMY W/ SENTINEL NODE BIOPSY Bilateral 06/02/2015   Procedure: BILATERAL MASTECTOMY WITH LEFT SENTINEL LYMPH NODE BIOPSY;  Surgeon: Stark Klein, MD;  Location: [facility];  Service: General;  Laterality: Bilateral;   NASAL SEPTUM SURGERY     x 3   PLANTAR FASCIA SURGERY Left    REMOVAL OF BILATERAL TISSUE EXPANDERS WITH PLACEMENT  OF BILATERAL BREAST IMPLANTS Bilateral 06/17/2015   Procedure: DEBRIDEMENT OF BILATERAL MASTECTOMY FLAP WITH BILATERAL TISSUE EXPANDER EXCHANGE ;  Surgeon: Irene Limbo, MD;  Location: [facility];  Service: Plastics;  Laterality: Bilateral;   REMOVAL OF TISSUE EXPANDER Bilateral 07/12/2015   Procedure: REMOVAL OF BILATERAL TISSUE EXPANDERS;  Surgeon: Irene Limbo, MD;  Location: [facility];  Service: Plastics;  Laterality: Bilateral;    Family History  Problem Relation Age of Onset   Cervical cancer Mother 19   Colon cancer Maternal Grandfather        mets to stomach; dx. 67-70   Heart disease Father    Prostate cancer Father 24   Cervical cancer Maternal Grandmother        dx. 65s; treated with radium implant   Lung cancer Sister 74       maternal half-sister dx. lung cancer, stage III   Breast cancer Maternal Aunt    Cervical cancer Sister 63       paternal half-sister; s/p TAH   Spina bifida Grandchild    Brain cancer Grandchild        grandson dx. at 20 mos, treated at [facility] Other 34       maternal half-sister's daughter   Cancer Maternal Aunt        d. 11; unspecified type - "began at back area and moved to vital organs"   Cancer  Maternal Uncle         late 58s; unspecified type; "started at back and moved to vital organs"   Social History   Socioeconomic History   Marital status: Widowed    Spouse name: Not on file   Number of children: 1   Years of education: 77   Highest education level: Not on file  Occupational History    Comment: [employer], retired  Tobacco Use   Smoking status: Former    Packs/day: 0.25    Types: Cigarettes    Quit date: 03/21/2020    Years since quitting: 0.5   Smokeless tobacco: Never  Vaping Use   Vaping Use: Never used  Substance and Sexual Activity   Alcohol use: Yes    Alcohol/week: 0.0 standard drinks    Comment: rarely   Drug use: No   Sexual activity: Not on file  Other Topics Concern   Not on file  Social History Narrative   Lives alone, widow   Caffeine use- tea, 1 cup daily   Social Determinants of Health   Financial Resource Strain: Not on file  Food Insecurity: Not on file  Transportation Needs: Not on file  Physical Activity: Not on file  Stress: Not on file  Social Connections: Not on file    Review of Systems  Constitutional:  Positive for fatigue. Negative for chills and fever.  HENT:  Negative for congestion, ear pain and sore throat.   Respiratory:  Positive for cough and shortness of breath.   Cardiovascular:  Negative for chest pain.  Gastrointestinal:  Negative for abdominal pain, constipation, diarrhea, nausea and vomiting.  Genitourinary:  Negative for dysuria and urgency.  Musculoskeletal:  Positive for back pain and myalgias. Negative for arthralgias.  Skin:  Negative for rash.  Neurological:  Negative for dizziness and headaches.  Psychiatric/Behavioral:  Positive for agitation, decreased concentration, dysphoric mood, sleep disturbance and suicidal ideas. The patient is nervous/anxious.     Objective:  BP 140/76   Pulse 76   Temp 97.9 F (36.6 C)  Ht 5' (1.524 m)   Wt 100 lb 3.2 oz (45.5 kg)   BMI 19.57 kg/m    BP/Weight 10/03/2020 07/28/2020 0000000  Systolic BP [id number]  Diastolic BP 76 58 60  Wt. (Lbs) 100.2 101 97  BMI 19.57 19.73 18.33    Physical Exam Vitals reviewed.  Constitutional:      General: She is not in acute distress.    Appearance: Normal appearance.  HENT:     Right Ear: Tympanic membrane and ear canal normal.     Left Ear: Tympanic membrane and ear canal normal.     Nose: Nose normal. No congestion or rhinorrhea.  Eyes:     Conjunctiva/sclera: Conjunctivae normal.  Neck:     Thyroid: No thyroid mass.  Cardiovascular:     Rate and Rhythm: Normal rate and regular rhythm.     Pulses: Normal pulses.     Heart sounds: No murmur heard. Pulmonary:     Effort: Pulmonary effort is normal.     Breath sounds: Normal breath sounds.  Abdominal:     General: Bowel sounds are normal.     Palpations: Abdomen is soft. There is no mass.     Tenderness: There is no abdominal tenderness.  Musculoskeletal:        General: Normal range of motion.  Lymphadenopathy:     Cervical: No cervical adenopathy.  Skin:    General: Skin is warm and dry.  Neurological:     Mental Status: She is alert and oriented to person, place, and time.     Cranial Nerves: No cranial nerve deficit.  Psychiatric:        Mood and Affect: Mood normal.        Behavior: Behavior normal.   PHQ9 SCORE ONLY 10/03/2020 10/03/2020 06/02/2020  PHQ-9 Total Score '20 2 20   '$ MMSE - Mini Mental State Exam 10/03/2020 04/14/2020 03/01/2020  Orientation to time '5 5 5  '$ Orientation to Place '5 5 5  '$ Registration '3 3 3  '$ Registration-comments - - Hat, [place], Pen  Attention/ Calculation '5 5 5  '$ Recall '1 2 2  '$ Recall-comments - - Could not recall "Diona Foley"  Language- name 2 objects '2 2 2  '$ Language- repeat '1 1 1  '$ Language- follow 3 step command '3 3 3  '$ Language- read & follow direction '1 1 1  '$ Write a sentence '1 1 1  '$ Copy design 1 1 0  Total score '28 29 28    '$ Diabetic Foot Exam - Simple   No data filed      Lab Results   Component Value Date   WBC 5.8 10/03/2020   HGB 11.8 10/03/2020   HCT 38.4 10/03/2020   PLT 212 10/03/2020   GLUCOSE 79 10/03/2020   CHOL 201 (H) 10/03/2020   TRIG 138 10/03/2020   HDL 54 10/03/2020   LDLCALC 123 (H) 10/03/2020   ALT 15 10/03/2020   AST 23 10/03/2020   NA 142 10/03/2020   K 4.1 10/03/2020   CL 102 10/03/2020   CREATININE 0.79 10/03/2020   BUN 16 10/03/2020   CO2 26 10/03/2020   TSH 1.320 03/01/2020      Assessment & Plan:   1. COPD mixed type [facility]) The current medical regimen is effective;  continue present plan and medications.  2. Severe episode of recurrent major depressive disorder, without psychotic features ([place]) Pt to call Dr. Fredirick Maudlin office to set up counseling.  Increased abilify to 10 mg once daily one week ago.  I recommended the patient give this more time.  Contracted for safety.   3. Chronic migraine without aura without status migrainosus, not intractable The current medical regimen is effective;  continue present plan and medications.  4. Mixed hyperlipidemia Not at goal. Recommended start zetia 10 mg once daily. Continue lipitor.  - CBC with Differential/Platelet - Comprehensive metabolic panel - Lipid panel  5. Vitamin D deficiency Continue vitamin d.   6. Right lumbar radiculopathy  Recommended pt call Dr. Donivan Scull for follow up. Encouraged physical therapy. Pt to call if she wishes to go.   Meds ordered this encounter  Medications   HYDROcodone-acetaminophen (NORCO/VICODIN) 5-325 MG tablet    Sig: Take 1 tablet by mouth daily as needed for severe pain (migraines).    Dispense:  30 tablet    Refill:  0    Orders Placed This Encounter  Procedures   CBC with Differential/Platelet   Comprehensive metabolic panel   Lipid panel   Cardiovascular Risk Assessment     Follow-up: Return in about 3 months (around 01/03/2021) for not fasting.  An After Visit Summary was printed and given to the patient.  [name], MD [facility] [phone]

## 2020-10-02 ENCOUNTER — Other Ambulatory Visit: Payer: Self-pay | Admitting: Family Medicine

## 2020-10-03 ENCOUNTER — Encounter: Payer: Self-pay | Admitting: Family Medicine

## 2020-10-03 ENCOUNTER — Ambulatory Visit (INDEPENDENT_AMBULATORY_CARE_PROVIDER_SITE_OTHER): Payer: Medicare Other | Admitting: Family Medicine

## 2020-10-03 VITALS — BP 140/76 | HR 76 | Temp 97.9°F | Ht 60.0 in | Wt 100.2 lb

## 2020-10-03 DIAGNOSIS — F332 Major depressive disorder, recurrent severe without psychotic features: Secondary | ICD-10-CM | POA: Diagnosis not present

## 2020-10-03 DIAGNOSIS — F330 Major depressive disorder, recurrent, mild: Secondary | ICD-10-CM

## 2020-10-03 DIAGNOSIS — E559 Vitamin D deficiency, unspecified: Secondary | ICD-10-CM

## 2020-10-03 DIAGNOSIS — M5416 Radiculopathy, lumbar region: Secondary | ICD-10-CM

## 2020-10-03 DIAGNOSIS — J449 Chronic obstructive pulmonary disease, unspecified: Secondary | ICD-10-CM

## 2020-10-03 DIAGNOSIS — G43709 Chronic migraine without aura, not intractable, without status migrainosus: Secondary | ICD-10-CM

## 2020-10-03 DIAGNOSIS — E782 Mixed hyperlipidemia: Secondary | ICD-10-CM | POA: Diagnosis not present

## 2020-10-03 MED ORDER — HYDROCODONE-ACETAMINOPHEN 5-325 MG PO TABS: 1.0000 | ORAL_TABLET | Freq: Every day | ORAL | 0 refills | Status: DC | PRN

## 2020-10-03 NOTE — Patient Instructions (Addendum)
Chronic midline low back pain without sciatica: Call Dr. Donivan Scull for follow up. Consider PT.   Migraine with aura and without status migrainosus, not intractable imitrex or maxalt for prn migraines. Only use hydrocodone/apap sparingly.    Moderate recurrent major depression ([place]) Continue current medicines. Call Dr. Vita Barley here in town for counseling.    COPD mixed type ([place]) Inhalers: trelegy one inhalation daily. Use ventolin before going out into hot humid days.  Recommend quit smoking. Call Dr Annamaria Boots for follow up of copd and for sleep study.    Frequent falls/Ataxia:  Rec PT.

## 2020-10-04 LAB — CBC WITH DIFFERENTIAL/PLATELET
Basophils Absolute: 0 10*3/uL (ref 0.0–0.2)
Basos: 1 %
EOS (ABSOLUTE): 0.3 10*3/uL (ref 0.0–0.4)
Eos: 5 %
Hematocrit: 38.4 % (ref 34.0–46.6)
Hemoglobin: 11.8 g/dL (ref 11.1–15.9)
Immature Grans (Abs): 0 10*3/uL (ref 0.0–0.1)
Immature Granulocytes: 0 %
Lymphocytes Absolute: 1.9 10*3/uL (ref 0.7–3.1)
Lymphs: 32 %
MCH: 27 pg (ref 26.6–33.0)
MCHC: 30.7 g/dL — ABNORMAL LOW (ref 31.5–35.7)
MCV: 88 fL (ref 79–97)
Monocytes Absolute: 0.6 10*3/uL (ref 0.1–0.9)
Monocytes: 10 %
Neutrophils Absolute: 3 10*3/uL (ref 1.4–7.0)
Neutrophils: 52 %
Platelets: 212 10*3/uL (ref 150–450)
RBC: 4.37 x10E6/uL (ref 3.77–5.28)
RDW: 13.7 % (ref 11.7–15.4)
WBC: 5.8 10*3/uL (ref 3.4–10.8)

## 2020-10-04 LAB — COMPREHENSIVE METABOLIC PANEL
ALT: 15 IU/L (ref 0–32)
AST: 23 IU/L (ref 0–40)
Albumin/Globulin Ratio: 2.3 — ABNORMAL HIGH (ref 1.2–2.2)
Albumin: 4.2 g/dL (ref 3.7–4.7)
Alkaline Phosphatase: 133 IU/L — ABNORMAL HIGH (ref 44–121)
BUN/Creatinine Ratio: 20 (ref 12–28)
BUN: 16 mg/dL (ref 8–27)
Bilirubin Total: <0.2 mg/dL (ref 0.0–1.2)
CO2: 26 mmol/L (ref 20–29)
Calcium: 9.1 mg/dL (ref 8.7–10.3)
Chloride: 102 mmol/L (ref 96–106)
Creatinine, Ser: 0.79 mg/dL (ref 0.57–1.00)
Globulin, Total: 1.8 g/dL (ref 1.5–4.5)
Glucose: 79 mg/dL (ref 65–99)
Potassium: 4.1 mmol/L (ref 3.5–5.2)
Sodium: 142 mmol/L (ref 134–144)
Total Protein: 6 g/dL (ref 6.0–8.5)
eGFR: 78 mL/min/{1.73_m2} (ref >59)

## 2020-10-04 LAB — LIPID PANEL
Chol/HDL Ratio: 3.7 ratio (ref 0.0–4.4)
Cholesterol, Total: 201 mg/dL — ABNORMAL HIGH (ref 100–199)
HDL: 54 mg/dL (ref >39)
LDL Chol Calc (NIH): 123 mg/dL — ABNORMAL HIGH (ref 0–99)
Triglycerides: 138 mg/dL (ref 0–149)
VLDL Cholesterol Cal: 24 mg/dL (ref 5–40)

## 2020-10-04 LAB — CARDIOVASCULAR RISK ASSESSMENT

## 2020-10-06 ENCOUNTER — Other Ambulatory Visit: Payer: Self-pay

## 2020-10-06 MED ORDER — EZETIMIBE 10 MG PO TABS: 10.0000 mg | ORAL_TABLET | Freq: Every day | ORAL | 2 refills | Status: DC

## 2020-10-12 ENCOUNTER — Other Ambulatory Visit: Payer: Self-pay | Admitting: Family Medicine

## 2020-10-13 ENCOUNTER — Ambulatory Visit (INDEPENDENT_AMBULATORY_CARE_PROVIDER_SITE_OTHER): Payer: Medicare Other | Admitting: Family Medicine

## 2020-10-13 ENCOUNTER — Other Ambulatory Visit: Payer: Self-pay

## 2020-10-13 ENCOUNTER — Encounter: Payer: Self-pay | Admitting: Family Medicine

## 2020-10-13 VITALS — BP 140/98 | HR 88 | Temp 97.3°F | Resp 14 | Ht 60.0 in | Wt 103.2 lb

## 2020-10-13 DIAGNOSIS — R251 Tremor, unspecified: Secondary | ICD-10-CM

## 2020-10-13 DIAGNOSIS — M545 Low back pain, unspecified: Secondary | ICD-10-CM

## 2020-10-13 DIAGNOSIS — F331 Major depressive disorder, recurrent, moderate: Secondary | ICD-10-CM

## 2020-10-13 DIAGNOSIS — L03114 Cellulitis of left upper limb: Secondary | ICD-10-CM

## 2020-10-13 DIAGNOSIS — R296 Repeated falls: Secondary | ICD-10-CM | POA: Diagnosis not present

## 2020-10-13 DIAGNOSIS — R413 Other amnesia: Secondary | ICD-10-CM | POA: Diagnosis not present

## 2020-10-13 DIAGNOSIS — S51852A Open bite of left forearm, initial encounter: Secondary | ICD-10-CM

## 2020-10-13 DIAGNOSIS — R270 Ataxia, unspecified: Secondary | ICD-10-CM

## 2020-10-13 DIAGNOSIS — W5501XA Bitten by cat, initial encounter: Secondary | ICD-10-CM

## 2020-10-13 DIAGNOSIS — G8929 Other chronic pain: Secondary | ICD-10-CM

## 2020-10-13 DIAGNOSIS — I10 Essential (primary) hypertension: Secondary | ICD-10-CM

## 2020-10-13 MED ORDER — ARIPIPRAZOLE 15 MG PO TABS: 15.0000 mg | ORAL_TABLET | Freq: Every day | ORAL | 0 refills | Status: DC

## 2020-10-13 MED ORDER — LEVOFLOXACIN 750 MG PO TABS: 750.0000 mg | ORAL_TABLET | Freq: Every day | ORAL | 0 refills | Status: DC

## 2020-10-13 MED ORDER — CLINDAMYCIN HCL 300 MG PO CAPS: 300.0000 mg | ORAL_CAPSULE | Freq: Three times a day (TID) | ORAL | 0 refills | Status: DC

## 2020-10-13 MED ORDER — PROPRANOLOL HCL 20 MG PO TABS: 20.0000 mg | ORAL_TABLET | Freq: Two times a day (BID) | ORAL | 0 refills | Status: DC

## 2020-10-13 NOTE — Progress Notes (Signed)
Subjective:  Patient ID: Anne [name], female    DOB: [birth year]  Age: 75 y.o. MRN: [id number]  Chief Complaint  Patient presents with   Memory Loss   Tremors   balance issues    HPI 75 year old white female who presents with numerous concerns.  These include memory loss, tremors, balance issues.  Patient has had balance issues going on for 1 to 2 years.  Most recently this spring she has had a full cardiac work-up while admitted to the hospital which was normal.  She has had a Holter monitor which was normal.  Patient had an MRI of her brain in February which was normal.  She is having lab work for both memory and balance issues.  B12, folate, MMA, TSH were all normal.  Patient is a little anemic but this has been stable.  Patient has also had in 2020 and EEG which was normal.  She does have some carotid stenosis however has seen specialists and they do not feel that this is the etiology of her dizziness.  Nor is her dizziness consistent with vertigo.   Patient also had a cat bite from a feral cat last week.  She was seen at the urgent care this past Friday where she received a tetanus vaccine, clindamycin, and Bactrim.  Her arm has improved however she is still having discomfort (pain.)  The cat was easily calmed and is in quarantine.  Depression has improved with abilify 10 mg once daily.  PHQ9 SCORE ONLY 10/13/2020 10/03/2020 10/03/2020  PHQ-9 Total Score '14 20 2     '$ Current Outpatient Medications on File Prior to Visit  Medication Sig Dispense Refill   albuterol (VENTOLIN HFA) 108 (90 Base) MCG/ACT inhaler INHALE 1 TO 2 PUFFS BY MOUTH EVERY 6 HOURS AS NEEDED FOR WHEEZING FOR SHORTNESS OF BREATH 18 g 12   atorvastatin (LIPITOR) 20 MG tablet Take 1 tablet (20 mg total) by mouth daily. 90 tablet 0   cetirizine (ZYRTEC) 10 MG tablet Take 1 tablet (10 mg total) by mouth daily. 30 tablet 11   Cholecalciferol (VITAMIN D3) 50000 UNITS CAPS Take 50,000 Units by mouth once a week.       ezetimibe (ZETIA) 10 MG tablet Take 1 tablet (10 mg total) by mouth daily. 30 tablet 2   fenofibrate 160 MG tablet Take 1 tablet (160 mg total) by mouth daily. 90 tablet 0   Fluticasone-Umeclidin-Vilant (TRELEGY ELLIPTA) 200-62.5-25 MCG/INH AEPB Inhale 1 puff into the lungs daily. 28 each 0   furosemide (LASIX) 20 MG tablet Take 1 tablet (20 mg total) by mouth daily as needed for edema (for leg swelling). 30 tablet 0   gabapentin (NEURONTIN) 300 MG capsule Take 2 capsules (600 mg total) by mouth 3 (three) times daily. 180 capsule 1   HYDROcodone-acetaminophen (NORCO/VICODIN) 5-325 MG tablet Take 1 tablet by mouth daily as needed for severe pain (migraines). 30 tablet 0   ipratropium-albuterol (DUONEB) 0.5-2.5 (3) MG/3ML SOLN Take 3 mLs by nebulization every 6 (six) hours as needed. 360 mL 12   meloxicam (MOBIC) 15 MG tablet TAKE 1 TABLET EVERY DAY 90 tablet 1   mirtazapine (REMERON) 45 MG tablet TAKE 1 TABLET (45 MG TOTAL) BY MOUTH AT BEDTIME. 90 tablet 1   nystatin (MYCOSTATIN) 100000 UNIT/ML suspension Take 5 mLs (500,000 Units total) by mouth 4 (four) times daily. Gargle, swish and spit (Patient not taking: Reported on 10/03/2020) 60 mL 0   omeprazole (PRILOSEC) 40 MG capsule Take 1 capsule (40  mg total) by mouth 2 (two) times daily. 180 capsule 1   orphenadrine (NORFLEX) 100 MG tablet Take 1 tablet by mouth twice daily 60 tablet 0   potassium chloride SA (KLOR-CON) 20 MEQ tablet TAKE 1 TABLET BY MOUTH ONCE DAILY WITH FOOD     promethazine (PHENERGAN) 25 MG tablet Take 1 tablet by mouth as needed for nausea/vomiting.     rizatriptan (MAXALT-MLT) 10 MG disintegrating tablet Take at onset of migraines, May repeat in 2 hours if not resolved 10 tablet 2   tamoxifen (NOLVADEX) 10 MG tablet Take 2 tablets by mouth once daily 90 tablet 3   temazepam (RESTORIL) 15 MG capsule TAKE 1 CAPSULE BY MOUTH ONCE DAILY AT BEDTIME AS NEEDED 45 capsule 0   traZODone (DESYREL) 100 MG tablet Take 1 tablet (100 mg total)  by mouth at bedtime as needed for sleep. 90 tablet 1   venlafaxine XR (EFFEXOR-XR) 75 MG 24 hr capsule Take 2 capsules (150 mg total) by mouth daily with breakfast. 1 capsule 0   Vitamin D, Ergocalciferol, (DRISDOL) 1.25 MG (50000 UNIT) CAPS capsule      Current Facility-Administered Medications on File Prior to Visit  Medication Dose Route Frequency Provider Last Rate Last Admin   methylPREDNISolone acetate (DEPO-MEDROL) injection 80 mg  80 mg Intramuscular Once Baird Lyons D, MD       Past Medical History:  Diagnosis Date   Asthma    daily inhalers   Breast cancer ([place]) 05/2015   left   COPD (chronic obstructive pulmonary disease) ([place])    denies SOB with ADLs; no O2   Dental crowns present    Depression    History of concussion 01/01/2014   History of kidney stones    History of TIA (transient ischemic attack) 10/2014   Hyperlipidemia    Insomnia    Migraine    Nasal congestion 05/26/2015   will finish z-pak 05/27/2015   Nonproductive cough 05/26/2015   PONV (postoperative nausea and vomiting)    "long time ago"  none recently   RLS (restless legs syndrome)    Tobacco abuse    Vitamin D deficiency    Wears dentures    lower   Wears partial dentures    upper   Past Surgical History:  Procedure Laterality Date   ABDOMINAL HYSTERECTOMY     APPENDECTOMY  2000   BREAST BIOPSY Left    BREAST RECONSTRUCTION WITH PLACEMENT OF TISSUE EXPANDER AND FLEX HD (ACELLULAR HYDRATED DERMIS) Bilateral 06/02/2015   Procedure: BILATERAL BREAST RECONSTRUCTION WITH PLACEMENT OF TISSUE EXPANDER AND  ACELLULAR HYDRATED DERMIS;  Surgeon: Irene Limbo, MD;  Location: [facility];  Service: Plastics;  Laterality: Bilateral;   CARPAL TUNNEL RELEASE Bilateral    CATARACT EXTRACTION Bilateral 09/2013   KNEE ARTHROSCOPY Left    LAPAROSCOPIC ABDOMINAL EXPLORATION N/A 10/2017   LAPAROSCOPIC LYSIS OF CONGENITAL ADHESIVE BAND/LADD'S BANDS WIH INTRAOPERATIVE CHOLANGIOGRAPHY. Perforation  of small intestion requiring repair.    LOOP RECORDER INSERTION N/A 05/11/2016   Procedure: Loop Recorder Insertion;  Surgeon: Will Meredith Leeds, MD;  Location: [facility];  Service: Cardiovascular;  Laterality: N/A;   MASTECTOMY W/ SENTINEL NODE BIOPSY Bilateral 06/02/2015   Procedure: BILATERAL MASTECTOMY WITH LEFT SENTINEL LYMPH NODE BIOPSY;  Surgeon: Stark Klein, MD;  Location: [facility];  Service: General;  Laterality: Bilateral;   NASAL SEPTUM SURGERY     x 3   PLANTAR FASCIA SURGERY Left    REMOVAL OF BILATERAL TISSUE  EXPANDERS WITH PLACEMENT OF BILATERAL BREAST IMPLANTS Bilateral 06/17/2015   Procedure: DEBRIDEMENT OF BILATERAL MASTECTOMY FLAP WITH BILATERAL TISSUE EXPANDER EXCHANGE ;  Surgeon: Irene Limbo, MD;  Location: [facility];  Service: Plastics;  Laterality: Bilateral;   REMOVAL OF TISSUE EXPANDER Bilateral 07/12/2015   Procedure: REMOVAL OF BILATERAL TISSUE EXPANDERS;  Surgeon: Irene Limbo, MD;  Location: [facility];  Service: Plastics;  Laterality: Bilateral;    Family History  Problem Relation Age of Onset   Cervical cancer Mother 63   Colon cancer Maternal Grandfather        mets to stomach; dx. 67-70   Heart disease Father    Prostate cancer Father 46   Cervical cancer Maternal Grandmother        dx. 29s; treated with radium implant   Lung cancer Sister 60       maternal half-sister dx. lung cancer, stage III   Breast cancer Maternal Aunt    Cervical cancer Sister 69       paternal half-sister; s/p TAH   Spina bifida Grandchild    Brain cancer Grandchild        grandson dx. at 20 mos, treated at [facility] Other 70       maternal half-sister's daughter   Cancer Maternal Aunt        d. 61; unspecified type - "began at back area and moved to vital organs"   Cancer Maternal Uncle         late 38s; unspecified type; "started at back and moved to vital organs"   Social History   Socioeconomic History   Marital  status: Widowed    Spouse name: Not on file   Number of children: 1   Years of education: 38   Highest education level: Not on file  Occupational History    Comment: [employer], retired  Tobacco Use   Smoking status: Former    Packs/day: 0.25    Types: Cigarettes    Quit date: 03/21/2020    Years since quitting: 0.5   Smokeless tobacco: Never  Vaping Use   Vaping Use: Never used  Substance and Sexual Activity   Alcohol use: Yes    Alcohol/week: 0.0 standard drinks    Comment: rarely   Drug use: No   Sexual activity: Not on file  Other Topics Concern   Not on file  Social History Narrative   Lives alone, widow   Caffeine use- tea, 1 cup daily   Social Determinants of Health   Financial Resource Strain: Not on file  Food Insecurity: Not on file  Transportation Needs: Not on file  Physical Activity: Not on file  Stress: Not on file  Social Connections: Not on file    Review of Systems  Constitutional:  Positive for fever. Negative for chills.  HENT:  Positive for congestion.   Respiratory:  Positive for cough and shortness of breath.   Cardiovascular:  Positive for chest pain.  Gastrointestinal:  Positive for abdominal pain.  Neurological:  Positive for tremors and light-headedness.       Balance issues   Psychiatric/Behavioral:  Positive for dysphoric mood. The patient is nervous/anxious.     Objective:  BP (!) 140/98   Pulse 88   Temp (!) 97.3 F (36.3 C)   Resp 14   Ht 5' (1.524 m)   Wt 103 lb 3.2 oz (46.8 kg)   BMI 20.15 kg/m   BP/Weight 10/13/2020 10/03/2020 0000000  Systolic  BP [id number] [id number] 123456  Diastolic BP 98 76 58  Wt. (Lbs) 103.2 100.2 101  BMI 20.15 19.57 19.73   No data found.   Physical Exam Vitals reviewed.  Constitutional:      General: She is not in acute distress.    Appearance: Normal appearance.     Comments: thin  HENT:     Right Ear: Tympanic membrane and ear canal normal.     Left Ear: Tympanic membrane and ear canal  normal.     Nose: Nose normal. No congestion or rhinorrhea.     Mouth/Throat:     Pharynx: No oropharyngeal exudate or posterior oropharyngeal erythema.  Eyes:     Extraocular Movements: Extraocular movements intact.  Neck:     Thyroid: No thyroid mass.     Vascular: No carotid bruit.  Cardiovascular:     Rate and Rhythm: Normal rate and regular rhythm.     Heart sounds: Normal heart sounds. No murmur heard. Pulmonary:     Effort: Pulmonary effort is normal.     Breath sounds: Normal breath sounds.  Abdominal:     General: Bowel sounds are normal.     Palpations: Abdomen is soft. There is no mass.     Tenderness: There is abdominal tenderness (mild upper epigastric).  Neurological:     General: No focal deficit present.     Mental Status: She is alert and oriented to person, place, and time.     Cranial Nerves: No cranial nerve deficit.     Coordination: Coordination abnormal (posoitve rhombergs. negative pronator drift).     Comments: Tremor (mild dysmetria.)  Psychiatric:        Mood and Affect: Mood normal.        Behavior: Behavior normal.    Diabetic Foot Exam - Simple   No data filed      Lab Results  Component Value Date   WBC 5.8 10/03/2020   HGB 11.8 10/03/2020   HCT 38.4 10/03/2020   PLT 212 10/03/2020   GLUCOSE 79 10/03/2020   CHOL 201 (H) 10/03/2020   TRIG 138 10/03/2020   HDL 54 10/03/2020   LDLCALC 123 (H) 10/03/2020   ALT 15 10/03/2020   AST 23 10/03/2020   NA 142 10/03/2020   K 4.1 10/03/2020   CL 102 10/03/2020   CREATININE 0.79 10/03/2020   BUN 16 10/03/2020   CO2 26 10/03/2020   TSH 1.320 03/01/2020      Assessment & Plan:   1. Memory loss DDX: Polypharmacy vs pseudodementia. Decrease gabapentin to 300 mg one three times a day.   2. Ataxia Decrease gabapentin to 300 mg one three times a day.   3. Frequent falls Decrease gabapentin to 300 mg one three times a day.  Caution.  4. Tremor Start inderal (propranolol) 20 mg one  twice a day.  Stop primidone.   5. Moderate recurrent major depression (HCC) Increase abilify to 15 mg once daily.  Continue effexor xr 150 mg once daily in am.   6. Chronic midline low back pain without sciatica  Stable. Again recommended pursuing a job without the requirement of lifting/manual labor.  7. Cat bite infection/cellulitis of left arm: Start levaquin. Stop bactrim. Complete 8 more days of clindamycin. New rx sent.   8. Hypertension Added inderal.   Meds ordered this encounter  Medications   levofloxacin (LEVAQUIN) 750 MG tablet    Sig: Take 1 tablet (750 mg total) by mouth daily.    Dispense:  7 tablet    Refill:  0   clindamycin (CLEOCIN) 300 MG capsule    Sig: Take 1 capsule (300 mg total) by mouth 3 (three) times daily.    Dispense:  21 capsule    Refill:  0   ARIPiprazole (ABILIFY) 15 MG tablet    Sig: Take 1 tablet (15 mg total) by mouth daily.    Dispense:  30 tablet    Refill:  0   propranolol (INDERAL) 20 MG tablet    Sig: Take 1 tablet (20 mg total) by mouth 2 (two) times daily.    Dispense:  60 tablet    Refill:  0   Follow-up: Return in about 3 weeks (around 11/03/2020) for depression.  An After Visit Summary was printed and given to the patient.  [name], MD [facility] [phone]

## 2020-10-13 NOTE — Patient Instructions (Addendum)
Balance issues/memory issues.  Decrease gabapentin to 300 mg one three times a day.   For tremor, hypertension, and it can help prevent migraines: Start inderal (propranolol) 20 mg one twice a day.  Stop primidone.   Depression:  Increase abilify to 15 mg once daily.  Continue effexor xr 150 mg once daily in am.   Cat bite infection: Start levaquin. Stop bactrim.  Complete 8 more days of clindamycin. New rx sent.

## 2020-10-18 DIAGNOSIS — F331 Major depressive disorder, recurrent, moderate: Secondary | ICD-10-CM | POA: Insufficient documentation

## 2020-10-18 DIAGNOSIS — W5501XA Bitten by cat, initial encounter: Secondary | ICD-10-CM | POA: Insufficient documentation

## 2020-10-18 DIAGNOSIS — R270 Ataxia, unspecified: Secondary | ICD-10-CM | POA: Insufficient documentation

## 2020-10-18 DIAGNOSIS — M545 Low back pain, unspecified: Secondary | ICD-10-CM | POA: Insufficient documentation

## 2020-10-18 DIAGNOSIS — S51852A Open bite of left forearm, initial encounter: Secondary | ICD-10-CM | POA: Insufficient documentation

## 2020-10-18 DIAGNOSIS — R296 Repeated falls: Secondary | ICD-10-CM

## 2020-10-18 DIAGNOSIS — F329 Major depressive disorder, single episode, unspecified: Secondary | ICD-10-CM | POA: Insufficient documentation

## 2020-10-18 DIAGNOSIS — R251 Tremor, unspecified: Secondary | ICD-10-CM

## 2020-10-18 DIAGNOSIS — L03114 Cellulitis of left upper limb: Secondary | ICD-10-CM | POA: Insufficient documentation

## 2020-10-18 DIAGNOSIS — R413 Other amnesia: Secondary | ICD-10-CM | POA: Insufficient documentation

## 2020-10-18 HISTORY — DX: Low back pain, unspecified: M54.50

## 2020-10-18 HISTORY — DX: Tremor, unspecified: R25.1

## 2020-10-18 HISTORY — DX: Major depressive disorder, single episode, unspecified: F32.9

## 2020-10-18 HISTORY — DX: Repeated falls: R29.6

## 2020-10-18 HISTORY — DX: Ataxia, unspecified: R27.0

## 2020-10-24 ENCOUNTER — Encounter: Payer: Self-pay | Admitting: Family Medicine

## 2020-11-14 ENCOUNTER — Ambulatory Visit: Payer: Medicare Other | Admitting: Family Medicine

## 2020-11-14 ENCOUNTER — Encounter: Payer: Self-pay | Admitting: Family Medicine

## 2020-11-17 ENCOUNTER — Other Ambulatory Visit: Payer: Self-pay | Admitting: Family Medicine

## 2020-11-22 ENCOUNTER — Telehealth: Payer: Self-pay

## 2020-11-22 NOTE — Telephone Encounter (Signed)
PA submitted for Orphenadrine via covermymeds. Per covermymeds no PA is required for medication, it is available w/o PA being required.

## 2020-12-15 ENCOUNTER — Other Ambulatory Visit: Payer: Self-pay | Admitting: Family Medicine

## 2020-12-15 NOTE — Telephone Encounter (Signed)
Refill sent to pharmacy.   

## 2020-12-19 ENCOUNTER — Other Ambulatory Visit: Payer: Self-pay

## 2020-12-19 DIAGNOSIS — M5416 Radiculopathy, lumbar region: Secondary | ICD-10-CM

## 2020-12-19 MED ORDER — HYDROCODONE-ACETAMINOPHEN 5-325 MG PO TABS: 1.0000 | ORAL_TABLET | Freq: Every day | ORAL | 0 refills | Status: DC | PRN

## 2020-12-20 ENCOUNTER — Encounter: Payer: Self-pay | Admitting: Family Medicine

## 2020-12-20 ENCOUNTER — Telehealth: Payer: Self-pay

## 2020-12-20 NOTE — Telephone Encounter (Signed)
Anne [name] is complaining of nausea and back pain.  She was requesting a refill on her phenergan and muscle relaxant.  Dr. Tobie Poet advised that she schedule a visit.

## 2020-12-21 ENCOUNTER — Other Ambulatory Visit: Payer: Self-pay

## 2020-12-21 ENCOUNTER — Ambulatory Visit (INDEPENDENT_AMBULATORY_CARE_PROVIDER_SITE_OTHER): Payer: Medicare Other | Admitting: Family Medicine

## 2020-12-21 VITALS — BP 130/68 | HR 76 | Temp 97.0°F | Resp 16 | Wt 97.0 lb

## 2020-12-21 DIAGNOSIS — J441 Chronic obstructive pulmonary disease with (acute) exacerbation: Secondary | ICD-10-CM

## 2020-12-21 DIAGNOSIS — M25511 Pain in right shoulder: Secondary | ICD-10-CM | POA: Diagnosis not present

## 2020-12-21 DIAGNOSIS — M545 Low back pain, unspecified: Secondary | ICD-10-CM

## 2020-12-21 DIAGNOSIS — G43709 Chronic migraine without aura, not intractable, without status migrainosus: Secondary | ICD-10-CM

## 2020-12-21 DIAGNOSIS — L720 Epidermal cyst: Secondary | ICD-10-CM

## 2020-12-21 DIAGNOSIS — G8929 Other chronic pain: Secondary | ICD-10-CM

## 2020-12-21 MED ORDER — LEVOFLOXACIN 750 MG PO TABS: 750.0000 mg | ORAL_TABLET | Freq: Every day | ORAL | 0 refills | Status: DC

## 2020-12-21 MED ORDER — CEFTRIAXONE SODIUM 1 G IJ SOLR
1.0000 g | Freq: Once | INTRAMUSCULAR | Status: AC
  Administered 2020-12-21: 1 g via INTRAMUSCULAR

## 2020-12-21 MED ORDER — PREDNISONE 20 MG PO TABS: ORAL_TABLET | ORAL | 0 refills | Status: AC

## 2020-12-21 MED ORDER — SUMATRIPTAN SUCCINATE 100 MG PO TABS: 100.0000 mg | ORAL_TABLET | ORAL | 3 refills | Status: DC | PRN

## 2020-12-21 MED ORDER — TRIAMCINOLONE ACETONIDE 40 MG/ML IJ SUSP
80.0000 mg | Freq: Once | INTRAMUSCULAR | Status: AC
  Administered 2020-12-21: 80 mg via INTRAMUSCULAR

## 2020-12-21 NOTE — Patient Instructions (Signed)
COPD exacerbation: Rx prednisone and levaquin.  Continue trelegy and albuterol.  Back pain Rx. Prednisone. Hold nsaids (meloxicam/iburpfoen) May take tylenol.

## 2020-12-21 NOTE — Progress Notes (Addendum)
Acute Office Visit  Subjective:    Patient ID: Anne [name], female    DOB: 09-19-1945, 75 y.o.   MRN: [id number]  Chief Complaint  Patient presents with   Back Pain   Nausea    HPI: Patient is in today for chronic lumbar back pain and right shoulder pain. No radiculopathy. Complaining of rt shoulder pain: chronic. Taking ibuprofen 600 mg one three times a day. Taking gabapentin 300 mg 2 pills three times a day.  COPD: wheezing/coughing- green sputum. No fevers. No chills. No sore throat. Using trelegy one inhalation. Using albuterol HFA 2 puffs every 6 hrs as needed and has been using it numerous times per day.    Past Medical History:  Diagnosis Date   Abnormal MRI of the head 02/08/2014   Formatting of this note might be different from the original. History of abn MRI brain in 2015 due to concussion. Formatting of this note might be different from the original. Overview:  History of abn MRI brain in 2015 due to concussion.   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 Niece at 28; dx. Maternal aunt in her late 56s  Formatting of this note might be different from the original. Overview:  Dx. Niece at 79; dx. Maternal aunt in her late 58s   Family history of colon cancer 04/26/2015   Dx. In maternal grandfather in his late 12s-70  Formatting of this note might be different from the original. Overview:  Dx. In maternal grandfather in his late 44s-70   Genetic testing 06/02/2015   Negative for pathogenic mutations within any of 20 genes on the Breast/Ovarian Cancer Panel through [employer].  No variants of uncertain significance (VUSes) were  found.  The Breast/Ovarian Cancer Panel offered by [facility] Hope Pigeon, MD) includes sequencing and deletion/duplication analysis for the following 19 genes:  ATM, BARD1, BRCA1, BRCA2, BRIP1, CDH1, CHEK2, FANCC, M   History of basal cell carcinoma 04/26/2015   Dx. 44  Formatting of this note might be different from the original. Overview:  Dx. 67   History of concussion 01/01/2014   History of kidney stones    History of TIA (transient ischemic attack) 10/2014   Hyperlipidemia    Insomnia    Malrotation of intestine 09/23/2017   Migraine    Nasal congestion 05/26/2015   will finish z-pak 05/27/2015   Nonproductive cough 05/26/2015   PONV (postoperative nausea and vomiting)    "long time ago"  none recently   Psychogenic syncope 04/23/2019   RLS (restless legs syndrome)    Tobacco abuse    Vitamin D deficiency    Wears dentures    lower   Wears partial dentures    upper   Weight loss 08/28/2019    Past Surgical History:  Procedure Laterality Date   ABDOMINAL HYSTERECTOMY     APPENDECTOMY  2000   BREAST BIOPSY Left    BREAST RECONSTRUCTION WITH PLACEMENT OF TISSUE EXPANDER AND FLEX HD (ACELLULAR HYDRATED DERMIS) Bilateral 06/02/2015   Procedure: BILATERAL BREAST RECONSTRUCTION WITH PLACEMENT OF TISSUE EXPANDER AND  ACELLULAR  HYDRATED DERMIS;  Surgeon: Irene Limbo, MD;  Location: [facility];  Service: Plastics;  Laterality: Bilateral;   CARPAL TUNNEL RELEASE Bilateral    CATARACT EXTRACTION Bilateral 09/2013   KNEE ARTHROSCOPY Left    LAPAROSCOPIC ABDOMINAL EXPLORATION N/A 10/2017   LAPAROSCOPIC LYSIS OF CONGENITAL ADHESIVE BAND/LADD'S BANDS WIH INTRAOPERATIVE CHOLANGIOGRAPHY. Perforation of small intestion requiring repair.    LOOP RECORDER INSERTION N/A 05/11/2016   Procedure: Loop Recorder Insertion;  Surgeon: Will Meredith Leeds, MD;  Location: [facility];  Service: Cardiovascular;  Laterality: N/A;   MASTECTOMY W/ SENTINEL NODE BIOPSY Bilateral  06/02/2015   Procedure: BILATERAL MASTECTOMY WITH LEFT SENTINEL LYMPH NODE BIOPSY;  Surgeon: Stark Klein, MD;  Location: [facility];  Service: General;  Laterality: Bilateral;   NASAL SEPTUM SURGERY     x 3   PLANTAR FASCIA SURGERY Left    REMOVAL OF BILATERAL TISSUE EXPANDERS WITH PLACEMENT OF BILATERAL BREAST IMPLANTS Bilateral 06/17/2015   Procedure: DEBRIDEMENT OF BILATERAL MASTECTOMY FLAP WITH BILATERAL TISSUE EXPANDER EXCHANGE ;  Surgeon: Irene Limbo, MD;  Location: [facility];  Service: Plastics;  Laterality: Bilateral;   REMOVAL OF TISSUE EXPANDER Bilateral 07/12/2015   Procedure: REMOVAL OF BILATERAL TISSUE EXPANDERS;  Surgeon: Irene Limbo, MD;  Location: [facility];  Service: Plastics;  Laterality: Bilateral;    Family History  Problem Relation Age of Onset   Cervical cancer Mother 26   Colon cancer Maternal Grandfather        mets to stomach; dx. 67-70   Heart disease Father    Prostate cancer Father 32   Cervical cancer Maternal Grandmother        dx. 19s; treated with radium implant   Lung cancer Sister 44       maternal half-sister dx. lung cancer, stage III   Breast cancer Maternal Aunt    Cervical cancer Sister 80       paternal half-sister; s/p TAH   Spina bifida Grandchild    Brain cancer Grandchild        grandson dx. at 20 mos, treated at [facility] cancer Other 71       maternal half-sister's daughter   Cancer Maternal Aunt        d. 65; unspecified type - "began at back area and moved to vital organs"   Cancer Maternal Uncle         late 44s; unspecified type; "started at back and moved to vital organs"    Social History   Socioeconomic History   Marital status: Widowed    Spouse name: Not on file   Number of children: 1   Years of education: 31   Highest education level: Not on file  Occupational History    Comment: [employer], retired  Tobacco Use   Smoking status: Former    Packs/day: 0.25    Types:  Cigarettes    Quit date: 03/21/2020    Years since quitting: 1.0   Smokeless tobacco: Never  Vaping Use   Vaping Use: Never used  Substance and Sexual Activity   Alcohol use: Yes    Alcohol/week: 0.0 standard drinks    Comment: rarely   Drug use: No   Sexual activity: Not on file  Other Topics Concern   Not on file  Social History Narrative   Lives alone, widow   Caffeine use- tea, 1 cup daily   Right Handed    Lives in [place] one story [place]  home    Social Determinants of Health   Financial Resource Strain: Low Risk    Difficulty of Paying Living Expenses: Not hard at all  Food Insecurity: Not on file  Transportation Needs: No Transportation Needs   Lack of Transportation (Medical): No   Lack of Transportation (Non-Medical): No  Physical Activity: Not on file  Stress: Not on file  Social Connections: Not on file  Intimate Partner Violence: Not on file    Outpatient Medications Prior to Visit  Medication Sig Dispense Refill   cetirizine (ZYRTEC) 10 MG tablet Take 1 tablet (10 mg total) by mouth daily. 30 tablet 11   gabapentin (NEURONTIN) 300 MG capsule Take 2 capsules (600 mg total) by mouth 3 (three) times daily. 180 capsule 1   meloxicam (MOBIC) 15 MG tablet TAKE 1 TABLET EVERY DAY 90 tablet 1   mirtazapine (REMERON) 45 MG tablet TAKE 1 TABLET (45 MG TOTAL) BY MOUTH AT BEDTIME. 90 tablet 1   omeprazole (PRILOSEC) 40 MG capsule TAKE 1 CAPSULE TWICE DAILY 180 capsule 1   orphenadrine (NORFLEX) 100 MG tablet TAKE 1 TABLET (100 MG TOTAL) BY MOUTH 2 (TWO) TIMES DAILY AS NEEDED FOR MUSCLE SPASMS. 180 tablet 0   primidone (MYSOLINE) 50 MG tablet TAKE 1 TABLET BY MOUTH IN THE MORNING AND TAKE 1 TABLET AT BEDTIME 180 tablet 0   albuterol (VENTOLIN HFA) 108 (90 Base) MCG/ACT inhaler INHALE 1 TO 2 PUFFS BY MOUTH EVERY 6 HOURS AS NEEDED FOR WHEEZING FOR SHORTNESS OF BREATH 18 g 12   ARIPiprazole (ABILIFY) 15 MG tablet Take 1 tablet (15 mg total) by mouth daily. 30 tablet 0    atorvastatin (LIPITOR) 20 MG tablet Take 1 tablet (20 mg total) by mouth daily. (Patient not taking: Reported on 03/08/2021) 90 tablet 0   Cholecalciferol (VITAMIN D3) 50000 UNITS CAPS Take 50,000 Units by mouth once a week.  (Patient not taking: Reported on 03/08/2021)     clindamycin (CLEOCIN) 300 MG capsule Take 1 capsule (300 mg total) by mouth 3 (three) times daily. 21 capsule 0   ezetimibe (ZETIA) 10 MG tablet Take 1 tablet (10 mg total) by mouth daily. (Patient not taking: Reported on 03/08/2021) 30 tablet 2   fenofibrate 160 MG tablet Take 1 tablet (160 mg total) by mouth daily. (Patient not taking: Reported on 03/08/2021) 90 tablet 0   Fluticasone-Umeclidin-Vilant (TRELEGY ELLIPTA) 200-62.5-25 MCG/INH AEPB Inhale 1 puff into the lungs daily. (Patient not taking: Reported on 03/08/2021) 28 each 0   furosemide (LASIX) 20 MG tablet Take 1 tablet (20 mg total) by mouth daily as needed for edema (for leg swelling). 30 tablet 0   HYDROcodone-acetaminophen (NORCO/VICODIN) 5-325 MG tablet Take 1 tablet by mouth daily as needed for severe pain (migraines). 30 tablet 0   ipratropium-albuterol (DUONEB) 0.5-2.5 (3) MG/3ML SOLN Take 3 mLs by nebulization every 6 (six) hours as needed. 360 mL 12   levofloxacin (LEVAQUIN) 750 MG tablet Take 1 tablet (750 mg total) by mouth daily. 7 tablet 0   nystatin (MYCOSTATIN) 100000 UNIT/ML suspension Take 5 mLs (500,000 Units total) by mouth 4 (four) times daily. Gargle, swish and spit (Patient not taking: Reported on 10/03/2020) 60 mL 0   potassium chloride SA (KLOR-CON) 20 MEQ tablet TAKE 1 TABLET BY MOUTH ONCE DAILY WITH FOOD (Patient not taking: Reported on 03/08/2021)     promethazine (PHENERGAN) 25 MG tablet Take 1 tablet by mouth as needed for nausea/vomiting. (Patient not taking: Reported on 03/08/2021)  propranolol (INDERAL) 20 MG tablet Take 1 tablet (20 mg total) by mouth 2 (two) times daily. 60 tablet 0   rizatriptan (MAXALT-MLT) 10 MG disintegrating tablet Take at  onset of migraines, May repeat in 2 hours if not resolved 10 tablet 2   tamoxifen (NOLVADEX) 10 MG tablet Take 2 tablets by mouth once daily 90 tablet 3   temazepam (RESTORIL) 15 MG capsule TAKE 1 CAPSULE BY MOUTH ONCE DAILY AT BEDTIME AS NEEDED (Patient not taking: Reported on 02/17/2021) 45 capsule 0   traZODone (DESYREL) 100 MG tablet Take 1 tablet (100 mg total) by mouth at bedtime as needed for sleep. 90 tablet 1   venlafaxine XR (EFFEXOR-XR) 75 MG 24 hr capsule Take 2 capsules (150 mg total) by mouth daily with breakfast. (Patient not taking: Reported on 03/08/2021) 1 capsule 0   Vitamin D, Ergocalciferol, (DRISDOL) 1.25 MG (50000 UNIT) CAPS capsule  (Patient not taking: Reported on 03/08/2021)     methylPREDNISolone acetate (DEPO-MEDROL) injection 80 mg      No facility-administered medications prior to visit.    Allergies  Allergen Reactions   Rocephin [Ceftriaxone Sodium In Dextrose] Shortness Of Breath and Rash   Clindamycin/Lincomycin Nausea And Vomiting   Lyrica [Pregabalin] Other (See Comments)    PERSONALITY CHANGE    Review of Systems  Constitutional:  Positive for fatigue and unexpected weight change. Negative for chills and fever.  HENT:  Positive for congestion and rhinorrhea. Negative for ear pain and sore throat.   Respiratory:  Positive for cough and shortness of breath.   Cardiovascular:  Negative for chest pain.  Gastrointestinal:  Negative for abdominal pain, constipation, diarrhea, nausea and vomiting.  Genitourinary:  Positive for dysuria. Negative for urgency.  Musculoskeletal:  Positive for arthralgias, back pain and myalgias.  Skin:  Negative for rash.       Bump on forehead.   Neurological:  Positive for headaches. Negative for dizziness.  Psychiatric/Behavioral:  Positive for dysphoric mood. The patient is not nervous/anxious.      PHQ9 SCORE ONLY 12/25/2020 10/13/2020 10/03/2020  PHQ-9 Total Score _0 Objective:    Physical Exam Vitals reviewed.   Constitutional:      Appearance: Normal appearance.  Cardiovascular:     Rate and Rhythm: Normal rate and regular rhythm.     Heart sounds: Normal heart sounds.  Pulmonary:     Effort: Pulmonary effort is normal. No respiratory distress.     Breath sounds: Wheezing present.  Abdominal:     General: Abdomen is flat. Bowel sounds are normal.     Palpations: Abdomen is soft.     Tenderness: There is no abdominal tenderness.  Musculoskeletal:        General: Tenderness (lumbar back. rt shoulder anteriorly. decreased abduction, external and internal rotation. Left shoulder has normal exam.) present.  Skin:    Findings: Lesion (EIC forehead at scalp line.) present.  Neurological:     Mental Status: She is alert and oriented to person, place, and time.  Psychiatric:        Mood and Affect: Mood normal.        Behavior: Behavior normal.    BP 130/68   Pulse 76   Temp (!) 97 F (36.1 C)   Resp 16   Wt 97 lb (44 kg)   BMI 18.94 kg/m  Wt Readings from Last 3 Encounters:  03/08/21 96 lb (43.5 kg)  02/14/21 98 lb 3.2 oz (44.5 kg)  01/30/21 96  lb 9.6 oz (43.8 kg)    Health Maintenance Due  Topic Date Due   Hepatitis C Screening  Never done   Zoster Vaccines- Shingrix (1 of 2) Never done   COVID-19 Vaccine (3 - Moderna risk series) 03/29/2020   INFLUENZA VACCINE  10/03/2020    There are no preventive care reminders to display for this patient.   Lab Results  Component Value Date   TSH 1.100 01/30/2021   Lab Results  Component Value Date   WBC 7.9 02/14/2021   HGB 11.1 02/14/2021   HCT 33.8 (L) 02/14/2021   MCV 89 02/14/2021   PLT 230 02/14/2021   Lab Results  Component Value Date   NA 141 02/14/2021   K 4.1 02/14/2021   CO2 27 02/14/2021   GLUCOSE 82 02/14/2021   BUN 15 02/14/2021   CREATININE 0.74 02/14/2021   BILITOT <0.2 02/14/2021   ALKPHOS 126 (H) 02/14/2021   AST 25 02/14/2021   ALT 26 02/14/2021   PROT 5.5 (L) 02/14/2021   ALBUMIN 3.8 02/14/2021    CALCIUM 8.6 (L) 02/14/2021   ANIONGAP 3 (L) 07/03/2016   EGFR 85 02/14/2021   Lab Results  Component Value Date   CHOL 132 01/30/2021   Lab Results  Component Value Date   HDL 64 01/30/2021   Lab Results  Component Value Date   LDLCALC 45 01/30/2021   Lab Results  Component Value Date   TRIG 133 01/30/2021   Lab Results  Component Value Date   CHOLHDL 2.1 01/30/2021   No results found for: HGBA1C     Assessment & Plan:   Problem List Items Addressed This Visit       Cardiovascular and Mediastinum   Chronic migraine without aura without status migrainosus, not intractable    Changed relpax to sumatriptan per pt request.         Respiratory   COPD mixed type ([place])    Levaquin rx.  Prednisone rx. Rocephin 1 gm and Kenalog 80 mg injections given.         Other   EIC (epidermal inclusion cyst)    On forehead.  I reassured pt, but she would like to have excised.       Lumbar back pain - Primary    Prednisone rx.  Gabapentin.  No ibuprofen while on prednisone.       Chronic pain in right shoulder    Hopefully, prednisone will help.  Hold all nsaids while taking prednisone.       Meds ordered this encounter  Medications   triamcinolone acetonide (KENALOG-40) injection 80 mg   cefTRIAXone (ROCEPHIN) injection 1 g   predniSONE (DELTASONE) 20 MG tablet    Sig: Take 3 tablets (60 mg total) by mouth daily with breakfast for 3 days, THEN 2 tablets (40 mg total) daily with breakfast for 3 days, THEN 1 tablet (20 mg total) daily with breakfast for 3 days.    Dispense:  18 tablet    Refill:  0   DISCONTD: levofloxacin (LEVAQUIN) 750 MG tablet    Sig: Take 1 tablet (750 mg total) by mouth daily.    Dispense:  7 tablet    Refill:  0   DISCONTD: SUMAtriptan (IMITREX) 100 MG tablet    Sig: Take 1 tablet (100 mg total) by mouth every 2 (two) hours as needed for migraine. May repeat in 2 hours if headache persists or recurs.    Dispense:  10 tablet    Refill:  3    No orders of the defined types were placed in this encounter.     Follow-up: Return in about 6 weeks (around 01/30/2021) for chronic fasting.  An After Visit Summary was printed and given to the patient.  [name], MD [facility] [phone]

## 2020-12-22 ENCOUNTER — Other Ambulatory Visit: Payer: Self-pay | Admitting: Family Medicine

## 2020-12-22 MED ORDER — SUMATRIPTAN SUCCINATE 100 MG PO TABS: ORAL_TABLET | ORAL | 3 refills | Status: DC

## 2020-12-25 ENCOUNTER — Encounter: Payer: Self-pay | Admitting: Family Medicine

## 2020-12-25 DIAGNOSIS — G8929 Other chronic pain: Secondary | ICD-10-CM

## 2020-12-25 DIAGNOSIS — G43719 Chronic migraine without aura, intractable, without status migrainosus: Secondary | ICD-10-CM | POA: Insufficient documentation

## 2020-12-25 DIAGNOSIS — M79601 Pain in right arm: Secondary | ICD-10-CM | POA: Insufficient documentation

## 2020-12-25 DIAGNOSIS — G43709 Chronic migraine without aura, not intractable, without status migrainosus: Secondary | ICD-10-CM | POA: Insufficient documentation

## 2020-12-25 HISTORY — DX: Other chronic pain: G89.29

## 2020-12-25 NOTE — Assessment & Plan Note (Signed)
Levaquin rx.  Prednisone rx. Rocephin 1 gm and Kenalog 80 mg injections given.

## 2020-12-25 NOTE — Assessment & Plan Note (Signed)
Hopefully, prednisone will help.  Hold all nsaids while taking prednisone.

## 2020-12-25 NOTE — Assessment & Plan Note (Signed)
Prednisone rx.  Gabapentin.  No ibuprofen while on prednisone.

## 2020-12-25 NOTE — Assessment & Plan Note (Signed)
Changed relpax to sumatriptan per pt request.

## 2020-12-29 ENCOUNTER — Telehealth: Payer: Self-pay

## 2020-12-29 NOTE — Telephone Encounter (Signed)
Called pt. Pt would like to see Dr Glynda Jaeger.   Royce Macadamia, [place] 12/29/20 4:54 PM

## 2020-12-29 NOTE — Telephone Encounter (Signed)
Patient called stating she has a cyst in the middle of her forehead, that Dr. Tobie Poet felt and looked at it at the last office visit, patient wants a referral or wants to know next steps to having it removed.

## 2021-01-02 ENCOUNTER — Other Ambulatory Visit: Payer: Self-pay | Admitting: Family Medicine

## 2021-01-02 DIAGNOSIS — D2339 Other benign neoplasm of skin of other parts of face: Secondary | ICD-10-CM

## 2021-01-02 NOTE — Telephone Encounter (Signed)
done

## 2021-01-09 ENCOUNTER — Other Ambulatory Visit: Payer: Self-pay | Admitting: Family Medicine

## 2021-01-16 ENCOUNTER — Other Ambulatory Visit: Payer: Self-pay | Admitting: Family Medicine

## 2021-01-25 NOTE — Progress Notes (Signed)
Subjective:  Patient ID: Anne [name], female    DOB: 06/26/1945  Age: 75 y.o. MRN: [id number]  Chief Complaint  Patient presents with   Hyperlipidemia   COPD   HPI: Tremor: Current medications: propranolol 20 mg twice daily and primidone 50 mg bid  Migraines: on propranolol 20 mg bid and sumatriptan 100 mg for actue treatment of migraines. Pt awoke with a migraine this morning. She has taken a sumatriptan and a hydrocodone without complete relief.  Hyperlipidemia: Current Medications: atorvastatin 20 mg daily, fenofibrate 160 mg daily, and zetia 10 mg once daily   Chronic Migraine: Current Medications: HA since this morning. NO nausea, vomiting. Has photophobia. Has taken a hydrocodone and sumatriptan (9:30 am) Starts a new job at [employer] in [place].    Moderate recurrent major depression: Current Medications: abilify 15 mg once daily, effexor xr 75 mg 2 daily in AM.  Patient takes trazodone 100 mg once at bedtime as needed for insomnia as well as temazepam 15 mg once daily at night for insomnia.  This was also to hopefully help with her appetite which is very poor.   COPD: Current Medications: Trelegy 1 inhalation daily and has albuterol inhaler 2 puffs 4 times a day as needed.  Patient has decreased her smoking.  She denies coughing or shortness of breath greater than her baseline.  Chronic back pain: Currently on Norflex 100 Twice daily as needed, Meloxicam 15 mg once daily, gabapentin 300 mg to 3 times a day, and hydrocodone acetaminophen once daily as needed for severe back pain.  Memory.  She says that yesterday she accidentally came to the office thinking it was Monday.  Brain MRI done in February 2022 was normal.   Current Outpatient Medications on File Prior to Visit  Medication Sig Dispense Refill   albuterol (VENTOLIN HFA) 108 (90 Base) MCG/ACT inhaler INHALE 1 TO 2 PUFFS BY MOUTH EVERY 6 HOURS AS NEEDED FOR WHEEZING FOR SHORTNESS OF BREATH 18 g 12    ARIPiprazole (ABILIFY) 15 MG tablet Take 1 tablet (15 mg total) by mouth daily. 30 tablet 0   atorvastatin (LIPITOR) 20 MG tablet Take 1 tablet (20 mg total) by mouth daily. 90 tablet 0   cetirizine (ZYRTEC) 10 MG tablet Take 1 tablet (10 mg total) by mouth daily. 30 tablet 11   Cholecalciferol (VITAMIN D3) 50000 UNITS CAPS Take 50,000 Units by mouth once a week.      ezetimibe (ZETIA) 10 MG tablet Take 1 tablet (10 mg total) by mouth daily. 30 tablet 2   fenofibrate 160 MG tablet Take 1 tablet (160 mg total) by mouth daily. 90 tablet 0   Fluticasone-Umeclidin-Vilant (TRELEGY ELLIPTA) 200-62.5-25 MCG/INH AEPB Inhale 1 puff into the lungs daily. 28 each 0   furosemide (LASIX) 20 MG tablet Take 1 tablet (20 mg total) by mouth daily as needed for edema (for leg swelling). 30 tablet 0   gabapentin (NEURONTIN) 300 MG capsule Take 2 capsules (600 mg total) by mouth 3 (three) times daily. 180 capsule 1   HYDROcodone-acetaminophen (NORCO/VICODIN) 5-325 MG tablet Take 1 tablet by mouth daily as needed for severe pain (migraines). 30 tablet 0   ipratropium-albuterol (DUONEB) 0.5-2.5 (3) MG/3ML SOLN Take 3 mLs by nebulization every 6 (six) hours as needed. 360 mL 12   meloxicam (MOBIC) 15 MG tablet TAKE 1 TABLET EVERY DAY 90 tablet 1   mirtazapine (REMERON) 45 MG tablet TAKE 1 TABLET (45 MG TOTAL) BY MOUTH AT BEDTIME. 90 tablet 1  omeprazole (PRILOSEC) 40 MG capsule TAKE 1 CAPSULE TWICE DAILY 180 capsule 1   orphenadrine (NORFLEX) 100 MG tablet TAKE 1 TABLET (100 MG TOTAL) BY MOUTH 2 (TWO) TIMES DAILY AS NEEDED FOR MUSCLE SPASMS. 180 tablet 0   potassium chloride SA (KLOR-CON) 20 MEQ tablet TAKE 1 TABLET BY MOUTH ONCE DAILY WITH FOOD     primidone (MYSOLINE) 50 MG tablet TAKE 1 TABLET BY MOUTH IN THE MORNING AND TAKE 1 TABLET AT BEDTIME 180 tablet 0   promethazine (PHENERGAN) 25 MG tablet Take 1 tablet by mouth as needed for nausea/vomiting.     propranolol (INDERAL) 20 MG tablet Take 1 tablet by mouth twice  daily 180 tablet 1   SUMAtriptan (IMITREX) 100 MG tablet May repeat once in 2 hours, if headache persists or recurs. No more than 2 in 24 hours. 10 tablet 3   temazepam (RESTORIL) 15 MG capsule TAKE 1 CAPSULE BY MOUTH ONCE DAILY AT BEDTIME AS NEEDED 45 capsule 0   traZODone (DESYREL) 100 MG tablet Take 1 tablet (100 mg total) by mouth at bedtime as needed for sleep. 90 tablet 1   venlafaxine XR (EFFEXOR-XR) 75 MG 24 hr capsule Take 2 capsules (150 mg total) by mouth daily with breakfast. 1 capsule 0   Vitamin D, Ergocalciferol, (DRISDOL) 1.25 MG (50000 UNIT) CAPS capsule  (Patient not taking: Reported on 01/30/2021)     No current facility-administered medications on file prior to visit.   Past Medical History:  Diagnosis Date   Abnormal MRI of the head 02/08/2014   Formatting of this note might be different from the original. History of abn MRI brain in 2015 due to concussion. Formatting of this note might be different from the original. Overview:  History of abn MRI brain in 2015 due to concussion.   Acquired absence of both breasts and nipples 06/08/2015   Asthma    daily inhalers   Breast cancer ([place]) 05/2015   left   Breast cancer of upper-inner quadrant of left female breast ([place]) 04/15/2015   COPD (chronic obstructive pulmonary disease) ([address])    denies SOB with ADLs; no O2   Dental crowns present    Depression    Family history of breast cancer in female 04/26/2015   Dx. Niece at 85; dx. Maternal aunt in her late 71s  Formatting of this note might be different from the original. Overview:  Dx. Niece at 76; dx. Maternal aunt in her late 66s   Family history of colon cancer 04/26/2015   Dx. In maternal grandfather in his late 16s-70  Formatting of this note might be different from the original. Overview:  Dx. In maternal grandfather in his late 18s-70   Genetic testing 06/02/2015   Negative for pathogenic mutations within any of 20 genes on the Breast/Ovarian Cancer Panel through [facility].  No variants of uncertain significance (VUSes) were found.  The Breast/Ovarian Cancer Panel offered by [facility] Hope Pigeon, MD) includes sequencing and deletion/duplication analysis for the following 19 genes:  ATM, BARD1, BRCA1, BRCA2, BRIP1, CDH1, CHEK2, FANCC, M   History of basal cell carcinoma 04/26/2015   Dx. 26  Formatting of this note might be different from the original. Overview:  Dx. 67   History of concussion 01/01/2014   History of kidney stones    History of TIA (transient ischemic attack) 10/2014   Hyperlipidemia    Insomnia    Malrotation of intestine 09/23/2017   Migraine    Nasal congestion 05/26/2015  will finish z-pak 05/27/2015   Nonproductive cough 05/26/2015   PONV (postoperative nausea and vomiting)    "long time ago"  none recently   Psychogenic syncope 04/23/2019   RLS (restless legs syndrome)    Tobacco abuse    Vitamin D deficiency    Wears dentures    lower   Wears partial dentures    upper   Weight loss 08/28/2019   Past Surgical History:  Procedure Laterality Date   ABDOMINAL HYSTERECTOMY     APPENDECTOMY  2000   BREAST BIOPSY Left    BREAST RECONSTRUCTION WITH PLACEMENT OF TISSUE EXPANDER AND FLEX HD (ACELLULAR HYDRATED DERMIS) Bilateral 06/02/2015   Procedure: BILATERAL BREAST RECONSTRUCTION WITH PLACEMENT OF TISSUE EXPANDER AND  ACELLULAR HYDRATED DERMIS;  Surgeon: Irene Limbo, MD;  Location: [facility];  Service: Plastics;  Laterality: Bilateral;   CARPAL TUNNEL RELEASE Bilateral    CATARACT EXTRACTION Bilateral 09/2013   KNEE ARTHROSCOPY Left    LAPAROSCOPIC ABDOMINAL EXPLORATION N/A 10/2017   LAPAROSCOPIC LYSIS OF CONGENITAL ADHESIVE BAND/LADD'S BANDS WIH INTRAOPERATIVE CHOLANGIOGRAPHY. Perforation of small intestion requiring repair.    LOOP RECORDER INSERTION N/A 05/11/2016   Procedure: Loop Recorder Insertion;  Surgeon: Will Meredith Leeds, MD;  Location: [facility];  Service: Cardiovascular;   Laterality: N/A;   MASTECTOMY W/ SENTINEL NODE BIOPSY Bilateral 06/02/2015   Procedure: BILATERAL MASTECTOMY WITH LEFT SENTINEL LYMPH NODE BIOPSY;  Surgeon: Stark Klein, MD;  Location: [facility];  Service: General;  Laterality: Bilateral;   NASAL SEPTUM SURGERY     x 3   PLANTAR FASCIA SURGERY Left    REMOVAL OF BILATERAL TISSUE EXPANDERS WITH PLACEMENT OF BILATERAL BREAST IMPLANTS Bilateral 06/17/2015   Procedure: DEBRIDEMENT OF BILATERAL MASTECTOMY FLAP WITH BILATERAL TISSUE EXPANDER EXCHANGE ;  Surgeon: Irene Limbo, MD;  Location: [facility];  Service: Plastics;  Laterality: Bilateral;   REMOVAL OF TISSUE EXPANDER Bilateral 07/12/2015   Procedure: REMOVAL OF BILATERAL TISSUE EXPANDERS;  Surgeon: Irene Limbo, MD;  Location: [facility];  Service: Plastics;  Laterality: Bilateral;    Family History  Problem Relation Age of Onset   Cervical cancer Mother 1   Colon cancer Maternal Grandfather        mets to stomach; dx. 67-70   Heart disease Father    Prostate cancer Father 71   Cervical cancer Maternal Grandmother        dx. 44s; treated with radium implant   Lung cancer Sister 36       maternal half-sister dx. lung cancer, stage III   Breast cancer Maternal Aunt    Cervical cancer Sister 29       paternal half-sister; s/p TAH   Spina bifida Grandchild    Brain cancer Grandchild        grandson dx. at 20 mos, treated at [facility] Other 1       maternal half-sister's daughter   Cancer Maternal Aunt        d. 22; unspecified type - "began at back area and moved to vital organs"   Cancer Maternal Uncle         late 63s; unspecified type; "started at back and moved to vital organs"   Social History   Socioeconomic History   Marital status: Widowed    Spouse name: Not on file   Number of children: 1   Years of education: 74   Highest education level: Not on file  Occupational History  Comment: [employer], retired   Tobacco Use   Smoking status: Former    Packs/day: 0.25    Types: Cigarettes    Quit date: 03/21/2020    Years since quitting: 0.8   Smokeless tobacco: Never  Vaping Use   Vaping Use: Never used  Substance and Sexual Activity   Alcohol use: Yes    Alcohol/week: 0.0 standard drinks    Comment: rarely   Drug use: No   Sexual activity: Not on file  Other Topics Concern   Not on file  Social History Narrative   Lives alone, widow   Caffeine use- tea, 1 cup daily   Social Determinants of Health   Financial Resource Strain: Not on file  Food Insecurity: Not on file  Transportation Needs: Not on file  Physical Activity: Not on file  Stress: Not on file  Social Connections: Not on file    Review of Systems  Constitutional:  Positive for fatigue and unexpected weight change. Negative for appetite change and fever.  HENT:  Positive for rhinorrhea. Negative for congestion, ear pain, sinus pressure and sore throat.   Eyes:  Positive for visual disturbance (needs to make an eye appt). Negative for pain.  Respiratory:  Positive for cough and shortness of breath (baseline). Negative for chest tightness and wheezing.   Cardiovascular:  Negative for chest pain and palpitations.  Gastrointestinal:  Negative for abdominal pain, constipation, diarrhea, nausea and vomiting.  Endocrine: Positive for polyphagia.  Genitourinary:  Negative for dysuria and hematuria.  Musculoskeletal:  Positive for back pain (chronic back pain.). Negative for arthralgias, joint swelling and myalgias.  Skin:  Negative for rash.  Neurological:  Positive for headaches. Negative for dizziness and weakness.  Psychiatric/Behavioral:  Negative for dysphoric mood. The patient is not nervous/anxious.    Fell twice yesterday. Her body jerks and legs give out on her and she hit the bed. No injuries.   Objective:  BP 136/70   Pulse 70   Temp 98.2 F (36.8 C)   Resp 16   Ht 5' (1.524 m)   Wt 96 lb 9.6 oz (43.8 kg)    BMI 18.87 kg/m   BP/Weight 01/30/2021 12/21/2020 0/86/7619  Systolic BP 509 326 712  Diastolic BP 70 68 98  Wt. (Lbs) 96.6 97 103.2  BMI 18.87 18.94 20.15    Physical Exam Vitals reviewed.  Constitutional:      Appearance: Normal appearance. She is normal weight.  Neck:     Vascular: No carotid bruit.  Cardiovascular:     Rate and Rhythm: Normal rate and regular rhythm.     Pulses: Normal pulses.     Heart sounds: Normal heart sounds.  Pulmonary:     Effort: Pulmonary effort is normal. No respiratory distress.     Breath sounds: Normal breath sounds.  Abdominal:     General: Abdomen is flat. Bowel sounds are normal.     Palpations: Abdomen is soft.     Tenderness: There is no abdominal tenderness.  Musculoskeletal:     Comments: Positive tinels and phalens BL. Rt> lt.  Neurological:     Mental Status: She is alert and oriented to person, place, and time.  Psychiatric:        Mood and Affect: Mood normal.        Behavior: Behavior normal.    Diabetic Foot Exam - Simple   No data filed      Lab Results  Component Value Date   WBC 5.8  10/03/2020   HGB 11.8 10/03/2020   HCT 38.4 10/03/2020   PLT 212 10/03/2020   GLUCOSE 79 10/03/2020   CHOL 201 (H) 10/03/2020   TRIG 138 10/03/2020   HDL 54 10/03/2020   LDLCALC 123 (H) 10/03/2020   ALT 15 10/03/2020   AST 23 10/03/2020   NA 142 10/03/2020   K 4.1 10/03/2020   CL 102 10/03/2020   CREATININE 0.79 10/03/2020   BUN 16 10/03/2020   CO2 26 10/03/2020   TSH 1.320 03/01/2020      Assessment & Plan:   Problem List Items Addressed This Visit       Cardiovascular and Mediastinum   Chronic migraine without aura without status migrainosus, not intractable    Consider increase of propranolol.      RESOLVED: Essential hypertension, benign   Relevant Orders   CBC with Differential/Platelet   Comprehensive metabolic panel   TSH   Acute migraine    Toradol shot given.  Consider increase propranolol       Aortic atherosclerosis (HCC)    Continue treatment of cholesterol.  Consider addition of aspirin 81 mg qd.         Respiratory   COPD mixed type ([place]) - Primary    Continue trelegy and albuterol Recommend smoking cessation.        Nervous and Auditory   Bilateral carpal tunnel syndrome    Recommend try wrist braces. I think this is the likely reason she has issues with her hands jerking and dropping things at times. Despite having cts previously, she may have scar tissue causing median nerve impingement.         Other   Hyperlipidemia    Well controlled.  No changes to medicines.  Continue to work on eating a healthy diet and exercise.  Labs drawn today.        Relevant Orders   Lipid panel   Memory loss    Check B12, MMA, and folate      Relevant Orders   B12 and Folate Panel   Methylmalonic acid, serum   Moderate recurrent major depression ([place])    Greatly improved.  Patient is concerned jerking movements in her hands may be tardive dyskinesia.  She is improved significantly on Abilify.  I am not convinced this is the cause of her jerking hand movements.      Lumbar back pain     She saw Dr. Donivan Scull briefly but has not returned.  Continue current medications. I have counseled her on not overdoing.      RESOLVED: Vitamin D deficiency  .  Meds ordered this encounter  Medications   ketorolac (TORADOL) injection 60 mg     Orders Placed This Encounter  Procedures   CBC with Differential/Platelet   Comprehensive metabolic panel   Lipid panel   TSH   B12 and Folate Panel   Methylmalonic acid, serum      Follow-up: Return in about 3 months (around 05/02/2021) for chronic fasting.  An After Visit Summary was printed and given to the patient.    I,Lauren M Auman,acting as a scribe for [name], MD.,have documented all relevant documentation on the behalf of [name], MD,as directed by  [name], MD while in the presence of [name], MD.     [name], MD [place] [phone]

## 2021-01-30 ENCOUNTER — Ambulatory Visit (INDEPENDENT_AMBULATORY_CARE_PROVIDER_SITE_OTHER): Payer: Medicare Other | Admitting: Family Medicine

## 2021-01-30 ENCOUNTER — Encounter: Payer: Self-pay | Admitting: Family Medicine

## 2021-01-30 ENCOUNTER — Other Ambulatory Visit: Payer: Self-pay

## 2021-01-30 VITALS — BP 136/70 | HR 70 | Temp 98.2°F | Resp 16 | Ht 60.0 in | Wt 96.6 lb

## 2021-01-30 DIAGNOSIS — E559 Vitamin D deficiency, unspecified: Secondary | ICD-10-CM

## 2021-01-30 DIAGNOSIS — E782 Mixed hyperlipidemia: Secondary | ICD-10-CM | POA: Diagnosis not present

## 2021-01-30 DIAGNOSIS — J449 Chronic obstructive pulmonary disease, unspecified: Secondary | ICD-10-CM | POA: Diagnosis not present

## 2021-01-30 DIAGNOSIS — G43909 Migraine, unspecified, not intractable, without status migrainosus: Secondary | ICD-10-CM

## 2021-01-30 DIAGNOSIS — I10 Essential (primary) hypertension: Secondary | ICD-10-CM | POA: Diagnosis not present

## 2021-01-30 DIAGNOSIS — G43709 Chronic migraine without aura, not intractable, without status migrainosus: Secondary | ICD-10-CM

## 2021-01-30 DIAGNOSIS — G5603 Carpal tunnel syndrome, bilateral upper limbs: Secondary | ICD-10-CM

## 2021-01-30 DIAGNOSIS — I700 Atherosclerosis of aorta: Secondary | ICD-10-CM

## 2021-01-30 DIAGNOSIS — R413 Other amnesia: Secondary | ICD-10-CM

## 2021-01-30 DIAGNOSIS — F331 Major depressive disorder, recurrent, moderate: Secondary | ICD-10-CM

## 2021-01-30 DIAGNOSIS — M545 Low back pain, unspecified: Secondary | ICD-10-CM

## 2021-01-30 HISTORY — DX: Atherosclerosis of aorta: I70.0

## 2021-01-30 HISTORY — DX: Migraine, unspecified, not intractable, without status migrainosus: G43.909

## 2021-01-30 HISTORY — DX: Carpal tunnel syndrome, bilateral upper limbs: G56.03

## 2021-01-30 MED ORDER — KETOROLAC TROMETHAMINE 60 MG/2ML IM SOLN
60.0000 mg | Freq: Once | INTRAMUSCULAR | Status: AC
  Administered 2021-01-30: 12:00:00 60 mg via INTRAMUSCULAR

## 2021-01-30 NOTE — Assessment & Plan Note (Signed)
Greatly improved.  Patient is concerned jerking movements in her hands may be tardive dyskinesia.  She is improved significantly on Abilify.  I am not convinced this is the cause of her jerking hand movements.

## 2021-01-30 NOTE — Assessment & Plan Note (Signed)
Continue trelegy and albuterol Recommend smoking cessation.

## 2021-01-30 NOTE — Assessment & Plan Note (Signed)
Consider increase of propranolol.

## 2021-01-30 NOTE — Assessment & Plan Note (Signed)
Toradol shot given.  Consider increase propranolol

## 2021-01-30 NOTE — Assessment & Plan Note (Signed)
She saw Dr. Donivan Scull briefly but has not returned.  Continue current medications. I have counseled her on not overdoing.

## 2021-01-30 NOTE — Assessment & Plan Note (Signed)
Well controlled.  ?No changes to medicines.  ?Continue to work on eating a healthy diet and exercise.  ?Labs drawn today.  ?

## 2021-01-30 NOTE — Assessment & Plan Note (Signed)
Check B12, MMA, and folate

## 2021-01-30 NOTE — Assessment & Plan Note (Signed)
Continue treatment of cholesterol.  Consider addition of aspirin 81 mg qd.

## 2021-01-30 NOTE — Assessment & Plan Note (Signed)
Recommend try wrist braces. I think this is the likely reason she has issues with her hands jerking and dropping things at times. Despite having cts previously, she may have scar tissue causing median nerve impingement.

## 2021-01-31 LAB — TSH: TSH: 1.1 u[IU]/mL (ref 0.450–4.500)

## 2021-01-31 LAB — LIPID PANEL
Chol/HDL Ratio: 2.1 ratio (ref 0.0–4.4)
Cholesterol, Total: 132 mg/dL (ref 100–199)
HDL: 64 mg/dL (ref >39)
LDL Chol Calc (NIH): 45 mg/dL (ref 0–99)
Triglycerides: 133 mg/dL (ref 0–149)
VLDL Cholesterol Cal: 23 mg/dL (ref 5–40)

## 2021-01-31 LAB — CBC WITH DIFFERENTIAL/PLATELET
Basophils Absolute: 0.1 10*3/uL (ref 0.0–0.2)
Basos: 1 %
EOS (ABSOLUTE): 0.2 10*3/uL (ref 0.0–0.4)
Eos: 2 %
Hematocrit: 37.7 % (ref 34.0–46.6)
Hemoglobin: 12.2 g/dL (ref 11.1–15.9)
Immature Grans (Abs): 0 10*3/uL (ref 0.0–0.1)
Immature Granulocytes: 0 %
Lymphocytes Absolute: 2.1 10*3/uL (ref 0.7–3.1)
Lymphs: 23 %
MCH: 29 pg (ref 26.6–33.0)
MCHC: 32.4 g/dL (ref 31.5–35.7)
MCV: 90 fL (ref 79–97)
Monocytes Absolute: 0.8 10*3/uL (ref 0.1–0.9)
Monocytes: 9 %
Neutrophils Absolute: 5.8 10*3/uL (ref 1.4–7.0)
Neutrophils: 65 %
Platelets: 220 10*3/uL (ref 150–450)
RBC: 4.2 x10E6/uL (ref 3.77–5.28)
RDW: 12.7 % (ref 11.7–15.4)
WBC: 9 10*3/uL (ref 3.4–10.8)

## 2021-01-31 LAB — COMPREHENSIVE METABOLIC PANEL
ALT: 16 IU/L (ref 0–32)
AST: 25 IU/L (ref 0–40)
Albumin/Globulin Ratio: 2 (ref 1.2–2.2)
Albumin: 4 g/dL (ref 3.7–4.7)
Alkaline Phosphatase: 106 IU/L (ref 44–121)
BUN/Creatinine Ratio: 24 (ref 12–28)
BUN: 22 mg/dL (ref 8–27)
Bilirubin Total: <0.2 mg/dL (ref 0.0–1.2)
CO2: 24 mmol/L (ref 20–29)
Calcium: 9 mg/dL (ref 8.7–10.3)
Chloride: 103 mmol/L (ref 96–106)
Creatinine, Ser: 0.92 mg/dL (ref 0.57–1.00)
Globulin, Total: 2 g/dL (ref 1.5–4.5)
Glucose: 72 mg/dL (ref 70–99)
Potassium: 4.8 mmol/L (ref 3.5–5.2)
Sodium: 140 mmol/L (ref 134–144)
Total Protein: 6 g/dL (ref 6.0–8.5)
eGFR: 65 mL/min/{1.73_m2} (ref >59)

## 2021-01-31 LAB — B12 AND FOLATE PANEL
Folate: 3 ng/mL — ABNORMAL LOW (ref >3.0)
Vitamin B-12: 520 pg/mL (ref 232–1245)

## 2021-01-31 LAB — CARDIOVASCULAR RISK ASSESSMENT

## 2021-01-31 LAB — METHYLMALONIC ACID, SERUM: Methylmalonic Acid: 327 nmol/L (ref 0–378)

## 2021-02-01 ENCOUNTER — Other Ambulatory Visit: Payer: Self-pay | Admitting: Family Medicine

## 2021-02-03 ENCOUNTER — Other Ambulatory Visit: Payer: Self-pay | Admitting: Family Medicine

## 2021-02-03 DIAGNOSIS — M5416 Radiculopathy, lumbar region: Secondary | ICD-10-CM

## 2021-02-03 MED ORDER — ARIPIPRAZOLE 15 MG PO TABS
15.0000 mg | ORAL_TABLET | Freq: Every day | ORAL | 0 refills | Status: DC
Start: 2021-02-03 — End: 2021-06-01

## 2021-02-03 MED ORDER — ALBUTEROL SULFATE HFA 108 (90 BASE) MCG/ACT IN AERS: INHALATION_SPRAY | RESPIRATORY_TRACT | 12 refills | Status: DC

## 2021-02-03 MED ORDER — HYDROCODONE-ACETAMINOPHEN 5-325 MG PO TABS: 1.0000 | ORAL_TABLET | Freq: Every day | ORAL | 0 refills | Status: DC | PRN

## 2021-02-07 ENCOUNTER — Other Ambulatory Visit: Payer: Self-pay

## 2021-02-08 MED ORDER — PROPRANOLOL HCL 20 MG PO TABS
20.0000 mg | ORAL_TABLET | Freq: Two times a day (BID) | ORAL | 1 refills | Status: DC
Start: 2021-02-08 — End: 2021-06-01

## 2021-02-08 MED ORDER — FUROSEMIDE 20 MG PO TABS
20.0000 mg | ORAL_TABLET | Freq: Every day | ORAL | 1 refills | Status: DC | PRN
Start: 2021-02-08 — End: 2021-08-14

## 2021-02-08 MED ORDER — SUMATRIPTAN SUCCINATE 100 MG PO TABS
ORAL_TABLET | ORAL | 3 refills | Status: DC
Start: 2021-02-08 — End: 2021-05-29

## 2021-02-13 ENCOUNTER — Telehealth: Payer: Self-pay

## 2021-02-13 ENCOUNTER — Ambulatory Visit: Payer: Medicare Other | Admitting: Family Medicine

## 2021-02-13 NOTE — Telephone Encounter (Signed)
The pt was scheduled this morning for the following: Pt got lost in the [place] and was unable to tell anyone where she parked her car. She got lost on the way home. That being said the pt missed her scheduled appointment for this morning at 9:45. I called both numbers listed in the pts chart but was only able to leave a message on one of the phones.  After verifying her emergency contact and HIPAA form I called bother numbers listed for her Anne [name] on emergency contact and on the HIPAA form.  I spoke with the son who stated he will go check on her. He will call me back in a few minutes.

## 2021-02-13 NOTE — Telephone Encounter (Signed)
Pts son called back stating Anne [name] is fine. She feel asleep. He checked her vitals. The son does not have any concerns about her health state at this time. He is going to check on her in a few hours again. He will have her call us back to RS. He stated that she has been tried and he is going to let her sleep.

## 2021-02-13 NOTE — Telephone Encounter (Signed)
Anne [name] called back this afternoon to RS her appointment from this morning. I stated to her what time her appointment was this morning and that we spoke with her son, who checked on her. That being said, the pt had no knowledge of seeing or speaking with her son. Pt was information that with the symptoms she is having she will need to go to the ED to be seen today. Pt stated that she knows how to get to the ED.

## 2021-02-13 NOTE — Telephone Encounter (Signed)
error 

## 2021-02-13 NOTE — Telephone Encounter (Signed)
Per Dr. Tobie Poet, when the pt calls back she will need to be seen at the ED due to the symptoms she is having. Front staff notified and Hoyle Sauer, RN has been notified.

## 2021-02-13 NOTE — Telephone Encounter (Signed)
Anne [name] and her family do not want to go to the ED.  They would prefer to come into the office for evaluation and tx.  Follow-up appointment scheduled for tomorrow.

## 2021-02-14 ENCOUNTER — Encounter: Payer: Self-pay | Admitting: Nurse Practitioner

## 2021-02-14 ENCOUNTER — Ambulatory Visit (INDEPENDENT_AMBULATORY_CARE_PROVIDER_SITE_OTHER): Payer: Medicare Other | Admitting: Nurse Practitioner

## 2021-02-14 ENCOUNTER — Other Ambulatory Visit: Payer: Self-pay

## 2021-02-14 VITALS — BP 98/64 | HR 74 | Temp 97.2°F | Ht 63.0 in | Wt 98.2 lb

## 2021-02-14 DIAGNOSIS — R531 Weakness: Secondary | ICD-10-CM | POA: Diagnosis not present

## 2021-02-14 DIAGNOSIS — R410 Disorientation, unspecified: Secondary | ICD-10-CM | POA: Diagnosis not present

## 2021-02-14 DIAGNOSIS — Z79899 Other long term (current) drug therapy: Secondary | ICD-10-CM

## 2021-02-14 DIAGNOSIS — Z818 Family history of other mental and behavioral disorders: Secondary | ICD-10-CM | POA: Diagnosis not present

## 2021-02-14 DIAGNOSIS — Z681 Body mass index (BMI) 19 or less, adult: Secondary | ICD-10-CM

## 2021-02-14 DIAGNOSIS — G4709 Other insomnia: Secondary | ICD-10-CM

## 2021-02-14 DIAGNOSIS — R296 Repeated falls: Secondary | ICD-10-CM

## 2021-02-14 DIAGNOSIS — R54 Age-related physical debility: Secondary | ICD-10-CM

## 2021-02-14 DIAGNOSIS — J449 Chronic obstructive pulmonary disease, unspecified: Secondary | ICD-10-CM

## 2021-02-14 LAB — POCT URINALYSIS DIPSTICK
Blood, UA: NEGATIVE
Glucose, UA: NEGATIVE
Ketones, UA: NEGATIVE
Nitrite, UA: NEGATIVE
Protein, UA: POSITIVE — AB
Spec Grav, UA: >=1.03 — AB (ref 1.010–1.025)
Urobilinogen, UA: 0.2 E.U./dL
pH, UA: 5.5 (ref 5.0–8.0)

## 2021-02-14 NOTE — Progress Notes (Signed)
Acute Office Visit  Subjective:    Patient ID: Anne [name], female    DOB: Sep 10, 1945, 75 y.o.   MRN: [id number]  Chief Complaint  Patient presents with   Altered Mental Status    HPI: Anne [name] is a 75 year old Caucasian female that presents for evaluation of new onset confusion, impaired memory, and frequent falls. She is accompanied by her adult son. She tells me she drove to a mall in [place] a few days ago. She became disoriented and confused, she was unable to find her car in the parking lot and struggled finding her way home. 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 States she is not sleeping well. Typically averages btwn 3-4 hours of sleep a night, feels fatigued. She has tried numerous prescription sleep aids without success. She has not undergone a sleep study or evaluation by sleep specialist. She tells me that she has requested a transfer to day-shift. BMI 17.40. She tells me she has poor appetite and intake. States she eats once a day. She has COPD with active smoking. States she has a history of COPD, quit smoking January 17th, 2022.  Denies any UTI or URI symptoms. Scored 28/30 MME. MRI of Brain 04/18/2020 was unremarkable. 01/30/2021: MMA 327, B12 520 and Folate 3.0.   Past Medical History:  Diagnosis Date   Abnormal MRI of the head 02/08/2014   Formatting of this note might be different from the original. History of abn MRI brain in 2015 due to concussion. Formatting of this note might be different from the original. Overview:  History of abn MRI brain in 2015 due to concussion.   Acquired absence of both breasts and nipples 06/08/2015   Asthma    daily inhalers   Breast cancer ([place]) 05/2015   left   Breast cancer of upper-inner quadrant of left female breast ([place]) 04/15/2015   COPD (chronic obstructive pulmonary disease) ([place])    denies SOB with ADLs; no O2   Dental crowns present    Depression    Family history of  breast cancer in female 04/26/2015   Dx. Niece at 33; dx. Maternal aunt in her late 5s  Formatting of this note might be different from the original. Overview:  Dx. Niece at 37; dx. Maternal aunt in her late 9s   Family history of colon cancer 04/26/2015   Dx. In maternal grandfather in his late 8s-70  Formatting of this note might be different from the original. Overview:  Dx. In maternal grandfather in his late 84s-70   Genetic testing 06/02/2015   Negative for pathogenic mutations within any of 20 genes on the Breast/Ovarian Cancer Panel through [employer] [facility] [employer] [facility].  No variants of uncertain significance (VUSes) were found.  The Breast/Ovarian Cancer Panel offered by [facility] Hope Pigeon, MD) includes sequencing and deletion/duplication analysis for the following 19 genes:  ATM, BARD1, BRCA1, BRCA2, BRIP1, CDH1, CHEK2, FANCC, M   History of basal cell carcinoma 04/26/2015   Dx. 70  Formatting of this note might be different from the original. Overview:  Dx. 67   History of concussion 01/01/2014   History of kidney stones    History of TIA (transient ischemic attack) 10/2014   Hyperlipidemia    Insomnia    Malrotation of intestine 09/23/2017   Migraine    Nasal congestion 05/26/2015   will finish z-pak 05/27/2015   Nonproductive cough 05/26/2015   PONV (postoperative nausea and vomiting)    "  long time ago"  none recently   Psychogenic syncope 04/23/2019   RLS (restless legs syndrome)    Tobacco abuse    Vitamin D deficiency    Wears dentures    lower   Wears partial dentures    upper   Weight loss 08/28/2019    Past Surgical History:  Procedure Laterality Date   ABDOMINAL HYSTERECTOMY     APPENDECTOMY  2000   BREAST BIOPSY Left    BREAST RECONSTRUCTION WITH PLACEMENT OF TISSUE EXPANDER AND FLEX HD (ACELLULAR HYDRATED DERMIS) Bilateral 06/02/2015   Procedure: BILATERAL BREAST RECONSTRUCTION WITH PLACEMENT OF TISSUE EXPANDER AND  ACELLULAR HYDRATED DERMIS;  Surgeon: Irene Limbo, MD;  Location: [facility];  Service: Plastics;  Laterality: Bilateral;   CARPAL TUNNEL RELEASE Bilateral    CATARACT EXTRACTION Bilateral 09/2013   KNEE ARTHROSCOPY Left    LAPAROSCOPIC ABDOMINAL EXPLORATION N/A 10/2017   LAPAROSCOPIC LYSIS OF CONGENITAL ADHESIVE BAND/LADD'S BANDS WIH INTRAOPERATIVE CHOLANGIOGRAPHY. Perforation of small intestion requiring repair.    LOOP RECORDER INSERTION N/A 05/11/2016   Procedure: Loop Recorder Insertion;  Surgeon: Will Meredith Leeds, MD;  Location: [facility];  Service: Cardiovascular;  Laterality: N/A;   MASTECTOMY W/ SENTINEL NODE BIOPSY Bilateral 06/02/2015   Procedure: BILATERAL MASTECTOMY WITH LEFT SENTINEL LYMPH NODE BIOPSY;  Surgeon: Stark Klein, MD;  Location: [facility];  Service: General;  Laterality: Bilateral;   NASAL SEPTUM SURGERY     x 3   PLANTAR FASCIA SURGERY Left    REMOVAL OF BILATERAL TISSUE EXPANDERS WITH PLACEMENT OF BILATERAL BREAST IMPLANTS Bilateral 06/17/2015   Procedure: DEBRIDEMENT OF BILATERAL MASTECTOMY FLAP WITH BILATERAL TISSUE EXPANDER EXCHANGE ;  Surgeon: Irene Limbo, MD;  Location: [facility];  Service: Plastics;  Laterality: Bilateral;   REMOVAL OF TISSUE EXPANDER Bilateral 07/12/2015   Procedure: REMOVAL OF BILATERAL TISSUE EXPANDERS;  Surgeon: Irene Limbo, MD;  Location: [facility];  Service: Plastics;  Laterality: Bilateral;    Family History  Problem Relation Age of Onset   Cervical cancer Mother 4   Colon cancer Maternal Grandfather        mets to stomach; dx. 67-70   Heart disease Father    Prostate cancer Father 3   Cervical cancer Maternal Grandmother        dx. 79s; treated with radium implant   Lung cancer Sister 60       maternal half-sister dx. lung cancer, stage III   Breast cancer Maternal Aunt    Cervical cancer Sister 80       paternal half-sister; s/p TAH   Spina bifida Grandchild    Brain cancer Grandchild        grandson  dx. at 20 mos, treated at [facility] Other 30       maternal half-sister's daughter   Cancer Maternal Aunt        d. 68; unspecified type - "began at back area and moved to vital organs"   Cancer Maternal Uncle         late 19s; unspecified type; "started at back and moved to vital organs"    Social History   Socioeconomic History   Marital status: Widowed    Spouse name: Not on file   Number of children: 1   Years of education: 21   Highest education level: Not on file  Occupational History    Comment: [employer], retired  Tobacco Use   Smoking status: Former  Packs/day: 0.25    Types: Cigarettes    Quit date: 03/21/2020    Years since quitting: 0.9   Smokeless tobacco: Never  Vaping Use   Vaping Use: Never used  Substance and Sexual Activity   Alcohol use: Yes    Alcohol/week: 0.0 standard drinks    Comment: rarely   Drug use: No   Sexual activity: Not on file  Other Topics Concern   Not on file  Social History Narrative   Lives alone, widow   Caffeine use- tea, 1 cup daily   Social Determinants of Health   Financial Resource Strain: Not on file  Food Insecurity: Not on file  Transportation Needs: Not on file  Physical Activity: Not on file  Stress: Not on file  Social Connections: Not on file  Intimate Partner Violence: Not on file    Outpatient Medications Prior to Visit  Medication Sig Dispense Refill   albuterol (VENTOLIN HFA) 108 (90 Base) MCG/ACT inhaler INHALE 1 TO 2 PUFFS BY MOUTH EVERY 6 HOURS AS NEEDED FOR WHEEZING FOR SHORTNESS OF BREATH 18 g 12   ARIPiprazole (ABILIFY) 15 MG tablet Take 1 tablet (15 mg total) by mouth daily. 30 tablet 0   atorvastatin (LIPITOR) 20 MG tablet Take 1 tablet (20 mg total) by mouth daily. 90 tablet 0   cetirizine (ZYRTEC) 10 MG tablet Take 1 tablet (10 mg total) by mouth daily. 30 tablet 11   Cholecalciferol (VITAMIN D3) 50000 UNITS CAPS Take 50,000 Units by mouth once a week.      ezetimibe  (ZETIA) 10 MG tablet Take 1 tablet (10 mg total) by mouth daily. 30 tablet 2   fenofibrate 160 MG tablet Take 1 tablet (160 mg total) by mouth daily. 90 tablet 0   Fluticasone-Umeclidin-Vilant (TRELEGY ELLIPTA) 200-62.5-25 MCG/INH AEPB Inhale 1 puff into the lungs daily. 28 each 0   furosemide (LASIX) 20 MG tablet Take 1 tablet (20 mg total) by mouth daily as needed for edema (for leg swelling). 90 tablet 1   gabapentin (NEURONTIN) 300 MG capsule Take 2 capsules (600 mg total) by mouth 3 (three) times daily. 180 capsule 1   HYDROcodone-acetaminophen (NORCO/VICODIN) 5-325 MG tablet Take 1 tablet by mouth daily as needed for severe pain (migraines). 30 tablet 0   ipratropium-albuterol (DUONEB) 0.5-2.5 (3) MG/3ML SOLN Take 3 mLs by nebulization every 6 (six) hours as needed. 360 mL 12   meloxicam (MOBIC) 15 MG tablet TAKE 1 TABLET EVERY DAY 90 tablet 1   mirtazapine (REMERON) 45 MG tablet TAKE 1 TABLET (45 MG TOTAL) BY MOUTH AT BEDTIME. 90 tablet 1   omeprazole (PRILOSEC) 40 MG capsule TAKE 1 CAPSULE TWICE DAILY 180 capsule 1   orphenadrine (NORFLEX) 100 MG tablet TAKE 1 TABLET (100 MG TOTAL) BY MOUTH 2 (TWO) TIMES DAILY AS NEEDED FOR MUSCLE SPASMS. 180 tablet 0   potassium chloride SA (KLOR-CON) 20 MEQ tablet TAKE 1 TABLET BY MOUTH ONCE DAILY WITH FOOD     primidone (MYSOLINE) 50 MG tablet TAKE 1 TABLET BY MOUTH IN THE MORNING AND TAKE 1 TABLET AT BEDTIME 180 tablet 0   promethazine (PHENERGAN) 25 MG tablet Take 1 tablet by mouth as needed for nausea/vomiting.     propranolol (INDERAL) 20 MG tablet Take 1 tablet (20 mg total) by mouth 2 (two) times daily. 180 tablet 1   SUMAtriptan (IMITREX) 100 MG tablet May repeat once in 2 hours, if headache persists or recurs. No more than 2 in 24 hours. 10 tablet  3   temazepam (RESTORIL) 15 MG capsule TAKE 1 CAPSULE BY MOUTH ONCE DAILY AT BEDTIME AS NEEDED 45 capsule 0   traZODone (DESYREL) 100 MG tablet TAKE 1 TABLET (100 MG TOTAL) BY MOUTH AT BEDTIME AS NEEDED  FOR SLEEP. 90 tablet 1   venlafaxine XR (EFFEXOR-XR) 75 MG 24 hr capsule Take 2 capsules (150 mg total) by mouth daily with breakfast. 1 capsule 0   Vitamin D, Ergocalciferol, (DRISDOL) 1.25 MG (50000 UNIT) CAPS capsule  (Patient not taking: Reported on 01/30/2021)     No facility-administered medications prior to visit.    Allergies  Allergen Reactions   Rocephin [Ceftriaxone Sodium In Dextrose] Shortness Of Breath and Rash   Clindamycin/Lincomycin Nausea And Vomiting   Lyrica [Pregabalin] Other (See Comments)    PERSONALITY CHANGE    Review of Systems  Constitutional:  Positive for appetite change (decreased). Negative for chills, fever and unexpected weight change.  HENT:  Negative for congestion and sore throat.   Eyes: Negative.   Respiratory:  Positive for cough (chronic). Negative for shortness of breath.   Cardiovascular:  Negative for chest pain.  Gastrointestinal:  Negative for abdominal pain, nausea and vomiting.  Endocrine: Negative.   Genitourinary:  Negative for dysuria, flank pain, frequency and urgency.  Musculoskeletal:  Positive for back pain (chronic).  Allergic/Immunologic: Negative.   Neurological:  Positive for weakness and headaches (chronic migraines). Negative for dizziness.  Psychiatric/Behavioral:  Positive for confusion (intermittent), decreased concentration and sleep disturbance (insomnia).       Objective:    Physical Exam Vitals reviewed.  Constitutional:      Appearance: She is ill-appearing (frail and thin).  HENT:     Head: Normocephalic.     Right Ear: Tympanic membrane normal.     Left Ear: Tympanic membrane normal.     Mouth/Throat:     Mouth: Mucous membranes are dry.  Eyes:     Pupils: Pupils are equal, round, and reactive to light.  Cardiovascular:     Rate and Rhythm: Normal rate and regular rhythm.     Pulses: Normal pulses.     Heart sounds: Normal heart sounds.  Pulmonary:     Effort: Pulmonary effort is normal.     Breath  sounds: Normal breath sounds.  Abdominal:     General: Bowel sounds are normal.     Palpations: Abdomen is soft.  Musculoskeletal:        General: Normal range of motion.  Skin:    General: Skin is warm and dry.     Capillary Refill: Capillary refill takes less than 2 seconds.  Neurological:     General: No focal deficit present.     Mental Status: She is alert and oriented to person, place, and time.     Cranial Nerves: Cranial nerves 2-12 are intact.     Motor: Motor function is intact.     Coordination: Romberg sign negative. Coordination normal. Finger-Nose-Finger Test and Heel to [facility] Test normal.     Gait: Gait is intact.  Psychiatric:        Mood and Affect: Mood normal.        Behavior: Behavior normal.    Pulse 74    Temp (!) 97.2 F (36.2 C)    Ht 5' 3" (1.6 m)    Wt 98 lb 3.2 oz (44.5 kg)    SpO2 94%    BMI 17.40 kg/m  BP 98/64    Pulse 74    Temp Marland Kitchen)  97.2 F (36.2 C)    Ht 5' 3" (1.6 m)    Wt 98 lb 3.2 oz (44.5 kg)    SpO2 94%    BMI 17.40 kg/m   Wt Readings from Last 3 Encounters:  02/14/21 98 lb 3.2 oz (44.5 kg)  01/30/21 96 lb 9.6 oz (43.8 kg)  12/21/20 97 lb (44 kg)    Health Maintenance Due  Topic Date Due   Hepatitis C Screening  Never done   Zoster Vaccines- Shingrix (1 of 2) Never done   COVID-19 Vaccine (3 - Moderna risk series) 03/29/2020   INFLUENZA VACCINE  10/03/2020    Lab Results  Component Value Date   TSH 1.100 01/30/2021   Lab Results  Component Value Date   WBC 9.0 01/30/2021   HGB 12.2 01/30/2021   HCT 37.7 01/30/2021   MCV 90 01/30/2021   PLT 220 01/30/2021   Lab Results  Component Value Date   NA 140 01/30/2021   K 4.8 01/30/2021   CO2 24 01/30/2021   GLUCOSE 72 01/30/2021   BUN 22 01/30/2021   CREATININE 0.92 01/30/2021   BILITOT <0.2 01/30/2021   ALKPHOS 106 01/30/2021   AST 25 01/30/2021   ALT 16 01/30/2021   PROT 6.0 01/30/2021   ALBUMIN 4.0 01/30/2021   CALCIUM 9.0 01/30/2021   ANIONGAP 3 (L) 07/03/2016   EGFR  65 01/30/2021   Lab Results  Component Value Date   CHOL 132 01/30/2021   Lab Results  Component Value Date   HDL 64 01/30/2021   Lab Results  Component Value Date   LDLCALC 45 01/30/2021   Lab Results  Component Value Date   TRIG 133 01/30/2021   Lab Results  Component Value Date   CHOLHDL 2.1 01/30/2021        Assessment & Plan:   1. Confusion - POCT urinalysis dipstick - Ambulatory referral to Neurology - CT HEAD WO CONTRAST (5MM); Future - Urine Culture - CBC with Differential/Platelet - Comprehensive metabolic panel  2. Frequent falls - Ambulatory referral to Neurology - Ambulatory referral to Physical Therapy - CT HEAD WO CONTRAST (5MM); Future - CBC with Differential/Platelet - Comprehensive metabolic panel  3. Family history of dementia - CT HEAD WO CONTRAST (5MM); Future  4. Generalized weakness - CT HEAD WO CONTRAST (5MM); Future  5. Other insomnia - Ambulatory referral to Neurology - Ambulatory referral to Sleep Studies  6. Polypharmacy - AMB Referral to [facility]  7. BMI less than 19,adult - CBC with Differential/Platelet - Comprehensive metabolic panel  8. COPD mixed type ([place]) - CBC with Differential/Platelet - Comprehensive metabolic panel  9. Age-related physical debility - CBC with Differential/Platelet - Comprehensive metabolic panel    We will call you with appointments for head CT, neurologist, physical therapy, and sleep specialist Increase fluid and nutrition intake, supplement with Boost shakes  Use cane at home to prevent falls  Follow-up pending CT results       Follow-up: pending CT results  An After Visit Summary was printed and given to the patient.  I, [name], NP, have reviewed all documentation for this visit. The documentation on 02/14/21 for the exam, diagnosis, procedures, and orders are all accurate and complete.   Signed, [name], NP [place] [phone]

## 2021-02-14 NOTE — Patient Instructions (Addendum)
We will call you with appointments for head CT, neurologist, physical therapy, and sleep specialist Increase fluid and nutrition intake, supplement with Boost shakes  Use cane at home to prevent falls  Follow-up pending CT results      How to Use a Cane Canes are used to help with walking. Using a cane makes you more stable, reduces pain, and eases strain on certain muscle groups. There are various kinds of canes. Most have either a single point, four points (quad cane), or three points at the bottom. The best kind of cane for you depends on what you need it for. People with arthritis generally do well with a single-point cane. People who have certain neurological conditions, such as people who have had a stroke, may do better with a quad cane because it allows them to put more weight on it (support more of their body weight). How to choose a cane that fits It is important to use a cane that fits properly. A cane fits properly if the top of the cane comes to your wrist joint when you are standing upright with your arm relaxed at your side. How to use your cane Hold your cane in the hand opposite the injured or weaker side. Always move the cane and the foot of the weaker side with each other (in unison). Walking Put as much weight on the cane as necessary to make walking comfortable, stable, and smooth. Stand tall with good posture and look ahead, not down at your feet. Hold the cane about 2 inches (5 cm) in front or to the side of you. Each time you take a step with your injured leg, move the cane at the same time to help balance you. Going up steps Step first with your stronger foot. Move the cane and the weaker foot up the step at the same time. Always hold the railing with your free hand. Going down steps Step down first with the cane and your weaker foot. Then follow with your stronger foot. Always hold the railing with your free hand. Safety tips for home Take these steps to make  your home safer when you are walking with a cane: Be familiar with your home environment. Have sturdy handrails in your bathrooms and hallways. Wear nonslip, comfortable, well-fitting shoes. Use night-lights in the dark. Keep floor surfaces clean and dry. Keep high-traffic areas uncluttered. Remove any rugs, cords, or loose objects from the floor. Contact a health care provider if: You still feel unsteady on your feet while using the cane. You develop new pain, such as pain in your back, shoulder, wrist, or hip. You develop any numbness or tingling. Get help right away if: You fall. Summary Using a cane makes you more stable, reduces pain, and eases strain on certain muscle groups. A cane fits properly if the top of the cane comes to your wrist joint when you are standing upright with your arm relaxed at your side. The best kind of cane for you depends on what you need it for. Hold your cane in the hand opposite the injured or weaker side. Always move the cane and the foot of the weaker side with each other (in unison). This information is not intended to replace advice given to you by your health care provider. Make sure you discuss any questions you have with your health care provider. Document Revised: 12/08/2019 Document Reviewed: 12/08/2019 Elsevier Patient Education  [place]  Use  a variety of textures, such as Velcro, rubber bands and raised dots to provide tactile clues.  Apply to the on/off controls on appliances, at the end of the banister, or on medicine bottles.  Flooring  The following suggestions can help reduce the risk of a fall: Repair or replace torn carpet because a foot, cane or walker can easily get caught. Remove area carpets or throw rugs, especially if your loved one has a shuffling gait or uses a walker. When rugs  Or carpeting cannot be eliminated, place non-skid padding under rugs or secure to floor with double sided tape.   Area carpets without padding or tape can easily buckle underneath when walked on, causing a person to slip or fall. Use only matte, non-shiny finishes on the floor. Doorsills can be tripping hazards.  Remove them whenever possible or paint them a contrasting color. Eliminate low furniture that is easy to trip over such as coffee tables and footstools. Move furniture against walls to create a large area of uncluttered space in the center of the room.  However, if you are rearranging a room for someone else, discuss beforehand with that person.  Many individuals rely on specific locations of furniture to find their way around a room. It is easier to see the sofa or chair when its color contrasts with that of the flooring.  Choose a fabric that contrasts with the floor material or use a bright colored piping along the edges of the seat cushion. Reduce glare on polished furniture by covering it with a large doily or tablecloth.

## 2021-02-15 ENCOUNTER — Telehealth: Payer: Self-pay | Admitting: Family Medicine

## 2021-02-15 LAB — COMPREHENSIVE METABOLIC PANEL
ALT: 26 IU/L (ref 0–32)
AST: 25 IU/L (ref 0–40)
Albumin/Globulin Ratio: 2.2 (ref 1.2–2.2)
Albumin: 3.8 g/dL (ref 3.7–4.7)
Alkaline Phosphatase: 126 IU/L — ABNORMAL HIGH (ref 44–121)
BUN/Creatinine Ratio: 20 (ref 12–28)
BUN: 15 mg/dL (ref 8–27)
Bilirubin Total: <0.2 mg/dL (ref 0.0–1.2)
CO2: 27 mmol/L (ref 20–29)
Calcium: 8.6 mg/dL — ABNORMAL LOW (ref 8.7–10.3)
Chloride: 105 mmol/L (ref 96–106)
Creatinine, Ser: 0.74 mg/dL (ref 0.57–1.00)
Globulin, Total: 1.7 g/dL (ref 1.5–4.5)
Glucose: 82 mg/dL (ref 70–99)
Potassium: 4.1 mmol/L (ref 3.5–5.2)
Sodium: 141 mmol/L (ref 134–144)
Total Protein: 5.5 g/dL — ABNORMAL LOW (ref 6.0–8.5)
eGFR: 85 mL/min/{1.73_m2} (ref >59)

## 2021-02-15 LAB — CBC WITH DIFFERENTIAL/PLATELET
Basophils Absolute: 0.1 10*3/uL (ref 0.0–0.2)
Basos: 1 %
EOS (ABSOLUTE): 0.3 10*3/uL (ref 0.0–0.4)
Eos: 4 %
Hematocrit: 33.8 % — ABNORMAL LOW (ref 34.0–46.6)
Hemoglobin: 11.1 g/dL (ref 11.1–15.9)
Immature Grans (Abs): 0 10*3/uL (ref 0.0–0.1)
Immature Granulocytes: 1 %
Lymphocytes Absolute: 2.2 10*3/uL (ref 0.7–3.1)
Lymphs: 27 %
MCH: 29.3 pg (ref 26.6–33.0)
MCHC: 32.8 g/dL (ref 31.5–35.7)
MCV: 89 fL (ref 79–97)
Monocytes Absolute: 0.9 10*3/uL (ref 0.1–0.9)
Monocytes: 11 %
Neutrophils Absolute: 4.5 10*3/uL (ref 1.4–7.0)
Neutrophils: 56 %
Platelets: 230 10*3/uL (ref 150–450)
RBC: 3.79 x10E6/uL (ref 3.77–5.28)
RDW: 12.2 % (ref 11.7–15.4)
WBC: 7.9 10*3/uL (ref 3.4–10.8)

## 2021-02-15 NOTE — Telephone Encounter (Signed)
° °  Anne [name] has been scheduled for the following appointment:  WHAT: HEAD CT WHERE: [facility] DATE: 02/24/21 TIME: 4:15 PM ARRIVAL TIME  Patient has been made aware.

## 2021-02-15 NOTE — Progress Notes (Deleted)
HPI F smoker referred for sleep evaluation with concern of chronic insomnia, COPD  complicated by  Migraine, Carotid Artery Disease, Chronic Bronchitis/ COPD, Allergic Rhinitis, Tobacco Abuse, Restless Legs, Weight Loss, hx L Breast Cancer, R Lumbar Radiculopathy,   ======================================================   04/20/20- 74 yoF smoker referred for sleep evaluation with concern of chronic Insomnia, COPD  complicated by  Migraine, Carotid Artery Disease, Chronic Bronchitis/ COPD, Allergic Rhinitis, Tobacco Abuse, Restless Legs, Weight Loss, hx L Breast Cancer, R Lumbar Radiculopathy,  Meds include amitriptyline 75, Gabapentin 300 tid, Remeron 45, Temazepam 15, Zanaflex,  Norco,  Trazodone 100 hs prn sleep,  Trelegy 200, Ventolin hfa, benzonatate, Neb Duoneb(rarely used) Covid vax- 3 Moderna Flu vax- had We had requested reports of PFT and CXR from [facility], getting her to sign release today. HST- ordered in November. We could never her reach her to set this up. She didn't return calls.  Out of her breathing meds. Comes today wheezing and coughing, but mainly uncomfortable with migraine. She continues to smoke against advice.  Says she was on 6 antibiotics between August and 2 weeks ago for sinus infection which finally resolved.  Denies coughing discolored sputum. Pending CT chest at [facility].  02/16/21- 75 yoF smoker referred for sleep evaluation with concern of chronic Insomnia, COPD  complicated by  Migraine, Carotid Artery Disease, Chronic Bronchitis/ COPD, Allergic Rhinitis, Tobacco Abuse, Restless Legs, Weight Loss, hx L Breast Cancer, R Lumbar Radiculopathy,  -Meds include amitriptyline 75, Gabapentin 300 tid, Remeron 45, Temazepam 15, Zanaflex,  Norco,  Trazodone 100 hs prn sleep,  Trelegy 200, Ventolin hfa, benzonatate, Neb Duoneb(rarely used) Covid vax- 3 Moderna Flu vax- had --Dr Tobie Poet messaged, asking our help with sleep complaints. Covid vax- Flu vax-    ROS-see  HPI   + = positive Constitutional:    +weight loss, night sweats, fevers, chills, fatigue, lassitude. HEENT:   + headaches, difficulty swallowing, tooth/dental problems, sore throat,       sneezing, itching, ear ache, +nasal congestion, post nasal drip, snoring CV:    chest pain, orthopnea, PND, +swelling in lower extremities, anasarca,                                  dizziness, palpitations Resp:   +shortness of breath with exertion or at rest.               + productive cough,   non-productive cough, coughing up of blood.              change in color of mucus.  wheezing.   Skin:    rash or lesions. GI:  No-   heartburn, indigestion, +abdominal pain, nausea, vomiting, diarrhea,                 change in bowel habits,+ loss of appetite GU: dysuria, change in color of urine, no urgency or frequency.   flank pain. MS:   joint pain, stiffness, decreased range of motion, back pain. Neuro-     nothing unusual Psych:  change in mood or affect.  depression or anxiety.   memory loss.  OBJ- Physical Exam General- Alert, Oriented, Affect-appropriate, Distress- none acute, +petite/ slender Skin- rash-none, lesions- none, excoriation- none Lymphadenopathy- none Head- atraumatic            Eyes- Gross vision intact, PERRLA, conjunctivae and secretions clear            Ears-  Hearing, canals-normal            Nose- Clear, no-Septal dev, mucus, polyps, erosion, perforation             Throat- Mallampati II , mucosa clear , drainage- none, tonsils- atrophic Neck- flexible , trachea midline, no stridor , thyroid nl, carotid no bruit Chest - symmetrical excursion , unlabored           Heart/CV- RRR , no murmur , no gallop  , no rub, nl s1 s2                           - JVD- none , edema- none, stasis changes- none, varices- none           Lung- clear to P&A/+ diminished, wheeze+, cough+ active , dullness-none, rub- none           Chest wall- + loop recorder L Abd-  Br/ Gen/ Rectal- Not done, not  indicated Extrem- cyanosis- none, clubbing, none, atrophy- none, strength- nl Neuro- grossly intact to observation

## 2021-02-15 NOTE — Chronic Care Management (AMB) (Signed)
°  Chronic Care Management   Note  02/15/2021 Name: AREYA LEMMERMAN MRN: [id number] DOB: Feb 10, 1946  [name] is a 75 y.o. year old female who is a primary care patient of Cox, Kirsten, MD. I reached out to [name] by phone today in response to a referral sent by Ms. Elfriede L Tortora's PCP, Cox, Kirsten, MD.   Ms. Ury was given information about Chronic Care Management services today including:  CCM service includes personalized support from designated clinical staff supervised by her physician, including individualized plan of care and coordination with other care providers 24/7 contact phone numbers for assistance for urgent and routine care needs. Service will only be billed when office clinical staff spend 20 minutes or more in a month to coordinate care. Only one practitioner may furnish and bill the service in a calendar month. The patient may stop CCM services at any time (effective at the end of the month) by phone call to the office staff.   Patient agreed to services and verbal consent obtained.   Follow up plan:   Tatjana Secretary/administrator

## 2021-02-16 ENCOUNTER — Telehealth: Payer: Self-pay

## 2021-02-16 ENCOUNTER — Ambulatory Visit: Payer: Medicare Other | Admitting: Internal Medicine

## 2021-02-16 NOTE — Progress Notes (Signed)
Chronic Care Management Pharmacy Assistant   Name: Anne [name]  MRN: [id number] DOB: Aug 19, 1945  Reason for Encounter: Chart Prep for initial visit with CPP   Conditions to be addressed/monitored: CAD, HLD, COPD, Depression, and Asthma, Migraine, Aortic Atherosclerosis,  Carpal Tunnel, Sleep Disorder, Tobacco Abuse, Memory Loss, Frequent Falls, Tremor, Chronic pain  Recent office visits:  02/14/21 Jerrell Belfast NP. Seen for Confusion. Referral to Neurology, Physical Therapy, Sleep studies and community care coordination. No med changes.   01/30/21 [name] MD. Seen for COPD. D/C Clindamycin 300 mg and Levofloxacin 750 mg and Depo-Medrol 80 mg.   12/22/20 Orders Only. Cox, Kirsten MD. Reordered Sumatriptan 100 mg.  12/21/20 [name] MD. Seen for back pain. Started on Prednisone 20 mg for 9 days. D/C Nystatin 500,00 units, Rizatriptan 10 mg and Tamoxifen Citrate 10 mg. Administered Ceftriaxone Sodium 1g and Triamcinolone Acetonide 80 mg.  10/13/20 [name] MD. Seen for memory loss. Started Clindamycin HCI 300 mg 3 times daily, Levofloxacin 750 mg daily, Propranolol HCI 20 mg 2 times daily. Increased Abilify from 5 mg daily to 15 mg daily. D/C Primidone 50mg . Advised to stop Bactrim. Instructed to decrease Gabapentin to 300 mg three times daily.  Recent consult visits:  None in last 6 months  Hospital visits:  None in previous 6 months  Medications: Outpatient Encounter Medications as of 02/16/2021  Medication Sig Note   albuterol (VENTOLIN HFA) 108 (90 Base) MCG/ACT inhaler INHALE 1 TO 2 PUFFS BY MOUTH EVERY 6 HOURS AS NEEDED FOR WHEEZING FOR SHORTNESS OF BREATH    ARIPiprazole (ABILIFY) 15 MG tablet Take 1 tablet (15 mg total) by mouth daily.    atorvastatin (LIPITOR) 20 MG tablet Take 1 tablet (20 mg total) by mouth daily.    cetirizine (ZYRTEC) 10 MG tablet Take 1 tablet (10 mg total) by mouth daily.    Cholecalciferol (VITAMIN D3) 50000 UNITS CAPS Take 50,000  Units by mouth once a week.  05/09/2016: On average once a month (patient does not always remember every week)   ezetimibe (ZETIA) 10 MG tablet Take 1 tablet (10 mg total) by mouth daily.    fenofibrate 160 MG tablet Take 1 tablet (160 mg total) by mouth daily.    Fluticasone-Umeclidin-Vilant (TRELEGY ELLIPTA) 200-62.5-25 MCG/INH AEPB Inhale 1 puff into the lungs daily.    furosemide (LASIX) 20 MG tablet Take 1 tablet (20 mg total) by mouth daily as needed for edema (for leg swelling).    gabapentin (NEURONTIN) 300 MG capsule Take 2 capsules (600 mg total) by mouth 3 (three) times daily.    HYDROcodone-acetaminophen (NORCO/VICODIN) 5-325 MG tablet Take 1 tablet by mouth daily as needed for severe pain (migraines).    ipratropium-albuterol (DUONEB) 0.5-2.5 (3) MG/3ML SOLN Take 3 mLs by nebulization every 6 (six) hours as needed.    meloxicam (MOBIC) 15 MG tablet TAKE 1 TABLET EVERY DAY    mirtazapine (REMERON) 45 MG tablet TAKE 1 TABLET (45 MG TOTAL) BY MOUTH AT BEDTIME.    omeprazole (PRILOSEC) 40 MG capsule TAKE 1 CAPSULE TWICE DAILY    orphenadrine (NORFLEX) 100 MG tablet TAKE 1 TABLET (100 MG TOTAL) BY MOUTH 2 (TWO) TIMES DAILY AS NEEDED FOR MUSCLE SPASMS.    potassium chloride SA (KLOR-CON) 20 MEQ tablet TAKE 1 TABLET BY MOUTH ONCE DAILY WITH FOOD    primidone (MYSOLINE) 50 MG tablet TAKE 1 TABLET BY MOUTH IN THE MORNING AND TAKE 1 TABLET AT BEDTIME    promethazine (PHENERGAN) 25  MG tablet Take 1 tablet by mouth as needed for nausea/vomiting.    propranolol (INDERAL) 20 MG tablet Take 1 tablet (20 mg total) by mouth 2 (two) times daily.    SUMAtriptan (IMITREX) 100 MG tablet May repeat once in 2 hours, if headache persists or recurs. No more than 2 in 24 hours.    temazepam (RESTORIL) 15 MG capsule TAKE 1 CAPSULE BY MOUTH ONCE DAILY AT BEDTIME AS NEEDED    traZODone (DESYREL) 100 MG tablet TAKE 1 TABLET (100 MG TOTAL) BY MOUTH AT BEDTIME AS NEEDED FOR SLEEP.    venlafaxine XR (EFFEXOR-XR) 75 MG  24 hr capsule Take 2 capsules (150 mg total) by mouth daily with breakfast.    Vitamin D, Ergocalciferol, (DRISDOL) 1.25 MG (50000 UNIT) CAPS capsule  (Patient not taking: Reported on 01/30/2021)    No facility-administered encounter medications on file as of 02/16/2021.    No results found for: HGBA1C, MICROALBUR   BP Readings from Last 3 Encounters:  02/14/21 98/64  01/30/21 136/70  12/21/20 130/68      Have you seen any other providers since your last visit with PCP?   Any changes in your medications or health?   Any side effects from any medications?   Do you have an symptoms or problems not managed by your medications?   Any concerns about your health right now?   Has your provider asked that you check blood pressure, blood sugar, or follow special diet at home?   Do you get any type of exercise on a regular basis?   Can you think of a goal you would like to reach for your health?   Do you have any problems getting your medications?   Is there anything that you would like to discuss during the appointment?     Star Rating Drugs:  Medication:  Last Fill: Day Supply Atorvastatin   08/05/19  90ds  Care Gaps: Last annual wellness visit?None noted  Colonoscopy: 04/16/14 Dexa Scan: 04/59/97 If applicable:N/A Last eye exam / retinopathy screening? Last diabetic foot exam?  Future Appointments  Date Time Provider [place]  02/16/2021  9:30 AM Deneise Lever, MD [facility] None  02/17/2021 11:00 AM [facility] CCM PHARMACIST [facility] None     Elray Mcgregor, [place] Clinical Pharmacist Assistant  [phone]  Unable to reach to finish chart prep

## 2021-02-17 ENCOUNTER — Telehealth: Payer: Self-pay

## 2021-02-17 ENCOUNTER — Ambulatory Visit (INDEPENDENT_AMBULATORY_CARE_PROVIDER_SITE_OTHER): Payer: Medicare Other

## 2021-02-17 DIAGNOSIS — G43709 Chronic migraine without aura, not intractable, without status migrainosus: Secondary | ICD-10-CM

## 2021-02-17 DIAGNOSIS — F331 Major depressive disorder, recurrent, moderate: Secondary | ICD-10-CM

## 2021-02-17 DIAGNOSIS — I700 Atherosclerosis of aorta: Secondary | ICD-10-CM

## 2021-02-17 DIAGNOSIS — J441 Chronic obstructive pulmonary disease with (acute) exacerbation: Secondary | ICD-10-CM

## 2021-02-17 DIAGNOSIS — E782 Mixed hyperlipidemia: Secondary | ICD-10-CM

## 2021-02-17 DIAGNOSIS — I10 Essential (primary) hypertension: Secondary | ICD-10-CM
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
## 2021-02-17 NOTE — Telephone Encounter (Signed)
The pt left a message on the billing line wanting to make a payment. I tried to call the pt back but was unable to speak with her. I left a message requesting for her to call the office back. Waiting for the pt to call the office back.

## 2021-02-21 LAB — URINE CULTURE

## 2021-02-22 ENCOUNTER — Other Ambulatory Visit: Payer: Self-pay | Admitting: Nurse Practitioner

## 2021-02-22 DIAGNOSIS — N3000 Acute cystitis without hematuria: Secondary | ICD-10-CM

## 2021-02-22 MED ORDER — AMOXICILLIN-POT CLAVULANATE 875-125 MG PO TABS: 1.0000 | ORAL_TABLET | Freq: Two times a day (BID) | ORAL | 0 refills | Status: DC

## 2021-03-02 ENCOUNTER — Telehealth: Payer: Self-pay

## 2021-03-02 NOTE — Telephone Encounter (Signed)
Called pt with CT head results. She is questioning if there is anything that can be done for the short term memory loss and migraines. The short term memory loss has been an irritation for pt. She has been having migraines atleast 3 times a week. They can last a couple hours to half a day. She has tried her hydrocodone and her sumatriptan, it can be helpful sometimes but other times gives no relief. She questioned if there was more she could do.   Anne [name] 03/02/21 9:32 AM

## 2021-03-02 NOTE — Telephone Encounter (Signed)
Pt agreed to return to Dr Posey Pronto. She will also be seeing Dr Glynda Jaeger on Tuesday. Made her aware their office has been trying to contact her. Advised she reach out to their office, she VU.   Royce Macadamia, [place] 03/02/21 5:10 PM

## 2021-03-04 DIAGNOSIS — I10 Essential (primary) hypertension: Secondary | ICD-10-CM | POA: Diagnosis not present

## 2021-03-04 DIAGNOSIS — J449 Chronic obstructive pulmonary disease, unspecified: Secondary | ICD-10-CM

## 2021-03-04 DIAGNOSIS — E785 Hyperlipidemia, unspecified: Secondary | ICD-10-CM | POA: Diagnosis not present

## 2021-03-04 DIAGNOSIS — F32A Depression, unspecified: Secondary | ICD-10-CM

## 2021-03-07 DIAGNOSIS — R220 Localized swelling, mass and lump, head: Secondary | ICD-10-CM | POA: Diagnosis not present

## 2021-03-08 ENCOUNTER — Ambulatory Visit (INDEPENDENT_AMBULATORY_CARE_PROVIDER_SITE_OTHER): Payer: Medicare HMO | Admitting: Neurology

## 2021-03-08 ENCOUNTER — Other Ambulatory Visit: Payer: Self-pay

## 2021-03-08 ENCOUNTER — Encounter: Payer: Self-pay | Admitting: Neurology

## 2021-03-08 VITALS — BP 133/69 | HR 75 | Ht 60.0 in | Wt 96.0 lb

## 2021-03-08 DIAGNOSIS — Z818 Family history of other mental and behavioral disorders: Secondary | ICD-10-CM

## 2021-03-08 DIAGNOSIS — R410 Disorientation, unspecified: Secondary | ICD-10-CM

## 2021-03-08 DIAGNOSIS — I6522 Occlusion and stenosis of left carotid artery: Secondary | ICD-10-CM

## 2021-03-08 DIAGNOSIS — R296 Repeated falls: Secondary | ICD-10-CM | POA: Diagnosis not present

## 2021-03-08 DIAGNOSIS — H539 Unspecified visual disturbance: Secondary | ICD-10-CM

## 2021-03-08 DIAGNOSIS — R413 Other amnesia: Secondary | ICD-10-CM | POA: Diagnosis not present

## 2021-03-08 DIAGNOSIS — I779 Disorder of arteries and arterioles, unspecified: Secondary | ICD-10-CM

## 2021-03-08 DIAGNOSIS — R531 Weakness: Secondary | ICD-10-CM

## 2021-03-08 NOTE — Patient Instructions (Addendum)
Start folic acid 1mg  daily  Start aspirin 81mg  daily and take your cholesterol medication  US carotids  Cardiac event monitor  Start physical therapy for balance  Neurocognitive testing   You have been referred for a neurocognitive evaluation (i.e., evaluation of memory and thinking abilities). Please bring someone with you to this appointment if possible, as it is helpful for the neuropsychologist to hear from both you and another adult who knows you well. Please bring eyeglasses and hearing aids if you wear them and take any medications as you normally would.    The evaluation will take approximately 2-3 hours and has two parts:   The first part is a clinical interview with the neuropsychologist, Dr. Melvyn Novas.  During the interview, the neuropsychologist will speak with you and the individual you brought to the appointment.    The second part of the evaluation is testing with the doctor's technician, aka psychometrician, Hinton Dyer or [employer]. During the testing, the technician will ask you to remember different types of material, solve problems, and answer some questionnaires. Your family member will not be present for this portion of the evaluation.   Please note: We have to reserve several hours of the neuropsychologist's time and the psychometrician's time for your evaluation appointment. As such, there is a No-Show fee of $100. If you are unable to attend any of your appointments, please contact our office as soon as possible to reschedule.
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
## 2021-03-09 ENCOUNTER — Telehealth: Payer: Self-pay

## 2021-03-09 NOTE — Telephone Encounter (Signed)
Attempted to call pt to make an appointment per [facility] for "discuss some recommendations made by pharmacy". No answer, left VM for return call.   Anne [name], [place] 03/09/21 10:36 AM

## 2021-03-09 NOTE — Telephone Encounter (Signed)
Appointment made.  Royce Macadamia, [place] 03/09/21 10:39 AM

## 2021-03-09 NOTE — Telephone Encounter (Signed)
-----   Message from [name], MD sent at 03/03/2021  6:35 PM EST ----- Regarding: appt Please ask pt to make an appt to discuss some recommendations made by pharmacy. kc

## 2021-03-14 ENCOUNTER — Telehealth: Payer: Self-pay

## 2021-03-14 NOTE — Chronic Care Management (AMB) (Signed)
° ° °  Called [name], No answer, left message of appointment on 03-21-2021 at 3:00 via telephone visit with Arizona Constable, Pharm D. Notified to have all medications, supplements, blood pressure and/or blood sugar logs available during appointment and to return call if need to reschedule.  Care Gaps: Hep  C overdue Shingrix overdue Flu vaccine overdue  Star Rating Drug: Atorvastatin 20 mg- Last filled 08-05-2019 90 DS   Any gaps in medications fill history? Yes  [place] Pharmacist Assistant [phone]

## 2021-03-21 ENCOUNTER — Ambulatory Visit (INDEPENDENT_AMBULATORY_CARE_PROVIDER_SITE_OTHER): Payer: Medicare HMO

## 2021-03-21 ENCOUNTER — Telehealth: Payer: Self-pay

## 2021-03-21 ENCOUNTER — Other Ambulatory Visit: Payer: Self-pay

## 2021-03-21 DIAGNOSIS — J441 Chronic obstructive pulmonary disease with (acute) exacerbation: Secondary | ICD-10-CM

## 2021-03-21 DIAGNOSIS — G43709 Chronic migraine without aura, not intractable, without status migrainosus: Secondary | ICD-10-CM

## 2021-03-21 DIAGNOSIS — L720 Epidermal cyst: Secondary | ICD-10-CM

## 2021-03-21 DIAGNOSIS — I10 Essential (primary) hypertension: Secondary | ICD-10-CM

## 2021-03-21 HISTORY — DX: Epidermal cyst: L72.0

## 2021-03-21 NOTE — Telephone Encounter (Signed)
Aniko called with questions about her multiple referrals.  I called back and left a message and tried to answer about referral to Dr. Noberto Retort, Dr. Posey Pronto and PT referral.
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
## 2021-03-21 NOTE — Assessment & Plan Note (Signed)
On forehead.  I reassured pt, but she would like to have excised.
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
## 2021-03-22 ENCOUNTER — Other Ambulatory Visit: Payer: Self-pay | Admitting: Family Medicine

## 2021-03-23 ENCOUNTER — Other Ambulatory Visit: Payer: Self-pay

## 2021-03-23 ENCOUNTER — Ambulatory Visit
Admission: RE | Admit: 2021-03-23 | Discharge: 2021-03-23 | Disposition: A | Discharge status: Home / Self Care | Payer: Medicare HMO | Source: Ambulatory Visit | Attending: Neurology | Admitting: Neurology

## 2021-03-23 DIAGNOSIS — I779 Disorder of arteries and arterioles, unspecified: Secondary | ICD-10-CM

## 2021-03-23 DIAGNOSIS — I6523 Occlusion and stenosis of bilateral carotid arteries: Secondary | ICD-10-CM | POA: Diagnosis not present

## 2021-03-27 ENCOUNTER — Other Ambulatory Visit: Payer: Self-pay | Admitting: Family Medicine

## 2021-03-27 DIAGNOSIS — D210 Benign neoplasm of connective and other soft tissue of head, face and neck: Secondary | ICD-10-CM | POA: Diagnosis not present

## 2021-03-27 DIAGNOSIS — R220 Localized swelling, mass and lump, head: Secondary | ICD-10-CM | POA: Diagnosis not present

## 2021-03-27 NOTE — Therapy (Incomplete)
OUTPATIENT PHYSICAL THERAPY LOWER EXTREMITY EVALUATION   Patient Name: Anne [name] MRN: [id number] DOB:1945/03/23, 76 y.o., female Today's Date: 03/27/2021    Past Medical History:  Diagnosis Date   Abnormal MRI of the head 02/08/2014   Formatting of this note might be different from the original. History of abn MRI brain in 2015 due to concussion. Formatting of this note might be different from the original. Overview:  History of abn MRI brain in 2015 due to concussion.   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 Niece at 39; dx. Maternal aunt in her late 16s  Formatting of this note might be different from the original. Overview:  Dx. Niece at 59; dx. Maternal aunt in her late 53s   Family history of colon cancer 04/26/2015   Dx. In maternal grandfather in his late 64s-70  Formatting of this note might be different from the original. Overview:  Dx. In maternal grandfather in his late 71s-70   Genetic testing 06/02/2015   Negative for pathogenic mutations within any of 20 genes on the Breast/Ovarian Cancer Panel through [employer].  No variants of uncertain significance (VUSes) were found.  The Breast/Ovarian Cancer Panel offered by [facility] Hope Pigeon, MD) includes sequencing and deletion/duplication analysis for the following 19 genes:  ATM, BARD1, BRCA1, BRCA2, BRIP1, CDH1, CHEK2, FANCC, M   History of basal cell carcinoma 04/26/2015   Dx. 68  Formatting of this note might be different from the original. Overview:  Dx. 68   History of concussion 01/01/2014   History of kidney stones    History of TIA (transient ischemic attack)  10/2014   Hyperlipidemia    Insomnia    Malrotation of intestine 09/23/2017   Migraine    Nasal congestion 05/26/2015   will finish z-pak 05/27/2015   Nonproductive cough 05/26/2015   PONV (postoperative nausea and vomiting)    "long time ago"  none recently   Psychogenic syncope 04/23/2019   RLS (restless legs syndrome)    Tobacco abuse    Vitamin D deficiency    Wears dentures    lower   Wears partial dentures    upper   Weight loss 08/28/2019   Past Surgical History:  Procedure Laterality Date   ABDOMINAL HYSTERECTOMY     APPENDECTOMY  2000   BREAST BIOPSY Left    BREAST RECONSTRUCTION WITH PLACEMENT OF TISSUE EXPANDER AND FLEX HD (ACELLULAR HYDRATED DERMIS) Bilateral 06/02/2015   Procedure: BILATERAL BREAST RECONSTRUCTION WITH PLACEMENT OF TISSUE EXPANDER AND  ACELLULAR HYDRATED DERMIS;  Surgeon: Irene Limbo, MD;  Location: [facility];  Service: Plastics;  Laterality: Bilateral;   CARPAL TUNNEL RELEASE Bilateral    CATARACT EXTRACTION Bilateral 09/2013   KNEE ARTHROSCOPY Left    LAPAROSCOPIC ABDOMINAL EXPLORATION N/A 10/2017   LAPAROSCOPIC LYSIS OF CONGENITAL ADHESIVE BAND/LADD'S BANDS WIH INTRAOPERATIVE CHOLANGIOGRAPHY. Perforation of small intestion requiring repair.    LOOP RECORDER INSERTION N/A 05/11/2016   Procedure: Loop Recorder Insertion;  Surgeon: Will Meredith Leeds, MD;  Location: [facility];  Service: Cardiovascular;  Laterality: N/A;   MASTECTOMY  W/ SENTINEL NODE BIOPSY Bilateral 06/02/2015   Procedure: BILATERAL MASTECTOMY WITH LEFT SENTINEL LYMPH NODE BIOPSY;  Surgeon: Stark Klein, MD;  Location: [facility];  Service: General;  Laterality: Bilateral;   NASAL SEPTUM SURGERY     x 3   PLANTAR FASCIA SURGERY Left    REMOVAL OF BILATERAL TISSUE EXPANDERS WITH PLACEMENT OF BILATERAL BREAST IMPLANTS Bilateral 06/17/2015   Procedure: DEBRIDEMENT OF BILATERAL MASTECTOMY FLAP WITH BILATERAL TISSUE EXPANDER EXCHANGE ;  Surgeon: Irene Limbo, MD;  Location: [facility];  Service: Plastics;  Laterality: Bilateral;   REMOVAL OF TISSUE EXPANDER Bilateral 07/12/2015   Procedure: REMOVAL OF BILATERAL TISSUE EXPANDERS;  Surgeon: Irene Limbo, MD;  Location: [facility];  Service: Plastics;  Laterality: Bilateral;   Patient Active Problem List   Diagnosis Date Noted   EIC (epidermal inclusion cyst) 03/21/2021   Acute migraine 01/30/2021   Aortic atherosclerosis ([place]) 01/30/2021   Bilateral carpal tunnel syndrome 01/30/2021   Chronic pain in right shoulder 12/25/2020   Chronic migraine without aura without status migrainosus, not intractable 12/25/2020   Memory loss 10/18/2020   Ataxia 10/18/2020   Frequent falls 10/18/2020   Tremor 10/18/2020   Moderate recurrent major depression ([place]) 10/18/2020   Lumbar back pain 10/18/2020   Simple chronic bronchitis ([place]) 07/04/2019   Right lumbar radiculopathy 04/23/2019   Asthma 09/13/2017   COPD mixed type ([place]) 07/03/2016   Hyperlipidemia 07/03/2016   Tobacco abuse    Sleep disorder 07/02/2016   Carotid artery disease ([place]) 05/04/2016   Migraine aura without headache 02/08/2014    PCP: [name], MD  REFERRING PROVIDER: Alda Berthold, DO  REFERRING DIAG: Falls frequently  THERAPY DIAG:  No diagnosis found.  ONSET DATE: ongoing 1+ years   SUBJECTIVE:  SUBJECTIVE STATEMENT: ***  PERTINENT HISTORY: Frequent falls, history of left breast cancer s/p double mastectomy (2017), depression/anxiety, COPD with asthma, and migraine   PAIN:  Are you having pain? {yes/no:20286} NPRS scale: ***/10 Pain location: *** Pain orientation: {Pain Orientation:25161}  PAIN TYPE: {type:313116} Pain description: {PAIN DESCRIPTION:21022940}  Aggravating factors: *** Relieving factors: ***  PRECAUTIONS: Fall  WEIGHT BEARING RESTRICTIONS No  FALLS:  Has patient fallen in last 6 months? Yes, Number of falls: 6  LIVING ENVIRONMENT: Lives with: {[facility] lives  with:25569::"lives with their family"} Lives in: {Lives in:25570} Stairs: {yes/no:20286}; {Stairs:24000} Has following equipment at home: {Assistive devices:23999}  OCCUPATION: ***  PLOF: Independent  PATIENT GOALS: ***   OBJECTIVE:  DIAGNOSTIC FINDINGS: ***  PATIENT SURVEYS:  {rehab surveys:24030}  COGNITION: Overall cognitive status: Within functional limits for tasks assessed     SENSATION:  Light touch: Appears intact  MUSCLE LENGTH: ***  POSTURE:  ***  PALPATION: ***  LE AROM/PROM:  A/PROM Right 03/27/2021 Left 03/27/2021  Hip flexion    Hip extension    Hip abduction    Knee flexion    Knee extension    Ankle dorsiflexion    Ankle plantarflexion    Ankle inversion    Ankle eversion     LE MMT:  MMT Right 03/27/2021 Left 03/27/2021  Hip flexion    Hip extension    Hip abduction    Hip adduction    Knee flexion    Knee extension    Ankle dorsiflexion    Ankle plantarflexion    Ankle inversion    Ankle eversion     LOWER EXTREMITY SPECIAL TESTS:  {LEspecialtests:26242}  FUNCTIONAL TESTS:  {Functional tests:24029}  GAIT: Distance walked: ***  Assistive device utilized: {Assistive devices:23999} Level of assistance: {Levels of assistance:24026} Comments: ***   TODAY'S TREATMENT: ***   PATIENT EDUCATION:  Education details: Exam findings, POC, HEP Person educated: Patient Education method: Explanation, Demonstration, Tactile cues, Verbal cues, and Handouts Education comprehension: verbalized understanding, returned demonstration, verbal cues required, tactile cues required, and needs further education  HOME EXERCISE PROGRAM: ***   ASSESSMENT: CLINICAL IMPRESSION: Patient is a 76 y.o. female who was seen today for physical therapy evaluation and treatment for ***. Objective impairments include {opptimpairments:25111}. These impairments are limiting patient from {activity limitations:25113}. Personal factors including {Personal  factors:25162} are also affecting patient's functional outcome. Patient will benefit from skilled PT to address above impairments and improve overall function.  REHAB POTENTIAL: {rehabpotential:25112}  CLINICAL DECISION MAKING: {clinical decision making:25114}  EVALUATION COMPLEXITY: {Evaluation complexity:25115}   GOALS: Goals reviewed with patient? Yes  SHORT TERM GOALS:  STG Name Target Date Goal status  1 Patient will be I with initial HEP in order to progress with therapy. Baseline:  {follow up:25551} INITIAL  2 PT will review FOTO with patient by 3rd visit in order to understand expected progress and outcome with therapy. Baseline:  {follow up:25551} INITIAL  3 *** Baseline: {follow up:25551} {GOALSTATUS:25110}   LONG TERM GOALS:   LTG Name Target Date Goal status  1 Patient will be I with final HEP to maintain progress from PT. Baseline: {follow up:25551} INITIAL  2 Patient will report >/= ***% status on FOTO to indicate improved functional ability. Baseline: {follow up:25551} INITIAL  3 *** Baseline: {follow up:25551} {GOALSTATUS:25110}  4 *** Baseline: {follow up:25551} {GOALSTATUS:25110}  5 *** Baseline: {follow up:25551} {GOALSTATUS:25110}    PLAN: PT FREQUENCY: {rehab frequency:25116}  PT DURATION: {rehab duration:25117}  PLANNED INTERVENTIONS: {rehab planned interventions:25118::"Therapeutic exercises","Therapeutic activity","Neuro Muscular re-education","Balance training","Gait training","Patient/Family education","Joint mobilization"}  PLAN FOR NEXT SESSION: Review HEP and progress PRN, ***   Hilda Blades, PT, DPT, LAT, ATC 03/27/21  4:28 PM Phone: [phone] Fax: [phone]

## 2021-03-28 ENCOUNTER — Ambulatory Visit: Payer: Medicare HMO | Admitting: Physical Therapy

## 2021-03-29 ENCOUNTER — Ambulatory Visit: Payer: Medicare HMO | Admitting: Physician Assistant

## 2021-03-30 ENCOUNTER — Ambulatory Visit (INDEPENDENT_AMBULATORY_CARE_PROVIDER_SITE_OTHER): Payer: Medicare HMO | Admitting: Family Medicine

## 2021-03-30 DIAGNOSIS — R052 Subacute cough: Secondary | ICD-10-CM

## 2021-03-30 NOTE — Progress Notes (Signed)
Patient left without being seen.

## 2021-03-31 DIAGNOSIS — J449 Chronic obstructive pulmonary disease, unspecified: Secondary | ICD-10-CM | POA: Diagnosis not present

## 2021-03-31 DIAGNOSIS — R051 Acute cough: Secondary | ICD-10-CM | POA: Diagnosis not present

## 2021-03-31 DIAGNOSIS — J029 Acute pharyngitis, unspecified: Secondary | ICD-10-CM | POA: Diagnosis not present

## 2021-03-31 DIAGNOSIS — J069 Acute upper respiratory infection, unspecified: Secondary | ICD-10-CM | POA: Diagnosis not present

## 2021-04-03 ENCOUNTER — Other Ambulatory Visit: Payer: Self-pay

## 2021-04-03 ENCOUNTER — Ambulatory Visit (INDEPENDENT_AMBULATORY_CARE_PROVIDER_SITE_OTHER): Payer: Medicare HMO | Admitting: Neurology

## 2021-04-03 DIAGNOSIS — R413 Other amnesia: Secondary | ICD-10-CM

## 2021-04-03 NOTE — Therapy (Signed)
OUTPATIENT PHYSICAL THERAPY EVALUATION   Patient Name: Anne [name] MRN: [id number] DOB:12/28/1945, 76 y.o., female Today's Date: 04/04/2021   PT End of Session - 04/04/21 1225     Visit Number 1    Number of Visits 6    Date for PT Re-Evaluation 05/16/21    Authorization Type [employer] MCR    PT Start Time 1215    PT Stop Time 1300    PT Time Calculation (min) 45 min    Activity Tolerance Patient tolerated treatment well    Behavior During Therapy [facility] for tasks assessed/performed             Past Medical History:  Diagnosis Date   Abnormal MRI of the head 02/08/2014   Formatting of this note might be different from the original. History of abn MRI brain in 2015 due to concussion. Formatting of this note might be different from the original. Overview:  History of abn MRI brain in 2015 due to concussion.   Acquired absence of both breasts and nipples 06/08/2015   Asthma    daily inhalers   Breast cancer ([place]) 05/2015   left   Breast cancer of upper-inner quadrant of left female breast ([place]) 04/15/2015   COPD (chronic obstructive pulmonary disease) ([place])    denies SOB with ADLs; no O2   Dental crowns present    Depression    Family history of breast cancer in female 04/26/2015   Dx. Niece at 8; dx. Maternal aunt in her late 41s  Formatting of this note might be different from the original. Overview:  Dx. Niece at 1; dx. Maternal aunt in her late 25s   Family history of colon cancer 04/26/2015   Dx. In maternal grandfather in his late 59s-70  Formatting of this note might be different from the original. Overview:  Dx. In maternal grandfather in his late 58s-70   Genetic testing 06/02/2015   Negative for pathogenic mutations within any of 20 genes on the Breast/Ovarian Cancer Panel through [employer].  No variants of uncertain significance (VUSes) were found.  The Breast/Ovarian Cancer Panel offered by [facility] Hope Pigeon, MD) includes sequencing and  deletion/duplication analysis for the following 19 genes:  ATM, BARD1, BRCA1, BRCA2, BRIP1, CDH1, CHEK2, FANCC, M   History of basal cell carcinoma 04/26/2015   Dx. 68  Formatting of this note might be different from the original. Overview:  Dx. 68   History of concussion 01/01/2014   History of kidney stones    History of TIA (transient ischemic attack) 10/2014   Hyperlipidemia    Insomnia    Malrotation of intestine 09/23/2017   Migraine    Nasal congestion 05/26/2015   will finish z-pak 05/27/2015   Nonproductive cough 05/26/2015   PONV (postoperative nausea and vomiting)    "long time ago"  none recently   Psychogenic syncope 04/23/2019   RLS (restless legs syndrome)    Tobacco abuse    Vitamin D deficiency    Wears dentures    lower   Wears partial dentures    upper   Weight loss 08/28/2019   Past Surgical History:  Procedure Laterality Date   ABDOMINAL HYSTERECTOMY     APPENDECTOMY  2000   BREAST BIOPSY Left    BREAST RECONSTRUCTION WITH PLACEMENT OF TISSUE EXPANDER AND FLEX HD (ACELLULAR HYDRATED DERMIS) Bilateral 06/02/2015   Procedure: BILATERAL BREAST RECONSTRUCTION WITH PLACEMENT OF TISSUE EXPANDER AND  ACELLULAR HYDRATED DERMIS;  Surgeon: Irene Limbo, MD;  Location: [facility];  Service: Plastics;  Laterality: Bilateral;   CARPAL TUNNEL RELEASE Bilateral    CATARACT EXTRACTION Bilateral 09/2013   KNEE ARTHROSCOPY Left    LAPAROSCOPIC ABDOMINAL EXPLORATION N/A 10/2017   LAPAROSCOPIC LYSIS OF CONGENITAL ADHESIVE BAND/LADD'S BANDS WIH INTRAOPERATIVE CHOLANGIOGRAPHY. Perforation of small intestion requiring repair.    LOOP RECORDER INSERTION N/A 05/11/2016   Procedure: Loop Recorder Insertion;  Surgeon: Will Meredith Leeds, MD;  Location: [facility];  Service: Cardiovascular;  Laterality: N/A;   MASTECTOMY W/ SENTINEL NODE BIOPSY Bilateral 06/02/2015   Procedure: BILATERAL MASTECTOMY WITH LEFT SENTINEL LYMPH NODE BIOPSY;  Surgeon: Stark Klein, MD;   Location: [facility];  Service: General;  Laterality: Bilateral;   NASAL SEPTUM SURGERY     x 3   PLANTAR FASCIA SURGERY Left    REMOVAL OF BILATERAL TISSUE EXPANDERS WITH PLACEMENT OF BILATERAL BREAST IMPLANTS Bilateral 06/17/2015   Procedure: DEBRIDEMENT OF BILATERAL MASTECTOMY FLAP WITH BILATERAL TISSUE EXPANDER EXCHANGE ;  Surgeon: Irene Limbo, MD;  Location: [facility];  Service: Plastics;  Laterality: Bilateral;   REMOVAL OF TISSUE EXPANDER Bilateral 07/12/2015   Procedure: REMOVAL OF BILATERAL TISSUE EXPANDERS;  Surgeon: Irene Limbo, MD;  Location: [facility];  Service: Plastics;  Laterality: Bilateral;   Patient Active Problem List   Diagnosis Date Noted   EIC (epidermal inclusion cyst) 03/21/2021   Acute migraine 01/30/2021   Aortic atherosclerosis ([place]) 01/30/2021   Bilateral carpal tunnel syndrome 01/30/2021   Chronic pain in right shoulder 12/25/2020   Chronic migraine without aura without status migrainosus, not intractable 12/25/2020   Memory loss 10/18/2020   Ataxia 10/18/2020   Frequent falls 10/18/2020   Tremor 10/18/2020   Moderate recurrent major depression ([place]) 10/18/2020   Lumbar back pain 10/18/2020   Simple chronic bronchitis ([place]) 07/04/2019   Right lumbar radiculopathy 04/23/2019   Asthma 09/13/2017   COPD mixed type ([place]) 07/03/2016   Hyperlipidemia 07/03/2016   Tobacco abuse    Sleep disorder 07/02/2016   Carotid artery disease ([place]) 05/04/2016   Migraine aura without headache 02/08/2014    PCP: [name], MD  REFERRING PROVIDER: Alda Berthold, DO  REFERRING DIAG: Falls frequently  THERAPY DIAG:  Repeated falls  Muscle weakness (generalized)  ONSET DATE: "within the past year"   SUBJECTIVE:  SUBJECTIVE STATEMENT: Patient states she falls frequently, a lot when getting up out of a chair and turning has been difficulty more recently. Patient reports recently it seems like the loss of balance has caused  her falls. Patient seems to be going to the left when she falls. She lives alone so has to get up herself. No falls in the community, all have been at home. One fall occurred when she tripped over a kitten, but the rest are typically just losing balance.  PERTINENT HISTORY: Frequent falls, history of left breast cancer s/p double mastectomy (2017), depression/anxiety, COPD with asthma, and migraine   PAIN:  Are you having pain? No NPRS scale: N/A Pain location: N/A Pain orientation: N/A PAIN TYPE: N/A Pain description: N/A Aggravating factors: N/A Relieving factors: N/A  PRECAUTIONS: Fall  WEIGHT BEARING RESTRICTIONS No  FALLS:  Has patient fallen in last 6 months? Yes, Number of falls: 6-7 (3 this weekend)  LIVING ENVIRONMENT: Lives with: lives with their family and lives alone Lives in: House/apartment Stairs: Yes; External: 3 steps; can reach both Has following equipment at home: Single point cane  OCCUPATION: Retired  PLOF: Independent  PATIENT GOALS: Improve balance,  fall less   OBJECTIVE:  DIAGNOSTIC FINDINGS: None  PATIENT SURVEYS:  FOTO 60% functional status (predicted 60%)  COGNITION: Overall cognitive status: Within functional limits for tasks assessed     SENSATION:  Light touch: Appears intact  MUSCLE LENGTH: Not assessed  POSTURE:  Rounded shoulder posture  PALPATION: Not assessed  LE AROM/PROM:   Grossly WFL and non-painful  LE MMT:  MMT Right 04/04/2021 Left 04/04/2021  Hip flexion 4- 4-  Hip extension 3 3  Hip abduction 3 3  Knee flexion 4 4  Knee extension 4+ 4+   FUNCTIONAL TESTS:  5 times sit to stand: 10 seconds Berg Balance Scale: 45  (see bottom of note) TUG: 9 seconds  GAIT: Assistive device utilized: None Level of assistance: Complete Independence Comments: Narrow stance, occasionally with unsteady gait especially with turns    TODAY'S TREATMENT: Sit to stand without UE support 2 x 10 Standing hip abduction with  red at knees x 10 each Tandem stance x 30 sec each Romberg stance with head turns x 10, nods x 10   PATIENT EDUCATION:  Education details: Exam findings, POC, HEP Person educated: Patient Education method: Explanation, Demonstration, Tactile cues, Verbal cues, and Handouts Education comprehension: verbalized understanding, returned demonstration, verbal cues required, tactile cues required, and needs further education  HOME EXERCISE PROGRAM: Access Code: [email or web address]   ASSESSMENT: CLINICAL IMPRESSION: Patient is a 76 y.o. female who was seen today for physical therapy evaluation and treatment for frequent falls and balance impairments. Patient seems to have most difficulty with narrow based of support, reaching outside base of support, and dynamic balance including turns. Objective impairments include decreased balance and decreased strength. These impairments are limiting patient from cleaning, community activity, occupation, laundry, yard work, and shopping. Personal factors including Age, Fitness, Past/current experiences, and Time since onset of injury/illness/exacerbation are also affecting patient's functional outcome. Patient will benefit from skilled PT to address above impairments and improve overall function.  REHAB POTENTIAL: Good  CLINICAL DECISION MAKING: Stable/uncomplicated  EVALUATION COMPLEXITY: Low   GOALS: Goals reviewed with patient? Yes  SHORT TERM GOALS:  STG Name Target Date Goal status  1 Patient will be I with initial HEP in order to progress with therapy. Baseline: provided at eval 04/25/2021 INITIAL   LONG TERM GOALS:   LTG Name Target Date Goal status  1 Patient will be I with final HEP to maintain progress from PT. Baseline: provided at eval 05/16/2021 INITIAL  2 Patient will demonstrate BERG balance scale >/= 51/56 to indicate improved balance and reduced fall risk Baseline: 45/56 05/16/2021 INITIAL  3 Patient will demonstrate improve hip strength  grossly >/= 4-/5 MMT in order to improve control and ability to correct balance Baseline: grossly 3/5 MMT 05/16/2021 INITIAL  4 Patient will report no limitation in activity level as a result of balance impairment or falls Baseline: patient currently with functional limitations as a result of balance impairment 05/16/2021 INITIAL    PLAN: PT FREQUENCY: 1x/week  PT DURATION: 6 weeks  PLANNED INTERVENTIONS: Therapeutic exercises, Therapeutic activity, Neuro Muscular re-education, Balance training, Gait training, Patient/Family education, Joint mobilization, Stair training, Aquatic Therapy, Dry Needling, and Manual therapy  PLAN FOR NEXT SESSION: Review HEP and progress PRN, continue LE strengthening and balance training, progress dynamic balance   Hilda Blades, PT, DPT, LAT, ATC 04/04/21  1:31 PM Phone: [phone] Fax: [phone]    BERG BALANCE Sitting to Standing: Numbers; 0-4: 4  4. Stands without using hands and stabilize independently  3.  Stands independently using hands  2. Stands using hands after multiple trials  1. Min A to stand  0. Mod-Max A to stand Standing unsupported: Numbers; 0-4: 4  4. Stands safely for 2 minutes  3. Stands 2 minutes with supervision  2. Stands 30 seconds unsupported  1. Needs several tries to stand unsupported for 30 seconds  0. Unable to stand unsupported for 30 seconds Sitting unsupported: Numbers; 0-4: 4  4. Sits for 2 minutes independently  3. Sits for 2 minutes with supervision  2. Able to sit 30 seconds  1. Able to sit 10 seconds  0. Unable to sit for 10 seconds Standing to Sitting: Numbers; 0-4: 4 4. Sits safely with minimal use of hands 3. Controls descent with hands 2. Uses back of legs against chair to control descent 1. Sits independently, but uncontrolled descent 0. Needs assistance Transfers: Numbers; 0-4: 4  4. Transfers safely with minor use of hands  3. Transfers safely definite use of hands  2. Transfers with  verbal cueing/supervision  1. Needs 1 person assist  0. Needs 2 person assist  Standing with eyes closed: Numbers; 0-4: 4  4. Stands safely for 10 seconds  3. Stands 10 seconds with supervision   2. Able to stand for 3 seconds  1. Unable to keep eyes closed for 3 seconds, but is safe  0. Needs assist to keep from falling Standing with feet together: Numbers; 0-4: 3 4. Stands for 1 minute safely 3. Stands for 1 minute with supervision 2. Unable to hold for 30 seconds  1. Needs help to attain position but can hold for 15 seconds  0. Needs help to attain position and unable to hold for 15 seconds Reaching forward with outstretched arm: Numbers; 0-4: 3  4. Reaches forward 10 inches  3. Reaches forward 5 inches  2. Reaches forward 2 inches  1. Reaches forward with supervision  0. Loses balance/requires assistace Retrieving object from the floor: Numbers; 0-4: 4 4. Able to pick up easily and safely 3. Able to pick up with supervision 2. Unable to pick up, but reaches within 1-2 inches independently 1. Unable to pick up and needs supervision 0. Unable/needs assistance to keep from falling  Turning to look behind: Numbers; 0-4: 4  4. Looks behind from both sides and weight shifts well  3. Looks behind one side only, other side less weight shift  2. Turns sideways only, maintains balance  1. Needs supervision when turning  0. Needs assistance  Turning 360 degrees: Numbers; 0-4: 4  4. Able to turn in </=4 seconds  3. Able to turn on one side in </= 4 seconds   2. Able to turn slowly, but safely  1. Needs supervision or verbal cueing  0. Needs assistance Place alternate foot on stool: Numbers; 0-4: 3 4. Completes 8 steps in 20 seconds 3. Completes 8 steps in >20 seconds 2. 4 steps without assistance/supervision 1. Completes >2 steps with minimal assist 0. Unable, needs assist to keep from falling Standing with one foot in front: Numbers; 0-4: 0  4. Independent tandem for 30  seconds  3. Independent foot ahead for 30 seconds  2. Independent small step for 30 seconds  1. Needs help to step, but can hold for 15 seconds  0. Loses balance while standing/stepping Standing on one foot: Numbers; 0-4: 0 4. Holds >10 seconds 3. Holds 5-10 seconds 2. Holds >/=3 seconds  1. Holds <3 seconds 0. Unable   Total Score: 45/56

## 2021-04-04 ENCOUNTER — Encounter: Payer: Self-pay | Admitting: Physical Therapy

## 2021-04-04 ENCOUNTER — Ambulatory Visit: Payer: Medicare HMO | Attending: Neurology | Admitting: Physical Therapy

## 2021-04-04 ENCOUNTER — Other Ambulatory Visit: Payer: Self-pay

## 2021-04-04 DIAGNOSIS — J441 Chronic obstructive pulmonary disease with (acute) exacerbation: Secondary | ICD-10-CM

## 2021-04-04 DIAGNOSIS — R296 Repeated falls: Secondary | ICD-10-CM

## 2021-04-04 DIAGNOSIS — I10 Essential (primary) hypertension: Secondary | ICD-10-CM | POA: Diagnosis not present

## 2021-04-04 DIAGNOSIS — M6281 Muscle weakness (generalized): Secondary | ICD-10-CM

## 2021-04-04 NOTE — Patient Instructions (Signed)
Access Code: [email or web address] URL: [email or web address] Date: 04/04/2021 Prepared by: Hilda Blades  Exercises Sit to Stand Without Arm Support - 7 x weekly - 3 sets - 10 reps Standing Hip Abduction with Resistance at Thighs - 7 x weekly - 3 sets - 10 reps Tandem Stance with Support - 7 x weekly - 3 sets - 30 seconds hold Romberg Stance with Head Rotation - 7 x weekly - 3 sets - 10 reps Romberg Stance with Head Nods - 7 x weekly - 3 sets - 10 reps

## 2021-04-05 ENCOUNTER — Telehealth: Payer: Self-pay | Admitting: Neurology

## 2021-04-05 NOTE — Procedures (Signed)
ELECTROENCEPHALOGRAM REPORT  Date of Study: 04/03/2021  Patient's Name: Anne [name] MRN: [id number] Date of Birth: 12-Apr-1945  Referring Provider: Dr. Narda Amber  Clinical History: This is a 75 year old woman with falls and confusion. EEG for classification.  Medications: VENTOLIN HFA 108 (90 Base) MC!G/ACT inhaler ELAVIL 75 MG tablet ABILIFY 15 MG tablet ZYRTEC 10 MG tablet LASIX 20 MG tablet NEURONTIN 300 MG capsule NORCO/VICODIN 5-325 MG tablet Melatonin 1 MG CAPS MOBIC 15 MG tablet REMERON 45 MG tablet PRILOSEC 40 MG capsule NORFLEX 100 MG tablet MYSOLINE 50 MG tablet INDERAL 20 MG tablet MAXALT-MLT 10 MG disintegrating tablet IMITREX 100 MG tablet DESYREL 100 MG tablet  Technical Summary: A multichannel digital EEG recording measured by the international 10-20 system with electrodes applied with paste and impedances below 5000 ohms performed in our laboratory with EKG monitoring in an awake and asleep patient.  Hyperventilation was not performed. Photic stimulation was performed.  The digital EEG was referentially recorded, reformatted, and digitally filtered in a variety of bipolar and referential montages for optimal display.    Description: The patient is awake and asleep during the recording.  During maximal wakefulness, there is a symmetric, medium voltage 8.5-9 Hz posterior dominant rhythm that attenuates with eye opening.  The record is symmetric.  During drowsiness and sleep, there is an increase in theta slowing of the background with shifting asymmetry over the bilateral temporal regions.  Vertex waves and symmetric sleep spindles were seen.  Photic stimulation did not elicit any abnormalities.  There were no epileptiform discharges or electrographic seizures seen.    EKG lead was unremarkable.  Impression: This awake and asleep EEG is within normal limits for age.  Clinical Correlation: A normal EEG does not exclude a clinical diagnosis of epilepsy.  If  further clinical questions remain, prolonged EEG may be helpful.  Clinical correlation is advised.   Ellouise Newer, M.D.

## 2021-04-05 NOTE — Telephone Encounter (Signed)
Patient called for her results from her recent EEG and also a bilateral carotid artery test done mid-January.

## 2021-04-06 NOTE — Telephone Encounter (Signed)
Called patient and left a message for a call back.  

## 2021-04-06 NOTE — Telephone Encounter (Signed)
Called patient and unable to leave a message

## 2021-04-06 NOTE — Telephone Encounter (Signed)
Please let her know EEG is normal and US carotids is better than before, now only with mild narrowing. Thanks.

## 2021-04-06 NOTE — Telephone Encounter (Signed)
Patient returned call to Mahina. 

## 2021-04-06 NOTE — Telephone Encounter (Signed)
Pt is returning a call to mahina 

## 2021-04-07 NOTE — Telephone Encounter (Signed)
Called patient and call was forwarded to voicemail. Left a detailed message per DPR of EEG and US carotid u/s results. Informed patient that if she had any questions or concerns to give me a call back.

## 2021-04-10 ENCOUNTER — Encounter: Payer: Self-pay | Admitting: Family Medicine

## 2021-04-10 ENCOUNTER — Ambulatory Visit (INDEPENDENT_AMBULATORY_CARE_PROVIDER_SITE_OTHER): Payer: Medicare HMO | Admitting: Family Medicine

## 2021-04-10 ENCOUNTER — Other Ambulatory Visit: Payer: Self-pay

## 2021-04-10 VITALS — BP 110/50 | HR 104 | Temp 97.1°F | Resp 18 | Ht 60.0 in | Wt 91.4 lb

## 2021-04-10 DIAGNOSIS — R251 Tremor, unspecified: Secondary | ICD-10-CM | POA: Diagnosis not present

## 2021-04-10 DIAGNOSIS — M5416 Radiculopathy, lumbar region: Secondary | ICD-10-CM | POA: Diagnosis not present

## 2021-04-10 DIAGNOSIS — Z720 Tobacco use: Secondary | ICD-10-CM | POA: Diagnosis not present

## 2021-04-10 DIAGNOSIS — K5903 Drug induced constipation: Secondary | ICD-10-CM

## 2021-04-10 DIAGNOSIS — J410 Simple chronic bronchitis: Secondary | ICD-10-CM | POA: Diagnosis not present

## 2021-04-10 DIAGNOSIS — G43719 Chronic migraine without aura, intractable, without status migrainosus: Secondary | ICD-10-CM

## 2021-04-10 HISTORY — DX: Drug induced constipation: K59.03

## 2021-04-10 MED ORDER — TRELEGY ELLIPTA 200-62.5-25 MCG/ACT IN AEPB: 1.0000 | INHALATION_SPRAY | Freq: Every day | RESPIRATORY_TRACT | 3 refills | Status: DC

## 2021-04-10 MED ORDER — TRULANCE 3 MG PO TABS: 1.0000 | ORAL_TABLET | Freq: Every day | ORAL | 0 refills | Status: DC

## 2021-04-10 MED ORDER — IPRATROPIUM-ALBUTEROL 0.5-2.5 (3) MG/3ML IN SOLN
3.0000 mL | Freq: Once | RESPIRATORY_TRACT | Status: AC
  Administered 2021-04-10: 3 mL via RESPIRATORY_TRACT

## 2021-04-10 MED ORDER — QULIPTA 60 MG PO TABS: 60.0000 mg | ORAL_TABLET | Freq: Every day | ORAL | 0 refills | Status: DC

## 2021-04-10 MED ORDER — QULIPTA 60 MG PO TABS: 60.0000 mg | ORAL_TABLET | Freq: Every day | ORAL | 3 refills | Status: DC

## 2021-04-10 MED ORDER — TRULANCE 3 MG PO TABS: 1.0000 | ORAL_TABLET | Freq: Every day | ORAL | 3 refills | Status: DC

## 2021-04-10 NOTE — Assessment & Plan Note (Signed)
Continue propranolol 20 mg twice daily

## 2021-04-10 NOTE — Assessment & Plan Note (Signed)
Start on trulance 3 mg daily.

## 2021-04-10 NOTE — Patient Instructions (Addendum)
Migraines: Take Qlipta 60 mg daily for migraine prevention. Continue imitrex for acute migraine treatment Call neurology for follow up.  Back pain: Referring back to Dr. Donivan Scull.  Constipation: Start on trulance 3 mg daily.  COPD: Restart trelegy one inhalation daily.  Rinse mouth out after use.

## 2021-04-10 NOTE — Assessment & Plan Note (Addendum)
Refer back to Dr. Donivan Scull. If Dr. Donivan Scull feels she can return to other job at [employer], he may release her.  I also encouraged patient to discuss with her boss at [employer].

## 2021-04-10 NOTE — Assessment & Plan Note (Addendum)
Start on trelegy one inhalation daily.  Not at goal.  Had a coughing fit.  Duoneb given.

## 2021-04-10 NOTE — Progress Notes (Signed)
Subjective:  Patient ID: Anne [name], female    DOB: 03/08/1945  Age: 76 y.o. MRN: [id number]  Chief Complaint  Patient presents with   frequent falls   COPD   HPI COPD: breathing has improved. Smoking 1/2 ppd.  Not taking any inhaler regularly. Has albuterol. She thinks she may have trelegy.   Hands jump involuntarily and she drops things. She has had BL carpal tunnel syndrome. She has a tremor and hands are jumping when texting. Loses grip on both hands. Right hand dominant.  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 [employer] did not want her lifting patients. Patient misses speaking with residents. Wishes to return to more direct care. She is requesting I intervene  Migraines: failed on topamax. On amitriptyline currently and propranol. Continues to have 4 + migraines per week. Takes imitrex which helps. Has maxalt also, which does not help. Tried emgallity sample which helped, but was too expensive. Given samples of ubrelvy which helped some.    Constipation: secondary to amitriptyline and hydrocodone. Linzess caused severe diarrhea. Has tried dulcolax. Patient has hard stools about every 4 days.   Current Outpatient Medications on File Prior to Visit  Medication Sig Dispense Refill   albuterol (VENTOLIN HFA) 108 (90 Base) MCG/ACT inhaler INHALE 1 TO 2 PUFFS BY MOUTH EVERY 6 HOURS AS NEEDED FOR WHEEZING FOR SHORTNESS OF BREATH 18 g 12   amitriptyline (ELAVIL) 75 MG tablet TAKE 1 TABLET (75 MG TOTAL) BY MOUTH DAILY. 90 tablet 0   ARIPiprazole (ABILIFY) 15 MG tablet Take 1 tablet (15 mg total) by mouth daily. 30 tablet 0   cetirizine (ZYRTEC) 10 MG tablet Take 1 tablet (10 mg total) by mouth daily. 30 tablet 11   doxycycline (VIBRA-TABS) 100 MG tablet Take 100 mg by mouth 2 (two) times daily.     furosemide (LASIX) 20 MG tablet Take 1 tablet (20 mg total) by mouth daily as needed for edema  (for leg swelling). 90 tablet 1   gabapentin (NEURONTIN) 300 MG capsule Take 2 capsules (600 mg total) by mouth 3 (three) times daily. 180 capsule 1   HYDROcodone-acetaminophen (NORCO/VICODIN) 5-325 MG tablet Take 1 tablet by mouth daily as needed for severe pain (migraines). 30 tablet 0   Melatonin 1 MG CAPS Take by mouth.     meloxicam (MOBIC) 15 MG tablet TAKE 1 TABLET EVERY DAY 90 tablet 1   mirtazapine (REMERON) 45 MG tablet TAKE 1 TABLET (45 MG TOTAL) BY MOUTH AT BEDTIME. 90 tablet 1   omeprazole (PRILOSEC) 40 MG capsule TAKE 1 CAPSULE TWICE DAILY 180 capsule 1   orphenadrine (NORFLEX) 100 MG tablet TAKE 1 TABLET (100 MG TOTAL) BY MOUTH 2 (TWO) TIMES DAILY AS NEEDED FOR MUSCLE SPASMS. 180 tablet 0   primidone (MYSOLINE) 50 MG tablet TAKE 1 TABLET BY MOUTH IN THE MORNING AND TAKE 1 TABLET AT BEDTIME 180 tablet 0   propranolol (INDERAL) 20 MG tablet Take 1 tablet (20 mg total) by mouth 2 (two) times daily. 180 tablet 1   rizatriptan (MAXALT-MLT) 10 MG disintegrating tablet      SUMAtriptan (IMITREX) 100 MG tablet May repeat once in 2 hours, if headache persists or recurs. No more than 2 in 24 hours. 10 tablet 3   traZODone (DESYREL) 100 MG tablet TAKE 1 TABLET (100 MG TOTAL) BY MOUTH AT BEDTIME AS NEEDED FOR SLEEP. 90 tablet 1   No current  facility-administered medications on file prior to visit.   Past Medical History:  Diagnosis Date   Abnormal MRI of the head 02/08/2014   Formatting of this note might be different from the original. History of abn MRI brain in 2015 due to concussion. Formatting of this note might be different from the original. Overview:  History of abn MRI brain in 2015 due to concussion.   Acquired absence of both breasts and nipples 06/08/2015   Asthma    daily inhalers   Breast cancer ([place]) 05/2015   left   Breast cancer of upper-inner quadrant of left female breast ([place]) 04/15/2015   COPD (chronic obstructive pulmonary disease) ([place])    denies SOB with ADLs; no O2    Dental crowns present    Depression    Family history of breast cancer in female 04/26/2015   Dx. Niece at 40; dx. Maternal aunt in her late 21s  Formatting of this note might be different from the original. Overview:  Dx. Niece at 98; dx. Maternal aunt in her late 53s   Family history of colon cancer 04/26/2015   Dx. In maternal grandfather in his late 93s-70  Formatting of this note might be different from the original. Overview:  Dx. In maternal grandfather in his late 53s-70   Genetic testing 06/02/2015   Negative for pathogenic mutations within any of 20 genes on the Breast/Ovarian Cancer Panel through [facility].  No variants of uncertain significance (VUSes) were found.  The Breast/Ovarian Cancer Panel offered by [facility] Hope Pigeon, MD) includes sequencing and deletion/duplication analysis for the following 19 genes:  ATM, BARD1, BRCA1, BRCA2, BRIP1, CDH1, CHEK2, FANCC, M   History of basal cell carcinoma 04/26/2015   Dx. 67  Formatting of this note might be different from the original. Overview:  Dx. 67   History of concussion 01/01/2014   History of kidney stones    History of TIA (transient ischemic attack) 10/2014   Hyperlipidemia    Insomnia    Malrotation of intestine 09/23/2017   Migraine    Nasal congestion 05/26/2015   will finish z-pak 05/27/2015   Nonproductive cough 05/26/2015   PONV (postoperative nausea and vomiting)    "long time ago"  none recently   Psychogenic syncope 04/23/2019   RLS (restless legs syndrome)    Tobacco abuse    Vitamin D deficiency    Wears dentures    lower   Wears partial dentures    upper   Weight loss 08/28/2019   Past Surgical History:  Procedure Laterality Date   ABDOMINAL HYSTERECTOMY     APPENDECTOMY  2000   BREAST BIOPSY Left    BREAST RECONSTRUCTION WITH PLACEMENT OF TISSUE EXPANDER AND FLEX HD (ACELLULAR HYDRATED DERMIS) Bilateral 06/02/2015   Procedure: BILATERAL BREAST RECONSTRUCTION WITH PLACEMENT OF TISSUE  EXPANDER AND  ACELLULAR HYDRATED DERMIS;  Surgeon: Irene Limbo, MD;  Location: [place];  Service: Plastics;  Laterality: Bilateral;   CARPAL TUNNEL RELEASE Bilateral    CATARACT EXTRACTION Bilateral 09/2013   KNEE ARTHROSCOPY Left    LAPAROSCOPIC ABDOMINAL EXPLORATION N/A 10/2017   LAPAROSCOPIC LYSIS OF CONGENITAL ADHESIVE BAND/LADD'S BANDS WIH INTRAOPERATIVE CHOLANGIOGRAPHY. Perforation of small intestion requiring repair.    LOOP RECORDER INSERTION N/A 05/11/2016   Procedure: Loop Recorder Insertion;  Surgeon: Will Meredith Leeds, MD;  Location: [place] [facility];  Service: Cardiovascular;  Laterality: N/A;   MASTECTOMY W/ SENTINEL NODE BIOPSY Bilateral 06/02/2015   Procedure: BILATERAL MASTECTOMY WITH LEFT SENTINEL LYMPH  NODE BIOPSY;  Surgeon: Stark Klein, MD;  Location: [place];  Service: General;  Laterality: Bilateral;   NASAL SEPTUM SURGERY     x 3   PLANTAR FASCIA SURGERY Left    REMOVAL OF BILATERAL TISSUE EXPANDERS WITH PLACEMENT OF BILATERAL BREAST IMPLANTS Bilateral 06/17/2015   Procedure: DEBRIDEMENT OF BILATERAL MASTECTOMY FLAP WITH BILATERAL TISSUE EXPANDER EXCHANGE ;  Surgeon: Irene Limbo, MD;  Location: [place];  Service: Plastics;  Laterality: Bilateral;   REMOVAL OF TISSUE EXPANDER Bilateral 07/12/2015   Procedure: REMOVAL OF BILATERAL TISSUE EXPANDERS;  Surgeon: Irene Limbo, MD;  Location: [place];  Service: Plastics;  Laterality: Bilateral;    Family History  Problem Relation Age of Onset   Cervical cancer Mother 76   Colon cancer Maternal Grandfather        mets to stomach; dx. 67-70   Heart disease Father    Prostate cancer Father 67   Cervical cancer Maternal Grandmother        dx. 32s; treated with radium implant   Lung cancer Sister 60       maternal half-sister dx. lung cancer, stage III   Breast cancer Maternal Aunt    Cervical cancer Sister 15       paternal half-sister; s/p TAH   Spina bifida  Grandchild    Brain cancer Grandchild        grandson dx. at 20 mos, treated at [facility] cancer Other 41       maternal half-sister's daughter   Cancer Maternal Aunt        d. 76; unspecified type - "began at back area and moved to vital organs"   Cancer Maternal Uncle         late 31s; unspecified type; "started at back and moved to vital organs"   Social History   Socioeconomic History   Marital status: Widowed    Spouse name: Not on file   Number of children: 1   Years of education: 29   Highest education level: Not on file  Occupational History    Comment: [employer], retired  Tobacco Use   Smoking status: Former    Packs/day: 0.25    Types: Cigarettes    Quit date: 03/21/2020    Years since quitting: 1.0   Smokeless tobacco: Never  Vaping Use   Vaping Use: Never used  Substance and Sexual Activity   Alcohol use: Yes    Alcohol/week: 0.0 standard drinks    Comment: rarely   Drug use: No   Sexual activity: Not on file  Other Topics Concern   Not on file  Social History Narrative   Lives alone, widow   Caffeine use- tea, 1 cup daily   Right Handed    Lives in a one story [place] home    Social Determinants of Health   Financial Resource Strain: Low Risk    Difficulty of Paying Living Expenses: Not hard at all  Food Insecurity: Not on file  Transportation Needs: No Transportation Needs   Lack of Transportation (Medical): No   Lack of Transportation (Non-Medical): No  Physical Activity: Not on file  Stress: Not on file  Social Connections: Not on file    Review of Systems  Constitutional:  Positive for fatigue and unexpected weight change. Negative for chills and fever.  HENT:  Positive for congestion, rhinorrhea, sore throat and tinnitus.   Respiratory:  Positive for cough and shortness of breath.  Cardiovascular:  Negative for chest pain.  Gastrointestinal:  Positive for constipation. Negative for abdominal pain, diarrhea, nausea and  vomiting.  Genitourinary:  Negative for dysuria and urgency.  Musculoskeletal:  Positive for back pain and myalgias.  Neurological:  Positive for tremors, weakness (hand grip) and headaches. Negative for dizziness and light-headedness.  Psychiatric/Behavioral:  Positive for dysphoric mood. The patient is not nervous/anxious.     Objective:  BP (!) 110/50    Pulse (!) 104    Temp (!) 97.1 F (36.2 C)    Resp 18    Ht 5' (1.524 m)    Wt 91 lb 6.4 oz (41.5 kg)    BMI 17.85 kg/m   BP/Weight 04/10/2021 03/08/2021 93/57/0177  Systolic BP 939 030 98  Diastolic BP 50 69 64  Wt. (Lbs) 91.4 96 98.2  BMI 17.85 18.75 17.4    Physical Exam Vitals reviewed.  Constitutional:      Appearance: Normal appearance.     Comments: thin  Neck:     Vascular: No carotid bruit.  Cardiovascular:     Rate and Rhythm: Normal rate and regular rhythm.     Heart sounds: Normal heart sounds.  Pulmonary:     Effort: Pulmonary effort is normal. No respiratory distress.     Breath sounds: Normal breath sounds.  Abdominal:     General: Abdomen is flat. Bowel sounds are normal.     Palpations: Abdomen is soft.     Tenderness: There is no abdominal tenderness.  Neurological:     Mental Status: She is alert and oriented to person, place, and time.     Comments: Tremor of hands.  Tinel's negative BL hands.  Phalen's negative BL hands.    Psychiatric:        Mood and Affect: Mood normal.        Behavior: Behavior normal.    Diabetic Foot Exam - Simple   No data filed      Lab Results  Component Value Date   WBC 7.9 02/14/2021   HGB 11.1 02/14/2021   HCT 33.8 (L) 02/14/2021   PLT 230 02/14/2021   GLUCOSE 82 02/14/2021   CHOL 132 01/30/2021   TRIG 133 01/30/2021   HDL 64 01/30/2021   LDLCALC 45 01/30/2021   ALT 26 02/14/2021   AST 25 02/14/2021   NA 141 02/14/2021   K 4.1 02/14/2021   CL 105 02/14/2021   CREATININE 0.74 02/14/2021   BUN 15 02/14/2021   CO2 27 02/14/2021   TSH 1.100 01/30/2021       Assessment & Plan:   Problem List Items Addressed This Visit       Cardiovascular and Mediastinum   Chronic migraine without aura, intractable, without status migrainosus    Start on qulipta 60 mg once daily.  Continue imitrex. Consider Roselyn Meier.       Relevant Medications   Atogepant ([name]) 60 MG TABS   Atogepant ([name]) 60 MG TABS     Respiratory   Simple chronic bronchitis (HCC)    Start on trelegy one inhalation daily.  Not at goal.  Had a coughing fit.  Duoneb given.         Digestive   Drug induced constipation    Start on trulance 3 mg daily.         Nervous and Auditory   Right lumbar radiculopathy - Primary    Refer back to Dr. Donivan Scull. If Dr. Donivan Scull feels she can return to other job at  [employer], he may release her.  I also encouraged patient to discuss with her boss at [employer].       Relevant Orders   Ambulatory referral to Orthopedic Surgery     Other   Tobacco abuse    Recommend cessation.       Tremor    Continue propranolol 20 mg twice daily      .  Meds ordered this encounter  Medications   Atogepant ([name]) 60 MG TABS    Sig: Take 60 mg by mouth daily.    Dispense:  12 tablet    Refill:  0   Plecanatide (TRULANCE) 3 MG TABS    Sig: Take 1 tablet by mouth daily.    Dispense:  12 tablet    Refill:  0   Fluticasone-Umeclidin-Vilant (TRELEGY ELLIPTA) 200-62.5-25 MCG/ACT AEPB    Sig: Inhale 1 puff into the lungs daily.    Dispense:  3 each    Refill:  3   Plecanatide (TRULANCE) 3 MG TABS    Sig: Take 1 tablet by mouth daily.    Dispense:  90 tablet    Refill:  3   Atogepant ([name]) 60 MG TABS    Sig: Take 60 mg by mouth daily.    Dispense:  90 tablet    Refill:  3   ipratropium-albuterol (DUONEB) 0.5-2.5 (3) MG/3ML nebulizer solution 3 mL    Orders Placed This Encounter  Procedures   Ambulatory referral to Orthopedic Surgery    Total time spent on today's visit was greater than 30 minutes, including  both face-to-face time and nonface-to-face time personally spent on review of chart (labs and imaging), discussing labs and goals, discussing further work-up, treatment options, referrals to specialist if needed, reviewing outside records of pertinent, answering patient's questions, and coordinating care.  Follow-up: Return in about 4 weeks (around 05/08/2021).  An After Visit Summary was printed and given to the patient.  [name], MD [facility] [phone]

## 2021-04-10 NOTE — Assessment & Plan Note (Signed)
Recommend cessation. ?

## 2021-04-10 NOTE — Assessment & Plan Note (Signed)
Start on qulipta 60 mg once daily.  Continue imitrex. Consider Roselyn Meier.

## 2021-04-11 ENCOUNTER — Ambulatory Visit: Payer: Medicare HMO

## 2021-04-18 ENCOUNTER — Telehealth: Payer: Self-pay

## 2021-04-18 DIAGNOSIS — R220 Localized swelling, mass and lump, head: Secondary | ICD-10-CM | POA: Diagnosis not present

## 2021-04-18 NOTE — Telephone Encounter (Signed)
Recommend patient come and pick up samples of aimovig 70 mg monthly. Patient will need to be shown how to do it. kc

## 2021-04-18 NOTE — Telephone Encounter (Signed)
PATIENT HAS BEEN DENIED FOR THE [name] PER INSURANCE AFTER A PRIOR AUTH WAS DONE. ON THE DENIAL IT STATED THE PREFERRED DRUG IS AIMOVIG AND EMGALITY. PLEASE ADVISE.

## 2021-04-19 ENCOUNTER — Ambulatory Visit: Payer: Medicare HMO | Attending: Neurology | Admitting: Physical Therapy

## 2021-04-19 DIAGNOSIS — R296 Repeated falls: Secondary | ICD-10-CM | POA: Insufficient documentation

## 2021-04-19 DIAGNOSIS — M6281 Muscle weakness (generalized): Secondary | ICD-10-CM | POA: Insufficient documentation

## 2021-04-19 NOTE — Telephone Encounter (Signed)
No samples available.  Will make Dr. Tobie Poet aware.

## 2021-04-19 NOTE — Therapy (Signed)
OUTPATIENT PHYSICAL THERAPY TREATMENT NOTE   Patient Name: Anne [name] MRN: [id number] DOB:06-24-1945, 76 y.o., female Today's Date: 04/20/2021  PCP: [name], MD REFERRING PROVIDER: [name], MD   PT End of Session - 04/20/21 1340     Visit Number 2    Number of Visits 6    Date for PT Re-Evaluation 05/16/21    Authorization Type [employer] MCR    PT Start Time 1345    PT Stop Time 1425    PT Time Calculation (min) 40 min    Activity Tolerance Patient tolerated treatment well    Behavior During Therapy [facility] for tasks assessed/performed             Past Medical History:  Diagnosis Date   Abnormal MRI of the head 02/08/2014   Formatting of this note might be different from the original. History of abn MRI brain in 2015 due to concussion. Formatting of this note might be different from the original. Overview:  History of abn MRI brain in 2015 due to concussion.   Acquired absence of both breasts and nipples 06/08/2015   Asthma    daily inhalers   Breast cancer ([place]) 05/2015   left   Breast cancer of upper-inner quadrant of left female breast ([place]) 04/15/2015   COPD (chronic obstructive pulmonary disease) ([place])    denies SOB with ADLs; no O2   Dental crowns present    Depression    Family history of breast cancer in female 04/26/2015   Dx. Niece at 57; dx. Maternal aunt in her late 75s  Formatting of this note might be different from the original. Overview:  Dx. Niece at 47; dx. Maternal aunt in her late 59s   Family history of colon cancer 04/26/2015   Dx. In maternal grandfather in his late 48s-70  Formatting of this note might be different from the original. Overview:  Dx. In maternal grandfather in his late 66s-70   Genetic testing 06/02/2015   Negative for pathogenic mutations within any of 20 genes on the Breast/Ovarian Cancer Panel through [employer].  No variants of uncertain significance (VUSes) were found.  The Breast/Ovarian Cancer Panel offered by  [facility] Hope Pigeon, MD) includes sequencing and deletion/duplication analysis for the following 19 genes:  ATM, BARD1, BRCA1, BRCA2, BRIP1, CDH1, CHEK2, FANCC, M   History of basal cell carcinoma 04/26/2015   Dx. 42  Formatting of this note might be different from the original. Overview:  Dx. 68   History of concussion 01/01/2014   History of kidney stones    History of TIA (transient ischemic attack) 10/2014   Hyperlipidemia    Insomnia    Malrotation of intestine 09/23/2017   Migraine    Nasal congestion 05/26/2015   will finish z-pak 05/27/2015   Nonproductive cough 05/26/2015   PONV (postoperative nausea and vomiting)    "long time ago"  none recently   Psychogenic syncope 04/23/2019   RLS (restless legs syndrome)    Tobacco abuse    Vitamin D deficiency    Wears dentures    lower   Wears partial dentures    upper   Weight loss 08/28/2019   Past Surgical History:  Procedure Laterality Date   ABDOMINAL HYSTERECTOMY     APPENDECTOMY  2000   BREAST BIOPSY Left    BREAST RECONSTRUCTION WITH PLACEMENT OF TISSUE EXPANDER AND FLEX HD (ACELLULAR HYDRATED DERMIS) Bilateral 06/02/2015   Procedure: BILATERAL BREAST RECONSTRUCTION WITH PLACEMENT OF TISSUE EXPANDER AND  ACELLULAR  HYDRATED DERMIS;  Surgeon: Irene Limbo, MD;  Location: [place];  Service: Plastics;  Laterality: Bilateral;   CARPAL TUNNEL RELEASE Bilateral    CATARACT EXTRACTION Bilateral 09/2013   KNEE ARTHROSCOPY Left    LAPAROSCOPIC ABDOMINAL EXPLORATION N/A 10/2017   LAPAROSCOPIC LYSIS OF CONGENITAL ADHESIVE BAND/LADD'S BANDS WIH INTRAOPERATIVE CHOLANGIOGRAPHY. Perforation of small intestion requiring repair.    LOOP RECORDER INSERTION N/A 05/11/2016   Procedure: Loop Recorder Insertion;  Surgeon: Will Meredith Leeds, MD;  Location: [place] [facility];  Service: Cardiovascular;  Laterality: N/A;   MASTECTOMY W/ SENTINEL NODE BIOPSY Bilateral 06/02/2015   Procedure: BILATERAL MASTECTOMY WITH  LEFT SENTINEL LYMPH NODE BIOPSY;  Surgeon: Stark Klein, MD;  Location: [place];  Service: General;  Laterality: Bilateral;   NASAL SEPTUM SURGERY     x 3   PLANTAR FASCIA SURGERY Left    REMOVAL OF BILATERAL TISSUE EXPANDERS WITH PLACEMENT OF BILATERAL BREAST IMPLANTS Bilateral 06/17/2015   Procedure: DEBRIDEMENT OF BILATERAL MASTECTOMY FLAP WITH BILATERAL TISSUE EXPANDER EXCHANGE ;  Surgeon: Irene Limbo, MD;  Location: [place];  Service: Plastics;  Laterality: Bilateral;   REMOVAL OF TISSUE EXPANDER Bilateral 07/12/2015   Procedure: REMOVAL OF BILATERAL TISSUE EXPANDERS;  Surgeon: Irene Limbo, MD;  Location: [place];  Service: Plastics;  Laterality: Bilateral;   Patient Active Problem List   Diagnosis Date Noted   Drug induced constipation 04/10/2021   EIC (epidermal inclusion cyst) 03/21/2021   Acute migraine 01/30/2021   Aortic atherosclerosis ([place]) 01/30/2021   Bilateral carpal tunnel syndrome 01/30/2021   Chronic pain in right shoulder 12/25/2020   Chronic migraine without aura, intractable, without status migrainosus 12/25/2020   Memory loss 10/18/2020   Ataxia 10/18/2020   Frequent falls 10/18/2020   Tremor 10/18/2020   Moderate recurrent major depression ([place]) 10/18/2020   Lumbar back pain 10/18/2020   Simple chronic bronchitis ([place]) 07/04/2019   Right lumbar radiculopathy 04/23/2019   Asthma 09/13/2017   COPD mixed type ([place]) 07/03/2016   Hyperlipidemia 07/03/2016   Tobacco abuse    Sleep disorder 07/02/2016   Carotid artery disease ([place]) 05/04/2016   Migraine aura without headache 02/08/2014    REFERRING PROVIDER: Alda Berthold, DO   REFERRING DIAG: Falls frequently  THERAPY DIAG:  Repeated falls  Muscle weakness (generalized)  PERTINENT HISTORY: Frequent falls, history of left breast cancer s/p double mastectomy (2017), depression/anxiety, COPD with asthma, and migraine   PRECAUTIONS: Fall  SUBJECTIVE: Patient  reports she is doing ok. She has been doing her exercises and they are going well.   PAIN:  Are you having pain? No NPRS scale: N/A Pain location: N/A Pain orientation: N/A PAIN TYPE: N/A Pain description: N/A Aggravating factors: N/A Relieving factors: N/A  PATIENT GOALS: Improve balance, fall less   OBJECTIVE:  PATIENT SURVEYS:  FOTO 60% functional status (predicted 60%)   LE MMT:   MMT Right 04/04/2021 Left 04/04/2021  Hip flexion 4- 4-  Hip extension 3 3  Hip abduction 3 3  Knee flexion 4 4  Knee extension 4+ 4+    FUNCTIONAL TESTS:  5 times sit to stand: 10 seconds Berg Balance Scale: 45  (see bottom of note) TUG: 9 seconds   GAIT: Assistive device utilized: None Level of assistance: Complete Independence Comments: Narrow stance, occasionally with unsteady gait especially with turns      TODAY'S TREATMENT:  04/20/2021:  Therapeutic Exercise: NuStep L5 x 5 min with LE while taking subjective Bridge 2 x  10 SLR 2 x 10 each Supine clamshell with red 2 x 10 Sidelying hip abduction 2 x 10 each LAQ with 2# 2 x 10 each Seated hamstring curl with red 2 x 10 each Sit to stand without UE support 2 x 10 Heel raises 2 x 10 Neuro Re-ed: Romberg on Airex 2 x 60 sec Tandem stance 3 x 30 sec each Romberg stance on Airex with head turns x 10, nods x 10   04/04/2021: (evaluation) Sit to stand without UE support 2 x 10 Standing hip abduction with red at knees x 10 each Tandem stance x 30 sec each Romberg stance with head turns x 10, nods x 10   PATIENT EDUCATION:  Education details: HEP updated Person educated: Patient Education method: Explanation, Demonstration, Tactile cues, Verbal cues, and Handouts Education comprehension: verbalized understanding, returned demonstration, verbal cues required, tactile cues required, and needs further education   HOME EXERCISE PROGRAM: Access Code: [email or web address]     ASSESSMENT: CLINICAL IMPRESSION: Patient tolerated therapy  well with no adverse effects. Therapy focused on progressing strength and balance with good tolerance. She was able to perform exercises with increased resistance and included unstable surface for balance training. She does continue to exhibit gross strength deficit and difficulty with exercises, and more challenge with narrow stance with balance. Patient would benefit from continued skilled PT to progress strength and balance in order to improve gait and reduce fall risk.  Objective impairments include decreased balance and decreased strength.     GOALS: Goals reviewed with patient? Yes   SHORT TERM GOALS:   STG Name Target Date Goal status  1 Patient will be I with initial HEP in order to progress with therapy. Baseline: provided at eval 04/25/2021 INITIAL    LONG TERM GOALS:    LTG Name Target Date Goal status  1 Patient will be I with final HEP to maintain progress from PT. Baseline: provided at eval 05/16/2021 INITIAL  2 Patient will demonstrate BERG balance scale >/= 51/56 to indicate improved balance and reduced fall risk Baseline: 45/56 05/16/2021 INITIAL  3 Patient will demonstrate improve hip strength grossly >/= 4-/5 MMT in order to improve control and ability to correct balance Baseline: grossly 3/5 MMT 05/16/2021 INITIAL  4 Patient will report no limitation in activity level as a result of balance impairment or falls Baseline: patient currently with functional limitations as a result of balance impairment 05/16/2021 INITIAL      PLAN: PT FREQUENCY: 1x/week   PT DURATION: 6 weeks   PLANNED INTERVENTIONS: Therapeutic exercises, Therapeutic activity, Neuro Muscular re-education, Balance training, Gait training, Patient/Family education, Joint mobilization, Stair training, Aquatic Therapy, Dry Needling, and Manual therapy   PLAN FOR NEXT SESSION: Review HEP and progress PRN, continue LE strengthening and balance training, progress dynamic balance    Hilda Blades, PT, DPT,  LAT, ATC 04/20/21  2:36 PM Phone: [phone] Fax: [phone]

## 2021-04-19 NOTE — Therapy (Incomplete)
OUTPATIENT PHYSICAL THERAPY TREATMENT NOTE   Patient Name: Anne [name] MRN: [id number] DOB:Oct 28, 1945, 76 y.o., female Today's Date: 04/19/2021  PCP: [name], MD REFERRING PROVIDER: Alda Berthold, DO    Past Medical History:  Diagnosis Date   Abnormal MRI of the head 02/08/2014   Formatting of this note might be different from the original. History of abn MRI brain in 2015 due to concussion. Formatting of this note might be different from the original. Overview:  History of abn MRI brain in 2015 due to concussion.   Acquired absence of both breasts and nipples 06/08/2015   Asthma    daily inhalers   Breast cancer ([place]) 05/2015   left   Breast cancer of upper-inner quadrant of left female breast ([place]) 04/15/2015   COPD (chronic obstructive pulmonary disease) ([place])    denies SOB with ADLs; no O2   Dental crowns present    Depression    Family history of breast cancer in female 04/26/2015   Dx. Niece at 66; dx. Maternal aunt in her late 38s  Formatting of this note might be different from the original. Overview:  Dx. Niece at 63; dx. Maternal aunt in her late 67s   Family history of colon cancer 04/26/2015   Dx. In maternal grandfather in his late 39s-70  Formatting of this note might be different from the original. Overview:  Dx. In maternal grandfather in his late 34s-70   Genetic testing 06/02/2015   Negative for pathogenic mutations within any of 20 genes on the Breast/Ovarian Cancer Panel through [employer].  No variants of uncertain significance (VUSes) were found.  The Breast/Ovarian Cancer Panel offered by [facility] Hope Pigeon, MD) includes sequencing and deletion/duplication analysis for the following 19 genes:  ATM, BARD1, BRCA1, BRCA2, BRIP1, CDH1, CHEK2, FANCC, M   History of basal cell carcinoma 04/26/2015   Dx. 68  Formatting of this note might be different from the original. Overview:  Dx. 68   History of concussion 01/01/2014   History of kidney  stones    History of TIA (transient ischemic attack) 10/2014   Hyperlipidemia    Insomnia    Malrotation of intestine 09/23/2017   Migraine    Nasal congestion 05/26/2015   will finish z-pak 05/27/2015   Nonproductive cough 05/26/2015   PONV (postoperative nausea and vomiting)    "long time ago"  none recently   Psychogenic syncope 04/23/2019   RLS (restless legs syndrome)    Tobacco abuse    Vitamin D deficiency    Wears dentures    lower   Wears partial dentures    upper   Weight loss 08/28/2019   Past Surgical History:  Procedure Laterality Date   ABDOMINAL HYSTERECTOMY     APPENDECTOMY  2000   BREAST BIOPSY Left    BREAST RECONSTRUCTION WITH PLACEMENT OF TISSUE EXPANDER AND FLEX HD (ACELLULAR HYDRATED DERMIS) Bilateral 06/02/2015   Procedure: BILATERAL BREAST RECONSTRUCTION WITH PLACEMENT OF TISSUE EXPANDER AND  ACELLULAR HYDRATED DERMIS;  Surgeon: Irene Limbo, MD;  Location: [place];  Service: Plastics;  Laterality: Bilateral;   CARPAL TUNNEL RELEASE Bilateral    CATARACT EXTRACTION Bilateral 09/2013   KNEE ARTHROSCOPY Left    LAPAROSCOPIC ABDOMINAL EXPLORATION N/A 10/2017   LAPAROSCOPIC LYSIS OF CONGENITAL ADHESIVE BAND/LADD'S BANDS WIH INTRAOPERATIVE CHOLANGIOGRAPHY. Perforation of small intestion requiring repair.    LOOP RECORDER INSERTION N/A 05/11/2016   Procedure: Loop Recorder Insertion;  Surgeon: Will Meredith Leeds, MD;  Location: [place] [facility];  Service: Cardiovascular;  Laterality: N/A;   MASTECTOMY W/ SENTINEL NODE BIOPSY Bilateral 06/02/2015   Procedure: BILATERAL MASTECTOMY WITH LEFT SENTINEL LYMPH NODE BIOPSY;  Surgeon: Stark Klein, MD;  Location: [place];  Service: General;  Laterality: Bilateral;   NASAL SEPTUM SURGERY     x 3   PLANTAR FASCIA SURGERY Left    REMOVAL OF BILATERAL TISSUE EXPANDERS WITH PLACEMENT OF BILATERAL BREAST IMPLANTS Bilateral 06/17/2015   Procedure: DEBRIDEMENT OF BILATERAL MASTECTOMY FLAP WITH  BILATERAL TISSUE EXPANDER EXCHANGE ;  Surgeon: Irene Limbo, MD;  Location: [place];  Service: Plastics;  Laterality: Bilateral;   REMOVAL OF TISSUE EXPANDER Bilateral 07/12/2015   Procedure: REMOVAL OF BILATERAL TISSUE EXPANDERS;  Surgeon: Irene Limbo, MD;  Location: [place];  Service: Plastics;  Laterality: Bilateral;   Patient Active Problem List   Diagnosis Date Noted   Drug induced constipation 04/10/2021   EIC (epidermal inclusion cyst) 03/21/2021   Acute migraine 01/30/2021   Aortic atherosclerosis ([place]) 01/30/2021   Bilateral carpal tunnel syndrome 01/30/2021   Chronic pain in right shoulder 12/25/2020   Chronic migraine without aura, intractable, without status migrainosus 12/25/2020   Memory loss 10/18/2020   Ataxia 10/18/2020   Frequent falls 10/18/2020   Tremor 10/18/2020   Moderate recurrent major depression ([place]) 10/18/2020   Lumbar back pain 10/18/2020   Simple chronic bronchitis ([place]) 07/04/2019   Right lumbar radiculopathy 04/23/2019   Asthma 09/13/2017   COPD mixed type ([place]) 07/03/2016   Hyperlipidemia 07/03/2016   Tobacco abuse    Sleep disorder 07/02/2016   Carotid artery disease ([place]) 05/04/2016   Migraine aura without headache 02/08/2014    REFERRING PROVIDER: Alda Berthold, DO   REFERRING DIAG: Falls frequently  THERAPY DIAG:  No diagnosis found.  PERTINENT HISTORY: Frequent falls, history of left breast cancer s/p double mastectomy (2017), depression/anxiety, COPD with asthma, and migraine   PRECAUTIONS: Fall  SUBJECTIVE: ***  PAIN:  Are you having pain? No NPRS scale: N/A Pain location: N/A Pain orientation: N/A PAIN TYPE: N/A Pain description: N/A Aggravating factors: N/A Relieving factors: N/A  PATIENT GOALS: Improve balance, fall less   OBJECTIVE:  DIAGNOSTIC FINDINGS: None   PATIENT SURVEYS:  FOTO 60% functional status (predicted 60%)   COGNITION: Overall cognitive status: Within functional limits  for tasks assessed                        SENSATION:          Light touch: Appears intact   MUSCLE LENGTH: Not assessed   POSTURE:  Rounded shoulder posture   PALPATION: Not assessed   LE AROM/PROM:                     Grossly WFL and non-painful   LE MMT:   MMT Right 04/04/2021 Left 04/04/2021  Hip flexion 4- 4-  Hip extension 3 3  Hip abduction 3 3  Knee flexion 4 4  Knee extension 4+ 4+    FUNCTIONAL TESTS:  5 times sit to stand: 10 seconds Berg Balance Scale: 45  (see bottom of note) TUG: 9 seconds   GAIT: Assistive device utilized: None Level of assistance: Complete Independence Comments: Narrow stance, occasionally with unsteady gait especially with turns      TODAY'S TREATMENT: Sit to stand without UE support 2 x 10 Standing hip abduction with red at knees x 10 each Tandem stance x 30 sec each Romberg  stance with head turns x 10, nods x 10     PATIENT EDUCATION:  Education details: Exam findings, POC, HEP Person educated: Patient Education method: Explanation, Demonstration, Tactile cues, Verbal cues, and Handouts Education comprehension: verbalized understanding, returned demonstration, verbal cues required, tactile cues required, and needs further education   HOME EXERCISE PROGRAM: Access Code: [email or web address]     ASSESSMENT: CLINICAL IMPRESSION: Patient is a 76 y.o. female who was seen today for physical therapy evaluation and treatment for frequent falls and balance impairments. Patient seems to have most difficulty with narrow based of support, reaching outside base of support, and dynamic balance including turns. Objective impairments include decreased balance and decreased strength. These impairments are limiting patient from cleaning, community activity, occupation, laundry, yard work, and shopping. Personal factors including Age, Fitness, Past/current experiences, and Time since onset of injury/illness/exacerbation are also affecting patient's  functional outcome. Patient will benefit from skilled PT to address above impairments and improve overall function.   REHAB POTENTIAL: Good   CLINICAL DECISION MAKING: Stable/uncomplicated   EVALUATION COMPLEXITY: Low     GOALS: Goals reviewed with patient? Yes   SHORT TERM GOALS:   STG Name Target Date Goal status  1 Patient will be I with initial HEP in order to progress with therapy. Baseline: provided at eval 04/25/2021 INITIAL    LONG TERM GOALS:    LTG Name Target Date Goal status  1 Patient will be I with final HEP to maintain progress from PT. Baseline: provided at eval 05/16/2021 INITIAL  2 Patient will demonstrate BERG balance scale >/= 51/56 to indicate improved balance and reduced fall risk Baseline: 45/56 05/16/2021 INITIAL  3 Patient will demonstrate improve hip strength grossly >/= 4-/5 MMT in order to improve control and ability to correct balance Baseline: grossly 3/5 MMT 05/16/2021 INITIAL  4 Patient will report no limitation in activity level as a result of balance impairment or falls Baseline: patient currently with functional limitations as a result of balance impairment 05/16/2021 INITIAL      PLAN: PT FREQUENCY: 1x/week   PT DURATION: 6 weeks   PLANNED INTERVENTIONS: Therapeutic exercises, Therapeutic activity, Neuro Muscular re-education, Balance training, Gait training, Patient/Family education, Joint mobilization, Stair training, Aquatic Therapy, Dry Needling, and Manual therapy   PLAN FOR NEXT SESSION: Review HEP and progress PRN, continue LE strengthening and balance training, progress dynamic balance    Hilda Blades, PT, DPT, LAT, ATC 04/19/21  3:32 PM Phone: [phone] Fax: [phone]

## 2021-04-20 ENCOUNTER — Other Ambulatory Visit: Payer: Self-pay

## 2021-04-20 ENCOUNTER — Encounter: Payer: Self-pay | Admitting: Physical Therapy

## 2021-04-20 ENCOUNTER — Ambulatory Visit: Payer: Medicare HMO | Admitting: Physical Therapy

## 2021-04-20 DIAGNOSIS — R296 Repeated falls: Secondary | ICD-10-CM

## 2021-04-20 DIAGNOSIS — M5416 Radiculopathy, lumbar region: Secondary | ICD-10-CM

## 2021-04-20 DIAGNOSIS — M6281 Muscle weakness (generalized): Secondary | ICD-10-CM

## 2021-04-20 MED ORDER — HYDROCODONE-ACETAMINOPHEN 5-325 MG PO TABS: 1.0000 | ORAL_TABLET | Freq: Every day | ORAL | 0 refills | Status: DC | PRN

## 2021-04-20 NOTE — Patient Instructions (Signed)
Access Code: [email or web address] URL: [email or web address] Date: 04/20/2021 Prepared by: Hilda Blades  Exercises Straight Leg Raise - 1 x daily - 7 x weekly - 2 sets - 10 reps Bridge - 1 x daily - 7 x weekly - 2 sets - 10 reps Sit to Stand Without Arm Support - 7 x weekly - 3 sets - 10 reps Standing Hip Abduction with Resistance at Thighs - 7 x weekly - 3 sets - 10 reps Tandem Stance with Support - 7 x weekly - 3 sets - 30 seconds hold Romberg Stance with Head Rotation - 7 x weekly - 3 sets - 10 reps Romberg Stance with Head Nods - 7 x weekly - 3 sets - 10 reps

## 2021-04-23 ENCOUNTER — Other Ambulatory Visit: Payer: Self-pay | Admitting: Family Medicine

## 2021-04-24 ENCOUNTER — Other Ambulatory Visit: Payer: Self-pay

## 2021-04-24 ENCOUNTER — Ambulatory Visit (INDEPENDENT_AMBULATORY_CARE_PROVIDER_SITE_OTHER): Payer: Medicare HMO

## 2021-04-24 DIAGNOSIS — I10 Essential (primary) hypertension: Secondary | ICD-10-CM

## 2021-04-24 DIAGNOSIS — G43719 Chronic migraine without aura, intractable, without status migrainosus: Secondary | ICD-10-CM

## 2021-04-24 DIAGNOSIS — F331 Major depressive disorder, recurrent, moderate: Secondary | ICD-10-CM

## 2021-04-24 DIAGNOSIS — E782 Mixed hyperlipidemia: Secondary | ICD-10-CM
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
## 2021-04-25 ENCOUNTER — Telehealth: Payer: Self-pay

## 2021-04-25 NOTE — Chronic Care Management (AMB) (Signed)
Chronic Care Management Pharmacy Assistant   Name: Anne [name]  MRN: [id number] DOB: [birth year]  Reason for Encounter: Prior Authorization   04/25/2021- Prior Authorization started for Trulance 3 mg through CoverMyMeds. After completing message given: There is an EOC that was clinically denied within the last 60 days. This request needs to be sent to the Grievance and [employer]. at [facility]. Appeal requests must come from the member or the provider.   Filled out Grievance/Appeal request form, printed, awaiting provider signature to fax.  04/28/2021- Unable to fax appeal form, mailed to [facility].   05/01/2021- Called patient to inform about appeal process for Trulance, Patient was also denied for [facility] and had questions regarding this medication also. Patient aware Dr Tobie Poet Recommend patient come and pick up samples of aimovig 70 mg monthly. But samples were not available. Patient aware I will send a message to the office regarding another medication. Also while on the phone with patient she complained of not feeling well. Head cold, coughing, and not feeling well at all. Patient aware I will send an urgent message to staff to contact her for a virtual visit. Patient was very thankful and will await call from Dr. Tobie Poet office. Sent request for medication and office visit to Clinical Team.   Medications: Outpatient Encounter Medications as of 04/25/2021  Medication Sig   albuterol (VENTOLIN HFA) 108 (90 Base) MCG/ACT inhaler INHALE 1 TO 2 PUFFS BY MOUTH EVERY 6 HOURS AS NEEDED FOR WHEEZING FOR SHORTNESS OF BREATH   amitriptyline (ELAVIL) 75 MG tablet TAKE 1 TABLET (75 MG TOTAL) BY MOUTH DAILY.   ARIPiprazole (ABILIFY) 15 MG tablet Take 1 tablet (15 mg total) by mouth daily.   Atogepant ([name]) 60 MG TABS Take 60 mg by mouth daily. (Patient not taking: Reported on 04/24/2021)   Atogepant ([name]) 60 MG TABS Take 60 mg by mouth daily.   cetirizine (ZYRTEC) 10 MG tablet Take 1 tablet (10 mg  total) by mouth daily.   doxycycline (VIBRA-TABS) 100 MG tablet Take 100 mg by mouth 2 (two) times daily.   Fluticasone-Umeclidin-Vilant (TRELEGY ELLIPTA) 200-62.5-25 MCG/ACT AEPB Inhale 1 puff into the lungs daily.   furosemide (LASIX) 20 MG tablet Take 1 tablet (20 mg total) by mouth daily as needed for edema (for leg swelling).   gabapentin (NEURONTIN) 300 MG capsule Take 2 capsules (600 mg total) by mouth 3 (three) times daily.   HYDROcodone-acetaminophen (NORCO/VICODIN) 5-325 MG tablet Take 1 tablet by mouth daily as needed for severe pain (migraines).   Melatonin 1 MG CAPS Take by mouth.   meloxicam (MOBIC) 15 MG tablet TAKE 1 TABLET EVERY DAY   mirtazapine (REMERON) 45 MG tablet TAKE 1 TABLET (45 MG TOTAL) BY MOUTH AT BEDTIME.   omeprazole (PRILOSEC) 40 MG capsule TAKE 1 CAPSULE TWICE DAILY   orphenadrine (NORFLEX) 100 MG tablet TAKE 1 TABLET (100 MG TOTAL) BY MOUTH 2 (TWO) TIMES DAILY AS NEEDED FOR MUSCLE SPASMS.   Plecanatide (TRULANCE) 3 MG TABS Take 1 tablet by mouth daily. (Patient not taking: Reported on 04/24/2021)   Plecanatide (TRULANCE) 3 MG TABS Take 1 tablet by mouth daily.   primidone (MYSOLINE) 50 MG tablet TAKE 1 TABLET BY MOUTH IN THE MORNING AND TAKE 1 TABLET AT BEDTIME   propranolol (INDERAL) 20 MG tablet Take 1 tablet (20 mg total) by mouth 2 (two) times daily.   rizatriptan (MAXALT-MLT) 10 MG disintegrating tablet    SUMAtriptan (IMITREX) 100 MG tablet May repeat once in 2  hours, if headache persists or recurs. No more than 2 in 24 hours.   traZODone (DESYREL) 100 MG tablet TAKE 1 TABLET (100 MG TOTAL) BY MOUTH AT BEDTIME AS NEEDED FOR SLEEP.   No facility-administered encounter medications on file as of 04/25/2021.   [name], [place] Pharmacist Assistant [phone]'

## 2021-04-26 ENCOUNTER — Encounter: Payer: Self-pay | Admitting: Physical Therapy

## 2021-04-26 ENCOUNTER — Ambulatory Visit: Payer: Medicare HMO | Admitting: Physical Therapy

## 2021-04-26 ENCOUNTER — Other Ambulatory Visit: Payer: Self-pay

## 2021-04-26 DIAGNOSIS — R296 Repeated falls: Secondary | ICD-10-CM

## 2021-04-26 DIAGNOSIS — M6281 Muscle weakness (generalized): Secondary | ICD-10-CM

## 2021-04-26 NOTE — Therapy (Signed)
OUTPATIENT PHYSICAL THERAPY TREATMENT NOTE   Patient Name: Anne [name] MRN: [id number] DOB:30-Apr-1945, 76 y.o., female Today's Date: 04/26/2021  PCP: [name], MD REFERRING PROVIDER: Alda Berthold, DO   PT End of Session - 04/26/21 1519     Visit Number 3    Number of Visits 6    Date for PT Re-Evaluation 05/16/21    Authorization Type [employer] MCR    PT Start Time 8413    PT Stop Time 1615    PT Time Calculation (min) 45 min    Activity Tolerance Patient tolerated treatment well    Behavior During Therapy [facility] for tasks assessed/performed              Past Medical History:  Diagnosis Date   Abnormal MRI of the head 02/08/2014   Formatting of this note might be different from the original. History of abn MRI brain in 2015 due to concussion. Formatting of this note might be different from the original. Overview:  History of abn MRI brain in 2015 due to concussion.   Acquired absence of both breasts and nipples 06/08/2015   Asthma    daily inhalers   Breast cancer ([place]) 05/2015   left   Breast cancer of upper-inner quadrant of left female breast ([place]) 04/15/2015   COPD (chronic obstructive pulmonary disease) ([place])    denies SOB with ADLs; no O2   Dental crowns present    Depression    Family history of breast cancer in female 04/26/2015   Dx. Niece at 9; dx. Maternal aunt in her late 61s  Formatting of this note might be different from the original. Overview:  Dx. Niece at 92; dx. Maternal aunt in her late 64s   Family history of colon cancer 04/26/2015   Dx. In maternal grandfather in his late 23s-70  Formatting of this note might be different from the original. Overview:  Dx. In maternal grandfather in his late 65s-70   Genetic testing 06/02/2015   Negative for pathogenic mutations within any of 20 genes on the Breast/Ovarian Cancer Panel through [employer].  No variants of uncertain significance (VUSes) were found.  The Breast/Ovarian Cancer Panel offered by  [facility] Hope Pigeon, MD) includes sequencing and deletion/duplication analysis for the following 19 genes:  ATM, BARD1, BRCA1, BRCA2, BRIP1, CDH1, CHEK2, FANCC, M   History of basal cell carcinoma 04/26/2015   Dx. 72  Formatting of this note might be different from the original. Overview:  Dx. 68   History of concussion 01/01/2014   History of kidney stones    History of TIA (transient ischemic attack) 10/2014   Hyperlipidemia    Insomnia    Malrotation of intestine 09/23/2017   Migraine    Nasal congestion 05/26/2015   will finish z-pak 05/27/2015   Nonproductive cough 05/26/2015   PONV (postoperative nausea and vomiting)    "long time ago"  none recently   Psychogenic syncope 04/23/2019   RLS (restless legs syndrome)    Tobacco abuse    Vitamin D deficiency    Wears dentures    lower   Wears partial dentures    upper   Weight loss 08/28/2019   Past Surgical History:  Procedure Laterality Date   ABDOMINAL HYSTERECTOMY     APPENDECTOMY  2000   BREAST BIOPSY Left    BREAST RECONSTRUCTION WITH PLACEMENT OF TISSUE EXPANDER AND FLEX HD (ACELLULAR HYDRATED DERMIS) Bilateral 06/02/2015   Procedure: BILATERAL BREAST RECONSTRUCTION WITH PLACEMENT OF TISSUE EXPANDER AND  ACELLULAR HYDRATED DERMIS;  Surgeon: Irene Limbo, MD;  Location: [place];  Service: Plastics;  Laterality: Bilateral;   CARPAL TUNNEL RELEASE Bilateral    CATARACT EXTRACTION Bilateral 09/2013   KNEE ARTHROSCOPY Left    LAPAROSCOPIC ABDOMINAL EXPLORATION N/A 10/2017   LAPAROSCOPIC LYSIS OF CONGENITAL ADHESIVE BAND/LADD'S BANDS WIH INTRAOPERATIVE CHOLANGIOGRAPHY. Perforation of small intestion requiring repair.    LOOP RECORDER INSERTION N/A 05/11/2016   Procedure: Loop Recorder Insertion;  Surgeon: Will Meredith Leeds, MD;  Location: [place] [facility];  Service: Cardiovascular;  Laterality: N/A;   MASTECTOMY W/ SENTINEL NODE BIOPSY Bilateral 06/02/2015   Procedure: BILATERAL MASTECTOMY WITH  LEFT SENTINEL LYMPH NODE BIOPSY;  Surgeon: Stark Klein, MD;  Location: [place];  Service: General;  Laterality: Bilateral;   NASAL SEPTUM SURGERY     x 3   PLANTAR FASCIA SURGERY Left    REMOVAL OF BILATERAL TISSUE EXPANDERS WITH PLACEMENT OF BILATERAL BREAST IMPLANTS Bilateral 06/17/2015   Procedure: DEBRIDEMENT OF BILATERAL MASTECTOMY FLAP WITH BILATERAL TISSUE EXPANDER EXCHANGE ;  Surgeon: Irene Limbo, MD;  Location: [place];  Service: Plastics;  Laterality: Bilateral;   REMOVAL OF TISSUE EXPANDER Bilateral 07/12/2015   Procedure: REMOVAL OF BILATERAL TISSUE EXPANDERS;  Surgeon: Irene Limbo, MD;  Location: [place];  Service: Plastics;  Laterality: Bilateral;   Patient Active Problem List   Diagnosis Date Noted   Drug induced constipation 04/10/2021   EIC (epidermal inclusion cyst) 03/21/2021   Acute migraine 01/30/2021   Aortic atherosclerosis ([place]) 01/30/2021   Bilateral carpal tunnel syndrome 01/30/2021   Chronic pain in right shoulder 12/25/2020   Chronic migraine without aura, intractable, without status migrainosus 12/25/2020   Memory loss 10/18/2020   Ataxia 10/18/2020   Frequent falls 10/18/2020   Tremor 10/18/2020   Moderate recurrent major depression ([place]) 10/18/2020   Lumbar back pain 10/18/2020   Simple chronic bronchitis ([place]) 07/04/2019   Right lumbar radiculopathy 04/23/2019   Asthma 09/13/2017   COPD mixed type ([place]) 07/03/2016   Hyperlipidemia 07/03/2016   Tobacco abuse    Sleep disorder 07/02/2016   Carotid artery disease ([place]) 05/04/2016   Migraine aura without headache 02/08/2014    REFERRING PROVIDER: Alda Berthold, DO   REFERRING DIAG: Falls frequently  THERAPY DIAG:  Repeated falls  Muscle weakness (generalized)  PERTINENT HISTORY: Frequent falls, history of left breast cancer s/p double mastectomy (2017), depression/anxiety, COPD with asthma, and migraine   PRECAUTIONS: Fall  SUBJECTIVE: Patient  reports she good following last visit and the exercises at home are going well. Patient does report she is not feeling very well today.   PAIN:  Are you having pain? No NPRS scale: N/A Pain location: N/A Pain orientation: N/A PAIN TYPE: N/A Pain description: N/A Aggravating factors: N/A Relieving factors: N/A  PATIENT GOALS: Improve balance, fall less   OBJECTIVE:  PATIENT SURVEYS:  FOTO 60% functional status (predicted 60%)   LE MMT:   MMT Right 04/04/2021 Left 04/04/2021  Hip flexion 4- 4-  Hip extension 3 3  Hip abduction 3 3  Knee flexion 4 4  Knee extension 4+ 4+    FUNCTIONAL TESTS:  5 times sit to stand: 10 seconds Berg Balance Scale: 45 TUG: 9 seconds   GAIT: Assistive device utilized: None Level of assistance: Complete Independence Comments: Narrow stance, occasionally with unsteady gait especially with turns      TODAY'S TREATMENT: 04/20/2021:  Therapeutic Exercise: NuStep L3 x 5 min with LE while taking subjective  SLR x 10 Bridge x 10 Sidelying hip abduction 2 x 10 LAQ 2# 2 x 15 Sit to stand with 6# at chest 2 x 10 Neuro Re-ed: 3/4 tandem on Airex 2 x 30 sec each Tandem stance 3 x 30 sec each Rocker board with forward/backward taps 2 x 20 Romberg stance on Airex with head turns x 10, nods x 10, alternating forward reach x 10   04/20/2021:  Therapeutic Exercise: NuStep L5 x 5 min with LE while taking subjective Bridge 2 x 10 SLR 2 x 10 each Supine clamshell with red 2 x 10 Sidelying hip abduction 2 x 10 each LAQ with 2# 2 x 10 each Seated hamstring curl with red 2 x 10 each Sit to stand without UE support 2 x 10 Heel raises 2 x 10 Neuro Re-ed: Romberg on Airex 2 x 60 sec Tandem stance 3 x 30 sec each Romberg stance on Airex with head turns x 10, nods x 10  04/04/2021: (evaluation) Sit to stand without UE support 2 x 10 Standing hip abduction with red at knees x 10 each Tandem stance x 30 sec each Romberg stance with head turns x 10,  nods x 10   PATIENT EDUCATION:  Education details: HEP Person educated: Patient Education method: Consulting civil engineer, Demonstration, Corporate treasurer cues, Verbal cues Education comprehension: verbalized understanding, returned demonstration, verbal cues required, tactile cues required, and needs further education   HOME EXERCISE PROGRAM: Access Code: [email or web address]     ASSESSMENT: CLINICAL IMPRESSION: Patient tolerated therapy well with no adverse effects. She did report she wasn't feeling well so she was provided longer rest breaks between exercises this visit. She was able to tolerate continued LE strengthening exercises and balance progression. She did well with additional unlevel/unstable surfaces this visit and with reaching outside her BOS. No changes made to HEP. Patient would benefit from continued skilled PT to progress strength and balance in order to improve gait and reduce fall risk.  Objective impairments include decreased balance and decreased strength.     GOALS: Goals reviewed with patient? Yes   SHORT TERM GOALS:   STG Name Target Date Goal status  1 Patient will be I with initial HEP in order to progress with therapy. Baseline: progressing 04/25/2021 ONGOING    LONG TERM GOALS:    LTG Name Target Date Goal status  1 Patient will be I with final HEP to maintain progress from PT. Baseline: provided at eval 05/16/2021 INITIAL  2 Patient will demonstrate BERG balance scale >/= 51/56 to indicate improved balance and reduced fall risk Baseline: 45/56 05/16/2021 INITIAL  3 Patient will demonstrate improve hip strength grossly >/= 4-/5 MMT in order to improve control and ability to correct balance Baseline: grossly 3/5 MMT 05/16/2021 INITIAL  4 Patient will report no limitation in activity level as a result of balance impairment or falls Baseline: patient currently with functional limitations as a result of balance impairment 05/16/2021 INITIAL      PLAN: PT FREQUENCY: 1x/week   PT  DURATION: 6 weeks   PLANNED INTERVENTIONS: Therapeutic exercises, Therapeutic activity, Neuro Muscular re-education, Balance training, Gait training, Patient/Family education, Joint mobilization, Stair training, Aquatic Therapy, Dry Needling, and Manual therapy   PLAN FOR NEXT SESSION: Review HEP and progress PRN, continue LE strengthening and balance training, progress dynamic balance    Hilda Blades, PT, DPT, LAT, ATC 04/26/21  3:20 PM Phone: [phone] Fax: [phone]

## 2021-04-27 NOTE — Therapy (Incomplete)
OUTPATIENT PHYSICAL THERAPY TREATMENT NOTE   Patient Name: Anne [name] MRN: [id number] DOB:07-Feb-1946, 76 y.o., female Today's Date: 04/27/2021  PCP: [name], MD REFERRING PROVIDER: [name], MD      Past Medical History:  Diagnosis Date   Abnormal MRI of the head 02/08/2014   Formatting of this note might be different from the original. History of abn MRI brain in 2015 due to concussion. Formatting of this note might be different from the original. Overview:  History of abn MRI brain in 2015 due to concussion.   Acquired absence of both breasts and nipples 06/08/2015   Asthma    daily inhalers   Breast cancer ([place]) 05/2015   left   Breast cancer of upper-inner quadrant of left female breast ([place]) 04/15/2015   COPD (chronic obstructive pulmonary disease) ([place])    denies SOB with ADLs; no O2   Dental crowns present    Depression    Family history of breast cancer in female 04/26/2015   Dx. Niece at 58; dx. Maternal aunt in her late 71s  Formatting of this note might be different from the original. Overview:  Dx. Niece at 78; dx. Maternal aunt in her late 54s   Family history of colon cancer 04/26/2015   Dx. In maternal grandfather in his late 56s-70  Formatting of this note might be different from the original. Overview:  Dx. In maternal grandfather in his late 10s-70   Genetic testing 06/02/2015   Negative for pathogenic mutations within any of 20 genes on the Breast/Ovarian Cancer Panel through [employer].  No variants of uncertain significance (VUSes) were found.  The Breast/Ovarian Cancer Panel offered by [facility] Hope Pigeon, MD) includes sequencing and deletion/duplication analysis for the following 19 genes:  ATM, BARD1, BRCA1, BRCA2, BRIP1, CDH1, CHEK2, FANCC, M   History of basal cell carcinoma 04/26/2015   Dx. 68  Formatting of this note might be different from the original. Overview:  Dx. 68   History of concussion 01/01/2014   History of  kidney stones    History of TIA (transient ischemic attack) 10/2014   Hyperlipidemia    Insomnia    Malrotation of intestine 09/23/2017   Migraine    Nasal congestion 05/26/2015   will finish z-pak 05/27/2015   Nonproductive cough 05/26/2015   PONV (postoperative nausea and vomiting)    "long time ago"  none recently   Psychogenic syncope 04/23/2019   RLS (restless legs syndrome)    Tobacco abuse    Vitamin D deficiency    Wears dentures    lower   Wears partial dentures    upper   Weight loss 08/28/2019   Past Surgical History:  Procedure Laterality Date   ABDOMINAL HYSTERECTOMY     APPENDECTOMY  2000   BREAST BIOPSY Left    BREAST RECONSTRUCTION WITH PLACEMENT OF TISSUE EXPANDER AND FLEX HD (ACELLULAR HYDRATED DERMIS) Bilateral 06/02/2015   Procedure: BILATERAL BREAST RECONSTRUCTION WITH PLACEMENT OF TISSUE EXPANDER AND  ACELLULAR HYDRATED DERMIS;  Surgeon: Irene Limbo, MD;  Location: [place];  Service: Plastics;  Laterality: Bilateral;   CARPAL TUNNEL RELEASE Bilateral    CATARACT EXTRACTION Bilateral 09/2013   KNEE ARTHROSCOPY Left    LAPAROSCOPIC ABDOMINAL EXPLORATION N/A 10/2017   LAPAROSCOPIC LYSIS OF CONGENITAL ADHESIVE BAND/LADD'S BANDS WIH INTRAOPERATIVE CHOLANGIOGRAPHY. Perforation of small intestion requiring repair.    LOOP RECORDER INSERTION N/A 05/11/2016   Procedure: Loop Recorder Insertion;  Surgeon: Will Meredith Leeds, MD;  Location: [place];  Service: Cardiovascular;  Laterality: N/A;   MASTECTOMY W/ SENTINEL NODE BIOPSY Bilateral 06/02/2015   Procedure: BILATERAL MASTECTOMY WITH LEFT SENTINEL LYMPH NODE BIOPSY;  Surgeon: Stark Klein, MD;  Location: [facility];  Service: General;  Laterality: Bilateral;   NASAL SEPTUM SURGERY     x 3   PLANTAR FASCIA SURGERY Left    REMOVAL OF BILATERAL TISSUE EXPANDERS WITH PLACEMENT OF BILATERAL BREAST IMPLANTS Bilateral 06/17/2015   Procedure: DEBRIDEMENT OF BILATERAL MASTECTOMY FLAP  WITH BILATERAL TISSUE EXPANDER EXCHANGE ;  Surgeon: Irene Limbo, MD;  Location: [facility];  Service: Plastics;  Laterality: Bilateral;   REMOVAL OF TISSUE EXPANDER Bilateral 07/12/2015   Procedure: REMOVAL OF BILATERAL TISSUE EXPANDERS;  Surgeon: Irene Limbo, MD;  Location: [facility];  Service: Plastics;  Laterality: Bilateral;   Patient Active Problem List   Diagnosis Date Noted   Drug induced constipation 04/10/2021   EIC (epidermal inclusion cyst) 03/21/2021   Acute migraine 01/30/2021   Aortic atherosclerosis ([place]) 01/30/2021   Bilateral carpal tunnel syndrome 01/30/2021   Chronic pain in right shoulder 12/25/2020   Chronic migraine without aura, intractable, without status migrainosus 12/25/2020   Memory loss 10/18/2020   Ataxia 10/18/2020   Frequent falls 10/18/2020   Tremor 10/18/2020   Moderate recurrent major depression ([place]) 10/18/2020   Lumbar back pain 10/18/2020   Simple chronic bronchitis ([place]) 07/04/2019   Right lumbar radiculopathy 04/23/2019   Asthma 09/13/2017   COPD mixed type ([place]) 07/03/2016   Hyperlipidemia 07/03/2016   Tobacco abuse    Sleep disorder 07/02/2016   Carotid artery disease ([place]) 05/04/2016   Migraine aura without headache 02/08/2014    REFERRING PROVIDER: Alda Berthold, DO   REFERRING DIAG: Falls frequently  THERAPY DIAG:  No diagnosis found.  PERTINENT HISTORY: Frequent falls, history of left breast cancer s/p double mastectomy (2017), depression/anxiety, COPD with asthma, and migraine   PRECAUTIONS: Fall  SUBJECTIVE: Patient reports she good following last visit and the exercises at home are going well. Patient does report she is not feeling very well today.   PAIN:  Are you having pain? No NPRS scale: N/A Pain location: N/A Pain orientation: N/A PAIN TYPE: N/A Pain description: N/A Aggravating factors: N/A Relieving factors: N/A  PATIENT GOALS: Improve balance, fall less   OBJECTIVE:  PATIENT  SURVEYS:  FOTO 60% functional status (predicted 60%)   LE MMT:   MMT Right 04/04/2021 Left 04/04/2021  Hip flexion 4- 4-  Hip extension 3 3  Hip abduction 3 3  Knee flexion 4 4  Knee extension 4+ 4+    FUNCTIONAL TESTS:  5 times sit to stand: 10 seconds Berg Balance Scale: 45 TUG: 9 seconds   GAIT: Assistive device utilized: None Level of assistance: Complete Independence Comments: Narrow stance, occasionally with unsteady gait especially with turns      TODAY'S TREATMENT: 05/01/2021:  Therapeutic Exercise: NuStep L3 x 5 min with LE while taking subjective SLR x 10 Bridge x 10 Sidelying hip abduction 2 x 10 LAQ 2# 2 x 15 Sit to stand with 6# at chest 2 x 10 Neuro Re-ed: 3/4 tandem on Airex 2 x 30 sec each Tandem stance 3 x 30 sec each Rocker board with forward/backward taps 2 x 20 Romberg stance on Airex with head turns x 10, nods x 10, alternating forward reach x 10   04/26/2021:  Therapeutic Exercise: NuStep L3 x 5 min with LE while taking subjective SLR x 10 Bridge x  10 Sidelying hip abduction 2 x 10 LAQ 2# 2 x 15 Sit to stand with 6# at chest 2 x 10 Neuro Re-ed: 3/4 tandem on Airex 2 x 30 sec each Tandem stance 3 x 30 sec each Rocker board with forward/backward taps 2 x 20 Romberg stance on Airex with head turns x 10, nods x 10, alternating forward reach x 10  04/20/2021:  Therapeutic Exercise: NuStep L5 x 5 min with LE while taking subjective Bridge 2 x 10 SLR 2 x 10 each Supine clamshell with red 2 x 10 Sidelying hip abduction 2 x 10 each LAQ with 2# 2 x 10 each Seated hamstring curl with red 2 x 10 each Sit to stand without UE support 2 x 10 Heel raises 2 x 10 Neuro Re-ed: Romberg on Airex 2 x 60 sec Tandem stance 3 x 30 sec each Romberg stance on Airex with head turns x 10, nods x 10   PATIENT EDUCATION:  Education details: HEP Person educated: Patient Education method: Consulting civil engineer, Demonstration, Corporate treasurer cues, Verbal cues Education  comprehension: verbalized understanding, returned demonstration, verbal cues required, tactile cues required, and needs further education   HOME EXERCISE PROGRAM: Access Code: [email or web address]     ASSESSMENT: CLINICAL IMPRESSION: Patient tolerated therapy well with no adverse effects. She did report she wasn't feeling well so she was provided longer rest breaks between exercises this visit. She was able to tolerate continued LE strengthening exercises and balance progression. She did well with additional unlevel/unstable surfaces this visit and with reaching outside her BOS. No changes made to HEP. Patient would benefit from continued skilled PT to progress strength and balance in order to improve gait and reduce fall risk.  Objective impairments include decreased balance and decreased strength.     GOALS: Goals reviewed with patient? Yes   SHORT TERM GOALS:   STG Name Target Date Goal status  1 Patient will be I with initial HEP in order to progress with therapy. Baseline: progressing 04/25/2021 ONGOING    LONG TERM GOALS:    LTG Name Target Date Goal status  1 Patient will be I with final HEP to maintain progress from PT. Baseline: provided at eval 05/16/2021 INITIAL  2 Patient will demonstrate BERG balance scale >/= 51/56 to indicate improved balance and reduced fall risk Baseline: 45/56 05/16/2021 INITIAL  3 Patient will demonstrate improve hip strength grossly >/= 4-/5 MMT in order to improve control and ability to correct balance Baseline: grossly 3/5 MMT 05/16/2021 INITIAL  4 Patient will report no limitation in activity level as a result of balance impairment or falls Baseline: patient currently with functional limitations as a result of balance impairment 05/16/2021 INITIAL      PLAN: PT FREQUENCY: 1x/week   PT DURATION: 6 weeks   PLANNED INTERVENTIONS: Therapeutic exercises, Therapeutic activity, Neuro Muscular re-education, Balance training, Gait training, Patient/Family  education, Joint mobilization, Stair training, Aquatic Therapy, Dry Needling, and Manual therapy   PLAN FOR NEXT SESSION: Review HEP and progress PRN, continue LE strengthening and balance training, progress dynamic balance    Hilda Blades, PT, DPT, LAT, ATC 04/27/21  1:05 PM Phone: [phone] Fax: [phone]

## 2021-05-01 ENCOUNTER — Telehealth: Payer: Self-pay | Admitting: Physical Therapy

## 2021-05-01 ENCOUNTER — Ambulatory Visit: Payer: Medicare HMO | Admitting: Physical Therapy

## 2021-05-01 NOTE — Telephone Encounter (Signed)
Attempted to contact patient due to missed PT appointment. No answer and unable to leave VM.  Hilda Blades, PT, DPT, LAT, ATC 05/01/21  4:10 PM Phone: [phone] Fax: [phone]

## 2021-05-02 DIAGNOSIS — I10 Essential (primary) hypertension: Secondary | ICD-10-CM

## 2021-05-02 DIAGNOSIS — E782 Mixed hyperlipidemia: Secondary | ICD-10-CM

## 2021-05-02 DIAGNOSIS — F331 Major depressive disorder, recurrent, moderate: Secondary | ICD-10-CM

## 2021-05-03 ENCOUNTER — Telehealth: Payer: Self-pay

## 2021-05-03 NOTE — Telephone Encounter (Signed)
Spoke with patient and she is coming in March 2nd for an appointment per Dr. Tobie Poet. ?

## 2021-05-03 NOTE — Telephone Encounter (Signed)
-----   Message from [name], MD sent at 05/02/2021  5:07 PM EST ----- ?Regarding: FW: Sick patient ?Call patient first thing Wednesday morning please. Needs at the very least a virtual visit and would be better to be seen! Dr Tobie Poet  ? ? ?----- Message ----- ?From: [name] E ?Sent: 05/01/2021   3:34 PM EST ?To: [name], MD, [address], [facility], # ?Subject: Sick patient                                  ? ?Good afternoon, ? ?Called patient regarding appeal that I am working on with one of her medication (Trulance) Patient was also denied for [facility]- Dr Cox Recommend patient come and pick up samples of aimovig 70 mg monthly. But samples were not available. Is there another medication she suggests? ? ?Also while on the phone with patient she complained of not feeling well. Head cold, coughing, patient would like a virtual visit, if someone can please call her to schedule.  ? ?Thank you ? ?[name], CMA ?Clinical Pharmacist Assistant ?[phone]' ? ? ? ? ?

## 2021-05-03 NOTE — Telephone Encounter (Signed)
-----   Message from [name], MD sent at 05/02/2021  5:07 PM EST ----- ?Regarding: FW: Sick patient ?Call patient first thing Wednesday morning please. Needs at the very least a virtual visit and would be better to be seen! Dr Tobie Poet  ? ? ?----- Message ----- ?From: [name] E ?Sent: 05/01/2021   3:34 PM EST ?To: [name], MD, Lane Hacker, [facility], # ?Subject: Sick patient                                  ? ?Good afternoon, ? ?Called patient regarding appeal that I am working on with one of her medication (Trulance) Patient was also denied for [facility]- Dr Cox Recommend patient come and pick up samples of aimovig 70 mg monthly. But samples were not available. Is there another medication she suggests? ? ?Also while on the phone with patient she complained of not feeling well. Head cold, coughing, patient would like a virtual visit, if someone can please call her to schedule.  ? ?Thank you ? ?[name], CMA ?Clinical Pharmacist Assistant ?[phone]' ? ? ? ? ?

## 2021-05-03 NOTE — Telephone Encounter (Signed)
Left message for patient to return call.

## 2021-05-04 ENCOUNTER — Encounter: Payer: Self-pay | Admitting: Family Medicine

## 2021-05-04 ENCOUNTER — Ambulatory Visit (INDEPENDENT_AMBULATORY_CARE_PROVIDER_SITE_OTHER): Payer: Medicare HMO | Admitting: Family Medicine

## 2021-05-04 VITALS — BP 104/52 | HR 72 | Temp 97.0°F | Resp 16 | Ht 61.0 in | Wt 86.2 lb

## 2021-05-04 DIAGNOSIS — J019 Acute sinusitis, unspecified: Secondary | ICD-10-CM | POA: Insufficient documentation

## 2021-05-04 DIAGNOSIS — J018 Other acute sinusitis: Secondary | ICD-10-CM | POA: Diagnosis not present

## 2021-05-04 DIAGNOSIS — J441 Chronic obstructive pulmonary disease with (acute) exacerbation: Secondary | ICD-10-CM | POA: Diagnosis not present

## 2021-05-04 DIAGNOSIS — R051 Acute cough: Secondary | ICD-10-CM

## 2021-05-04 DIAGNOSIS — R0600 Dyspnea, unspecified: Secondary | ICD-10-CM | POA: Diagnosis not present

## 2021-05-04 DIAGNOSIS — R634 Abnormal weight loss: Secondary | ICD-10-CM

## 2021-05-04 DIAGNOSIS — R0602 Shortness of breath: Secondary | ICD-10-CM | POA: Diagnosis not present

## 2021-05-04 DIAGNOSIS — J0181 Other acute recurrent sinusitis: Secondary | ICD-10-CM | POA: Insufficient documentation

## 2021-05-04 HISTORY — DX: Acute sinusitis, unspecified: J01.90

## 2021-05-04 LAB — POC COVID19 BINAXNOW: SARS Coronavirus 2 Ag: NEGATIVE

## 2021-05-04 MED ORDER — AMOXICILLIN-POT CLAVULANATE 875-125 MG PO TABS: 1.0000 | ORAL_TABLET | Freq: Two times a day (BID) | ORAL | 0 refills | Status: DC

## 2021-05-04 MED ORDER — TRIAMCINOLONE ACETONIDE 40 MG/ML IJ SUSP
80.0000 mg | Freq: Once | INTRAMUSCULAR | Status: AC
  Administered 2021-05-04: 80 mg via INTRAMUSCULAR

## 2021-05-04 MED ORDER — IPRATROPIUM-ALBUTEROL 0.5-2.5 (3) MG/3ML IN SOLN
3.0000 mL | Freq: Once | RESPIRATORY_TRACT | Status: AC
  Administered 2021-05-04: 3 mL via RESPIRATORY_TRACT

## 2021-05-04 MED ORDER — GABAPENTIN 300 MG PO CAPS: 600.0000 mg | ORAL_CAPSULE | Freq: Three times a day (TID) | ORAL | 1 refills | Status: DC

## 2021-05-04 MED ORDER — PREDNISONE 20 MG PO TABS: ORAL_TABLET | ORAL | 0 refills | Status: AC

## 2021-05-04 MED ORDER — CEFTRIAXONE SODIUM 1 G IJ SOLR
1.0000 g | Freq: Once | INTRAMUSCULAR | Status: AC
  Administered 2021-05-04: 1 g via INTRAMUSCULAR

## 2021-05-04 NOTE — Progress Notes (Unsigned)
Acute Office Visit  Subjective:    Patient ID: Anne [name], female    DOB: [birth year], 76 y.o.   MRN: [id number]  Chief Complaint  Patient presents with   Cough    HPI: Patient is a 76 year old white female with past medical history significant for COPD and lung term tobacco use who presents with complaints of cough, nasal congestion, sore throat, hoarseness, coughing, shortness of breath, chest tightness, muscle pain, and headaches.  This is been going on since Friday.  Patient also had a temperature to 101.  COVID test is negative.  Past Medical History:  Diagnosis Date   Abnormal MRI of the head 02/08/2014   Formatting of this note might be different from the original. History of abn MRI brain in 2015 due to concussion. Formatting of this note might be different from the original. Overview:  History of abn MRI brain in 2015 due to concussion.   Acquired absence of both breasts and nipples 06/08/2015   Asthma    daily inhalers   Breast cancer ([place]) 05/2015   left   Breast cancer of upper-inner quadrant of left female breast ([place]) 04/15/2015   COPD (chronic obstructive pulmonary disease) ([place])    denies SOB with ADLs; no O2   Dental crowns present    Depression    Family history of breast cancer in female 04/26/2015   Dx. Niece at 60; dx. Maternal aunt in her late 80s  Formatting of this note might be different from the original. Overview:  Dx. Niece at 95; dx. Maternal aunt in her late 41s   Family history of colon cancer 04/26/2015   Dx. In maternal grandfather in his late 18s-70  Formatting of this note might be different from the original. Overview:  Dx. In maternal grandfather in his late 35s-70   Genetic testing 06/02/2015   Negative for pathogenic mutations within any of 20 genes on the Breast/Ovarian Cancer Panel through [employer].  No variants of uncertain significance (VUSes) were found.  The Breast/Ovarian Cancer Panel offered by [facility] Hope Pigeon,  MD) includes sequencing and deletion/duplication analysis for the following 19 genes:  ATM, BARD1, BRCA1, BRCA2, BRIP1, CDH1, CHEK2, FANCC, M   History of basal cell carcinoma 04/26/2015   Dx. 68  Formatting of this note might be different from the original. Overview:  Dx. 68   History of concussion 01/01/2014   History of kidney stones    History of TIA (transient ischemic attack) 10/2014   Hyperlipidemia    Insomnia    Malrotation of intestine 09/23/2017   Migraine    Nasal congestion 05/26/2015   will finish z-pak 05/27/2015   Nonproductive cough 05/26/2015   PONV (postoperative nausea and vomiting)    "long time ago"  none recently   Psychogenic syncope 04/23/2019   RLS (restless legs syndrome)    Tobacco abuse    Vitamin D deficiency    Wears dentures    lower   Wears partial dentures    upper   Weight loss 08/28/2019    Past Surgical History:  Procedure Laterality Date   ABDOMINAL HYSTERECTOMY     APPENDECTOMY  2000   BREAST BIOPSY Left    BREAST RECONSTRUCTION WITH PLACEMENT OF TISSUE EXPANDER AND FLEX HD (ACELLULAR HYDRATED DERMIS) Bilateral 06/02/2015   Procedure: BILATERAL BREAST RECONSTRUCTION WITH PLACEMENT OF TISSUE EXPANDER AND  ACELLULAR HYDRATED DERMIS;  Surgeon: Irene Limbo, MD;  Location: [place];  Service: Plastics;  Laterality: Bilateral;  CARPAL TUNNEL RELEASE Bilateral    CATARACT EXTRACTION Bilateral 09/2013   KNEE ARTHROSCOPY Left    LAPAROSCOPIC ABDOMINAL EXPLORATION N/A 10/2017   LAPAROSCOPIC LYSIS OF CONGENITAL ADHESIVE BAND/LADD'S BANDS WIH INTRAOPERATIVE CHOLANGIOGRAPHY. Perforation of small intestion requiring repair.    LOOP RECORDER INSERTION N/A 05/11/2016   Procedure: Loop Recorder Insertion;  Surgeon: Will Meredith Leeds, MD;  Location: [facility];  Service: Cardiovascular;  Laterality: N/A;   MASTECTOMY W/ SENTINEL NODE BIOPSY Bilateral 06/02/2015   Procedure: BILATERAL MASTECTOMY WITH LEFT SENTINEL LYMPH NODE BIOPSY;   Surgeon: Stark Klein, MD;  Location: [place];  Service: General;  Laterality: Bilateral;   NASAL SEPTUM SURGERY     x 3   PLANTAR FASCIA SURGERY Left    REMOVAL OF BILATERAL TISSUE EXPANDERS WITH PLACEMENT OF BILATERAL BREAST IMPLANTS Bilateral 06/17/2015   Procedure: DEBRIDEMENT OF BILATERAL MASTECTOMY FLAP WITH BILATERAL TISSUE EXPANDER EXCHANGE ;  Surgeon: Irene Limbo, MD;  Location: [place];  Service: Plastics;  Laterality: Bilateral;   REMOVAL OF TISSUE EXPANDER Bilateral 07/12/2015   Procedure: REMOVAL OF BILATERAL TISSUE EXPANDERS;  Surgeon: Irene Limbo, MD;  Location: [place];  Service: Plastics;  Laterality: Bilateral;    Family History  Problem Relation Age of Onset   Cervical cancer Mother 71   Colon cancer Maternal Grandfather        mets to stomach; dx. 67-70   Heart disease Father    Prostate cancer Father 64   Cervical cancer Maternal Grandmother        dx. 88s; treated with radium implant   Lung cancer Sister 41       maternal half-sister dx. lung cancer, stage III   Breast cancer Maternal Aunt    Cervical cancer Sister 78       paternal half-sister; s/p TAH   Spina bifida Grandchild    Brain cancer Grandchild        grandson dx. at 20 mos, treated at [facility] Other 34       maternal half-sister's daughter   Cancer Maternal Aunt        d. 2; unspecified type - "began at back area and moved to vital organs"   Cancer Maternal Uncle         late 27s; unspecified type; "started at back and moved to vital organs"    Social History   Socioeconomic History   Marital status: Widowed    Spouse name: Not on file   Number of children: 1   Years of education: 59   Highest education level: Not on file  Occupational History    Comment: [employer], retired  Tobacco Use   Smoking status: Former    Packs/day: 0.25    Types: Cigarettes    Quit date: 03/21/2020    Years since quitting: 1.1   Smokeless  tobacco: Never  Vaping Use   Vaping Use: Never used  Substance and Sexual Activity   Alcohol use: Yes    Alcohol/week: 0.0 standard drinks    Comment: rarely   Drug use: No   Sexual activity: Not on file  Other Topics Concern   Not on file  Social History Narrative   Lives alone, widow   Caffeine use- tea, 1 cup daily   Right Handed    Lives in a one story [place] home    Social Determinants of Health   Financial Resource Strain: Low Risk    Difficulty of Paying  Living Expenses: Not hard at all  Food Insecurity: Not on file  Transportation Needs: No Transportation Needs   Lack of Transportation (Medical): No   Lack of Transportation (Non-Medical): No  Physical Activity: Not on file  Stress: Not on file  Social Connections: Not on file  Intimate Partner Violence: Not on file    Outpatient Medications Prior to Visit  Medication Sig Dispense Refill   albuterol (VENTOLIN HFA) 108 (90 Base) MCG/ACT inhaler INHALE 1 TO 2 PUFFS BY MOUTH EVERY 6 HOURS AS NEEDED FOR WHEEZING FOR SHORTNESS OF BREATH 18 g 12   amitriptyline (ELAVIL) 75 MG tablet TAKE 1 TABLET (75 MG TOTAL) BY MOUTH DAILY. 90 tablet 0   ARIPiprazole (ABILIFY) 15 MG tablet Take 1 tablet (15 mg total) by mouth daily. 30 tablet 0   Atogepant ([name]) 60 MG TABS Take 60 mg by mouth daily. (Patient not taking: Reported on 04/24/2021) 12 tablet 0   Atogepant ([name]) 60 MG TABS Take 60 mg by mouth daily. 90 tablet 3   cetirizine (ZYRTEC) 10 MG tablet Take 1 tablet (10 mg total) by mouth daily. 30 tablet 11   doxycycline (VIBRA-TABS) 100 MG tablet Take 100 mg by mouth 2 (two) times daily.     Fluticasone-Umeclidin-Vilant (TRELEGY ELLIPTA) 200-62.5-25 MCG/ACT AEPB Inhale 1 puff into the lungs daily. 3 each 3   furosemide (LASIX) 20 MG tablet Take 1 tablet (20 mg total) by mouth daily as needed for edema (for leg swelling). 90 tablet 1   HYDROcodone-acetaminophen (NORCO/VICODIN) 5-325 MG tablet Take 1 tablet by mouth daily as  needed for severe pain (migraines). 30 tablet 0   Melatonin 1 MG CAPS Take by mouth.     meloxicam (MOBIC) 15 MG tablet TAKE 1 TABLET EVERY DAY 90 tablet 1   mirtazapine (REMERON) 45 MG tablet TAKE 1 TABLET (45 MG TOTAL) BY MOUTH AT BEDTIME. 90 tablet 1   omeprazole (PRILOSEC) 40 MG capsule TAKE 1 CAPSULE TWICE DAILY 180 capsule 1   orphenadrine (NORFLEX) 100 MG tablet TAKE 1 TABLET (100 MG TOTAL) BY MOUTH 2 (TWO) TIMES DAILY AS NEEDED FOR MUSCLE SPASMS. 180 tablet 0   Plecanatide (TRULANCE) 3 MG TABS Take 1 tablet by mouth daily. (Patient not taking: Reported on 04/24/2021) 12 tablet 0   Plecanatide (TRULANCE) 3 MG TABS Take 1 tablet by mouth daily. 90 tablet 3   primidone (MYSOLINE) 50 MG tablet TAKE 1 TABLET BY MOUTH IN THE MORNING AND TAKE 1 TABLET AT BEDTIME 180 tablet 0   propranolol (INDERAL) 20 MG tablet Take 1 tablet (20 mg total) by mouth 2 (two) times daily. 180 tablet 1   rizatriptan (MAXALT-MLT) 10 MG disintegrating tablet      SUMAtriptan (IMITREX) 100 MG tablet May repeat once in 2 hours, if headache persists or recurs. No more than 2 in 24 hours. 10 tablet 3   traZODone (DESYREL) 100 MG tablet TAKE 1 TABLET (100 MG TOTAL) BY MOUTH AT BEDTIME AS NEEDED FOR SLEEP. 90 tablet 1   gabapentin (NEURONTIN) 300 MG capsule Take 2 capsules (600 mg total) by mouth 3 (three) times daily. 180 capsule 1   No facility-administered medications prior to visit.    Allergies  Allergen Reactions   Clindamycin/Lincomycin Nausea And Vomiting   Lyrica [Pregabalin] Other (See Comments)    PERSONALITY CHANGE    Review of Systems  Constitutional:  Positive for fatigue. Negative for chills and fever.  HENT:  Positive for congestion and rhinorrhea. Negative for sore throat.  Respiratory:  Positive for cough and shortness of breath.   Cardiovascular:  Negative for chest pain.  Gastrointestinal:  Negative for abdominal pain, constipation, diarrhea, nausea and vomiting.       No appetite    Genitourinary:  Negative for dysuria and urgency.  Musculoskeletal:  Positive for arthralgias and back pain. Negative for myalgias.  Neurological:  Positive for light-headedness and headaches. Negative for dizziness and weakness.       Balance issues (currently getting physical therapy).   Psychiatric/Behavioral:  Negative for dysphoric mood. The patient is not nervous/anxious.       Objective:    Physical Exam Vitals reviewed.  Constitutional:      Appearance: Normal appearance.  HENT:     Right Ear: Tympanic membrane, ear canal and external ear normal.     Left Ear: Tympanic membrane, ear canal and external ear normal.     Nose: Nose normal.     Comments: Sinus tenderness. Pale mucosa.     Mouth/Throat:     Pharynx: Oropharynx is clear.  Cardiovascular:     Rate and Rhythm: Normal rate and regular rhythm.     Heart sounds: Normal heart sounds. No murmur heard. Pulmonary:     Effort: Pulmonary effort is normal. No respiratory distress.     Breath sounds: Wheezing and rhonchi present.     Comments: Poor A/E. Neurological:     Mental Status: She is alert and oriented to person, place, and time.  Psychiatric:        Mood and Affect: Mood normal.        Behavior: Behavior normal.    BP (!) 104/52    Pulse 72    Temp (!) 97 F (36.1 C)    Resp 16    Ht 5' 1"  (1.549 m)    Wt 86 lb 3.2 oz (39.1 kg)    SpO2 95%    BMI 16.29 kg/m  Wt Readings from Last 3 Encounters:  05/04/21 86 lb 3.2 oz (39.1 kg)  04/10/21 91 lb 6.4 oz (41.5 kg)  03/08/21 96 lb (43.5 kg)    Health Maintenance Due  Topic Date Due   Hepatitis C Screening  Never done   Zoster Vaccines- Shingrix (1 of 2) Never done   COVID-19 Vaccine (3 - Moderna risk series) 03/29/2020   INFLUENZA VACCINE  10/03/2020    There are no preventive care reminders to display for this patient.   Lab Results  Component Value Date   TSH 1.100 01/30/2021   Lab Results  Component Value Date   WBC 7.9 02/14/2021   HGB 11.1  02/14/2021   HCT 33.8 (L) 02/14/2021   MCV 89 02/14/2021   PLT 230 02/14/2021   Lab Results  Component Value Date   NA 141 02/14/2021   K 4.1 02/14/2021   CO2 27 02/14/2021   GLUCOSE 82 02/14/2021   BUN 15 02/14/2021   CREATININE 0.74 02/14/2021   BILITOT <0.2 02/14/2021   ALKPHOS 126 (H) 02/14/2021   AST 25 02/14/2021   ALT 26 02/14/2021   PROT 5.5 (L) 02/14/2021   ALBUMIN 3.8 02/14/2021   CALCIUM 8.6 (L) 02/14/2021   ANIONGAP 3 (L) 07/03/2016   EGFR 85 02/14/2021   Lab Results  Component Value Date   CHOL 132 01/30/2021   Lab Results  Component Value Date   HDL 64 01/30/2021   Lab Results  Component Value Date   LDLCALC 45 01/30/2021   Lab Results  Component Value Date  TRIG 133 01/30/2021   Lab Results  Component Value Date   CHOLHDL 2.1 01/30/2021   No results found for: HGBA1C     Assessment & Plan:   Problem List Items Addressed This Visit       Respiratory   COPD with acute exacerbation ([place])    Shots given: Kenalog 80 mg IM and Rocephin 1 g IM Antibiotics: Augmentin 875 mg twice daily x10 days. Prednisone taper given. Continue DuoNeb 4 times a day for least the next 48 hours.  If persistent shortness of breath continue through the weekend. Continue Trelegy 1 inhalation daily.      Relevant Medications   triamcinolone acetonide (KENALOG-40) injection 80 mg   cefTRIAXone (ROCEPHIN) injection 1 g   predniSONE (DELTASONE) 20 MG tablet   amoxicillin-clavulanate (AUGMENTIN) 875-125 MG tablet   ipratropium-albuterol (DUONEB) 0.5-2.5 (3) MG/3ML nebulizer solution 3 mL   Acute infection of nasal sinus    Prescription for Augmentin given.      Relevant Medications   triamcinolone acetonide (KENALOG-40) injection 80 mg   cefTRIAXone (ROCEPHIN) injection 1 g   predniSONE (DELTASONE) 20 MG tablet   amoxicillin-clavulanate (AUGMENTIN) 875-125 MG tablet     Other   Acute cough - Primary    COVID-19 negative      Relevant Orders   POC  COVID-19 (Completed)   Meds ordered this encounter  Medications   triamcinolone acetonide (KENALOG-40) injection 80 mg   cefTRIAXone (ROCEPHIN) injection 1 g   predniSONE (DELTASONE) 20 MG tablet    Sig: Take 3 tablets (60 mg total) by mouth daily with breakfast for 3 days, THEN 2 tablets (40 mg total) daily with breakfast for 3 days, THEN 1 tablet (20 mg total) daily with breakfast for 3 days.    Dispense:  18 tablet    Refill:  0   amoxicillin-clavulanate (AUGMENTIN) 875-125 MG tablet    Sig: Take 1 tablet by mouth 2 (two) times daily.    Dispense:  20 tablet    Refill:  0   ipratropium-albuterol (DUONEB) 0.5-2.5 (3) MG/3ML nebulizer solution 3 mL   gabapentin (NEURONTIN) 300 MG capsule    Sig: Take 2 capsules (600 mg total) by mouth 3 (three) times daily.    Dispense:  180 capsule    Refill:  1    Orders Placed This Encounter  Procedures   POC COVID-19     Follow-up: Return if symptoms worsen or fail to improve OR GO TO ED over the weekend if worsening.Marland Kitchen  An After Visit Summary was printed and given to the patient.  [name], MD [facility] [phone]

## 2021-05-04 NOTE — Assessment & Plan Note (Signed)
Prescription for Augmentin given. ?

## 2021-05-04 NOTE — Assessment & Plan Note (Signed)
Shots given: Kenalog 80 mg IM and Rocephin 1 g IM ?Antibiotics: Augmentin 875 mg twice daily x10 days. ?Prednisone taper given. ?Continue DuoNeb 4 times a day for least the next 48 hours.  If persistent shortness of breath continue through the weekend. ?Continue Trelegy 1 inhalation daily. ?

## 2021-05-04 NOTE — Patient Instructions (Signed)
Shots given: Kenalog 80 mg IM and Rocephin 1 g IM ?Antibiotics: Augmentin 875 mg twice daily x10 days. ?Prednisone taper given. ?Continue DuoNeb 4 times a day for least the next 48 hours.  If persistent shortness of breath continue through the weekend. ?Continue Trelegy 1 inhalation daily. ?

## 2021-05-04 NOTE — Assessment & Plan Note (Signed)
COVID19 negative

## 2021-05-08 ENCOUNTER — Ambulatory Visit (INDEPENDENT_AMBULATORY_CARE_PROVIDER_SITE_OTHER): Payer: Medicare HMO | Admitting: Family Medicine

## 2021-05-08 DIAGNOSIS — J018 Other acute sinusitis: Secondary | ICD-10-CM

## 2021-05-08 NOTE — Progress Notes (Deleted)
Subjective:  Patient ID: Anne [name], female    DOB: 01/16/1946  Age: 76 y.o. MRN: [id number]  Chief Complaint  Patient presents with   Migraine    Follow up    Chronic Migraines follow up:       Current Outpatient Medications on File Prior to Visit  Medication Sig Dispense Refill   albuterol (VENTOLIN HFA) 108 (90 Base) MCG/ACT inhaler INHALE 1 TO 2 PUFFS BY MOUTH EVERY 6 HOURS AS NEEDED FOR WHEEZING FOR SHORTNESS OF BREATH 18 g 12   amitriptyline (ELAVIL) 75 MG tablet TAKE 1 TABLET (75 MG TOTAL) BY MOUTH DAILY. 90 tablet 0   amoxicillin-clavulanate (AUGMENTIN) 875-125 MG tablet Take 1 tablet by mouth 2 (two) times daily. 20 tablet 0   ARIPiprazole (ABILIFY) 15 MG tablet Take 1 tablet (15 mg total) by mouth daily. 30 tablet 0   Atogepant ([name]) 60 MG TABS Take 60 mg by mouth daily. (Patient not taking: Reported on 04/24/2021) 12 tablet 0   Atogepant ([name]) 60 MG TABS Take 60 mg by mouth daily. 90 tablet 3   cetirizine (ZYRTEC) 10 MG tablet Take 1 tablet (10 mg total) by mouth daily. 30 tablet 11   doxycycline (VIBRA-TABS) 100 MG tablet Take 100 mg by mouth 2 (two) times daily.     Fluticasone-Umeclidin-Vilant (TRELEGY ELLIPTA) 200-62.5-25 MCG/ACT AEPB Inhale 1 puff into the lungs daily. 3 each 3   furosemide (LASIX) 20 MG tablet Take 1 tablet (20 mg total) by mouth daily as needed for edema (for leg swelling). 90 tablet 1   gabapentin (NEURONTIN) 300 MG capsule Take 2 capsules (600 mg total) by mouth 3 (three) times daily. 180 capsule 1   HYDROcodone-acetaminophen (NORCO/VICODIN) 5-325 MG tablet Take 1 tablet by mouth daily as needed for severe pain (migraines). 30 tablet 0   Melatonin 1 MG CAPS Take by mouth.     meloxicam (MOBIC) 15 MG tablet TAKE 1 TABLET EVERY DAY 90 tablet 1   mirtazapine (REMERON) 45 MG tablet TAKE 1 TABLET (45 MG TOTAL) BY MOUTH AT BEDTIME. 90 tablet 1   omeprazole (PRILOSEC) 40 MG capsule TAKE 1 CAPSULE TWICE DAILY 180 capsule 1   orphenadrine  (NORFLEX) 100 MG tablet TAKE 1 TABLET (100 MG TOTAL) BY MOUTH 2 (TWO) TIMES DAILY AS NEEDED FOR MUSCLE SPASMS. 180 tablet 0   Plecanatide (TRULANCE) 3 MG TABS Take 1 tablet by mouth daily. (Patient not taking: Reported on 04/24/2021) 12 tablet 0   Plecanatide (TRULANCE) 3 MG TABS Take 1 tablet by mouth daily. 90 tablet 3   predniSONE (DELTASONE) 20 MG tablet Take 3 tablets (60 mg total) by mouth daily with breakfast for 3 days, THEN 2 tablets (40 mg total) daily with breakfast for 3 days, THEN 1 tablet (20 mg total) daily with breakfast for 3 days. 18 tablet 0   primidone (MYSOLINE) 50 MG tablet TAKE 1 TABLET BY MOUTH IN THE MORNING AND TAKE 1 TABLET AT BEDTIME 180 tablet 0   propranolol (INDERAL) 20 MG tablet Take 1 tablet (20 mg total) by mouth 2 (two) times daily. 180 tablet 1   rizatriptan (MAXALT-MLT) 10 MG disintegrating tablet      SUMAtriptan (IMITREX) 100 MG tablet May repeat once in 2 hours, if headache persists or recurs. No more than 2 in 24 hours. 10 tablet 3   traZODone (DESYREL) 100 MG tablet TAKE 1 TABLET (100 MG TOTAL) BY MOUTH AT BEDTIME AS NEEDED FOR SLEEP. 90 tablet 1   No current  facility-administered medications on file prior to visit.   Past Medical History:  Diagnosis Date   Abnormal MRI of the head 02/08/2014   Formatting of this note might be different from the original. History of abn MRI brain in 2015 due to concussion. Formatting of this note might be different from the original. Overview:  History of abn MRI brain in 2015 due to concussion.   Acquired absence of both breasts and nipples 06/08/2015   Asthma    daily inhalers   Breast cancer ([place]) 05/2015   left   Breast cancer of upper-inner quadrant of left female breast ([place]) 04/15/2015   COPD (chronic obstructive pulmonary disease) ([place])    denies SOB with ADLs; no O2   Dental crowns present    Depression    Family history of breast cancer in female 04/26/2015   Dx. Niece at 10; dx. Maternal aunt in her late 82s   Formatting of this note might be different from the original. Overview:  Dx. Niece at 51; dx. Maternal aunt in her late 5s   Family history of colon cancer 04/26/2015   Dx. In maternal grandfather in his late 63s-70  Formatting of this note might be different from the original. Overview:  Dx. In maternal grandfather in his late 47s-70   Genetic testing 06/02/2015   Negative for pathogenic mutations within any of 20 genes on the Breast/Ovarian Cancer Panel through [employer].  No variants of uncertain significance (VUSes) were found.  The Breast/Ovarian Cancer Panel offered by [facility] Hope Pigeon, MD) includes sequencing and deletion/duplication analysis for the following 19 genes:  ATM, BARD1, BRCA1, BRCA2, BRIP1, CDH1, CHEK2, FANCC, M   History of basal cell carcinoma 04/26/2015   Dx. 68  Formatting of this note might be different from the original. Overview:  Dx. 68   History of concussion 01/01/2014   History of kidney stones    History of TIA (transient ischemic attack) 10/2014   Hyperlipidemia    Insomnia    Malrotation of intestine 09/23/2017   Migraine    Nasal congestion 05/26/2015   will finish z-pak 05/27/2015   Nonproductive cough 05/26/2015   PONV (postoperative nausea and vomiting)    "long time ago"  none recently   Psychogenic syncope 04/23/2019   RLS (restless legs syndrome)    Tobacco abuse    Vitamin D deficiency    Wears dentures    lower   Wears partial dentures    upper   Weight loss 08/28/2019   Past Surgical History:  Procedure Laterality Date   ABDOMINAL HYSTERECTOMY     APPENDECTOMY  2000   BREAST BIOPSY Left    BREAST RECONSTRUCTION WITH PLACEMENT OF TISSUE EXPANDER AND FLEX HD (ACELLULAR HYDRATED DERMIS) Bilateral 06/02/2015   Procedure: BILATERAL BREAST RECONSTRUCTION WITH PLACEMENT OF TISSUE EXPANDER AND  ACELLULAR HYDRATED DERMIS;  Surgeon: Irene Limbo, MD;  Location: [facility];  Service: Plastics;  Laterality:  Bilateral;   CARPAL TUNNEL RELEASE Bilateral    CATARACT EXTRACTION Bilateral 09/2013   KNEE ARTHROSCOPY Left    LAPAROSCOPIC ABDOMINAL EXPLORATION N/A 10/2017   LAPAROSCOPIC LYSIS OF CONGENITAL ADHESIVE BAND/LADD'S BANDS WIH INTRAOPERATIVE CHOLANGIOGRAPHY. Perforation of small intestion requiring repair.    LOOP RECORDER INSERTION N/A 05/11/2016   Procedure: Loop Recorder Insertion;  Surgeon: Will Meredith Leeds, MD;  Location: [facility];  Service: Cardiovascular;  Laterality: N/A;   MASTECTOMY W/ SENTINEL NODE BIOPSY Bilateral 06/02/2015   Procedure: BILATERAL MASTECTOMY WITH LEFT SENTINEL LYMPH  NODE BIOPSY;  Surgeon: Stark Klein, MD;  Location: [facility];  Service: General;  Laterality: Bilateral;   NASAL SEPTUM SURGERY     x 3   PLANTAR FASCIA SURGERY Left    REMOVAL OF BILATERAL TISSUE EXPANDERS WITH PLACEMENT OF BILATERAL BREAST IMPLANTS Bilateral 06/17/2015   Procedure: DEBRIDEMENT OF BILATERAL MASTECTOMY FLAP WITH BILATERAL TISSUE EXPANDER EXCHANGE ;  Surgeon: Irene Limbo, MD;  Location: [facility];  Service: Plastics;  Laterality: Bilateral;   REMOVAL OF TISSUE EXPANDER Bilateral 07/12/2015   Procedure: REMOVAL OF BILATERAL TISSUE EXPANDERS;  Surgeon: Irene Limbo, MD;  Location: [facility];  Service: Plastics;  Laterality: Bilateral;    Family History  Problem Relation Age of Onset   Cervical cancer Mother 48   Colon cancer Maternal Grandfather        mets to stomach; dx. 67-70   Heart disease Father    Prostate cancer Father 103   Cervical cancer Maternal Grandmother        dx. 3s; treated with radium implant   Lung cancer Sister 5       maternal half-sister dx. lung cancer, stage III   Breast cancer Maternal Aunt    Cervical cancer Sister 82       paternal half-sister; s/p TAH   Spina bifida Grandchild    Brain cancer Grandchild        grandson dx. at 20 mos, treated at [facility] Other 90       maternal half-sister's  daughter   Cancer Maternal Aunt        d. 66; unspecified type - "began at back area and moved to vital organs"   Cancer Maternal Uncle         late 46s; unspecified type; "started at back and moved to vital organs"   Social History   Socioeconomic History   Marital status: Widowed    Spouse name: Not on file   Number of children: 1   Years of education: 10   Highest education level: Not on file  Occupational History    Comment: [employer], retired  Tobacco Use   Smoking status: Former    Packs/day: 0.25    Types: Cigarettes    Quit date: 03/21/2020    Years since quitting: 1.1   Smokeless tobacco: Never  Vaping Use   Vaping Use: Never used  Substance and Sexual Activity   Alcohol use: Yes    Alcohol/week: 0.0 standard drinks    Comment: rarely   Drug use: No   Sexual activity: Not on file  Other Topics Concern   Not on file  Social History Narrative   Lives alone, widow   Caffeine use- tea, 1 cup daily   Right Handed    Lives in a one story [place] home    Social Determinants of Health   Financial Resource Strain: Low Risk    Difficulty of Paying Living Expenses: Not hard at all  Food Insecurity: Not on file  Transportation Needs: No Transportation Needs   Lack of Transportation (Medical): No   Lack of Transportation (Non-Medical): No  Physical Activity: Not on file  Stress: Not on file  Social Connections: Not on file    Review of Systems  Constitutional:  Negative for appetite change, fatigue and fever.  HENT:  Negative for congestion, ear pain, sinus pressure and sore throat.   Eyes:  Negative for pain.  Respiratory:  Negative for cough, chest tightness, shortness  of breath and wheezing.   Cardiovascular:  Negative for chest pain and palpitations.  Gastrointestinal:  Negative for abdominal pain, constipation, diarrhea, nausea and vomiting.  Genitourinary:  Negative for dysuria and hematuria.  Musculoskeletal:  Negative for arthralgias, back pain,  joint swelling and myalgias.  Skin:  Negative for rash.  Neurological:  Negative for dizziness, weakness and headaches.  Psychiatric/Behavioral:  Negative for dysphoric mood. The patient is not nervous/anxious.     Objective:  There were no vitals taken for this visit.  BP/Weight 05/04/2021 10/07/1322 4/0/1027  Systolic BP 253 664 403  Diastolic BP 52 50 69  Wt. (Lbs) 86.2 91.4 96  BMI 16.29 17.85 18.75    Physical Exam Vitals reviewed.  Constitutional:      Appearance: Normal appearance. She is normal weight.  Neck:     Vascular: No carotid bruit.  Cardiovascular:     Rate and Rhythm: Normal rate and regular rhythm.     Heart sounds: Normal heart sounds.  Pulmonary:     Effort: Pulmonary effort is normal. No respiratory distress.     Breath sounds: Normal breath sounds.  Abdominal:     General: Abdomen is flat. Bowel sounds are normal.     Palpations: Abdomen is soft.     Tenderness: There is no abdominal tenderness.  Neurological:     Mental Status: She is alert and oriented to person, place, and time.  Psychiatric:        Mood and Affect: Mood normal.        Behavior: Behavior normal.    Diabetic Foot Exam - Simple   No data filed      Lab Results  Component Value Date   WBC 7.9 02/14/2021   HGB 11.1 02/14/2021   HCT 33.8 (L) 02/14/2021   PLT 230 02/14/2021   GLUCOSE 82 02/14/2021   CHOL 132 01/30/2021   TRIG 133 01/30/2021   HDL 64 01/30/2021   LDLCALC 45 01/30/2021   ALT 26 02/14/2021   AST 25 02/14/2021   NA 141 02/14/2021   K 4.1 02/14/2021   CL 105 02/14/2021   CREATININE 0.74 02/14/2021   BUN 15 02/14/2021   CO2 27 02/14/2021   TSH 1.100 01/30/2021      Assessment & Plan:   Problem List Items Addressed This Visit       Cardiovascular and Mediastinum   Chronic migraine without aura, intractable, without status migrainosus - Primary  .  No orders of the defined types were placed in this encounter.   No orders of the defined types were  placed in this encounter.    Follow-up: No follow-ups on file.  An After Visit Summary was printed and given to the patient.   I,Jozef Eisenbeis M Makhiya Coburn,acting as a scribe for [name], MD.,have documented all relevant documentation on the behalf of [name], MD,as directed by  [name], MD while in the presence of [name], MD.    [name], MD [place] [phone]

## 2021-05-08 NOTE — Therapy (Signed)
OUTPATIENT PHYSICAL THERAPY TREATMENT NOTE   Patient Name: Anne [name] MRN: [id number] DOB:1946-01-01, 76 y.o., female Today's Date: 05/09/2021  PCP: [name], MD REFERRING PROVIDER: [name], MD   PT End of Session - 05/09/21 1534     Visit Number 4    Number of Visits 6    Date for PT Re-Evaluation 05/16/21    Authorization Type [employer] MCR    Authorization Time Period 04/10/2021 - 05/26/2021    Authorization - Visit Number 3    Authorization - Number of Visits 12    PT Start Time 0762    PT Stop Time 1600    PT Time Calculation (min) 30 min    Activity Tolerance Patient tolerated treatment well    Behavior During Therapy [facility] for tasks assessed/performed               Past Medical History:  Diagnosis Date   Abnormal MRI of the head 02/08/2014   Formatting of this note might be different from the original. History of abn MRI brain in 2015 due to concussion. Formatting of this note might be different from the original. Overview:  History of abn MRI brain in 2015 due to concussion.   Acquired absence of both breasts and nipples 06/08/2015   Asthma    daily inhalers   Breast cancer ([place]) 05/2015   left   Breast cancer of upper-inner quadrant of left female breast ([place]) 04/15/2015   COPD (chronic obstructive pulmonary disease) ([place])    denies SOB with ADLs; no O2   Dental crowns present    Depression    Family history of breast cancer in female 04/26/2015   Dx. Niece at 16; dx. Maternal aunt in her late 44s  Formatting of this note might be different from the original. Overview:  Dx. Niece at 10; dx. Maternal aunt in her late 73s   Family history of colon cancer 04/26/2015   Dx. In maternal grandfather in his late 5s-70  Formatting of this note might be different from the original. Overview:  Dx. In maternal grandfather in his late 38s-70   Genetic testing 06/02/2015   Negative for pathogenic mutations within any of 20 genes on the Breast/Ovarian Cancer Panel through  [employer].  No variants of uncertain significance (VUSes) were found.  The Breast/Ovarian Cancer Panel offered by [facility] Hope Pigeon, MD) includes sequencing and deletion/duplication analysis for the following 19 genes:  ATM, BARD1, BRCA1, BRCA2, BRIP1, CDH1, CHEK2, FANCC, M   History of basal cell carcinoma 04/26/2015   Dx. 68  Formatting of this note might be different from the original. Overview:  Dx. 68   History of concussion 01/01/2014   History of kidney stones    History of TIA (transient ischemic attack) 10/2014   Hyperlipidemia    Insomnia    Malrotation of intestine 09/23/2017   Migraine    Nasal congestion 05/26/2015   will finish z-pak 05/27/2015   Nonproductive cough 05/26/2015   PONV (postoperative nausea and vomiting)    "long time ago"  none recently   Psychogenic syncope 04/23/2019   RLS (restless legs syndrome)    Tobacco abuse    Vitamin D deficiency    Wears dentures    lower   Wears partial dentures    upper   Weight loss 08/28/2019   Past Surgical History:  Procedure Laterality Date   ABDOMINAL HYSTERECTOMY     APPENDECTOMY  2000   BREAST BIOPSY Left    BREAST  RECONSTRUCTION WITH PLACEMENT OF TISSUE EXPANDER AND FLEX HD (ACELLULAR HYDRATED DERMIS) Bilateral 06/02/2015   Procedure: BILATERAL BREAST RECONSTRUCTION WITH PLACEMENT OF TISSUE EXPANDER AND  ACELLULAR HYDRATED DERMIS;  Surgeon: Irene Limbo, MD;  Location: [facility];  Service: Plastics;  Laterality: Bilateral;   CARPAL TUNNEL RELEASE Bilateral    CATARACT EXTRACTION Bilateral 09/2013   KNEE ARTHROSCOPY Left    LAPAROSCOPIC ABDOMINAL EXPLORATION N/A 10/2017   LAPAROSCOPIC LYSIS OF CONGENITAL ADHESIVE BAND/LADD'S BANDS WIH INTRAOPERATIVE CHOLANGIOGRAPHY. Perforation of small intestion requiring repair.    LOOP RECORDER INSERTION N/A 05/11/2016   Procedure: Loop Recorder Insertion;  Surgeon: Will Meredith Leeds, MD;  Location: [facility];  Service:  Cardiovascular;  Laterality: N/A;   MASTECTOMY W/ SENTINEL NODE BIOPSY Bilateral 06/02/2015   Procedure: BILATERAL MASTECTOMY WITH LEFT SENTINEL LYMPH NODE BIOPSY;  Surgeon: Stark Klein, MD;  Location: [facility];  Service: General;  Laterality: Bilateral;   NASAL SEPTUM SURGERY     x 3   PLANTAR FASCIA SURGERY Left    REMOVAL OF BILATERAL TISSUE EXPANDERS WITH PLACEMENT OF BILATERAL BREAST IMPLANTS Bilateral 06/17/2015   Procedure: DEBRIDEMENT OF BILATERAL MASTECTOMY FLAP WITH BILATERAL TISSUE EXPANDER EXCHANGE ;  Surgeon: Irene Limbo, MD;  Location: [facility];  Service: Plastics;  Laterality: Bilateral;   REMOVAL OF TISSUE EXPANDER Bilateral 07/12/2015   Procedure: REMOVAL OF BILATERAL TISSUE EXPANDERS;  Surgeon: Irene Limbo, MD;  Location: [facility];  Service: Plastics;  Laterality: Bilateral;   Patient Active Problem List   Diagnosis Date Noted   Acute infection of nasal sinus 05/04/2021   Drug induced constipation 04/10/2021   EIC (epidermal inclusion cyst) 03/21/2021   Acute migraine 01/30/2021   Aortic atherosclerosis ([place]) 01/30/2021   Bilateral carpal tunnel syndrome 01/30/2021   Chronic pain in right shoulder 12/25/2020   Chronic migraine without aura, intractable, without status migrainosus 12/25/2020   Memory loss 10/18/2020   Ataxia 10/18/2020   Frequent falls 10/18/2020   Tremor 10/18/2020   Moderate recurrent major depression ([place]) 10/18/2020   Lumbar back pain 10/18/2020   Acute cough 12/08/2019   Simple chronic bronchitis ([place]) 07/04/2019   Right lumbar radiculopathy 04/23/2019   Asthma 09/13/2017   COPD with acute exacerbation ([place]) 07/03/2016   Hyperlipidemia 07/03/2016   Tobacco abuse    Sleep disorder 07/02/2016   Carotid artery disease ([place]) 05/04/2016   Migraine aura without headache 02/08/2014    REFERRING PROVIDER: Alda Berthold, DO   REFERRING DIAG: Falls frequently  THERAPY DIAG:  Repeated falls  Muscle  weakness (generalized)  PERTINENT HISTORY: Frequent falls, history of left breast cancer s/p double mastectomy (2017), depression/anxiety, COPD with asthma, and migraine   PRECAUTIONS: Fall  SUBJECTIVE: Patient reports that she is dealing with pneumonia and since she missed last visit she felt like she had to come in this visit. She states she has seen the doctor for this but does not have any follow up appointments scheduled.  PAIN:  Are you having pain? No NPRS scale: N/A Pain location: N/A Pain orientation: N/A PAIN TYPE: N/A Pain description: N/A Aggravating factors: N/A Relieving factors: N/A  PATIENT GOALS: Improve balance, fall less   OBJECTIVE:  PATIENT SURVEYS:  FOTO 60% functional status (predicted 60%)   LE MMT:   MMT Right 04/04/2021 Left 04/04/2021  Hip flexion 4- 4-  Hip extension 3 3  Hip abduction 3 3  Knee flexion 4 4  Knee extension 4+ 4+    FUNCTIONAL TESTS:  5 times sit to stand: 10 seconds Berg Balance Scale: 45 TUG: 9 seconds   GAIT: Assistive device utilized: None Level of assistance: Complete Independence Comments: Narrow stance, occasionally with unsteady gait especially with turns      TODAY'S TREATMENT: 05/09/2021:  Therapeutic Exercise: Recumbent bike L1 x 5 min while taking subjective LAQ 2# 2 x 10 each Seated hamstring curl with yellow 2 x 10 each Seated march with 2# 2 x 10 Seated clamshell with yellow 2 x 10 Seated adductor ball squeeze 2 x 10 Sit to stand 2 x 10   04/26/2021:  Therapeutic Exercise: NuStep L3 x 5 min with LE while taking subjective SLR x 10 Bridge x 10 Sidelying hip abduction 2 x 10 LAQ 2# 2 x 15 Sit to stand with 6# at chest 2 x 10 Neuro Re-ed: 3/4 tandem on Airex 2 x 30 sec each Tandem stance 3 x 30 sec each Rocker board with forward/backward taps 2 x 20 Romberg stance on Airex with head turns x 10, nods x 10, alternating forward reach x 10  04/20/2021:  Therapeutic Exercise: NuStep L5 x 5 min with  LE while taking subjective Bridge 2 x 10 SLR 2 x 10 each Supine clamshell with red 2 x 10 Sidelying hip abduction 2 x 10 each LAQ with 2# 2 x 10 each Seated hamstring curl with red 2 x 10 each Sit to stand without UE support 2 x 10 Heel raises 2 x 10 Neuro Re-ed: Romberg on Airex 2 x 60 sec Tandem stance 3 x 30 sec each Romberg stance on Airex with head turns x 10, nods x 10   PATIENT EDUCATION:  Education details: HEP Person educated: Patient Education method: Consulting civil engineer, Demonstration, Corporate treasurer cues, Verbal cues Education comprehension: verbalized understanding, returned demonstration, verbal cues required, tactile cues required, and needs further education   HOME EXERCISE PROGRAM: Access Code: [email or web address]     ASSESSMENT: CLINICAL IMPRESSION: Patient with fair tolerance for therapy due to difficulty breathing and decreased endurance, with no adverse effects reported. Therapy mainly focused on seated strengthening exercises as patient unable to be supine. Therapy was shortened due to patient's condition and she was encouraged to follow up with her doctor as needed. No changes made to HEP. Patient would benefit from continued skilled PT to progress strength and balance in order to improve gait and reduce fall risk.  Objective impairments include decreased balance and decreased strength.     GOALS: Goals reviewed with patient? Yes   SHORT TERM GOALS:   STG Name Target Date Goal status  1 Patient will be I with initial HEP in order to progress with therapy. Baseline: has not been consistent with exercises due to illness 04/25/2021 ONGOING    LONG TERM GOALS:    LTG Name Target Date Goal status  1 Patient will be I with final HEP to maintain progress from PT. Baseline: provided at eval 05/16/2021 INITIAL  2 Patient will demonstrate BERG balance scale >/= 51/56 to indicate improved balance and reduced fall risk Baseline: 45/56 05/16/2021 INITIAL  3 Patient will demonstrate  improve hip strength grossly >/= 4-/5 MMT in order to improve control and ability to correct balance Baseline: grossly 3/5 MMT 05/16/2021 INITIAL  4 Patient will report no limitation in activity level as a result of balance impairment or falls Baseline: patient currently with functional limitations as a result of balance impairment 05/16/2021 INITIAL      PLAN: PT FREQUENCY: 1x/week   PT DURATION: 6 weeks  PLANNED INTERVENTIONS: Therapeutic exercises, Therapeutic activity, Neuro Muscular re-education, Balance training, Gait training, Patient/Family education, Joint mobilization, Stair training, Aquatic Therapy, Dry Needling, and Manual therapy   PLAN FOR NEXT SESSION: Review HEP and progress PRN, continue LE strengthening and balance training, progress dynamic balance    Hilda Blades, PT, DPT, LAT, ATC 05/09/21  4:02 PM Phone: [phone] Fax: [phone]

## 2021-05-09 ENCOUNTER — Ambulatory Visit: Payer: Medicare HMO | Attending: Neurology | Admitting: Physical Therapy

## 2021-05-09 ENCOUNTER — Encounter: Payer: Self-pay | Admitting: Physical Therapy

## 2021-05-09 ENCOUNTER — Other Ambulatory Visit: Payer: Self-pay

## 2021-05-09 DIAGNOSIS — R296 Repeated falls: Secondary | ICD-10-CM | POA: Insufficient documentation

## 2021-05-09 DIAGNOSIS — M6281 Muscle weakness (generalized): Secondary | ICD-10-CM | POA: Diagnosis not present

## 2021-05-13 NOTE — Therapy (Signed)
OUTPATIENT PHYSICAL THERAPY TREATMENT NOTE   Patient Name: Anne [name] MRN: [id number] DOB:22-Dec-1945, 76 y.o., female Today's Date: 05/15/2021  PCP: No primary care provider on file. REFERRING PROVIDER: Alda Berthold, DO   PT End of Session - 05/15/21 1540     Visit Number 5    Number of Visits 13    Date for PT Re-Evaluation 07/10/21    Authorization Type [employer] MCR    Authorization Time Period 04/10/2021 - 05/26/2021    Authorization - Visit Number 4    Authorization - Number of Visits 12    PT Start Time 1779    PT Stop Time 1615    PT Time Calculation (min) 45 min    Activity Tolerance Patient tolerated treatment well    Behavior During Therapy [facility] for tasks assessed/performed                Past Medical History:  Diagnosis Date   Abnormal MRI of the head 02/08/2014   Formatting of this note might be different from the original. History of abn MRI brain in 2015 due to concussion. Formatting of this note might be different from the original. Overview:  History of abn MRI brain in 2015 due to concussion.   Acquired absence of both breasts and nipples 06/08/2015   Asthma    daily inhalers   Breast cancer ([place]) 05/2015   left   Breast cancer of upper-inner quadrant of left female breast ([place]) 04/15/2015   COPD (chronic obstructive pulmonary disease) ([place])    denies SOB with ADLs; no O2   Dental crowns present    Depression    Family history of breast cancer in female 04/26/2015   Dx. Niece at 6; dx. Maternal aunt in her late 42s  Formatting of this note might be different from the original. Overview:  Dx. Niece at 76; dx. Maternal aunt in her late 31s   Family history of colon cancer 04/26/2015   Dx. In maternal grandfather in his late 42s-70  Formatting of this note might be different from the original. Overview:  Dx. In maternal grandfather in his late 53s-70   Genetic testing 06/02/2015   Negative for pathogenic mutations within any of 20 genes on the  Breast/Ovarian Cancer Panel through [employer].  No variants of uncertain significance (VUSes) were found.  The Breast/Ovarian Cancer Panel offered by [facility] Hope Pigeon, MD) includes sequencing and deletion/duplication analysis for the following 19 genes:  ATM, BARD1, BRCA1, BRCA2, BRIP1, CDH1, CHEK2, FANCC, M   History of basal cell carcinoma 04/26/2015   Dx. 68  Formatting of this note might be different from the original. Overview:  Dx. 68   History of concussion 01/01/2014   History of kidney stones    History of TIA (transient ischemic attack) 10/2014   Hyperlipidemia    Insomnia    Malrotation of intestine 09/23/2017   Migraine    Nasal congestion 05/26/2015   will finish z-pak 05/27/2015   Nonproductive cough 05/26/2015   PONV (postoperative nausea and vomiting)    "long time ago"  none recently   Psychogenic syncope 04/23/2019   RLS (restless legs syndrome)    Tobacco abuse    Vitamin D deficiency    Wears dentures    lower   Wears partial dentures    upper   Weight loss 08/28/2019   Past Surgical History:  Procedure Laterality Date   ABDOMINAL HYSTERECTOMY     APPENDECTOMY  2000   BREAST BIOPSY  Left    BREAST RECONSTRUCTION WITH PLACEMENT OF TISSUE EXPANDER AND FLEX HD (ACELLULAR HYDRATED DERMIS) Bilateral 06/02/2015   Procedure: BILATERAL BREAST RECONSTRUCTION WITH PLACEMENT OF TISSUE EXPANDER AND  ACELLULAR HYDRATED DERMIS;  Surgeon: Irene Limbo, MD;  Location: [place];  Service: Plastics;  Laterality: Bilateral;   CARPAL TUNNEL RELEASE Bilateral    CATARACT EXTRACTION Bilateral 09/2013   KNEE ARTHROSCOPY Left    LAPAROSCOPIC ABDOMINAL EXPLORATION N/A 10/2017   LAPAROSCOPIC LYSIS OF CONGENITAL ADHESIVE BAND/LADD'S BANDS WIH INTRAOPERATIVE CHOLANGIOGRAPHY. Perforation of small intestion requiring repair.    LOOP RECORDER INSERTION N/A 05/11/2016   Procedure: Loop Recorder Insertion;  Surgeon: Will Meredith Leeds, MD;  Location: [place] [facility];  Service: Cardiovascular;  Laterality: N/A;   MASTECTOMY W/ SENTINEL NODE BIOPSY Bilateral 06/02/2015   Procedure: BILATERAL MASTECTOMY WITH LEFT SENTINEL LYMPH NODE BIOPSY;  Surgeon: Stark Klein, MD;  Location: [place];  Service: General;  Laterality: Bilateral;   NASAL SEPTUM SURGERY     x 3   PLANTAR FASCIA SURGERY Left    REMOVAL OF BILATERAL TISSUE EXPANDERS WITH PLACEMENT OF BILATERAL BREAST IMPLANTS Bilateral 06/17/2015   Procedure: DEBRIDEMENT OF BILATERAL MASTECTOMY FLAP WITH BILATERAL TISSUE EXPANDER EXCHANGE ;  Surgeon: Irene Limbo, MD;  Location: [place];  Service: Plastics;  Laterality: Bilateral;   REMOVAL OF TISSUE EXPANDER Bilateral 07/12/2015   Procedure: REMOVAL OF BILATERAL TISSUE EXPANDERS;  Surgeon: Irene Limbo, MD;  Location: [place];  Service: Plastics;  Laterality: Bilateral;   Patient Active Problem List   Diagnosis Date Noted   Acute infection of nasal sinus 05/04/2021   Drug induced constipation 04/10/2021   EIC (epidermal inclusion cyst) 03/21/2021   Acute migraine 01/30/2021   Aortic atherosclerosis ([place]) 01/30/2021   Bilateral carpal tunnel syndrome 01/30/2021   Chronic pain in right shoulder 12/25/2020   Chronic migraine without aura, intractable, without status migrainosus 12/25/2020   Memory loss 10/18/2020   Ataxia 10/18/2020   Frequent falls 10/18/2020   Tremor 10/18/2020   Moderate recurrent major depression ([place]) 10/18/2020   Lumbar back pain 10/18/2020   Acute cough 12/08/2019   Simple chronic bronchitis ([place]) 07/04/2019   Right lumbar radiculopathy 04/23/2019   Asthma 09/13/2017   COPD with acute exacerbation ([place]) 07/03/2016   Hyperlipidemia 07/03/2016   Tobacco abuse    Sleep disorder 07/02/2016   Carotid artery disease ([place]) 05/04/2016   Migraine aura without headache 02/08/2014    REFERRING PROVIDER: Alda Berthold, DO   REFERRING DIAG: Falls frequently  THERAPY DIAG:   Repeated falls  Muscle weakness (generalized)  PERTINENT HISTORY: Frequent falls, history of left breast cancer s/p double mastectomy (2017), depression/anxiety, COPD with asthma, and migraine   PRECAUTIONS: Fall  SUBJECTIVE: Patient reports she is doing better with her pneumonia but she is dealing with a migraine today. Overall, she feels her balance is better but does note that she has fallen since last visit due to her kitten getting tangled up under her feet.  PAIN:  Are you having pain? No NPRS scale: N/A Pain location: N/A Pain orientation: N/A PAIN TYPE: N/A Pain description: N/A Aggravating factors: N/A Relieving factors: N/A  PATIENT GOALS: Improve balance, fall less   OBJECTIVE:  PATIENT SURVEYS:  FOTO 60% functional status (predicted 60%)   LE MMT:   MMT Right 04/04/2021 Left 04/04/2021 Rt / Lt 05/15/2021  Hip flexion 4- 4- 4- / 4-  Hip extension 3 3 3+ / 3+  Hip abduction 3  3 3+ / 3+  Knee flexion 4 4 4+ / 4+  Knee extension 4+ 4+ 4+ / 4+    FUNCTIONAL TESTS:  5 times sit to stand: 10 seconds Berg Balance Scale: 48 (see below) TUG: 9 seconds   GAIT: Assistive device utilized: None Level of assistance: Complete Independence Comments: Narrow stance, occasionally with unsteady gait especially with turns      TODAY'S TREATMENT: 05/15/2021: Therapeutic Exercise: NuStep L4 x 6 min with LE while taking subjective LTR x 10 SLR 2 x 5 each Bridge 2 x 5 Hooklying clamshell 2 x 10 Sidelying hip abduction 2 x 5 each LAQ 3# 2 x 10 each Seated march 2 x 20 Seated hamstring curl with red 2 x 5 each Sit to stand 2 x 10 - hands on thighs Neuro Re-ed: Tandem stance 3 x 30 sec each Rockerboard fwd/bwd taps 2 x 1 min   05/09/2021:  Therapeutic Exercise: Recumbent bike L1 x 5 min while taking subjective LAQ 2# 2 x 10 each Seated hamstring curl with yellow 2 x 10 each Seated march with 2# 2 x 10 Seated clamshell with yellow 2 x 10 Seated adductor ball  squeeze 2 x 10 Sit to stand 2 x 10  04/26/2021:  Therapeutic Exercise: NuStep L3 x 5 min with LE while taking subjective SLR x 10 Bridge x 10 Sidelying hip abduction 2 x 10 LAQ 2# 2 x 15 Sit to stand with 6# at chest 2 x 10 Neuro Re-ed: 3/4 tandem on Airex 2 x 30 sec each Tandem stance 3 x 30 sec each Rocker board with forward/backward taps 2 x 20 Romberg stance on Airex with head turns x 10, nods x 10, alternating forward reach x 10   PATIENT EDUCATION:  Education details: POC extension, HEP Person educated: Patient Education method: Explanation, Demonstration, Tactile cues, Verbal cues Education comprehension: verbalized understanding, returned demonstration, verbal cues required, tactile cues required, and needs further education   HOME EXERCISE PROGRAM: Access Code: [email or web address]     ASSESSMENT: CLINICAL IMPRESSION: Patient with better tolerance for therapy this visit, with no adverse effects. Overall she demonstrates improvement in her strength balance, but continues to have falls due to her cat and has missed several visits throughout plan of care due to other illnesses. Therapy continues to focus on improving her strength and balance in order to reduce falls and improve safety. She seems to be progressing well with therapy so will extend her POC for 8 weeks at frequency of 1x/week. Patient would benefit from continued skilled PT to progress strength and balance in order to improve gait and reduce fall risk.  Objective impairments include decreased balance and decreased strength.     GOALS: Goals reviewed with patient? Yes   SHORT TERM GOALS:   STG Name Target Date Goal status  1 Patient will be I with initial HEP in order to progress with therapy. Baseline: has not been consistent with exercises due to illness 05/15/2021: patient reports independence with initial HEP 04/25/2021 ACHIEVED    LONG TERM GOALS:    LTG Name Target Date Goal status  1 Patient will be I with  final HEP to maintain progress from PT. Baseline: provided at eval 05/15/2021: progressing HEP 07/10/2021 ONGOING  2 Patient will demonstrate BERG balance scale >/= 51/56 to indicate improved balance and reduced fall risk Baseline: 45/56 05/15/2021: 48/56 07/10/2021 PARTIALLY MET  3 Patient will demonstrate improve hip strength grossly >/= 4-/5 MMT in order to improve control and ability to  correct balance Baseline: grossly 3/5 MMT 05/15/2021: grossly 3+/5 MMT 07/10/2021 PARTIALLY MET  4 Patient will report no limitation in activity level as a result of balance impairment or falls Baseline: patient currently with functional limitations as a result of balance impairment 05/15/2021: patient currently with functional limitations as a result of balance impairment 07/10/2021 ONGOING      PLAN: PT FREQUENCY: 1x/week   PT DURATION: 8 weeks   PLANNED INTERVENTIONS: Therapeutic exercises, Therapeutic activity, Neuro Muscular re-education, Balance training, Gait training, Patient/Family education, Joint mobilization, Stair training, Aquatic Therapy, Dry Needling, and Manual therapy   PLAN FOR NEXT SESSION: Review HEP and progress PRN, continue LE strengthening and balance training, progress dynamic balance    Hilda Blades, PT, DPT, LAT, ATC 05/15/21  4:22 PM Phone: [phone] Fax: [phone]     BERG BALANCE Sitting to Standing: Numbers; 0-4: 4             4. Stands without using hands and stabilize independently             3. Stands independently using hands             2. Stands using hands after multiple trials             1. Min A to stand             0. Mod-Max A to stand Standing unsupported: Numbers; 0-4: 4             4. Stands safely for 2 minutes             3. Stands 2 minutes with supervision             2. Stands 30 seconds unsupported             1. Needs several tries to stand unsupported for 30 seconds             0. Unable to stand unsupported for 30 seconds Sitting  unsupported: Numbers; 0-4: 4             4. Sits for 2 minutes independently             3. Sits for 2 minutes with supervision             2. Able to sit 30 seconds             1. Able to sit 10 seconds             0. Unable to sit for 10 seconds Standing to Sitting: Numbers; 0-4: 4 4. Sits safely with minimal use of hands 3. Controls descent with hands 2. Uses back of legs against chair to control descent 1. Sits independently, but uncontrolled descent 0. Needs assistance Transfers: Numbers; 0-4: 4             4. Transfers safely with minor use of hands             3. Transfers safely definite use of hands             2. Transfers with verbal cueing/supervision             1. Needs 1 person assist             0. Needs 2 person assist  Standing with eyes closed: Numbers; 0-4: 4             4. Stands safely for 10 seconds  3. Stands 10 seconds with supervision              2. Able to stand for 3 seconds             1. Unable to keep eyes closed for 3 seconds, but is safe             0. Needs assist to keep from falling Standing with feet together: Numbers; 0-4: 4 4. Stands for 1 minute safely 3. Stands for 1 minute with supervision 2. Unable to hold for 30 seconds             1. Needs help to attain position but can hold for 15 seconds             0. Needs help to attain position and unable to hold for 15 seconds Reaching forward with outstretched arm: Numbers; 0-4: 3             4. Reaches forward 10 inches             3. Reaches forward 5 inches             2. Reaches forward 2 inches             1. Reaches forward with supervision             0. Loses balance/requires assistace Retrieving object from the floor: Numbers; 0-4: 4 4. Able to pick up easily and safely 3. Able to pick up with supervision 2. Unable to pick up, but reaches within 1-2 inches independently 1. Unable to pick up and needs supervision 0. Unable/needs assistance to keep from falling  Turning  to look behind: Numbers; 0-4: 4             4. Looks behind from both sides and weight shifts well             3. Looks behind one side only, other side less weight shift             2. Turns sideways only, maintains balance             1. Needs supervision when turning             0. Needs assistance  Turning 360 degrees: Numbers; 0-4: 4             4. Able to turn in </=4 seconds             3. Able to turn on one side in </= 4 seconds              2. Able to turn slowly, but safely             1. Needs supervision or verbal cueing             0. Needs assistance Place alternate foot on stool: Numbers; 0-4: 3 4. Completes 8 steps in 20 seconds 3. Completes 8 steps in >20 seconds 2. 4 steps without assistance/supervision 1. Completes >2 steps with minimal assist 0. Unable, needs assist to keep from falling Standing with one foot in front: Numbers; 0-4: 1             4. Independent tandem for 30 seconds             3. Independent foot ahead for 30 seconds             2. Independent small step for 30 seconds  1. Needs help to step, but can hold for 15 seconds             0. Loses balance while standing/stepping Standing on one foot: Numbers; 0-4: 1 4. Holds >10 seconds 3. Holds 5-10 seconds 2. Holds >/=3 seconds  1. Holds <3 seconds 0. Unable    Total Score: 48/56

## 2021-05-14 ENCOUNTER — Other Ambulatory Visit: Payer: Self-pay | Admitting: Physician Assistant

## 2021-05-15 ENCOUNTER — Encounter: Payer: Self-pay | Admitting: Physical Therapy

## 2021-05-15 ENCOUNTER — Ambulatory Visit: Payer: Medicare HMO | Admitting: Physical Therapy

## 2021-05-15 ENCOUNTER — Other Ambulatory Visit: Payer: Self-pay

## 2021-05-15 ENCOUNTER — Telehealth: Payer: Self-pay

## 2021-05-15 DIAGNOSIS — R296 Repeated falls: Secondary | ICD-10-CM | POA: Diagnosis not present

## 2021-05-15 DIAGNOSIS — M6281 Muscle weakness (generalized): Secondary | ICD-10-CM

## 2021-05-15 DIAGNOSIS — J441 Chronic obstructive pulmonary disease with (acute) exacerbation: Secondary | ICD-10-CM

## 2021-05-15 DIAGNOSIS — R634 Abnormal weight loss: Secondary | ICD-10-CM

## 2021-05-15 NOTE — Telephone Encounter (Signed)
Anne [name] called the office after receiving her dismissal letter.  I had given her a verbal warning last week after missing her 3rd appointment in 12 months and then she had called back saying that she was transferring her care.  When she called today she said that she was not planning on seeing a different physician.  She did not request a call back at this time.   ?

## 2021-05-16 ENCOUNTER — Other Ambulatory Visit: Payer: Self-pay

## 2021-05-16 ENCOUNTER — Other Ambulatory Visit: Payer: Self-pay | Admitting: Family Medicine

## 2021-05-16 MED ORDER — ALBUTEROL SULFATE HFA 108 (90 BASE) MCG/ACT IN AERS: INHALATION_SPRAY | RESPIRATORY_TRACT | 0 refills | Status: DC

## 2021-05-18 ENCOUNTER — Telehealth: Payer: Medicare HMO

## 2021-05-19 ENCOUNTER — Other Ambulatory Visit: Payer: Self-pay

## 2021-05-19 MED ORDER — RIZATRIPTAN BENZOATE 10 MG PO TBDP: ORAL_TABLET | ORAL | 0 refills | Status: DC

## 2021-05-20 NOTE — Progress Notes (Signed)
No showed

## 2021-05-22 NOTE — Therapy (Signed)
?OUTPATIENT PHYSICAL THERAPY TREATMENT NOTE ? ? ?Patient Name: Anne [name] ?MRN: [id number] ?DOB:1945/06/02, 76 y.o., female ?Today's Date: 05/23/2021 ? ?PCP: [name], MD ?REFERRING PROVIDER: [name], MD ? ? PT End of Session - 05/23/21 1527   ? ? Visit Number 6   ? Number of Visits 13   ? Date for PT Re-Evaluation 07/10/21   ? Authorization Type [employer] MCR   ? Authorization Time Period 04/10/2021 - 05/26/2021   ? Authorization - Visit Number 5   ? Authorization - Number of Visits 12   ? PT Start Time 1523   ? PT Stop Time 9323   ? PT Time Calculation (min) 42 min   ? Activity Tolerance Patient tolerated treatment well   ? Behavior During Therapy [facility] for tasks assessed/performed   ? ?  ?  ? ?  ? ? ? ? ? ? ?Past Medical History:  ?Diagnosis Date  ? Abnormal MRI of the head 02/08/2014  ? Formatting of this note might be different from the original. History of abn MRI brain in 2015 due to concussion. Formatting of this note might be different from the original. Overview:  History of abn MRI brain in 2015 due to concussion.  ? Acquired absence of both breasts and nipples 06/08/2015  ? Asthma   ? daily inhalers  ? Breast cancer ([place]) 05/2015  ? left  ? Breast cancer of upper-inner quadrant of left female breast ([place]) 04/15/2015  ? COPD (chronic obstructive pulmonary disease) ([place])   ? denies SOB with ADLs; no O2  ? Dental crowns present   ? Depression   ? Family history of breast cancer in female 04/26/2015  ? Dx. Niece at 8; dx. Maternal aunt in her late 48s  Formatting of this note might be different from the original. Overview:  Dx. Niece at 39; dx. Maternal aunt in her late 91s  ? Family history of colon cancer 04/26/2015  ? Dx. In maternal grandfather in his late 67s-70  Formatting of this note might be different from the original. Overview:  Dx. In maternal grandfather in his late 22s-70  ? Genetic testing 06/02/2015  ? Negative for pathogenic mutations within any of 20 genes on the Breast/Ovarian Cancer Panel  through [employer].  No variants of uncertain significance (VUSes) were found.  The Breast/Ovarian Cancer Panel offered by [facility] Hope Pigeon, MD) includes sequencing and deletion/duplication analysis for the following 19 genes:  ATM, BARD1, BRCA1, BRCA2, BRIP1, CDH1, CHEK2, FANCC, M  ? History of basal cell carcinoma 04/26/2015  ? Dx. 32  Formatting of this note might be different from the original. Overview:  Dx. 68  ? History of concussion 01/01/2014  ? History of kidney stones   ? History of TIA (transient ischemic attack) 10/2014  ? Hyperlipidemia   ? Insomnia   ? Malrotation of intestine 09/23/2017  ? Migraine   ? Nasal congestion 05/26/2015  ? will finish z-pak 05/27/2015  ? Nonproductive cough 05/26/2015  ? PONV (postoperative nausea and vomiting)   ? "long time ago"  none recently  ? Psychogenic syncope 04/23/2019  ? RLS (restless legs syndrome)   ? Tobacco abuse   ? Vitamin D deficiency   ? Wears dentures   ? lower  ? Wears partial dentures   ? upper  ? Weight loss 08/28/2019  ? ?Past Surgical History:  ?Procedure Laterality Date  ? ABDOMINAL HYSTERECTOMY    ? APPENDECTOMY  2000  ? BREAST BIOPSY Left   ?  BREAST RECONSTRUCTION WITH PLACEMENT OF TISSUE EXPANDER AND FLEX HD (ACELLULAR HYDRATED DERMIS) Bilateral 06/02/2015  ? Procedure: BILATERAL BREAST RECONSTRUCTION WITH PLACEMENT OF TISSUE EXPANDER AND  ACELLULAR HYDRATED DERMIS;  Surgeon: Irene Limbo, MD;  Location: [place];  Service: Plastics;  Laterality: Bilateral;  ? CARPAL TUNNEL RELEASE Bilateral   ? CATARACT EXTRACTION Bilateral 09/2013  ? KNEE ARTHROSCOPY Left   ? LAPAROSCOPIC ABDOMINAL EXPLORATION N/A 10/2017  ? LAPAROSCOPIC LYSIS OF CONGENITAL ADHESIVE BAND/LADD'S BANDS WIH INTRAOPERATIVE CHOLANGIOGRAPHY. Perforation of small intestion requiring repair.   ? LOOP RECORDER INSERTION N/A 05/11/2016  ? Procedure: Loop Recorder Insertion;  Surgeon: Will Meredith Leeds, MD;  Location: [place] [facility];  Service:  Cardiovascular;  Laterality: N/A;  ? MASTECTOMY W/ SENTINEL NODE BIOPSY Bilateral 06/02/2015  ? Procedure: BILATERAL MASTECTOMY WITH LEFT SENTINEL LYMPH NODE BIOPSY;  Surgeon: Stark Klein, MD;  Location: [place];  Service: General;  Laterality: Bilateral;  ? NASAL SEPTUM SURGERY    ? x 3  ? PLANTAR FASCIA SURGERY Left   ? REMOVAL OF BILATERAL TISSUE EXPANDERS WITH PLACEMENT OF BILATERAL BREAST IMPLANTS Bilateral 06/17/2015  ? Procedure: DEBRIDEMENT OF BILATERAL MASTECTOMY FLAP WITH BILATERAL TISSUE EXPANDER EXCHANGE ;  Surgeon: Irene Limbo, MD;  Location: [place];  Service: Plastics;  Laterality: Bilateral;  ? REMOVAL OF TISSUE EXPANDER Bilateral 07/12/2015  ? Procedure: REMOVAL OF BILATERAL TISSUE EXPANDERS;  Surgeon: Irene Limbo, MD;  Location: [place];  Service: Plastics;  Laterality: Bilateral;  ? ?Patient Active Problem List  ? Diagnosis Date Noted  ? Acute infection of nasal sinus 05/04/2021  ? Drug induced constipation 04/10/2021  ? EIC (epidermal inclusion cyst) 03/21/2021  ? Acute migraine 01/30/2021  ? Aortic atherosclerosis ([place]) 01/30/2021  ? Bilateral carpal tunnel syndrome 01/30/2021  ? Chronic pain in right shoulder 12/25/2020  ? Chronic migraine without aura, intractable, without status migrainosus 12/25/2020  ? Memory loss 10/18/2020  ? Ataxia 10/18/2020  ? Frequent falls 10/18/2020  ? Tremor 10/18/2020  ? Moderate recurrent major depression ([place]) 10/18/2020  ? Lumbar back pain 10/18/2020  ? Acute cough 12/08/2019  ? Simple chronic bronchitis ([place]) 07/04/2019  ? Right lumbar radiculopathy 04/23/2019  ? Asthma 09/13/2017  ? COPD with acute exacerbation ([place]) 07/03/2016  ? Hyperlipidemia 07/03/2016  ? Tobacco abuse   ? Sleep disorder 07/02/2016  ? Carotid artery disease ([place]) 05/04/2016  ? Migraine aura without headache 02/08/2014  ? ? ?REFERRING PROVIDER: Alda Berthold, DO ?  ?REFERRING DIAG: Falls frequently ? ?THERAPY DIAG:  ?Repeated falls ? ?Muscle  weakness (generalized) ? ?PERTINENT HISTORY: Frequent falls, history of left breast cancer s/p double mastectomy (2017), depression/anxiety, COPD with asthma, and migraine  ? ?PRECAUTIONS: Fall ? ?SUBJECTIVE: Patient reports she is doing better, but she does note that she has fallen once since last visit due to her cat running under her feet. ? ?PAIN:  ?Are you having pain? No ?NPRS scale: N/A ?Pain location: N/A ?Pain orientation: N/A ?PAIN TYPE: N/A ?Pain description: N/A ?Aggravating factors: N/A ?Relieving factors: N/A ? ?PATIENT GOALS: Improve balance, fall less ? ? ?OBJECTIVE:  ?PATIENT SURVEYS:  ?FOTO 60% functional status (predicted 60%) ?  ?LE MMT: ?  ?MMT Right ?04/04/2021 Left ?04/04/2021 Rt / Lt ?05/15/2021  ?Hip flexion 4- 4- 4- / 4-  ?Hip extension 3 3 3+ / 3+  ?Hip abduction 3 3 3+ / 3+  ?Knee flexion 4 4 4+ / 4+  ?Knee extension 4+ 4+ 4+ / 4+  ?  ?  FUNCTIONAL TESTS:  ?5 times sit to stand: 10 seconds ?Berg Balance Scale: 48 - 05/15/2021 ?TUG: 9 seconds ?  ?GAIT: ?Assistive device utilized: None ?Level of assistance: Complete Independence ?Comments: Narrow stance, occasionally with unsteady gait especially with turns  ?  ?  ?TODAY'S TREATMENT: ?05/23/2021: ?Therapeutic Exercise: ?NuStep L5 x 5 min with LE while taking subjective ?SLR 2 x 10 each ?Bridge 2 x 10 ?Hooklying clamshell 2 x 10 ?Sidelying hip abduction 2 x 10 each ?LAQ 3# 2 x 10 each ?Sit to stand x 10, hold 5# at chest x 10 ?Heel raises 2 x 20 ?Neuro Re-ed: ?1/2 Tandem on Airex 2 x 30 sec each ?Tandem stance 2 x 30 sec each ?Rockerboard fwd/bwd taps and lateral taps 2 x 1 min each ? ? ?05/15/2021: ?Therapeutic Exercise: ?NuStep L4 x 6 min with LE while taking subjective ?LTR x 10 ?SLR 2 x 5 each ?Bridge 2 x 5 ?Hooklying clamshell 2 x 10 ?Sidelying hip abduction 2 x 5 each ?LAQ 3# 2 x 10 each ?Seated march 2 x 20 ?Seated hamstring curl with red 2 x 5 each ?Sit to stand 2 x 10 - hands on thighs ?Neuro Re-ed: ?Tandem stance 3 x 30 sec  each ?Rockerboard fwd/bwd taps 2 x 1 min ? ?05/09/2021: ? Therapeutic Exercise: ?Recumbent bike L1 x 5 min while taking subjective ?LAQ 2# 2 x 10 each ?Seated hamstring curl with yellow 2 x 10 each ?Seated march with 2# 2 x 10

## 2021-05-23 ENCOUNTER — Other Ambulatory Visit: Payer: Self-pay

## 2021-05-23 ENCOUNTER — Encounter: Payer: Self-pay | Admitting: Physical Therapy

## 2021-05-23 ENCOUNTER — Ambulatory Visit: Payer: Medicare HMO | Admitting: Physical Therapy

## 2021-05-23 DIAGNOSIS — R296 Repeated falls: Secondary | ICD-10-CM

## 2021-05-23 DIAGNOSIS — M6281 Muscle weakness (generalized): Secondary | ICD-10-CM

## 2021-05-29 ENCOUNTER — Other Ambulatory Visit: Payer: Self-pay

## 2021-05-29 ENCOUNTER — Ambulatory Visit
Admission: RE | Admit: 2021-05-29 | Discharge: 2021-05-29 | Disposition: A | Discharge status: Home / Self Care | Payer: Medicare HMO | Source: Ambulatory Visit | Attending: Family Medicine | Admitting: Family Medicine

## 2021-05-29 DIAGNOSIS — R053 Chronic cough: Secondary | ICD-10-CM | POA: Diagnosis not present

## 2021-05-29 DIAGNOSIS — J9811 Atelectasis: Secondary | ICD-10-CM | POA: Diagnosis not present

## 2021-05-30 ENCOUNTER — Ambulatory Visit: Payer: Medicare HMO

## 2021-05-30 ENCOUNTER — Telehealth: Payer: Medicare HMO

## 2021-05-30 NOTE — Therapy (Incomplete)
?OUTPATIENT PHYSICAL THERAPY TREATMENT NOTE ? ? ?Patient Name: Anne [name] ?MRN: [id number] ?DOB:[birth year], 76 y.o., female ?Today's Date: 05/30/2021 ? ?PCP: [name], MD ?REFERRING PROVIDER: [name], MD ? ? ? ? ? ? ? ? ?Past Medical History:  ?Diagnosis Date  ? Abnormal MRI of the head 02/08/2014  ? Formatting of this note might be different from the original. History of abn MRI brain in 2015 due to concussion. Formatting of this note might be different from the original. Overview:  History of abn MRI brain in 2015 due to concussion.  ? Acquired absence of both breasts and nipples 06/08/2015  ? Asthma   ? daily inhalers  ? Breast cancer ([place]) 05/2015  ? left  ? Breast cancer of upper-inner quadrant of left female breast ([place]) 04/15/2015  ? COPD (chronic obstructive pulmonary disease) ([place])   ? denies SOB with ADLs; no O2  ? Dental crowns present   ? Depression   ? Family history of breast cancer in female 04/26/2015  ? Dx. Niece at 28; dx. Maternal aunt in her late 26s  Formatting of this note might be different from the original. Overview:  Dx. Niece at 56; dx. Maternal aunt in her late 63s  ? Family history of colon cancer 04/26/2015  ? Dx. In maternal grandfather in his late 39s-70  Formatting of this note might be different from the original. Overview:  Dx. In maternal grandfather in his late 69s-70  ? Genetic testing 06/02/2015  ? Negative for pathogenic mutations within any of 20 genes on the Breast/Ovarian Cancer Panel through [employer].  No variants of uncertain significance (VUSes) were found.  The Breast/Ovarian Cancer Panel offered by [facility] Hope Pigeon, MD) includes sequencing and deletion/duplication analysis for the following 19 genes:  ATM, BARD1, BRCA1, BRCA2, BRIP1, CDH1, CHEK2, FANCC, M  ? History of basal cell carcinoma 04/26/2015  ? Dx. 60  Formatting of this note might be different from the original. Overview:  Dx. 68  ? History of concussion 01/01/2014  ? History of  kidney stones   ? History of TIA (transient ischemic attack) 10/2014  ? Hyperlipidemia   ? Insomnia   ? Malrotation of intestine 09/23/2017  ? Migraine   ? Nasal congestion 05/26/2015  ? will finish z-pak 05/27/2015  ? Nonproductive cough 05/26/2015  ? PONV (postoperative nausea and vomiting)   ? "long time ago"  none recently  ? Psychogenic syncope 04/23/2019  ? RLS (restless legs syndrome)   ? Tobacco abuse   ? Vitamin D deficiency   ? Wears dentures   ? lower  ? Wears partial dentures   ? upper  ? Weight loss 08/28/2019  ? ?Past Surgical History:  ?Procedure Laterality Date  ? ABDOMINAL HYSTERECTOMY    ? APPENDECTOMY  2000  ? BREAST BIOPSY Left   ? BREAST RECONSTRUCTION WITH PLACEMENT OF TISSUE EXPANDER AND FLEX HD (ACELLULAR HYDRATED DERMIS) Bilateral 06/02/2015  ? Procedure: BILATERAL BREAST RECONSTRUCTION WITH PLACEMENT OF TISSUE EXPANDER AND  ACELLULAR HYDRATED DERMIS;  Surgeon: Irene Limbo, MD;  Location: [facility];  Service: Plastics;  Laterality: Bilateral;  ? CARPAL TUNNEL RELEASE Bilateral   ? CATARACT EXTRACTION Bilateral 09/2013  ? KNEE ARTHROSCOPY Left   ? LAPAROSCOPIC ABDOMINAL EXPLORATION N/A 10/2017  ? LAPAROSCOPIC LYSIS OF CONGENITAL ADHESIVE BAND/LADD'S BANDS WIH INTRAOPERATIVE CHOLANGIOGRAPHY. Perforation of small intestion requiring repair.   ? LOOP RECORDER INSERTION N/A 05/11/2016  ? Procedure: Loop Recorder Insertion;  Surgeon: Will Meredith Leeds, MD;  Location: [facility];  Service: Cardiovascular;  Laterality: N/A;  ? MASTECTOMY W/ SENTINEL NODE BIOPSY Bilateral 06/02/2015  ? Procedure: BILATERAL MASTECTOMY WITH LEFT SENTINEL LYMPH NODE BIOPSY;  Surgeon: Stark Klein, MD;  Location: [facility];  Service: General;  Laterality: Bilateral;  ? NASAL SEPTUM SURGERY    ? x 3  ? PLANTAR FASCIA SURGERY Left   ? REMOVAL OF BILATERAL TISSUE EXPANDERS WITH PLACEMENT OF BILATERAL BREAST IMPLANTS Bilateral 06/17/2015  ? Procedure: DEBRIDEMENT OF BILATERAL MASTECTOMY FLAP  WITH BILATERAL TISSUE EXPANDER EXCHANGE ;  Surgeon: Irene Limbo, MD;  Location: [facility];  Service: Plastics;  Laterality: Bilateral;  ? REMOVAL OF TISSUE EXPANDER Bilateral 07/12/2015  ? Procedure: REMOVAL OF BILATERAL TISSUE EXPANDERS;  Surgeon: Irene Limbo, MD;  Location: [facility];  Service: Plastics;  Laterality: Bilateral;  ? ?Patient Active Problem List  ? Diagnosis Date Noted  ? Acute infection of nasal sinus 05/04/2021  ? Drug induced constipation 04/10/2021  ? EIC (epidermal inclusion cyst) 03/21/2021  ? Acute migraine 01/30/2021  ? Aortic atherosclerosis ([place]) 01/30/2021  ? Bilateral carpal tunnel syndrome 01/30/2021  ? Chronic pain in right shoulder 12/25/2020  ? Chronic migraine without aura, intractable, without status migrainosus 12/25/2020  ? Memory loss 10/18/2020  ? Ataxia 10/18/2020  ? Frequent falls 10/18/2020  ? Tremor 10/18/2020  ? Moderate recurrent major depression ([place]) 10/18/2020  ? Lumbar back pain 10/18/2020  ? Acute cough 12/08/2019  ? Simple chronic bronchitis ([place]) 07/04/2019  ? Right lumbar radiculopathy 04/23/2019  ? Asthma 09/13/2017  ? COPD with acute exacerbation ([place]) 07/03/2016  ? Hyperlipidemia 07/03/2016  ? Tobacco abuse   ? Sleep disorder 07/02/2016  ? Carotid artery disease ([place]) 05/04/2016  ? Migraine aura without headache 02/08/2014  ? ? ?REFERRING PROVIDER: Alda Berthold, DO ?  ?REFERRING DIAG: Falls frequently ? ?THERAPY DIAG:  ?No diagnosis found. ? ?PERTINENT HISTORY: Frequent falls, history of left breast cancer s/p double mastectomy (2017), depression/anxiety, COPD with asthma, and migraine  ? ?PRECAUTIONS: Fall ? ?SUBJECTIVE: Patient reports she is doing better, but she does note that she has fallen once since last visit due to her cat running under her feet. ? ?PAIN:  ?Are you having pain? No ?NPRS scale: N/A ?Pain location: N/A ?Pain orientation: N/A ?PAIN TYPE: N/A ?Pain description: N/A ?Aggravating factors: N/A ?Relieving factors:  N/A ? ?PATIENT GOALS: Improve balance, fall less ? ? ?OBJECTIVE:  ?PATIENT SURVEYS:  ?FOTO 60% functional status (predicted 60%) ?  ?LE MMT: ?  ?MMT Right ?04/04/2021 Left ?04/04/2021 Rt / Lt ?05/15/2021  ?Hip flexion 4- 4- 4- / 4-  ?Hip extension 3 3 3+ / 3+  ?Hip abduction 3 3 3+ / 3+  ?Knee flexion 4 4 4+ / 4+  ?Knee extension 4+ 4+ 4+ / 4+  ?  ?FUNCTIONAL TESTS:  ?5 times sit to stand: 10 seconds ?Berg Balance Scale: 48 - 05/15/2021 ?TUG: 9 seconds ?  ?GAIT: ?Assistive device utilized: None ?Level of assistance: Complete Independence ?Comments: Narrow stance, occasionally with unsteady gait especially with turns  ?  ?  ?TODAY'S TREATMENT: ? ? 05/30/2021: ?Therapeutic Exercise: ?NuStep L5 x 5 min with LE while taking subjective ?SLR 2 x 10 each ?Bridge 2 x 10 ?Hooklying clamshell 2 x 10 ?Sidelying hip abduction 2 x 10 each ?LAQ 3# 2 x 10 each ?Sit to stand x 10, hold 5# at chest x 10 ?Heel raises 2 x 20 ?Neuro Re-ed: ?1/2 Tandem on Airex 2 x 30  sec each ?Tandem stance 2 x 30 sec each ?Standing march on Airex 2x20 ?Rockerboard fwd/bwd taps and lateral taps 2 x 1 min each ? ?05/23/2021: ?Therapeutic Exercise: ?NuStep L5 x 5 min with LE while taking subjective ?SLR 2 x 10 each ?Bridge 2 x 10 ?Hooklying clamshell 2 x 10 ?Sidelying hip abduction 2 x 10 each ?LAQ 3# 2 x 10 each ?Sit to stand x 10, hold 5# at chest x 10 ?Heel raises 2 x 20 ?Neuro Re-ed: ?1/2 Tandem on Airex 2 x 30 sec each ?Tandem stance 2 x 30 sec each ?Rockerboard fwd/bwd taps and lateral taps 2 x 1 min each ? ? ?05/15/2021: ?Therapeutic Exercise: ?NuStep L4 x 6 min with LE while taking subjective ?LTR x 10 ?SLR 2 x 5 each ?Bridge 2 x 5 ?Hooklying clamshell 2 x 10 ?Sidelying hip abduction 2 x 5 each ?LAQ 3# 2 x 10 each ?Seated march 2 x 20 ?Seated hamstring curl with red 2 x 5 each ?Sit to stand 2 x 10 - hands on thighs ?Neuro Re-ed: ?Tandem stance 3 x 30 sec each ?Rockerboard fwd/bwd taps 2 x 1 min ? ?  ?PATIENT EDUCATION:  ?Education details: HEP ?Person  educated: Patient ?Education method: Explanation, Demonstration, Tactile cues, Verbal cues ?Education comprehension: verbalized understanding, returned demonstration, verbal cues required, tactile cues requ

## 2021-05-31 ENCOUNTER — Other Ambulatory Visit: Payer: Self-pay | Admitting: Family Medicine

## 2021-06-01 ENCOUNTER — Ambulatory Visit (INDEPENDENT_AMBULATORY_CARE_PROVIDER_SITE_OTHER): Payer: Medicare HMO | Admitting: Family Medicine

## 2021-06-01 ENCOUNTER — Other Ambulatory Visit: Payer: Self-pay

## 2021-06-01 VITALS — BP 110/50 | HR 78 | Temp 97.5°F | Resp 18 | Ht 61.0 in | Wt 86.8 lb

## 2021-06-01 DIAGNOSIS — F3160 Bipolar disorder, current episode mixed, unspecified: Secondary | ICD-10-CM

## 2021-06-01 DIAGNOSIS — J410 Simple chronic bronchitis: Secondary | ICD-10-CM | POA: Diagnosis not present

## 2021-06-01 DIAGNOSIS — R413 Other amnesia: Secondary | ICD-10-CM | POA: Diagnosis not present

## 2021-06-01 DIAGNOSIS — G5603 Carpal tunnel syndrome, bilateral upper limbs: Secondary | ICD-10-CM | POA: Diagnosis not present

## 2021-06-01 MED ORDER — PROPRANOLOL HCL 40 MG PO TABS: 40.0000 mg | ORAL_TABLET | Freq: Three times a day (TID) | ORAL | 1 refills | Status: DC

## 2021-06-01 MED ORDER — AZITHROMYCIN 250 MG PO TABS: 250.0000 mg | ORAL_TABLET | Freq: Every day | ORAL | 0 refills | Status: DC

## 2021-06-01 MED ORDER — QUETIAPINE FUMARATE 100 MG PO TABS: 100.0000 mg | ORAL_TABLET | Freq: Every day | ORAL | 0 refills | Status: DC

## 2021-06-01 MED ORDER — NYSTATIN 100000 UNIT/ML MT SUSP: 5.0000 mL | Freq: Four times a day (QID) | OROMUCOSAL | 0 refills | Status: AC

## 2021-06-01 MED ORDER — CEFDINIR 300 MG PO CAPS: 300.0000 mg | ORAL_CAPSULE | Freq: Two times a day (BID) | ORAL | 0 refills | Status: DC

## 2021-06-01 NOTE — Progress Notes (Signed)
? ?Subjective:  ?Patient ID: Anne [name], female    DOB: February 17, 1946  Age: 76 y.o. MRN: [id number] ? ?Chief Complaint  ?Patient presents with  ? Cough  ? Headache  ? ?HPI ?Complaining of congestion, sore throat, earaches, headaches, weight loss, dizzines, exhaustion, fatigue x 5 weeks.  ?Migraines 1-2 times per week.  ? ?Grip of right hand is weak and tremor of right hand numbness  ?Insomnia: 10:30 pm - 4 am.  ? ?Patient becomes angry and irritable easily. Patient's father was bipolar.  ?Mood disorder questionnaire: concerning for bipolar.  ? ?  06/01/2021  ?  3:53 PM 12/25/2020  ?  3:50 PM 10/13/2020  ?  2:38 PM  ?PHQ9 SCORE ONLY  ?PHQ-9 Total Score _0 ? ? ?Current Outpatient Medications on File Prior to Visit  ?Medication Sig Dispense Refill  ? albuterol (VENTOLIN HFA) 108 (90 Base) MCG/ACT inhaler INHALE 1 TO 2 PUFFS BY MOUTH EVERY 6 HOURS AS NEEDED FOR WHEEZING FOR SHORTNESS OF BREATH 18 g 0  ? amitriptyline (ELAVIL) 75 MG tablet TAKE 1 TABLET (75 MG TOTAL) BY MOUTH DAILY. 90 tablet 0  ? Atogepant ([name]) 60 MG TABS Take 60 mg by mouth daily. (Patient not taking: Reported on 04/24/2021) 12 tablet 0  ? Atogepant ([name]) 60 MG TABS Take 60 mg by mouth daily. 90 tablet 3  ? cetirizine (ZYRTEC) 10 MG tablet Take 1 tablet (10 mg total) by mouth daily. 30 tablet 11  ? Fluticasone-Umeclidin-Vilant (TRELEGY ELLIPTA) 200-62.5-25 MCG/ACT AEPB Inhale 1 puff into the lungs daily. 3 each 3  ? furosemide (LASIX) 20 MG tablet Take 1 tablet (20 mg total) by mouth daily as needed for edema (for leg swelling). 90 tablet 1  ? gabapentin (NEURONTIN) 300 MG capsule Take 2 capsules (600 mg total) by mouth 3 (three) times daily. 180 capsule 1  ? Melatonin 1 MG CAPS Take by mouth.    ? meloxicam (MOBIC) 15 MG tablet TAKE 1 TABLET EVERY DAY 90 tablet 1  ? mirtazapine (REMERON) 45 MG tablet TAKE 1 TABLET (45 MG TOTAL) BY MOUTH AT BEDTIME. 90 tablet 1  ? omeprazole (PRILOSEC) 40 MG capsule TAKE 1 CAPSULE TWICE DAILY 180 capsule  1  ? orphenadrine (NORFLEX) 100 MG tablet TAKE 1 TABLET (100 MG TOTAL) BY MOUTH 2 (TWO) TIMES DAILY AS NEEDED FOR MUSCLE SPASMS. 180 tablet 0  ? Plecanatide (TRULANCE) 3 MG TABS Take 1 tablet by mouth daily. (Patient not taking: Reported on 04/24/2021) 12 tablet 0  ? Plecanatide (TRULANCE) 3 MG TABS Take 1 tablet by mouth daily. 90 tablet 3  ? primidone (MYSOLINE) 50 MG tablet TAKE 1 TABLET BY MOUTH IN THE MORNING AND TAKE 1 TABLET AT BEDTIME 180 tablet 0  ? traZODone (DESYREL) 100 MG tablet TAKE 1 TABLET (100 MG TOTAL) BY MOUTH AT BEDTIME AS NEEDED FOR SLEEP. 90 tablet 1  ? ?No current facility-administered medications on file prior to visit.  ? ?Past Medical History:  ?Diagnosis Date  ? Abnormal MRI of the head 02/08/2014  ? Formatting of this note might be different from the original. History of abn MRI brain in 2015 due to concussion. Formatting of this note might be different from the original. Overview:  History of abn MRI brain in 2015 due to concussion.  ? Acquired absence of both breasts and nipples 06/08/2015  ? Asthma   ? daily inhalers  ? Breast cancer ([place]) 05/2015  ? left  ? Breast cancer of upper-inner quadrant of  left female breast ([place]) 04/15/2015  ? COPD (chronic obstructive pulmonary disease) ([place])   ? denies SOB with ADLs; no O2  ? Dental crowns present   ? Depression   ? Family history of breast cancer in female 04/26/2015  ? Dx. Niece at 19; dx. Maternal aunt in her late 32s  Formatting of this note might be different from the original. Overview:  Dx. Niece at 61; dx. Maternal aunt in her late 74s  ? Family history of colon cancer 04/26/2015  ? Dx. In maternal grandfather in his late 89s-70  Formatting of this note might be different from the original. Overview:  Dx. In maternal grandfather in his late 90s-70  ? Genetic testing 06/02/2015  ? Negative for pathogenic mutations within any of 20 genes on the Breast/Ovarian Cancer Panel through [employer].  No variants of uncertain significance  (VUSes) were found.  The Breast/Ovarian Cancer Panel offered by [facility] Hope Pigeon, MD) includes sequencing and deletion/duplication analysis for the following 19 genes:  ATM, BARD1, BRCA1, BRCA2, BRIP1, CDH1, CHEK2, FANCC, M  ? History of basal cell carcinoma 04/26/2015  ? Dx. 34  Formatting of this note might be different from the original. Overview:  Dx. 68  ? History of concussion 01/01/2014  ? History of kidney stones   ? History of TIA (transient ischemic attack) 10/2014  ? Hyperlipidemia   ? Insomnia   ? Malrotation of intestine 09/23/2017  ? Migraine   ? Nasal congestion 05/26/2015  ? will finish z-pak 05/27/2015  ? Nonproductive cough 05/26/2015  ? PONV (postoperative nausea and vomiting)   ? "long time ago"  none recently  ? Psychogenic syncope 04/23/2019  ? RLS (restless legs syndrome)   ? Tobacco abuse   ? Vitamin D deficiency   ? Wears dentures   ? lower  ? Wears partial dentures   ? upper  ? Weight loss 08/28/2019  ? ?Past Surgical History:  ?Procedure Laterality Date  ? ABDOMINAL HYSTERECTOMY    ? APPENDECTOMY  2000  ? BREAST BIOPSY Left   ? BREAST RECONSTRUCTION WITH PLACEMENT OF TISSUE EXPANDER AND FLEX HD (ACELLULAR HYDRATED DERMIS) Bilateral 06/02/2015  ? Procedure: BILATERAL BREAST RECONSTRUCTION WITH PLACEMENT OF TISSUE EXPANDER AND  ACELLULAR HYDRATED DERMIS;  Surgeon: Irene Limbo, MD;  Location: [facility];  Service: Plastics;  Laterality: Bilateral;  ? CARPAL TUNNEL RELEASE Bilateral   ? CATARACT EXTRACTION Bilateral 09/2013  ? KNEE ARTHROSCOPY Left   ? LAPAROSCOPIC ABDOMINAL EXPLORATION N/A 10/2017  ? LAPAROSCOPIC LYSIS OF CONGENITAL ADHESIVE BAND/LADD'S BANDS WIH INTRAOPERATIVE CHOLANGIOGRAPHY. Perforation of small intestion requiring repair.   ? LOOP RECORDER INSERTION N/A 05/11/2016  ? Procedure: Loop Recorder Insertion;  Surgeon: Will Meredith Leeds, MD;  Location: [facility];  Service: Cardiovascular;  Laterality: N/A;  ? MASTECTOMY W/ SENTINEL NODE BIOPSY  Bilateral 06/02/2015  ? Procedure: BILATERAL MASTECTOMY WITH LEFT SENTINEL LYMPH NODE BIOPSY;  Surgeon: Stark Klein, MD;  Location: [facility];  Service: General;  Laterality: Bilateral;  ? NASAL SEPTUM SURGERY    ? x 3  ? PLANTAR FASCIA SURGERY Left   ? REMOVAL OF BILATERAL TISSUE EXPANDERS WITH PLACEMENT OF BILATERAL BREAST IMPLANTS Bilateral 06/17/2015  ? Procedure: DEBRIDEMENT OF BILATERAL MASTECTOMY FLAP WITH BILATERAL TISSUE EXPANDER EXCHANGE ;  Surgeon: Irene Limbo, MD;  Location: [facility];  Service: Plastics;  Laterality: Bilateral;  ? REMOVAL OF TISSUE EXPANDER Bilateral 07/12/2015  ? Procedure: REMOVAL OF BILATERAL TISSUE EXPANDERS;  Surgeon: Irene Limbo, MD;  Location: [facility];  Service: Plastics;  Laterality: Bilateral;  ?  ?Family History  ?Problem Relation Age of Onset  ? Cervical cancer Mother 47  ? Colon cancer Maternal Grandfather   ?     mets to stomach; dx. 67-70  ? Heart disease Father   ? Prostate cancer Father 60  ? Cervical cancer Maternal Grandmother   ?     dx. 51s; treated with radium implant  ? Lung cancer Sister 58  ?     maternal half-sister dx. lung cancer, stage III  ? Breast cancer Maternal Aunt   ? Cervical cancer Sister 19  ?     paternal half-sister; s/p TAH  ? Spina bifida Grandchild   ? Brain cancer Grandchild   ?     grandson dx. at 20 mos, [facility]  ? Breast cancer Other 41  ?     maternal half-sister's daughter  ? Cancer Maternal Aunt   ?     d. 83; unspecified type - "began at back area and moved to vital organs"  ? Cancer Maternal Uncle   ?      late 98s; unspecified type; "started at back and moved to vital organs"  ? ?Social History  ? ?Socioeconomic History  ? Marital status: Widowed  ?  Spouse name: Not on file  ? Number of children: 1  ? Years of education: 27  ? Highest education level: Not on file  ?Occupational History  ?  Comment: [employer], retired  ?Tobacco Use  ? Smoking status: Former  ?  Packs/day: 0.25  ?   Types: Cigarettes  ?  Quit date: 03/21/2020  ?  Years since quitting: 1.2  ? Smokeless tobacco: Never  ?Vaping Use  ? Vaping Use: Never used  ?Substance and Sexual Activity  ? Alcohol use: Yes  ?  Alcoho

## 2021-06-01 NOTE — Patient Instructions (Addendum)
Increase propranolol to 40 mg one twice a day until new prescription arrives and then go to 40 mg three times a day.  ? ?Continue abilify until seroquel arrives. Then change seroquel 100 mg before bed.  ? ?Recommend carpal tunnel brace.  ? ?

## 2021-06-05 ENCOUNTER — Other Ambulatory Visit: Payer: Self-pay | Admitting: Family Medicine

## 2021-06-05 NOTE — Therapy (Signed)
?OUTPATIENT PHYSICAL THERAPY TREATMENT NOTE ? ? ?Patient Name: Anne [name] ?MRN: [id number] ?DOB:24-Oct-1945, 76 y.o., female ?Today's Date: 06/06/2021 ? ?PCP: [name], MD ?REFERRING PROVIDER: [name], MD ? ? PT End of Session - 06/06/21 1535   ? ? Visit Number 7   ? Number of Visits 13   ? Date for PT Re-Evaluation 07/10/21   ? Authorization Type [employer] MCR   ? Authorization Time Period 05/30/2021 - 07/28/2021   ? Authorization - Visit Number 1   ? Authorization - Number of Visits 8   ? PT Start Time 1530   ? PT Stop Time 1610   ? PT Time Calculation (min) 40 min   ? Activity Tolerance Patient tolerated treatment well   ? Behavior During Therapy [facility] for tasks assessed/performed   ? ?  ?  ? ?  ? ? ? ? ? ? ? ?Past Medical History:  ?Diagnosis Date  ? Abnormal MRI of the head 02/08/2014  ? Formatting of this note might be different from the original. History of abn MRI brain in 2015 due to concussion. Formatting of this note might be different from the original. Overview:  History of abn MRI brain in 2015 due to concussion.  ? Acquired absence of both breasts and nipples 06/08/2015  ? Asthma   ? daily inhalers  ? Breast cancer ([place]) 05/2015  ? left  ? Breast cancer of upper-inner quadrant of left female breast ([place]) 04/15/2015  ? COPD (chronic obstructive pulmonary disease) ([place])   ? denies SOB with ADLs; no O2  ? Dental crowns present   ? Depression   ? Family history of breast cancer in female 04/26/2015  ? Dx. Niece at 44; dx. Maternal aunt in her late 77s  Formatting of this note might be different from the original. Overview:  Dx. Niece at 68; dx. Maternal aunt in her late 65s  ? Family history of colon cancer 04/26/2015  ? Dx. In maternal grandfather in his late 93s-70  Formatting of this note might be different from the original. Overview:  Dx. In maternal grandfather in his late 37s-70  ? Genetic testing 06/02/2015  ? Negative for pathogenic mutations within any of 20 genes on the Breast/Ovarian Cancer Panel  through [employer].  No variants of uncertain significance (VUSes) were found.  The Breast/Ovarian Cancer Panel offered by [facility] Hope Pigeon, MD) includes sequencing and deletion/duplication analysis for the following 19 genes:  ATM, BARD1, BRCA1, BRCA2, BRIP1, CDH1, CHEK2, FANCC, M  ? History of basal cell carcinoma 04/26/2015  ? Dx. 54  Formatting of this note might be different from the original. Overview:  Dx. 68  ? History of concussion 01/01/2014  ? History of kidney stones   ? History of TIA (transient ischemic attack) 10/2014  ? Hyperlipidemia   ? Insomnia   ? Malrotation of intestine 09/23/2017  ? Migraine   ? Nasal congestion 05/26/2015  ? will finish z-pak 05/27/2015  ? Nonproductive cough 05/26/2015  ? PONV (postoperative nausea and vomiting)   ? "long time ago"  none recently  ? Psychogenic syncope 04/23/2019  ? RLS (restless legs syndrome)   ? Tobacco abuse   ? Vitamin D deficiency   ? Wears dentures   ? lower  ? Wears partial dentures   ? upper  ? Weight loss 08/28/2019  ? ?Past Surgical History:  ?Procedure Laterality Date  ? ABDOMINAL HYSTERECTOMY    ? APPENDECTOMY  2000  ? BREAST BIOPSY Left   ?  BREAST RECONSTRUCTION WITH PLACEMENT OF TISSUE EXPANDER AND FLEX HD (ACELLULAR HYDRATED DERMIS) Bilateral 06/02/2015  ? Procedure: BILATERAL BREAST RECONSTRUCTION WITH PLACEMENT OF TISSUE EXPANDER AND  ACELLULAR HYDRATED DERMIS;  Surgeon: Irene Limbo, MD;  Location: [place];  Service: Plastics;  Laterality: Bilateral;  ? CARPAL TUNNEL RELEASE Bilateral   ? CATARACT EXTRACTION Bilateral 09/2013  ? KNEE ARTHROSCOPY Left   ? LAPAROSCOPIC ABDOMINAL EXPLORATION N/A 10/2017  ? LAPAROSCOPIC LYSIS OF CONGENITAL ADHESIVE BAND/LADD'S BANDS WIH INTRAOPERATIVE CHOLANGIOGRAPHY. Perforation of small intestion requiring repair.   ? LOOP RECORDER INSERTION N/A 05/11/2016  ? Procedure: Loop Recorder Insertion;  Surgeon: Will Meredith Leeds, MD;  Location: [place] [facility];  Service:  Cardiovascular;  Laterality: N/A;  ? MASTECTOMY W/ SENTINEL NODE BIOPSY Bilateral 06/02/2015  ? Procedure: BILATERAL MASTECTOMY WITH LEFT SENTINEL LYMPH NODE BIOPSY;  Surgeon: Stark Klein, MD;  Location: [place];  Service: General;  Laterality: Bilateral;  ? NASAL SEPTUM SURGERY    ? x 3  ? PLANTAR FASCIA SURGERY Left   ? REMOVAL OF BILATERAL TISSUE EXPANDERS WITH PLACEMENT OF BILATERAL BREAST IMPLANTS Bilateral 06/17/2015  ? Procedure: DEBRIDEMENT OF BILATERAL MASTECTOMY FLAP WITH BILATERAL TISSUE EXPANDER EXCHANGE ;  Surgeon: Irene Limbo, MD;  Location: [place];  Service: Plastics;  Laterality: Bilateral;  ? REMOVAL OF TISSUE EXPANDER Bilateral 07/12/2015  ? Procedure: REMOVAL OF BILATERAL TISSUE EXPANDERS;  Surgeon: Irene Limbo, MD;  Location: [place];  Service: Plastics;  Laterality: Bilateral;  ? ?Patient Active Problem List  ? Diagnosis Date Noted  ? Acute infection of nasal sinus 05/04/2021  ? Drug induced constipation 04/10/2021  ? EIC (epidermal inclusion cyst) 03/21/2021  ? Acute migraine 01/30/2021  ? Aortic atherosclerosis ([place]) 01/30/2021  ? Bilateral carpal tunnel syndrome 01/30/2021  ? Chronic pain in right shoulder 12/25/2020  ? Chronic migraine without aura, intractable, without status migrainosus 12/25/2020  ? Memory loss 10/18/2020  ? Ataxia 10/18/2020  ? Frequent falls 10/18/2020  ? Tremor 10/18/2020  ? Moderate recurrent major depression ([place]) 10/18/2020  ? Lumbar back pain 10/18/2020  ? Acute cough 12/08/2019  ? Simple chronic bronchitis ([place]) 07/04/2019  ? Right lumbar radiculopathy 04/23/2019  ? Asthma 09/13/2017  ? COPD with acute exacerbation ([place]) 07/03/2016  ? Hyperlipidemia 07/03/2016  ? Tobacco abuse   ? Sleep disorder 07/02/2016  ? Carotid artery disease ([place]) 05/04/2016  ? Migraine aura without headache 02/08/2014  ? ? ?REFERRING PROVIDER: Alda Berthold, DO ?  ?REFERRING DIAG: Falls frequently ? ?THERAPY DIAG:  ?Repeated falls ? ?Muscle  weakness (generalized) ? ?PERTINENT HISTORY: Frequent falls, history of left breast cancer s/p double mastectomy (2017), depression/anxiety, COPD with asthma, and migraine  ? ?PRECAUTIONS: Fall ? ?SUBJECTIVE: Patient reports she is doing well, she is feeling generalized soreness today from being active over the weekend. She also notes having a fall this morning going down back steps in the dark, denies any injuries but this is likely contributing to her soreness.  ? ?PAIN:  ?Are you having pain? No ?NPRS scale: N/A ?Pain location: N/A ?Pain orientation: N/A ?PAIN TYPE: N/A ?Pain description: N/A ?Aggravating factors: N/A ?Relieving factors: N/A ? ?PATIENT GOALS: Improve balance, fall less ? ? ?OBJECTIVE:  ?PATIENT SURVEYS:  ?FOTO 60% functional status (predicted 60%) ?  ?LE MMT: ?  ?MMT Right ?04/04/2021 Left ?04/04/2021 Rt / Lt ?05/15/2021  ?Hip flexion 4- 4- 4- / 4-  ?Hip extension 3 3 3+ / 3+  ?Hip abduction 3 3 3+ /  3+  ?Knee flexion 4 4 4+ / 4+  ?Knee extension 4+ 4+ 4+ / 4+  ?  ?FUNCTIONAL TESTS:  ?5 times sit to stand: 10 seconds ?Berg Balance Scale: 48 - 05/15/2021 ?TUG: 9 seconds ?  ?GAIT: ?Assistive device utilized: None ?Level of assistance: Complete Independence ?Comments: Narrow stance, occasionally with unsteady gait especially with turns  ?  ?  ?TODAY'S TREATMENT: ?06/06/2021: ?Therapeutic Exercise: ?NuStep L5 x 5 min with LE while taking subjective ?LTR x 10 ?Hooklying clamshell with red 2 x 10 ?SLR 2 x 10 each ?Bridge 2 x 10 ?Sidelying hip abduction 2 x 10 each ?LAQ 3# 2 x 10 each ?Standing hamstring curls with 2# 2 x 10 ?Heel raises with 2# 2 x 10 ?Sit to stand holding 5# at chest 2 x 10 ?Neuro Re-ed: ?Tandem stance 2 x 30 sec each ?Rockerboard fwd/bwd taps and lateral taps 2 x 1 min each ? ? ?05/23/2021: ?Therapeutic Exercise: ?NuStep L5 x 5 min with LE while taking subjective ?SLR 2 x 10 each ?Bridge 2 x 10 ?Hooklying clamshell 2 x 10 ?Sidelying hip abduction 2 x 10 each ?LAQ 3# 2 x 10 each ?Sit to  stand x 10, hold 5# at chest x 10 ?Heel raises 2 x 20 ?Neuro Re-ed: ?1/2 Tandem on Airex 2 x 30 sec each ?Tandem stance 2 x 30 sec each ?Rockerboard fwd/bwd taps and lateral taps 2 x 1 min each ? ?05/15/2021: ?Ther

## 2021-06-06 ENCOUNTER — Ambulatory Visit: Payer: Medicare HMO | Attending: Neurology | Admitting: Physical Therapy

## 2021-06-06 ENCOUNTER — Encounter: Payer: Self-pay | Admitting: Physical Therapy

## 2021-06-06 ENCOUNTER — Other Ambulatory Visit: Payer: Self-pay

## 2021-06-06 DIAGNOSIS — R296 Repeated falls: Secondary | ICD-10-CM | POA: Diagnosis not present

## 2021-06-06 DIAGNOSIS — M6281 Muscle weakness (generalized): Secondary | ICD-10-CM

## 2021-06-07 ENCOUNTER — Other Ambulatory Visit: Payer: Self-pay

## 2021-06-07 ENCOUNTER — Telehealth: Payer: Self-pay

## 2021-06-07 DIAGNOSIS — M5416 Radiculopathy, lumbar region: Secondary | ICD-10-CM

## 2021-06-07 MED ORDER — HYDROCODONE-ACETAMINOPHEN 5-325 MG PO TABS: 1.0000 | ORAL_TABLET | Freq: Every day | ORAL | 0 refills | Status: DC | PRN

## 2021-06-07 NOTE — Telephone Encounter (Signed)
Patient called stating her symptoms are worse (forgetfulness/jumbled speech) and would like to proceed with more testing for memory issues and parkinson's. Stated this was discussed "lightly" in her last visit. ?

## 2021-06-12 NOTE — Therapy (Signed)
?OUTPATIENT PHYSICAL THERAPY TREATMENT NOTE ? ? ?Patient Name: Anne [name] ?MRN: [id number] ?DOB:Mar 27, 1945, 76 y.o., female ?Today's Date: 06/13/2021 ? ?PCP: [name], MD ?REFERRING PROVIDER: [name], MD ? ? PT End of Session - 06/13/21 1537   ? ? Visit Number 8   ? Number of Visits 13   ? Date for PT Re-Evaluation 07/10/21   ? Authorization Type [employer] MCR   ? Authorization Time Period 05/30/2021 - 07/28/2021   ? Authorization - Visit Number 2   ? Authorization - Number of Visits 8   ? PT Start Time 1530   ? PT Stop Time 1610   ? PT Time Calculation (min) 40 min   ? Activity Tolerance Patient tolerated treatment well   ? Behavior During Therapy [facility] for tasks assessed/performed   ? ?  ?  ? ?  ? ? ? ? ? ? ? ? ?Past Medical History:  ?Diagnosis Date  ? Abnormal MRI of the head 02/08/2014  ? Formatting of this note might be different from the original. History of abn MRI brain in 2015 due to concussion. Formatting of this note might be different from the original. Overview:  History of abn MRI brain in 2015 due to concussion.  ? Acquired absence of both breasts and nipples 06/08/2015  ? Asthma   ? daily inhalers  ? Breast cancer ([place]) 05/2015  ? left  ? Breast cancer of upper-inner quadrant of left female breast ([place]) 04/15/2015  ? COPD (chronic obstructive pulmonary disease) ([place])   ? denies SOB with ADLs; no O2  ? Dental crowns present   ? Depression   ? Family history of breast cancer in female 04/26/2015  ? Dx. Niece at 31; dx. Maternal aunt in her late 6s  Formatting of this note might be different from the original. Overview:  Dx. Niece at 83; dx. Maternal aunt in her late 29s  ? Family history of colon cancer 04/26/2015  ? Dx. In maternal grandfather in his late 22s-70  Formatting of this note might be different from the original. Overview:  Dx. In maternal grandfather in his late 9s-70  ? Genetic testing 06/02/2015  ? Negative for pathogenic mutations within any of 20 genes on the Breast/Ovarian Cancer  Panel through [employer].  No variants of uncertain significance (VUSes) were found.  The Breast/Ovarian Cancer Panel offered by [facility] Hope Pigeon, MD) includes sequencing and deletion/duplication analysis for the following 19 genes:  ATM, BARD1, BRCA1, BRCA2, BRIP1, CDH1, CHEK2, FANCC, M  ? History of basal cell carcinoma 04/26/2015  ? Dx. 3  Formatting of this note might be different from the original. Overview:  Dx. 68  ? History of concussion 01/01/2014  ? History of kidney stones   ? History of TIA (transient ischemic attack) 10/2014  ? Hyperlipidemia   ? Insomnia   ? Malrotation of intestine 09/23/2017  ? Migraine   ? Nasal congestion 05/26/2015  ? will finish z-pak 05/27/2015  ? Nonproductive cough 05/26/2015  ? PONV (postoperative nausea and vomiting)   ? "long time ago"  none recently  ? Psychogenic syncope 04/23/2019  ? RLS (restless legs syndrome)   ? Tobacco abuse   ? Vitamin D deficiency   ? Wears dentures   ? lower  ? Wears partial dentures   ? upper  ? Weight loss 08/28/2019  ? ?Past Surgical History:  ?Procedure Laterality Date  ? ABDOMINAL HYSTERECTOMY    ? APPENDECTOMY  2000  ? BREAST BIOPSY Left   ?  BREAST RECONSTRUCTION WITH PLACEMENT OF TISSUE EXPANDER AND FLEX HD (ACELLULAR HYDRATED DERMIS) Bilateral 06/02/2015  ? Procedure: BILATERAL BREAST RECONSTRUCTION WITH PLACEMENT OF TISSUE EXPANDER AND  ACELLULAR HYDRATED DERMIS;  Surgeon: Irene Limbo, MD;  Location: [place];  Service: Plastics;  Laterality: Bilateral;  ? CARPAL TUNNEL RELEASE Bilateral   ? CATARACT EXTRACTION Bilateral 09/2013  ? KNEE ARTHROSCOPY Left   ? LAPAROSCOPIC ABDOMINAL EXPLORATION N/A 10/2017  ? LAPAROSCOPIC LYSIS OF CONGENITAL ADHESIVE BAND/LADD'S BANDS WIH INTRAOPERATIVE CHOLANGIOGRAPHY. Perforation of small intestion requiring repair.   ? LOOP RECORDER INSERTION N/A 05/11/2016  ? Procedure: Loop Recorder Insertion;  Surgeon: Will Meredith Leeds, MD;  Location: [place] [facility];   Service: Cardiovascular;  Laterality: N/A;  ? MASTECTOMY W/ SENTINEL NODE BIOPSY Bilateral 06/02/2015  ? Procedure: BILATERAL MASTECTOMY WITH LEFT SENTINEL LYMPH NODE BIOPSY;  Surgeon: Stark Klein, MD;  Location: [place];  Service: General;  Laterality: Bilateral;  ? NASAL SEPTUM SURGERY    ? x 3  ? PLANTAR FASCIA SURGERY Left   ? REMOVAL OF BILATERAL TISSUE EXPANDERS WITH PLACEMENT OF BILATERAL BREAST IMPLANTS Bilateral 06/17/2015  ? Procedure: DEBRIDEMENT OF BILATERAL MASTECTOMY FLAP WITH BILATERAL TISSUE EXPANDER EXCHANGE ;  Surgeon: Irene Limbo, MD;  Location: [place];  Service: Plastics;  Laterality: Bilateral;  ? REMOVAL OF TISSUE EXPANDER Bilateral 07/12/2015  ? Procedure: REMOVAL OF BILATERAL TISSUE EXPANDERS;  Surgeon: Irene Limbo, MD;  Location: [place];  Service: Plastics;  Laterality: Bilateral;  ? ?Patient Active Problem List  ? Diagnosis Date Noted  ? Acute infection of nasal sinus 05/04/2021  ? Drug induced constipation 04/10/2021  ? EIC (epidermal inclusion cyst) 03/21/2021  ? Acute migraine 01/30/2021  ? Aortic atherosclerosis ([place]) 01/30/2021  ? Bilateral carpal tunnel syndrome 01/30/2021  ? Chronic pain in right shoulder 12/25/2020  ? Chronic migraine without aura, intractable, without status migrainosus 12/25/2020  ? Memory loss 10/18/2020  ? Ataxia 10/18/2020  ? Frequent falls 10/18/2020  ? Tremor 10/18/2020  ? Moderate recurrent major depression ([place]) 10/18/2020  ? Lumbar back pain 10/18/2020  ? Acute cough 12/08/2019  ? Simple chronic bronchitis ([place]) 07/04/2019  ? Right lumbar radiculopathy 04/23/2019  ? Asthma 09/13/2017  ? COPD with acute exacerbation ([place]) 07/03/2016  ? Hyperlipidemia 07/03/2016  ? Tobacco abuse   ? Sleep disorder 07/02/2016  ? Carotid artery disease ([place]) 05/04/2016  ? Migraine aura without headache 02/08/2014  ? ? ?REFERRING PROVIDER: Alda Berthold, DO ?  ?REFERRING DIAG: Falls frequently ? ?THERAPY DIAG:  ?Repeated  falls ? ?Muscle weakness (generalized) ? ?PERTINENT HISTORY: Frequent falls, history of left breast cancer s/p double mastectomy (2017), depression/anxiety, COPD with asthma, and migraine  ? ?PRECAUTIONS: Fall ? ?SUBJECTIVE: Patient reports she had 1 fall since last visit. States she was just standing and lost her balance.  ? ?PAIN:  ?Are you having pain? No ?NPRS scale: N/A ?Pain location: N/A ?Pain orientation: N/A ?PAIN TYPE: N/A ?Pain description: N/A ?Aggravating factors: N/A ?Relieving factors: N/A ? ?PATIENT GOALS: Improve balance, fall less ? ? ?OBJECTIVE:  ?PATIENT SURVEYS:  ?FOTO 60% functional status (predicted 60%) ?  ?LE MMT: ?  ?MMT Right ?04/04/2021 Left ?04/04/2021 Rt / Lt ?05/15/2021  ?Hip flexion 4- 4- 4- / 4-  ?Hip extension 3 3 3+ / 3+  ?Hip abduction 3 3 3+ / 3+  ?Knee flexion 4 4 4+ / 4+  ?Knee extension 4+ 4+ 4+ / 4+  ?  ?FUNCTIONAL TESTS:  ?5 times sit  to stand: 10 seconds ?Berg Balance Scale: 48 - 05/15/2021 ?TUG: 9 seconds ?  ?GAIT: ?Assistive device utilized: None ?Level of assistance: Complete Independence ?Comments: Narrow stance, occasionally with unsteady gait especially with turns  ?  ?  ?TODAY'S TREATMENT: ?06/13/2021: ?Therapeutic Exercise: ?NuStep L5 x 5 min with LE only while taking subjective ?SLR 2 x 10 each ?Bridge 2 x 10 ?Sidelying hip abduction 2 x 10 each ?LAQ 3# 2 x 15 each ?Standing hamstring curls with 3# 2 x 10 ?Heel raises with 3# 2 x 15 ?Sit to stand holding 6# at chest 3 x 10 ?Forward step-up 6" box 2 x 10 each ?Neuro Re-ed: ?Tandem stance 2 x 30 sec each ?Rockerboard fwd/bwd taps and lateral taps 2 x 1 min each ? ? ?06/06/2021: ?Therapeutic Exercise: ?NuStep L5 x 5 min with LE while taking subjective ?LTR x 10 ?Hooklying clamshell with red 2 x 10 ?SLR 2 x 10 each ?Bridge 2 x 10 ?Sidelying hip abduction 2 x 10 each ?LAQ 3# 2 x 10 each ?Standing hamstring curls with 2# 2 x 10 ?Heel raises with 2# 2 x 10 ?Sit to stand holding 5# at chest 2 x 10 ?Neuro Re-ed: ?Tandem stance 2 x  30 sec each ?Rockerboard fwd/bwd taps and lateral taps 2 x 1 min each ? ?05/23/2021: ?Therapeutic Exercise: ?NuStep L5 x 5 min with LE while taking subjective ?SLR 2 x 10 each ?Bridge 2 x 10 ?Hooklying clamshell 2 x 10 ?Si

## 2021-06-13 ENCOUNTER — Encounter: Payer: Self-pay | Admitting: Physical Therapy

## 2021-06-13 ENCOUNTER — Other Ambulatory Visit: Payer: Self-pay

## 2021-06-13 ENCOUNTER — Ambulatory Visit: Payer: Medicare HMO | Admitting: Physical Therapy

## 2021-06-13 DIAGNOSIS — M6281 Muscle weakness (generalized): Secondary | ICD-10-CM

## 2021-06-13 DIAGNOSIS — R296 Repeated falls: Secondary | ICD-10-CM | POA: Diagnosis not present

## 2021-06-16 ENCOUNTER — Encounter: Payer: Self-pay | Admitting: Family Medicine

## 2021-06-16 DIAGNOSIS — F3160 Bipolar disorder, current episode mixed, unspecified: Secondary | ICD-10-CM | POA: Insufficient documentation

## 2021-06-16 HISTORY — DX: Bipolar disorder, current episode mixed, unspecified: F31.60

## 2021-06-16 NOTE — Assessment & Plan Note (Signed)
The current medical regimen is effective;  continue present plan and medications. ?Lungs sound good today. I believe respiratory infection she has had in the last 2-3 weeks is resolving. No further treatment at this time.  ?

## 2021-06-16 NOTE — Assessment & Plan Note (Signed)
Continue abilify until seroquel arrives. Then change seroquel 100 mg before bed.  ? ?

## 2021-06-16 NOTE — Assessment & Plan Note (Signed)
Recommend follow up with neurology. Patient's work up has been negative, but patient continues to have memory issues.  ?

## 2021-06-16 NOTE — Assessment & Plan Note (Signed)
Recommend carpal tunnel brace.  ?

## 2021-06-19 NOTE — Therapy (Signed)
?OUTPATIENT PHYSICAL THERAPY TREATMENT NOTE ? ? ?Patient Name: Anne [name] ?MRN: [id number] ?DOB:January 07, 1946, 76 y.o., female ?Today's Date: 06/20/2021 ? ?PCP: [name], MD ?REFERRING PROVIDER: [name], MD ? ? PT End of Session - 06/20/21 1523   ? ? Visit Number 9   ? Number of Visits 13   ? Date for PT Re-Evaluation 07/10/21   ? Authorization Type [employer] MCR   ? Authorization Time Period 05/30/2021 - 07/28/2021   ? Authorization - Visit Number 3   ? Authorization - Number of Visits 8   ? PT Start Time 1520   ? PT Stop Time 1600   ? PT Time Calculation (min) 40 min   ? Activity Tolerance Patient tolerated treatment well   ? Behavior During Therapy [facility] for tasks assessed/performed   ? ?  ?  ? ?  ? ? ? ? ? ? ? ? ? ?Past Medical History:  ?Diagnosis Date  ? Abnormal MRI of the head 02/08/2014  ? Formatting of this note might be different from the original. History of abn MRI brain in 2015 due to concussion. Formatting of this note might be different from the original. Overview:  History of abn MRI brain in 2015 due to concussion.  ? Acquired absence of both breasts and nipples 06/08/2015  ? Asthma   ? daily inhalers  ? Breast cancer ([place]) 05/2015  ? left  ? Breast cancer of upper-inner quadrant of left female breast ([place]) 04/15/2015  ? COPD (chronic obstructive pulmonary disease) ([place])   ? denies SOB with ADLs; no O2  ? Dental crowns present   ? Depression   ? Family history of breast cancer in female 04/26/2015  ? Dx. Niece at 25; dx. Maternal aunt in her late 46s  Formatting of this note might be different from the original. Overview:  Dx. Niece at 53; dx. Maternal aunt in her late 77s  ? Family history of colon cancer 04/26/2015  ? Dx. In maternal grandfather in his late 42s-70  Formatting of this note might be different from the original. Overview:  Dx. In maternal grandfather in his late 24s-70  ? Genetic testing 06/02/2015  ? Negative for pathogenic mutations within any of 20 genes on the Breast/Ovarian Cancer  Panel through [employer].  No variants of uncertain significance (VUSes) were found.  The Breast/Ovarian Cancer Panel offered by [facility] Hope Pigeon, MD) includes sequencing and deletion/duplication analysis for the following 19 genes:  ATM, BARD1, BRCA1, BRCA2, BRIP1, CDH1, CHEK2, FANCC, M  ? History of basal cell carcinoma 04/26/2015  ? Dx. 84  Formatting of this note might be different from the original. Overview:  Dx. 68  ? History of concussion 01/01/2014  ? History of kidney stones   ? History of TIA (transient ischemic attack) 10/2014  ? Hyperlipidemia   ? Insomnia   ? Malrotation of intestine 09/23/2017  ? Migraine   ? Nasal congestion 05/26/2015  ? will finish z-pak 05/27/2015  ? Nonproductive cough 05/26/2015  ? PONV (postoperative nausea and vomiting)   ? "long time ago"  none recently  ? Psychogenic syncope 04/23/2019  ? RLS (restless legs syndrome)   ? Tobacco abuse   ? Vitamin D deficiency   ? Wears dentures   ? lower  ? Wears partial dentures   ? upper  ? Weight loss 08/28/2019  ? ?Past Surgical History:  ?Procedure Laterality Date  ? ABDOMINAL HYSTERECTOMY    ? APPENDECTOMY  2000  ? BREAST BIOPSY  Left   ? BREAST RECONSTRUCTION WITH PLACEMENT OF TISSUE EXPANDER AND FLEX HD (ACELLULAR HYDRATED DERMIS) Bilateral 06/02/2015  ? Procedure: BILATERAL BREAST RECONSTRUCTION WITH PLACEMENT OF TISSUE EXPANDER AND  ACELLULAR HYDRATED DERMIS;  Surgeon: Irene Limbo, MD;  Location: [place];  Service: Plastics;  Laterality: Bilateral;  ? CARPAL TUNNEL RELEASE Bilateral   ? CATARACT EXTRACTION Bilateral 09/2013  ? KNEE ARTHROSCOPY Left   ? LAPAROSCOPIC ABDOMINAL EXPLORATION N/A 10/2017  ? LAPAROSCOPIC LYSIS OF CONGENITAL ADHESIVE BAND/LADD'S BANDS WIH INTRAOPERATIVE CHOLANGIOGRAPHY. Perforation of small intestion requiring repair.   ? LOOP RECORDER INSERTION N/A 05/11/2016  ? Procedure: Loop Recorder Insertion;  Surgeon: Will Meredith Leeds, MD;  Location: [place] [facility];   Service: Cardiovascular;  Laterality: N/A;  ? MASTECTOMY W/ SENTINEL NODE BIOPSY Bilateral 06/02/2015  ? Procedure: BILATERAL MASTECTOMY WITH LEFT SENTINEL LYMPH NODE BIOPSY;  Surgeon: Stark Klein, MD;  Location: [place];  Service: General;  Laterality: Bilateral;  ? NASAL SEPTUM SURGERY    ? x 3  ? PLANTAR FASCIA SURGERY Left   ? REMOVAL OF BILATERAL TISSUE EXPANDERS WITH PLACEMENT OF BILATERAL BREAST IMPLANTS Bilateral 06/17/2015  ? Procedure: DEBRIDEMENT OF BILATERAL MASTECTOMY FLAP WITH BILATERAL TISSUE EXPANDER EXCHANGE ;  Surgeon: Irene Limbo, MD;  Location: [place];  Service: Plastics;  Laterality: Bilateral;  ? REMOVAL OF TISSUE EXPANDER Bilateral 07/12/2015  ? Procedure: REMOVAL OF BILATERAL TISSUE EXPANDERS;  Surgeon: Irene Limbo, MD;  Location: [place];  Service: Plastics;  Laterality: Bilateral;  ? ?Patient Active Problem List  ? Diagnosis Date Noted  ? Bipolar disorder, mixed ([place]) 06/16/2021  ? Acute infection of nasal sinus 05/04/2021  ? Drug induced constipation 04/10/2021  ? EIC (epidermal inclusion cyst) 03/21/2021  ? Acute migraine 01/30/2021  ? Aortic atherosclerosis ([place]) 01/30/2021  ? Bilateral carpal tunnel syndrome 01/30/2021  ? Chronic pain in right shoulder 12/25/2020  ? Chronic migraine without aura, intractable, without status migrainosus 12/25/2020  ? Memory loss 10/18/2020  ? Ataxia 10/18/2020  ? Frequent falls 10/18/2020  ? Tremor 10/18/2020  ? Moderate recurrent major depression ([place]) 10/18/2020  ? Lumbar back pain 10/18/2020  ? Acute cough 12/08/2019  ? Simple chronic bronchitis ([place]) 07/04/2019  ? Right lumbar radiculopathy 04/23/2019  ? Asthma 09/13/2017  ? COPD with acute exacerbation ([place]) 07/03/2016  ? Hyperlipidemia 07/03/2016  ? Tobacco abuse   ? Sleep disorder 07/02/2016  ? Carotid artery disease ([place]) 05/04/2016  ? Migraine aura without headache 02/08/2014  ? ? ?REFERRING PROVIDER: Alda Berthold, DO ?  ?REFERRING DIAG: Falls  frequently ? ?THERAPY DIAG:  ?Repeated falls ? ?Muscle weakness (generalized) ? ?PERTINENT HISTORY: Frequent falls, history of left breast cancer s/p double mastectomy (2017), depression/anxiety, COPD with asthma, and migraine  ? ?PRECAUTIONS: Fall ? ?SUBJECTIVE: Patient reports no falls since last visit. She states she has stayed busy with work.  ? ?PAIN:  ?Are you having pain? No ?NPRS scale: N/A ?Pain location: N/A ?Pain orientation: N/A ?PAIN TYPE: N/A ?Pain description: N/A ?Aggravating factors: N/A ?Relieving factors: N/A ? ?PATIENT GOALS: Improve balance, fall less ? ? ?OBJECTIVE:  ?PATIENT SURVEYS:  ?FOTO 60% functional status (predicted 60%) ?  ?LE MMT: ?  ?MMT Right ?04/04/2021 Left ?04/04/2021 Rt / Lt ?05/15/2021  ?Hip flexion 4- 4- 4- / 4-  ?Hip extension 3 3 3+ / 3+  ?Hip abduction 3 3 3+ / 3+  ?Knee flexion 4 4 4+ / 4+  ?Knee extension 4+ 4+ 4+ / 4+  ?  ?  FUNCTIONAL TESTS:  ?5 times sit to stand: 10 seconds ?Berg Balance Scale: 48 - 05/15/2021 ?TUG: 9 seconds ?  ?GAIT: ?Assistive device utilized: None ?Level of assistance: Complete Independence ?Comments: Narrow stance, occasionally with unsteady gait especially with turns  ?  ?  ?TODAY'S TREATMENT: ?06/20/2021: ?Therapeutic Exercise: ?NuStep L5 x 5 min with LE only while taking subjective ?SLR 2 x 10 each ?Bridge 2 x 10 ?Hooklying clamshell with red 2 x 10 ?Sidelying hip abduction 2 x 10 each ?LAQ 3# 2 x 15 each ?Standing hamstring curls with 3# 2 x 15 ?Heel raises with 3# 2 x 15 ?Sit to stand holding 8# at chest 3 x 10 ?Forward step-up 6" box 2 x 10 each ?Neuro Re-ed: ?3/4 Tandem on Airex 2 x 30 sec each ?Tandem stance 2 x 30 sec each ?Rockerboard fwd/bwd taps and lateral taps 2 x 1 min each ? ? ?06/13/2021: ?Therapeutic Exercise: ?NuStep L5 x 5 min with LE only while taking subjective ?SLR 2 x 10 each ?Bridge 2 x 10 ?Sidelying hip abduction 2 x 10 each ?LAQ 3# 2 x 15 each ?Standing hamstring curls with 3# 2 x 10 ?Heel raises with 3# 2 x 15 ?Sit to stand  holding 6# at chest 3 x 10 ?Forward step-up 6" box 2 x 10 each ?Neuro Re-ed: ?Tandem stance 2 x 30 sec each ?Rockerboard fwd/bwd taps and lateral taps 2 x 1 min each ? ?06/06/2021: ?Therapeutic Exercise: ?NuStep L5 x 5

## 2021-06-20 ENCOUNTER — Other Ambulatory Visit: Payer: Self-pay

## 2021-06-20 ENCOUNTER — Ambulatory Visit: Payer: Medicare HMO | Admitting: Physical Therapy

## 2021-06-20 ENCOUNTER — Other Ambulatory Visit: Payer: Self-pay | Admitting: Family Medicine

## 2021-06-20 ENCOUNTER — Encounter: Payer: Self-pay | Admitting: Physical Therapy

## 2021-06-20 DIAGNOSIS — M6281 Muscle weakness (generalized): Secondary | ICD-10-CM

## 2021-06-20 DIAGNOSIS — R296 Repeated falls: Secondary | ICD-10-CM | POA: Diagnosis not present

## 2021-06-24 ENCOUNTER — Other Ambulatory Visit: Payer: Self-pay | Admitting: Family Medicine

## 2021-06-28 ENCOUNTER — Other Ambulatory Visit: Payer: Self-pay | Admitting: Family Medicine

## 2021-06-28 NOTE — Therapy (Addendum)
OUTPATIENT PHYSICAL THERAPY TREATMENT NOTE  DISCHARGE   Patient Name: Anne [name] MRN: [id number] DOB:02-Sep-1945, 76 y.o., female Today's Date: 07/03/2021  PCP: [name], MD REFERRING PROVIDER: Alda Berthold, DO   PT End of Session - 07/03/21 1533     Visit Number 10    Number of Visits 13    Date for PT Re-Evaluation 07/10/21    Authorization Type [employer] MCR    Authorization Time Period 05/30/2021 - 07/28/2021    Authorization - Visit Number 4    Authorization - Number of Visits 8    PT Start Time 6384    PT Stop Time 1610    PT Time Calculation (min) 40 min    Activity Tolerance Patient tolerated treatment well    Behavior During Therapy [facility] for tasks assessed/performed                     Past Medical History:  Diagnosis Date   Abnormal MRI of the head 02/08/2014   Formatting of this note might be different from the original. History of abn MRI brain in 2015 due to concussion. Formatting of this note might be different from the original. Overview:  History of abn MRI brain in 2015 due to concussion.   Acquired absence of both breasts and nipples 06/08/2015   Asthma    daily inhalers   Breast cancer ([place]) 05/2015   left   Breast cancer of upper-inner quadrant of left female breast ([place]) 04/15/2015   COPD (chronic obstructive pulmonary disease) ([place])    denies SOB with ADLs; no O2   Dental crowns present    Depression    Family history of breast cancer in female 04/26/2015   Dx. Niece at 75; dx. Maternal aunt in her late 74s  Formatting of this note might be different from the original. Overview:  Dx. Niece at 77; dx. Maternal aunt in her late 23s   Family history of colon cancer 04/26/2015   Dx. In maternal grandfather in his late 77s-70  Formatting of this note might be different from the original. Overview:  Dx. In maternal grandfather in his late 18s-70   Genetic testing 06/02/2015   Negative for pathogenic mutations within any of 20 genes on the  Breast/Ovarian Cancer Panel through [employer].  No variants of uncertain significance (VUSes) were found.  The Breast/Ovarian Cancer Panel offered by [facility] Hope Pigeon, MD) includes sequencing and deletion/duplication analysis for the following 19 genes:  ATM, BARD1, BRCA1, BRCA2, BRIP1, CDH1, CHEK2, FANCC, M   History of basal cell carcinoma 04/26/2015   Dx. 68  Formatting of this note might be different from the original. Overview:  Dx. 68   History of concussion 01/01/2014   History of kidney stones    History of TIA (transient ischemic attack) 10/2014   Hyperlipidemia    Insomnia    Malrotation of intestine 09/23/2017   Migraine    Nasal congestion 05/26/2015   will finish z-pak 05/27/2015   Nonproductive cough 05/26/2015   PONV (postoperative nausea and vomiting)    "long time ago"  none recently   Psychogenic syncope 04/23/2019   RLS (restless legs syndrome)    Tobacco abuse    Vitamin D deficiency    Wears dentures    lower   Wears partial dentures    upper   Weight loss 08/28/2019   Past Surgical History:  Procedure Laterality Date   ABDOMINAL HYSTERECTOMY     APPENDECTOMY  2000  BREAST BIOPSY Left    BREAST RECONSTRUCTION WITH PLACEMENT OF TISSUE EXPANDER AND FLEX HD (ACELLULAR HYDRATED DERMIS) Bilateral 06/02/2015   Procedure: BILATERAL BREAST RECONSTRUCTION WITH PLACEMENT OF TISSUE EXPANDER AND  ACELLULAR HYDRATED DERMIS;  Surgeon: Irene Limbo, MD;  Location: [place];  Service: Plastics;  Laterality: Bilateral;   CARPAL TUNNEL RELEASE Bilateral    CATARACT EXTRACTION Bilateral 09/2013   KNEE ARTHROSCOPY Left    LAPAROSCOPIC ABDOMINAL EXPLORATION N/A 10/2017   LAPAROSCOPIC LYSIS OF CONGENITAL ADHESIVE BAND/LADD'S BANDS WIH INTRAOPERATIVE CHOLANGIOGRAPHY. Perforation of small intestion requiring repair.    LOOP RECORDER INSERTION N/A 05/11/2016   Procedure: Loop Recorder Insertion;  Surgeon: Will Meredith Leeds, MD;  Location: [place] [facility];  Service: Cardiovascular;  Laterality: N/A;   MASTECTOMY W/ SENTINEL NODE BIOPSY Bilateral 06/02/2015   Procedure: BILATERAL MASTECTOMY WITH LEFT SENTINEL LYMPH NODE BIOPSY;  Surgeon: Stark Klein, MD;  Location: [place];  Service: General;  Laterality: Bilateral;   NASAL SEPTUM SURGERY     x 3   PLANTAR FASCIA SURGERY Left    REMOVAL OF BILATERAL TISSUE EXPANDERS WITH PLACEMENT OF BILATERAL BREAST IMPLANTS Bilateral 06/17/2015   Procedure: DEBRIDEMENT OF BILATERAL MASTECTOMY FLAP WITH BILATERAL TISSUE EXPANDER EXCHANGE ;  Surgeon: Irene Limbo, MD;  Location: [place];  Service: Plastics;  Laterality: Bilateral;   REMOVAL OF TISSUE EXPANDER Bilateral 07/12/2015   Procedure: REMOVAL OF BILATERAL TISSUE EXPANDERS;  Surgeon: Irene Limbo, MD;  Location: [place];  Service: Plastics;  Laterality: Bilateral;   Patient Active Problem List   Diagnosis Date Noted   Bipolar disorder, mixed ([place]) 06/16/2021   Acute infection of nasal sinus 05/04/2021   Drug induced constipation 04/10/2021   EIC (epidermal inclusion cyst) 03/21/2021   Acute migraine 01/30/2021   Aortic atherosclerosis ([place]) 01/30/2021   Bilateral carpal tunnel syndrome 01/30/2021   Chronic pain in right shoulder 12/25/2020   Chronic migraine without aura, intractable, without status migrainosus 12/25/2020   Memory loss 10/18/2020   Ataxia 10/18/2020   Frequent falls 10/18/2020   Tremor 10/18/2020   Moderate recurrent major depression ([place]) 10/18/2020   Lumbar back pain 10/18/2020   Acute cough 12/08/2019   Simple chronic bronchitis ([place]) 07/04/2019   Right lumbar radiculopathy 04/23/2019   Asthma 09/13/2017   COPD with acute exacerbation ([place]) 07/03/2016   Hyperlipidemia 07/03/2016   Tobacco abuse    Sleep disorder 07/02/2016   Carotid artery disease ([place]) 05/04/2016   Migraine aura without headache 02/08/2014    REFERRING PROVIDER: Alda Berthold, DO   REFERRING  DIAG: Falls frequently  THERAPY DIAG:  Repeated falls  Muscle weakness (generalized)  PERTINENT HISTORY: Frequent falls, history of left breast cancer s/p double mastectomy (2017), depression/anxiety, COPD with asthma, and migraine   PRECAUTIONS: Fall  SUBJECTIVE: Patient reports no falls since last visit. She has been doing well overall, staying busy with work.  PAIN:  Are you having pain? No NPRS scale: N/A Pain location: N/A Pain orientation: N/A PAIN TYPE: N/A Pain description: N/A Aggravating factors: N/A Relieving factors: N/A  PATIENT GOALS: Improve balance, fall less   OBJECTIVE:  PATIENT SURVEYS:  FOTO 60% functional status (predicted 60%)   LE MMT:   MMT Right 04/04/2021 Left 04/04/2021 Rt / Lt 05/15/2021  Hip flexion 4- 4- 4- / 4-  Hip extension 3 3 3+ / 3+  Hip abduction 3 3 3+ / 3+  Knee flexion 4 4 4+ / 4+  Knee extension 4+ 4+ 4+ /  4+    FUNCTIONAL TESTS:  5 times sit to stand: 10 seconds Berg Balance Scale: 48 - 05/15/2021 TUG: 9 seconds   GAIT: Assistive device utilized: None Level of assistance: Complete Independence Comments: Narrow stance, occasionally with unsteady gait especially with turns      TODAY'S TREATMENT: 07/03/2021: Therapeutic Exercise: NuStep L5 x 5 min with UE/LE only while taking subjective SLR 2 x 10 each Bridge 2 x 10 Hooklying clamshell with green 2 x 10 Sidelying hip abduction 2 x 10 each LAQ 4# 2 x 10 each Standing hamstring curls with 4# 2 x 10 Heel raises with 4# 2 x 15 Sit to stand holding 9# at chest 3 x 10 Neuro Re-ed: 3/4 Tandem on Airex 2 x 30 sec each Tandem stance 3 x 30 sec each Rockerboard fwd/bwd taps and lateral taps 2 x 1 min each   06/20/2021: Therapeutic Exercise: NuStep L5 x 5 min with LE only while taking subjective SLR 2 x 10 each Bridge 2 x 10 Hooklying clamshell with red 2 x 10 Sidelying hip abduction 2 x 10 each LAQ 3# 2 x 15 each Standing hamstring curls with 3# 2 x 15 Heel raises  with 3# 2 x 15 Sit to stand holding 8# at chest 3 x 10 Forward step-up 6" box 2 x 10 each Neuro Re-ed: 3/4 Tandem on Airex 2 x 30 sec each Tandem stance 2 x 30 sec each Rockerboard fwd/bwd taps and lateral taps 2 x 1 min each  06/13/2021: Therapeutic Exercise: NuStep L5 x 5 min with LE only while taking subjective SLR 2 x 10 each Bridge 2 x 10 Sidelying hip abduction 2 x 10 each LAQ 3# 2 x 15 each Standing hamstring curls with 3# 2 x 10 Heel raises with 3# 2 x 15 Sit to stand holding 6# at chest 3 x 10 Forward step-up 6" box 2 x 10 each Neuro Re-ed: Tandem stance 2 x 30 sec each Rockerboard fwd/bwd taps and lateral taps 2 x 1 min each   PATIENT EDUCATION:  Education details: HEP Person educated: Patient Education method: Consulting civil engineer, Demonstration, Corporate treasurer cues, Verbal cues Education comprehension: verbalized understanding, returned demonstration, verbal cues required, tactile cues required, and needs further education   HOME EXERCISE PROGRAM: Access Code: [email or web address]     ASSESSMENT: CLINICAL IMPRESSION: Patient tolerated therapy well with no adverse effects. Therapy continues to focus on balance training and strength progressions. Patient able to tolerate increased resistance and weight with exercises this visit. She does report difficulty and fatigue post therapy. She continues to require occasional UE support with balance challenges. No change to HEP this visit. Patient would benefit from continued skilled PT to progress strength and balance in order to improve gait and reduce fall risk.   Objective impairments include decreased balance and decreased strength.     GOALS: Goals reviewed with patient? Yes   SHORT TERM GOALS:   STG Name Target Date Goal status  1 Patient will be I with initial HEP in order to progress with therapy. Baseline: has not been consistent with exercises due to illness 05/15/2021: patient reports independence with initial HEP 04/25/2021 ACHIEVED     LONG TERM GOALS:    LTG Name Target Date Goal status  1 Patient will be I with final HEP to maintain progress from PT. Baseline: provided at eval 05/15/2021: progressing HEP 07/10/2021 ONGOING  2 Patient will demonstrate BERG balance scale >/= 51/56 to indicate improved balance and reduced fall risk Baseline: 45/56 05/15/2021: 48/56  07/10/2021 PARTIALLY MET  3 Patient will demonstrate improve hip strength grossly >/= 4-/5 MMT in order to improve control and ability to correct balance Baseline: grossly 3/5 MMT 05/15/2021: grossly 3+/5 MMT 07/10/2021 PARTIALLY MET  4 Patient will report no limitation in activity level as a result of balance impairment or falls Baseline: patient currently with functional limitations as a result of balance impairment 05/15/2021: patient currently with functional limitations as a result of balance impairment 07/10/2021 ONGOING      PLAN: PT FREQUENCY: 1x/week   PT DURATION: 8 weeks   PLANNED INTERVENTIONS: Therapeutic exercises, Therapeutic activity, Neuro Muscular re-education, Balance training, Gait training, Patient/Family education, Joint mobilization, Stair training, Aquatic Therapy, Dry Needling, and Manual therapy   PLAN FOR NEXT SESSION: Review HEP and progress PRN, continue LE strengthening and balance training    Hilda Blades, PT, DPT, LAT, ATC 07/03/21  4:13 PM Phone: [phone] Fax: [phone]   PHYSICAL THERAPY DISCHARGE SUMMARY  Visits from Start of Care: 10  Current functional level related to goals / functional outcomes: See above   Remaining deficits: See above   Education / Equipment: HEP   Patient agrees to discharge. Patient goals were partially met. Patient is being discharged due to not returning since the last visit.

## 2021-06-29 ENCOUNTER — Other Ambulatory Visit: Payer: Self-pay | Admitting: Family Medicine

## 2021-06-29 MED ORDER — GABAPENTIN 300 MG PO CAPS: 600.0000 mg | ORAL_CAPSULE | Freq: Three times a day (TID) | ORAL | 1 refills | Status: DC

## 2021-06-29 MED ORDER — QUETIAPINE FUMARATE 100 MG PO TABS: 100.0000 mg | ORAL_TABLET | Freq: Every day | ORAL | 0 refills | Status: DC

## 2021-07-03 ENCOUNTER — Ambulatory Visit: Payer: Medicare HMO | Attending: Neurology | Admitting: Physical Therapy

## 2021-07-03 ENCOUNTER — Encounter: Payer: Self-pay | Admitting: Physical Therapy

## 2021-07-03 ENCOUNTER — Other Ambulatory Visit: Payer: Self-pay

## 2021-07-03 DIAGNOSIS — M6281 Muscle weakness (generalized): Secondary | ICD-10-CM | POA: Insufficient documentation

## 2021-07-03 DIAGNOSIS — R296 Repeated falls: Secondary | ICD-10-CM | POA: Insufficient documentation

## 2021-07-10 ENCOUNTER — Ambulatory Visit: Payer: Medicare HMO | Admitting: Physical Therapy

## 2021-07-10 NOTE — Therapy (Incomplete)
?OUTPATIENT PHYSICAL THERAPY TREATMENT NOTE ? ? ?Patient Name: Anne [name] ?MRN: [id number] ?DOB:04/18/1945, 76 y.o., female ?Today's Date: 07/10/2021 ? ?PCP: [name], MD ?REFERRING PROVIDER: [name], MD ? ? ? ? ? ? ? ? ? ? ? ? ?Past Medical History:  ?Diagnosis Date  ? Abnormal MRI of the head 02/08/2014  ? Formatting of this note might be different from the original. History of abn MRI brain in 2015 due to concussion. Formatting of this note might be different from the original. Overview:  History of abn MRI brain in 2015 due to concussion.  ? Acquired absence of both breasts and nipples 06/08/2015  ? Asthma   ? daily inhalers  ? Breast cancer ([place]) 05/2015  ? left  ? Breast cancer of upper-inner quadrant of left female breast ([place]) 04/15/2015  ? COPD (chronic obstructive pulmonary disease) ([place])   ? denies SOB with ADLs; no O2  ? Dental crowns present   ? Depression   ? Family history of breast cancer in female 04/26/2015  ? Dx. Niece at 57; dx. Maternal aunt in her late 75s  Formatting of this note might be different from the original. Overview:  Dx. Niece at 77; dx. Maternal aunt in her late 13s  ? Family history of colon cancer 04/26/2015  ? Dx. In maternal grandfather in his late 48s-70  Formatting of this note might be different from the original. Overview:  Dx. In maternal grandfather in his late 63s-70  ? Genetic testing 06/02/2015  ? Negative for pathogenic mutations within any of 20 genes on the Breast/Ovarian Cancer Panel through [employer].  No variants of uncertain significance (VUSes) were found.  The Breast/Ovarian Cancer Panel offered by [facility] Hope Pigeon, MD) includes sequencing and deletion/duplication analysis for the following 19 genes:  ATM, BARD1, BRCA1, BRCA2, BRIP1, CDH1, CHEK2, FANCC, M  ? History of basal cell carcinoma 04/26/2015  ? Dx. 62  Formatting of this note might be different from the original. Overview:  Dx. 68  ? History of concussion 01/01/2014  ?  History of kidney stones   ? History of TIA (transient ischemic attack) 10/2014  ? Hyperlipidemia   ? Insomnia   ? Malrotation of intestine 09/23/2017  ? Migraine   ? Nasal congestion 05/26/2015  ? will finish z-pak 05/27/2015  ? Nonproductive cough 05/26/2015  ? PONV (postoperative nausea and vomiting)   ? "long time ago"  none recently  ? Psychogenic syncope 04/23/2019  ? RLS (restless legs syndrome)   ? Tobacco abuse   ? Vitamin D deficiency   ? Wears dentures   ? lower  ? Wears partial dentures   ? upper  ? Weight loss 08/28/2019  ? ?Past Surgical History:  ?Procedure Laterality Date  ? ABDOMINAL HYSTERECTOMY    ? APPENDECTOMY  2000  ? BREAST BIOPSY Left   ? BREAST RECONSTRUCTION WITH PLACEMENT OF TISSUE EXPANDER AND FLEX HD (ACELLULAR HYDRATED DERMIS) Bilateral 06/02/2015  ? Procedure: BILATERAL BREAST RECONSTRUCTION WITH PLACEMENT OF TISSUE EXPANDER AND  ACELLULAR HYDRATED DERMIS;  Surgeon: Irene Limbo, MD;  Location: [facility];  Service: Plastics;  Laterality: Bilateral;  ? CARPAL TUNNEL RELEASE Bilateral   ? CATARACT EXTRACTION Bilateral 09/2013  ? KNEE ARTHROSCOPY Left   ? LAPAROSCOPIC ABDOMINAL EXPLORATION N/A 10/2017  ? LAPAROSCOPIC LYSIS OF CONGENITAL ADHESIVE BAND/LADD'S BANDS WIH INTRAOPERATIVE CHOLANGIOGRAPHY. Perforation of small intestion requiring repair.   ? LOOP RECORDER INSERTION N/A 05/11/2016  ? Procedure: Loop Recorder Insertion;  Surgeon: Will  Meredith Leeds, MD;  Location: [facility];  Service: Cardiovascular;  Laterality: N/A;  ? MASTECTOMY W/ SENTINEL NODE BIOPSY Bilateral 06/02/2015  ? Procedure: BILATERAL MASTECTOMY WITH LEFT SENTINEL LYMPH NODE BIOPSY;  Surgeon: Stark Klein, MD;  Location: [facility];  Service: General;  Laterality: Bilateral;  ? NASAL SEPTUM SURGERY    ? x 3  ? PLANTAR FASCIA SURGERY Left   ? REMOVAL OF BILATERAL TISSUE EXPANDERS WITH PLACEMENT OF BILATERAL BREAST IMPLANTS Bilateral 06/17/2015  ? Procedure: DEBRIDEMENT OF BILATERAL  MASTECTOMY FLAP WITH BILATERAL TISSUE EXPANDER EXCHANGE ;  Surgeon: Irene Limbo, MD;  Location: [place] [facility];  Service: Plastics;  Laterality: Bilateral;  ? REMOVAL OF TISSUE EXPANDER Bilateral 07/12/2015  ? Procedure: REMOVAL OF BILATERAL TISSUE EXPANDERS;  Surgeon: Irene Limbo, MD;  Location: [place];  Service: Plastics;  Laterality: Bilateral;  ? ?Patient Active Problem List  ? Diagnosis Date Noted  ? Bipolar disorder, mixed ([place]) 06/16/2021  ? Acute infection of nasal sinus 05/04/2021  ? Drug induced constipation 04/10/2021  ? EIC (epidermal inclusion cyst) 03/21/2021  ? Acute migraine 01/30/2021  ? Aortic atherosclerosis ([place]) 01/30/2021  ? Bilateral carpal tunnel syndrome 01/30/2021  ? Chronic pain in right shoulder 12/25/2020  ? Chronic migraine without aura, intractable, without status migrainosus 12/25/2020  ? Memory loss 10/18/2020  ? Ataxia 10/18/2020  ? Frequent falls 10/18/2020  ? Tremor 10/18/2020  ? Moderate recurrent major depression ([place]) 10/18/2020  ? Lumbar back pain 10/18/2020  ? Acute cough 12/08/2019  ? Simple chronic bronchitis ([place]) 07/04/2019  ? Right lumbar radiculopathy 04/23/2019  ? Asthma 09/13/2017  ? COPD with acute exacerbation ([place]) 07/03/2016  ? Hyperlipidemia 07/03/2016  ? Tobacco abuse   ? Sleep disorder 07/02/2016  ? Carotid artery disease ([place]) 05/04/2016  ? Migraine aura without headache 02/08/2014  ? ? ?REFERRING PROVIDER: Alda Berthold, DO ?  ?REFERRING DIAG: Falls frequently ? ?THERAPY DIAG:  ?No diagnosis found. ? ?PERTINENT HISTORY: Frequent falls, history of left breast cancer s/p double mastectomy (2017), depression/anxiety, COPD with asthma, and migraine  ? ?PRECAUTIONS: Fall ? ?SUBJECTIVE: Patient reports no falls since last visit. She has been doing well overall, staying busy with work. ? ?PAIN:  ?Are you having pain? No ?NPRS scale: N/A ?Pain location: N/A ?Pain orientation: N/A ?PAIN TYPE: N/A ?Pain description: N/A ?Aggravating factors:  N/A ?Relieving factors: N/A ? ?PATIENT GOALS: Improve balance, fall less ? ? ?OBJECTIVE:  ?PATIENT SURVEYS:  ?FOTO 60% functional status (predicted 60%) ?  ?LE MMT: ?  ?MMT Right ?04/04/2021 Left ?04/04/2021 Rt / Lt ?05/15/2021  ?Hip flexion 4- 4- 4- / 4-  ?Hip extension 3 3 3+ / 3+  ?Hip abduction 3 3 3+ / 3+  ?Knee flexion 4 4 4+ / 4+  ?Knee extension 4+ 4+ 4+ / 4+  ?  ?FUNCTIONAL TESTS:  ?5 times sit to stand: 10 seconds ?Berg Balance Scale: 48 - 05/15/2021 ?TUG: 9 seconds ?  ?GAIT: ?Assistive device utilized: None ?Level of assistance: Complete Independence ?Comments: Narrow stance, occasionally with unsteady gait especially with turns  ?  ?  ?TODAY'S TREATMENT: ?07/03/2021: ?Therapeutic Exercise: ?NuStep L5 x 5 min with UE/LE only while taking subjective ?SLR 2 x 10 each ?Bridge 2 x 10 ?Hooklying clamshell with green 2 x 10 ?Sidelying hip abduction 2 x 10 each ?LAQ 4# 2 x 10 each ?Standing hamstring curls with 4# 2 x 10 ?Heel raises with 4# 2 x 15 ?Sit to stand holding 9# at chest  3 x 10 ?Neuro Re-ed: ?3/4 Tandem on Airex 2 x 30 sec each ?Tandem stance 3 x 30 sec each ?Rockerboard fwd/bwd taps and lateral taps 2 x 1 min each ? ? ?06/20/2021: ?Therapeutic Exercise: ?NuStep L5 x 5 min with LE only while taking subjective ?SLR 2 x 10 each ?Bridge 2 x 10 ?Hooklying clamshell with red 2 x 10 ?Sidelying hip abduction 2 x 10 each ?LAQ 3# 2 x 15 each ?Standing hamstring curls with 3# 2 x 15 ?Heel raises with 3# 2 x 15 ?Sit to stand holding 8# at chest 3 x 10 ?Forward step-up 6" box 2 x 10 each ?Neuro Re-ed: ?3/4 Tandem on Airex 2 x 30 sec each ?Tandem stance 2 x 30 sec each ?Rockerboard fwd/bwd taps and lateral taps 2 x 1 min each ? ?06/13/2021: ?Therapeutic Exercise: ?NuStep L5 x 5 min with LE only while taking subjective ?SLR 2 x 10 each ?Bridge 2 x 10 ?Sidelying hip abduction 2 x 10 each ?LAQ 3# 2 x 15 each ?Standing hamstring curls with 3# 2 x 10 ?Heel raises with 3# 2 x 15 ?Sit to stand holding 6# at chest 3 x 10 ?Forward  step-up 6" box 2 x 10 each ?Neuro Re-ed: ?Tandem stance 2 x 30 sec each ?Rockerboard fwd/bwd taps and lateral taps 2 x 1 min each ?  ?PATIENT EDUCATION:  ?Education details: HEP ?Person educated: Patient ?Educatio

## 2021-07-11 ENCOUNTER — Ambulatory Visit (INDEPENDENT_AMBULATORY_CARE_PROVIDER_SITE_OTHER): Payer: Medicare HMO | Admitting: Family Medicine

## 2021-07-11 ENCOUNTER — Encounter: Payer: Self-pay | Admitting: Family Medicine

## 2021-07-11 VITALS — BP 138/78 | HR 86 | Temp 97.4°F | Ht 61.0 in | Wt 81.0 lb

## 2021-07-11 DIAGNOSIS — B3789 Other sites of candidiasis: Secondary | ICD-10-CM

## 2021-07-11 DIAGNOSIS — R634 Abnormal weight loss: Secondary | ICD-10-CM | POA: Diagnosis not present

## 2021-07-11 DIAGNOSIS — F331 Major depressive disorder, recurrent, moderate: Secondary | ICD-10-CM | POA: Diagnosis not present

## 2021-07-11 DIAGNOSIS — E43 Unspecified severe protein-calorie malnutrition: Secondary | ICD-10-CM | POA: Diagnosis not present

## 2021-07-11 DIAGNOSIS — R1084 Generalized abdominal pain: Secondary | ICD-10-CM | POA: Diagnosis not present

## 2021-07-11 MED ORDER — FLUCONAZOLE 100 MG PO TABS: ORAL_TABLET | ORAL | 0 refills | Status: AC

## 2021-07-11 NOTE — Patient Instructions (Addendum)
Increase seroquel to 200 mg before bed x 1 week, then increase to 300 mg before bed.  ? ?Keep food Log. Eat 3 meals per day.  ?Drink a high protein shake twice a day.  ? ?Refer to GI.  ? ?Ordering ct of abd/pelvis.  ? ?Thrush: fluconazole prescription.  ? ?

## 2021-07-11 NOTE — Progress Notes (Unsigned)
Subjective:  Patient ID: Anne [name], female    DOB: 1946-01-16  Age: 76 y.o. MRN: [id number]  Chief Complaint  Patient presents with   Depression    6 week follow up   HPI: Depression: 6 week follow up Patient is taking quetiapine 100 mg before bed.  Patient is very concerned about the weight loss. Has lost 8 lbs more.    Current Outpatient Medications on File Prior to Visit  Medication Sig Dispense Refill   albuterol (VENTOLIN HFA) 108 (90 Base) MCG/ACT inhaler INHALE 1 TO 2 PUFFS BY MOUTH EVERY 6 HOURS AS NEEDED FOR WHEEZING FOR SHORTNESS OF BREATH 18 g 0   amitriptyline (ELAVIL) 75 MG tablet TAKE 1 TABLET (75 MG TOTAL) BY MOUTH DAILY. 90 tablet 0   Atogepant ([name]) 60 MG TABS Take 60 mg by mouth daily. 90 tablet 3   cetirizine (ZYRTEC) 10 MG tablet Take 1 tablet (10 mg total) by mouth daily. 30 tablet 11   Fluticasone-Umeclidin-Vilant (TRELEGY ELLIPTA) 200-62.5-25 MCG/ACT AEPB Inhale 1 puff into the lungs daily. 3 each 3   furosemide (LASIX) 20 MG tablet Take 1 tablet (20 mg total) by mouth daily as needed for edema (for leg swelling). 90 tablet 1   gabapentin (NEURONTIN) 300 MG capsule Take 2 capsules (600 mg total) by mouth 3 (three) times daily. 180 capsule 1   HYDROcodone-acetaminophen (NORCO/VICODIN) 5-325 MG tablet Take 1 tablet by mouth daily as needed for severe pain (migraines). 30 tablet 0   Melatonin 1 MG CAPS Take by mouth.     meloxicam (MOBIC) 15 MG tablet TAKE 1 TABLET EVERY DAY 90 tablet 1   mirtazapine (REMERON) 45 MG tablet TAKE 1 TABLET AT BEDTIME 90 tablet 1   omeprazole (PRILOSEC) 40 MG capsule TAKE 1 CAPSULE TWICE DAILY 180 capsule 1   orphenadrine (NORFLEX) 100 MG tablet TAKE 1 TABLET (100 MG TOTAL) BY MOUTH 2 (TWO) TIMES DAILY AS NEEDED FOR MUSCLE SPASMS. 180 tablet 0   Plecanatide (TRULANCE) 3 MG TABS Take 1 tablet by mouth daily. 12 tablet 0   Plecanatide (TRULANCE) 3 MG TABS Take 1 tablet by mouth daily. 90 tablet 3   primidone (MYSOLINE) 50 MG  tablet TAKE 1 TABLET BY MOUTH IN THE MORNING AND TAKE 1 TABLET AT BEDTIME 180 tablet 0   propranolol (INDERAL) 40 MG tablet Take 1 tablet (40 mg total) by mouth 3 (three) times daily. 270 tablet 1   QUEtiapine (SEROQUEL) 100 MG tablet Take 1 tablet (100 mg total) by mouth at bedtime. 90 tablet 0   rizatriptan (MAXALT-MLT) 10 MG disintegrating tablet DISSOLVE 1 TABLET ON THE TONGUE AT ONSET OF MIGRAINE, REPEAT IN 2 HOURS IF NEEDED 10 tablet 1   traZODone (DESYREL) 100 MG tablet TAKE 1 TABLET (100 MG TOTAL) BY MOUTH AT BEDTIME AS NEEDED FOR SLEEP. 90 tablet 1   No current facility-administered medications on file prior to visit.   Past Medical History:  Diagnosis Date   Acquired absence of both breasts and nipples 06/08/2015   Acute infection of nasal sinus 05/04/2021   Aortic atherosclerosis 01/30/2021   Asthma    daily inhalers   Ataxia 10/18/2020   Bilateral carpal tunnel syndrome 01/30/2021   Bipolar disorder, mixed 06/16/2021   Breast cancer of upper-inner quadrant of left female breast 04/15/2015   Carotid artery disease 05/04/2016   Mild, 1-39% bilaterally by doppler [name] 2018   Chronic pain in right shoulder 12/25/2020   COPD with acute exacerbation 07/03/2016  Dental crowns present    Drug induced constipation 04/10/2021   EIC (epidermal inclusion cyst) 03/21/2021   Family history of breast cancer in female 04/26/2015   Dx. Niece at 14; dx. Maternal aunt in her late 35s  Formatting of this note might be different from the original. Overview:  Dx. Niece at 18; dx. Maternal aunt in her late 49s   Family history of colon cancer 04/26/2015   Dx. In maternal grandfather in his late 46s-70  Formatting of this note might be different from the original. Overview:  Dx. In maternal grandfather in his late 34s-70   Frequent falls 10/18/2020   Genetic testing 06/02/2015   Negative for pathogenic mutations within any of 20 genes on the Breast/Ovarian Cancer Panel through [employer].  No variants of uncertain significance (VUSes) were found.  The Breast/Ovarian Cancer Panel offered by [facility] Hope Pigeon, MD) includes sequencing and deletion/duplication analysis for the following 19 genes:  ATM, BARD1, BRCA1, BRCA2, BRIP1, CDH1, CHEK2, FANCC, M   History of basal cell carcinoma 04/26/2015   History of concussion 01/01/2014   History of kidney stones    History of TIA (transient ischemic attack) 10/2014   Hyperlipidemia    Insomnia    Lumbar back pain 10/18/2020   Major depressive disorder 10/18/2020   Malrotation of intestine 09/23/2017   Migraine aura without headache 02/08/2014   Migraine headache 01/30/2021   Nasal congestion 05/26/2015   will finish z-pak 05/27/2015   Nonproductive cough 05/26/2015   PONV (postoperative nausea and vomiting)    "long time ago"  none recently   Psychogenic syncope 04/23/2019   Right lumbar radiculopathy 04/23/2019   RLS (restless legs syndrome)    Simple chronic bronchitis 07/04/2019   Tobacco use    Tremor 10/18/2020   Vitamin D deficiency    Wears dentures    Weight loss 08/28/2019   Past Surgical History:  Procedure Laterality Date   ABDOMINAL HYSTERECTOMY     APPENDECTOMY  2000   BREAST BIOPSY Left    BREAST RECONSTRUCTION WITH PLACEMENT OF TISSUE EXPANDER AND FLEX HD (ACELLULAR HYDRATED DERMIS) Bilateral 06/02/2015   Procedure: BILATERAL BREAST RECONSTRUCTION WITH PLACEMENT OF TISSUE EXPANDER AND  ACELLULAR HYDRATED DERMIS;  Surgeon: Irene Limbo, MD;  Location: [facility];  Service: Plastics;  Laterality: Bilateral;   CARPAL TUNNEL RELEASE Bilateral    CATARACT EXTRACTION Bilateral 09/2013   KNEE ARTHROSCOPY Left    LAPAROSCOPIC ABDOMINAL EXPLORATION N/A 10/2017   LAPAROSCOPIC LYSIS OF CONGENITAL ADHESIVE BAND/LADD'S BANDS WIH INTRAOPERATIVE CHOLANGIOGRAPHY. Perforation of small intestion requiring repair.    LOOP RECORDER INSERTION N/A 05/11/2016   Procedure: Loop Recorder  Insertion;  Surgeon: Will Meredith Leeds, MD;  Location: [facility];  Service: Cardiovascular;  Laterality: N/A;   MASTECTOMY W/ SENTINEL NODE BIOPSY Bilateral 06/02/2015   Procedure: BILATERAL MASTECTOMY WITH LEFT SENTINEL LYMPH NODE BIOPSY;  Surgeon: Stark Klein, MD;  Location: [facility];  Service: General;  Laterality: Bilateral;   NASAL SEPTUM SURGERY     x 3   PLANTAR FASCIA SURGERY Left    REMOVAL OF BILATERAL TISSUE EXPANDERS WITH PLACEMENT OF BILATERAL BREAST IMPLANTS Bilateral 06/17/2015   Procedure: DEBRIDEMENT OF BILATERAL MASTECTOMY FLAP WITH BILATERAL TISSUE EXPANDER EXCHANGE ;  Surgeon: Irene Limbo, MD;  Location: [facility];  Service: Plastics;  Laterality: Bilateral;   REMOVAL OF TISSUE EXPANDER Bilateral 07/12/2015   Procedure: REMOVAL OF BILATERAL TISSUE EXPANDERS;  Surgeon: Irene Limbo, MD;  Location: [facility];  Service: Clinical cytogeneticist;  Laterality: Bilateral;    Family History  Problem Relation Age of Onset   Cervical cancer Mother 32   Colon cancer Maternal Grandfather        mets to stomach; dx. 67-70   Heart disease Father    Prostate cancer Father 45   Cervical cancer Maternal Grandmother        dx. 70s; treated with radium implant   Lung cancer Sister 88       maternal half-sister dx. lung cancer, stage III   Breast cancer Maternal Aunt    Cervical cancer Sister 3       paternal half-sister; s/p TAH   Spina bifida Grandchild    Brain cancer Grandchild        grandson dx. at 20 mos, treated at [facility] Other 9       maternal half-sister's daughter   Cancer Maternal Aunt        d. 72; unspecified type - "began at back area and moved to vital organs"   Cancer Maternal Uncle         late 70s; unspecified type; "started at back and moved to vital organs"   Social History   Socioeconomic History   Marital status: Widowed    Spouse name: Not on file   Number of children: 1   Years of education: 76   Highest  education level: Not on file  Occupational History    Comment: [employer], retired  Tobacco Use   Smoking status: Former    Packs/day: 0.25    Types: Cigarettes    Quit date: 03/21/2020    Years since quitting: 1.3   Smokeless tobacco: Never  Vaping Use   Vaping Use: Never used  Substance and Sexual Activity   Alcohol use: Yes    Alcohol/week: 0.0 standard drinks    Comment: rarely   Drug use: No   Sexual activity: Not on file  Other Topics Concern   Not on file  Social History Narrative   Lives alone, widow   Caffeine use- tea, 1 cup daily   Right Handed    Lives in a one story [place] home    Social Determinants of Health   Financial Resource Strain: Low Risk    Difficulty of Paying Living Expenses: Not hard at all  Food Insecurity: Not on file  Transportation Needs: No Transportation Needs   Lack of Transportation (Medical): No   Lack of Transportation (Non-Medical): No  Physical Activity: Not on file  Stress: Not on file  Social Connections: Not on file    Review of Systems  Constitutional:  Positive for appetite change (Still not having an appetite or wanting food.). Negative for fatigue and fever.  HENT:  Negative for congestion, ear pain, sinus pressure and sore throat.   Respiratory:  Negative for cough, chest tightness, shortness of breath and wheezing.   Cardiovascular:  Negative for chest pain and palpitations.  Gastrointestinal:  Negative for abdominal pain, constipation, diarrhea, nausea and vomiting.  Genitourinary:  Negative for dysuria and hematuria.  Musculoskeletal:  Negative for arthralgias, back pain, joint swelling and myalgias.  Skin:  Negative for rash.  Neurological:  Negative for dizziness, weakness and headaches.  Psychiatric/Behavioral:  Positive for dysphoric mood. The patient is not nervous/anxious.     Objective:  BP 138/78 (BP Location: Right Arm, Patient Position: Sitting)   Pulse 86   Temp (!) 97.4 F (36.3 C) (Temporal)  Ht 5' 1"  (1.549 m)   Wt 81 lb (36.7 kg)   SpO2 98%   BMI 15.30 kg/m      07/11/2021    3:24 PM 06/01/2021    3:48 PM 05/04/2021    8:49 AM  BP/Weight  Systolic BP 706 237 628  Diastolic BP 78 50 52  Wt. (Lbs) 81 86.8 86.2  BMI 15.3 kg/m2 16.4 kg/m2 16.29 kg/m2    Physical Exam Vitals reviewed.  Constitutional:      Appearance: Normal appearance. She is normal weight.  HENT:     Mouth/Throat:     Pharynx: No oropharyngeal exudate or posterior oropharyngeal erythema.     Comments: Erythema -mild. No obvious thrush.  Cardiovascular:     Rate and Rhythm: Normal rate and regular rhythm.     Pulses: Normal pulses.     Heart sounds: Normal heart sounds.  Pulmonary:     Effort: Pulmonary effort is normal.     Breath sounds: Normal breath sounds.  Abdominal:     General: Abdomen is flat. Bowel sounds are normal.     Palpations: Abdomen is soft.     Tenderness: There is abdominal tenderness (diffuse.).  Neurological:     Mental Status: She is alert and oriented to person, place, and time.  Psychiatric:        Behavior: Behavior normal.     Comments: Depressed.    Diabetic Foot Exam - Simple   No data filed      Lab Results  Component Value Date   WBC 8.1 07/11/2021   HGB 10.8 (L) 07/11/2021   HCT 33.8 (L) 07/11/2021   PLT 258 07/11/2021   GLUCOSE 71 07/11/2021   CHOL 132 01/30/2021   TRIG 133 01/30/2021   HDL 64 01/30/2021   LDLCALC 45 01/30/2021   ALT 14 07/11/2021   AST 22 07/11/2021   NA 140 07/11/2021   K 3.6 07/11/2021   CL 103 07/11/2021   CREATININE 0.57 07/11/2021   BUN 13 07/11/2021   CO2 24 07/11/2021   TSH 1.220 07/11/2021      Assessment & Plan:   Problem List Items Addressed This Visit       Respiratory   Acute pharyngeal candidiasis    fluconazole prescription.      Relevant Orders   Ambulatory referral to Gastroenterology     Other   Weight loss - Primary    Refer to GI.  Ordering ct of abd/pelvis.       Relevant Orders    CT ABDOMEN PELVIS W CONTRAST (Completed)   Ambulatory referral to Gastroenterology   Major depressive disorder    Increase seroquel to 200 mg before bed x 1 week, then increase to 300 mg before bed.         Severe protein-calorie malnutrition [employer] Oiler: less than 60% of standard weight) ([place])    Keep food Log. Eat 3 meals per day.  Drink a high protein shake twice a day.         Relevant Orders   VITAMIN D 25 Hydroxy (Vit-D Deficiency, Fractures) (Completed)   CBC with Differential/Platelet (Completed)   Comprehensive metabolic panel (Completed)   T4, free (Completed)   TSH (Completed)   CT ABDOMEN PELVIS W CONTRAST (Completed)   Ambulatory referral to Gastroenterology   Generalized abdominal pain    Refer to GI.  Ordering ct of abd/pelvis.       Relevant Orders   CT ABDOMEN PELVIS W CONTRAST (Completed)  Ambulatory referral to Gastroenterology  .  Meds ordered this encounter  Medications   fluconazole (DIFLUCAN) 100 MG tablet    Sig: Take 2 tablets (200 mg total) by mouth daily for 1 day, THEN 1 tablet (100 mg total) daily for 13 days.    Dispense:  15 tablet    Refill:  0    Orders Placed This Encounter  Procedures   CT ABDOMEN PELVIS W CONTRAST   VITAMIN D 25 Hydroxy (Vit-D Deficiency, Fractures)   CBC with Differential/Platelet   Comprehensive metabolic panel   T4, free   TSH   Ambulatory referral to Gastroenterology     Follow-up: Return in about 4 weeks (around 08/08/2021) for chronic follow up.  An After Visit Summary was printed and given to the patient.  I,Baron Parmelee M Yuliana Vandrunen,acting as a scribe for [name], MD.,have documented all relevant documentation on the behalf of [name], MD,as directed by  [name], MD while in the presence of [name], MD.   [name], MD [place] [phone]

## 2021-07-12 ENCOUNTER — Encounter: Payer: Self-pay | Admitting: Psychology

## 2021-07-12 ENCOUNTER — Encounter: Payer: Medicare HMO | Admitting: Psychology

## 2021-07-12 DIAGNOSIS — Z029 Encounter for administrative examinations, unspecified: Secondary | ICD-10-CM

## 2021-07-12 DIAGNOSIS — G4700 Insomnia, unspecified: Secondary | ICD-10-CM | POA: Insufficient documentation

## 2021-07-12 LAB — CBC WITH DIFFERENTIAL/PLATELET
Basophils Absolute: 0.1 10*3/uL (ref 0.0–0.2)
Basos: 1 %
EOS (ABSOLUTE): 0.2 10*3/uL (ref 0.0–0.4)
Eos: 3 %
Hematocrit: 33.8 % — ABNORMAL LOW (ref 34.0–46.6)
Hemoglobin: 10.8 g/dL — ABNORMAL LOW (ref 11.1–15.9)
Immature Grans (Abs): 0 10*3/uL (ref 0.0–0.1)
Immature Granulocytes: 0 %
Lymphocytes Absolute: 1.7 10*3/uL (ref 0.7–3.1)
Lymphs: 21 %
MCH: 28.6 pg (ref 26.6–33.0)
MCHC: 32 g/dL (ref 31.5–35.7)
MCV: 90 fL (ref 79–97)
Monocytes Absolute: 0.8 10*3/uL (ref 0.1–0.9)
Monocytes: 10 %
Neutrophils Absolute: 5.3 10*3/uL (ref 1.4–7.0)
Neutrophils: 65 %
Platelets: 258 10*3/uL (ref 150–450)
RBC: 3.77 x10E6/uL (ref 3.77–5.28)
RDW: 13.3 % (ref 11.7–15.4)
WBC: 8.1 10*3/uL (ref 3.4–10.8)

## 2021-07-12 LAB — COMPREHENSIVE METABOLIC PANEL
ALT: 14 IU/L (ref 0–32)
AST: 22 IU/L (ref 0–40)
Albumin/Globulin Ratio: 1.4 (ref 1.2–2.2)
Albumin: 3.4 g/dL — ABNORMAL LOW (ref 3.7–4.7)
Alkaline Phosphatase: 118 IU/L (ref 44–121)
BUN/Creatinine Ratio: 23 (ref 12–28)
BUN: 13 mg/dL (ref 8–27)
Bilirubin Total: <0.2 mg/dL (ref 0.0–1.2)
CO2: 24 mmol/L (ref 20–29)
Calcium: 8.8 mg/dL (ref 8.7–10.3)
Chloride: 103 mmol/L (ref 96–106)
Creatinine, Ser: 0.57 mg/dL (ref 0.57–1.00)
Globulin, Total: 2.4 g/dL (ref 1.5–4.5)
Glucose: 71 mg/dL (ref 70–99)
Potassium: 3.6 mmol/L (ref 3.5–5.2)
Sodium: 140 mmol/L (ref 134–144)
Total Protein: 5.8 g/dL — ABNORMAL LOW (ref 6.0–8.5)
eGFR: 95 mL/min/{1.73_m2} (ref >59)

## 2021-07-12 LAB — T4, FREE: Free T4: 1.01 ng/dL (ref 0.82–1.77)

## 2021-07-12 LAB — TSH: TSH: 1.22 u[IU]/mL (ref 0.450–4.500)

## 2021-07-12 LAB — VITAMIN D 25 HYDROXY (VIT D DEFICIENCY, FRACTURES): Vit D, 25-Hydroxy: 38.4 ng/mL (ref 30.0–100.0)

## 2021-07-12 NOTE — Progress Notes (Signed)
Hb little low. Recheck in one month.  ?Liver function normal.  ?Kidney function normal.  ?Thyroid function normal.  ?Vitamin D normal.  ?

## 2021-07-17 DIAGNOSIS — K6389 Other specified diseases of intestine: Secondary | ICD-10-CM | POA: Diagnosis not present

## 2021-07-17 DIAGNOSIS — I700 Atherosclerosis of aorta: Secondary | ICD-10-CM | POA: Diagnosis not present

## 2021-07-17 DIAGNOSIS — E43 Unspecified severe protein-calorie malnutrition: Secondary | ICD-10-CM | POA: Diagnosis not present

## 2021-07-17 DIAGNOSIS — R1084 Generalized abdominal pain: Secondary | ICD-10-CM | POA: Diagnosis not present

## 2021-07-17 DIAGNOSIS — N281 Cyst of kidney, acquired: Secondary | ICD-10-CM | POA: Diagnosis not present

## 2021-07-17 DIAGNOSIS — R634 Abnormal weight loss: Secondary | ICD-10-CM | POA: Diagnosis not present

## 2021-07-19 ENCOUNTER — Other Ambulatory Visit: Payer: Self-pay

## 2021-07-19 ENCOUNTER — Encounter: Payer: Medicare HMO | Admitting: Psychology

## 2021-07-19 DIAGNOSIS — E43 Unspecified severe protein-calorie malnutrition: Secondary | ICD-10-CM

## 2021-07-19 DIAGNOSIS — R1084 Generalized abdominal pain: Secondary | ICD-10-CM

## 2021-07-19 DIAGNOSIS — R634 Abnormal weight loss: Secondary | ICD-10-CM

## 2021-07-19 MED ORDER — METRONIDAZOLE 500 MG PO TABS
500.0000 mg | ORAL_TABLET | Freq: Two times a day (BID) | ORAL | 0 refills | Status: DC
Start: 2021-07-19 — End: 2021-08-14

## 2021-07-19 MED ORDER — CIPROFLOXACIN HCL 500 MG PO TABS: 500.0000 mg | ORAL_TABLET | Freq: Two times a day (BID) | ORAL | 0 refills | Status: DC

## 2021-07-20 ENCOUNTER — Telehealth: Payer: Self-pay

## 2021-07-20 NOTE — Telephone Encounter (Signed)
Left message for pt to call back to schedule an appointment in regard to secure chat between Dr. Dirk Dress and Dr. Lyndel Safe:

## 2021-07-21 NOTE — Telephone Encounter (Signed)
Left message for pt to call back to schedule an appointment in regard to secure chat between Dr. Dirk Dress and Dr. Lyndel Safe:

## 2021-07-24 NOTE — Telephone Encounter (Signed)
Left message for pt to call back to schedule an appointment in regard to secure chat between Dr. Dirk Dress and Dr. Lyndel Safe:

## 2021-07-26 NOTE — Telephone Encounter (Signed)
Voice Mail Box full: Unable to leave message

## 2021-07-28 NOTE — Telephone Encounter (Signed)
Voice Mail Box full: Unable to leave message Left message for [name] to call back:

## 2021-07-30 DIAGNOSIS — E43 Unspecified severe protein-calorie malnutrition: Secondary | ICD-10-CM | POA: Insufficient documentation

## 2021-07-30 DIAGNOSIS — R1084 Generalized abdominal pain: Secondary | ICD-10-CM | POA: Insufficient documentation

## 2021-07-30 DIAGNOSIS — B3789 Other sites of candidiasis: Secondary | ICD-10-CM | POA: Insufficient documentation

## 2021-07-30 NOTE — Assessment & Plan Note (Signed)
Keep food Log. Eat 3 meals per day.  Drink a high protein shake twice a day.

## 2021-07-30 NOTE — Assessment & Plan Note (Signed)
Refer to GI.  Ordering ct of abd/pelvis.

## 2021-07-30 NOTE — Assessment & Plan Note (Signed)
Increase seroquel to 200 mg before bed x 1 week, then increase to 300 mg before bed.

## 2021-07-30 NOTE — Assessment & Plan Note (Signed)
fluconazole prescription.

## 2021-08-08 DIAGNOSIS — R4182 Altered mental status, unspecified: Secondary | ICD-10-CM | POA: Diagnosis not present

## 2021-08-08 DIAGNOSIS — Z79899 Other long term (current) drug therapy: Secondary | ICD-10-CM | POA: Diagnosis not present

## 2021-08-08 DIAGNOSIS — F32A Depression, unspecified: Secondary | ICD-10-CM | POA: Diagnosis not present

## 2021-08-08 DIAGNOSIS — R634 Abnormal weight loss: Secondary | ICD-10-CM | POA: Diagnosis not present

## 2021-08-08 DIAGNOSIS — Z681 Body mass index (BMI) 19 or less, adult: Secondary | ICD-10-CM | POA: Diagnosis not present

## 2021-08-08 DIAGNOSIS — F419 Anxiety disorder, unspecified: Secondary | ICD-10-CM | POA: Diagnosis not present

## 2021-08-08 DIAGNOSIS — R410 Disorientation, unspecified: Secondary | ICD-10-CM | POA: Diagnosis not present

## 2021-08-08 DIAGNOSIS — F17210 Nicotine dependence, cigarettes, uncomplicated: Secondary | ICD-10-CM | POA: Diagnosis not present

## 2021-08-08 DIAGNOSIS — Z9013 Acquired absence of bilateral breasts and nipples: Secondary | ICD-10-CM | POA: Diagnosis not present

## 2021-08-08 DIAGNOSIS — J449 Chronic obstructive pulmonary disease, unspecified: Secondary | ICD-10-CM | POA: Diagnosis not present

## 2021-08-08 DIAGNOSIS — Z7409 Other reduced mobility: Secondary | ICD-10-CM | POA: Diagnosis not present

## 2021-08-09 DIAGNOSIS — Z681 Body mass index (BMI) 19 or less, adult: Secondary | ICD-10-CM | POA: Insufficient documentation

## 2021-08-09 DIAGNOSIS — Z853 Personal history of malignant neoplasm of breast: Secondary | ICD-10-CM | POA: Diagnosis not present

## 2021-08-09 DIAGNOSIS — J449 Chronic obstructive pulmonary disease, unspecified: Secondary | ICD-10-CM | POA: Diagnosis not present

## 2021-08-09 DIAGNOSIS — F419 Anxiety disorder, unspecified: Secondary | ICD-10-CM | POA: Diagnosis not present

## 2021-08-09 DIAGNOSIS — R634 Abnormal weight loss: Secondary | ICD-10-CM | POA: Diagnosis not present

## 2021-08-09 DIAGNOSIS — R410 Disorientation, unspecified: Secondary | ICD-10-CM | POA: Diagnosis not present

## 2021-08-09 DIAGNOSIS — R4182 Altered mental status, unspecified: Secondary | ICD-10-CM | POA: Diagnosis not present

## 2021-08-09 DIAGNOSIS — F32A Depression, unspecified: Secondary | ICD-10-CM | POA: Diagnosis not present

## 2021-08-09 DIAGNOSIS — Z720 Tobacco use: Secondary | ICD-10-CM | POA: Diagnosis not present

## 2021-08-09 DIAGNOSIS — R413 Other amnesia: Secondary | ICD-10-CM | POA: Insufficient documentation

## 2021-08-09 NOTE — Therapy (Incomplete)
OUTPATIENT PHYSICAL THERAPY TREATMENT NOTE   Patient Name: Anne [name] MRN: [id number] DOB:[birth year], 76 y.o., female Today's Date: 08/09/2021  PCP: [name], MD REFERRING PROVIDER: [name], MD             Past Medical History:  Diagnosis Date   Acquired absence of both breasts and nipples 06/08/2015   Acute infection of nasal sinus 05/04/2021   Aortic atherosclerosis 01/30/2021   Asthma    daily inhalers   Ataxia 10/18/2020   Bilateral carpal tunnel syndrome 01/30/2021   Bipolar disorder, mixed 06/16/2021   Breast cancer of upper-inner quadrant of left female breast 04/15/2015   Carotid artery disease 05/04/2016   Mild, 1-39% bilaterally by doppler in March 2018   Chronic pain in right shoulder 12/25/2020   COPD with acute exacerbation 07/03/2016   Dental crowns present    Drug induced constipation 04/10/2021   EIC (epidermal inclusion cyst) 03/21/2021   Family history of breast cancer in female 04/26/2015   Dx. Niece at 79; dx. Maternal aunt in her late 59s  Formatting of this note might be different from the original. Overview:  Dx. Niece at 12; dx. Maternal aunt in her late 44s   Family history of colon cancer 04/26/2015   Dx. In maternal grandfather in his late 60s-70  Formatting of this note might be different from the original. Overview:  Dx. In maternal grandfather in his late 66s-70   Frequent falls 10/18/2020   Genetic testing 06/02/2015   Negative for pathogenic mutations within any of 20 genes on the Breast/Ovarian Cancer Panel through [employer].  No variants of uncertain significance (VUSes) were found.  The Breast/Ovarian Cancer Panel offered by [facility] [place], MD) includes sequencing and deletion/duplication analysis for the following 19 genes:  ATM, BARD1, BRCA1, BRCA2, BRIP1, CDH1, CHEK2, FANCC, M   History of basal cell carcinoma 04/26/2015   History of concussion 01/01/2014   History of kidney stones     History of TIA (transient ischemic attack) 10/2014   Hyperlipidemia    Insomnia    Lumbar back pain 10/18/2020   Major depressive disorder 10/18/2020   Malrotation of intestine 09/23/2017   Migraine aura without headache 02/08/2014   Migraine headache 01/30/2021   Nasal congestion 05/26/2015   will finish z-pak 05/27/2015   Nonproductive cough 05/26/2015   PONV (postoperative nausea and vomiting)    "long time ago"  none recently   Psychogenic syncope 04/23/2019   Right lumbar radiculopathy 04/23/2019   RLS (restless legs syndrome)    Simple chronic bronchitis 07/04/2019   Tobacco use    Tremor 10/18/2020   Vitamin D deficiency    Wears dentures    Weight loss 08/28/2019   Past Surgical History:  Procedure Laterality Date   ABDOMINAL HYSTERECTOMY     APPENDECTOMY  2000   BREAST BIOPSY Left    BREAST RECONSTRUCTION WITH PLACEMENT OF TISSUE EXPANDER AND FLEX HD (ACELLULAR HYDRATED DERMIS) Bilateral 06/02/2015   Procedure: BILATERAL BREAST RECONSTRUCTION WITH PLACEMENT OF TISSUE EXPANDER AND  ACELLULAR HYDRATED DERMIS;  Surgeon: Irene Limbo, MD;  Location: [facility];  Service: Plastics;  Laterality: Bilateral;   CARPAL TUNNEL RELEASE Bilateral    CATARACT EXTRACTION Bilateral 09/2013   KNEE ARTHROSCOPY Left    LAPAROSCOPIC ABDOMINAL EXPLORATION N/A 10/2017   LAPAROSCOPIC LYSIS OF CONGENITAL ADHESIVE BAND/LADD'S BANDS WIH INTRAOPERATIVE CHOLANGIOGRAPHY. Perforation of small intestion requiring repair.    LOOP RECORDER INSERTION N/A 05/11/2016   Procedure: Loop Recorder Insertion;  Surgeon:  Will Meredith Leeds, MD;  Location: [facility];  Service: Cardiovascular;  Laterality: N/A;   MASTECTOMY W/ SENTINEL NODE BIOPSY Bilateral 06/02/2015   Procedure: BILATERAL MASTECTOMY WITH LEFT SENTINEL LYMPH NODE BIOPSY;  Surgeon: Stark Klein, MD;  Location: [facility];  Service: General;  Laterality: Bilateral;   NASAL SEPTUM SURGERY     x 3   PLANTAR  FASCIA SURGERY Left    REMOVAL OF BILATERAL TISSUE EXPANDERS WITH PLACEMENT OF BILATERAL BREAST IMPLANTS Bilateral 06/17/2015   Procedure: DEBRIDEMENT OF BILATERAL MASTECTOMY FLAP WITH BILATERAL TISSUE EXPANDER EXCHANGE ;  Surgeon: Irene Limbo, MD;  Location: [facility];  Service: Plastics;  Laterality: Bilateral;   REMOVAL OF TISSUE EXPANDER Bilateral 07/12/2015   Procedure: REMOVAL OF BILATERAL TISSUE EXPANDERS;  Surgeon: Irene Limbo, MD;  Location: [facility];  Service: Plastics;  Laterality: Bilateral;   Patient Active Problem List   Diagnosis Date Noted   Severe protein-calorie malnutrition [place] [employer]: less than 60% of standard weight) ([place]) 07/30/2021   Generalized abdominal pain 07/30/2021   Acute pharyngeal candidiasis 07/30/2021   Insomnia 07/12/2021   Bipolar disorder, mixed 06/16/2021   Drug induced constipation 04/10/2021   EIC (epidermal inclusion cyst) 03/21/2021   Migraine headache 01/30/2021   Aortic atherosclerosis 01/30/2021   Bilateral carpal tunnel syndrome 01/30/2021   Chronic pain in right shoulder 12/25/2020   Ataxia 10/18/2020   Frequent falls 10/18/2020   Tremor 10/18/2020   Major depressive disorder 10/18/2020   Lumbar back pain 10/18/2020   Weight loss 08/28/2019   Simple chronic bronchitis 07/04/2019   Right lumbar radiculopathy 04/23/2019   Asthma 09/13/2017   COPD with acute exacerbation 07/03/2016   Hyperlipidemia 07/03/2016   Tobacco use    Sleep disorder 07/02/2016   Carotid artery disease 05/04/2016   Migraine aura without headache 02/08/2014    REFERRING PROVIDER: Alda Berthold, DO   REFERRING DIAG: Falls frequently  THERAPY DIAG:  No diagnosis found.  PERTINENT HISTORY: Frequent falls, history of left breast cancer s/p double mastectomy (2017), depression/anxiety, COPD with asthma, and migraine   PRECAUTIONS: Fall  SUBJECTIVE: Patient reports no falls since last visit. She has been doing well overall, staying busy with  work.  PAIN:  Are you having pain? No NPRS scale: N/A Pain location: N/A Pain orientation: N/A PAIN TYPE: N/A Pain description: N/A Aggravating factors: N/A Relieving factors: N/A  PATIENT GOALS: Improve balance, fall less   OBJECTIVE:  PATIENT SURVEYS:  FOTO 60% functional status (predicted 60%)   LE MMT:   MMT Right 04/04/2021 Left 04/04/2021 Rt / Lt 05/15/2021  Hip flexion 4- 4- 4- / 4-  Hip extension 3 3 3+ / 3+  Hip abduction 3 3 3+ / 3+  Knee flexion 4 4 4+ / 4+  Knee extension 4+ 4+ 4+ / 4+    FUNCTIONAL TESTS:  5 times sit to stand: 10 seconds Berg Balance Scale: 48 - 05/15/2021 TUG: 9 seconds   GAIT: Assistive device utilized: None Level of assistance: Complete Independence Comments: Narrow stance, occasionally with unsteady gait especially with turns      TODAY'S TREATMENT: 08/09/2021: Therapeutic Exercise: NuStep L5 x 5 min with UE/LE only while taking subjective SLR 2 x 10 each Bridge 2 x 10 Hooklying clamshell with green 2 x 10 Sidelying hip abduction 2 x 10 each LAQ 4# 2 x 10 each Standing hamstring curls with 4# 2 x 10 Heel raises with 4# 2 x 15 Sit to stand holding 9# at chest  3 x 10 Neuro Re-ed: 3/4 Tandem on Airex 2 x 30 sec each Tandem stance 3 x 30 sec each Rockerboard fwd/bwd taps and lateral taps 2 x 1 min each   07/03/2021: Therapeutic Exercise: NuStep L5 x 5 min with UE/LE only while taking subjective SLR 2 x 10 each Bridge 2 x 10 Hooklying clamshell with green 2 x 10 Sidelying hip abduction 2 x 10 each LAQ 4# 2 x 10 each Standing hamstring curls with 4# 2 x 10 Heel raises with 4# 2 x 15 Sit to stand holding 9# at chest 3 x 10 Neuro Re-ed: 3/4 Tandem on Airex 2 x 30 sec each Tandem stance 3 x 30 sec each Rockerboard fwd/bwd taps and lateral taps 2 x 1 min each  06/20/2021: Therapeutic Exercise: NuStep L5 x 5 min with LE only while taking subjective SLR 2 x 10 each Bridge 2 x 10 Hooklying clamshell with red 2 x  10 Sidelying hip abduction 2 x 10 each LAQ 3# 2 x 15 each Standing hamstring curls with 3# 2 x 15 Heel raises with 3# 2 x 15 Sit to stand holding 8# at chest 3 x 10 Forward step-up 6" box 2 x 10 each Neuro Re-ed: 3/4 Tandem on Airex 2 x 30 sec each Tandem stance 2 x 30 sec each Rockerboard fwd/bwd taps and lateral taps 2 x 1 min each   PATIENT EDUCATION:  Education details: HEP Person educated: Patient Education method: Consulting civil engineer, Media planner, Corporate treasurer cues, Verbal cues Education comprehension: verbalized understanding, returned demonstration, verbal cues required, tactile cues required, and needs further education   HOME EXERCISE PROGRAM: Access Code: [email or web address]     ASSESSMENT: CLINICAL IMPRESSION: Patient tolerated therapy well with no adverse effects. ***  Therapy continues to focus on balance training and strength progressions. Patient able to tolerate increased resistance and weight with exercises this visit. She does report difficulty and fatigue post therapy. She continues to require occasional UE support with balance challenges. No change to HEP this visit. Patient would benefit from continued skilled PT to progress strength and balance in order to improve gait and reduce fall risk.   Objective impairments include decreased balance and decreased strength.     GOALS: Goals reviewed with patient? Yes   SHORT TERM GOALS:   STG Name Target Date Goal status  1 Patient will be I with initial HEP in order to progress with therapy. Baseline: has not been consistent with exercises due to illness 05/15/2021: patient reports independence with initial HEP 04/25/2021 ACHIEVED    LONG TERM GOALS:    LTG Name Target Date Goal status  1 Patient will be I with final HEP to maintain progress from PT. Baseline: provided at eval 05/15/2021: progressing HEP 07/10/2021 ONGOING  2 Patient will demonstrate BERG balance scale >/= 51/56 to indicate improved balance and reduced fall  risk Baseline: 45/56 05/15/2021: 48/56 07/10/2021 PARTIALLY MET  3 Patient will demonstrate improve hip strength grossly >/= 4-/5 MMT in order to improve control and ability to correct balance Baseline: grossly 3/5 MMT 05/15/2021: grossly 3+/5 MMT 07/10/2021 PARTIALLY MET  4 Patient will report no limitation in activity level as a result of balance impairment or falls Baseline: patient currently with functional limitations as a result of balance impairment 05/15/2021: patient currently with functional limitations as a result of balance impairment 07/10/2021 ONGOING      PLAN: PT FREQUENCY: 1x/week   PT DURATION: 8 weeks   PLANNED INTERVENTIONS: Therapeutic exercises, Therapeutic activity, Neuro Muscular re-education, Balance  training, Gait training, Patient/Family education, Joint mobilization, Stair training, Aquatic Therapy, Dry Needling, and Manual therapy   PLAN FOR NEXT SESSION: Review HEP and progress PRN, continue LE strengthening and balance training    Hilda Blades, PT, DPT, LAT, ATC 08/09/21  1:40 PM Phone: [phone] Fax: [phone]

## 2021-08-10 ENCOUNTER — Ambulatory Visit: Payer: Medicare HMO | Attending: Neurology | Admitting: Physical Therapy

## 2021-08-10 DIAGNOSIS — F17200 Nicotine dependence, unspecified, uncomplicated: Secondary | ICD-10-CM | POA: Diagnosis not present

## 2021-08-10 DIAGNOSIS — Z681 Body mass index (BMI) 19 or less, adult: Secondary | ICD-10-CM | POA: Diagnosis not present

## 2021-08-10 DIAGNOSIS — J449 Chronic obstructive pulmonary disease, unspecified: Secondary | ICD-10-CM | POA: Diagnosis not present

## 2021-08-10 DIAGNOSIS — R413 Other amnesia: Secondary | ICD-10-CM | POA: Diagnosis not present

## 2021-08-10 DIAGNOSIS — Z853 Personal history of malignant neoplasm of breast: Secondary | ICD-10-CM | POA: Diagnosis not present

## 2021-08-14 ENCOUNTER — Ambulatory Visit (INDEPENDENT_AMBULATORY_CARE_PROVIDER_SITE_OTHER): Payer: Medicare HMO | Admitting: Family Medicine

## 2021-08-14 ENCOUNTER — Encounter: Payer: Self-pay | Admitting: Family Medicine

## 2021-08-14 VITALS — BP 72/40 | HR 60 | Temp 97.1°F | Resp 16 | Ht 61.0 in | Wt 73.0 lb

## 2021-08-14 DIAGNOSIS — F5105 Insomnia due to other mental disorder: Secondary | ICD-10-CM | POA: Diagnosis not present

## 2021-08-14 DIAGNOSIS — G8929 Other chronic pain: Secondary | ICD-10-CM

## 2021-08-14 DIAGNOSIS — F332 Major depressive disorder, recurrent severe without psychotic features: Secondary | ICD-10-CM

## 2021-08-14 DIAGNOSIS — R627 Adult failure to thrive: Secondary | ICD-10-CM | POA: Diagnosis not present

## 2021-08-14 DIAGNOSIS — M545 Low back pain, unspecified: Secondary | ICD-10-CM

## 2021-08-14 DIAGNOSIS — F99 Mental disorder, not otherwise specified: Secondary | ICD-10-CM

## 2021-08-14 DIAGNOSIS — E43 Unspecified severe protein-calorie malnutrition: Secondary | ICD-10-CM | POA: Diagnosis not present

## 2021-08-14 DIAGNOSIS — F419 Anxiety disorder, unspecified: Secondary | ICD-10-CM | POA: Diagnosis not present

## 2021-08-14 DIAGNOSIS — G43719 Chronic migraine without aura, intractable, without status migrainosus: Secondary | ICD-10-CM | POA: Diagnosis not present

## 2021-08-14 DIAGNOSIS — F330 Major depressive disorder, recurrent, mild: Secondary | ICD-10-CM | POA: Insufficient documentation

## 2021-08-14 DIAGNOSIS — I959 Hypotension, unspecified: Secondary | ICD-10-CM | POA: Diagnosis not present

## 2021-08-14 DIAGNOSIS — J410 Simple chronic bronchitis: Secondary | ICD-10-CM | POA: Diagnosis not present

## 2021-08-14 DIAGNOSIS — I951 Orthostatic hypotension: Secondary | ICD-10-CM | POA: Diagnosis not present

## 2021-08-14 DIAGNOSIS — R531 Weakness: Secondary | ICD-10-CM | POA: Diagnosis not present

## 2021-08-14 DIAGNOSIS — Z79899 Other long term (current) drug therapy: Secondary | ICD-10-CM

## 2021-08-14 DIAGNOSIS — J449 Chronic obstructive pulmonary disease, unspecified: Secondary | ICD-10-CM | POA: Diagnosis not present

## 2021-08-14 DIAGNOSIS — E7800 Pure hypercholesterolemia, unspecified: Secondary | ICD-10-CM | POA: Diagnosis not present

## 2021-08-14 DIAGNOSIS — E46 Unspecified protein-calorie malnutrition: Secondary | ICD-10-CM | POA: Diagnosis not present

## 2021-08-14 DIAGNOSIS — E860 Dehydration: Secondary | ICD-10-CM | POA: Diagnosis not present

## 2021-08-14 NOTE — Assessment & Plan Note (Addendum)
Sent to the emergency department for IV hydration. Recommend hold propranolol which is used for migraine prevention and tremor.

## 2021-08-14 NOTE — Assessment & Plan Note (Signed)
Recommend decrease propranolol due to hypotension.   Continue amitriptyline.  Patient is not taking [name] which we did not even get to explore today at her appointment.

## 2021-08-14 NOTE — Assessment & Plan Note (Signed)
I noticed after she went to the emergency department it says she is not taking her Trelegy.  Oddly enough her lungs sounded clear which normally she is wheezing.  Patient continues to smoke.  I definitely recommend she restarts her inhaler.

## 2021-08-14 NOTE — Assessment & Plan Note (Signed)
I have been trying to wean the patient off of several of the sedating medicines over the last 12 months. I would recommend decreasing gabapentin to 300 mg 3 times daily. I would recommend decreasing trazodone to 100 mg nightly.

## 2021-08-14 NOTE — Assessment & Plan Note (Signed)
Continues to worsen.  Patient has no motivation to eat. Recommend admission and then transition to skilled nursing facility for rehab.

## 2021-08-14 NOTE — Assessment & Plan Note (Signed)
If patient is unable to be admitted for hypotension and malnutrition, I would recommend an inpatient care facility for depression that would include rehab.

## 2021-08-14 NOTE — Assessment & Plan Note (Signed)
I have limited the patient to hydrocodone 5/325 mg once daily. Decrease gabapentin due to causing imbalance issues. Continue amitriptyline.

## 2021-08-14 NOTE — Assessment & Plan Note (Signed)
>>  ASSESSMENT AND PLAN FOR MILD RECURRENT MAJOR DEPRESSION ([facility]) WRITTEN ON 08/14/2021  5:39 PM BY COX, KIRSTEN, MD  If patient is unable to be admitted for hypotension and malnutrition, I would recommend an inpatient care facility for depression that would include rehab.

## 2021-08-14 NOTE — Assessment & Plan Note (Signed)
Recommend IV hydration.

## 2021-08-14 NOTE — Progress Notes (Addendum)
Subjective:  Patient ID: Anne [name], female    DOB: Nov 21, 1945  Age: 76 y.o. MRN: [id number]  Chief Complaint  Patient presents with   Weight Loss   HPI 76 year old white female with past medical history significant for severe calorie malnutrition, chronic lumbar back pain, chronic insomnia, severe major depression, polypharmacy, hyperlipidemia, chronic migraines, breast cancer successfully treated, and COPD who presents with her family.  On June 7 she was found by her family disoriented and confused and she was taken to [facility].  There she received IV hydration and was eventually discharged.  She presents for follow-up today.  Her blood pressure 72/40.  Patient reports that she is dizzy.  Patient is not eating.  She is not drinking.  She seems to have no motivation to live although she denies suicidality.  She has numerous medicines which I have tried to wean over the last year unsuccessfully.  Patient does not have a pillbox at home she takes medicines out of her pill containers.  She did stay with a family member for 1 to 2 days following her discharge from the emergency department and despite her saying she does not sleep and she was observed to sleep quite a bit taking trazodone 200 mg.  Patient is very weak.  Family is requesting rehab at a skilled nursing facility.  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  She is also had complaints of memory problems in the past however has had a negative work-up including MRI of her brain as well as neurology referral.  Both she and I have been very concerned about her weight loss. She has dropped from 103 lbs in 10/2020 down to 73 lbs today.  I have pursued a cancer work-up and despite her high risk due to her chronic smoking and her history of breast cancer, has been negative.  May 15th 2023: CT of abdomen and pelvis with contrast. Done at [facility].  [name] 29, 2023 CT of  the chest without contrast: Done at [facility].  At her visit 1 month ago I had been concerned she may have bipolar depression and I had put her on Seroquel 100 mg nightly.  I changed her from Abilify as she was having breakthrough severe depression.  At the emergency department in [facility] this was dropped to 50 mg nightly.  Patient has severe anhedonia.  Severe depression.  For insomnia in the past: Mirtazapine, temazepam, Belsomra,  Lunesta, Rozerem and now seroquel and trazodone.   Previous depression treatments:  Topamax (discontinued due to kidney stones) venlafaxine, Abilify, Xanax (in 2010), Cymbalta, hydroxyzine, Paxil.    Current Outpatient Medications on File Prior to Visit  Medication Sig Dispense Refill   albuterol (VENTOLIN HFA) 108 (90 Base) MCG/ACT inhaler INHALE 1 TO 2 PUFFS BY MOUTH EVERY 6 HOURS AS NEEDED FOR WHEEZING FOR SHORTNESS OF BREATH 18 g 0   amitriptyline (ELAVIL) 75 MG tablet TAKE 1 TABLET (75 MG TOTAL) BY MOUTH DAILY. 90 tablet 0   gabapentin (NEURONTIN) 300 MG capsule Take 2 capsules (600 mg total) by mouth 3 (three) times daily. 180 capsule 1   HYDROcodone-acetaminophen (NORCO/VICODIN) 5-325 MG tablet Take 1 tablet by mouth daily as needed for severe pain (migraines). 30 tablet 0   meloxicam (MOBIC) 15 MG tablet TAKE 1 TABLET EVERY DAY 90 tablet 1   mirtazapine (REMERON) 45 MG tablet TAKE 1 TABLET AT BEDTIME 90  tablet 1   omeprazole (PRILOSEC) 40 MG capsule TAKE 1 CAPSULE TWICE DAILY 180 capsule 1   primidone (MYSOLINE) 50 MG tablet TAKE 1 TABLET BY MOUTH IN THE MORNING AND TAKE 1 TABLET AT BEDTIME 180 tablet 0   rizatriptan (MAXALT-MLT) 10 MG disintegrating tablet DISSOLVE 1 TABLET ON THE TONGUE AT ONSET OF MIGRAINE, REPEAT IN 2 HOURS IF NEEDED 10 tablet 1   traZODone (DESYREL) 100 MG tablet TAKE 1 TABLET (100 MG TOTAL) BY MOUTH AT BEDTIME AS NEEDED FOR SLEEP. 90 tablet 1   cetirizine (ZYRTEC) 10 MG tablet Take 1 tablet (10 mg total) by mouth daily.  (Patient not taking: Reported on 08/14/2021) 30 tablet 11   Fluticasone-Umeclidin-Vilant (TRELEGY ELLIPTA) 200-62.5-25 MCG/ACT AEPB Inhale 1 puff into the lungs daily. (Patient not taking: Reported on 08/14/2021) 3 each 3   QUEtiapine (SEROQUEL) 100 MG tablet Take 1 tablet (100 mg total) by mouth at bedtime. (Patient taking differently: Take 50 mg by mouth at bedtime.) 90 tablet 0   No current facility-administered medications on file prior to visit.   Past Medical History:  Diagnosis Date   Acquired absence of both breasts and nipples 06/08/2015   Acute infection of nasal sinus 05/04/2021   Aortic atherosclerosis 01/30/2021   Asthma    daily inhalers   Ataxia 10/18/2020   Bilateral carpal tunnel syndrome 01/30/2021   Bipolar disorder, mixed 06/16/2021   Breast cancer of upper-inner quadrant of left female breast 04/15/2015   Carotid artery disease 05/04/2016   Mild, 1-39% bilaterally by doppler [name] 2018   Chronic pain in right shoulder 12/25/2020   COPD with acute exacerbation 07/03/2016   Dental crowns present    Drug induced constipation 04/10/2021   EIC (epidermal inclusion cyst) 03/21/2021   Family history of breast cancer in female 04/26/2015   Dx. Niece at 14; dx. Maternal aunt in her late 66s  Formatting of this note might be different from the original. Overview:  Dx. Niece at 51; dx. Maternal aunt in her late 63s   Family history of colon cancer 04/26/2015   Dx. In maternal grandfather in his late 45s-70  Formatting of this note might be different from the original. Overview:  Dx. In maternal grandfather in his late 67s-70   Frequent falls 10/18/2020   Genetic testing 06/02/2015   Negative for pathogenic mutations within any of 20 genes on the Breast/Ovarian Cancer Panel through [employer].  No variants of uncertain significance (VUSes) were found.  The Breast/Ovarian Cancer Panel offered by [facility] Hope Pigeon, MD) includes sequencing and  deletion/duplication analysis for the following 19 genes:  ATM, BARD1, BRCA1, BRCA2, BRIP1, CDH1, CHEK2, FANCC, M   History of basal cell carcinoma 04/26/2015   History of concussion 01/01/2014   History of kidney stones    History of TIA (transient ischemic attack) 10/2014   Hyperlipidemia    Insomnia    Lumbar back pain 10/18/2020   Major depressive disorder 10/18/2020   Malrotation of intestine 09/23/2017   Migraine aura without headache 02/08/2014   Migraine headache 01/30/2021   Nasal congestion 05/26/2015   will finish z-pak 05/27/2015   Nonproductive cough 05/26/2015   PONV (postoperative nausea and vomiting)    "long time ago"  none recently   Psychogenic syncope 04/23/2019   Right lumbar radiculopathy 04/23/2019   RLS (restless legs syndrome)    Simple chronic bronchitis 07/04/2019   Tobacco use    Tremor 10/18/2020   Vitamin D deficiency    Wears dentures  Weight loss 08/28/2019   Past Surgical History:  Procedure Laterality Date   ABDOMINAL HYSTERECTOMY     APPENDECTOMY  2000   BREAST BIOPSY Left    BREAST RECONSTRUCTION WITH PLACEMENT OF TISSUE EXPANDER AND FLEX HD (ACELLULAR HYDRATED DERMIS) Bilateral 06/02/2015   Procedure: BILATERAL BREAST RECONSTRUCTION WITH PLACEMENT OF TISSUE EXPANDER AND  ACELLULAR HYDRATED DERMIS;  Surgeon: Irene Limbo, MD;  Location: [facility];  Service: Plastics;  Laterality: Bilateral;   CARPAL TUNNEL RELEASE Bilateral    CATARACT EXTRACTION Bilateral 09/2013   KNEE ARTHROSCOPY Left    LAPAROSCOPIC ABDOMINAL EXPLORATION N/A 10/2017   LAPAROSCOPIC LYSIS OF CONGENITAL ADHESIVE BAND/LADD'S BANDS WIH INTRAOPERATIVE CHOLANGIOGRAPHY. Perforation of small intestion requiring repair.    LOOP RECORDER INSERTION N/A 05/11/2016   Procedure: Loop Recorder Insertion;  Surgeon: Will Meredith Leeds, MD;  Location: [facility];  Service: Cardiovascular;  Laterality: N/A;   MASTECTOMY W/ SENTINEL NODE BIOPSY Bilateral 06/02/2015    Procedure: BILATERAL MASTECTOMY WITH LEFT SENTINEL LYMPH NODE BIOPSY;  Surgeon: Stark Klein, MD;  Location: [facility];  Service: General;  Laterality: Bilateral;   NASAL SEPTUM SURGERY     x 3   PLANTAR FASCIA SURGERY Left    REMOVAL OF BILATERAL TISSUE EXPANDERS WITH PLACEMENT OF BILATERAL BREAST IMPLANTS Bilateral 06/17/2015   Procedure: DEBRIDEMENT OF BILATERAL MASTECTOMY FLAP WITH BILATERAL TISSUE EXPANDER EXCHANGE ;  Surgeon: Irene Limbo, MD;  Location: [facility];  Service: Plastics;  Laterality: Bilateral;   REMOVAL OF TISSUE EXPANDER Bilateral 07/12/2015   Procedure: REMOVAL OF BILATERAL TISSUE EXPANDERS;  Surgeon: Irene Limbo, MD;  Location: [facility];  Service: Plastics;  Laterality: Bilateral;    Family History  Problem Relation Age of Onset   Cervical cancer Mother 93   Colon cancer Maternal Grandfather        mets to stomach; dx. 67-70   Heart disease Father    Prostate cancer Father 49   Cervical cancer Maternal Grandmother        dx. 72s; treated with radium implant   Lung cancer Sister 41       maternal half-sister dx. lung cancer, stage III   Breast cancer Maternal Aunt    Cervical cancer Sister 56       paternal half-sister; s/p TAH   Spina bifida Grandchild    Brain cancer Grandchild        grandson dx. at 20 mos, treated at [facility] cancer Other 37       maternal half-sister's daughter   Cancer Maternal Aunt        d. 68; unspecified type - "began at back area and moved to vital organs"   Cancer Maternal Uncle         late 22s; unspecified type; "started at back and moved to vital organs"   Social History   Socioeconomic History   Marital status: Widowed    Spouse name: Not on file   Number of children: 1   Years of education: 77   Highest education level: Not on file  Occupational History    Comment: [employer], retired  Tobacco Use   Smoking status: Former    Packs/day: 0.25    Types: Cigarettes     Quit date: 03/21/2020    Years since quitting: 1.4   Smokeless tobacco: Never  Vaping Use   Vaping Use: Never used  Substance and Sexual Activity   Alcohol use: Yes    Alcohol/week:  0.0 standard drinks of alcohol    Comment: rarely   Drug use: No   Sexual activity: Not on file  Other Topics Concern   Not on file  Social History Narrative   Lives alone, widow   Caffeine use- tea, 1 cup daily   Right Handed    Lives in a one story [place] home    Social Determinants of Health   Financial Resource Strain: Low Risk  (02/17/2021)   Overall Financial Resource Strain (CARDIA)    Difficulty of Paying Living Expenses: Not hard at all  Food Insecurity: Not on file  Transportation Needs: No Transportation Needs (02/17/2021)   PRAPARE - Hydrologist (Medical): No    Lack of Transportation (Non-Medical): No  Physical Activity: Not on file  Stress: Not on file  Social Connections: Not on file    Review of Systems  Constitutional:  Positive for chills. Negative for fever.  HENT:  Positive for congestion. Negative for ear pain and sore throat.   Respiratory:  Positive for cough and shortness of breath.   Cardiovascular:  Negative for chest pain.  Gastrointestinal:  Positive for nausea. Negative for abdominal pain, constipation, diarrhea and vomiting.  Genitourinary:  Negative for dysuria.  Neurological:  Positive for seizures and headaches. Negative for tremors and light-headedness.  Psychiatric/Behavioral:  Positive for dysphoric mood. The patient is not nervous/anxious.      Objective:  BP (!) 72/40   Pulse 60   Temp (!) 97.1 F (36.2 C)   Resp 16   Ht 5' 1" (1.549 m)   Wt 73 lb (33.1 kg)   BMI 13.79 kg/m      08/14/2021    2:55 PM 07/11/2021    3:24 PM 06/01/2021    3:48 PM  BP/Weight  Systolic BP 72 998 338  Diastolic BP 40 78 50  Wt. (Lbs) 73 81 86.8  BMI 13.79 kg/m2 15.3 kg/m2 16.4 kg/m2    Physical Exam Vitals reviewed.   Constitutional:      Comments: Frail, thin.   Neck:     Vascular: No carotid bruit.  Cardiovascular:     Rate and Rhythm: Normal rate and regular rhythm.     Heart sounds: Normal heart sounds.  Pulmonary:     Effort: Pulmonary effort is normal.     Breath sounds: Normal breath sounds.  Abdominal:     General: Bowel sounds are normal.     Tenderness: There is no abdominal tenderness.     Comments: Concave appearance.   Neurological:     General: No focal deficit present.     Mental Status: She is alert.     Motor: Weakness, atrophy and abnormal muscle tone present.     Gait: Gait is intact.     Comments: 4/5 UE and LE BL.  Patient was able to get up from chair without using hands with much effort.   Psychiatric:     Comments: Depressed.    Orthostatics: Supine: Blood pressure 120/80, pulse 72. Sitting: Blood pressure 92/60,  pulse 70 Standing: Blood pressure 78/60, pulse 74  Pediatric blood pressure cuff was used.    Diabetic Foot Exam - Simple   No data filed      Lab Results  Component Value Date   WBC 8.1 07/11/2021   HGB 10.8 (L) 07/11/2021   HCT 33.8 (L) 07/11/2021   PLT 258 07/11/2021   GLUCOSE 71 07/11/2021   CHOL 132 01/30/2021   TRIG  133 01/30/2021   HDL 64 01/30/2021   LDLCALC 45 01/30/2021   ALT 14 07/11/2021   AST 22 07/11/2021   NA 140 07/11/2021   K 3.6 07/11/2021   CL 103 07/11/2021   CREATININE 0.57 07/11/2021   BUN 13 07/11/2021   CO2 24 07/11/2021   TSH 1.220 07/11/2021      Assessment & Plan:   Problem List Items Addressed This Visit       Cardiovascular and Mediastinum   Migraine headache    Recommend decrease propranolol due to hypotension.   Continue amitriptyline.  Patient is not taking [name] which we did not even get to explore today at her appointment.      Orthostatic hypotension    Sent to the emergency department for IV hydration. Recommend hold propranolol which is used for migraine prevention and tremor.         Respiratory   Simple chronic bronchitis    I noticed after she went to the emergency department it says she is not taking her Trelegy.  Oddly enough her lungs sounded clear which normally she is wheezing.  Patient continues to smoke.  I definitely recommend she restarts her inhaler.        Other   Chronic midline low back pain without sciatica    I have limited the patient to hydrocodone 5/325 mg once daily. Decrease gabapentin due to causing imbalance issues. Continue amitriptyline.      Insomnia    Recommend decrease trazodone to 100 mg nightly.      Severe protein-calorie malnutrition Altamease Oiler: less than 60% of standard weight) ([place]) - Primary    Continues to worsen.  Patient has no motivation to eat. Recommend admission and then transition to skilled nursing facility for rehab.      Dehydration    Recommend IV hydration.      Polypharmacy    I have been trying to wean the patient off of several of the sedating medicines over the last 12 months. I would recommend decreasing gabapentin to 300 mg 3 times daily. I would recommend decreasing trazodone to 100 mg nightly.       Severe episode of recurrent major depressive disorder, without psychotic features ([place])    If patient is unable to be admitted for hypotension and malnutrition, I would recommend an inpatient care facility for depression that would include rehab.     . Total time spent on today's visit was greater than 60 minutes, including both face-to-face time and nonface-to-face time personally spent on review of chart (labs and imaging), discussing labs and goals, discussing further work-up, treatment options, referrals to specialist if needed, reviewing outside records of pertinent, answering patient's questions, and coordinating care.   Follow-up: No follow-ups on file.  An After Visit Summary was printed and given to the patient.  [name], MD [facility] [phone]

## 2021-08-14 NOTE — Patient Instructions (Addendum)
Decrease gabapentin to 300 mg 3 times daily. Stop propranolol. Decrease trazodone to 100 mg ONLY AT NIGHT.  Stop Hydrocodone/apap

## 2021-08-14 NOTE — Telephone Encounter (Signed)
Spoke with [name]. Marlen stated that he would get in touch with his mom and have her to call our office.

## 2021-08-14 NOTE — Assessment & Plan Note (Signed)
Recommend decrease trazodone to 100 mg nightly.

## 2021-08-15 DIAGNOSIS — E46 Unspecified protein-calorie malnutrition: Secondary | ICD-10-CM | POA: Diagnosis not present

## 2021-08-15 DIAGNOSIS — J449 Chronic obstructive pulmonary disease, unspecified: Secondary | ICD-10-CM | POA: Diagnosis not present

## 2021-08-15 DIAGNOSIS — R627 Adult failure to thrive: Secondary | ICD-10-CM | POA: Diagnosis not present

## 2021-08-15 NOTE — Telephone Encounter (Signed)
Spoke with [name]. Anne [name] stated that Anne [name] was admitted yesterday in the hospital  for dehydration. Anne [name] stated that they will call our office to schedule and appointment with Dr. Lyndel Safe once pt has been discharged.  Anne [name]  verbalized understanding with all questions answered.

## 2021-08-16 ENCOUNTER — Other Ambulatory Visit: Payer: Self-pay | Admitting: Family Medicine

## 2021-08-22 ENCOUNTER — Telehealth: Payer: Self-pay

## 2021-08-22 DIAGNOSIS — E43 Unspecified severe protein-calorie malnutrition: Secondary | ICD-10-CM | POA: Diagnosis not present

## 2021-08-22 DIAGNOSIS — J9622 Acute and chronic respiratory failure with hypercapnia: Secondary | ICD-10-CM | POA: Diagnosis not present

## 2021-08-22 DIAGNOSIS — Z7951 Long term (current) use of inhaled steroids: Secondary | ICD-10-CM

## 2021-08-22 DIAGNOSIS — G2581 Restless legs syndrome: Secondary | ICD-10-CM | POA: Diagnosis not present

## 2021-08-22 DIAGNOSIS — Z681 Body mass index (BMI) 19 or less, adult: Secondary | ICD-10-CM | POA: Diagnosis not present

## 2021-08-22 DIAGNOSIS — F17210 Nicotine dependence, cigarettes, uncomplicated: Secondary | ICD-10-CM | POA: Diagnosis not present

## 2021-08-22 DIAGNOSIS — F324 Major depressive disorder, single episode, in partial remission: Secondary | ICD-10-CM | POA: Diagnosis not present

## 2021-08-22 DIAGNOSIS — J189 Pneumonia, unspecified organism: Secondary | ICD-10-CM | POA: Diagnosis not present

## 2021-08-22 DIAGNOSIS — F419 Anxiety disorder, unspecified: Secondary | ICD-10-CM | POA: Diagnosis not present

## 2021-08-22 DIAGNOSIS — R296 Repeated falls: Secondary | ICD-10-CM | POA: Diagnosis not present

## 2021-08-22 DIAGNOSIS — G8929 Other chronic pain: Secondary | ICD-10-CM

## 2021-08-22 DIAGNOSIS — R634 Abnormal weight loss: Secondary | ICD-10-CM | POA: Diagnosis not present

## 2021-08-22 DIAGNOSIS — R079 Chest pain, unspecified: Secondary | ICD-10-CM | POA: Diagnosis not present

## 2021-08-22 DIAGNOSIS — J449 Chronic obstructive pulmonary disease, unspecified: Secondary | ICD-10-CM | POA: Diagnosis not present

## 2021-08-22 DIAGNOSIS — R918 Other nonspecific abnormal finding of lung field: Secondary | ICD-10-CM | POA: Diagnosis not present

## 2021-08-22 DIAGNOSIS — E860 Dehydration: Secondary | ICD-10-CM | POA: Diagnosis not present

## 2021-08-22 DIAGNOSIS — G43809 Other migraine, not intractable, without status migrainosus: Secondary | ICD-10-CM | POA: Diagnosis not present

## 2021-08-22 DIAGNOSIS — R531 Weakness: Secondary | ICD-10-CM | POA: Diagnosis not present

## 2021-08-22 NOTE — Telephone Encounter (Signed)
Anne [name] had called with concerns about possible placement to [facility].  She is questioning taking Anne [name] to the ED for assistance in getting her placed, however she reports that Anne [name] is doing some better. I explained that if she is not admitted then they would have to do the leg work in finding availability and we can complete the FL2  Forms.  She is going to call us back when they check with [facility].

## 2021-08-22 NOTE — Telephone Encounter (Signed)
Anne [name] came into the office wanting to f.up on the call with the on-call provider last night and the VM she left today. Anne [name] stated that Anne [name] needs to be placed somewhere today. She is wanting [facility] or the hospital. Anne [name] was notified that the on-call provider spoke with Dr. Tobie Poet this morning and the VM message was sent to Dr. Tobie Poet this morning. Anne [name] was notified that Dr. Tobie Poet had to step out of the office and she will be back this afternoon. I told her I will reiterate the message to Dr. Tobie Poet and someone will call her back.Anne [name] stated Anne [name] is going somewhere today.

## 2021-08-22 NOTE — Telephone Encounter (Signed)
Patient's daughter Almyra Free called and stated that Anne [name] is now willing to get help with Inpatient rehab at [place] and or Rehab services in the Hospital by going through the ER. Patient's daughter is calling asking your opinion which would be the quickest or which would you prefer to recommend. Either go through the ER to get placed some where, or should she get paperwork and bring here? Please advise.

## 2021-08-23 DIAGNOSIS — E43 Unspecified severe protein-calorie malnutrition: Secondary | ICD-10-CM | POA: Diagnosis not present

## 2021-08-23 DIAGNOSIS — J9622 Acute and chronic respiratory failure with hypercapnia: Secondary | ICD-10-CM | POA: Diagnosis not present

## 2021-08-23 DIAGNOSIS — J189 Pneumonia, unspecified organism: Secondary | ICD-10-CM | POA: Diagnosis not present

## 2021-08-24 DIAGNOSIS — M4802 Spinal stenosis, cervical region: Secondary | ICD-10-CM | POA: Diagnosis not present

## 2021-08-24 DIAGNOSIS — R918 Other nonspecific abnormal finding of lung field: Secondary | ICD-10-CM | POA: Diagnosis not present

## 2021-08-24 DIAGNOSIS — R0602 Shortness of breath: Secondary | ICD-10-CM | POA: Diagnosis not present

## 2021-08-24 DIAGNOSIS — J90 Pleural effusion, not elsewhere classified: Secondary | ICD-10-CM | POA: Diagnosis not present

## 2021-08-24 DIAGNOSIS — J690 Pneumonitis due to inhalation of food and vomit: Secondary | ICD-10-CM | POA: Diagnosis not present

## 2021-08-24 DIAGNOSIS — R0902 Hypoxemia: Secondary | ICD-10-CM | POA: Diagnosis not present

## 2021-08-24 DIAGNOSIS — B9620 Unspecified Escherichia coli [E. coli] as the cause of diseases classified elsewhere: Secondary | ICD-10-CM | POA: Diagnosis not present

## 2021-08-24 DIAGNOSIS — J9621 Acute and chronic respiratory failure with hypoxia: Secondary | ICD-10-CM | POA: Diagnosis not present

## 2021-08-24 DIAGNOSIS — R296 Repeated falls: Secondary | ICD-10-CM | POA: Diagnosis not present

## 2021-08-24 DIAGNOSIS — Z681 Body mass index (BMI) 19 or less, adult: Secondary | ICD-10-CM | POA: Diagnosis not present

## 2021-08-24 DIAGNOSIS — J440 Chronic obstructive pulmonary disease with acute lower respiratory infection: Secondary | ICD-10-CM | POA: Diagnosis not present

## 2021-08-24 DIAGNOSIS — N390 Urinary tract infection, site not specified: Secondary | ICD-10-CM | POA: Diagnosis not present

## 2021-08-24 DIAGNOSIS — J9622 Acute and chronic respiratory failure with hypercapnia: Secondary | ICD-10-CM | POA: Diagnosis not present

## 2021-08-24 DIAGNOSIS — R531 Weakness: Secondary | ICD-10-CM | POA: Diagnosis not present

## 2021-08-24 DIAGNOSIS — R059 Cough, unspecified: Secondary | ICD-10-CM | POA: Diagnosis not present

## 2021-08-24 DIAGNOSIS — E43 Unspecified severe protein-calorie malnutrition: Secondary | ICD-10-CM | POA: Diagnosis not present

## 2021-08-24 DIAGNOSIS — J189 Pneumonia, unspecified organism: Secondary | ICD-10-CM | POA: Diagnosis not present

## 2021-08-24 DIAGNOSIS — M5023 Other cervical disc displacement, cervicothoracic region: Secondary | ICD-10-CM | POA: Diagnosis not present

## 2021-08-24 DIAGNOSIS — R079 Chest pain, unspecified: Secondary | ICD-10-CM | POA: Diagnosis not present

## 2021-08-24 DIAGNOSIS — J449 Chronic obstructive pulmonary disease, unspecified: Secondary | ICD-10-CM | POA: Diagnosis not present

## 2021-08-24 DIAGNOSIS — Z743 Need for continuous supervision: Secondary | ICD-10-CM | POA: Diagnosis not present

## 2021-08-24 DIAGNOSIS — M47812 Spondylosis without myelopathy or radiculopathy, cervical region: Secondary | ICD-10-CM | POA: Diagnosis not present

## 2021-08-24 DIAGNOSIS — J811 Chronic pulmonary edema: Secondary | ICD-10-CM | POA: Diagnosis not present

## 2021-08-25 ENCOUNTER — Inpatient Hospital Stay: Payer: Medicare HMO | Admitting: Family Medicine

## 2021-08-27 ENCOUNTER — Other Ambulatory Visit: Payer: Self-pay | Admitting: Family Medicine

## 2021-09-04 ENCOUNTER — Encounter: Payer: Self-pay | Admitting: Family Medicine

## 2021-09-05 DIAGNOSIS — F331 Major depressive disorder, recurrent, moderate: Secondary | ICD-10-CM | POA: Diagnosis not present

## 2021-09-05 DIAGNOSIS — E43 Unspecified severe protein-calorie malnutrition: Secondary | ICD-10-CM | POA: Diagnosis not present

## 2021-09-05 DIAGNOSIS — R630 Anorexia: Secondary | ICD-10-CM | POA: Diagnosis not present

## 2021-09-05 DIAGNOSIS — F324 Major depressive disorder, single episode, in partial remission: Secondary | ICD-10-CM | POA: Diagnosis not present

## 2021-09-05 DIAGNOSIS — R627 Adult failure to thrive: Secondary | ICD-10-CM | POA: Diagnosis not present

## 2021-09-05 DIAGNOSIS — Z79899 Other long term (current) drug therapy: Secondary | ICD-10-CM | POA: Diagnosis not present

## 2021-09-05 DIAGNOSIS — J9622 Acute and chronic respiratory failure with hypercapnia: Secondary | ICD-10-CM | POA: Diagnosis not present

## 2021-09-05 DIAGNOSIS — F5101 Primary insomnia: Secondary | ICD-10-CM | POA: Diagnosis not present

## 2021-09-05 DIAGNOSIS — J9621 Acute and chronic respiratory failure with hypoxia: Secondary | ICD-10-CM | POA: Diagnosis not present

## 2021-09-05 DIAGNOSIS — I959 Hypotension, unspecified: Secondary | ICD-10-CM | POA: Diagnosis not present

## 2021-09-05 DIAGNOSIS — J189 Pneumonia, unspecified organism: Secondary | ICD-10-CM | POA: Diagnosis not present

## 2021-09-05 DIAGNOSIS — Z743 Need for continuous supervision: Secondary | ICD-10-CM | POA: Diagnosis not present

## 2021-09-05 DIAGNOSIS — J449 Chronic obstructive pulmonary disease, unspecified: Secondary | ICD-10-CM | POA: Diagnosis not present

## 2021-09-05 DIAGNOSIS — R0902 Hypoxemia: Secondary | ICD-10-CM | POA: Diagnosis not present

## 2021-09-06 DIAGNOSIS — Z79899 Other long term (current) drug therapy: Secondary | ICD-10-CM | POA: Diagnosis not present

## 2021-09-07 ENCOUNTER — Telehealth: Payer: Self-pay

## 2021-09-07 DIAGNOSIS — F324 Major depressive disorder, single episode, in partial remission: Secondary | ICD-10-CM | POA: Diagnosis not present

## 2021-09-07 DIAGNOSIS — R627 Adult failure to thrive: Secondary | ICD-10-CM | POA: Diagnosis not present

## 2021-09-07 DIAGNOSIS — R630 Anorexia: Secondary | ICD-10-CM | POA: Diagnosis not present

## 2021-09-07 DIAGNOSIS — J9621 Acute and chronic respiratory failure with hypoxia: Secondary | ICD-10-CM | POA: Diagnosis not present

## 2021-09-07 NOTE — Telephone Encounter (Signed)
Patient called to let us know that she was discharged from hospital and admitted to [facility]. She also mentioned that her family is going to beach. If you need to call her, please call her cell phone. She is the room 203. Please advice.

## 2021-09-16 ENCOUNTER — Other Ambulatory Visit: Payer: Self-pay | Admitting: Family Medicine

## 2021-09-18 NOTE — Telephone Encounter (Signed)
Spoke with Anne [name], she is being discharged from [facility] today.   Also Mirna Mires has a schedule conflict with patient's appointment on Wednesday. He is unable to bring patient until after 1:30 p any day.   Will speak with Dr Tobie Poet about appointment change.  Royce Macadamia, [place] 09/18/21 10:09 AM

## 2021-09-20 ENCOUNTER — Telehealth: Payer: Self-pay | Admitting: Family Medicine

## 2021-09-20 ENCOUNTER — Telehealth: Payer: Self-pay

## 2021-09-20 ENCOUNTER — Other Ambulatory Visit: Payer: Self-pay

## 2021-09-20 ENCOUNTER — Ambulatory Visit (INDEPENDENT_AMBULATORY_CARE_PROVIDER_SITE_OTHER): Payer: Medicare HMO | Admitting: Family Medicine

## 2021-09-20 ENCOUNTER — Encounter: Payer: Self-pay | Admitting: Family Medicine

## 2021-09-20 VITALS — BP 100/58 | HR 77 | Temp 99.4°F | Ht 61.0 in | Wt 82.0 lb

## 2021-09-20 DIAGNOSIS — E871 Hypo-osmolality and hyponatremia: Secondary | ICD-10-CM | POA: Diagnosis not present

## 2021-09-20 DIAGNOSIS — E782 Mixed hyperlipidemia: Secondary | ICD-10-CM

## 2021-09-20 DIAGNOSIS — J690 Pneumonitis due to inhalation of food and vomit: Secondary | ICD-10-CM

## 2021-09-20 DIAGNOSIS — E43 Unspecified severe protein-calorie malnutrition: Secondary | ICD-10-CM

## 2021-09-20 DIAGNOSIS — J410 Simple chronic bronchitis: Secondary | ICD-10-CM

## 2021-09-20 DIAGNOSIS — F17200 Nicotine dependence, unspecified, uncomplicated: Secondary | ICD-10-CM

## 2021-09-20 DIAGNOSIS — I700 Atherosclerosis of aorta: Secondary | ICD-10-CM | POA: Diagnosis not present

## 2021-09-20 DIAGNOSIS — J9601 Acute respiratory failure with hypoxia: Secondary | ICD-10-CM

## 2021-09-20 MED ORDER — SERTRALINE HCL 50 MG PO TABS: 50.0000 mg | ORAL_TABLET | Freq: Every day | ORAL | 1 refills | Status: DC

## 2021-09-20 MED ORDER — RIZATRIPTAN BENZOATE 10 MG PO TBDP: ORAL_TABLET | ORAL | 2 refills | Status: DC

## 2021-09-20 MED ORDER — TRELEGY ELLIPTA 200-62.5-25 MCG/ACT IN AEPB: 1.0000 | INHALATION_SPRAY | Freq: Every day | RESPIRATORY_TRACT | 3 refills | Status: DC

## 2021-09-20 MED ORDER — MIRTAZAPINE 45 MG PO TABS: 45.0000 mg | ORAL_TABLET | Freq: Every day | ORAL | 1 refills | Status: DC

## 2021-09-20 MED ORDER — GABAPENTIN 300 MG PO CAPS: 600.0000 mg | ORAL_CAPSULE | Freq: Three times a day (TID) | ORAL | 2 refills | Status: DC

## 2021-09-20 MED ORDER — NICOTINE 21 MG/24HR TD PT24: 21.0000 mg | MEDICATED_PATCH | Freq: Every day | TRANSDERMAL | 1 refills | Status: DC

## 2021-09-20 MED ORDER — ALBUTEROL SULFATE HFA 108 (90 BASE) MCG/ACT IN AERS
INHALATION_SPRAY | RESPIRATORY_TRACT | 0 refills | Status: DC
Start: 2021-09-20 — End: 2022-01-07

## 2021-09-20 MED ORDER — QUETIAPINE FUMARATE 100 MG PO TABS: 50.0000 mg | ORAL_TABLET | Freq: Every day | ORAL | 2 refills | Status: DC

## 2021-09-20 MED ORDER — CETIRIZINE HCL 10 MG PO TABS: 10.0000 mg | ORAL_TABLET | Freq: Every day | ORAL | 11 refills | Status: DC

## 2021-09-20 MED ORDER — OMEPRAZOLE 40 MG PO CPDR: 40.0000 mg | DELAYED_RELEASE_CAPSULE | Freq: Two times a day (BID) | ORAL | 1 refills | Status: DC

## 2021-09-20 MED ORDER — TRAZODONE HCL 100 MG PO TABS: 100.0000 mg | ORAL_TABLET | Freq: Every evening | ORAL | 1 refills | Status: DC | PRN

## 2021-09-20 MED ORDER — TAMOXIFEN CITRATE 20 MG PO TABS: 20.0000 mg | ORAL_TABLET | Freq: Every day | ORAL | 2 refills | Status: DC

## 2021-09-20 MED ORDER — MEGESTROL ACETATE 20 MG PO TABS: 40.0000 mg | ORAL_TABLET | Freq: Every day | ORAL | 2 refills | Status: DC

## 2021-09-20 MED ORDER — MELOXICAM 15 MG PO TABS: 15.0000 mg | ORAL_TABLET | Freq: Every day | ORAL | 1 refills | Status: DC

## 2021-09-20 NOTE — Telephone Encounter (Signed)
Patient called back.  Her orphenadrine is over $400.  She cannot afford this.  We will call the patient back and let her know not to have [employer] send this.  She was actually not on this when she was recently in [facility].

## 2021-09-20 NOTE — Telephone Encounter (Signed)
Will re-onboard to [facility] and make sure pill packs are delivered ASAP per direct msg from PCP. Coordinated with team

## 2021-09-20 NOTE — Progress Notes (Signed)
Subjective:  Patient ID: Anne [name], female    DOB: April 30, 1945  Age: 76 y.o. MRN: [id number]  Chief Complaint  Patient presents with   Discharge from [facility]   COPD    HPI Patient was discharge from [facility] on 09/18/2021 after to be admitted in [facility] since 08/24/2021 to 09/05/2021 due to Acute respiratory failure, failure to thrive adult, frequent falls, generalized weakness and aspiration pneumonia.They did CT chest without any evidence of malignancy. Patient was discharged from Hospital with new medications: Gabapentin 300 mg TID, Sertraline 25 mg daily, amitriptyline 25 mg PO HS, Florinef 0.1 mg daily, Propranolol 60 mg BID, Lortab 5/325 mg 1 tablet daily, Nicotine 21 mg daily, Trazodone 50 mg QHS, Quetiapine 25 mg QHS.  She was discharged from [facility] stable, no need DME. Patient needed training on gain/ambulation, gain strength, community resources, menus of meals and home safety.  Patient is stable at home and she is taking new medication as directed. Patient requesting I put her back to work and allow her to drive 2 days after recent admission.   Patient is not smoking, but has been very tempted. She had a nicoderm patch at [facility].   Past Medical History:  Diagnosis Date   Acquired absence of both breasts and nipples 06/08/2015   Acute infection of nasal sinus 05/04/2021   Aortic atherosclerosis 01/30/2021   Asthma    daily inhalers   Ataxia 10/18/2020   Bilateral carpal tunnel syndrome 01/30/2021   Bipolar disorder, mixed 06/16/2021   Breast cancer of upper-inner quadrant of left female breast 04/15/2015   Carotid artery disease 05/04/2016   Mild, 1-39% bilaterally by doppler [name] 2018   Chronic pain in right shoulder 12/25/2020   COPD with acute exacerbation 07/03/2016   Dental crowns present    Drug induced constipation 04/10/2021   EIC (epidermal inclusion cyst) 03/21/2021   Family history of breast cancer in female 04/26/2015   Dx. Niece at 61; dx.  Maternal aunt in her late 16s  Formatting of this note might be different from the original. Overview:  Dx. Niece at 76; dx. Maternal aunt in her late 57s   Family history of colon cancer 04/26/2015   Dx. In maternal grandfather in his late 23s-70  Formatting of this note might be different from the original. Overview:  Dx. In maternal grandfather in his late 74s-70   Frequent falls 10/18/2020   Genetic testing 06/02/2015   Negative for pathogenic mutations within any of 20 genes on the Breast/Ovarian Cancer Panel through [employer].  No variants of uncertain significance (VUSes) were found.  The Breast/Ovarian Cancer Panel offered by [facility] Hope Pigeon, MD) includes sequencing and deletion/duplication analysis for the following 19 genes:  ATM, BARD1, BRCA1, BRCA2, BRIP1, CDH1, CHEK2, FANCC, M   History of basal cell carcinoma 04/26/2015   History of concussion 01/01/2014   History of kidney stones    History of TIA (transient ischemic attack) 10/2014   Hyperlipidemia    Insomnia    Lumbar back pain 10/18/2020   Major depressive disorder 10/18/2020   Malrotation of intestine 09/23/2017   Migraine aura without headache 02/08/2014   Migraine headache 01/30/2021   Nasal congestion 05/26/2015   will finish z-pak 05/27/2015   Nonproductive cough 05/26/2015   PONV (postoperative nausea and vomiting)    "long time ago"  none recently   Psychogenic syncope 04/23/2019   Right lumbar radiculopathy 04/23/2019   RLS (restless legs syndrome)    Simple chronic bronchitis  07/04/2019   Tobacco use    Tremor 10/18/2020   Vitamin D deficiency    Wears dentures    Weight loss 08/28/2019   Past Surgical History:  Procedure Laterality Date   ABDOMINAL HYSTERECTOMY     APPENDECTOMY  2000   BREAST BIOPSY Left    BREAST RECONSTRUCTION WITH PLACEMENT OF TISSUE EXPANDER AND FLEX HD (ACELLULAR HYDRATED DERMIS) Bilateral 06/02/2015   Procedure: BILATERAL BREAST RECONSTRUCTION WITH  PLACEMENT OF TISSUE EXPANDER AND  ACELLULAR HYDRATED DERMIS;  Surgeon: Irene Limbo, MD;  Location: [facility];  Service: Plastics;  Laterality: Bilateral;   CARPAL TUNNEL RELEASE Bilateral    CATARACT EXTRACTION Bilateral 09/2013   KNEE ARTHROSCOPY Left    LAPAROSCOPIC ABDOMINAL EXPLORATION N/A 10/2017   LAPAROSCOPIC LYSIS OF CONGENITAL ADHESIVE BAND/LADD'S BANDS WIH INTRAOPERATIVE CHOLANGIOGRAPHY. Perforation of small intestion requiring repair.    LOOP RECORDER INSERTION N/A 05/11/2016   Procedure: Loop Recorder Insertion;  Surgeon: Will Meredith Leeds, MD;  Location: [facility];  Service: Cardiovascular;  Laterality: N/A;   MASTECTOMY W/ SENTINEL NODE BIOPSY Bilateral 06/02/2015   Procedure: BILATERAL MASTECTOMY WITH LEFT SENTINEL LYMPH NODE BIOPSY;  Surgeon: Stark Klein, MD;  Location: [facility];  Service: General;  Laterality: Bilateral;   NASAL SEPTUM SURGERY     x 3   PLANTAR FASCIA SURGERY Left    REMOVAL OF BILATERAL TISSUE EXPANDERS WITH PLACEMENT OF BILATERAL BREAST IMPLANTS Bilateral 06/17/2015   Procedure: DEBRIDEMENT OF BILATERAL MASTECTOMY FLAP WITH BILATERAL TISSUE EXPANDER EXCHANGE ;  Surgeon: Irene Limbo, MD;  Location: [facility];  Service: Plastics;  Laterality: Bilateral;   REMOVAL OF TISSUE EXPANDER Bilateral 07/12/2015   Procedure: REMOVAL OF BILATERAL TISSUE EXPANDERS;  Surgeon: Irene Limbo, MD;  Location: [facility];  Service: Plastics;  Laterality: Bilateral;    Family History  Problem Relation Age of Onset   Cervical cancer Mother 68   Colon cancer Maternal Grandfather        mets to stomach; dx. 67-70   Heart disease Father    Prostate cancer Father 86   Cervical cancer Maternal Grandmother        dx. 89s; treated with radium implant   Lung cancer Sister 11       maternal half-sister dx. lung cancer, stage III   Breast cancer Maternal Aunt    Cervical cancer Sister 15       paternal half-sister; s/p  TAH   Spina bifida Grandchild    Brain cancer Grandchild        grandson dx. at 20 mos, treated at [facility] Other 47       maternal half-sister's daughter   Cancer Maternal Aunt        d. 5; unspecified type - "began at back area and moved to vital organs"   Cancer Maternal Uncle         late 28s; unspecified type; "started at back and moved to vital organs"   Social History   Socioeconomic History   Marital status: Widowed    Spouse name: Not on file   Number of children: 1   Years of education: 48   Highest education level: Not on file  Occupational History    Comment: [employer], retired  Tobacco Use   Smoking status: Former    Packs/day: 0.15    Types: Cigarettes    Quit date: 03/21/2020    Years since quitting: 1.5   Smokeless tobacco: Never  Vaping Use   Vaping Use: Never used  Substance and Sexual Activity   Alcohol use: Yes    Alcohol/week: 0.0 standard drinks of alcohol    Comment: rarely   Drug use: No   Sexual activity: Not on file  Other Topics Concern   Not on file  Social History Narrative   Lives alone, widow   Caffeine use- tea, 1 cup daily   Right Handed    Lives in a one story [place] home    Social Determinants of Health   Financial Resource Strain: Low Risk  (02/17/2021)   Overall Financial Resource Strain (CARDIA)    Difficulty of Paying Living Expenses: Not hard at all  Food Insecurity: Not on file  Transportation Needs: No Transportation Needs (02/17/2021)   PRAPARE - Hydrologist (Medical): No    Lack of Transportation (Non-Medical): No  Physical Activity: Not on file  Stress: Not on file  Social Connections: Not on file    Review of Systems  Constitutional:  Positive for fatigue and unexpected weight change. Negative for appetite change and fever.  HENT:  Negative for congestion, ear pain, sinus pressure and sore throat.   Respiratory:  Negative for cough, shortness of breath and  wheezing.   Cardiovascular:  Negative for chest pain and palpitations.  Gastrointestinal:  Negative for abdominal pain, constipation, diarrhea, nausea and vomiting.  Genitourinary:  Negative for dysuria and frequency.  Musculoskeletal:  Negative for arthralgias, back pain, joint swelling and myalgias.  Skin:  Negative for rash.  Neurological:  Negative for dizziness, weakness and headaches.  Psychiatric/Behavioral:  Negative for dysphoric mood. The patient is not nervous/anxious.      Objective:  BP (!) 100/58   Pulse 77   Temp 99.4 F (37.4 C)   Ht 5' 1"  (1.549 m)   Wt 82 lb (37.2 kg)   SpO2 99%   BMI 15.49 kg/m      09/20/2021    2:52 PM 09/20/2021    2:02 PM 08/14/2021    2:55 PM  BP/Weight  Systolic BP 496 80 72  Diastolic BP 58 50 40  Wt. (Lbs)  82 73  BMI  15.49 kg/m2 13.79 kg/m2    Physical Exam Vitals reviewed.  Constitutional:      Comments: Thin but improving.   Neck:     Vascular: No carotid bruit.  Cardiovascular:     Rate and Rhythm: Normal rate and regular rhythm.     Heart sounds: Normal heart sounds.  Pulmonary:     Effort: Pulmonary effort is normal.     Breath sounds: Normal breath sounds.  Abdominal:     General: Bowel sounds are normal.     Palpations: Abdomen is soft.     Tenderness: There is abdominal tenderness (mild diffuse).  Musculoskeletal:     Comments: Strength UE and LE 5/5 BL.   Neurological:     Mental Status: She is alert and oriented to person, place, and time. Mental status is at baseline.     Motor: No weakness.     Coordination: Coordination normal.     Gait: Gait normal.  Psychiatric:        Mood and Affect: Mood normal.        Behavior: Behavior normal.       09/20/2021    2:46 PM 02/14/2021   10:50 AM 10/13/2020    2:51 PM  MMSE - Mini Mental State Exam  Orientation to time 4  5 5  Orientation to time comments  2022, Winter, 12-12, Monday   Orientation to Place 5 5 5   Orientation to Place-comments  Dr Gabriel Carina,  [place], [place], [place], [place]   Registration 3 3 3   Registration-comments  [place], [place], [place]   Attention/ Calculation 5 5 5   Attention/Calculation-comments  Spell WORLD backwards: [facility]   Recall 1 2 3   Recall-comments  [place], [place], ?   Language- name 2 objects 2 2 2   Language- name 2 objects-comments  [place], [place]   Language- repeat 1 1 1   Language- repeat-comments  No If's, and's or But's   Language- follow 3 step command 3 2 3   Language- follow 3 step command-comments  Pick paper up with right hand (used her left), fold in half and place on bed.   Language- read & follow direction 1 1 1   Language-read & follow direction-comments  "Close your eyes."   Write a sentence 1 1 1   Write a sentence-comments  "I love my son!"   Copy design 1 1 1   Total score 27 28 30      Diabetic Foot Exam - Simple   No data filed      Lab Results  Component Value Date   WBC 6.4 09/21/2021   HGB 9.3 (L) 09/21/2021   HCT 28.2 (L) 09/21/2021   PLT 215 09/21/2021   GLUCOSE 90 09/21/2021   CHOL 132 01/30/2021   TRIG 133 01/30/2021   HDL 64 01/30/2021   LDLCALC 45 01/30/2021   ALT 9 09/21/2021   AST 14 09/21/2021   NA 143 09/21/2021   K 4.3 09/21/2021   CL 108 (H) 09/21/2021   CREATININE 0.63 09/21/2021   BUN 20 09/21/2021   CO2 22 09/21/2021   TSH 1.220 07/11/2021    Assessment & Plan:   Problem List Items Addressed This Visit       Cardiovascular and Mediastinum   Aortic atherosclerosis     Respiratory   Simple chronic bronchitis - Primary    The current medical regimen is effective;  continue present plan and medications.      Aspiration pneumonia of upper lobe ([place])    resolved      Acute respiratory failure with hypoxia (H[facility])    Resolved        Other   Hyperlipidemia    Check labs.      Relevant Orders   CBC with Differential/Platelet (Completed)   Comprehensive metabolic panel (Completed)   Severe protein-calorie malnutrition Altamease Oiler: less than 60% of standard  weight) ([place])    Recommend continue to eat 3 meals per day.  Recommend protein shakes.  Continue megace      Hyponatremia    Check cmp.       Tobacco dependency    Recommend continued cessation.  Sent nicoderm patch      Orders Placed This Encounter  Procedures   CBC with Differential/Platelet   Comprehensive metabolic panel    Follow-up: Return in about 2 weeks (around 10/04/2021) for chronic follow up.  An After Visit Summary was printed and given to the patient.  [name], MD [facility] [phone]

## 2021-09-21 ENCOUNTER — Other Ambulatory Visit: Payer: Medicare HMO

## 2021-09-21 ENCOUNTER — Telehealth: Payer: Self-pay

## 2021-09-21 DIAGNOSIS — E782 Mixed hyperlipidemia: Secondary | ICD-10-CM | POA: Diagnosis not present

## 2021-09-21 NOTE — Telephone Encounter (Signed)
I called patient and let her know about to call [facility] and tell them do not send the prescription. She was agreed.

## 2021-09-21 NOTE — Chronic Care Management (AMB) (Signed)
Chronic Care Management Pharmacy Assistant   Name: Anne [name]  MRN: [id number] DOB: 10-16-1945   Reason for Encounter: Medication Review   Request to onboard patient to [facility], Prescriptions were sent to [facility] for 90 day packaging system to be delivered on 09/22/2021 are listed below: Cetirizine 10 mg- 1 tablet at breakfast Trelegy Ellipta  200-62.5-25- 1 puff into lungs daily Gabapentin 300 mg- 2 tablets three times a day Tamoxifen 20 mg- 1 tablet at bedtime Rizatriptan 10 mg- 1 tablet with onset of headache, may repeat in 2 hours as needed.  Quetiapine 100 mg- 1/2 tablet at bedtime Megestrol Ac 20 mg- 2 tablets before breakfast Sertraline 50 mg- 1 tablet at breakfast Trazodone 100 mg- 1 tablet at bedtime Omeprazole 40 mg- 1 tablet twice daily at breakfast and evening meal Meloxicam 15 mg- I tablet at breakfast Mirtazapine 45 mg- 1 tablet at bedtime Nicotine 24hr Patch 21 mg- Use as directed Albuterol Aer HFA- Inhale 1-2 puffs q6h prn wheezing or shortness of breath.    Called patient to review medication and direction on timing of each, no answer, left message to return call.   Called patient again, we reviewed medications on hand and how she takes and time of day she takes medications: Cetirizine 10 mg- 1 tablet at breakfast (Patient needs refill) Trelegy Ellipta  200-62.5-25- 1 puff into lungs daily (Patient states she needs) Gabapentin 300 mg- 2 tablets three times a day (Patient states she has an abundance) Tamoxifen 20 mg- 1 tablet at bedtime (Patient needs refill) Rizatriptan 10 mg- 1 tablet with onset of headache, may repeat in 2 hours as needed. (Patient has enough) Quetiapine 100 mg- 1/2 tablet at bedtime ( Patient needs refill) Megestrol Ac 20 mg- 2 tablets before breakfast (Patient needs refill) Sertraline 50 mg- 1 tablet at breakfast (Patient states she has about 30 left) Trazodone 100 mg- 1 tablet at bedtime (Patient states she has about  50 left) Omeprazole 40 mg- 1 tablet twice daily at breakfast and evening meal (Patient states she has 1 left) Meloxicam 15 mg- I tablet at breakfast (Patient states she has 50-75 left) Mirtazapine 45 mg- 1 tablet at bedtime (Patient states she has about 100 left) Nicotine 24hr Patch 21 mg- Use as directed (Patient needs) Albuterol Aer HFA- Inhale 1-2 puffs q6h prn wheezing or shortness of breath. (Patient has 2 inhalers left) Hydrocodone 5/325 mg- 1 tablet daily as needed for severe migraine pain. (Patient needs) refill request sent to clinical team  Amitriptyline 25 mg- 1 capsule at bedtime (Patient states she has about 30 left)  Sent onboard form to [facility] for coordination of delivery for 09/22/2021.  09/22/2021- [facility] was able to speak with patient to inform of delivery today, patient reviewed medications on hand and from Rehab discharge papers with pharmacy technician. Per [facility]:  New meds: Amitriptyline 75 mg, Primidone 50 mg Omeprazole: 3/4 bottle Quetiapine: she has 100 mg and 50 mg on hand Meloxicam: 50 on hand Gabapentin and Mirtazapine "out her ears"  Trelegy: she has one on hand  Patient will be receiving a 30 DS of the following medications today: Cetirizine 10 mg- 1 tablet at breakfast  Tamoxifen 20 mg- 1 tablet at bedtime Megestrol Ac 20 mg- 2 tablets before breakfast  Sertraline 50 mg- 1 tablet at breakfast  Nicotine 24hr Patch 21 mg- Use as directed  Hydrocodone 5/325 mg- 1 tablet daily as needed for severe migraine pain.  Updating onboard form but noticed that  some medications patient is requesting were not on medication list. Sent task to Dr Tobie Poet and [place] [facility] for clarification and prescriptions if needed on : Amitriptyline 75 mg 1 tablet daily, Primidone 50 mg 2 times daily, Propanolol 40 mg tid, Melatonin 1 mg bedtime, [name] 60 mg daily, and Trulance 3 mg daily.  When clarification has been received will update [facility].  Received  clarification from Dr Tobie Poet, she does not want patient on medications in question above. Finishing up onboard form with correct medications and notifying [facility].  Medications: Outpatient Encounter Medications as of 09/21/2021  Medication Sig   albuterol (VENTOLIN HFA) 108 (90 Base) MCG/ACT inhaler INHALE 1 TO 2 PUFFS BY MOUTH EVERY 6 HOURS AS NEEDED FOR WHEEZING FOR SHORTNESS OF BREATH   cetirizine (ZYRTEC) 10 MG tablet Take 1 tablet (10 mg total) by mouth daily.   Fluticasone-Umeclidin-Vilant (TRELEGY ELLIPTA) 200-62.5-25 MCG/ACT AEPB Inhale 1 puff into the lungs daily.   gabapentin (NEURONTIN) 300 MG capsule Take 2 capsules (600 mg total) by mouth 3 (three) times daily.   HYDROcodone-acetaminophen (NORCO/VICODIN) 5-325 MG tablet Take 1 tablet by mouth daily as needed for severe pain (migraines).   megestrol (MEGACE) 20 MG tablet Take 2 tablets (40 mg total) by mouth daily.   melatonin 1 MG TABS tablet Take by mouth.   meloxicam (MOBIC) 15 MG tablet Take 1 tablet (15 mg total) by mouth daily.   mirtazapine (REMERON) 45 MG tablet Take 1 tablet (45 mg total) by mouth at bedtime.   nicotine (NICODERM CQ - DOSED IN MG/24 HOURS) 21 mg/24hr patch Place 1 patch (21 mg total) onto the skin daily.   omeprazole (PRILOSEC) 40 MG capsule Take 1 capsule (40 mg total) by mouth 2 (two) times daily.   QUEtiapine (SEROQUEL) 100 MG tablet Take 0.5 tablets (50 mg total) by mouth at bedtime.   rizatriptan (MAXALT-MLT) 10 MG disintegrating tablet May repeat in 2 hours if needed   sertraline (ZOLOFT) 50 MG tablet Take 1 tablet (50 mg total) by mouth daily.   tamoxifen (NOLVADEX) 20 MG tablet Take 1 tablet (20 mg total) by mouth daily.   traZODone (DESYREL) 100 MG tablet Take 1 tablet (100 mg total) by mouth at bedtime as needed for sleep.   No facility-administered encounter medications on file as of 09/21/2021.    [name], [facility] Pharmacist Assistant [phone]

## 2021-09-22 ENCOUNTER — Other Ambulatory Visit: Payer: Self-pay

## 2021-09-22 DIAGNOSIS — M5416 Radiculopathy, lumbar region: Secondary | ICD-10-CM

## 2021-09-22 LAB — COMPREHENSIVE METABOLIC PANEL
ALT: 9 IU/L (ref 0–32)
AST: 14 IU/L (ref 0–40)
Albumin/Globulin Ratio: 1.5 (ref 1.2–2.2)
Albumin: 3.4 g/dL — ABNORMAL LOW (ref 3.8–4.8)
Alkaline Phosphatase: 75 IU/L (ref 44–121)
BUN/Creatinine Ratio: 32 — ABNORMAL HIGH (ref 12–28)
BUN: 20 mg/dL (ref 8–27)
Bilirubin Total: 0.4 mg/dL (ref 0.0–1.2)
CO2: 22 mmol/L (ref 20–29)
Calcium: 8.7 mg/dL (ref 8.7–10.3)
Chloride: 108 mmol/L — ABNORMAL HIGH (ref 96–106)
Creatinine, Ser: 0.63 mg/dL (ref 0.57–1.00)
Globulin, Total: 2.2 g/dL (ref 1.5–4.5)
Glucose: 90 mg/dL (ref 70–99)
Potassium: 4.3 mmol/L (ref 3.5–5.2)
Sodium: 143 mmol/L (ref 134–144)
Total Protein: 5.6 g/dL — ABNORMAL LOW (ref 6.0–8.5)
eGFR: 92 mL/min/{1.73_m2} (ref >59)

## 2021-09-22 LAB — CBC WITH DIFFERENTIAL/PLATELET
Basophils Absolute: 0.1 10*3/uL (ref 0.0–0.2)
Basos: 1 %
EOS (ABSOLUTE): 0.1 10*3/uL (ref 0.0–0.4)
Eos: 2 %
Hematocrit: 28.2 % — ABNORMAL LOW (ref 34.0–46.6)
Hemoglobin: 9.3 g/dL — ABNORMAL LOW (ref 11.1–15.9)
Immature Grans (Abs): 0 10*3/uL (ref 0.0–0.1)
Immature Granulocytes: 1 %
Lymphocytes Absolute: 1.9 10*3/uL (ref 0.7–3.1)
Lymphs: 30 %
MCH: 30.7 pg (ref 26.6–33.0)
MCHC: 33 g/dL (ref 31.5–35.7)
MCV: 93 fL (ref 79–97)
Monocytes Absolute: 0.6 10*3/uL (ref 0.1–0.9)
Monocytes: 10 %
Neutrophils Absolute: 3.7 10*3/uL (ref 1.4–7.0)
Neutrophils: 56 %
Platelets: 215 10*3/uL (ref 150–450)
RBC: 3.03 x10E6/uL — ABNORMAL LOW (ref 3.77–5.28)
RDW: 14.8 % (ref 11.7–15.4)
WBC: 6.4 10*3/uL (ref 3.4–10.8)

## 2021-09-22 MED ORDER — HYDROCODONE-ACETAMINOPHEN 5-325 MG PO TABS: 1.0000 | ORAL_TABLET | Freq: Every day | ORAL | 0 refills | Status: DC | PRN

## 2021-09-22 NOTE — Assessment & Plan Note (Signed)
Check labs 

## 2021-09-22 NOTE — Assessment & Plan Note (Signed)
The current medical regimen is effective;  continue present plan and medications.  

## 2021-09-24 DIAGNOSIS — J690 Pneumonitis due to inhalation of food and vomit: Secondary | ICD-10-CM | POA: Insufficient documentation

## 2021-09-24 DIAGNOSIS — E871 Hypo-osmolality and hyponatremia: Secondary | ICD-10-CM | POA: Insufficient documentation

## 2021-09-24 NOTE — Assessment & Plan Note (Addendum)
Recommend continue to eat 3 meals per day.  Recommend protein shakes.  Continue megace

## 2021-09-25 ENCOUNTER — Telehealth: Payer: Self-pay

## 2021-09-25 NOTE — Telephone Encounter (Signed)
We received a call from Home physical therapy that Anne [name] fell three times yesterday.  She had no apparent injuries and is feeling ok today.  Dr. Tobie Poet made aware of the falls.

## 2021-10-01 ENCOUNTER — Encounter: Payer: Self-pay | Admitting: Family Medicine

## 2021-10-01 DIAGNOSIS — J9601 Acute respiratory failure with hypoxia: Secondary | ICD-10-CM | POA: Insufficient documentation

## 2021-10-01 DIAGNOSIS — F17200 Nicotine dependence, unspecified, uncomplicated: Secondary | ICD-10-CM | POA: Insufficient documentation

## 2021-10-01 NOTE — Assessment & Plan Note (Signed)
Resolved

## 2021-10-01 NOTE — Assessment & Plan Note (Signed)
resolved 

## 2021-10-01 NOTE — Assessment & Plan Note (Signed)
Check cmp 

## 2021-10-01 NOTE — Assessment & Plan Note (Signed)
Recommend continued cessation.  Sent nicoderm patch

## 2021-10-04 ENCOUNTER — Other Ambulatory Visit: Payer: Self-pay | Admitting: Physician Assistant

## 2021-10-04 ENCOUNTER — Other Ambulatory Visit: Payer: Self-pay | Admitting: Family Medicine

## 2021-10-04 ENCOUNTER — Encounter: Payer: Self-pay | Admitting: Family Medicine

## 2021-10-04 ENCOUNTER — Ambulatory Visit (INDEPENDENT_AMBULATORY_CARE_PROVIDER_SITE_OTHER): Payer: Medicare HMO | Admitting: Family Medicine

## 2021-10-04 VITALS — BP 92/46 | HR 72 | Temp 97.1°F | Resp 14 | Ht 61.0 in | Wt 80.2 lb

## 2021-10-04 DIAGNOSIS — D6489 Other specified anemias: Secondary | ICD-10-CM

## 2021-10-04 DIAGNOSIS — F17200 Nicotine dependence, unspecified, uncomplicated: Secondary | ICD-10-CM

## 2021-10-04 DIAGNOSIS — E43 Unspecified severe protein-calorie malnutrition: Secondary | ICD-10-CM | POA: Diagnosis not present

## 2021-10-04 DIAGNOSIS — J410 Simple chronic bronchitis: Secondary | ICD-10-CM

## 2021-10-04 DIAGNOSIS — R413 Other amnesia: Secondary | ICD-10-CM

## 2021-10-04 DIAGNOSIS — R296 Repeated falls: Secondary | ICD-10-CM | POA: Diagnosis not present

## 2021-10-04 DIAGNOSIS — M810 Age-related osteoporosis without current pathological fracture: Secondary | ICD-10-CM | POA: Diagnosis not present

## 2021-10-04 DIAGNOSIS — D649 Anemia, unspecified: Secondary | ICD-10-CM | POA: Diagnosis not present

## 2021-10-04 DIAGNOSIS — E782 Mixed hyperlipidemia: Secondary | ICD-10-CM

## 2021-10-04 DIAGNOSIS — M545 Low back pain, unspecified: Secondary | ICD-10-CM

## 2021-10-04 DIAGNOSIS — G8929 Other chronic pain: Secondary | ICD-10-CM

## 2021-10-04 NOTE — Progress Notes (Signed)
Subjective:  Patient ID: Anne [name], female    DOB: [birth year]  Age: 76 y.o. MRN: [id number]  Chief Complaint  Patient presents with   COPD   Hyperlipidemia   severe malnutrition    HPI Patient Lost 2 lbs. Patient is getting MOM's Meals 2 meals per day. On megace 20 mg 2 daily. Protein shakes twice a day. Makes one meal herself.  Legs gave way. twice in the two weeks. Not using walker.  Has seen physical therapy and OT. Doing exercises daily given by therapist.  Sleeping wonderfully. Relaxing more. Worry is down.   COPD: On trelegy one inhalation daily and albuterol 2 puffs four times a day as needed. On nicoderm patches. Patient is trying not to smoke. She is smoking 1/2 ppd.   Current Outpatient Medications on File Prior to Visit  Medication Sig Dispense Refill   albuterol (VENTOLIN HFA) 108 (90 Base) MCG/ACT inhaler INHALE 1 TO 2 PUFFS BY MOUTH EVERY 6 HOURS AS NEEDED FOR WHEEZING FOR SHORTNESS OF BREATH 18 g 0   cetirizine (ZYRTEC) 10 MG tablet Take 1 tablet (10 mg total) by mouth daily. 30 tablet 11   Fluticasone-Umeclidin-Vilant (TRELEGY ELLIPTA) 200-62.5-25 MCG/ACT AEPB Inhale 1 puff into the lungs daily. 3 each 3   HYDROcodone-acetaminophen (NORCO/VICODIN) 5-325 MG tablet Take 1 tablet by mouth daily as needed for severe pain (migraines). 30 tablet 0   mirtazapine (REMERON) 45 MG tablet Take 1 tablet (45 mg total) by mouth at bedtime. 90 tablet 1   nicotine (NICODERM CQ - DOSED IN MG/24 HOURS) 21 mg/24hr patch Place 1 patch (21 mg total) onto the skin daily. 28 patch 1   omeprazole (PRILOSEC) 40 MG capsule Take 1 capsule (40 mg total) by mouth 2 (two) times daily. 180 capsule 1   rizatriptan (MAXALT-MLT) 10 MG disintegrating tablet May repeat in 2 hours if needed 10 tablet 2   No current facility-administered medications on file prior to visit.   Past Medical History:  Diagnosis Date   Acquired absence of both breasts and nipples 06/08/2015   Acute infection of nasal  sinus 05/04/2021   Aortic atherosclerosis 01/30/2021   Asthma    daily inhalers   Ataxia 10/18/2020   Bilateral carpal tunnel syndrome 01/30/2021   Bipolar disorder, mixed 06/16/2021   Breast cancer of upper-inner quadrant of left female breast 04/15/2015   Carotid artery disease 05/04/2016   Mild, 1-39% bilaterally by doppler [name] 2018   Chronic pain in right shoulder 12/25/2020   COPD with acute exacerbation 07/03/2016   Dental crowns present    Drug induced constipation 04/10/2021   EIC (epidermal inclusion cyst) 03/21/2021   Family history of breast cancer in female 04/26/2015   Dx. Niece at 86; dx. Maternal aunt in her late 66s  Formatting of this note might be different from the original. Overview:  Dx. Niece at 27; dx. Maternal aunt in her late 60s   Family history of colon cancer 04/26/2015   Dx. In maternal grandfather in his late 51s-70  Formatting of this note might be different from the original. Overview:  Dx. In maternal grandfather in his late 59s-70   Frequent falls 10/18/2020   Genetic testing 06/02/2015   Negative for pathogenic mutations within any of 20 genes on the Breast/Ovarian Cancer Panel through [employer].  No variants of uncertain significance (VUSes) were found.  The Breast/Ovarian Cancer Panel offered by [facility] Hope Pigeon, MD) includes sequencing and deletion/duplication analysis for the following 19 genes:  ATM, BARD1, BRCA1, BRCA2, BRIP1, CDH1, CHEK2, FANCC, M   History of basal cell carcinoma 04/26/2015   History of concussion 01/01/2014   History of kidney stones    History of TIA (transient ischemic attack) 10/2014   Hyperlipidemia    Insomnia    Lumbar back pain 10/18/2020   Major depressive disorder 10/18/2020   Malrotation of intestine 09/23/2017   Migraine aura without headache 02/08/2014   Migraine headache 01/30/2021   Nasal congestion 05/26/2015   will finish z-pak 05/27/2015   Nonproductive cough 05/26/2015    PONV (postoperative nausea and vomiting)    "long time ago"  none recently   Psychogenic syncope 04/23/2019   Right lumbar radiculopathy 04/23/2019   RLS (restless legs syndrome)    Simple chronic bronchitis 07/04/2019   Tobacco use    Tremor 10/18/2020   Vitamin D deficiency    Wears dentures    Weight loss 08/28/2019   Past Surgical History:  Procedure Laterality Date   ABDOMINAL HYSTERECTOMY     APPENDECTOMY  2000   BREAST BIOPSY Left    BREAST RECONSTRUCTION WITH PLACEMENT OF TISSUE EXPANDER AND FLEX HD (ACELLULAR HYDRATED DERMIS) Bilateral 06/02/2015   Procedure: BILATERAL BREAST RECONSTRUCTION WITH PLACEMENT OF TISSUE EXPANDER AND  ACELLULAR HYDRATED DERMIS;  Surgeon: Irene Limbo, MD;  Location: [facility];  Service: Plastics;  Laterality: Bilateral;   CARPAL TUNNEL RELEASE Bilateral    CATARACT EXTRACTION Bilateral 09/2013   KNEE ARTHROSCOPY Left    LAPAROSCOPIC ABDOMINAL EXPLORATION N/A 10/2017   LAPAROSCOPIC LYSIS OF CONGENITAL ADHESIVE BAND/LADD'S BANDS WIH INTRAOPERATIVE CHOLANGIOGRAPHY. Perforation of small intestion requiring repair.    LOOP RECORDER INSERTION N/A 05/11/2016   Procedure: Loop Recorder Insertion;  Surgeon: Will Meredith Leeds, MD;  Location: [facility];  Service: Cardiovascular;  Laterality: N/A;   MASTECTOMY W/ SENTINEL NODE BIOPSY Bilateral 06/02/2015   Procedure: BILATERAL MASTECTOMY WITH LEFT SENTINEL LYMPH NODE BIOPSY;  Surgeon: Stark Klein, MD;  Location: [facility];  Service: General;  Laterality: Bilateral;   NASAL SEPTUM SURGERY     x 3   PLANTAR FASCIA SURGERY Left    REMOVAL OF BILATERAL TISSUE EXPANDERS WITH PLACEMENT OF BILATERAL BREAST IMPLANTS Bilateral 06/17/2015   Procedure: DEBRIDEMENT OF BILATERAL MASTECTOMY FLAP WITH BILATERAL TISSUE EXPANDER EXCHANGE ;  Surgeon: Irene Limbo, MD;  Location: [facility];  Service: Plastics;  Laterality: Bilateral;   REMOVAL OF TISSUE EXPANDER Bilateral 07/12/2015    Procedure: REMOVAL OF BILATERAL TISSUE EXPANDERS;  Surgeon: Irene Limbo, MD;  Location: [facility];  Service: Plastics;  Laterality: Bilateral;    Family History  Problem Relation Age of Onset   Cervical cancer Mother 16   Colon cancer Maternal Grandfather        mets to stomach; dx. 67-70   Heart disease Father    Prostate cancer Father 73   Cervical cancer Maternal Grandmother        dx. 63s; treated with radium implant   Lung cancer Sister 43       maternal half-sister dx. lung cancer, stage III   Breast cancer Maternal Aunt    Cervical cancer Sister 29       paternal half-sister; s/p TAH   Spina bifida Grandchild    Brain cancer Grandchild        grandson dx. at 20 mos, treated at [facility] Other 32       maternal half-sister's daughter   Cancer Maternal Aunt  d. 76; unspecified type - "began at back area and moved to vital organs"   Cancer Maternal Uncle         late 60s; unspecified type; "started at back and moved to vital organs"   Social History   Socioeconomic History   Marital status: Widowed    Spouse name: Not on file   Number of children: 1   Years of education: 37   Highest education level: Not on file  Occupational History    Comment: [employer], retired  Tobacco Use   Smoking status: Former    Packs/day: 0.15    Types: Cigarettes    Quit date: 03/21/2020    Years since quitting: 1.5   Smokeless tobacco: Never  Vaping Use   Vaping Use: Never used  Substance and Sexual Activity   Alcohol use: Yes    Alcohol/week: 0.0 standard drinks of alcohol    Comment: rarely   Drug use: No   Sexual activity: Not on file  Other Topics Concern   Not on file  Social History Narrative   Lives alone, widow   Caffeine use- tea, 1 cup daily   Right Handed    Lives in a one story [place] home    Social Determinants of Health   Financial Resource Strain: Low Risk  (02/17/2021)   Overall Financial Resource Strain  (CARDIA)    Difficulty of Paying Living Expenses: Not hard at all  Food Insecurity: Not on file  Transportation Needs: No Transportation Needs (02/17/2021)   PRAPARE - Hydrologist (Medical): No    Lack of Transportation (Non-Medical): No  Physical Activity: Not on file  Stress: Not on file  Social Connections: Not on file    Review of Systems  Constitutional:  Positive for fatigue. Negative for chills and fever.  HENT:  Negative for congestion, rhinorrhea and sore throat.   Respiratory:  Negative for cough and shortness of breath.   Cardiovascular:  Negative for chest pain.  Gastrointestinal:  Negative for abdominal pain, constipation, diarrhea, nausea and vomiting.  Genitourinary:  Negative for dysuria and urgency.  Musculoskeletal:  Negative for back pain and myalgias.  Neurological:  Positive for weakness. Negative for dizziness, light-headedness and headaches.  Psychiatric/Behavioral:  Positive for dysphoric mood. The patient is not nervous/anxious.    Objective:  BP (!) 92/46   Pulse 72   Temp (!) 97.1 F (36.2 C)   Resp 14   Ht _0  (1.549 m)   Wt 80 lb 3.2 oz (36.4 kg)   BMI 15.15 kg/m      10/19/2021   10:44 AM 10/04/2021   10:04 AM 09/20/2021    2:52 PM  BP/Weight  Systolic BP  92 197  Diastolic BP  46 58  Wt. (Lbs) 81 80.2   BMI 15.3 kg/m2 15.15 kg/m2     Physical Exam Vitals reviewed.  Constitutional:      Appearance: Normal appearance.     Comments: THIN  Neck:     Vascular: No carotid bruit.  Cardiovascular:     Rate and Rhythm: Normal rate and regular rhythm.     Heart sounds: Normal heart sounds.  Pulmonary:     Effort: Pulmonary effort is normal. No respiratory distress.     Breath sounds: Normal breath sounds.  Abdominal:     General: Abdomen is flat. Bowel sounds are normal.     Palpations: Abdomen is soft.     Tenderness: There is no  abdominal tenderness.  Musculoskeletal:     Comments: UE and LE 5/5 BL.   Neurological:     Mental Status: She is alert and oriented to person, place, and time.  Psychiatric:        Mood and Affect: Mood normal.        Behavior: Behavior normal.       09/20/2021    2:46 PM 02/14/2021   10:50 AM 10/13/2020    2:51 PM  MMSE - Mini Mental State Exam  Orientation to time _0 Orientation to time comments  2022, Winter, 12-12, Monday   Orientation to Place _1 Orientation to Place-comments  Dr Gabriel Carina, [place], [place], [place], [place]   Registration _2 Registration-comments  [place], [place], [place]   Attention/ Calculation _3 Attention/Calculation-comments  Spell WORLD backwards: [facility]   Recall _4 Recall-comments  [place], [place], ?   Language- name 2 objects _5 Language- name 2 objects-comments  [place], [place]   Language- repeat _6 Language- repeat-comments  No If's, and's or But's   Language- follow 3 step command _7 Language- follow 3 step command-comments  Pick paper up with right hand (used her left), fold in half and place on bed.   Language- read & follow direction _8 Language-read & follow direction-comments  "Close your eyes."   Write a sentence _9 Write a sentence-comments  "I love my son!"   Copy design _10 Total score _11 Diabetic Foot Exam - Simple   No data filed      Lab Results  Component Value Date   WBC 7.8 10/19/2021   HGB 11.4 10/19/2021   HCT 36.4 10/19/2021   PLT 254 10/19/2021   GLUCOSE 68 (L) 10/19/2021   CHOL 132 01/30/2021   TRIG 133 01/30/2021   HDL 64 01/30/2021   LDLCALC 45 01/30/2021   ALT 11 10/19/2021   AST 23 10/19/2021   NA 141 10/19/2021   K 4.8 10/19/2021   CL 107 (H) 10/19/2021   CREATININE 0.90 10/19/2021   BUN 23 10/19/2021   CO2 14 (L) 10/19/2021   TSH 1.220 07/11/2021      Assessment & Plan:   Problem List Items Addressed This Visit       Respiratory   Simple chronic bronchitis    The current medical regimen is effective;  continue present plan and  medications. Continue trelegy and albuterol.        Musculoskeletal and Integument   Age-related osteoporosis without current pathological fracture - Primary   Relevant Orders   VITAMIN D 25 Hydroxy (Vit-D Deficiency, Fractures) (Completed)     Other   Hyperlipidemia    Continue to work on eating a healthy diet.      Relevant Orders   Comprehensive metabolic panel (Completed)   Recurrent falls    I am concerned about her weakness. I have not approved her for driving or returning to work.      Chronic midline low back pain without sciatica   Severe protein-calorie malnutrition Altamease Oiler: less than 60% of standard weight) (HCC)    No changes to recommendations      Memory changes    Check labs Family says she is quite forgetful even though she did fairly well on MMSE. 27/30.      Tobacco dependency  Recommend cessation.      Normocytic normochromic anemia    Check labs.     .  No orders of the defined types were placed in this encounter.   Orders Placed This Encounter  Procedures   CBC with Differential/Platelet   Comprehensive metabolic panel   G71 and Folate Panel   Methylmalonic acid, serum   VITAMIN D 25 Hydroxy (Vit-D Deficiency, Fractures)     Follow-up: Return in about 2 weeks (around 10/18/2021) for chronic follow up.  An After Visit Summary was printed and given to the patient.  [name], MD [facility] [phone]

## 2021-10-04 NOTE — Patient Instructions (Signed)
Gabapentin 300 mg 3 twice a day.

## 2021-10-05 ENCOUNTER — Other Ambulatory Visit: Payer: Self-pay | Admitting: Family Medicine

## 2021-10-05 ENCOUNTER — Telehealth: Payer: Self-pay

## 2021-10-05 ENCOUNTER — Other Ambulatory Visit: Payer: Medicare HMO

## 2021-10-05 DIAGNOSIS — Z9181 History of falling: Secondary | ICD-10-CM | POA: Diagnosis not present

## 2021-10-05 DIAGNOSIS — G43909 Migraine, unspecified, not intractable, without status migrainosus: Secondary | ICD-10-CM | POA: Diagnosis not present

## 2021-10-05 DIAGNOSIS — R627 Adult failure to thrive: Secondary | ICD-10-CM | POA: Diagnosis not present

## 2021-10-05 DIAGNOSIS — J9601 Acute respiratory failure with hypoxia: Secondary | ICD-10-CM | POA: Diagnosis not present

## 2021-10-05 DIAGNOSIS — Z7951 Long term (current) use of inhaled steroids: Secondary | ICD-10-CM | POA: Diagnosis not present

## 2021-10-05 DIAGNOSIS — D6489 Other specified anemias: Secondary | ICD-10-CM | POA: Diagnosis not present

## 2021-10-05 DIAGNOSIS — M810 Age-related osteoporosis without current pathological fracture: Secondary | ICD-10-CM | POA: Diagnosis not present

## 2021-10-05 DIAGNOSIS — E782 Mixed hyperlipidemia: Secondary | ICD-10-CM | POA: Diagnosis not present

## 2021-10-05 MED ORDER — QUETIAPINE FUMARATE 50 MG PO TABS: 50.0000 mg | ORAL_TABLET | Freq: Every day | ORAL | 0 refills | Status: DC

## 2021-10-05 NOTE — Telephone Encounter (Signed)
Nurse from [place] called back, He got a message about if the patient should be driving. He stated "that he feels on her good days she will  be able to drive but she also have bad days where she is dehydrated and she shouldn't be driving. If you have any questions you can give him a call back at [phone]

## 2021-10-05 NOTE — Telephone Encounter (Signed)
Patient came into office for lab work. Patient is requesting a muscle relaxer as she feels her shoulders are tight. She has been working with physical therapy and feels their exercises are causing this.   After speaking with patient, she left and returned to office to ask about this same issue. She was again told a message would be sent to the provider.  Royce Macadamia, [place] 10/05/21 12:19 PM

## 2021-10-06 NOTE — Telephone Encounter (Signed)
Spoke with patient, she VU.   Royce Macadamia, [place] 10/06/21 8:14 AM

## 2021-10-08 DIAGNOSIS — D649 Anemia, unspecified: Secondary | ICD-10-CM | POA: Insufficient documentation

## 2021-10-08 DIAGNOSIS — D6489 Other specified anemias: Secondary | ICD-10-CM | POA: Insufficient documentation

## 2021-10-08 DIAGNOSIS — M810 Age-related osteoporosis without current pathological fracture: Secondary | ICD-10-CM | POA: Insufficient documentation

## 2021-10-08 NOTE — Assessment & Plan Note (Addendum)
The current medical regimen is effective;  continue present plan and medications. Continue trelegy and albuterol.

## 2021-10-08 NOTE — Assessment & Plan Note (Signed)
Check labs 

## 2021-10-08 NOTE — Assessment & Plan Note (Addendum)
Continue to work on eating a healthy diet.

## 2021-10-09 ENCOUNTER — Telehealth: Payer: Self-pay

## 2021-10-09 NOTE — Telephone Encounter (Signed)
Anne [name] called about the paperwork needed for her drivers license.  Dr. Tobie Poet faxed the needed paperwork last week to [facility].  She should be notified by them.  Patient was informed that the needed paperwork was faxed and that it is out of our hands at this point.

## 2021-10-11 LAB — COMPREHENSIVE METABOLIC PANEL
ALT: 8 IU/L (ref 0–32)
AST: 12 IU/L (ref 0–40)
Albumin/Globulin Ratio: 1.8 (ref 1.2–2.2)
Albumin: 3.6 g/dL — ABNORMAL LOW (ref 3.8–4.8)
Alkaline Phosphatase: 76 IU/L (ref 44–121)
BUN/Creatinine Ratio: 15 (ref 12–28)
BUN: 13 mg/dL (ref 8–27)
Bilirubin Total: 0.2 mg/dL (ref 0.0–1.2)
CO2: 21 mmol/L (ref 20–29)
Calcium: 8.5 mg/dL — ABNORMAL LOW (ref 8.7–10.3)
Chloride: 106 mmol/L (ref 96–106)
Creatinine, Ser: 0.84 mg/dL (ref 0.57–1.00)
Globulin, Total: 2 g/dL (ref 1.5–4.5)
Glucose: 98 mg/dL (ref 70–99)
Potassium: 4.3 mmol/L (ref 3.5–5.2)
Sodium: 141 mmol/L (ref 134–144)
Total Protein: 5.6 g/dL — ABNORMAL LOW (ref 6.0–8.5)
eGFR: 72 mL/min/{1.73_m2} (ref >59)

## 2021-10-11 LAB — CBC WITH DIFFERENTIAL/PLATELET
Basophils Absolute: 0 10*3/uL (ref 0.0–0.2)
Basos: 0 %
EOS (ABSOLUTE): 0.2 10*3/uL (ref 0.0–0.4)
Eos: 1 %
Hematocrit: 34.3 % (ref 34.0–46.6)
Hemoglobin: 11 g/dL — ABNORMAL LOW (ref 11.1–15.9)
Immature Grans (Abs): 0.1 10*3/uL (ref 0.0–0.1)
Immature Granulocytes: 1 %
Lymphocytes Absolute: 1.9 10*3/uL (ref 0.7–3.1)
Lymphs: 16 %
MCH: 31.3 pg (ref 26.6–33.0)
MCHC: 32.1 g/dL (ref 31.5–35.7)
MCV: 97 fL (ref 79–97)
Monocytes Absolute: 1 10*3/uL — ABNORMAL HIGH (ref 0.1–0.9)
Monocytes: 8 %
Neutrophils Absolute: 9.2 10*3/uL — ABNORMAL HIGH (ref 1.4–7.0)
Neutrophils: 74 %
Platelets: 231 10*3/uL (ref 150–450)
RBC: 3.52 x10E6/uL — ABNORMAL LOW (ref 3.77–5.28)
RDW: 14.1 % (ref 11.7–15.4)
WBC: 12.4 10*3/uL — ABNORMAL HIGH (ref 3.4–10.8)

## 2021-10-11 LAB — B12 AND FOLATE PANEL
Folate: 6.9 ng/mL (ref >3.0)
Vitamin B-12: 465 pg/mL (ref 232–1245)

## 2021-10-11 LAB — METHYLMALONIC ACID, SERUM: Methylmalonic Acid: 955 nmol/L — ABNORMAL HIGH (ref 0–378)

## 2021-10-11 LAB — VITAMIN D 25 HYDROXY (VIT D DEFICIENCY, FRACTURES): Vit D, 25-Hydroxy: 34.5 ng/mL (ref 30.0–100.0)

## 2021-10-13 ENCOUNTER — Other Ambulatory Visit: Payer: Self-pay

## 2021-10-13 ENCOUNTER — Telehealth: Payer: Self-pay

## 2021-10-13 NOTE — Telephone Encounter (Signed)
Message left that follow-up appointment has been scheduled for next week to complete DMV forms.  I was unable to leave a message at her home phone but a message was left on her cell number.

## 2021-10-16 ENCOUNTER — Other Ambulatory Visit: Payer: Self-pay | Admitting: Family Medicine

## 2021-10-16 ENCOUNTER — Telehealth: Payer: Self-pay

## 2021-10-16 DIAGNOSIS — Z5321 Procedure and treatment not carried out due to patient leaving prior to being seen by health care provider: Secondary | ICD-10-CM | POA: Diagnosis not present

## 2021-10-16 MED ORDER — CYANOCOBALAMIN 1000 MCG/ML IJ SOLN: 1000.0000 ug | INTRAMUSCULAR | 0 refills | Status: DC

## 2021-10-16 NOTE — Telephone Encounter (Signed)
We received a call that her bp is very low.  She is experiencing dizziness and balance issues.  Dr. Tobie Poet advised that she go to the ED for evaluation.

## 2021-10-18 ENCOUNTER — Other Ambulatory Visit: Payer: Self-pay | Admitting: Family Medicine

## 2021-10-18 MED ORDER — QUETIAPINE FUMARATE 50 MG PO TABS: 50.0000 mg | ORAL_TABLET | Freq: Every day | ORAL | 0 refills | Status: DC

## 2021-10-18 MED ORDER — MEGESTROL ACETATE 20 MG PO TABS: 40.0000 mg | ORAL_TABLET | Freq: Every day | ORAL | 2 refills | Status: DC

## 2021-10-19 ENCOUNTER — Other Ambulatory Visit: Payer: Self-pay

## 2021-10-19 ENCOUNTER — Ambulatory Visit (INDEPENDENT_AMBULATORY_CARE_PROVIDER_SITE_OTHER): Payer: Medicare HMO | Admitting: Family Medicine

## 2021-10-19 VITALS — BP 120/69 | Temp 97.1°F | Resp 16 | Ht 61.0 in | Wt 81.0 lb

## 2021-10-19 DIAGNOSIS — D508 Other iron deficiency anemias: Secondary | ICD-10-CM | POA: Diagnosis not present

## 2021-10-19 DIAGNOSIS — I951 Orthostatic hypotension: Secondary | ICD-10-CM

## 2021-10-19 MED ORDER — SERTRALINE HCL 50 MG PO TABS: 50.0000 mg | ORAL_TABLET | Freq: Every day | ORAL | 1 refills | Status: DC

## 2021-10-19 MED ORDER — MELATONIN 10 MG PO TABS: 20.0000 mg | ORAL_TABLET | Freq: Every day | ORAL | 0 refills | Status: DC

## 2021-10-19 MED ORDER — MEGESTROL ACETATE 20 MG PO TABS: 40.0000 mg | ORAL_TABLET | Freq: Every day | ORAL | 1 refills | Status: DC

## 2021-10-19 MED ORDER — MIDODRINE HCL 2.5 MG PO TABS
2.5000 mg | ORAL_TABLET | Freq: Three times a day (TID) | ORAL | 0 refills | Status: DC
Start: 2021-10-19 — End: 2021-11-27

## 2021-10-19 NOTE — Patient Instructions (Addendum)
B12 2500 mg sublingual daily.   Start on midodrine 2.5 mg one twice a day. Check bp and pulse once daily or if you feel dizzy.

## 2021-10-19 NOTE — Assessment & Plan Note (Addendum)
Start on midodrine 2.5 mg one twice a day. Check bp and pulse once daily or if you feel dizzy. Check labs.

## 2021-10-19 NOTE — Progress Notes (Signed)
Subjective:  Patient ID: Anne [name], female    DOB: [birth year]  Age: 76 y.o. MRN: [id number]  Chief Complaint  Patient presents with   Complete DMV forms    HPI Anne [name] comes in to complete DMV forms.  She went to the ED several days ago for a drop in her bp but she left after waiting for 4 hours.  NO further falls. BP on Monday dropped to 60/40 and I sent to the ED where they sat for 4 hours without any VS taken and finally left.  Physical therapy is the only one coming out still. Comes once a week.  MOMs meals. Still drinking protein shakes. Gained one lb. Patient continues to have headaches that go across neck.   B12 level is low. Needs b12 shot.   Current Outpatient Medications on File Prior to Visit  Medication Sig Dispense Refill   albuterol (VENTOLIN HFA) 108 (90 Base) MCG/ACT inhaler INHALE 1 TO 2 PUFFS BY MOUTH EVERY 6 HOURS AS NEEDED FOR WHEEZING FOR SHORTNESS OF BREATH 18 g 0   cetirizine (ZYRTEC) 10 MG tablet Take 1 tablet (10 mg total) by mouth daily. 30 tablet 11   cyanocobalamin (VITAMIN B12) 1000 MCG/ML injection Inject 1 mL (1,000 mcg total) into the muscle every 14 (fourteen) days. 2 mL 0   Fluticasone-Umeclidin-Vilant (TRELEGY ELLIPTA) 200-62.5-25 MCG/ACT AEPB Inhale 1 puff into the lungs daily. 3 each 3   gabapentin (NEURONTIN) 300 MG capsule TAKE 2 CAPSULES THREE TIMES DAILY 180 capsule 2   HYDROcodone-acetaminophen (NORCO/VICODIN) 5-325 MG tablet Take 1 tablet by mouth daily as needed for severe pain (migraines). 30 tablet 0   meloxicam (MOBIC) 15 MG tablet TAKE 1 TABLET EVERY DAY 90 tablet 1   mirtazapine (REMERON) 45 MG tablet Take 1 tablet (45 mg total) by mouth at bedtime. 90 tablet 1   nicotine (NICODERM CQ - DOSED IN MG/24 HOURS) 21 mg/24hr patch Place 1 patch (21 mg total) onto the skin daily. 28 patch 1   omeprazole (PRILOSEC) 40 MG capsule Take 1 capsule (40 mg total) by mouth 2 (two) times daily. 180 capsule 1   QUEtiapine (SEROQUEL) 50 MG tablet  Take 1 tablet (50 mg total) by mouth at bedtime. 90 tablet 0   rizatriptan (MAXALT-MLT) 10 MG disintegrating tablet May repeat in 2 hours if needed 10 tablet 2   traZODone (DESYREL) 100 MG tablet TAKE 1 TABLET AT BEDTIME AS NEEDED FOR SLEEP 90 tablet 1   No current facility-administered medications on file prior to visit.   Past Medical History:  Diagnosis Date   Acquired absence of both breasts and nipples 06/08/2015   Acute infection of nasal sinus 05/04/2021   Aortic atherosclerosis 01/30/2021   Asthma    daily inhalers   Ataxia 10/18/2020   Bilateral carpal tunnel syndrome 01/30/2021   Bipolar disorder, mixed 06/16/2021   Breast cancer of upper-inner quadrant of left female breast 04/15/2015   Carotid artery disease 05/04/2016   Mild, 1-39% bilaterally by doppler [name] 2018   Chronic pain in right shoulder 12/25/2020   COPD with acute exacerbation 07/03/2016   Dental crowns present    Drug induced constipation 04/10/2021   EIC (epidermal inclusion cyst) 03/21/2021   Family history of breast cancer in female 04/26/2015   Dx. Niece at 64; dx. Maternal aunt in her late 47s  Formatting of this note might be different from the original. Overview:  Dx. Niece at 95; dx. Maternal aunt in her late 56s  Family history of colon cancer 04/26/2015   Dx. In maternal grandfather in his late 25s-70  Formatting of this note might be different from the original. Overview:  Dx. In maternal grandfather in his late 78s-70   Frequent falls 10/18/2020   Genetic testing 06/02/2015   Negative for pathogenic mutations within any of 20 genes on the Breast/Ovarian Cancer Panel through [employer].  No variants of uncertain significance (VUSes) were found.  The Breast/Ovarian Cancer Panel offered by [facility] Hope Pigeon, MD) includes sequencing and deletion/duplication analysis for the following 19 genes:  ATM, BARD1, BRCA1, BRCA2, BRIP1, CDH1, CHEK2, FANCC, M   History of basal cell  carcinoma 04/26/2015   History of concussion 01/01/2014   History of kidney stones    History of TIA (transient ischemic attack) 10/2014   Hyperlipidemia    Insomnia    Lumbar back pain 10/18/2020   Major depressive disorder 10/18/2020   Malrotation of intestine 09/23/2017   Migraine aura without headache 02/08/2014   Migraine headache 01/30/2021   Nasal congestion 05/26/2015   will finish z-pak 05/27/2015   Nonproductive cough 05/26/2015   PONV (postoperative nausea and vomiting)    "long time ago"  none recently   Psychogenic syncope 04/23/2019   Right lumbar radiculopathy 04/23/2019   RLS (restless legs syndrome)    Simple chronic bronchitis 07/04/2019   Tobacco use    Tremor 10/18/2020   Vitamin D deficiency    Wears dentures    Weight loss 08/28/2019   Past Surgical History:  Procedure Laterality Date   ABDOMINAL HYSTERECTOMY     APPENDECTOMY  2000   BREAST BIOPSY Left    BREAST RECONSTRUCTION WITH PLACEMENT OF TISSUE EXPANDER AND FLEX HD (ACELLULAR HYDRATED DERMIS) Bilateral 06/02/2015   Procedure: BILATERAL BREAST RECONSTRUCTION WITH PLACEMENT OF TISSUE EXPANDER AND  ACELLULAR HYDRATED DERMIS;  Surgeon: Irene Limbo, MD;  Location: [facility];  Service: Plastics;  Laterality: Bilateral;   CARPAL TUNNEL RELEASE Bilateral    CATARACT EXTRACTION Bilateral 09/2013   KNEE ARTHROSCOPY Left    LAPAROSCOPIC ABDOMINAL EXPLORATION N/A 10/2017   LAPAROSCOPIC LYSIS OF CONGENITAL ADHESIVE BAND/LADD'S BANDS WIH INTRAOPERATIVE CHOLANGIOGRAPHY. Perforation of small intestion requiring repair.    LOOP RECORDER INSERTION N/A 05/11/2016   Procedure: Loop Recorder Insertion;  Surgeon: Will Meredith Leeds, MD;  Location: [facility];  Service: Cardiovascular;  Laterality: N/A;   MASTECTOMY W/ SENTINEL NODE BIOPSY Bilateral 06/02/2015   Procedure: BILATERAL MASTECTOMY WITH LEFT SENTINEL LYMPH NODE BIOPSY;  Surgeon: Stark Klein, MD;  Location: [facility];   Service: General;  Laterality: Bilateral;   NASAL SEPTUM SURGERY     x 3   PLANTAR FASCIA SURGERY Left    REMOVAL OF BILATERAL TISSUE EXPANDERS WITH PLACEMENT OF BILATERAL BREAST IMPLANTS Bilateral 06/17/2015   Procedure: DEBRIDEMENT OF BILATERAL MASTECTOMY FLAP WITH BILATERAL TISSUE EXPANDER EXCHANGE ;  Surgeon: Irene Limbo, MD;  Location: [facility];  Service: Plastics;  Laterality: Bilateral;   REMOVAL OF TISSUE EXPANDER Bilateral 07/12/2015   Procedure: REMOVAL OF BILATERAL TISSUE EXPANDERS;  Surgeon: Irene Limbo, MD;  Location: [facility];  Service: Plastics;  Laterality: Bilateral;    Family History  Problem Relation Age of Onset   Cervical cancer Mother 15   Colon cancer Maternal Grandfather        mets to stomach; dx. 67-70   Heart disease Father    Prostate cancer Father 58   Cervical cancer Maternal Grandmother  dx. 24s; treated with radium implant   Lung cancer Sister 72       maternal half-sister dx. lung cancer, stage III   Breast cancer Maternal Aunt    Cervical cancer Sister 55       paternal half-sister; s/p TAH   Spina bifida Grandchild    Brain cancer Grandchild        grandson dx. at 20 mos, treated at [facility] Other 8       maternal half-sister's daughter   Cancer Maternal Aunt        d. 60; unspecified type - "began at back area and moved to vital organs"   Cancer Maternal Uncle         late 9s; unspecified type; "started at back and moved to vital organs"   Social History   Socioeconomic History   Marital status: Widowed    Spouse name: Not on file   Number of children: 1   Years of education: 63   Highest education level: Not on file  Occupational History    Comment: [employer], retired  Tobacco Use   Smoking status: Former    Packs/day: 0.15    Types: Cigarettes    Quit date: 03/21/2020    Years since quitting: 1.6   Smokeless tobacco: Never  Vaping Use   Vaping Use: Never used  Substance and  Sexual Activity   Alcohol use: Yes    Alcohol/week: 0.0 standard drinks of alcohol    Comment: rarely   Drug use: No   Sexual activity: Not on file  Other Topics Concern   Not on file  Social History Narrative   Lives alone, widow   Caffeine use- tea, 1 cup daily   Right Handed    Lives in a one story [place] home    Social Determinants of Health   Financial Resource Strain: Low Risk  (02/17/2021)   Overall Financial Resource Strain (CARDIA)    Difficulty of Paying Living Expenses: Not hard at all  Food Insecurity: Not on file  Transportation Needs: No Transportation Needs (02/17/2021)   PRAPARE - Hydrologist (Medical): No    Lack of Transportation (Non-Medical): No  Physical Activity: Not on file  Stress: Not on file  Social Connections: Not on file    Review of Systems  Constitutional:  Negative for chills, fatigue and fever.  HENT:  Negative for congestion, rhinorrhea and sore throat.   Respiratory:  Negative for cough and shortness of breath.   Cardiovascular:  Negative for chest pain.  Gastrointestinal:  Negative for abdominal pain, constipation, diarrhea, nausea and vomiting.  Genitourinary:  Negative for dysuria and urgency.  Musculoskeletal:  Positive for back pain. Negative for myalgias.  Neurological:  Positive for light-headedness and headaches. Negative for dizziness and weakness.  Psychiatric/Behavioral:  Negative for dysphoric mood. The patient is not nervous/anxious.      Objective:  BP 120/69   Temp (!) 97.1 F (36.2 C)   Resp 16   Ht 5' 1" (1.549 m)   Wt 81 lb (36.7 kg)   BMI 15.30 kg/m      10/24/2021    2:19 PM 10/24/2021    2:18 PM 10/19/2021   10:44 AM  BP/Weight  Systolic BP 72 90 161  Diastolic BP 40 48 69  Wt. (Lbs)   81  BMI   15.3 kg/m2   Supine: 130/78, Pulse 78 Sitting: 110/63, Pulse 72 Standing: 82/50, Pulse  78.  Physical Exam Vitals reviewed.  Constitutional:      Appearance: Normal appearance.      Comments: thin  Neck:     Vascular: No carotid bruit.  Cardiovascular:     Rate and Rhythm: Normal rate and regular rhythm.     Heart sounds: Normal heart sounds.  Pulmonary:     Effort: Pulmonary effort is normal. No respiratory distress.     Breath sounds: Normal breath sounds.  Abdominal:     General: Abdomen is flat. Bowel sounds are normal.     Palpations: Abdomen is soft.     Tenderness: There is no abdominal tenderness.  Neurological:     General: No focal deficit present.     Mental Status: She is alert and oriented to person, place, and time.     Motor: No weakness.     Coordination: Coordination normal.     Gait: Gait normal.  Psychiatric:        Mood and Affect: Mood normal.        Behavior: Behavior normal.     Diabetic Foot Exam - Simple   No data filed      Lab Results  Component Value Date   WBC 7.8 10/19/2021   HGB 11.4 10/19/2021   HCT 36.4 10/19/2021   PLT 254 10/19/2021   GLUCOSE 68 (L) 10/19/2021   CHOL 132 01/30/2021   TRIG 133 01/30/2021   HDL 64 01/30/2021   LDLCALC 45 01/30/2021   ALT 11 10/19/2021   AST 23 10/19/2021   NA 141 10/19/2021   K 4.8 10/19/2021   CL 107 (H) 10/19/2021   CREATININE 0.90 10/19/2021   BUN 23 10/19/2021   CO2 14 (L) 10/19/2021   TSH 1.220 07/11/2021      Assessment & Plan:   Problem List Items Addressed This Visit       Cardiovascular and Mediastinum   Orthostatic hypotension    Start on midodrine 2.5 mg one twice a day. Check bp and pulse once daily or if you feel dizzy. Check labs.      Relevant Medications   midodrine (PROAMATINE) 2.5 MG tablet   Other Relevant Orders   Comprehensive metabolic panel (Completed)     Other   Normocytic normochromic anemia - Primary    B12 2500 mg sublingual daily. Check  Labs.     .  Meds ordered this encounter  Medications   melatonin 10 MG TABS    Sig: Take 20 mg by mouth at bedtime.    Dispense:  1 tablet    Refill:  0   midodrine  (PROAMATINE) 2.5 MG tablet    Sig: Take 1 tablet (2.5 mg total) by mouth 3 (three) times daily with meals.    Dispense:  270 tablet    Refill:  0    Orders Placed This Encounter  Procedures   CBC with Differential/Platelet   Comprehensive metabolic panel     Follow-up: Return in about 3 months (around 01/19/2022).  An After Visit Summary was printed and given to the patient.  [name], MD  Family Practice [phone]

## 2021-10-19 NOTE — Assessment & Plan Note (Addendum)
B12 2500 mg sublingual daily. Check  Labs.

## 2021-10-20 DIAGNOSIS — F4321 Adjustment disorder with depressed mood: Secondary | ICD-10-CM | POA: Diagnosis not present

## 2021-10-20 LAB — CBC WITH DIFFERENTIAL/PLATELET
Basophils Absolute: 0.1 10*3/uL (ref 0.0–0.2)
Basos: 1 %
EOS (ABSOLUTE): 0.3 10*3/uL (ref 0.0–0.4)
Eos: 3 %
Hematocrit: 36.4 % (ref 34.0–46.6)
Hemoglobin: 11.4 g/dL (ref 11.1–15.9)
Immature Grans (Abs): 0 10*3/uL (ref 0.0–0.1)
Immature Granulocytes: 0 %
Lymphocytes Absolute: 2.1 10*3/uL (ref 0.7–3.1)
Lymphs: 27 %
MCH: 30.8 pg (ref 26.6–33.0)
MCHC: 31.3 g/dL — ABNORMAL LOW (ref 31.5–35.7)
MCV: 98 fL — ABNORMAL HIGH (ref 79–97)
Monocytes Absolute: 0.5 10*3/uL (ref 0.1–0.9)
Monocytes: 7 %
Neutrophils Absolute: 4.8 10*3/uL (ref 1.4–7.0)
Neutrophils: 62 %
Platelets: 254 10*3/uL (ref 150–450)
RBC: 3.7 x10E6/uL — ABNORMAL LOW (ref 3.77–5.28)
RDW: 13.7 % (ref 11.7–15.4)
WBC: 7.8 10*3/uL (ref 3.4–10.8)

## 2021-10-20 LAB — COMPREHENSIVE METABOLIC PANEL
ALT: 11 IU/L (ref 0–32)
AST: 23 IU/L (ref 0–40)
Albumin/Globulin Ratio: 1.5 (ref 1.2–2.2)
Albumin: 4 g/dL (ref 3.8–4.8)
Alkaline Phosphatase: 86 IU/L (ref 44–121)
BUN/Creatinine Ratio: 26 (ref 12–28)
BUN: 23 mg/dL (ref 8–27)
Bilirubin Total: <0.2 mg/dL (ref 0.0–1.2)
CO2: 14 mmol/L — ABNORMAL LOW (ref 20–29)
Calcium: 9.3 mg/dL (ref 8.7–10.3)
Chloride: 107 mmol/L — ABNORMAL HIGH (ref 96–106)
Creatinine, Ser: 0.9 mg/dL (ref 0.57–1.00)
Globulin, Total: 2.7 g/dL (ref 1.5–4.5)
Glucose: 68 mg/dL — ABNORMAL LOW (ref 70–99)
Potassium: 4.8 mmol/L (ref 3.5–5.2)
Sodium: 141 mmol/L (ref 134–144)
Total Protein: 6.7 g/dL (ref 6.0–8.5)
eGFR: 67 mL/min/{1.73_m2} (ref >59)

## 2021-10-22 NOTE — Progress Notes (Signed)
Blood count normal.  Liver function normal.  Kidney function normal.  Except Sugar is low.  Patient has to eat.

## 2021-10-22 NOTE — Assessment & Plan Note (Signed)
Recommend cessation. ?

## 2021-10-22 NOTE — Assessment & Plan Note (Addendum)
Check labs Family says she is quite forgetful even though she did fairly well on MMSE. 27/30.

## 2021-10-22 NOTE — Assessment & Plan Note (Signed)
I am concerned about her weakness. I have not approved her for driving or returning to work.

## 2021-10-22 NOTE — Assessment & Plan Note (Signed)
No changes to recommendations

## 2021-10-24 ENCOUNTER — Ambulatory Visit (INDEPENDENT_AMBULATORY_CARE_PROVIDER_SITE_OTHER): Payer: Medicare HMO

## 2021-10-24 ENCOUNTER — Telehealth: Payer: Self-pay

## 2021-10-24 VITALS — BP 72/40 | HR 72

## 2021-10-24 DIAGNOSIS — E538 Deficiency of other specified B group vitamins: Secondary | ICD-10-CM | POA: Diagnosis not present

## 2021-10-24 MED ORDER — CYANOCOBALAMIN 1000 MCG/ML IJ SOLN
1000.0000 ug | Freq: Once | INTRAMUSCULAR | Status: AC
  Administered 2021-10-24: 1000 ug via INTRAMUSCULAR

## 2021-10-24 NOTE — Progress Notes (Signed)
Lab results were reviewed with patient.  Her blood pressure remains low.  Dr. Tobie Poet advised that she continue to push fluids and eat a well balanced diet.

## 2021-10-24 NOTE — Telephone Encounter (Signed)
Patient left voicemail stating she needs a refill on her blood pressure medication, I dont see on unless I am over looking it.

## 2021-10-25 NOTE — Telephone Encounter (Signed)
A message was left for patient to call back with the name of the medication needed.

## 2021-10-25 NOTE — Telephone Encounter (Signed)
Patient returned call. She was told the pharmacy did not have her hypotension medication but was unsure of name.   Documentation states she is to be taking midodrine 2.5 mg and was sent to [facility] on 8/17. Patient was made aware, patient wrote down name of medication. She will be awaiting the arrival of it.   Royce Macadamia, [place] 10/25/21 1:34 PM

## 2021-10-26 ENCOUNTER — Encounter: Payer: Self-pay | Admitting: Family Medicine

## 2021-11-02 ENCOUNTER — Ambulatory Visit (INDEPENDENT_AMBULATORY_CARE_PROVIDER_SITE_OTHER): Payer: Medicare HMO

## 2021-11-02 ENCOUNTER — Telehealth: Payer: Self-pay

## 2021-11-02 DIAGNOSIS — E538 Deficiency of other specified B group vitamins: Secondary | ICD-10-CM | POA: Diagnosis not present

## 2021-11-02 MED ORDER — CYANOCOBALAMIN 1000 MCG/ML IJ SOLN
1000.0000 ug | Freq: Once | INTRAMUSCULAR | Status: AC
  Administered 2021-11-02: 1000 ug via INTRAMUSCULAR

## 2021-11-02 NOTE — Telephone Encounter (Signed)
Patient came in the office this morning to have her B12 injection done, and the patient was given the shot. Patient was questioning when she would be able to drive again? She states she needs to get back to work. I told the patient we would ask and call her back. Please advise.

## 2021-11-02 NOTE — Telephone Encounter (Signed)
[place]. Royce Macadamia, [place] 11/02/21 2:23 PM

## 2021-11-03 NOTE — Telephone Encounter (Signed)
Anne [name], Anne [name] 11/03/21 8:29 AM

## 2021-11-06 ENCOUNTER — Other Ambulatory Visit: Payer: Self-pay | Admitting: Family Medicine

## 2021-11-08 ENCOUNTER — Telehealth: Payer: Self-pay

## 2021-11-13 ENCOUNTER — Other Ambulatory Visit: Payer: Self-pay

## 2021-11-13 ENCOUNTER — Other Ambulatory Visit: Payer: Self-pay | Admitting: Family Medicine

## 2021-11-13 ENCOUNTER — Encounter: Payer: Self-pay | Admitting: Family Medicine

## 2021-11-13 MED ORDER — CYANOCOBALAMIN 1000 MCG/ML IJ SOLN: 1000.0000 ug | INTRAMUSCULAR | 0 refills | Status: DC

## 2021-11-16 ENCOUNTER — Encounter: Payer: Self-pay | Admitting: Family Medicine

## 2021-11-16 ENCOUNTER — Ambulatory Visit (INDEPENDENT_AMBULATORY_CARE_PROVIDER_SITE_OTHER): Payer: Medicare HMO | Admitting: Family Medicine

## 2021-11-16 VITALS — BP 130/70 | HR 74 | Temp 96.5°F | Resp 16 | Ht 61.0 in | Wt 84.0 lb

## 2021-11-16 DIAGNOSIS — R202 Paresthesia of skin: Secondary | ICD-10-CM | POA: Diagnosis not present

## 2021-11-16 DIAGNOSIS — Z853 Personal history of malignant neoplasm of breast: Secondary | ICD-10-CM

## 2021-11-16 DIAGNOSIS — I951 Orthostatic hypotension: Secondary | ICD-10-CM | POA: Diagnosis not present

## 2021-11-16 DIAGNOSIS — J441 Chronic obstructive pulmonary disease with (acute) exacerbation: Secondary | ICD-10-CM

## 2021-11-16 DIAGNOSIS — E538 Deficiency of other specified B group vitamins: Secondary | ICD-10-CM | POA: Diagnosis not present

## 2021-11-16 MED ORDER — CEFTRIAXONE SODIUM 1 G IJ SOLR
1.0000 g | Freq: Once | INTRAMUSCULAR | Status: AC
  Administered 2021-11-16: 1 g via INTRAMUSCULAR

## 2021-11-16 MED ORDER — CYANOCOBALAMIN 1000 MCG/ML IJ SOLN
1000.0000 ug | Freq: Once | INTRAMUSCULAR | Status: AC
  Administered 2021-11-16: 1000 ug via INTRAMUSCULAR

## 2021-11-16 MED ORDER — CYANOCOBALAMIN 1000 MCG/ML IJ SOLN: INTRAMUSCULAR | 0 refills | Status: DC

## 2021-11-16 MED ORDER — AMOXICILLIN-POT CLAVULANATE 875-125 MG PO TABS: 1.0000 | ORAL_TABLET | Freq: Two times a day (BID) | ORAL | 0 refills | Status: DC

## 2021-11-16 MED ORDER — PREDNISONE 50 MG PO TABS: 50.0000 mg | ORAL_TABLET | Freq: Every day | ORAL | 0 refills | Status: DC

## 2021-11-16 MED ORDER — TRIAMCINOLONE ACETONIDE 40 MG/ML IJ SUSP
80.0000 mg | Freq: Once | INTRAMUSCULAR | Status: AC
  Administered 2021-11-16: 80 mg via INTRAMUSCULAR

## 2021-11-16 NOTE — Progress Notes (Signed)
Subjective:  Patient ID: Anne [name], female    DOB: June 25, 1945  Age: 76 y.o. MRN: [id number]  Chief Complaint  Patient presents with   Hypotension   Anemia    HPI Patient is here to complete DMV forms. She does not have the privilege to drive and she is her to be evaluated. She needs to drive to be able to go work and continue with her life. I have been monitoring her over the last 3 months since was hospitalized.  Patient mentioned that she has one day with dry cough, sinus pressure and runny nose. Wheezing a little and chest tightness.   Current Outpatient Medications on File Prior to Visit  Medication Sig Dispense Refill   albuterol (VENTOLIN HFA) 108 (90 Base) MCG/ACT inhaler INHALE 1 TO 2 PUFFS BY MOUTH EVERY 6 HOURS AS NEEDED FOR WHEEZING FOR SHORTNESS OF BREATH 18 g 0   amitriptyline (ELAVIL) 75 MG tablet Take 75 mg by mouth at bedtime.     cetirizine (ZYRTEC) 10 MG tablet Take 1 tablet (10 mg total) by mouth daily. 30 tablet 11   Fluticasone-Umeclidin-Vilant (TRELEGY ELLIPTA) 200-62.5-25 MCG/ACT AEPB Inhale 1 puff into the lungs daily. 3 each 3   gabapentin (NEURONTIN) 300 MG capsule TAKE 2 CAPSULES THREE TIMES DAILY 180 capsule 2   HYDROcodone-acetaminophen (NORCO/VICODIN) 5-325 MG tablet Take 1 tablet by mouth daily as needed for severe pain (migraines). 30 tablet 0   megestrol (MEGACE) 20 MG tablet Take 2 tablets (40 mg total) by mouth daily. 180 tablet 1   melatonin 10 MG TABS Take 20 mg by mouth at bedtime. 1 tablet 0   meloxicam (MOBIC) 15 MG tablet TAKE 1 TABLET EVERY DAY 90 tablet 1   midodrine (PROAMATINE) 2.5 MG tablet Take 1 tablet (2.5 mg total) by mouth 3 (three) times daily with meals. 270 tablet 0   mirtazapine (REMERON) 45 MG tablet Take 1 tablet (45 mg total) by mouth at bedtime. 90 tablet 1   NICOTINE STEP 1 21 MG/24HR patch PLACE ONE PATCH onto THE SKIN DAILY 30 patch 0   omeprazole (PRILOSEC) 40 MG capsule Take 1 capsule (40 mg total) by mouth 2 (two)  times daily. 180 capsule 1   orphenadrine (NORFLEX) 100 MG tablet Take 100 mg by mouth 2 (two) times daily as needed.     QUEtiapine (SEROQUEL) 50 MG tablet Take 1 tablet (50 mg total) by mouth at bedtime. 90 tablet 0   rizatriptan (MAXALT-MLT) 10 MG disintegrating tablet DISSOLVE 1 TABLET ON THE TONGUE AT ONSET OF MIGRAINE, REPEAT IN 2 HOURS IF NEEDED 10 tablet 2   sertraline (ZOLOFT) 50 MG tablet Take 1 tablet (50 mg total) by mouth daily. 90 tablet 1   traZODone (DESYREL) 100 MG tablet TAKE 1 TABLET AT BEDTIME AS NEEDED FOR SLEEP 90 tablet 1   No current facility-administered medications on file prior to visit.   Past Medical History:  Diagnosis Date   Acquired absence of both breasts and nipples 06/08/2015   Acute infection of nasal sinus 05/04/2021   Aortic atherosclerosis 01/30/2021   Asthma    daily inhalers   Ataxia 10/18/2020   Bilateral carpal tunnel syndrome 01/30/2021   Bipolar disorder, mixed 06/16/2021   Breast cancer of upper-inner quadrant of left female breast 04/15/2015   Carotid artery disease 05/04/2016   Mild, 1-39% bilaterally by doppler [name] 2018   Chronic pain in right shoulder 12/25/2020   COPD with acute exacerbation 07/03/2016   Dental crowns  present    Drug induced constipation 04/10/2021   EIC (epidermal inclusion cyst) 03/21/2021   Family history of breast cancer in female 04/26/2015   Dx. Niece at 37; dx. Maternal aunt in her late 18s  Formatting of this note might be different from the original. Overview:  Dx. Niece at 46; dx. Maternal aunt in her late 38s   Family history of colon cancer 04/26/2015   Dx. In maternal grandfather in his late 63s-70  Formatting of this note might be different from the original. Overview:  Dx. In maternal grandfather in his late 23s-70   Frequent falls 10/18/2020   Genetic testing 06/02/2015   Negative for pathogenic mutations within any of 20 genes on the Breast/Ovarian Cancer Panel through [employer].  No  variants of uncertain significance (VUSes) were found.  The Breast/Ovarian Cancer Panel offered by [facility] Hope Pigeon, MD) includes sequencing and deletion/duplication analysis for the following 19 genes:  ATM, BARD1, BRCA1, BRCA2, BRIP1, CDH1, CHEK2, FANCC, M   History of basal cell carcinoma 04/26/2015   History of concussion 01/01/2014   History of kidney stones    History of TIA (transient ischemic attack) 10/2014   Hyperlipidemia    Insomnia    Lumbar back pain 10/18/2020   Major depressive disorder 10/18/2020   Malrotation of intestine 09/23/2017   Migraine aura without headache 02/08/2014   Migraine headache 01/30/2021   Nasal congestion 05/26/2015   will finish z-pak 05/27/2015   Nonproductive cough 05/26/2015   PONV (postoperative nausea and vomiting)    "long time ago"  none recently   Psychogenic syncope 04/23/2019   Right lumbar radiculopathy 04/23/2019   RLS (restless legs syndrome)    Simple chronic bronchitis 07/04/2019   Tobacco use    Tremor 10/18/2020   Vitamin D deficiency    Wears dentures    Weight loss 08/28/2019   Past Surgical History:  Procedure Laterality Date   ABDOMINAL HYSTERECTOMY     APPENDECTOMY  2000   BREAST BIOPSY Left    BREAST RECONSTRUCTION WITH PLACEMENT OF TISSUE EXPANDER AND FLEX HD (ACELLULAR HYDRATED DERMIS) Bilateral 06/02/2015   Procedure: BILATERAL BREAST RECONSTRUCTION WITH PLACEMENT OF TISSUE EXPANDER AND  ACELLULAR HYDRATED DERMIS;  Surgeon: Irene Limbo, MD;  Location: [facility];  Service: Plastics;  Laterality: Bilateral;   CARPAL TUNNEL RELEASE Bilateral    CATARACT EXTRACTION Bilateral 09/2013   KNEE ARTHROSCOPY Left    LAPAROSCOPIC ABDOMINAL EXPLORATION N/A 10/2017   LAPAROSCOPIC LYSIS OF CONGENITAL ADHESIVE BAND/LADD'S BANDS WIH INTRAOPERATIVE CHOLANGIOGRAPHY. Perforation of small intestion requiring repair.    LOOP RECORDER INSERTION N/A 05/11/2016   Procedure: Loop Recorder Insertion;   Surgeon: Will Meredith Leeds, MD;  Location: [facility];  Service: Cardiovascular;  Laterality: N/A;   MASTECTOMY W/ SENTINEL NODE BIOPSY Bilateral 06/02/2015   Procedure: BILATERAL MASTECTOMY WITH LEFT SENTINEL LYMPH NODE BIOPSY;  Surgeon: Stark Klein, MD;  Location: [facility];  Service: General;  Laterality: Bilateral;   NASAL SEPTUM SURGERY     x 3   PLANTAR FASCIA SURGERY Left    REMOVAL OF BILATERAL TISSUE EXPANDERS WITH PLACEMENT OF BILATERAL BREAST IMPLANTS Bilateral 06/17/2015   Procedure: DEBRIDEMENT OF BILATERAL MASTECTOMY FLAP WITH BILATERAL TISSUE EXPANDER EXCHANGE ;  Surgeon: Irene Limbo, MD;  Location: [facility];  Service: Plastics;  Laterality: Bilateral;   REMOVAL OF TISSUE EXPANDER Bilateral 07/12/2015   Procedure: REMOVAL OF BILATERAL TISSUE EXPANDERS;  Surgeon: Irene Limbo, MD;  Location: [facility];  Service: Clinical cytogeneticist;  Laterality: Bilateral;    Family History  Problem Relation Age of Onset   Cervical cancer Mother 62   Colon cancer Maternal Grandfather        mets to stomach; dx. 67-70   Heart disease Father    Prostate cancer Father 61   Cervical cancer Maternal Grandmother        dx. 45s; treated with radium implant   Lung cancer Sister 37       maternal half-sister dx. lung cancer, stage III   Breast cancer Maternal Aunt    Cervical cancer Sister 82       paternal half-sister; s/p TAH   Spina bifida Grandchild    Brain cancer Grandchild        grandson dx. at 20 mos, treated at [facility] Other 42       maternal half-sister's daughter   Cancer Maternal Aunt        d. 39; unspecified type - "began at back area and moved to vital organs"   Cancer Maternal Uncle         late 22s; unspecified type; "started at back and moved to vital organs"   Social History   Socioeconomic History   Marital status: Widowed    Spouse name: Not on file   Number of children: 1   Years of education: 31   Highest education  level: Not on file  Occupational History    Comment: [employer], retired  Tobacco Use   Smoking status: Every Day    Packs/day: 0.15    Types: Cigarettes    Last attempt to quit: 03/21/2020    Years since quitting: 1.6   Smokeless tobacco: Never  Vaping Use   Vaping Use: Never used  Substance and Sexual Activity   Alcohol use: Yes    Alcohol/week: 0.0 standard drinks of alcohol    Comment: rarely   Drug use: No   Sexual activity: Not on file  Other Topics Concern   Not on file  Social History Narrative   Lives alone, widow   Caffeine use- tea, 1 cup daily   Right Handed    Lives in a one story [place] home    Social Determinants of Health   Financial Resource Strain: Low Risk  (02/17/2021)   Overall Financial Resource Strain (CARDIA)    Difficulty of Paying Living Expenses: Not hard at all  Food Insecurity: Not on file  Transportation Needs: No Transportation Needs (02/17/2021)   PRAPARE - Hydrologist (Medical): No    Lack of Transportation (Non-Medical): No  Physical Activity: Not on file  Stress: Not on file  Social Connections: Not on file    Review of Systems  Constitutional:  Negative for chills, fatigue and fever.  HENT:  Positive for congestion, postnasal drip and sinus pressure. Negative for ear pain and sore throat.   Respiratory:  Positive for cough, shortness of breath and wheezing. Negative for chest tightness.   Cardiovascular:  Negative for chest pain and palpitations.  Gastrointestinal:  Negative for abdominal pain, constipation, diarrhea, nausea and vomiting.  Endocrine: Negative for polydipsia, polyphagia and polyuria.  Genitourinary:  Negative for difficulty urinating and dysuria.  Musculoskeletal:  Negative for arthralgias, back pain and myalgias.  Skin:  Negative for rash.  Neurological:  Negative for headaches.  Psychiatric/Behavioral:  Negative for dysphoric mood. The patient is not nervous/anxious.       Objective:  BP 130/70  Pulse 74   Temp (!) 96.5 F (35.8 C)   Resp 16   Ht 5' 1"  (1.549 m)   Wt 84 lb (38.1 kg)   BMI 15.87 kg/m      11/23/2021    3:39 PM 11/16/2021   10:46 AM 10/24/2021    2:19 PM  BP/Weight  Systolic BP 84 540 72  Diastolic BP 60 70 40  Wt. (Lbs) 84 84   BMI 15.87 kg/m2 15.87 kg/m2     Physical Exam Vitals reviewed.  Constitutional:      Comments: thin  Neck:     Vascular: No carotid bruit.  Cardiovascular:     Rate and Rhythm: Normal rate and regular rhythm.     Heart sounds: Normal heart sounds.  Pulmonary:     Effort: Pulmonary effort is normal. No respiratory distress.     Breath sounds: Wheezing present.  Abdominal:     General: Abdomen is flat. Bowel sounds are normal.     Palpations: Abdomen is soft.     Tenderness: There is no abdominal tenderness.  Neurological:     Mental Status: She is alert and oriented to person, place, and time.  Psychiatric:        Mood and Affect: Mood normal.        Behavior: Behavior normal.     Diabetic Foot Exam - Simple   No data filed      Lab Results  Component Value Date   WBC 14.4 (H) 11/23/2021   HGB 12.1 11/23/2021   HCT 37.4 11/23/2021   PLT 248 11/23/2021   GLUCOSE 87 11/23/2021   CHOL 132 01/30/2021   TRIG 133 01/30/2021   HDL 64 01/30/2021   LDLCALC 45 01/30/2021   ALT 31 11/23/2021   AST 40 11/23/2021   NA 138 11/23/2021   K 4.0 11/23/2021   CL 103 11/23/2021   CREATININE 0.72 11/23/2021   BUN 24 11/23/2021   CO2 24 11/23/2021   TSH 1.220 07/11/2021      Assessment & Plan:   Problem List Items Addressed This Visit       Cardiovascular and Mediastinum   Orthostatic hypotension - Primary    Improved on midodrine 2.5 mg three times a day.         Respiratory   COPD exacerbation ([place])    Rx: augmentin, prednisone.  Given rocephin 1 gm IM and kenalog 80 mg IM.       Relevant Medications   amoxicillin-clavulanate (AUGMENTIN) 875-125 MG tablet     Other    Personal history of breast cancer    Order a mammogram      Relevant Orders   MM DIGITAL SCREENING BILATERAL   Vitamin B12 deficiency    B12 shot given.       Relevant Medications   cyanocobalamin (VITAMIN B12) 1000 MCG/ML injection   Paresthesia    Likely due to b12 deficiency.       Relevant Orders   B12 and Folate Panel  . Cleared to drive. DMV Papers sent.   Meds ordered this encounter  Medications   cyanocobalamin (VITAMIN B12) injection 1,000 mcg   amoxicillin-clavulanate (AUGMENTIN) 875-125 MG tablet    Sig: Take 1 tablet by mouth 2 (two) times daily.    Dispense:  20 tablet    Refill:  0   triamcinolone acetonide (KENALOG-40) injection 80 mg   cefTRIAXone (ROCEPHIN) injection 1 g   DISCONTD: predniSONE (DELTASONE) 50 MG tablet    Sig:  Take 1 tablet (50 mg total) by mouth daily with breakfast.    Dispense:  5 tablet    Refill:  0   cyanocobalamin (VITAMIN B12) 1000 MCG/ML injection    Sig: INJECT 1ML INTO THE MUSCLE EVERY 14 DAYS    Dispense:  6 mL    Refill:  0    Orders Placed This Encounter  Procedures   MM DIGITAL SCREENING BILATERAL   B12 and Folate Panel     Follow-up: Return in about 6 weeks (around 12/28/2021) for Nurse visit b12 shot in 2 weeks and in 4 weeks. .  An After Visit Summary was printed and given to the patient.  [name], MD [facility] [phone]

## 2021-11-16 NOTE — Patient Instructions (Addendum)
Changes to medicines: Stop norphenadrine (norflex) in am.  Stop tamoxifen. Ordering a mammogram. You will receive a call for this.    Acute bronchitis: Kenalog shot 80 mg today Rocephin (antibiotic) shot 1 gm given today Augmentin (antibiotic) once daily Sent prednisone (start in 2 days if not improving.  Recommend quit smoking.   Approved to drive.

## 2021-11-23 ENCOUNTER — Encounter: Payer: Self-pay | Admitting: Family Medicine

## 2021-11-23 ENCOUNTER — Ambulatory Visit (INDEPENDENT_AMBULATORY_CARE_PROVIDER_SITE_OTHER): Payer: Medicare HMO | Admitting: Family Medicine

## 2021-11-23 VITALS — BP 84/60 | HR 62 | Temp 97.5°F | Resp 16 | Ht 61.0 in | Wt 84.0 lb

## 2021-11-23 DIAGNOSIS — E538 Deficiency of other specified B group vitamins: Secondary | ICD-10-CM | POA: Insufficient documentation

## 2021-11-23 DIAGNOSIS — J441 Chronic obstructive pulmonary disease with (acute) exacerbation: Secondary | ICD-10-CM

## 2021-11-23 DIAGNOSIS — E860 Dehydration: Secondary | ICD-10-CM | POA: Diagnosis not present

## 2021-11-23 LAB — POCT URINALYSIS DIP (CLINITEK)
Blood, UA: NEGATIVE
Glucose, UA: NEGATIVE mg/dL
Ketones, POC UA: NEGATIVE mg/dL
Leukocytes, UA: NEGATIVE
Nitrite, UA: NEGATIVE
Spec Grav, UA: 1.025 (ref 1.010–1.025)
Urobilinogen, UA: 0.2 E.U./dL
pH, UA: 5 (ref 5.0–8.0)

## 2021-11-23 LAB — COMPREHENSIVE METABOLIC PANEL
ALT: 31 IU/L (ref 0–32)
AST: 40 IU/L (ref 0–40)
Albumin/Globulin Ratio: 1.7 (ref 1.2–2.2)
Albumin: 3.8 g/dL (ref 3.8–4.8)
Alkaline Phosphatase: 74 IU/L (ref 44–121)
BUN/Creatinine Ratio: 33 — ABNORMAL HIGH (ref 12–28)
BUN: 24 mg/dL (ref 8–27)
Bilirubin Total: <0.2 mg/dL (ref 0.0–1.2)
CO2: 24 mmol/L (ref 20–29)
Calcium: 9.2 mg/dL (ref 8.7–10.3)
Chloride: 103 mmol/L (ref 96–106)
Creatinine, Ser: 0.72 mg/dL (ref 0.57–1.00)
Globulin, Total: 2.3 g/dL (ref 1.5–4.5)
Glucose: 87 mg/dL (ref 70–99)
Potassium: 4 mmol/L (ref 3.5–5.2)
Sodium: 138 mmol/L (ref 134–144)
Total Protein: 6.1 g/dL (ref 6.0–8.5)
eGFR: 87 mL/min/{1.73_m2} (ref >59)

## 2021-11-23 LAB — CBC WITH DIFFERENTIAL/PLATELET
Basophils Absolute: 0 10*3/uL (ref 0.0–0.2)
Basos: 0 %
EOS (ABSOLUTE): 0 10*3/uL (ref 0.0–0.4)
Eos: 0 %
Hematocrit: 37.4 % (ref 34.0–46.6)
Hemoglobin: 12.1 g/dL (ref 11.1–15.9)
Immature Grans (Abs): 0.2 10*3/uL — ABNORMAL HIGH (ref 0.0–0.1)
Immature Granulocytes: 1 %
Lymphocytes Absolute: 2.3 10*3/uL (ref 0.7–3.1)
Lymphs: 16 %
MCH: 30.7 pg (ref 26.6–33.0)
MCHC: 32.4 g/dL (ref 31.5–35.7)
MCV: 95 fL (ref 79–97)
Monocytes Absolute: 1.3 10*3/uL — ABNORMAL HIGH (ref 0.1–0.9)
Monocytes: 9 %
Neutrophils Absolute: 10.5 10*3/uL — ABNORMAL HIGH (ref 1.4–7.0)
Neutrophils: 74 %
Platelets: 248 10*3/uL (ref 150–450)
RBC: 3.94 x10E6/uL (ref 3.77–5.28)
RDW: 11.9 % (ref 11.7–15.4)
WBC: 14.4 10*3/uL — ABNORMAL HIGH (ref 3.4–10.8)

## 2021-11-23 MED ORDER — AZITHROMYCIN 250 MG PO TABS: ORAL_TABLET | ORAL | 0 refills | Status: DC

## 2021-11-23 MED ORDER — PREDNISONE 20 MG PO TABS: ORAL_TABLET | ORAL | 0 refills | Status: AC

## 2021-11-23 MED ORDER — CYANOCOBALAMIN 1000 MCG/ML IJ SOLN
1000.0000 ug | Freq: Once | INTRAMUSCULAR | Status: AC
  Administered 2021-11-23: 1000 ug via INTRAMUSCULAR

## 2021-11-23 MED ORDER — IPRATROPIUM-ALBUTEROL 0.5-2.5 (3) MG/3ML IN SOLN: 3.0000 mL | Freq: Once | RESPIRATORY_TRACT | Status: DC

## 2021-11-23 NOTE — Progress Notes (Deleted)
Acute Office Visit  Subjective:    Patient ID: Anne [name], female    DOB: Apr 06, 1945, 76 y.o.   MRN: [id number]  Chief Complaint  Patient presents with   Dehydration   Wheezing    HPI: Patient is in today for copd exacerbation. Cough, shortness of breath, dizziness, dehydration.  Past Medical History:  Diagnosis Date   Acquired absence of both breasts and nipples 06/08/2015   Acute infection of nasal sinus 05/04/2021   Aortic atherosclerosis 01/30/2021   Asthma    daily inhalers   Ataxia 10/18/2020   Bilateral carpal tunnel syndrome 01/30/2021   Bipolar disorder, mixed 06/16/2021   Breast cancer of upper-inner quadrant of left female breast 04/15/2015   Carotid artery disease 05/04/2016   Mild, 1-39% bilaterally by doppler in March 2018   Chronic pain in right shoulder 12/25/2020   COPD with acute exacerbation 07/03/2016   Dental crowns present    Drug induced constipation 04/10/2021   EIC (epidermal inclusion cyst) 03/21/2021   Family history of breast cancer in female 04/26/2015   Dx. Niece at 75; dx. Maternal aunt in her late 65s  Formatting of this note might be different from the original. Overview:  Dx. Niece at 78; dx. Maternal aunt in her late 59s   Family history of colon cancer 04/26/2015   Dx. In maternal grandfather in his late 102s-70  Formatting of this note might be different from the original. Overview:  Dx. In maternal grandfather in his late 61s-70   Frequent falls 10/18/2020   Genetic testing 06/02/2015   Negative for pathogenic mutations within any of 20 genes on the Breast/Ovarian Cancer Panel through [employer].  No variants of uncertain significance (VUSes) were found.  The Breast/Ovarian Cancer Panel offered by [facility] Hope Pigeon, MD) includes sequencing and deletion/duplication analysis for the following 19 genes:  ATM, BARD1, BRCA1, BRCA2, BRIP1, CDH1, CHEK2, FANCC, M   History of basal cell carcinoma 04/26/2015    History of concussion 01/01/2014   History of kidney stones    History of TIA (transient ischemic attack) 10/2014   Hyperlipidemia    Insomnia    Lumbar back pain 10/18/2020   Major depressive disorder 10/18/2020   Malrotation of intestine 09/23/2017   Migraine aura without headache 02/08/2014   Migraine headache 01/30/2021   Nasal congestion 05/26/2015   will finish z-pak 05/27/2015   Nonproductive cough 05/26/2015   PONV (postoperative nausea and vomiting)    "long time ago"  none recently   Psychogenic syncope 04/23/2019   Right lumbar radiculopathy 04/23/2019   RLS (restless legs syndrome)    Simple chronic bronchitis 07/04/2019   Tobacco use    Tremor 10/18/2020   Vitamin D deficiency    Wears dentures    Weight loss 08/28/2019    Past Surgical History:  Procedure Laterality Date   ABDOMINAL HYSTERECTOMY     APPENDECTOMY  2000   BREAST BIOPSY Left    BREAST RECONSTRUCTION WITH PLACEMENT OF TISSUE EXPANDER AND FLEX HD (ACELLULAR HYDRATED DERMIS) Bilateral 06/02/2015   Procedure: BILATERAL BREAST RECONSTRUCTION WITH PLACEMENT OF TISSUE EXPANDER AND  ACELLULAR HYDRATED DERMIS;  Surgeon: Irene Limbo, MD;  Location: [place];  Service: Plastics;  Laterality: Bilateral;   CARPAL TUNNEL RELEASE Bilateral    CATARACT EXTRACTION Bilateral 09/2013   KNEE ARTHROSCOPY Left    LAPAROSCOPIC ABDOMINAL EXPLORATION N/A 10/2017   LAPAROSCOPIC LYSIS OF CONGENITAL ADHESIVE BAND/LADD'S BANDS WIH INTRAOPERATIVE CHOLANGIOGRAPHY. Perforation of small intestion requiring repair.  LOOP RECORDER INSERTION N/A 05/11/2016   Procedure: Loop Recorder Insertion;  Surgeon: Will Meredith Leeds, MD;  Location: [facility];  Service: Cardiovascular;  Laterality: N/A;   MASTECTOMY W/ SENTINEL NODE BIOPSY Bilateral 06/02/2015   Procedure: BILATERAL MASTECTOMY WITH LEFT SENTINEL LYMPH NODE BIOPSY;  Surgeon: Stark Klein, MD;  Location: [place];  Service: General;   Laterality: Bilateral;   NASAL SEPTUM SURGERY     x 3   PLANTAR FASCIA SURGERY Left    REMOVAL OF BILATERAL TISSUE EXPANDERS WITH PLACEMENT OF BILATERAL BREAST IMPLANTS Bilateral 06/17/2015   Procedure: DEBRIDEMENT OF BILATERAL MASTECTOMY FLAP WITH BILATERAL TISSUE EXPANDER EXCHANGE ;  Surgeon: Irene Limbo, MD;  Location: [place];  Service: Plastics;  Laterality: Bilateral;   REMOVAL OF TISSUE EXPANDER Bilateral 07/12/2015   Procedure: REMOVAL OF BILATERAL TISSUE EXPANDERS;  Surgeon: Irene Limbo, MD;  Location: [place];  Service: Plastics;  Laterality: Bilateral;    Family History  Problem Relation Age of Onset   Cervical cancer Mother 57   Colon cancer Maternal Grandfather        mets to stomach; dx. 67-70   Heart disease Father    Prostate cancer Father 62   Cervical cancer Maternal Grandmother        dx. 18s; treated with radium implant   Lung cancer Sister 76       maternal half-sister dx. lung cancer, stage III   Breast cancer Maternal Aunt    Cervical cancer Sister 49       paternal half-sister; s/p TAH   Spina bifida Grandchild    Brain cancer Grandchild        grandson dx. at 20 mos, treated at [place] cancer Other 58       maternal half-sister's daughter   Cancer Maternal Aunt        d. 54; unspecified type - "began at back area and moved to vital organs"   Cancer Maternal Uncle         late 45s; unspecified type; "started at back and moved to vital organs"    Social History   Socioeconomic History   Marital status: Widowed    Spouse name: Not on file   Number of children: 1   Years of education: 26   Highest education level: Not on file  Occupational History    Comment: [employer], retired  Tobacco Use   Smoking status: Every Day    Packs/day: 0.15    Types: Cigarettes    Last attempt to quit: 03/21/2020    Years since quitting: 1.6   Smokeless tobacco: Never  Vaping Use   Vaping Use: Never used  Substance and  Sexual Activity   Alcohol use: Yes    Alcohol/week: 0.0 standard drinks of alcohol    Comment: rarely   Drug use: No   Sexual activity: Not on file  Other Topics Concern   Not on file  Social History Narrative   Lives alone, widow   Caffeine use- tea, 1 cup daily   Right Handed    Lives in a one story [place] home    Social Determinants of Health   Financial Resource Strain: Low Risk  (02/17/2021)   Overall Financial Resource Strain (CARDIA)    Difficulty of Paying Living Expenses: Not hard at all  Food Insecurity: Not on file  Transportation Needs: No Transportation Needs (02/17/2021)   PRAPARE - Hydrologist (Medical):  No    Lack of Transportation (Non-Medical): No  Physical Activity: Not on file  Stress: Not on file  Social Connections: Not on file  Intimate Partner Violence: Not on file    Outpatient Medications Prior to Visit  Medication Sig Dispense Refill   albuterol (VENTOLIN HFA) 108 (90 Base) MCG/ACT inhaler INHALE 1 TO 2 PUFFS BY MOUTH EVERY 6 HOURS AS NEEDED FOR WHEEZING FOR SHORTNESS OF BREATH 18 g 0   amitriptyline (ELAVIL) 75 MG tablet Take 75 mg by mouth at bedtime.     amoxicillin-clavulanate (AUGMENTIN) 875-125 MG tablet Take 1 tablet by mouth 2 (two) times daily. 20 tablet 0   cetirizine (ZYRTEC) 10 MG tablet Take 1 tablet (10 mg total) by mouth daily. 30 tablet 11   cyanocobalamin (VITAMIN B12) 1000 MCG/ML injection INJECT 1ML INTO THE MUSCLE EVERY 14 DAYS 6 mL 0   Fluticasone-Umeclidin-Vilant (TRELEGY ELLIPTA) 200-62.5-25 MCG/ACT AEPB Inhale 1 puff into the lungs daily. 3 each 3   gabapentin (NEURONTIN) 300 MG capsule TAKE 2 CAPSULES THREE TIMES DAILY 180 capsule 2   HYDROcodone-acetaminophen (NORCO/VICODIN) 5-325 MG tablet Take 1 tablet by mouth daily as needed for severe pain (migraines). 30 tablet 0   megestrol (MEGACE) 20 MG tablet Take 2 tablets (40 mg total) by mouth daily. 180 tablet 1   melatonin 10 MG TABS Take 20 mg by  mouth at bedtime. 1 tablet 0   meloxicam (MOBIC) 15 MG tablet TAKE 1 TABLET EVERY DAY 90 tablet 1   midodrine (PROAMATINE) 2.5 MG tablet Take 1 tablet (2.5 mg total) by mouth 3 (three) times daily with meals. 270 tablet 0   mirtazapine (REMERON) 45 MG tablet Take 1 tablet (45 mg total) by mouth at bedtime. 90 tablet 1   NICOTINE STEP 1 21 MG/24HR patch PLACE ONE PATCH onto THE SKIN DAILY 30 patch 0   omeprazole (PRILOSEC) 40 MG capsule Take 1 capsule (40 mg total) by mouth 2 (two) times daily. 180 capsule 1   orphenadrine (NORFLEX) 100 MG tablet Take 100 mg by mouth 2 (two) times daily as needed.     predniSONE (DELTASONE) 50 MG tablet Take 1 tablet (50 mg total) by mouth daily with breakfast. 5 tablet 0   QUEtiapine (SEROQUEL) 50 MG tablet Take 1 tablet (50 mg total) by mouth at bedtime. 90 tablet 0   rizatriptan (MAXALT-MLT) 10 MG disintegrating tablet DISSOLVE 1 TABLET ON THE TONGUE AT ONSET OF MIGRAINE, REPEAT IN 2 HOURS IF NEEDED 10 tablet 2   sertraline (ZOLOFT) 50 MG tablet Take 1 tablet (50 mg total) by mouth daily. 90 tablet 1   traZODone (DESYREL) 100 MG tablet TAKE 1 TABLET AT BEDTIME AS NEEDED FOR SLEEP 90 tablet 1   No facility-administered medications prior to visit.    Allergies  Allergen Reactions   Clindamycin/Lincomycin Nausea And Vomiting   Lyrica [Pregabalin] Other (See Comments)    PERSONALITY CHANGE    Review of Systems  Constitutional:  Positive for fatigue. Negative for chills and fever.  HENT:  Negative for congestion, ear pain and sore throat.   Respiratory:  Negative for cough and shortness of breath.   Cardiovascular:  Negative for chest pain.  Neurological:  Positive for dizziness.       Objective:    Physical Exam  BP (!) 84/60   Pulse 62   Temp (!) 97.5 F (36.4 C)   Resp 16   Ht 5' 1"  (1.549 m)   Wt 84 lb (38.1 kg)  SpO2 99%   BMI 15.87 kg/m  Wt Readings from Last 3 Encounters:  11/23/21 84 lb (38.1 kg)  11/16/21 84 lb (38.1 kg)   10/19/21 81 lb (36.7 kg)    Health Maintenance Due  Topic Date Due   Hepatitis C Screening  Never done   Zoster Vaccines- Shingrix (1 of 2) Never done   COVID-19 Vaccine (3 - Moderna risk series) 03/29/2020   INFLUENZA VACCINE  10/03/2021    There are no preventive care reminders to display for this patient.   Lab Results  Component Value Date   TSH 1.220 07/11/2021   Lab Results  Component Value Date   WBC 7.8 10/19/2021   HGB 11.4 10/19/2021   HCT 36.4 10/19/2021   MCV 98 (H) 10/19/2021   PLT 254 10/19/2021   Lab Results  Component Value Date   NA 141 10/19/2021   K 4.8 10/19/2021   CO2 14 (L) 10/19/2021   GLUCOSE 68 (L) 10/19/2021   BUN 23 10/19/2021   CREATININE 0.90 10/19/2021   BILITOT <0.2 10/19/2021   ALKPHOS 86 10/19/2021   AST 23 10/19/2021   ALT 11 10/19/2021   PROT 6.7 10/19/2021   ALBUMIN 4.0 10/19/2021   CALCIUM 9.3 10/19/2021   ANIONGAP 3 (L) 07/03/2016   EGFR 67 10/19/2021   Lab Results  Component Value Date   CHOL 132 01/30/2021   Lab Results  Component Value Date   HDL 64 01/30/2021   Lab Results  Component Value Date   LDLCALC 45 01/30/2021   Lab Results  Component Value Date   TRIG 133 01/30/2021   Lab Results  Component Value Date   CHOLHDL 2.1 01/30/2021   No results found for: "HGBA1C"     Assessment & Plan:   Problem List Items Addressed This Visit   None Visit Diagnoses     Vitamin B12 deficiency    -  Primary   Relevant Medications   cyanocobalamin (VITAMIN B12) injection 1,000 mcg (Completed)      Meds ordered this encounter  Medications   cyanocobalamin (VITAMIN B12) injection 1,000 mcg    No orders of the defined types were placed in this encounter.    Follow-up: No follow-ups on file.  An After Visit Summary was printed and given to the patient.  [name], MD [facility] [phone]

## 2021-11-23 NOTE — Progress Notes (Signed)
Subjective:  Patient ID: Anne [name], female    DOB: [birth year]  Age: 76 y.o. MRN: [id number]  Chief Complaint  Patient presents with   Dehydration   Wheezing    HPI  Patient is here for wheezing, cough, SOB and her saliva is very thick since 4 days ago. She thinks is dehydrated. Patient was treated for copd exac with augmentin and prednisone last week. Oxygen sat 99%.   Patient has had orthostatic hypotension and I started her on midodrine 2.5 mg three times a day.   Current Outpatient Medications on File Prior to Visit  Medication Sig Dispense Refill   albuterol (VENTOLIN HFA) 108 (90 Base) MCG/ACT inhaler INHALE 1 TO 2 PUFFS BY MOUTH EVERY 6 HOURS AS NEEDED FOR WHEEZING FOR SHORTNESS OF BREATH 18 g 0   amitriptyline (ELAVIL) 75 MG tablet Take 75 mg by mouth at bedtime.     amoxicillin-clavulanate (AUGMENTIN) 875-125 MG tablet Take 1 tablet by mouth 2 (two) times daily. 20 tablet 0   cetirizine (ZYRTEC) 10 MG tablet Take 1 tablet (10 mg total) by mouth daily. 30 tablet 11   cyanocobalamin (VITAMIN B12) 1000 MCG/ML injection INJECT 1ML INTO THE MUSCLE EVERY 14 DAYS 6 mL 0   Fluticasone-Umeclidin-Vilant (TRELEGY ELLIPTA) 200-62.5-25 MCG/ACT AEPB Inhale 1 puff into the lungs daily. 3 each 3   gabapentin (NEURONTIN) 300 MG capsule TAKE 2 CAPSULES THREE TIMES DAILY 180 capsule 2   HYDROcodone-acetaminophen (NORCO/VICODIN) 5-325 MG tablet Take 1 tablet by mouth daily as needed for severe pain (migraines). 30 tablet 0   megestrol (MEGACE) 20 MG tablet Take 2 tablets (40 mg total) by mouth daily. 180 tablet 1   melatonin 10 MG TABS Take 20 mg by mouth at bedtime. 1 tablet 0   meloxicam (MOBIC) 15 MG tablet TAKE 1 TABLET EVERY DAY 90 tablet 1   midodrine (PROAMATINE) 2.5 MG tablet Take 1 tablet (2.5 mg total) by mouth 3 (three) times daily with meals. 270 tablet 0   mirtazapine (REMERON) 45 MG tablet Take 1 tablet (45 mg total) by mouth at bedtime. 90 tablet 1   NICOTINE STEP 1 21 MG/24HR  patch PLACE ONE PATCH onto THE SKIN DAILY 30 patch 0   omeprazole (PRILOSEC) 40 MG capsule Take 1 capsule (40 mg total) by mouth 2 (two) times daily. 180 capsule 1   orphenadrine (NORFLEX) 100 MG tablet Take 100 mg by mouth 2 (two) times daily as needed.     QUEtiapine (SEROQUEL) 50 MG tablet Take 1 tablet (50 mg total) by mouth at bedtime. 90 tablet 0   rizatriptan (MAXALT-MLT) 10 MG disintegrating tablet DISSOLVE 1 TABLET ON THE TONGUE AT ONSET OF MIGRAINE, REPEAT IN 2 HOURS IF NEEDED 10 tablet 2   sertraline (ZOLOFT) 50 MG tablet Take 1 tablet (50 mg total) by mouth daily. 90 tablet 1   traZODone (DESYREL) 100 MG tablet TAKE 1 TABLET AT BEDTIME AS NEEDED FOR SLEEP 90 tablet 1   No current facility-administered medications on file prior to visit.   Past Medical History:  Diagnosis Date   Acquired absence of both breasts and nipples 06/08/2015   Acute infection of nasal sinus 05/04/2021   Aortic atherosclerosis 01/30/2021   Asthma    daily inhalers   Ataxia 10/18/2020   Bilateral carpal tunnel syndrome 01/30/2021   Bipolar disorder, mixed 06/16/2021   Breast cancer of upper-inner quadrant of left female breast 04/15/2015   Carotid artery disease 05/04/2016   Mild, 1-39% bilaterally by doppler  [name] 2018   Chronic pain in right shoulder 12/25/2020   COPD with acute exacerbation 07/03/2016   Dental crowns present    Drug induced constipation 04/10/2021   EIC (epidermal inclusion cyst) 03/21/2021   Family history of breast cancer in female 04/26/2015   Dx. Niece at 33; dx. Maternal aunt in her late 14s  Formatting of this note might be different from the original. Overview:  Dx. Niece at 71; dx. Maternal aunt in her late 76s   Family history of colon cancer 04/26/2015   Dx. In maternal grandfather in his late 59s-70  Formatting of this note might be different from the original. Overview:  Dx. In maternal grandfather in his late 58s-70   Frequent falls 10/18/2020   Genetic testing  06/02/2015   Negative for pathogenic mutations within any of 20 genes on the Breast/Ovarian Cancer Panel through [employer].  No variants of uncertain significance (VUSes) were found.  The Breast/Ovarian Cancer Panel offered by [facility] Hope Pigeon, MD) includes sequencing and deletion/duplication analysis for the following 19 genes:  ATM, BARD1, BRCA1, BRCA2, BRIP1, CDH1, CHEK2, FANCC, M   History of basal cell carcinoma 04/26/2015   History of concussion 01/01/2014   History of kidney stones    History of TIA (transient ischemic attack) 10/2014   Hyperlipidemia    Insomnia    Lumbar back pain 10/18/2020   Major depressive disorder 10/18/2020   Malrotation of intestine 09/23/2017   Migraine aura without headache 02/08/2014   Migraine headache 01/30/2021   Nasal congestion 05/26/2015   will finish z-pak 05/27/2015   Nonproductive cough 05/26/2015   PONV (postoperative nausea and vomiting)    "long time ago"  none recently   Psychogenic syncope 04/23/2019   Right lumbar radiculopathy 04/23/2019   RLS (restless legs syndrome)    Simple chronic bronchitis 07/04/2019   Tobacco use    Tremor 10/18/2020   Vitamin D deficiency    Wears dentures    Weight loss 08/28/2019   Past Surgical History:  Procedure Laterality Date   ABDOMINAL HYSTERECTOMY     APPENDECTOMY  2000   BREAST BIOPSY Left    BREAST RECONSTRUCTION WITH PLACEMENT OF TISSUE EXPANDER AND FLEX HD (ACELLULAR HYDRATED DERMIS) Bilateral 06/02/2015   Procedure: BILATERAL BREAST RECONSTRUCTION WITH PLACEMENT OF TISSUE EXPANDER AND  ACELLULAR HYDRATED DERMIS;  Surgeon: Irene Limbo, MD;  Location: [place];  Service: Plastics;  Laterality: Bilateral;   CARPAL TUNNEL RELEASE Bilateral    CATARACT EXTRACTION Bilateral 09/2013   KNEE ARTHROSCOPY Left    LAPAROSCOPIC ABDOMINAL EXPLORATION N/A 10/2017   LAPAROSCOPIC LYSIS OF CONGENITAL ADHESIVE BAND/LADD'S BANDS WIH INTRAOPERATIVE  CHOLANGIOGRAPHY. Perforation of small intestion requiring repair.    LOOP RECORDER INSERTION N/A 05/11/2016   Procedure: Loop Recorder Insertion;  Surgeon: Will Meredith Leeds, MD;  Location: [facility];  Service: Cardiovascular;  Laterality: N/A;   MASTECTOMY W/ SENTINEL NODE BIOPSY Bilateral 06/02/2015   Procedure: BILATERAL MASTECTOMY WITH LEFT SENTINEL LYMPH NODE BIOPSY;  Surgeon: Stark Klein, MD;  Location: [place];  Service: General;  Laterality: Bilateral;   NASAL SEPTUM SURGERY     x 3   PLANTAR FASCIA SURGERY Left    REMOVAL OF BILATERAL TISSUE EXPANDERS WITH PLACEMENT OF BILATERAL BREAST IMPLANTS Bilateral 06/17/2015   Procedure: DEBRIDEMENT OF BILATERAL MASTECTOMY FLAP WITH BILATERAL TISSUE EXPANDER EXCHANGE ;  Surgeon: Irene Limbo, MD;  Location: [place];  Service: Plastics;  Laterality: Bilateral;   REMOVAL OF TISSUE EXPANDER  Bilateral 07/12/2015   Procedure: REMOVAL OF BILATERAL TISSUE EXPANDERS;  Surgeon: Irene Limbo, MD;  Location: [place];  Service: Plastics;  Laterality: Bilateral;    Family History  Problem Relation Age of Onset   Cervical cancer Mother 76   Colon cancer Maternal Grandfather        mets to stomach; dx. 67-70   Heart disease Father    Prostate cancer Father 7   Cervical cancer Maternal Grandmother        dx. 55s; treated with radium implant   Lung cancer Sister 23       maternal half-sister dx. lung cancer, stage III   Breast cancer Maternal Aunt    Cervical cancer Sister 65       paternal half-sister; s/p TAH   Spina bifida Grandchild    Brain cancer Grandchild        grandson dx. at 20 mos, treated at [place] cancer Other 50       maternal half-sister's daughter   Cancer Maternal Aunt        d. 83; unspecified type - "began at back area and moved to vital organs"   Cancer Maternal Uncle         late 56s; unspecified type; "started at back and moved to vital organs"   Social History    Socioeconomic History   Marital status: Widowed    Spouse name: Not on file   Number of children: 1   Years of education: 73   Highest education level: Not on file  Occupational History    Comment: [employer], retired  Tobacco Use   Smoking status: Every Day    Packs/day: 0.15    Types: Cigarettes    Last attempt to quit: 03/21/2020    Years since quitting: 1.6   Smokeless tobacco: Never  Vaping Use   Vaping Use: Never used  Substance and Sexual Activity   Alcohol use: Yes    Alcohol/week: 0.0 standard drinks of alcohol    Comment: rarely   Drug use: No   Sexual activity: Not on file  Other Topics Concern   Not on file  Social History Narrative   Lives alone, widow   Caffeine use- tea, 1 cup daily   Right Handed    Lives in a one story [place] home    Social Determinants of Health   Financial Resource Strain: Low Risk  (02/17/2021)   Overall Financial Resource Strain (CARDIA)    Difficulty of Paying Living Expenses: Not hard at all  Food Insecurity: Not on file  Transportation Needs: No Transportation Needs (02/17/2021)   PRAPARE - Hydrologist (Medical): No    Lack of Transportation (Non-Medical): No  Physical Activity: Not on file  Stress: Not on file  Social Connections: Not on file    Review of Systems  Constitutional:  Negative for chills, fatigue and fever.  HENT:  Negative for congestion, ear pain and sore throat.   Respiratory:  Positive for cough, shortness of breath and wheezing.   Cardiovascular:  Negative for chest pain and palpitations.  Gastrointestinal:  Negative for abdominal pain, constipation, nausea and vomiting.  Endocrine: Negative for polydipsia, polyphagia and polyuria.  Genitourinary:  Negative for difficulty urinating and dysuria.  Musculoskeletal:  Negative for arthralgias, back pain and myalgias.  Skin:  Negative for rash.  Neurological:  Positive for dizziness. Negative for headaches.   Psychiatric/Behavioral:  Negative for dysphoric mood. The  patient is not nervous/anxious.      Objective:  BP (!) 84/60   Pulse 62   Temp (!) 97.5 F (36.4 C)   Resp 16   Ht 5' 1"  (1.549 m)   Wt 84 lb (38.1 kg)   SpO2 99%   BMI 15.87 kg/m      11/23/2021    3:39 PM 11/16/2021   10:46 AM 10/24/2021    2:19 PM  BP/Weight  Systolic BP 84 373 72  Diastolic BP 60 70 40  Wt. (Lbs) 84 84   BMI 15.87 kg/m2 15.87 kg/m2     Physical Exam Vitals reviewed.  Constitutional:      Comments: Thin   HENT:     Right Ear: Tympanic membrane, ear canal and external ear normal.     Left Ear: Tympanic membrane, ear canal and external ear normal.     Nose: Nose normal.     Mouth/Throat:     Mouth: Mucous membranes are dry.  Cardiovascular:     Rate and Rhythm: Normal rate and regular rhythm.     Heart sounds: Normal heart sounds. No murmur heard. Pulmonary:     Effort: Pulmonary effort is normal. No respiratory distress.     Breath sounds: Wheezing present.  Neurological:     Mental Status: She is alert and oriented to person, place, and time.  Psychiatric:        Mood and Affect: Mood normal.        Behavior: Behavior normal.     Diabetic Foot Exam - Simple   No data filed      Lab Results  Component Value Date   WBC 7.8 10/19/2021   HGB 11.4 10/19/2021   HCT 36.4 10/19/2021   PLT 254 10/19/2021   GLUCOSE 68 (L) 10/19/2021   CHOL 132 01/30/2021   TRIG 133 01/30/2021   HDL 64 01/30/2021   LDLCALC 45 01/30/2021   ALT 11 10/19/2021   AST 23 10/19/2021   NA 141 10/19/2021   K 4.8 10/19/2021   CL 107 (H) 10/19/2021   CREATININE 0.90 10/19/2021   BUN 23 10/19/2021   CO2 14 (L) 10/19/2021   TSH 1.220 07/11/2021      Assessment & Plan:   Problem List Items Addressed This Visit       Respiratory   COPD exacerbation ([place])    Rx; zpack. Complete augmentin.  Duoneb treatments four times a day x at least 48 hours than four times a day as needed.  Prednisone taper  prescribed.       Relevant Medications   predniSONE (DELTASONE) 20 MG tablet   azithromycin (ZITHROMAX) 250 MG tablet   ipratropium-albuterol (DUONEB) 0.5-2.5 (3) MG/3ML nebulizer solution 3 mL (Start on 11/23/2021  4:15 PM)   Other Relevant Orders   CBC with Differential/Platelet     Other   Dehydration    Push fluids.  Scheduling iv fluids 1 L Normal Saline with potassium chloride 20 meq.      Relevant Orders   POCT URINALYSIS DIP (CLINITEK) (Completed)   CBC with Differential/Platelet   Comprehensive metabolic panel   Vitamin S28 deficiency - Primary    b12 shot given.      .  Meds ordered this encounter  Medications   cyanocobalamin (VITAMIN B12) injection 1,000 mcg   predniSONE (DELTASONE) 20 MG tablet    Sig: Take 3 tablets (60 mg total) by mouth daily with breakfast for 3 days, THEN 2 tablets (40 mg total)  daily with breakfast for 3 days, THEN 1 tablet (20 mg total) daily with breakfast for 3 days.    Dispense:  18 tablet    Refill:  0   azithromycin (ZITHROMAX) 250 MG tablet    Sig: 2 DAILY FOR FIRST DAY, THEN DECREASE TO ONE DAILY FOR 4 MORE DAYS.    Dispense:  6 tablet    Refill:  0   ipratropium-albuterol (DUONEB) 0.5-2.5 (3) MG/3ML nebulizer solution 3 mL    Orders Placed This Encounter  Procedures   CBC with Differential/Platelet   Comprehensive metabolic panel   POCT URINALYSIS DIP (CLINITEK)     Follow-up: No follow-ups on file.  An After Visit Summary was printed and given to the patient.  [name], MD [facility] [phone]

## 2021-11-23 NOTE — Assessment & Plan Note (Signed)
Push fluids.  Scheduling iv fluids 1 L Normal Saline with potassium chloride 20 meq.

## 2021-11-23 NOTE — Assessment & Plan Note (Signed)
b12 shot given

## 2021-11-23 NOTE — Assessment & Plan Note (Signed)
Rx; zpack. Complete augmentin.  Duoneb treatments four times a day x at least 48 hours than four times a day as needed.  Prednisone taper prescribed.

## 2021-11-24 ENCOUNTER — Telehealth: Payer: Self-pay

## 2021-11-24 ENCOUNTER — Other Ambulatory Visit: Payer: Self-pay

## 2021-11-24 DIAGNOSIS — E860 Dehydration: Secondary | ICD-10-CM | POA: Diagnosis not present

## 2021-11-24 NOTE — Chronic Care Management (AMB) (Unsigned)
Chronic Care Management Pharmacy Assistant   Name: Anne [name]  MRN: [id number] DOB: [birth year]   Reason for Encounter: Medication Review/ Medication coordination   Recent office visits:  11-23-2021 [name], MD. WBC= 14.4, Neutrophils Absolute= 10.5, Monocytes Absolute= 1.3, Immature Grans (Abs)= 0.2. BUN/Creatinine Ratio= 33. Abnormal UA. START Zithromax 250 mg 2 DAILY FOR FIRST DAY, THEN DECREASE TO ONE DAILY FOR 4 MORE DAYS. B12 injection given. B12 given.  11-16-2021 [name], MD. START augmentin 875-125 mg twice daily. Prednisone 50 mg daily. INCREASE Zoloft 25 mg daily TO 50 mg daily. STOP tamoxifen. Mammogram ordered. B12 given.   11-02-2021 [name], CMA. B12 given.  10-24-2021 Effie Shy, RN. B12 given.  10-19-2021 Cox, Elnita Maxwell, MD. RBC= 3.70, MCV= 98, MCHC= 31.3. Glucose= 68, Chloride= 107, CO2= 14. START midodrine 2.5 mg 3 times daily.  10-04-2021 [name], MD. WBC= 12.4, RBC= 3.52, Hemo= 11.0, Neutrophils Absolute= 9.2, Monocytes Absolute= 1.0.   Recent consult visits:  None  Hospital visits:  None in previous 6 months  Medications: Outpatient Encounter Medications as of 11/24/2021  Medication Sig   albuterol (VENTOLIN HFA) 108 (90 Base) MCG/ACT inhaler INHALE 1 TO 2 PUFFS BY MOUTH EVERY 6 HOURS AS NEEDED FOR WHEEZING FOR SHORTNESS OF BREATH   amitriptyline (ELAVIL) 75 MG tablet Take 75 mg by mouth at bedtime.   amoxicillin-clavulanate (AUGMENTIN) 875-125 MG tablet Take 1 tablet by mouth 2 (two) times daily.   azithromycin (ZITHROMAX) 250 MG tablet 2 DAILY FOR FIRST DAY, THEN DECREASE TO ONE DAILY FOR 4 MORE DAYS.   cetirizine (ZYRTEC) 10 MG tablet Take 1 tablet (10 mg total) by mouth daily.   cyanocobalamin (VITAMIN B12) 1000 MCG/ML injection INJECT 1ML INTO THE MUSCLE EVERY 14 DAYS   Fluticasone-Umeclidin-Vilant (TRELEGY ELLIPTA) 200-62.5-25 MCG/ACT AEPB Inhale 1 puff into the lungs daily.   gabapentin (NEURONTIN) 300 MG capsule  TAKE 2 CAPSULES THREE TIMES DAILY   HYDROcodone-acetaminophen (NORCO/VICODIN) 5-325 MG tablet Take 1 tablet by mouth daily as needed for severe pain (migraines).   megestrol (MEGACE) 20 MG tablet Take 2 tablets (40 mg total) by mouth daily.   melatonin 10 MG TABS Take 20 mg by mouth at bedtime.   meloxicam (MOBIC) 15 MG tablet TAKE 1 TABLET EVERY DAY   midodrine (PROAMATINE) 2.5 MG tablet Take 1 tablet (2.5 mg total) by mouth 3 (three) times daily with meals.   mirtazapine (REMERON) 45 MG tablet Take 1 tablet (45 mg total) by mouth at bedtime.   NICOTINE STEP 1 21 MG/24HR patch PLACE ONE PATCH onto THE SKIN DAILY   omeprazole (PRILOSEC) 40 MG capsule Take 1 capsule (40 mg total) by mouth 2 (two) times daily.   orphenadrine (NORFLEX) 100 MG tablet Take 100 mg by mouth 2 (two) times daily as needed.   predniSONE (DELTASONE) 20 MG tablet Take 3 tablets (60 mg total) by mouth daily with breakfast for 3 days, THEN 2 tablets (40 mg total) daily with breakfast for 3 days, THEN 1 tablet (20 mg total) daily with breakfast for 3 days.   QUEtiapine (SEROQUEL) 50 MG tablet Take 1 tablet (50 mg total) by mouth at bedtime.   rizatriptan (MAXALT-MLT) 10 MG disintegrating tablet DISSOLVE 1 TABLET ON THE TONGUE AT ONSET OF MIGRAINE, REPEAT IN 2 HOURS IF NEEDED   sertraline (ZOLOFT) 50 MG tablet Take 1 tablet (50 mg total) by mouth daily.   traZODone (DESYREL) 100 MG tablet TAKE 1 TABLET AT BEDTIME AS NEEDED FOR SLEEP  Facility-Administered Encounter Medications as of 11/24/2021  Medication   ipratropium-albuterol (DUONEB) 0.5-2.5 (3) MG/3ML nebulizer solution 3 mL  Reviewed chart for medication changes ahead of medication coordination call.  No medication changes indicated   BP Readings from Last 3 Encounters:  11/23/21 (!) 84/60  11/16/21 130/70  10/24/21 (!) 72/40    No results found for: "HGBA1C"   Patient obtains medications through Adherence Packaging  90 Days   Last adherence delivery included:  None patient's first delivery  Patient declined (meds) last month due: None patient's first delivery  Patient is due for next adherence delivery on: 12-06-2021  Called patient and reviewed medications and coordinated delivery.  This delivery to include: Cetirizine 10 mg- 1 tablet at breakfast  Trelegy Gabapentin 300 mg- 2 at breakfast, 2 at lunch, 2 at bedtime Tamoxifen 20 mg- 1 tablet at bedtime Quetiapine 100 mg- 0.5 mg at bedtime Megestrol Ac 20 mg- 2 tablets before breakfast  Sertraline 50 mg- 1 tablet at breakfast  Trazodone 100 mg- 1 at bedtime Omeprazole 40 mg- 1 at breakfast and 1 with evening meal Meloxicam 15 mg- 1 at breakfast Mirtazapine 45 mg- 1 at breakfast Patient will need a short fill of (med), prior to adherence delivery. (To align with sync date or if PRN med)  Coordinated acute fill for (med) to be delivered (date).  Patient declined the following medications (meds) due to (reason)  Patient needs refills for: Sent to PCP Tamoxifen  Confirmed delivery date of ***, advised patient that pharmacy will contact them the morning of delivery.  11-24-2021: 1st attempt left VM  Care Gaps: Hep C screening overdue Shingrix overdue Flu vaccine overdue Covod booster overdue  Star Rating Drugs: None  Jeannette How [facility] Clinical Pharmacist Assistant [phone]

## 2021-11-26 ENCOUNTER — Encounter: Payer: Self-pay | Admitting: Family Medicine

## 2021-11-26 DIAGNOSIS — R202 Paresthesia of skin: Secondary | ICD-10-CM | POA: Insufficient documentation

## 2021-11-26 NOTE — Assessment & Plan Note (Signed)
Order a mammogram

## 2021-11-26 NOTE — Assessment & Plan Note (Signed)
B12 shot given.  

## 2021-11-26 NOTE — Assessment & Plan Note (Signed)
Likely due to b12 deficiency.

## 2021-11-26 NOTE — Assessment & Plan Note (Signed)
Rx: augmentin, prednisone.  Given rocephin 1 gm IM and kenalog 80 mg IM.

## 2021-11-26 NOTE — Assessment & Plan Note (Signed)
Improved on midodrine 2.5 mg three times a day.

## 2021-11-27 ENCOUNTER — Other Ambulatory Visit: Payer: Self-pay | Admitting: Family Medicine

## 2021-11-27 ENCOUNTER — Encounter: Payer: Self-pay | Admitting: Family Medicine

## 2021-11-27 MED ORDER — ORPHENADRINE CITRATE ER 100 MG PO TB12: 100.0000 mg | ORAL_TABLET | Freq: Two times a day (BID) | ORAL | 0 refills | Status: DC | PRN

## 2021-11-27 MED ORDER — MIDODRINE HCL 2.5 MG PO TABS: 2.5000 mg | ORAL_TABLET | Freq: Three times a day (TID) | ORAL | 1 refills | Status: DC

## 2021-11-27 MED ORDER — AMITRIPTYLINE HCL 75 MG PO TABS: 75.0000 mg | ORAL_TABLET | Freq: Every day | ORAL | 1 refills | Status: DC

## 2021-11-28 NOTE — Telephone Encounter (Signed)
Compliant on meds 

## 2021-11-29 ENCOUNTER — Other Ambulatory Visit: Payer: Self-pay | Admitting: Family Medicine

## 2021-11-30 ENCOUNTER — Ambulatory Visit (INDEPENDENT_AMBULATORY_CARE_PROVIDER_SITE_OTHER): Payer: Medicare HMO | Admitting: Family Medicine

## 2021-11-30 VITALS — BP 100/68 | HR 106 | Temp 97.9°F

## 2021-11-30 DIAGNOSIS — U071 COVID-19: Secondary | ICD-10-CM | POA: Diagnosis not present

## 2021-11-30 DIAGNOSIS — J22 Unspecified acute lower respiratory infection: Secondary | ICD-10-CM | POA: Diagnosis not present

## 2021-11-30 LAB — POC COVID19 BINAXNOW: SARS Coronavirus 2 Ag: POSITIVE — AB

## 2021-11-30 MED ORDER — NIRMATRELVIR/RITONAVIR (PAXLOVID)TABLET: 3.0000 | ORAL_TABLET | Freq: Two times a day (BID) | ORAL | 0 refills | Status: AC

## 2021-11-30 NOTE — Progress Notes (Unsigned)
Acute Office Visit  Subjective:    Patient ID: Anne [name], female    DOB: 10/14/1945, 76 y.o.   MRN: [id number]  Chief Complaint  Patient presents with   Covid Positive    HPI: Patient is in today for for cough,congestion,fever, and headache. Patient tested positive today in office for Covid 19.   Past Medical History:  Diagnosis Date   Acquired absence of both breasts and nipples 06/08/2015   Acute infection of nasal sinus 05/04/2021   Aortic atherosclerosis 01/30/2021   Asthma    daily inhalers   Ataxia 10/18/2020   Bilateral carpal tunnel syndrome 01/30/2021   Bipolar disorder, mixed 06/16/2021   Breast cancer of upper-inner quadrant of left female breast 04/15/2015   Carotid artery disease 05/04/2016   Mild, 1-39% bilaterally by doppler in March 2018   Chronic pain in right shoulder 12/25/2020   COPD with acute exacerbation 07/03/2016   Dental crowns present    Drug induced constipation 04/10/2021   EIC (epidermal inclusion cyst) 03/21/2021   Family history of breast cancer in female 04/26/2015   Dx. Niece at 76; dx. Maternal aunt in her late 74s  Formatting of this note might be different from the original. Overview:  Dx. Niece at 60; dx. Maternal aunt in her late 96s   Family history of colon cancer 04/26/2015   Dx. In maternal grandfather in his late 41s-70  Formatting of this note might be different from the original. Overview:  Dx. In maternal grandfather in his late 4s-70   Frequent falls 10/18/2020   Genetic testing 06/02/2015   Negative for pathogenic mutations within any of 20 genes on the Breast/Ovarian Cancer Panel through [employer].  No variants of uncertain significance (VUSes) were found.  The Breast/Ovarian Cancer Panel offered by [facility] Hope Pigeon, MD) includes sequencing and deletion/duplication analysis for the following 19 genes:  ATM, BARD1, BRCA1, BRCA2, BRIP1, CDH1, CHEK2, FANCC, M   History of basal cell carcinoma  04/26/2015   History of concussion 01/01/2014   History of kidney stones    History of TIA (transient ischemic attack) 10/2014   Hyperlipidemia    Insomnia    Lumbar back pain 10/18/2020   Major depressive disorder 10/18/2020   Malrotation of intestine 09/23/2017   Migraine aura without headache 02/08/2014   Migraine headache 01/30/2021   Nasal congestion 05/26/2015   will finish z-pak 05/27/2015   Nonproductive cough 05/26/2015   PONV (postoperative nausea and vomiting)    "long time ago"  none recently   Psychogenic syncope 04/23/2019   Right lumbar radiculopathy 04/23/2019   RLS (restless legs syndrome)    Simple chronic bronchitis 07/04/2019   Tobacco use    Tremor 10/18/2020   Vitamin D deficiency    Wears dentures    Weight loss 08/28/2019    Past Surgical History:  Procedure Laterality Date   ABDOMINAL HYSTERECTOMY     APPENDECTOMY  2000   BREAST BIOPSY Left    BREAST RECONSTRUCTION WITH PLACEMENT OF TISSUE EXPANDER AND FLEX HD (ACELLULAR HYDRATED DERMIS) Bilateral 06/02/2015   Procedure: BILATERAL BREAST RECONSTRUCTION WITH PLACEMENT OF TISSUE EXPANDER AND  ACELLULAR HYDRATED DERMIS;  Surgeon: Irene Limbo, MD;  Location: [place];  Service: Plastics;  Laterality: Bilateral;   CARPAL TUNNEL RELEASE Bilateral    CATARACT EXTRACTION Bilateral 09/2013   KNEE ARTHROSCOPY Left    LAPAROSCOPIC ABDOMINAL EXPLORATION N/A 10/2017   LAPAROSCOPIC LYSIS OF CONGENITAL ADHESIVE BAND/LADD'S BANDS WIH INTRAOPERATIVE CHOLANGIOGRAPHY. Perforation of small  intestion requiring repair.    LOOP RECORDER INSERTION N/A 05/11/2016   Procedure: Loop Recorder Insertion;  Surgeon: Will Meredith Leeds, MD;  Location: [facility];  Service: Cardiovascular;  Laterality: N/A;   MASTECTOMY W/ SENTINEL NODE BIOPSY Bilateral 06/02/2015   Procedure: BILATERAL MASTECTOMY WITH LEFT SENTINEL LYMPH NODE BIOPSY;  Surgeon: Stark Klein, MD;  Location: [place];   Service: General;  Laterality: Bilateral;   NASAL SEPTUM SURGERY     x 3   PLANTAR FASCIA SURGERY Left    REMOVAL OF BILATERAL TISSUE EXPANDERS WITH PLACEMENT OF BILATERAL BREAST IMPLANTS Bilateral 06/17/2015   Procedure: DEBRIDEMENT OF BILATERAL MASTECTOMY FLAP WITH BILATERAL TISSUE EXPANDER EXCHANGE ;  Surgeon: Irene Limbo, MD;  Location: [place];  Service: Plastics;  Laterality: Bilateral;   REMOVAL OF TISSUE EXPANDER Bilateral 07/12/2015   Procedure: REMOVAL OF BILATERAL TISSUE EXPANDERS;  Surgeon: Irene Limbo, MD;  Location: [place];  Service: Plastics;  Laterality: Bilateral;    Family History  Problem Relation Age of Onset   Cervical cancer Mother 28   Colon cancer Maternal Grandfather        mets to stomach; dx. 67-70   Heart disease Father    Prostate cancer Father 52   Cervical cancer Maternal Grandmother        dx. 4s; treated with radium implant   Lung cancer Sister 66       maternal half-sister dx. lung cancer, stage III   Breast cancer Maternal Aunt    Cervical cancer Sister 39       paternal half-sister; s/p TAH   Spina bifida Grandchild    Brain cancer Grandchild        grandson dx. at 20 mos, treated at [facility] Other 14       maternal half-sister's daughter   Cancer Maternal Aunt        d. 37; unspecified type - "began at back area and moved to vital organs"   Cancer Maternal Uncle         late 13s; unspecified type; "started at back and moved to vital organs"    Social History   Socioeconomic History   Marital status: Widowed    Spouse name: Not on file   Number of children: 1   Years of education: 55   Highest education level: Not on file  Occupational History    Comment: [employer], retired  Tobacco Use   Smoking status: Every Day    Packs/day: 0.15    Types: Cigarettes    Last attempt to quit: 03/21/2020    Years since quitting: 1.6   Smokeless tobacco: Never  Vaping Use   Vaping Use: Never used   Substance and Sexual Activity   Alcohol use: Yes    Alcohol/week: 0.0 standard drinks of alcohol    Comment: rarely   Drug use: No   Sexual activity: Not on file  Other Topics Concern   Not on file  Social History Narrative   Lives alone, widow   Caffeine use- tea, 1 cup daily   Right Handed    Lives in a one story [place] home    Social Determinants of Health   Financial Resource Strain: Low Risk  (02/17/2021)   Overall Financial Resource Strain (CARDIA)    Difficulty of Paying Living Expenses: Not hard at all  Food Insecurity: Not on file  Transportation Needs: No Transportation Needs (02/17/2021)   [place] - Transportation  Lack of Transportation (Medical): No    Lack of Transportation (Non-Medical): No  Physical Activity: Not on file  Stress: Not on file  Social Connections: Not on file  Intimate Partner Violence: Not on file    Outpatient Medications Prior to Visit  Medication Sig Dispense Refill   albuterol (VENTOLIN HFA) 108 (90 Base) MCG/ACT inhaler INHALE 1 TO 2 PUFFS BY MOUTH EVERY 6 HOURS AS NEEDED FOR WHEEZING FOR SHORTNESS OF BREATH 18 g 0   amitriptyline (ELAVIL) 75 MG tablet Take 1 tablet (75 mg total) by mouth at bedtime. 90 tablet 1   amoxicillin-clavulanate (AUGMENTIN) 875-125 MG tablet Take 1 tablet by mouth 2 (two) times daily. 20 tablet 0   azithromycin (ZITHROMAX) 250 MG tablet 2 DAILY FOR FIRST DAY, THEN DECREASE TO ONE DAILY FOR 4 MORE DAYS. 6 tablet 0   cetirizine (ZYRTEC) 10 MG tablet Take 1 tablet (10 mg total) by mouth daily. 30 tablet 11   cyanocobalamin (VITAMIN B12) 1000 MCG/ML injection INJECT 1ML INTO THE MUSCLE EVERY 14 DAYS 6 mL 0   Fluticasone-Umeclidin-Vilant (TRELEGY ELLIPTA) 200-62.5-25 MCG/ACT AEPB Inhale 1 puff into the lungs daily. 3 each 3   gabapentin (NEURONTIN) 300 MG capsule TAKE 2 CAPSULES THREE TIMES DAILY 180 capsule 2   HYDROcodone-acetaminophen (NORCO/VICODIN) 5-325 MG tablet Take 1 tablet by mouth daily as needed for  severe pain (migraines). 30 tablet 0   megestrol (MEGACE) 20 MG tablet Take 2 tablets (40 mg total) by mouth daily. 180 tablet 1   melatonin 10 MG TABS Take 20 mg by mouth at bedtime. 1 tablet 0   meloxicam (MOBIC) 15 MG tablet TAKE 1 TABLET EVERY DAY 90 tablet 1   midodrine (PROAMATINE) 2.5 MG tablet Take 1 tablet (2.5 mg total) by mouth 3 (three) times daily with meals. 270 tablet 1   mirtazapine (REMERON) 45 MG tablet Take 1 tablet (45 mg total) by mouth at bedtime. 90 tablet 1   NICOTINE STEP 1 21 MG/24HR patch PLACE ONE PATCH onto THE SKIN DAILY 30 patch 0   omeprazole (PRILOSEC) 40 MG capsule Take 1 capsule (40 mg total) by mouth 2 (two) times daily. 180 capsule 1   orphenadrine (NORFLEX) 100 MG tablet Take 1 tablet (100 mg total) by mouth 2 (two) times daily as needed. 180 tablet 0   predniSONE (DELTASONE) 20 MG tablet Take 3 tablets (60 mg total) by mouth daily with breakfast for 3 days, THEN 2 tablets (40 mg total) daily with breakfast for 3 days, THEN 1 tablet (20 mg total) daily with breakfast for 3 days. 18 tablet 0   QUEtiapine (SEROQUEL) 50 MG tablet Take 1 tablet (50 mg total) by mouth at bedtime. 90 tablet 0   rizatriptan (MAXALT-MLT) 10 MG disintegrating tablet DISSOLVE 1 TABLET ON THE TONGUE AT ONSET OF MIGRAINE, REPEAT IN 2 HOURS IF NEEDED 10 tablet 2   sertraline (ZOLOFT) 50 MG tablet Take 1 tablet (50 mg total) by mouth daily. 90 tablet 1   traZODone (DESYREL) 100 MG tablet TAKE 1 TABLET AT BEDTIME AS NEEDED FOR SLEEP 90 tablet 1   Facility-Administered Medications Prior to Visit  Medication Dose Route Frequency Provider Last Rate Last Admin   ipratropium-albuterol (DUONEB) 0.5-2.5 (3) MG/3ML nebulizer solution 3 mL  3 mL Nebulization Once Andromeda Poppen, MD        Allergies  Allergen Reactions   Clindamycin/Lincomycin Nausea And Vomiting   Lyrica [Pregabalin] Other (See Comments)    PERSONALITY CHANGE    Review of  Systems  Constitutional:  Positive for fatigue.  HENT:   Positive for congestion. Negative for ear pain and sore throat.   Respiratory:  Positive for cough and shortness of breath.   Cardiovascular:  Negative for chest pain.  Gastrointestinal:  Negative for abdominal pain, constipation, diarrhea, nausea and vomiting.  Genitourinary:  Negative for dysuria, frequency and urgency.  Musculoskeletal:  Negative for arthralgias, back pain and myalgias.  Neurological:  Positive for weakness and headaches. Negative for dizziness.  Psychiatric/Behavioral:  Negative for agitation and sleep disturbance. The patient is not nervous/anxious.        Objective:    Physical Exam Vitals reviewed.  Constitutional:      Appearance: Normal appearance. She is normal weight.  Neck:     Vascular: No carotid bruit.  Cardiovascular:     Rate and Rhythm: Normal rate and regular rhythm.     Heart sounds: Normal heart sounds.  Pulmonary:     Effort: Pulmonary effort is normal.     Breath sounds: Wheezing (bilateral) present.  Abdominal:     General: Abdomen is flat. Bowel sounds are normal.     Palpations: Abdomen is soft.     Tenderness: There is no abdominal tenderness.  Neurological:     Mental Status: She is alert and oriented to person, place, and time.  Psychiatric:        Mood and Affect: Mood normal.        Behavior: Behavior normal.     There were no vitals taken for this visit. Wt Readings from Last 3 Encounters:  11/23/21 84 lb (38.1 kg)  11/16/21 84 lb (38.1 kg)  10/19/21 81 lb (36.7 kg)    Health Maintenance Due  Topic Date Due   Hepatitis C Screening  Never done   Zoster Vaccines- Shingrix (1 of 2) Never done   COVID-19 Vaccine (3 - Moderna risk series) 03/29/2020   INFLUENZA VACCINE  10/03/2021    There are no preventive care reminders to display for this patient.   Lab Results  Component Value Date   TSH 1.220 07/11/2021   Lab Results  Component Value Date   WBC 14.4 (H) 11/23/2021   HGB 12.1 11/23/2021   HCT 37.4  11/23/2021   MCV 95 11/23/2021   PLT 248 11/23/2021   Lab Results  Component Value Date   NA 138 11/23/2021   K 4.0 11/23/2021   CO2 24 11/23/2021   GLUCOSE 87 11/23/2021   BUN 24 11/23/2021   CREATININE 0.72 11/23/2021   BILITOT <0.2 11/23/2021   ALKPHOS 74 11/23/2021   AST 40 11/23/2021   ALT 31 11/23/2021   PROT 6.1 11/23/2021   ALBUMIN 3.8 11/23/2021   CALCIUM 9.2 11/23/2021   ANIONGAP 3 (L) 07/03/2016   EGFR 87 11/23/2021   Lab Results  Component Value Date   CHOL 132 01/30/2021   Lab Results  Component Value Date   HDL 64 01/30/2021   Lab Results  Component Value Date   LDLCALC 45 01/30/2021   Lab Results  Component Value Date   TRIG 133 01/30/2021   Lab Results  Component Value Date   CHOLHDL 2.1 01/30/2021   No results found for: "HGBA1C"     Assessment & Plan:   Problem List Items Addressed This Visit   None Visit Diagnoses     COVID    -  Primary   Relevant Orders   POC COVID-19 [facility]      No orders of the defined  types were placed in this encounter.   Orders Placed This Encounter  Procedures   POC COVID-19 BinaxNow     Follow-up: No follow-ups on file.  An After Visit Summary was printed and given to the patient.  [name], MD [facility] [phone]

## 2021-12-01 DIAGNOSIS — J22 Unspecified acute lower respiratory infection: Secondary | ICD-10-CM | POA: Insufficient documentation

## 2021-12-01 NOTE — Assessment & Plan Note (Addendum)
Your COVID test is positive. You should remain isolated and quarantine for at least 5 days from start of symptoms. You must be feeling better and be fever free without any fever reducers for at least 24 hours as well. You should wear a mask at all times when out of your home or around others for 5 days after leaving isolation.  Your household contacts should be tested as well as work contacts. If you feel worse or have increasing shortness of breath, you should be seen in person at urgent care or the emergency room.   Rx: paxlovid Recommend rest, fluids, 3 meals per day.  Fever reducing medicines.  OTC cold and congestion medicines.  Complete prednisone.

## 2021-12-02 ENCOUNTER — Encounter: Payer: Self-pay | Admitting: Family Medicine

## 2021-12-04 NOTE — Progress Notes (Signed)
[facility] cannot fill sertraline 9/23, meloxicam 8/19, megestrol 8/17, trazodone and omeprazole 8/7 all 90DS due to them being recently filled through [employer]. Pt has been informed and she is calling [employer] to get them to stop filling her medications. I provided her with their number to call. Pt understood.  Elray Mcgregor, [place] Pharmacist Assistant  [phone]

## 2021-12-06 ENCOUNTER — Ambulatory Visit (INDEPENDENT_AMBULATORY_CARE_PROVIDER_SITE_OTHER): Payer: Medicare HMO

## 2021-12-06 DIAGNOSIS — E538 Deficiency of other specified B group vitamins: Secondary | ICD-10-CM | POA: Diagnosis not present

## 2021-12-06 MED ORDER — CYANOCOBALAMIN 1000 MCG/ML IJ SOLN
1000.0000 ug | Freq: Once | INTRAMUSCULAR | Status: AC
  Administered 2021-12-06: 1000 ug via INTRAMUSCULAR

## 2021-12-07 ENCOUNTER — Telehealth: Payer: Self-pay

## 2021-12-07 DIAGNOSIS — R0981 Nasal congestion: Secondary | ICD-10-CM | POA: Diagnosis not present

## 2021-12-07 DIAGNOSIS — R062 Wheezing: Secondary | ICD-10-CM | POA: Diagnosis not present

## 2021-12-07 DIAGNOSIS — R059 Cough, unspecified: Secondary | ICD-10-CM | POA: Diagnosis not present

## 2021-12-07 NOTE — Telephone Encounter (Signed)
Per Dr. Tobie Poet: We have had previous issues with her behaviour towards staff. She was discharged previously and Dr. Tobie Poet allowed her to return this past spring.

## 2021-12-07 NOTE — Telephone Encounter (Signed)
On Wednesday, October 4 around 2:07 pm, this patient came in for a b12 shot. She stated that she is not feeling better. She has not slept in 3 days, still having a cough and shob. She was notified to go to [facility] or emergency department for her symptoms if they get worse between now and the time that I call her back. Patient stated that she wants to see Dr. Tobie Poet. Please call the patient and have her come in at 10:00 chart time on Thursday.  7:35 am-Thursday, October 5, I called the pt to let her know that Dr. Tobie Poet is willing to work her in this morning at 10:00. The pt stated that she has an oil change at that time and is unable to come in. Dr. Tobie Poet notified. She recommended for pt to go to [facility] to be seen. Pt notified. Pt stated welp there goes that relationship and the call was ended.  Dr. Tobie Poet was notified. She then asked me to discharge the patient.

## 2021-12-11 ENCOUNTER — Ambulatory Visit (INDEPENDENT_AMBULATORY_CARE_PROVIDER_SITE_OTHER): Payer: Medicare HMO | Admitting: Family Medicine

## 2021-12-11 ENCOUNTER — Encounter: Payer: Self-pay | Admitting: Family Medicine

## 2021-12-11 VITALS — BP 80/60 | HR 108 | Temp 100.0°F | Resp 16 | Ht 61.0 in | Wt 91.0 lb

## 2021-12-11 DIAGNOSIS — B370 Candidal stomatitis: Secondary | ICD-10-CM | POA: Diagnosis not present

## 2021-12-11 DIAGNOSIS — J189 Pneumonia, unspecified organism: Secondary | ICD-10-CM | POA: Diagnosis not present

## 2021-12-11 DIAGNOSIS — J441 Chronic obstructive pulmonary disease with (acute) exacerbation: Secondary | ICD-10-CM | POA: Diagnosis not present

## 2021-12-11 DIAGNOSIS — F17200 Nicotine dependence, unspecified, uncomplicated: Secondary | ICD-10-CM

## 2021-12-11 MED ORDER — CLOTRIMAZOLE 10 MG MT TROC: 10.0000 mg | Freq: Every day | OROMUCOSAL | 3 refills | Status: DC

## 2021-12-11 MED ORDER — PREDNISONE 20 MG PO TABS: ORAL_TABLET | ORAL | 0 refills | Status: AC

## 2021-12-11 MED ORDER — AMOXICILLIN-POT CLAVULANATE 875-125 MG PO TABS: 1.0000 | ORAL_TABLET | Freq: Two times a day (BID) | ORAL | 0 refills | Status: DC

## 2021-12-11 MED ORDER — BENZONATATE 200 MG PO CAPS: 200.0000 mg | ORAL_CAPSULE | Freq: Three times a day (TID) | ORAL | 0 refills | Status: DC | PRN

## 2021-12-11 NOTE — Assessment & Plan Note (Signed)
Recommend patient start nicoderm patches. Must quit smoking or her lungs are going to continue to deteriorate.

## 2021-12-11 NOTE — Assessment & Plan Note (Signed)
mycelex troches sent.

## 2021-12-11 NOTE — Patient Instructions (Signed)
Recommend repeat prednisone pack.  Start augmentin.  Stop doxycycline.  Start tessalon perles for cough.  Continue tresiba.  Continue albuterol nebulizers every 4 hours as needed.   Thrush: mycelex troches one five times a day x 5 days.

## 2021-12-11 NOTE — Assessment & Plan Note (Signed)
Change doxycycline to augmentin

## 2021-12-11 NOTE — Assessment & Plan Note (Addendum)
Prednisone taper.  Tessalon perles sent.   I agree to let Anne [name] remain our patient. I did caution her against repeat behavior.

## 2021-12-11 NOTE — Progress Notes (Signed)
Acute Office Visit  Subjective:    Patient ID: Anne [name], female    DOB: 04/17/1945, 76 y.o.   MRN: [id number]  Chief Complaint  Patient presents with   Pneumonia    HPI: Patient is in today for dry cough, chest congestion, wheezing after she had covid 3 weeks ago. She went to urgent care and they did Chest xray which showed pneumonia on her left lung. They gave her doxycycline and cough syrup, but she still feeling not better. Albuterol 6 times a day. Trelegy once daily. She completed her last long taper of prednisone on 12/01/2021.  Last week Anne [name] I attempted to work her in on Thursday morning. She could not come at the time I had and so I recommended she go to the urgent care. She responded to my Psychologist, counselling, that perhaps it was time to find another provider. At that time I recommended she be discharged as this is not the first time I have had an issue and discharged her. I let her return in the spring because her health is very poor and she pleaded with me. She again is pleading with me to stay. She does not recall making such a comment.   Past Medical History:  Diagnosis Date   Acquired absence of both breasts and nipples 06/08/2015   Acute infection of nasal sinus 05/04/2021   Aortic atherosclerosis 01/30/2021   Asthma    daily inhalers   Ataxia 10/18/2020   Bilateral carpal tunnel syndrome 01/30/2021   Bipolar disorder, mixed 06/16/2021   Breast cancer of upper-inner quadrant of left female breast 04/15/2015   Carotid artery disease 05/04/2016   Mild, 1-39% bilaterally by doppler in March 2018   Chronic pain in right shoulder 12/25/2020   COPD with acute exacerbation 07/03/2016   Dental crowns present    Drug induced constipation 04/10/2021   EIC (epidermal inclusion cyst) 03/21/2021   Family history of breast cancer in female 04/26/2015   Dx. Niece at 12; dx. Maternal aunt in her late 68s  Formatting of this note might be different from the original.  Overview:  Dx. Niece at 88; dx. Maternal aunt in her late 63s   Family history of colon cancer 04/26/2015   Dx. In maternal grandfather in his late 59s-70  Formatting of this note might be different from the original. Overview:  Dx. In maternal grandfather in his late 28s-70   Frequent falls 10/18/2020   Genetic testing 06/02/2015   Negative for pathogenic mutations within any of 20 genes on the Breast/Ovarian Cancer Panel through [employer].  No variants of uncertain significance (VUSes) were found.  The Breast/Ovarian Cancer Panel offered by [facility] Anne Pigeon, MD) includes sequencing and deletion/duplication analysis for the following 19 genes:  ATM, BARD1, BRCA1, BRCA2, BRIP1, CDH1, CHEK2, FANCC, M   History of basal cell carcinoma 04/26/2015   History of concussion 01/01/2014   History of kidney stones    History of TIA (transient ischemic attack) 10/2014   Hyperlipidemia    Insomnia    Lumbar back pain 10/18/2020   Major depressive disorder 10/18/2020   Malrotation of intestine 09/23/2017   Migraine aura without headache 02/08/2014   Migraine headache 01/30/2021   Nasal congestion 05/26/2015   Anne finish z-pak 05/27/2015   Nonproductive cough 05/26/2015   PONV (postoperative nausea and vomiting)    "long time ago"  none recently   Psychogenic syncope 04/23/2019   Right lumbar radiculopathy 04/23/2019   RLS (restless legs  syndrome)    Simple chronic bronchitis 07/04/2019   Tobacco use    Tremor 10/18/2020   Vitamin D deficiency    Wears dentures    Weight loss 08/28/2019    Past Surgical History:  Procedure Laterality Date   ABDOMINAL HYSTERECTOMY     APPENDECTOMY  2000   BREAST BIOPSY Left    BREAST RECONSTRUCTION WITH PLACEMENT OF TISSUE EXPANDER AND FLEX HD (ACELLULAR HYDRATED DERMIS) Bilateral 06/02/2015   Procedure: BILATERAL BREAST RECONSTRUCTION WITH PLACEMENT OF TISSUE EXPANDER AND  ACELLULAR HYDRATED DERMIS;  Surgeon: Anne Limbo, MD;   Location: [facility];  Service: Plastics;  Laterality: Bilateral;   CARPAL TUNNEL RELEASE Bilateral    CATARACT EXTRACTION Bilateral 09/2013   KNEE ARTHROSCOPY Left    LAPAROSCOPIC ABDOMINAL EXPLORATION N/A 10/2017   LAPAROSCOPIC LYSIS OF CONGENITAL ADHESIVE BAND/LADD'S BANDS WIH INTRAOPERATIVE CHOLANGIOGRAPHY. Perforation of small intestion requiring repair.    LOOP RECORDER INSERTION N/A 05/11/2016   Procedure: Loop Recorder Insertion;  Surgeon: Anne Anne Leeds, MD;  Location: [facility];  Service: Cardiovascular;  Laterality: N/A;   MASTECTOMY W/ SENTINEL NODE BIOPSY Bilateral 06/02/2015   Procedure: BILATERAL MASTECTOMY WITH LEFT SENTINEL LYMPH NODE BIOPSY;  Surgeon: Anne Klein, MD;  Location: [facility];  Service: General;  Laterality: Bilateral;   NASAL SEPTUM SURGERY     x 3   PLANTAR FASCIA SURGERY Left    REMOVAL OF BILATERAL TISSUE EXPANDERS WITH PLACEMENT OF BILATERAL BREAST IMPLANTS Bilateral 06/17/2015   Procedure: DEBRIDEMENT OF BILATERAL MASTECTOMY FLAP WITH BILATERAL TISSUE EXPANDER EXCHANGE ;  Surgeon: Anne Limbo, MD;  Location: [facility];  Service: Plastics;  Laterality: Bilateral;   REMOVAL OF TISSUE EXPANDER Bilateral 07/12/2015   Procedure: REMOVAL OF BILATERAL TISSUE EXPANDERS;  Surgeon: Anne Limbo, MD;  Location: [facility];  Service: Plastics;  Laterality: Bilateral;    Family History  Problem Relation Age of Onset   Cervical cancer Mother 20   Colon cancer Maternal Grandfather        mets to stomach; dx. 67-70   Heart disease Father    Prostate cancer Father 61   Cervical cancer Maternal Grandmother        dx. 29s; treated with radium implant   Lung cancer Sister 21       maternal half-sister dx. lung cancer, stage III   Breast cancer Maternal Aunt    Cervical cancer Sister 76       paternal half-sister; s/p TAH   Spina bifida Grandchild    Brain cancer Grandchild        grandson dx. at 20 mos,  treated at [facility] cancer Other 43       maternal half-sister's daughter   Cancer Maternal Aunt        d. 34; unspecified type - "began at back area and moved to vital organs"   Cancer Maternal Uncle         late 74s; unspecified type; "started at back and moved to vital organs"    Social History   Socioeconomic History   Marital status: Widowed    Spouse name: Not on file   Number of children: 1   Years of education: 20   Highest education level: Not on file  Occupational History    Comment: [employer], retired  Tobacco Use   Smoking status: Every Day    Packs/day: 0.15    Types: Cigarettes    Last attempt to quit: 03/21/2020  Years since quitting: 1.7   Smokeless tobacco: Never  Vaping Use   Vaping Use: Never used  Substance and Sexual Activity   Alcohol use: Yes    Alcohol/week: 0.0 standard drinks of alcohol    Comment: rarely   Drug use: No   Sexual activity: Not on file  Other Topics Concern   Not on file  Social History Narrative   Lives alone, widow   Caffeine use- tea, 1 cup daily   Right Handed    Lives in a one story [place] home    Social Determinants of Health   Financial Resource Strain: Low Risk  (02/17/2021)   Overall Financial Resource Strain (CARDIA)    Difficulty of Paying Living Expenses: Not hard at all  Food Insecurity: Not on file  Transportation Needs: No Transportation Needs (02/17/2021)   PRAPARE - Hydrologist (Medical): No    Lack of Transportation (Non-Medical): No  Physical Activity: Not on file  Stress: Not on file  Social Connections: Not on file  Intimate Partner Violence: Not on file    Outpatient Medications Prior to Visit  Medication Sig Dispense Refill   albuterol (VENTOLIN HFA) 108 (90 Base) MCG/ACT inhaler INHALE 1 TO 2 PUFFS BY MOUTH EVERY 6 HOURS AS NEEDED FOR WHEEZING FOR SHORTNESS OF BREATH 18 g 0   amitriptyline (ELAVIL) 75 MG tablet Take 1 tablet (75 mg total) by mouth  at bedtime. 90 tablet 1   cetirizine (ZYRTEC) 10 MG tablet Take 1 tablet (10 mg total) by mouth daily. 30 tablet 11   cyanocobalamin (VITAMIN B12) 1000 MCG/ML injection INJECT 1ML INTO THE MUSCLE EVERY 14 DAYS 6 mL 0   Fluticasone-Umeclidin-Vilant (TRELEGY ELLIPTA) 200-62.5-25 MCG/ACT AEPB Inhale 1 puff into the lungs daily. 3 each 3   gabapentin (NEURONTIN) 300 MG capsule TAKE 2 CAPSULES THREE TIMES DAILY 180 capsule 2   HYDROcodone-acetaminophen (NORCO/VICODIN) 5-325 MG tablet Take 1 tablet by mouth daily as needed for severe pain (migraines). 30 tablet 0   megestrol (MEGACE) 20 MG tablet Take 2 tablets (40 mg total) by mouth daily. 180 tablet 1   melatonin 10 MG TABS Take 20 mg by mouth at bedtime. 1 tablet 0   meloxicam (MOBIC) 15 MG tablet TAKE 1 TABLET EVERY DAY 90 tablet 1   midodrine (PROAMATINE) 2.5 MG tablet Take 1 tablet (2.5 mg total) by mouth 3 (three) times daily with meals. 270 tablet 1   mirtazapine (REMERON) 45 MG tablet Take 1 tablet (45 mg total) by mouth at bedtime. 90 tablet 1   NICOTINE STEP 1 21 MG/24HR patch PLACE ONE PATCH onto THE SKIN DAILY 30 patch 0   omeprazole (PRILOSEC) 40 MG capsule Take 1 capsule (40 mg total) by mouth 2 (two) times daily. 180 capsule 1   orphenadrine (NORFLEX) 100 MG tablet Take 1 tablet (100 mg total) by mouth 2 (two) times daily as needed. 180 tablet 0   QUEtiapine (SEROQUEL) 50 MG tablet Take 1 tablet (50 mg total) by mouth at bedtime. 90 tablet 0   rizatriptan (MAXALT-MLT) 10 MG disintegrating tablet DISSOLVE 1 TABLET ON THE TONGUE AT ONSET OF MIGRAINE, REPEAT IN 2 HOURS IF NEEDED 10 tablet 2   sertraline (ZOLOFT) 50 MG tablet Take 1 tablet (50 mg total) by mouth daily. 90 tablet 1   traZODone (DESYREL) 100 MG tablet TAKE 1 TABLET AT BEDTIME AS NEEDED FOR SLEEP 90 tablet 1   doxycycline (VIBRA-TABS) 100 MG tablet Take 100 mg by  mouth 2 (two) times daily.     ipratropium-albuterol (DUONEB) 0.5-2.5 (3) MG/3ML nebulizer solution 3 mL      No  facility-administered medications prior to visit.    Allergies  Allergen Reactions   Clindamycin/Lincomycin Nausea And Vomiting   Lyrica [Pregabalin] Other (See Comments)    PERSONALITY CHANGE    Review of Systems  Constitutional:  Negative for chills, fatigue and fever.  HENT:  Negative for congestion and ear pain.   Respiratory:  Positive for cough, chest tightness, shortness of breath and wheezing.   Cardiovascular:  Negative for chest pain and palpitations.  Gastrointestinal:  Negative for abdominal pain, constipation, diarrhea, nausea and vomiting.  Endocrine: Negative for polydipsia, polyphagia and polyuria.  Genitourinary:  Negative for difficulty urinating and dysuria.  Musculoskeletal:  Negative for arthralgias, back pain and myalgias.  Skin:  Negative for rash.  Neurological:  Negative for headaches.  Psychiatric/Behavioral:  Negative for dysphoric mood. The patient is not nervous/anxious.        Objective:    Physical Exam Vitals reviewed.  Constitutional:      Comments: thin  HENT:     Right Ear: Tympanic membrane, ear canal and external ear normal.     Left Ear: Tympanic membrane, ear canal and external ear normal.     Nose: Nose normal.     Mouth/Throat:     Pharynx: Oropharynx is clear.     Comments: Posterior pharynx thrush.  Cardiovascular:     Rate and Rhythm: Normal rate and regular rhythm.     Heart sounds: Normal heart sounds. No murmur heard. Pulmonary:     Effort: Pulmonary effort is normal. No respiratory distress.     Breath sounds: Rhonchi present.     Comments: Poor air exchange. Neurological:     Mental Status: She is alert and oriented to person, place, and time.  Psychiatric:        Mood and Affect: Mood normal.        Behavior: Behavior normal.     BP (!) 80/60   Pulse (!) 108   Temp 100 F (37.8 C)   Resp 16   Ht 5' 1" (1.549 m)   Wt 91 lb (41.3 kg)   SpO2 94%   BMI 17.19 kg/m  Wt Readings from Last 3 Encounters:   12/11/21 91 lb (41.3 kg)  11/23/21 84 lb (38.1 kg)  11/16/21 84 lb (38.1 kg)    Health Maintenance Due  Topic Date Due   Hepatitis C Screening  Never done   Zoster Vaccines- Shingrix (1 of 2) Never done   INFLUENZA VACCINE  10/03/2021   COVID-19 Vaccine (3 - Moderna risk series) 11/26/2021    There are no preventive care reminders to display for this patient.   Lab Results  Component Value Date   TSH 1.220 07/11/2021   Lab Results  Component Value Date   WBC 14.4 (H) 11/23/2021   HGB 12.1 11/23/2021   HCT 37.4 11/23/2021   MCV 95 11/23/2021   PLT 248 11/23/2021   Lab Results  Component Value Date   NA 138 11/23/2021   K 4.0 11/23/2021   CO2 24 11/23/2021   GLUCOSE 87 11/23/2021   BUN 24 11/23/2021   CREATININE 0.72 11/23/2021   BILITOT <0.2 11/23/2021   ALKPHOS 74 11/23/2021   AST 40 11/23/2021   ALT 31 11/23/2021   PROT 6.1 11/23/2021   ALBUMIN 3.8 11/23/2021   CALCIUM 9.2 11/23/2021   ANIONGAP 3 (L) 07/03/2016  EGFR 87 11/23/2021   Lab Results  Component Value Date   CHOL 132 01/30/2021   Lab Results  Component Value Date   HDL 64 01/30/2021   Lab Results  Component Value Date   LDLCALC 45 01/30/2021   Lab Results  Component Value Date   TRIG 133 01/30/2021   Lab Results  Component Value Date   CHOLHDL 2.1 01/30/2021   No results found for: "HGBA1C"     Assessment & Plan:   Problem List Items Addressed This Visit       Respiratory   COPD exacerbation (HCC)    Prednisone taper.  Tessalon perles sent.   I agree to let Anne [name] remain our patient. I did caution her against repeat behavior.        Relevant Medications   predniSONE (DELTASONE) 20 MG tablet   benzonatate (TESSALON) 200 MG capsule   Pneumonia of right upper lobe due to infectious organism - Primary    Change doxycycline to augmentin      Relevant Medications   amoxicillin-clavulanate (AUGMENTIN) 875-125 MG tablet   predniSONE (DELTASONE) 20 MG tablet    benzonatate (TESSALON) 200 MG capsule   clotrimazole (MYCELEX) 10 MG troche     Digestive   Oral thrush    mycelex troches sent.       Relevant Medications   clotrimazole (MYCELEX) 10 MG troche     Other   Tobacco dependency    Recommend patient start nicoderm patches. Must quit smoking or her lungs are going to continue to deteriorate.      Meds ordered this encounter  Medications   amoxicillin-clavulanate (AUGMENTIN) 875-125 MG tablet    Sig: Take 1 tablet by mouth 2 (two) times daily.    Dispense:  20 tablet    Refill:  0   predniSONE (DELTASONE) 20 MG tablet    Sig: Take 3 tablets (60 mg total) by mouth daily with breakfast for 3 days, THEN 2 tablets (40 mg total) daily with breakfast for 3 days, THEN 1 tablet (20 mg total) daily with breakfast for 3 days.    Dispense:  18 tablet    Refill:  0   benzonatate (TESSALON) 200 MG capsule    Sig: Take 1 capsule (200 mg total) by mouth 3 (three) times daily as needed for cough.    Dispense:  30 capsule    Refill:  0   clotrimazole (MYCELEX) 10 MG troche    Sig: Take 1 tablet (10 mg total) by mouth 5 (five) times daily.    Dispense:  25 Troche    Refill:  3   Total time spent on today's visit was greater than 30 minutes, including both face-to-face time and nonface-to-face time personally spent on review of chart (labs and imaging), discussing labs and goals, discussing further work-up, treatment options, referrals to specialist if needed, reviewing outside records of pertinent, answering patient's questions, and coordinating care.  Follow-up: Return if symptoms worsen or fail to improve.  An After Visit Summary was printed and given to the patient.  [name], MD [facility] [phone]

## 2021-12-12 ENCOUNTER — Ambulatory Visit (INDEPENDENT_AMBULATORY_CARE_PROVIDER_SITE_OTHER): Payer: Medicare HMO

## 2021-12-12 ENCOUNTER — Other Ambulatory Visit: Payer: Self-pay | Admitting: Family Medicine

## 2021-12-12 DIAGNOSIS — E538 Deficiency of other specified B group vitamins: Secondary | ICD-10-CM

## 2021-12-12 DIAGNOSIS — M5416 Radiculopathy, lumbar region: Secondary | ICD-10-CM

## 2021-12-12 MED ORDER — HYDROCODONE-ACETAMINOPHEN 5-325 MG PO TABS: 1.0000 | ORAL_TABLET | Freq: Every day | ORAL | 0 refills | Status: DC | PRN

## 2021-12-13 MED ORDER — CYANOCOBALAMIN 1000 MCG/ML IJ SOLN
1000.0000 ug | Freq: Once | INTRAMUSCULAR | Status: AC
  Administered 2021-12-13: 1000 ug via INTRAMUSCULAR

## 2021-12-20 ENCOUNTER — Telehealth: Payer: Self-pay | Admitting: Family Medicine

## 2021-12-20 NOTE — Telephone Encounter (Signed)
Pt requesting a refill on her b12 to [facility] in [place]    Also, pt has stating she is truly blessed to be treated by Dr. Tobie Poet -- she brought in several crosses with jewels.

## 2021-12-21 ENCOUNTER — Other Ambulatory Visit: Payer: Self-pay | Admitting: Family Medicine

## 2021-12-21 ENCOUNTER — Other Ambulatory Visit: Payer: Self-pay

## 2021-12-21 DIAGNOSIS — E538 Deficiency of other specified B group vitamins: Secondary | ICD-10-CM

## 2021-12-21 MED ORDER — CYANOCOBALAMIN 1000 MCG/ML IJ SOLN: INTRAMUSCULAR | 0 refills | Status: DC

## 2021-12-25 ENCOUNTER — Ambulatory Visit (INDEPENDENT_AMBULATORY_CARE_PROVIDER_SITE_OTHER): Payer: Medicare HMO | Admitting: Family Medicine

## 2021-12-25 ENCOUNTER — Encounter: Payer: Self-pay | Admitting: Family Medicine

## 2021-12-25 VITALS — BP 128/78 | HR 104 | Temp 97.7°F | Ht 61.0 in | Wt 89.6 lb

## 2021-12-25 DIAGNOSIS — J0181 Other acute recurrent sinusitis: Secondary | ICD-10-CM

## 2021-12-25 DIAGNOSIS — J42 Unspecified chronic bronchitis: Secondary | ICD-10-CM

## 2021-12-25 DIAGNOSIS — J410 Simple chronic bronchitis: Secondary | ICD-10-CM

## 2021-12-25 DIAGNOSIS — M810 Age-related osteoporosis without current pathological fracture: Secondary | ICD-10-CM

## 2021-12-25 MED ORDER — PREDNISONE 10 MG PO TABS: 10.0000 mg | ORAL_TABLET | Freq: Every day | ORAL | 0 refills | Status: DC

## 2021-12-25 MED ORDER — LEVOFLOXACIN 500 MG PO TABS: 500.0000 mg | ORAL_TABLET | Freq: Every day | ORAL | 0 refills | Status: AC

## 2021-12-25 NOTE — Progress Notes (Unsigned)
Acute Office Visit  Subjective:    Patient ID: Anne [name], female    DOB: Feb 10, 1946, 76 y.o.   MRN: [id number]  Chief Complaint  Patient presents with   Cough    Chest congestion/Nasal congestion/shortness of breath   Patient is in today for: Cough Associated symptoms include rhinorrhea, shortness of breath and wheezing. Pertinent negatives include no chest pain, ear pain, fever, headaches, myalgias, rash or sore throat.  Patient is using albuterol nebulizers numerous times per day. Completed augmentin.  Past Medical History:  Diagnosis Date   Acquired absence of both breasts and nipples 06/08/2015   Acute infection of nasal sinus 05/04/2021   Aortic atherosclerosis 01/30/2021   Asthma    daily inhalers   Ataxia 10/18/2020   Bilateral carpal tunnel syndrome 01/30/2021   Bipolar disorder, mixed 06/16/2021   Breast cancer of upper-inner quadrant of left female breast 04/15/2015   Carotid artery disease 05/04/2016   Mild, 1-39% bilaterally by doppler in March 2018   Chronic pain in right shoulder 12/25/2020   COPD with acute exacerbation 07/03/2016   Dental crowns present    Drug induced constipation 04/10/2021   EIC (epidermal inclusion cyst) 03/21/2021   Family history of breast cancer in female 04/26/2015   Dx. Niece at 49; dx. Maternal aunt in her late 20s  Formatting of this note might be different from the original. Overview:  Dx. Niece at 13; dx. Maternal aunt in her late 99s   Family history of colon cancer 04/26/2015   Dx. In maternal grandfather in his late 41s-70  Formatting of this note might be different from the original. Overview:  Dx. In maternal grandfather in his late 73s-70   Frequent falls 10/18/2020   Genetic testing 06/02/2015   Negative for pathogenic mutations within any of 20 genes on the Breast/Ovarian Cancer Panel through [employer].  No variants of uncertain significance (VUSes) were found.  The Breast/Ovarian Cancer Panel offered by  [facility] Hope Pigeon, MD) includes sequencing and deletion/duplication analysis for the following 19 genes:  ATM, BARD1, BRCA1, BRCA2, BRIP1, CDH1, CHEK2, FANCC, M   History of basal cell carcinoma 04/26/2015   History of concussion 01/01/2014   History of kidney stones    History of TIA (transient ischemic attack) 10/2014   Hyperlipidemia    Insomnia    Lumbar back pain 10/18/2020   Major depressive disorder 10/18/2020   Malrotation of intestine 09/23/2017   Migraine aura without headache 02/08/2014   Migraine headache 01/30/2021   Nasal congestion 05/26/2015   will finish z-pak 05/27/2015   Nonproductive cough 05/26/2015   PONV (postoperative nausea and vomiting)    "long time ago"  none recently   Psychogenic syncope 04/23/2019   Right lumbar radiculopathy 04/23/2019   RLS (restless legs syndrome)    Simple chronic bronchitis 07/04/2019   Tobacco use    Tremor 10/18/2020   Vitamin D deficiency    Wears dentures    Weight loss 08/28/2019    Past Surgical History:  Procedure Laterality Date   ABDOMINAL HYSTERECTOMY     APPENDECTOMY  2000   BREAST BIOPSY Left    BREAST RECONSTRUCTION WITH PLACEMENT OF TISSUE EXPANDER AND FLEX HD (ACELLULAR HYDRATED DERMIS) Bilateral 06/02/2015   Procedure: BILATERAL BREAST RECONSTRUCTION WITH PLACEMENT OF TISSUE EXPANDER AND  ACELLULAR HYDRATED DERMIS;  Surgeon: Irene Limbo, MD;  Location: [place];  Service: Plastics;  Laterality: Bilateral;   CARPAL TUNNEL RELEASE Bilateral    CATARACT EXTRACTION Bilateral 09/2013  KNEE ARTHROSCOPY Left    LAPAROSCOPIC ABDOMINAL EXPLORATION N/A 10/2017   LAPAROSCOPIC LYSIS OF CONGENITAL ADHESIVE BAND/LADD'S BANDS WIH INTRAOPERATIVE CHOLANGIOGRAPHY. Perforation of small intestion requiring repair.    LOOP RECORDER INSERTION N/A 05/11/2016   Procedure: Loop Recorder Insertion;  Surgeon: Will Meredith Leeds, MD;  Location: [facility];  Service: Cardiovascular;   Laterality: N/A;   MASTECTOMY W/ SENTINEL NODE BIOPSY Bilateral 06/02/2015   Procedure: BILATERAL MASTECTOMY WITH LEFT SENTINEL LYMPH NODE BIOPSY;  Surgeon: Stark Klein, MD;  Location: [place];  Service: General;  Laterality: Bilateral;   NASAL SEPTUM SURGERY     x 3   PLANTAR FASCIA SURGERY Left    REMOVAL OF BILATERAL TISSUE EXPANDERS WITH PLACEMENT OF BILATERAL BREAST IMPLANTS Bilateral 06/17/2015   Procedure: DEBRIDEMENT OF BILATERAL MASTECTOMY FLAP WITH BILATERAL TISSUE EXPANDER EXCHANGE ;  Surgeon: Irene Limbo, MD;  Location: [place];  Service: Plastics;  Laterality: Bilateral;   REMOVAL OF TISSUE EXPANDER Bilateral 07/12/2015   Procedure: REMOVAL OF BILATERAL TISSUE EXPANDERS;  Surgeon: Irene Limbo, MD;  Location: [place];  Service: Plastics;  Laterality: Bilateral;    Family History  Problem Relation Age of Onset   Cervical cancer Mother 59   Colon cancer Maternal Grandfather        mets to stomach; dx. 67-70   Heart disease Father    Prostate cancer Father 45   Cervical cancer Maternal Grandmother        dx. 32s; treated with radium implant   Lung cancer Sister 64       maternal half-sister dx. lung cancer, stage III   Breast cancer Maternal Aunt    Cervical cancer Sister 52       paternal half-sister; s/p TAH   Spina bifida Grandchild    Brain cancer Grandchild        grandson dx. at 20 mos, treated at [facility] Other 31       maternal half-sister's daughter   Cancer Maternal Aunt        d. 10; unspecified type - "began at back area and moved to vital organs"   Cancer Maternal Uncle         late 9s; unspecified type; "started at back and moved to vital organs"    Social History   Socioeconomic History   Marital status: Widowed    Spouse name: Not on file   Number of children: 1   Years of education: 31   Highest education level: Not on file  Occupational History    Comment: [employer], retired   Tobacco Use   Smoking status: Every Day    Packs/day: 0.15    Types: Cigarettes    Last attempt to quit: 03/21/2020    Years since quitting: 1.7   Smokeless tobacco: Never  Vaping Use   Vaping Use: Never used  Substance and Sexual Activity   Alcohol use: Yes    Alcohol/week: 0.0 standard drinks of alcohol    Comment: rarely   Drug use: No   Sexual activity: Not on file  Other Topics Concern   Not on file  Social History Narrative   Lives alone, widow   Caffeine use- tea, 1 cup daily   Right Handed    Lives in a one story [place] home    Social Determinants of Health   Financial Resource Strain: Low Risk  (02/17/2021)   Overall Financial Resource Strain (CARDIA)    Difficulty of  Paying Living Expenses: Not hard at all  Food Insecurity: Not on file  Transportation Needs: No Transportation Needs (02/17/2021)   PRAPARE - Hydrologist (Medical): No    Lack of Transportation (Non-Medical): No  Physical Activity: Not on file  Stress: Not on file  Social Connections: Not on file  Intimate Partner Violence: Not on file    Outpatient Medications Prior to Visit  Medication Sig Dispense Refill   albuterol (VENTOLIN HFA) 108 (90 Base) MCG/ACT inhaler INHALE 1 TO 2 PUFFS BY MOUTH EVERY 6 HOURS AS NEEDED FOR WHEEZING FOR SHORTNESS OF BREATH 18 g 0   amitriptyline (ELAVIL) 75 MG tablet Take 1 tablet (75 mg total) by mouth at bedtime. 90 tablet 1   cetirizine (ZYRTEC) 10 MG tablet Take 1 tablet (10 mg total) by mouth daily. 30 tablet 11   clotrimazole (MYCELEX) 10 MG troche Take 1 tablet (10 mg total) by mouth 5 (five) times daily. 25 Troche 3   cyanocobalamin (VITAMIN B12) 1000 MCG/ML injection INJECT 1ML INTO THE MUSCLE EVERY 30 DAYS. 3 mL 0   Fluticasone-Umeclidin-Vilant (TRELEGY ELLIPTA) 200-62.5-25 MCG/ACT AEPB Inhale 1 puff into the lungs daily. 3 each 3   gabapentin (NEURONTIN) 300 MG capsule TAKE 2 CAPSULES THREE TIMES DAILY 180 capsule 2    HYDROcodone-acetaminophen (NORCO/VICODIN) 5-325 MG tablet Take 1 tablet by mouth daily as needed for severe pain (migraines). 30 tablet 0   megestrol (MEGACE) 20 MG tablet Take 2 tablets (40 mg total) by mouth daily. 180 tablet 1   melatonin 10 MG TABS Take 20 mg by mouth at bedtime. 1 tablet 0   meloxicam (MOBIC) 15 MG tablet TAKE 1 TABLET EVERY DAY 90 tablet 1   midodrine (PROAMATINE) 2.5 MG tablet Take 1 tablet (2.5 mg total) by mouth 3 (three) times daily with meals. 270 tablet 1   mirtazapine (REMERON) 45 MG tablet Take 1 tablet (45 mg total) by mouth at bedtime. 90 tablet 1   NICOTINE STEP 1 21 MG/24HR patch PLACE ONE PATCH onto THE SKIN DAILY 30 patch 0   omeprazole (PRILOSEC) 40 MG capsule Take 1 capsule (40 mg total) by mouth 2 (two) times daily. 180 capsule 1   orphenadrine (NORFLEX) 100 MG tablet Take 1 tablet (100 mg total) by mouth 2 (two) times daily as needed. 180 tablet 0   rizatriptan (MAXALT-MLT) 10 MG disintegrating tablet DISSOLVE 1 TABLET ON THE TONGUE AT ONSET OF MIGRAINE, REPEAT IN 2 HOURS IF NEEDED 10 tablet 2   sertraline (ZOLOFT) 50 MG tablet Take 1 tablet (50 mg total) by mouth daily. 90 tablet 1   traZODone (DESYREL) 100 MG tablet TAKE 1 TABLET AT BEDTIME AS NEEDED FOR SLEEP 90 tablet 1   QUEtiapine (SEROQUEL) 50 MG tablet Take 1 tablet (50 mg total) by mouth at bedtime. 90 tablet 0   amoxicillin-clavulanate (AUGMENTIN) 875-125 MG tablet Take 1 tablet by mouth 2 (two) times daily. 20 tablet 0   benzonatate (TESSALON) 200 MG capsule Take 1 capsule (200 mg total) by mouth 3 (three) times daily as needed for cough. 30 capsule 0   No facility-administered medications prior to visit.    Allergies  Allergen Reactions   Clindamycin/Lincomycin Nausea And Vomiting   Lyrica [Pregabalin] Other (See Comments)    PERSONALITY CHANGE    Review of Systems  Constitutional:  Negative for appetite change, fatigue and fever.  HENT:  Positive for congestion (Head/chest), rhinorrhea  and sinus pressure. Negative for ear pain and  sore throat.   Respiratory:  Positive for cough, shortness of breath and wheezing. Negative for chest tightness.   Cardiovascular:  Negative for chest pain and palpitations.  Gastrointestinal:  Negative for abdominal pain, constipation, diarrhea, nausea and vomiting.  Genitourinary:  Negative for dysuria and hematuria.  Musculoskeletal:  Negative for arthralgias, back pain, joint swelling and myalgias.  Skin:  Negative for rash.  Neurological:  Negative for dizziness, weakness and headaches.  Psychiatric/Behavioral:  Negative for dysphoric mood. The patient is not nervous/anxious.        Objective:    Physical Exam Vitals reviewed.  Constitutional:      Appearance: Normal appearance.  HENT:     Right Ear: Tympanic membrane normal.     Left Ear: Tympanic membrane normal.     Nose: Congestion present. No rhinorrhea.     Comments: Sinus tenderness     Mouth/Throat:     Pharynx: No oropharyngeal exudate or posterior oropharyngeal erythema.  Cardiovascular:     Rate and Rhythm: Normal rate and regular rhythm.     Heart sounds: Normal heart sounds.  Pulmonary:     Effort: Pulmonary effort is normal.     Breath sounds: Wheezing present.  Abdominal:     General: Abdomen is flat. Bowel sounds are normal.     Palpations: Abdomen is soft.  Neurological:     Mental Status: She is alert and oriented to person, place, and time.  Psychiatric:        Mood and Affect: Mood normal.        Behavior: Behavior normal.     BP 128/78   Pulse (!) 104   Temp 97.7 F (36.5 C) (Temporal)   Ht 5' 1"  (1.549 m)   Wt 89 lb 9.6 oz (40.6 kg)   SpO2 95%   BMI 16.93 kg/m  Wt Readings from Last 3 Encounters:  12/27/21 90 lb 3.2 oz (40.9 kg)  12/25/21 89 lb 9.6 oz (40.6 kg)  12/11/21 91 lb (41.3 kg)    Health Maintenance Due  Topic Date Due   Hepatitis C Screening  Never done   Zoster Vaccines- Shingrix (1 of 2) Never done   Medicare Annual  Wellness (AWV)  08/22/2019   INFLUENZA VACCINE  10/03/2021   COVID-19 Vaccine (3 - Moderna risk series) 11/26/2021    There are no preventive care reminders to display for this patient.   Lab Results  Component Value Date   TSH 1.220 07/11/2021   Lab Results  Component Value Date   WBC 14.4 (H) 11/23/2021   HGB 12.1 11/23/2021   HCT 37.4 11/23/2021   MCV 95 11/23/2021   PLT 248 11/23/2021   Lab Results  Component Value Date   NA 138 11/23/2021   K 4.0 11/23/2021   CO2 24 11/23/2021   GLUCOSE 87 11/23/2021   BUN 24 11/23/2021   CREATININE 0.72 11/23/2021   BILITOT <0.2 11/23/2021   ALKPHOS 74 11/23/2021   AST 40 11/23/2021   ALT 31 11/23/2021   PROT 6.1 11/23/2021   ALBUMIN 3.8 11/23/2021   CALCIUM 9.2 11/23/2021   ANIONGAP 3 (L) 07/03/2016   EGFR 87 11/23/2021   Lab Results  Component Value Date   CHOL 132 01/30/2021   Lab Results  Component Value Date   HDL 64 01/30/2021   Lab Results  Component Value Date   LDLCALC 45 01/30/2021   Lab Results  Component Value Date   TRIG 133 01/30/2021   Lab Results  Component  Value Date   CHOLHDL 2.1 01/30/2021   No results found for: "HGBA1C"       Assessment & Plan:   There are no diagnoses linked to this encounter.   Meds ordered this encounter  Medications   predniSONE (DELTASONE) 10 MG tablet    Sig: Take 1 tablet (10 mg total) by mouth daily with breakfast.    Dispense:  30 tablet    Refill:  0   levofloxacin (LEVAQUIN) 500 MG tablet    Sig: Take 1 tablet (500 mg total) by mouth daily for 7 days.    Dispense:  7 tablet    Refill:  0   I,Lucciana Head,acting as a scribe for [name], MD.,have documented all relevant documentation on the behalf of [name], MD,as directed by  [name], MD while in the presence of [name], MD.   Alvina Filbert Peterson [place] as a scribe for [name], MD.,have documented all relevant documentation on the behalf of [name], MD,as directed by  [name],  MD while in the presence of [name], MD.   [name], MD

## 2021-12-26 ENCOUNTER — Other Ambulatory Visit: Payer: Self-pay | Admitting: Family Medicine

## 2021-12-27 ENCOUNTER — Encounter: Payer: Self-pay | Admitting: Nurse Practitioner

## 2021-12-27 ENCOUNTER — Ambulatory Visit (INDEPENDENT_AMBULATORY_CARE_PROVIDER_SITE_OTHER): Payer: Medicare HMO | Admitting: Nurse Practitioner

## 2021-12-27 VITALS — BP 142/82 | HR 62 | Temp 97.7°F | Ht 61.0 in | Wt 90.2 lb

## 2021-12-27 DIAGNOSIS — E440 Moderate protein-calorie malnutrition: Secondary | ICD-10-CM

## 2021-12-27 DIAGNOSIS — R21 Rash and other nonspecific skin eruption: Secondary | ICD-10-CM

## 2021-12-27 DIAGNOSIS — J4540 Moderate persistent asthma, uncomplicated: Secondary | ICD-10-CM | POA: Diagnosis not present

## 2021-12-27 DIAGNOSIS — R918 Other nonspecific abnormal finding of lung field: Secondary | ICD-10-CM | POA: Diagnosis not present

## 2021-12-27 DIAGNOSIS — F17210 Nicotine dependence, cigarettes, uncomplicated: Secondary | ICD-10-CM | POA: Diagnosis not present

## 2021-12-27 MED ORDER — TRIAMCINOLONE ACETONIDE 0.025 % EX CREA: 1.0000 | TOPICAL_CREAM | Freq: Two times a day (BID) | CUTANEOUS | 0 refills | Status: DC

## 2021-12-27 MED ORDER — HYDROXYZINE PAMOATE 25 MG PO CAPS: 25.0000 mg | ORAL_CAPSULE | Freq: Three times a day (TID) | ORAL | 1 refills | Status: DC | PRN

## 2021-12-27 NOTE — Patient Instructions (Addendum)
Recommend Dove bar soap for bathing  Apply Triamcinolone cream to rash twice daily Apply Eurcerin, Cetaphil, or Cera Ve' to upper and lower extremities twice daily Push fluids, especially water Take Atarax as needed for itching every 8 hours as needed   Rash, Adult  A rash is a change in the color of your skin. A rash can also change the way your skin feels. There are many different conditions and factors that can cause a rash. Follow these instructions at home: The goal of treatment is to stop the itching and keep the rash from spreading. Watch for any changes in your symptoms. Let your doctor know about them. Follow these instructions to help with your condition: Medicine Take or apply over-the-counter and prescription medicines only as told by your doctor. These may include medicines: To treat red or swollen skin (corticosteroid creams). To treat itching. To treat an allergy (oral antihistamines). To treat very bad symptoms (oral corticosteroids).  Skin care Put cool cloths (compresses) on the affected areas. Do not scratch or rub your skin. Avoid covering the rash. Make sure that the rash is exposed to air as much as possible. Managing itching and discomfort Avoid hot showers or baths. These can make itching worse. A cold shower may help. Try taking a bath with: Epsom salts. You can get these at your local pharmacy or grocery store. Follow the instructions on the package. Baking soda. Pour a small amount into the bath as told by your doctor. Colloidal oatmeal. You can get this at your local pharmacy or grocery store. Follow the instructions on the package. Try putting baking soda paste onto your skin. Stir water into baking soda until it gets like a paste. Try putting on a lotion that relieves itchiness (calamine lotion). Keep cool and out of the sun. Sweating and being hot can make itching worse. General instructions  Rest as needed. Drink enough fluid to keep your pee (urine)  pale yellow. Wear loose-fitting clothing. Avoid scented soaps, detergents, and perfumes. Use gentle soaps, detergents, perfumes, and other cosmetic products. Avoid anything that causes your rash. Keep a journal to help track what causes your rash. Write down: What you eat. What cosmetic products you use. What you drink. What you wear. This includes jewelry. Keep all follow-up visits as told by your doctor. This is important. Contact a doctor if: You sweat at night. You lose weight. You pee (urinate) more than normal. You pee less than normal, or you notice that your pee is a darker color than normal. You feel weak. You throw up (vomit). Your skin or the whites of your eyes look yellow (jaundice). Your skin: Tingles. Is numb. Your rash: Does not go away after a few days. Gets worse. You are: More thirsty than normal. More tired than normal. You have: New symptoms. Pain in your belly (abdomen). A fever. Watery poop (diarrhea). Get help right away if: You have a fever and your symptoms suddenly get worse. You start to feel mixed up (confused). You have a very bad headache or a stiff neck. You have very bad joint pains or stiffness. You have jerky movements that you cannot control (seizure). Your rash covers all or most of your body. The rash may or may not be painful. You have blisters that: Are on top of the rash. Grow larger. Grow together. Are painful. Are inside your nose or mouth. You have a rash that: Looks like purple pinprick-sized spots all over your body. Has a "bull's eye" or  looks like a target. Is red and painful, causes your skin to peel, and is not from being in the sun too long. Summary A rash is a change in the color of your skin. A rash can also change the way your skin feels. The goal of treatment is to stop the itching and keep the rash from spreading. Take or apply over-the-counter and prescription medicines only as told by your doctor. Contact a  doctor if you have new symptoms or symptoms that get worse. Keep all follow-up visits as told by your doctor. This is important. This information is not intended to replace advice given to you by your health care provider. Make sure you discuss any questions you have with your health care provider. Document Revised: 08/22/2021 Document Reviewed: 12/01/2020 Elsevier Patient Education  [place].

## 2021-12-27 NOTE — Progress Notes (Signed)
Acute Office Visit  Subjective:    Patient ID: Anne [name], female    DOB: 08-08-1945, 76 y.o.   MRN: [id number]  Chief Complaint  Patient presents with  . Rash   HPI: Pt presents for evaluation of pruritic rash to bilateral forearms and bilateral feet. Onset was 3 days ago. Treatment has included Benadryl orally. She was prescribed Prednisone  10 mg QD on 12/25/21 by PCP and referred to pulmonologist. She denies changing soaps, lotions, or detergents. She uses [employer] soap for bathing. Pt is frail in appearance, Weight 90 pounds, BMI 17.04   Past Medical History:  Diagnosis Date  . Acquired absence of both breasts and nipples 06/08/2015  . Acute infection of nasal sinus 05/04/2021  . Aortic atherosclerosis 01/30/2021  . Asthma    daily inhalers  . Ataxia 10/18/2020  . Bilateral carpal tunnel syndrome 01/30/2021  . Bipolar disorder, mixed 06/16/2021  . Breast cancer of upper-inner quadrant of left female breast 04/15/2015  . Carotid artery disease 05/04/2016   Mild, 1-39% bilaterally by doppler in March 2018  . Chronic pain in right shoulder 12/25/2020  . COPD with acute exacerbation 07/03/2016  . Dental crowns present   . Drug induced constipation 04/10/2021  . EIC (epidermal inclusion cyst) 03/21/2021  . Family history of breast cancer in female 04/26/2015   Dx. Niece at 56; dx. Maternal aunt in her late 42s  Formatting of this note might be different from the original. Overview:  Dx. Niece at 10; dx. Maternal aunt in her late 26s  . Family history of colon cancer 04/26/2015   Dx. In maternal grandfather in his late 59s-70  Formatting of this note might be different from the original. Overview:  Dx. In maternal grandfather in his late 37s-70  . Frequent falls 10/18/2020  . Genetic testing 06/02/2015   Negative for pathogenic mutations within any of 20 genes on the Breast/Ovarian Cancer Panel through [employer].  No variants of uncertain significance (VUSes)  were found.  The Breast/Ovarian Cancer Panel offered by [facility] Hope Pigeon, MD) includes sequencing and deletion/duplication analysis for the following 19 genes:  ATM, BARD1, BRCA1, BRCA2, BRIP1, CDH1, CHEK2, FANCC, M  . History of basal cell carcinoma 04/26/2015  . History of concussion 01/01/2014  . History of kidney stones   . History of TIA (transient ischemic attack) 10/2014  . Hyperlipidemia   . Insomnia   . Lumbar back pain 10/18/2020  . Major depressive disorder 10/18/2020  . Malrotation of intestine 09/23/2017  . Migraine aura without headache 02/08/2014  . Migraine headache 01/30/2021  . Nasal congestion 05/26/2015   will finish z-pak 05/27/2015  . Nonproductive cough 05/26/2015  . PONV (postoperative nausea and vomiting)    "long time ago"  none recently  . Psychogenic syncope 04/23/2019  . Right lumbar radiculopathy 04/23/2019  . RLS (restless legs syndrome)   . Simple chronic bronchitis 07/04/2019  . Tobacco use   . Tremor 10/18/2020  . Vitamin D deficiency   . Wears dentures   . Weight loss 08/28/2019    Past Surgical History:  Procedure Laterality Date  . ABDOMINAL HYSTERECTOMY    . APPENDECTOMY  2000  . BREAST BIOPSY Left   . BREAST RECONSTRUCTION WITH PLACEMENT OF TISSUE EXPANDER AND FLEX HD (ACELLULAR HYDRATED DERMIS) Bilateral 06/02/2015   Procedure: BILATERAL BREAST RECONSTRUCTION WITH PLACEMENT OF TISSUE EXPANDER AND  ACELLULAR HYDRATED DERMIS;  Surgeon: Irene Limbo, MD;  Location: [place];  Service: Plastics;  Laterality:  Bilateral;  . CARPAL TUNNEL RELEASE Bilateral   . CATARACT EXTRACTION Bilateral 09/2013  . KNEE ARTHROSCOPY Left   . 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 Perforation of small intestion requiring repair.   Marland Kitchen LOOP RECORDER INSERTION N/A 05/11/2016   Procedure: Loop Recorder Insertion;  Surgeon: Will  Meredith Leeds, MD;  Location: [facility];  Service: Cardiovascular;  Laterality: N/A;  . MASTECTOMY W/ SENTINEL NODE BIOPSY Bilateral 06/02/2015   Procedure: BILATERAL MASTECTOMY WITH LEFT SENTINEL LYMPH NODE BIOPSY;  Surgeon: Stark Klein, MD;  Location: [place];  Service: General;  Laterality: Bilateral;  . NASAL SEPTUM SURGERY     x 3  . PLANTAR FASCIA SURGERY Left   . REMOVAL OF BILATERAL TISSUE EXPANDERS WITH PLACEMENT OF BILATERAL BREAST IMPLANTS Bilateral 06/17/2015   Procedure: DEBRIDEMENT OF BILATERAL MASTECTOMY FLAP WITH BILATERAL TISSUE EXPANDER EXCHANGE ;  Surgeon: Irene Limbo, MD;  Location: [place];  Service: Plastics;  Laterality: Bilateral;  . REMOVAL OF TISSUE EXPANDER Bilateral 07/12/2015   Procedure: REMOVAL OF BILATERAL TISSUE EXPANDERS;  Surgeon: Irene Limbo, MD;  Location: [place];  Service: Plastics;  Laterality: Bilateral;    Family History  Problem Relation Age of Onset  . Cervical cancer Mother 80  . Colon cancer Maternal Grandfather        mets to stomach; dx. 67-70  . Heart disease Father   . Prostate cancer Father 38  . Cervical cancer Maternal Grandmother        dx. 70s; treated with radium implant  . Lung cancer Sister 23       maternal half-sister dx. lung cancer, stage III  . Breast cancer Maternal Aunt   . Cervical cancer Sister 21       paternal half-sister; s/p TAH  . Spina bifida Grandchild   . Brain cancer Grandchild        grandson dx. at 20 mos, treated at [facility]  . Breast cancer Other 1       maternal half-sister's daughter  . Cancer Maternal Aunt        d. 32; unspecified type - "began at back area and moved to vital organs"  . Cancer Maternal Uncle         late 58s; unspecified type; "started at back and moved to vital organs"    Social History   Socioeconomic History  . Marital status: Widowed    Spouse name: Not on file  . Number of children: 1  . Years of education: 23  . Highest  education level: Not on file  Occupational History    Comment: [employer], retired  Tobacco Use  . Smoking status: Every Day    Packs/day: 0.15    Types: Cigarettes    Last attempt to quit: 03/21/2020    Years since quitting: 1.7  . Smokeless tobacco: Never  Vaping Use  . Vaping Use: Never used  Substance and Sexual Activity  . Alcohol use: Yes    Alcohol/week: 0.0 standard drinks of alcohol    Comment: rarely  . Drug use: No  . Sexual activity: Not on file  Other Topics Concern  . Not on file  Social History Narrative   Lives alone, widow   Caffeine use- tea, 1 cup daily   Right Handed    Lives in a one story [place] home    Social Determinants of Health   Financial Resource Strain:  Low Risk  (02/17/2021)   Overall Financial Resource Strain (CARDIA)   . Difficulty of Paying Living Expenses: Not hard at all  Food Insecurity: Not on file  Transportation Needs: No Transportation Needs (02/17/2021)   PRAPARE - Transportation   . Lack of Transportation (Medical): No   . Lack of Transportation (Non-Medical): No  Physical Activity: Not on file  Stress: Not on file  Social Connections: Not on file  Intimate Partner Violence: Not on file    Outpatient Medications Prior to Visit  Medication Sig Dispense Refill  . albuterol (VENTOLIN HFA) 108 (90 Base) MCG/ACT inhaler INHALE 1 TO 2 PUFFS BY MOUTH EVERY 6 HOURS AS NEEDED FOR WHEEZING FOR SHORTNESS OF BREATH 18 g 0  . amitriptyline (ELAVIL) 75 MG tablet Take 1 tablet (75 mg total) by mouth at bedtime. 90 tablet 1  . cetirizine (ZYRTEC) 10 MG tablet Take 1 tablet (10 mg total) by mouth daily. 30 tablet 11  . clotrimazole (MYCELEX) 10 MG troche Take 1 tablet (10 mg total) by mouth 5 (five) times daily. 25 Troche 3  . cyanocobalamin (VITAMIN B12) 1000 MCG/ML injection INJECT 1ML INTO THE MUSCLE EVERY 30 DAYS. 3 mL 0  . Fluticasone-Umeclidin-Vilant (TRELEGY ELLIPTA) 200-62.5-25 MCG/ACT AEPB Inhale 1 puff into the lungs daily.  3 each 3  . gabapentin (NEURONTIN) 300 MG capsule TAKE 2 CAPSULES THREE TIMES DAILY 180 capsule 2  . HYDROcodone-acetaminophen (NORCO/VICODIN) 5-325 MG tablet Take 1 tablet by mouth daily as needed for severe pain (migraines). 30 tablet 0  . levofloxacin (LEVAQUIN) 500 MG tablet Take 1 tablet (500 mg total) by mouth daily for 7 days. 7 tablet 0  . megestrol (MEGACE) 20 MG tablet Take 2 tablets (40 mg total) by mouth daily. 180 tablet 1  . melatonin 10 MG TABS Take 20 mg by mouth at bedtime. 1 tablet 0  . meloxicam (MOBIC) 15 MG tablet TAKE 1 TABLET EVERY DAY 90 tablet 1  . midodrine (PROAMATINE) 2.5 MG tablet Take 1 tablet (2.5 mg total) by mouth 3 (three) times daily with meals. 270 tablet 1  . mirtazapine (REMERON) 45 MG tablet Take 1 tablet (45 mg total) by mouth at bedtime. 90 tablet 1  . NICOTINE STEP 1 21 MG/24HR patch PLACE ONE PATCH onto THE SKIN DAILY 30 patch 0  . omeprazole (PRILOSEC) 40 MG capsule Take 1 capsule (40 mg total) by mouth 2 (two) times daily. 180 capsule 1  . orphenadrine (NORFLEX) 100 MG tablet Take 1 tablet (100 mg total) by mouth 2 (two) times daily as needed. 180 tablet 0  . predniSONE (DELTASONE) 10 MG tablet Take 1 tablet (10 mg total) by mouth daily with breakfast. 30 tablet 0  . QUEtiapine (SEROQUEL) 50 MG tablet Take 1 tablet (50 mg total) by mouth at bedtime. 90 tablet 0  . rizatriptan (MAXALT-MLT) 10 MG disintegrating tablet DISSOLVE 1 TABLET ON THE TONGUE AT ONSET OF MIGRAINE, REPEAT IN 2 HOURS IF NEEDED 10 tablet 2  . sertraline (ZOLOFT) 50 MG tablet Take 1 tablet (50 mg total) by mouth daily. 90 tablet 1  . traZODone (DESYREL) 100 MG tablet TAKE 1 TABLET AT BEDTIME AS NEEDED FOR SLEEP 90 tablet 1   No facility-administered medications prior to visit.    Allergies  Allergen Reactions  . Clindamycin/Lincomycin Nausea And Vomiting  . Lyrica [Pregabalin] Other (See Comments)    PERSONALITY CHANGE    Review of Systems  Constitutional:  Negative for  appetite change, fatigue and fever.  HENT:  Negative for congestion, ear pain, sinus pressure and sore throat.   Respiratory:  Positive for cough (chronic). Negative for chest tightness, shortness of breath and wheezing.   Cardiovascular:  Negative for chest pain and palpitations.  Gastrointestinal:  Negative for abdominal pain, constipation, diarrhea, nausea and vomiting.  Genitourinary:  Negative for dysuria and hematuria.  Musculoskeletal:  Negative for arthralgias, back pain, joint swelling and myalgias.  Skin:  Positive for rash (Located on bilateral forearms and tops of feet).  Neurological:  Negative for dizziness, weakness and headaches.  Psychiatric/Behavioral:  Negative for dysphoric mood. The patient is not nervous/anxious.        Objective:    Physical Exam Vitals reviewed.  Constitutional:      Appearance: She is ill-appearing (frail in appearance).  Abdominal:     General: Abdomen is flat. Bowel sounds are normal.     Palpations: Abdomen is soft.  Skin:    Findings: Rash present. Rash is urticarial.          Comments: Urticarial rash noted to bilateral forearms and dorsal portion of left foot. Skin to extremities dry with poor skin turgor  Neurological:     Mental Status: She is alert and oriented to person, place, and time.  Psychiatric:        Mood and Affect: Mood normal.        Behavior: Behavior normal.    BP (!) 142/82 (BP Location: Left Arm, Patient Position: Sitting)   Pulse 62   Temp 97.7 F (36.5 C) (Temporal)   Ht _0  (1.549 m)   Wt 90 lb 3.2 oz (40.9 kg)   SpO2 97%   BMI 17.04 kg/m  Wt Readings from Last 3 Encounters:  12/27/21 90 lb 3.2 oz (40.9 kg)  12/25/21 89 lb 9.6 oz (40.6 kg)  12/11/21 91 lb (41.3 kg)    Health Maintenance Due  Topic Date Due  . Hepatitis C Screening  Never done  . Zoster Vaccines- Shingrix (1 of 2) Never done  . Medicare Annual Wellness (AWV)  09/21/2019  . INFLUENZA VACCINE  10/03/2021  . COVID-19 Vaccine (3  - Moderna risk series) 11/26/2021       Lab Results  Component Value Date   TSH 1.220 07/11/2021   Lab Results  Component Value Date   WBC 14.4 (H) 11/23/2021   HGB 12.1 11/23/2021   HCT 37.4 11/23/2021   MCV 95 11/23/2021   PLT 248 11/23/2021   Lab Results  Component Value Date   NA 138 11/23/2021   K 4.0 11/23/2021   CO2 24 11/23/2021   GLUCOSE 87 11/23/2021   BUN 24 11/23/2021   CREATININE 0.72 11/23/2021   BILITOT <0.2 11/23/2021   ALKPHOS 74 11/23/2021   AST 40 11/23/2021   ALT 31 11/23/2021   PROT 6.1 11/23/2021   ALBUMIN 3.8 11/23/2021   CALCIUM 9.2 11/23/2021   ANIONGAP 3 (L) 07/03/2016   EGFR 87 11/23/2021   Lab Results  Component Value Date   CHOL 132 01/30/2021   Lab Results  Component Value Date   HDL 64 01/30/2021   Lab Results  Component Value Date   LDLCALC 45 01/30/2021   Lab Results  Component Value Date   TRIG 133 01/30/2021   Lab Results  Component Value Date   CHOLHDL 2.1 01/30/2021      Assessment & Plan:   1. Rash - hydrOXYzine (VISTARIL) 25 MG capsule; Take 1 capsule (25 mg total) by mouth every 8 (eight) hours  as needed for itching.  Dispense: 30 capsule; Refill: 1 - triamcinolone (KENALOG) 0.025 % cream; Apply 1 Application topically 2 (two) times daily.  Dispense: 30 g; Refill: 0    Recommend Dove bar soap for bathing  Apply Triamcinolone cream to rash twice daily Apply Eurcerin, Cetaphil, or Cera Ve' to upper and lower extremities twice daily Push fluids, especially water Take Atarax as needed for itching every 8 hours as needed  I,Lauren M Auman,acting as a Education administrator for [employer], NP.,have documented all relevant documentation on the behalf of [name], NP,as directed by  [name], NP while in the presence of [name], NP.   [name], CMA  I, [name], NP, have reviewed all documentation for this visit. The documentation on 12/27/21 for the exam, diagnosis, procedures, and  orders are all accurate and complete.   Signed, Jerrell Belfast, DNP 12/27/21 at 10:04 am

## 2021-12-28 ENCOUNTER — Ambulatory Visit: Payer: Medicare HMO | Admitting: Family Medicine

## 2021-12-29 NOTE — Assessment & Plan Note (Signed)
Start Levofloxacin 500 mg daily x 7 days and prednisone 10 mg daily x 10 days. Refer to pulmonologist.

## 2021-12-30 ENCOUNTER — Other Ambulatory Visit: Payer: Self-pay | Admitting: Family Medicine

## 2022-01-01 ENCOUNTER — Other Ambulatory Visit: Payer: Self-pay | Admitting: Nurse Practitioner

## 2022-01-01 DIAGNOSIS — L509 Urticaria, unspecified: Secondary | ICD-10-CM

## 2022-01-01 MED ORDER — FAMOTIDINE 20 MG PO TABS: 20.0000 mg | ORAL_TABLET | Freq: Two times a day (BID) | ORAL | 1 refills | Status: DC

## 2022-01-01 NOTE — Assessment & Plan Note (Signed)
Rx: levaquin

## 2022-01-01 NOTE — Assessment & Plan Note (Signed)
Order dexa

## 2022-01-03 ENCOUNTER — Ambulatory Visit (INDEPENDENT_AMBULATORY_CARE_PROVIDER_SITE_OTHER): Payer: Medicare HMO | Admitting: Family Medicine

## 2022-01-03 VITALS — BP 126/64 | HR 122 | Temp 97.5°F | Ht 61.0 in | Wt 90.8 lb

## 2022-01-03 DIAGNOSIS — B86 Scabies: Secondary | ICD-10-CM | POA: Diagnosis not present

## 2022-01-03 DIAGNOSIS — I951 Orthostatic hypotension: Secondary | ICD-10-CM | POA: Diagnosis not present

## 2022-01-03 DIAGNOSIS — R000 Tachycardia, unspecified: Secondary | ICD-10-CM | POA: Diagnosis not present

## 2022-01-03 MED ORDER — PERMETHRIN 5 % EX CREA: TOPICAL_CREAM | CUTANEOUS | 0 refills | Status: DC

## 2022-01-03 MED ORDER — QULIPTA 60 MG PO TABS: 60.0000 mg | ORAL_TABLET | Freq: Every day | ORAL | 2 refills | Status: DC

## 2022-01-03 NOTE — Progress Notes (Signed)
Acute Office Visit  Subjective:    Patient ID: Anne [name], female    DOB: 1946/01/01, 76 y.o.   MRN: [id number]  Chief Complaint  Patient presents with   Rash    worse   Patient is in today for itchy rash. Diffuse. No new medicines except on prednisone.  Migraines: on qlipta samples. Helping, but needs a new rx or samples.   Past Medical History:  Diagnosis Date   Acquired absence of both breasts and nipples 06/08/2015   Acute infection of nasal sinus 05/04/2021   Aortic atherosclerosis 01/30/2021   Asthma    daily inhalers   Ataxia 10/18/2020   Bilateral carpal tunnel syndrome 01/30/2021   Bipolar disorder, mixed 06/16/2021   Breast cancer of upper-inner quadrant of left female breast 04/15/2015   Carotid artery disease 05/04/2016   Mild, 1-39% bilaterally by doppler in March 2018   Chronic pain in right shoulder 12/25/2020   COPD with acute exacerbation 07/03/2016   Dental crowns present    Drug induced constipation 04/10/2021   EIC (epidermal inclusion cyst) 03/21/2021   Family history of breast cancer in female 04/26/2015   Dx. Niece at 69; dx. Maternal aunt in her late 15s  Formatting of this note might be different from the original. Overview:  Dx. Niece at 29; dx. Maternal aunt in her late 53s   Family history of colon cancer 04/26/2015   Dx. In maternal grandfather in his late 79s-70  Formatting of this note might be different from the original. Overview:  Dx. In maternal grandfather in his late 47s-70   Frequent falls 10/18/2020   Genetic testing 06/02/2015   Negative for pathogenic mutations within any of 20 genes on the Breast/Ovarian Cancer Panel through [employer].  No variants of uncertain significance (VUSes) were found.  The Breast/Ovarian Cancer Panel offered by [facility] Hope Pigeon, MD) includes sequencing and deletion/duplication analysis for the following 19 genes:  ATM, BARD1, BRCA1, BRCA2, BRIP1, CDH1, CHEK2, FANCC, M    History of basal cell carcinoma 04/26/2015   History of concussion 01/01/2014   History of kidney stones    History of TIA (transient ischemic attack) 10/2014   Hyperlipidemia    Insomnia    Lumbar back pain 10/18/2020   Major depressive disorder 10/18/2020   Malrotation of intestine 09/23/2017   Migraine aura without headache 02/08/2014   Migraine headache 01/30/2021   Nasal congestion 05/26/2015   will finish z-pak 05/27/2015   Nonproductive cough 05/26/2015   PONV (postoperative nausea and vomiting)    "long time ago"  none recently   Psychogenic syncope 04/23/2019   Right lumbar radiculopathy 04/23/2019   RLS (restless legs syndrome)    Simple chronic bronchitis 07/04/2019   Tobacco use    Tremor 10/18/2020   Vitamin D deficiency    Wears dentures    Weight loss 08/28/2019    Past Surgical History:  Procedure Laterality Date   ABDOMINAL HYSTERECTOMY     APPENDECTOMY  2000   BREAST BIOPSY Left    BREAST RECONSTRUCTION WITH PLACEMENT OF TISSUE EXPANDER AND FLEX HD (ACELLULAR HYDRATED DERMIS) Bilateral 06/02/2015   Procedure: BILATERAL BREAST RECONSTRUCTION WITH PLACEMENT OF TISSUE EXPANDER AND  ACELLULAR HYDRATED DERMIS;  Surgeon: Irene Limbo, MD;  Location: [place];  Service: Plastics;  Laterality: Bilateral;   CARPAL TUNNEL RELEASE Bilateral    CATARACT EXTRACTION Bilateral 09/2013   KNEE ARTHROSCOPY Left    LAPAROSCOPIC ABDOMINAL EXPLORATION N/A 10/2017   LAPAROSCOPIC LYSIS OF  CONGENITAL ADHESIVE BAND/LADD'S BANDS WIH INTRAOPERATIVE CHOLANGIOGRAPHY. Perforation of small intestion requiring repair.    LOOP RECORDER INSERTION N/A 05/11/2016   Procedure: Loop Recorder Insertion;  Surgeon: Will Meredith Leeds, MD;  Location: [place] [facility];  Service: Cardiovascular;  Laterality: N/A;   MASTECTOMY W/ SENTINEL NODE BIOPSY Bilateral 06/02/2015   Procedure: BILATERAL MASTECTOMY WITH LEFT SENTINEL LYMPH NODE BIOPSY;  Surgeon: Stark Klein, MD;  Location:  [place];  Service: General;  Laterality: Bilateral;   NASAL SEPTUM SURGERY     x 3   PLANTAR FASCIA SURGERY Left    REMOVAL OF BILATERAL TISSUE EXPANDERS WITH PLACEMENT OF BILATERAL BREAST IMPLANTS Bilateral 06/17/2015   Procedure: DEBRIDEMENT OF BILATERAL MASTECTOMY FLAP WITH BILATERAL TISSUE EXPANDER EXCHANGE ;  Surgeon: Irene Limbo, MD;  Location: [place];  Service: Plastics;  Laterality: Bilateral;   REMOVAL OF TISSUE EXPANDER Bilateral 07/12/2015   Procedure: REMOVAL OF BILATERAL TISSUE EXPANDERS;  Surgeon: Irene Limbo, MD;  Location: [place];  Service: Plastics;  Laterality: Bilateral;    Family History  Problem Relation Age of Onset   Cervical cancer Mother 51   Colon cancer Maternal Grandfather        mets to stomach; dx. 67-70   Heart disease Father    Prostate cancer Father 29   Cervical cancer Maternal Grandmother        dx. 25s; treated with radium implant   Lung cancer Sister 16       maternal half-sister dx. lung cancer, stage III   Breast cancer Maternal Aunt    Cervical cancer Sister 39       paternal half-sister; s/p TAH   Spina bifida Grandchild    Brain cancer Grandchild        grandson dx. at 20 mos, treated at [facility] Other 66       maternal half-sister's daughter   Cancer Maternal Aunt        d. 67; unspecified type - "began at back area and moved to vital organs"   Cancer Maternal Uncle         late 67s; unspecified type; "started at back and moved to vital organs"    Social History   Socioeconomic History   Marital status: Widowed    Spouse name: Not on file   Number of children: 1   Years of education: 56   Highest education level: Not on file  Occupational History    Comment: [employer], retired  Tobacco Use   Smoking status: Every Day    Packs/day: 0.15    Types: Cigarettes    Last attempt to quit: 03/21/2020    Years since quitting: 1.8   Smokeless tobacco: Never  Vaping Use    Vaping Use: Never used  Substance and Sexual Activity   Alcohol use: Yes    Alcohol/week: 0.0 standard drinks of alcohol    Comment: rarely   Drug use: No   Sexual activity: Not on file  Other Topics Concern   Not on file  Social History Narrative   Lives alone, widow   Caffeine use- tea, 1 cup daily   Right Handed    Lives in a one story [place] home    Social Determinants of Health   Financial Resource Strain: Low Risk  (02/17/2021)   Overall Financial Resource Strain (CARDIA)    Difficulty of Paying Living Expenses: Not hard at all  Food Insecurity: Not on file  Transportation Needs:  No Transportation Needs (02/17/2021)   PRAPARE - Hydrologist (Medical): No    Lack of Transportation (Non-Medical): No  Physical Activity: Not on file  Stress: Not on file  Social Connections: Not on file  Intimate Partner Violence: Not on file    Outpatient Medications Prior to Visit  Medication Sig Dispense Refill   albuterol (PROVENTIL) (2.5 MG/3ML) 0.083% nebulizer solution Take 2.5 mg by nebulization 4 (four) times daily.     albuterol (VENTOLIN HFA) 108 (90 Base) MCG/ACT inhaler INHALE 1 TO 2 PUFFS BY MOUTH EVERY 6 HOURS AS NEEDED FOR WHEEZING FOR SHORTNESS OF BREATH 18 g 0   amitriptyline (ELAVIL) 75 MG tablet Take 1 tablet (75 mg total) by mouth at bedtime. 90 tablet 1   cetirizine (ZYRTEC) 10 MG tablet Take 1 tablet (10 mg total) by mouth daily. 30 tablet 11   clotrimazole (MYCELEX) 10 MG troche Take 1 tablet (10 mg total) by mouth 5 (five) times daily. 25 Troche 3   cyanocobalamin (VITAMIN B12) 1000 MCG/ML injection INJECT 1ML INTO THE MUSCLE EVERY 30 DAYS. 3 mL 0   famotidine (PEPCID) 20 MG tablet Take 1 tablet (20 mg total) by mouth 2 (two) times daily. 60 tablet 1   Fluticasone-Umeclidin-Vilant (TRELEGY ELLIPTA) 200-62.5-25 MCG/ACT AEPB Inhale 1 puff into the lungs daily. 3 each 3   gabapentin (NEURONTIN) 300 MG capsule TAKE 2 CAPSULES THREE TIMES  DAILY 180 capsule 2   HYDROcodone-acetaminophen (NORCO/VICODIN) 5-325 MG tablet Take 1 tablet by mouth daily as needed for severe pain (migraines). 30 tablet 0   hydrOXYzine (VISTARIL) 25 MG capsule Take 1 capsule (25 mg total) by mouth every 8 (eight) hours as needed for itching. 30 capsule 1   megestrol (MEGACE) 20 MG tablet Take 2 tablets (40 mg total) by mouth daily. 180 tablet 1   melatonin 10 MG TABS Take 20 mg by mouth at bedtime. 1 tablet 0   meloxicam (MOBIC) 15 MG tablet TAKE 1 TABLET EVERY DAY 90 tablet 1   midodrine (PROAMATINE) 2.5 MG tablet Take 1 tablet (2.5 mg total) by mouth 3 (three) times daily with meals. 270 tablet 1   mirtazapine (REMERON) 45 MG tablet Take 1 tablet (45 mg total) by mouth at bedtime. 90 tablet 1   montelukast (SINGULAIR) 10 MG tablet Take 10 mg by mouth daily.     NICOTINE STEP 1 21 MG/24HR patch PLACE ONE PATCH onto THE SKIN DAILY 30 patch 0   omeprazole (PRILOSEC) 40 MG capsule Take 1 capsule (40 mg total) by mouth 2 (two) times daily. 180 capsule 1   orphenadrine (NORFLEX) 100 MG tablet Take 1 tablet (100 mg total) by mouth 2 (two) times daily as needed. 180 tablet 0   predniSONE (DELTASONE) 10 MG tablet Take 1 tablet (10 mg total) by mouth daily with breakfast. 30 tablet 0   QUEtiapine (SEROQUEL) 50 MG tablet TAKE 1 TABLET BY MOUTH AT BEDTIME 90 tablet 0   rizatriptan (MAXALT-MLT) 10 MG disintegrating tablet DISSOLVE 1 TABLET ON THE TONGUE AT ONSET OF MIGRAINE, REPEAT IN 2 HOURS IF NEEDED 10 tablet 2   sertraline (ZOLOFT) 50 MG tablet Take 1 tablet (50 mg total) by mouth daily. 90 tablet 1   traZODone (DESYREL) 100 MG tablet TAKE 1 TABLET AT BEDTIME AS NEEDED FOR SLEEP 90 tablet 1   triamcinolone (KENALOG) 0.025 % cream Apply 1 Application topically 2 (two) times daily. 30 g 0   No facility-administered medications prior to visit.  Allergies  Allergen Reactions   Clindamycin/Lincomycin Nausea And Vomiting   Lyrica [Pregabalin] Other (See Comments)     PERSONALITY CHANGE    Review of Systems  Constitutional:  Negative for appetite change, fatigue and fever.  HENT:  Positive for congestion. Negative for ear pain, sinus pressure and sore throat.   Respiratory:  Positive for cough and shortness of breath. Negative for chest tightness and wheezing.   Cardiovascular:  Negative for chest pain and palpitations.  Gastrointestinal:  Negative for abdominal pain, constipation, diarrhea, nausea and vomiting.  Genitourinary:  Negative for dysuria and hematuria.  Musculoskeletal:  Negative for arthralgias, back pain, joint swelling and myalgias.  Skin:  Positive for rash (worse).  Neurological:  Negative for dizziness, weakness and headaches.  Psychiatric/Behavioral:  Negative for dysphoric mood. The patient is not nervous/anxious.        Objective:    Physical Exam Vitals reviewed.  HENT:     Right Ear: Tympanic membrane normal.     Left Ear: Tympanic membrane normal.     Nose: No rhinorrhea.     Mouth/Throat:     Pharynx: No oropharyngeal exudate or posterior oropharyngeal erythema.  Cardiovascular:     Rate and Rhythm: Normal rate and regular rhythm.     Heart sounds: Normal heart sounds.  Pulmonary:     Effort: Pulmonary effort is normal.     Breath sounds: Wheezing (mild diffuse) present.  Skin:    Findings: Erythema (papular rash in interosseous spaces/wrists BL.) present.  Neurological:     Mental Status: She is alert and oriented to person, place, and time.  Psychiatric:        Mood and Affect: Mood normal.        Behavior: Behavior normal.     BP 126/64   Pulse (!) 122   Temp (!) 97.5 F (36.4 C) (Temporal)   Ht _0  (1.549 m)   Wt 90 lb 12.8 oz (41.2 kg)   SpO2 96%   BMI 17.16 kg/m  Wt Readings from Last 3 Encounters:  01/03/22 90 lb 12.8 oz (41.2 kg)  12/27/21 90 lb 3.2 oz (40.9 kg)  12/25/21 89 lb 9.6 oz (40.6 kg)    Health Maintenance Due  Topic Date Due   Hepatitis C Screening  Never done   Zoster  Vaccines- Shingrix (1 of 2) Never done   Medicare Annual Wellness (AWV)  08/22/2019   INFLUENZA VACCINE  10/03/2021   COVID-19 Vaccine (3 - Moderna risk series) 11/26/2021    There are no preventive care reminders to display for this patient.   Lab Results  Component Value Date   TSH 0.360 (L) 01/03/2022   Lab Results  Component Value Date   WBC 11.6 (H) 01/03/2022   HGB 11.1 01/03/2022   HCT 33.0 (L) 01/03/2022   MCV 93 01/03/2022   PLT 300 01/03/2022   Lab Results  Component Value Date   NA 140 01/03/2022   K 3.8 01/03/2022   CO2 23 01/03/2022   GLUCOSE 140 (H) 01/03/2022   BUN 17 01/03/2022   CREATININE 0.68 01/03/2022   BILITOT 0.2 01/03/2022   ALKPHOS 79 01/03/2022   AST 18 01/03/2022   ALT 18 01/03/2022   PROT 5.8 (L) 01/03/2022   ALBUMIN 3.6 (L) 01/03/2022   CALCIUM 8.9 01/03/2022   ANIONGAP 3 (L) 07/03/2016   EGFR 91 01/03/2022   Lab Results  Component Value Date   CHOL 132 01/30/2021   Lab Results  Component Value Date  HDL 64 01/30/2021   Lab Results  Component Value Date   LDLCALC 45 01/30/2021   Lab Results  Component Value Date   TRIG 133 01/30/2021   Lab Results  Component Value Date   CHOLHDL 2.1 01/30/2021   No results found for: "HGBA1C"       Assessment & Plan:   Problem List Items Addressed This Visit       Cardiovascular and Mediastinum   Hypotension    Hold midodrine.  Push fluids.         Musculoskeletal and Integument   Scabies infestation    Permethin cream. Use as directed.  Take hydroxzyzine 25 mg up to three times a day as needed for itching and it can cause sleepiness so specifically try it at night.        Other   Tachycardia - Primary    EKG: Sinus tachycardia. Hydrate.  Hold midodrine.  Check bp and pulse daily. Follow up next week with VS.      Relevant Orders   CBC with Differential/Platelet (Completed)   Comprehensive metabolic panel (Completed)   TSH (Completed)   EKG 12-Lead (Completed)      Meds ordered this encounter  Medications   permethrin (ELIMITE) 5 % cream    Sig: Apply from neck down to feet before bed. Leave on for 8-14 hours. Shower off. Repeat in 1 week.    Dispense:  60 g    Refill:  0   Atogepant ([name]) 60 MG TABS    Sig: Take 60 mg by mouth daily.    Dispense:  30 tablet    Refill:  2    I,Lauren M Auman,acting as a scribe for [name], MD.,have documented all relevant documentation on the behalf of [name], MD,as directed by  [name], MD while in the presence of [name], MD.   Follow up: as needed.   [name], MD

## 2022-01-03 NOTE — Patient Instructions (Addendum)
Permethin cream. Use as directed.   Take hydroxzyzine 25 mg up to three times a day as needed for itching and it can cause sleepiness so specifically try it at night.  Hold midodrine.   Push fluids.

## 2022-01-04 LAB — CBC WITH DIFFERENTIAL/PLATELET
Basophils Absolute: 0 10*3/uL (ref 0.0–0.2)
Basos: 0 %
EOS (ABSOLUTE): 0 10*3/uL (ref 0.0–0.4)
Eos: 0 %
Hematocrit: 33 % — ABNORMAL LOW (ref 34.0–46.6)
Hemoglobin: 11.1 g/dL (ref 11.1–15.9)
Immature Grans (Abs): 0.1 10*3/uL (ref 0.0–0.1)
Immature Granulocytes: 1 %
Lymphocytes Absolute: 0.9 10*3/uL (ref 0.7–3.1)
Lymphs: 8 %
MCH: 31.2 pg (ref 26.6–33.0)
MCHC: 33.6 g/dL (ref 31.5–35.7)
MCV: 93 fL (ref 79–97)
Monocytes Absolute: 0.3 10*3/uL (ref 0.1–0.9)
Monocytes: 2 %
Neutrophils Absolute: 10.4 10*3/uL — ABNORMAL HIGH (ref 1.4–7.0)
Neutrophils: 89 %
Platelets: 300 10*3/uL (ref 150–450)
RBC: 3.56 x10E6/uL — ABNORMAL LOW (ref 3.77–5.28)
RDW: 12.2 % (ref 11.7–15.4)
WBC: 11.6 10*3/uL — ABNORMAL HIGH (ref 3.4–10.8)

## 2022-01-04 LAB — COMPREHENSIVE METABOLIC PANEL
ALT: 18 IU/L (ref 0–32)
AST: 18 IU/L (ref 0–40)
Albumin/Globulin Ratio: 1.6 (ref 1.2–2.2)
Albumin: 3.6 g/dL — ABNORMAL LOW (ref 3.8–4.8)
Alkaline Phosphatase: 79 IU/L (ref 44–121)
BUN/Creatinine Ratio: 25 (ref 12–28)
BUN: 17 mg/dL (ref 8–27)
Bilirubin Total: 0.2 mg/dL (ref 0.0–1.2)
CO2: 23 mmol/L (ref 20–29)
Calcium: 8.9 mg/dL (ref 8.7–10.3)
Chloride: 104 mmol/L (ref 96–106)
Creatinine, Ser: 0.68 mg/dL (ref 0.57–1.00)
Globulin, Total: 2.2 g/dL (ref 1.5–4.5)
Glucose: 140 mg/dL — ABNORMAL HIGH (ref 70–99)
Potassium: 3.8 mmol/L (ref 3.5–5.2)
Sodium: 140 mmol/L (ref 134–144)
Total Protein: 5.8 g/dL — ABNORMAL LOW (ref 6.0–8.5)
eGFR: 91 mL/min/{1.73_m2} (ref >59)

## 2022-01-04 LAB — TSH: TSH: 0.36 u[IU]/mL — ABNORMAL LOW (ref 0.450–4.500)

## 2022-01-04 NOTE — Progress Notes (Signed)
Blood count normal.  WBC is elevated but improved from a month ago.  This is likely due to her prednisone. Liver function normal.  Kidney function normal.  Thyroid function abnormal.  Recommend add a free T4. Please asked the patient how her blood pressure is today and her heart rate.  Let me know.

## 2022-01-05 ENCOUNTER — Other Ambulatory Visit: Payer: Self-pay | Admitting: Family Medicine

## 2022-01-07 ENCOUNTER — Encounter: Payer: Self-pay | Admitting: Family Medicine

## 2022-01-07 DIAGNOSIS — B86 Scabies: Secondary | ICD-10-CM | POA: Insufficient documentation

## 2022-01-07 DIAGNOSIS — R000 Tachycardia, unspecified: Secondary | ICD-10-CM | POA: Insufficient documentation

## 2022-01-07 NOTE — Assessment & Plan Note (Signed)
Hold midodrine.  Push fluids.

## 2022-01-07 NOTE — Assessment & Plan Note (Signed)
Permethin cream. Use as directed.  Take hydroxzyzine 25 mg up to three times a day as needed for itching and it can cause sleepiness so specifically try it at night.

## 2022-01-07 NOTE — Assessment & Plan Note (Addendum)
EKG: Sinus tachycardia. Hydrate.  Hold midodrine.  Check bp and pulse daily. Follow up next week with VS.

## 2022-01-08 ENCOUNTER — Encounter: Payer: Self-pay | Admitting: Family Medicine

## 2022-01-08 ENCOUNTER — Ambulatory Visit: Payer: Medicare HMO

## 2022-01-13 ENCOUNTER — Other Ambulatory Visit: Payer: Self-pay | Admitting: Family Medicine

## 2022-01-16 DIAGNOSIS — F17210 Nicotine dependence, cigarettes, uncomplicated: Secondary | ICD-10-CM | POA: Diagnosis not present

## 2022-01-16 DIAGNOSIS — Z122 Encounter for screening for malignant neoplasm of respiratory organs: Secondary | ICD-10-CM | POA: Diagnosis not present

## 2022-01-16 DIAGNOSIS — Z87891 Personal history of nicotine dependence: Secondary | ICD-10-CM | POA: Diagnosis not present

## 2022-01-22 DIAGNOSIS — F17210 Nicotine dependence, cigarettes, uncomplicated: Secondary | ICD-10-CM | POA: Diagnosis not present

## 2022-01-22 DIAGNOSIS — R918 Other nonspecific abnormal finding of lung field: Secondary | ICD-10-CM | POA: Diagnosis not present

## 2022-01-22 DIAGNOSIS — J4520 Mild intermittent asthma, uncomplicated: Secondary | ICD-10-CM | POA: Diagnosis not present

## 2022-01-24 ENCOUNTER — Ambulatory Visit: Payer: Medicare HMO | Admitting: Family Medicine

## 2022-02-06 DIAGNOSIS — R051 Acute cough: Secondary | ICD-10-CM | POA: Diagnosis not present

## 2022-02-12 ENCOUNTER — Other Ambulatory Visit: Payer: Self-pay | Admitting: Family Medicine

## 2022-02-14 ENCOUNTER — Ambulatory Visit (INDEPENDENT_AMBULATORY_CARE_PROVIDER_SITE_OTHER): Payer: Medicare HMO | Admitting: Family Medicine

## 2022-02-14 VITALS — BP 124/72 | HR 70 | Temp 97.4°F | Resp 16 | Ht 60.0 in | Wt 94.6 lb

## 2022-02-14 DIAGNOSIS — E440 Moderate protein-calorie malnutrition: Secondary | ICD-10-CM

## 2022-02-14 DIAGNOSIS — F330 Major depressive disorder, recurrent, mild: Secondary | ICD-10-CM

## 2022-02-14 DIAGNOSIS — R7989 Other specified abnormal findings of blood chemistry: Secondary | ICD-10-CM

## 2022-02-14 DIAGNOSIS — G43109 Migraine with aura, not intractable, without status migrainosus: Secondary | ICD-10-CM

## 2022-02-14 DIAGNOSIS — I700 Atherosclerosis of aorta: Secondary | ICD-10-CM

## 2022-02-14 DIAGNOSIS — Z853 Personal history of malignant neoplasm of breast: Secondary | ICD-10-CM | POA: Diagnosis not present

## 2022-02-14 DIAGNOSIS — J410 Simple chronic bronchitis: Secondary | ICD-10-CM | POA: Diagnosis not present

## 2022-02-14 DIAGNOSIS — K219 Gastro-esophageal reflux disease without esophagitis: Secondary | ICD-10-CM

## 2022-02-14 DIAGNOSIS — M810 Age-related osteoporosis without current pathological fracture: Secondary | ICD-10-CM | POA: Diagnosis not present

## 2022-02-14 DIAGNOSIS — I951 Orthostatic hypotension: Secondary | ICD-10-CM

## 2022-02-14 DIAGNOSIS — M5416 Radiculopathy, lumbar region: Secondary | ICD-10-CM | POA: Diagnosis not present

## 2022-02-14 DIAGNOSIS — Z1159 Encounter for screening for other viral diseases: Secondary | ICD-10-CM | POA: Diagnosis not present

## 2022-02-14 DIAGNOSIS — E538 Deficiency of other specified B group vitamins: Secondary | ICD-10-CM | POA: Diagnosis not present

## 2022-02-14 DIAGNOSIS — F17200 Nicotine dependence, unspecified, uncomplicated: Secondary | ICD-10-CM

## 2022-02-14 DIAGNOSIS — E43 Unspecified severe protein-calorie malnutrition: Secondary | ICD-10-CM

## 2022-02-14 MED ORDER — CYANOCOBALAMIN 1000 MCG/ML IJ SOLN
1000.0000 ug | Freq: Once | INTRAMUSCULAR | Status: AC
  Administered 2022-02-14: 1000 ug via INTRAMUSCULAR

## 2022-02-14 MED ORDER — HYDROCODONE-ACETAMINOPHEN 5-325 MG PO TABS: 1.0000 | ORAL_TABLET | Freq: Every day | ORAL | 0 refills | Status: DC | PRN

## 2022-02-14 NOTE — Progress Notes (Signed)
Subjective:  Patient ID: Anne [name], female    DOB: [birth year]  Age: 76 y.o. MRN: [id number]  Chief Complaint  Patient presents with   vitamin b12 deficiency   Osteoporosis    HPI COPD: trelegy one inhalation daily. Albuterol 2 puffs four times a day as needed. Off all oxygen.  B12 deficiency: B12 shots monthly. Has helped.  Headaches: 2-3 migraines per week. Taking hydrocodone for these. Patient was on qlipta. She is unsure if she is taking it. On amitriptyline 75 mg before bed.  Insomnia: mirtazepine 45 mg before bed. Trazodone 100 mg before bed as needed. Sleeping well.  GERD: Currently on omeprazole 40 mg one oral twice daily. Also on famotidine 20 mg twice daily  Weight has greatly improved. Megace 40 mg daily.  Back pain: on gabapentin 300 mg 2 capsules three times a day. Meloxicam 15 mg daily. Has norflex 100 mg one oral twice daily. No falls.  HYPERTENSION; used to be on antihypertensive medicines. Has needed midodrine 2.5 mg three times a day with meals.   Depression: zoloft 50 mg once daily, Quetiapine 50 mg one before bed.      02/14/2022   10:09 AM 10/19/2021   10:52 AM 10/05/2021    9:50 AM  PHQ9 SCORE ONLY  PHQ-9 Total Score _0 Current Outpatient Medications on File Prior to Visit  Medication Sig Dispense Refill   albuterol (PROVENTIL) (2.5 MG/3ML) 0.083% nebulizer solution Take 2.5 mg by nebulization 4 (four) times daily.     albuterol (VENTOLIN HFA) 108 (90 Base) MCG/ACT inhaler INHALE 1 TO 2 PUFFS BY MOUTH EVERY 6 HOURS AS NEEDED FOR WHEEZING OR SHORTNESS OF BREATH 18 g 0   amitriptyline (ELAVIL) 75 MG tablet TAKE 1 TABLET (75 MG TOTAL) DAILY. 90 tablet 1   Atogepant ([name]) 60 MG TABS Take 60 mg by mouth daily. 30 tablet 2   cetirizine (ZYRTEC) 10 MG tablet Take 1 tablet (10 mg total) by mouth daily. 30 tablet 11   cyanocobalamin (VITAMIN B12) 1000 MCG/ML injection INJECT 1ML INTO THE MUSCLE EVERY 30 DAYS. 3 mL 0   famotidine (PEPCID) 20 MG tablet  Take 1 tablet (20 mg total) by mouth 2 (two) times daily. 60 tablet 1   Fluticasone-Umeclidin-Vilant (TRELEGY ELLIPTA) 200-62.5-25 MCG/ACT AEPB Inhale 1 puff into the lungs daily. 3 each 3   gabapentin (NEURONTIN) 300 MG capsule TAKE 2 CAPSULES THREE TIMES DAILY 180 capsule 2   megestrol (MEGACE) 20 MG tablet Take 2 tablets (40 mg total) by mouth daily. 180 tablet 1   melatonin 10 MG TABS Take 20 mg by mouth at bedtime. 1 tablet 0   meloxicam (MOBIC) 15 MG tablet TAKE 1 TABLET EVERY DAY 90 tablet 1   midodrine (PROAMATINE) 2.5 MG tablet Take 1 tablet (2.5 mg total) by mouth 3 (three) times daily with meals. 270 tablet 1   mirtazapine (REMERON) 45 MG tablet TAKE 1 TABLET AT BEDTIME 90 tablet 3   montelukast (SINGULAIR) 10 MG tablet Take 10 mg by mouth daily.     omeprazole (PRILOSEC) 40 MG capsule Take 1 capsule (40 mg total) by mouth 2 (two) times daily. 180 capsule 1   orphenadrine (NORFLEX) 100 MG tablet Take 1 tablet (100 mg total) by mouth 2 (two) times daily as needed. 180 tablet 0   QUEtiapine (SEROQUEL) 50 MG tablet TAKE 1 TABLET BY MOUTH AT BEDTIME 90 tablet 0   rizatriptan (MAXALT-MLT) 10 MG disintegrating tablet DISSOLVE 1 TABLET  ON THE TONGUE AT ONSET OF MIGRAINE, REPEAT IN 2 HOURS IF NEEDED 10 tablet 2   sertraline (ZOLOFT) 50 MG tablet Take 1 tablet (50 mg total) by mouth daily. 90 tablet 1   traZODone (DESYREL) 100 MG tablet TAKE 1 TABLET AT BEDTIME AS NEEDED FOR SLEEP 90 tablet 1   triamcinolone (KENALOG) 0.025 % cream Apply 1 Application topically 2 (two) times daily. 30 g 0   NICOTINE STEP 1 21 MG/24HR patch PLACE ONE PATCH onto THE SKIN DAILY (Patient not taking: Reported on 02/14/2022) 30 patch 0   No current facility-administered medications on file prior to visit.   Past Medical History:  Diagnosis Date   Acquired absence of both breasts and nipples 06/08/2015   Acute infection of nasal sinus 05/04/2021   Aortic atherosclerosis 01/30/2021   Asthma    daily inhalers    Ataxia 10/18/2020   Bilateral carpal tunnel syndrome 01/30/2021   Bipolar disorder, mixed 06/16/2021   Breast cancer of upper-inner quadrant of left female breast 04/15/2015   Carotid artery disease 05/04/2016   Mild, 1-39% bilaterally by doppler [name] 2018   Chronic pain in right shoulder 12/25/2020   COPD with acute exacerbation 07/03/2016   Dental crowns present    Drug induced constipation 04/10/2021   EIC (epidermal inclusion cyst) 03/21/2021   Family history of breast cancer in female 04/26/2015   Dx. Niece at 40; dx. Maternal aunt in her late 8s  Formatting of this note might be different from the original. Overview:  Dx. Niece at 60; dx. Maternal aunt in her late 96s   Family history of colon cancer 04/26/2015   Dx. In maternal grandfather in his late 60s-70  Formatting of this note might be different from the original. Overview:  Dx. In maternal grandfather in his late 53s-70   Frequent falls 10/18/2020   Genetic testing 06/02/2015   Negative for pathogenic mutations within any of 20 genes on the Breast/Ovarian Cancer Panel through [employer].  No variants of uncertain significance (VUSes) were found.  The Breast/Ovarian Cancer Panel offered by [facility] Hope Pigeon, MD) includes sequencing and deletion/duplication analysis for the following 19 genes:  ATM, BARD1, BRCA1, BRCA2, BRIP1, CDH1, CHEK2, FANCC, M   History of basal cell carcinoma 04/26/2015   History of concussion 01/01/2014   History of kidney stones    History of TIA (transient ischemic attack) 10/2014   Hyperlipidemia    Insomnia    Lumbar back pain 10/18/2020   Major depressive disorder 10/18/2020   Malrotation of intestine 09/23/2017   Migraine aura without headache 02/08/2014   Migraine headache 01/30/2021   Nasal congestion 05/26/2015   will finish z-pak 05/27/2015   Nonproductive cough 05/26/2015   PONV (postoperative nausea and vomiting)    "long time ago"  none recently    Psychogenic syncope 04/23/2019   Right lumbar radiculopathy 04/23/2019   RLS (restless legs syndrome)    Simple chronic bronchitis 07/04/2019   Tobacco use    Tremor 10/18/2020   Vitamin D deficiency    Wears dentures    Weight loss 08/28/2019   Past Surgical History:  Procedure Laterality Date   ABDOMINAL HYSTERECTOMY     APPENDECTOMY  2000   BREAST BIOPSY Left    BREAST RECONSTRUCTION WITH PLACEMENT OF TISSUE EXPANDER AND FLEX HD (ACELLULAR HYDRATED DERMIS) Bilateral 06/02/2015   Procedure: BILATERAL BREAST RECONSTRUCTION WITH PLACEMENT OF TISSUE EXPANDER AND  ACELLULAR HYDRATED DERMIS;  Surgeon: Irene Limbo, MD;  Location: [facility];  Service: Clinical cytogeneticist;  Laterality: Bilateral;   CARPAL TUNNEL RELEASE Bilateral    CATARACT EXTRACTION Bilateral 09/2013   KNEE ARTHROSCOPY Left    LAPAROSCOPIC ABDOMINAL EXPLORATION N/A 10/2017   LAPAROSCOPIC LYSIS OF CONGENITAL ADHESIVE BAND/LADD'S BANDS WIH INTRAOPERATIVE CHOLANGIOGRAPHY. Perforation of small intestion requiring repair.    LOOP RECORDER INSERTION N/A 05/11/2016   Procedure: Loop Recorder Insertion;  Surgeon: Will Meredith Leeds, MD;  Location: [facility];  Service: Cardiovascular;  Laterality: N/A;   MASTECTOMY W/ SENTINEL NODE BIOPSY Bilateral 06/02/2015   Procedure: BILATERAL MASTECTOMY WITH LEFT SENTINEL LYMPH NODE BIOPSY;  Surgeon: Stark Klein, MD;  Location: [facility];  Service: General;  Laterality: Bilateral;   NASAL SEPTUM SURGERY     x 3   PLANTAR FASCIA SURGERY Left    REMOVAL OF BILATERAL TISSUE EXPANDERS WITH PLACEMENT OF BILATERAL BREAST IMPLANTS Bilateral 06/17/2015   Procedure: DEBRIDEMENT OF BILATERAL MASTECTOMY FLAP WITH BILATERAL TISSUE EXPANDER EXCHANGE ;  Surgeon: Irene Limbo, MD;  Location: [facility];  Service: Plastics;  Laterality: Bilateral;   REMOVAL OF TISSUE EXPANDER Bilateral 07/12/2015   Procedure: REMOVAL OF BILATERAL TISSUE EXPANDERS;  Surgeon: Irene Limbo, MD;   Location: [facility];  Service: Plastics;  Laterality: Bilateral;    Family History  Problem Relation Age of Onset   Cervical cancer Mother 9   Colon cancer Maternal Grandfather        mets to stomach; dx. 67-70   Heart disease Father    Prostate cancer Father 7   Cervical cancer Maternal Grandmother        dx. 26s; treated with radium implant   Lung cancer Sister 45       maternal half-sister dx. lung cancer, stage III   Breast cancer Maternal Aunt    Cervical cancer Sister 28       paternal half-sister; s/p TAH   Spina bifida Grandchild    Brain cancer Grandchild        grandson dx. at 20 mos, treated at [facility] Other 45       maternal half-sister's daughter   Cancer Maternal Aunt        d. 45; unspecified type - "began at back area and moved to vital organs"   Cancer Maternal Uncle         late 74s; unspecified type; "started at back and moved to vital organs"   Social History   Socioeconomic History   Marital status: Widowed    Spouse name: Not on file   Number of children: 1   Years of education: 83   Highest education level: Not on file  Occupational History    Comment: [employer], retired  Tobacco Use   Smoking status: Every Day    Packs/day: 0.15    Types: Cigarettes    Last attempt to quit: 03/21/2020    Years since quitting: 1.9   Smokeless tobacco: Never  Vaping Use   Vaping Use: Never used  Substance and Sexual Activity   Alcohol use: Yes    Alcohol/week: 0.0 standard drinks of alcohol    Comment: rarely   Drug use: No   Sexual activity: Not on file  Other Topics Concern   Not on file  Social History Narrative   Lives alone, widow   Caffeine use- tea, 1 cup daily   Right Handed    Lives in a one story [place] home    Social Determinants of Health  Financial Resource Strain: Low Risk  (02/17/2021)   Overall Financial Resource Strain (CARDIA)    Difficulty of Paying Living Expenses: Not hard at all  Food  Insecurity: Not on file  Transportation Needs: No Transportation Needs (02/17/2021)   PRAPARE - Hydrologist (Medical): No    Lack of Transportation (Non-Medical): No  Physical Activity: Not on file  Stress: Not on file  Social Connections: Not on file    Review of Systems  Constitutional:  Negative for chills, fatigue and fever.  HENT:  Negative for congestion, rhinorrhea and sore throat.   Respiratory:  Negative for cough and shortness of breath.   Cardiovascular:  Negative for chest pain.  Gastrointestinal:  Negative for abdominal pain, constipation, diarrhea, nausea and vomiting.  Genitourinary:  Negative for dysuria and urgency.  Musculoskeletal:  Positive for back pain. Negative for myalgias.  Neurological:  Positive for light-headedness and headaches. Negative for dizziness and weakness.  Psychiatric/Behavioral:  Negative for dysphoric mood. The patient is not nervous/anxious.      Objective:  BP 124/72   Pulse 70   Temp (!) 97.4 F (36.3 C)   Resp 16   Ht 5' (1.524 m)   Wt 94 lb 9.6 oz (42.9 kg)   BMI 18.48 kg/m      02/14/2022   10:03 AM 01/03/2022    3:45 PM 01/03/2022    3:26 PM  BP/Weight  Systolic BP 314 970 263  Diastolic BP 72 64 86  Wt. (Lbs) 94.6  90.8  BMI 18.48 kg/m2  17.16 kg/m2    Physical Exam Vitals reviewed.  Constitutional:      Appearance: Normal appearance.     Comments: Thin, but improving.   Neck:     Vascular: No carotid bruit.  Cardiovascular:     Rate and Rhythm: Normal rate and regular rhythm.     Heart sounds: Normal heart sounds.  Pulmonary:     Effort: Pulmonary effort is normal.     Breath sounds: Wheezing present.  Abdominal:     General: Bowel sounds are normal.     Palpations: Abdomen is soft.     Tenderness: There is no abdominal tenderness.  Neurological:     Mental Status: She is alert and oriented to person, place, and time.  Psychiatric:        Mood and Affect: Mood normal.         Behavior: Behavior normal.    Diabetic Foot Exam - Simple   No data filed      Lab Results  Component Value Date   WBC 11.6 (H) 01/03/2022   HGB 11.1 01/03/2022   HCT 33.0 (L) 01/03/2022   PLT 300 01/03/2022   GLUCOSE 140 (H) 01/03/2022   CHOL 132 01/30/2021   TRIG 133 01/30/2021   HDL 64 01/30/2021   LDLCALC 45 01/30/2021   ALT 18 01/03/2022   AST 18 01/03/2022   NA 140 01/03/2022   K 3.8 01/03/2022   CL 104 01/03/2022   CREATININE 0.68 01/03/2022   BUN 17 01/03/2022   CO2 23 01/03/2022   TSH 0.360 (L) 01/03/2022      Assessment & Plan:   Problem List Items Addressed This Visit       Cardiovascular and Mediastinum   Migraine aura without headache    Migraines: Please check if taking qlipta. If not, please pick up prescription.       Relevant Medications   HYDROcodone-acetaminophen (NORCO/VICODIN) 5-325 MG tablet  Aortic atherosclerosis    Needs to return fasting for labs.      Relevant Orders   Lipid panel   RESOLVED: Orthostatic hypotension     Respiratory   Simple chronic bronchitis     trelegy one inhalation daily. Albuterol 2 puffs four times a day as needed. Off all oxygen.  Quit smoking.        Digestive   GERD (gastroesophageal reflux disease)    Heartburn: Decrease omeprazole to 40 mg once daily x 2 weeks, then discontinue. Continue famotidine 20 mg twice daily. If reflux recurs, restart omeprazole.         Nervous and Auditory   Right lumbar radiculopathy   Relevant Medications   HYDROcodone-acetaminophen (NORCO/VICODIN) 5-325 MG tablet     Musculoskeletal and Integument   Age-related osteoporosis without current pathological fracture    Check on dexa.        Other   Severe protein-calorie malnutrition Altamease Oiler: less than 60% of standard weight) ([place])    Greatly improved.  Consider decrease megace at next appt.       Mild recurrent major depression (HCC)    The current medical regimen is effective;  continue present plan and  medications.      Personal history of breast cancer   Tobacco dependency    Recommend quit smoking.      Vitamin B12 deficiency    Continue b1 shots monthly.       Relevant Orders   Vitamin B12   Methylmalonic acid, serum   Other Visit Diagnoses     Moderate protein-calorie malnutrition ([place])    -  Primary   Relevant Orders   CBC with Differential/Platelet   VITAMIN D 25 Hydroxy (Vit-D Deficiency, Fractures)   Abnormal TSH       Relevant Orders   T4, free   TSH   Encounter for hepatitis C screening test for low risk patient       Relevant Orders   HCV Ab w Reflex to Quant PCR     .  Meds ordered this encounter  Medications   HYDROcodone-acetaminophen (NORCO/VICODIN) 5-325 MG tablet    Sig: Take 1 tablet by mouth daily as needed for severe pain (migraines).    Dispense:  30 tablet    Refill:  0    Please call for appt in November fasting.   cyanocobalamin (VITAMIN B12) injection 1,000 mcg    Orders Placed This Encounter  Procedures   CBC with Differential/Platelet   Lipid panel   VITAMIN D 25 Hydroxy (Vit-D Deficiency, Fractures)   Vitamin B12   Methylmalonic acid, serum   T4, free   TSH   HCV Ab w Reflex to Quant PCR     Follow-up: Return in about 3 months (around 05/16/2022) for chronic follow up.  An After Visit Summary was printed and given to the patient.  [name], MD [facility] [phone]

## 2022-02-14 NOTE — Patient Instructions (Addendum)
Migraines: Please check if taking qlipta. If not, please pick up prescription.   Heartburn: Decrease omeprazole to 40 mg once daily x 2 weeks, then discontinue. Continue famotidine 20 mg twice daily. If reflux recurs, restart omeprazole.   For Low BP:  Decrease midodrine to 2.5 mg twice daily x 3 weeks, then if systolic bp > 868, may decrease midodrine to once daily 4 weeks then if systolic bp > 257 and then discontinue it.   Recommend quit smoking.  Have a happy holiday! Merry Christmas!  Dr. Tobie Poet

## 2022-02-18 ENCOUNTER — Encounter: Payer: Self-pay | Admitting: Family Medicine

## 2022-02-18 DIAGNOSIS — K219 Gastro-esophageal reflux disease without esophagitis: Secondary | ICD-10-CM | POA: Insufficient documentation

## 2022-02-18 NOTE — Assessment & Plan Note (Signed)
Greatly improved.  Consider decrease megace at next appt.

## 2022-02-18 NOTE — Assessment & Plan Note (Signed)
The current medical regimen is effective;  continue present plan and medications.  

## 2022-02-18 NOTE — Assessment & Plan Note (Signed)
Heartburn: Decrease omeprazole to 40 mg once daily x 2 weeks, then discontinue. Continue famotidine 20 mg twice daily. If reflux recurs, restart omeprazole.

## 2022-02-18 NOTE — Assessment & Plan Note (Signed)
Migraines: Please check if taking qlipta. If not, please pick up prescription.

## 2022-02-18 NOTE — Assessment & Plan Note (Signed)
Recommend quit smoking.

## 2022-02-18 NOTE — Assessment & Plan Note (Signed)
Continue b1 shots monthly.

## 2022-02-18 NOTE — Assessment & Plan Note (Addendum)
Needs to return fasting for labs.

## 2022-02-18 NOTE — Assessment & Plan Note (Signed)
For Low BP:  Decrease midodrine to 2.5 mg twice daily x 3 weeks, then if systolic bp > 826, may decrease midodrine to once daily 4 weeks then if systolic bp > 415 and then discontinue it.

## 2022-02-18 NOTE — Assessment & Plan Note (Signed)
trelegy one inhalation daily. Albuterol 2 puffs four times a day as needed. Off all oxygen.  Quit smoking.

## 2022-02-18 NOTE — Assessment & Plan Note (Signed)
Check on dexa.

## 2022-02-18 NOTE — Assessment & Plan Note (Signed)
>>  ASSESSMENT AND PLAN FOR MILD RECURRENT MAJOR DEPRESSION (HCC) WRITTEN ON 02/18/2022  9:52 PM BY COX, KIRSTEN, MD  The current medical regimen is effective;  continue present plan and medications.

## 2022-02-19 ENCOUNTER — Other Ambulatory Visit: Payer: Self-pay | Admitting: Family Medicine

## 2022-02-19 ENCOUNTER — Telehealth: Payer: Self-pay

## 2022-02-19 NOTE — Telephone Encounter (Signed)
-----   Message from [name], MD sent at 02/18/2022  9:45 PM EST ----- Regarding: needs labs Patient needs fasting labs. Please ask her to come fasting and schedule cbc, cmp, flp, tsh, free t4. Also needs a follow up appt in 3 months fasting.

## 2022-02-19 NOTE — Telephone Encounter (Addendum)
Called Patient left voicemail for patient to call office and schedule 3 Month fasting appointment, and also nurse visit for fasting labs.      ----- Message from [name], MD sent at 02/18/2022  9:45 PM EST ----- Regarding: needs labs Patient needs fasting labs. Please ask her to come fasting and schedule cbc, cmp, flp, tsh, free t4. Also needs a follow up appt in 3 months fasting.

## 2022-02-20 ENCOUNTER — Ambulatory Visit: Payer: Medicare HMO

## 2022-02-20 DIAGNOSIS — E538 Deficiency of other specified B group vitamins: Secondary | ICD-10-CM | POA: Diagnosis not present

## 2022-02-20 DIAGNOSIS — E440 Moderate protein-calorie malnutrition: Secondary | ICD-10-CM | POA: Diagnosis not present

## 2022-02-20 DIAGNOSIS — Z1159 Encounter for screening for other viral diseases: Secondary | ICD-10-CM | POA: Diagnosis not present

## 2022-02-20 DIAGNOSIS — R7989 Other specified abnormal findings of blood chemistry: Secondary | ICD-10-CM | POA: Diagnosis not present

## 2022-02-20 DIAGNOSIS — I700 Atherosclerosis of aorta: Secondary | ICD-10-CM | POA: Diagnosis not present

## 2022-02-21 ENCOUNTER — Telehealth: Payer: Self-pay

## 2022-02-21 LAB — CBC WITH DIFFERENTIAL/PLATELET
Basophils Absolute: 0.1 10*3/uL (ref 0.0–0.2)
Basos: 1 %
EOS (ABSOLUTE): 0.2 10*3/uL (ref 0.0–0.4)
Eos: 3 %
Hematocrit: 40.4 % (ref 34.0–46.6)
Hemoglobin: 13.1 g/dL (ref 11.1–15.9)
Immature Grans (Abs): 0 10*3/uL (ref 0.0–0.1)
Immature Granulocytes: 0 %
Lymphocytes Absolute: 1.9 10*3/uL (ref 0.7–3.1)
Lymphs: 27 %
MCH: 30.4 pg (ref 26.6–33.0)
MCHC: 32.4 g/dL (ref 31.5–35.7)
MCV: 94 fL (ref 79–97)
Monocytes Absolute: 0.7 10*3/uL (ref 0.1–0.9)
Monocytes: 9 %
Neutrophils Absolute: 4.2 10*3/uL (ref 1.4–7.0)
Neutrophils: 60 %
Platelets: 235 10*3/uL (ref 150–450)
RBC: 4.31 x10E6/uL (ref 3.77–5.28)
RDW: 11.6 % — ABNORMAL LOW (ref 11.7–15.4)
WBC: 7 10*3/uL (ref 3.4–10.8)

## 2022-02-21 LAB — LIPID PANEL
Chol/HDL Ratio: 3.1 ratio (ref 0.0–4.4)
Cholesterol, Total: 181 mg/dL (ref 100–199)
HDL: 59 mg/dL (ref >39)
LDL Chol Calc (NIH): 101 mg/dL — ABNORMAL HIGH (ref 0–99)
Triglycerides: 121 mg/dL (ref 0–149)
VLDL Cholesterol Cal: 21 mg/dL (ref 5–40)

## 2022-02-21 LAB — T4, FREE: Free T4: 0.95 ng/dL (ref 0.82–1.77)

## 2022-02-21 LAB — CARDIOVASCULAR RISK ASSESSMENT

## 2022-02-21 LAB — VITAMIN B12: Vitamin B-12: 816 pg/mL (ref 232–1245)

## 2022-02-21 LAB — TSH: TSH: 1.7 u[IU]/mL (ref 0.450–4.500)

## 2022-02-21 LAB — VITAMIN D 25 HYDROXY (VIT D DEFICIENCY, FRACTURES): Vit D, 25-Hydroxy: 20.7 ng/mL — ABNORMAL LOW (ref 30.0–100.0)

## 2022-02-21 NOTE — Chronic Care Management (AMB) (Signed)
Chronic Care Management Pharmacy Assistant   Name: Anne [name]  MRN: [id number] DOB: 08-03-1945  Reason for Encounter: Medication Review/ Medication coordination  Recent office visits:  02-14-2022 Anne Brome, MD. RDW= 11.6. LDL= 101. Vit D= 20.7. Bone density and  MM DIGITAL SCREENING BILATERAL completed. STOP mycelex, hydroxyzine, elimite, prednisone. B12 given.  01-03-2022 Anne Maxwell, MD. WBC= 11.6, RBC= 3.56, Hema= 33.0, Neutrophils= 10.4. Glucose= 140, Total protein= 5.8, Albumin= 3.6. TSH= 0.360. EKG completed. START atogepant 60 mg daily and Permethrin Apply from neck down to feet before bed. Leave on for 8-14 hours. Shower off. Repeat in 1 week.   12-27-2021 Anne Harbour, NP. START hydroxyzine 25 mg every 8 hours PRN and kenelog cream twice daily.  12-25-2021 Anne Brome, MD. Referral placed to pulmonology. START levaquin 500 mg daily, prednisone 10 mg daily. STOP augmentin and tessalon.   12-12-2021 Anne [name], CMA. B12 injection given.  12-11-2021 Anne Brome, MD. START Augmentin 875-125 mg twice daily, tessalon 200 mg three times daily PRN, mycelex 10 mg 5 times daily, prednisone 20 mg Take 3 tablets (60 mg total) by mouth daily with breakfast for 3 days, THEN 2 tablets (40 mg total) daily with breakfast for 3 days, THEN 1 tablet (20 mg total) daily with breakfast for 3 days. STOP doxycycline and duoneb.  12-06-2021 Anne [name], CMA. B12 injection given.  11-30-2021 Anne Brome, MD. Positive covid test. START Paxlovid Take 3 tablets by mouth 2 (two) times daily for 5 days. (Take nirmatrelvir 150 mg two tablets twice daily for 5 days and ritonavir 100 mg one tablet twice daily for 5 days). STOP augmentin and zithromax.  Recent consult visits:  None  Hospital visits:  None in previous 6 months  Medications: Outpatient Encounter Medications as of 02/21/2022  Medication Sig   albuterol (PROVENTIL) (2.5 MG/3ML) 0.083% nebulizer solution Take 2.5 mg  by nebulization 4 (four) times daily.   albuterol (VENTOLIN HFA) 108 (90 Base) MCG/ACT inhaler INHALE 1 TO 2 PUFFS BY MOUTH EVERY 6 HOURS AS NEEDED FOR WHEEZING OR SHORTNESS OF BREATH   amitriptyline (ELAVIL) 75 MG tablet TAKE 1 TABLET (75 MG TOTAL) DAILY.   Atogepant ([name]) 60 MG TABS Take 60 mg by mouth daily.   cetirizine (ZYRTEC) 10 MG tablet Take 1 tablet (10 mg total) by mouth daily.   cyanocobalamin (VITAMIN B12) 1000 MCG/ML injection INJECT 1ML INTO THE MUSCLE EVERY 30 DAYS.   famotidine (PEPCID) 20 MG tablet Take 1 tablet (20 mg total) by mouth 2 (two) times daily.   Fluticasone-Umeclidin-Vilant (TRELEGY ELLIPTA) 200-62.5-25 MCG/ACT AEPB Inhale 1 puff into the lungs daily.   gabapentin (NEURONTIN) 300 MG capsule TAKE TWO CAPSULES BY MOUTH THREE TIMES DAILY   HYDROcodone-acetaminophen (NORCO/VICODIN) 5-325 MG tablet Take 1 tablet by mouth daily as needed for severe pain (migraines).   megestrol (MEGACE) 20 MG tablet Take 2 tablets (40 mg total) by mouth daily.   melatonin 10 MG TABS Take 20 mg by mouth at bedtime.   meloxicam (MOBIC) 15 MG tablet TAKE 1 TABLET EVERY DAY   midodrine (PROAMATINE) 2.5 MG tablet Take 1 tablet (2.5 mg total) by mouth 3 (three) times daily with meals.   mirtazapine (REMERON) 45 MG tablet TAKE 1 TABLET AT BEDTIME   montelukast (SINGULAIR) 10 MG tablet Take 10 mg by mouth daily.   NICOTINE STEP 1 21 MG/24HR patch PLACE ONE PATCH onto THE SKIN DAILY (Patient not taking: Reported on 02/14/2022)   omeprazole (PRILOSEC) 40 MG  capsule Take 1 capsule (40 mg total) by mouth 2 (two) times daily.   orphenadrine (NORFLEX) 100 MG tablet Take 1 tablet (100 mg total) by mouth 2 (two) times daily as needed.   QUEtiapine (SEROQUEL) 50 MG tablet TAKE 1 TABLET BY MOUTH AT BEDTIME   rizatriptan (MAXALT-MLT) 10 MG disintegrating tablet DISSOLVE 1 TABLET ON THE TONGUE AT ONSET OF MIGRAINE, REPEAT IN 2 HOURS IF NEEDED   sertraline (ZOLOFT) 50 MG tablet TAKE ONE TABLET BY MOUTH  ONCE DAILY   traZODone (DESYREL) 100 MG tablet TAKE 1 TABLET AT BEDTIME AS NEEDED FOR SLEEP   triamcinolone (KENALOG) 0.025 % cream Apply 1 Application topically 2 (two) times daily.   No facility-administered encounter medications on file as of 02/21/2022.  Reviewed chart for medication changes ahead of medication coordination call.  No medication changes indicated   BP Readings from Last 3 Encounters:  02/14/22 124/72  01/03/22 126/64  12/27/21 (!) 142/82    No results found for: "HGBA1C"   Patient obtains medications through Adherence Packaging  90 Days   Last adherence delivery included:  Cetirizine 10 mg- 1 tablet at breakfast  Trelegy Gabapentin 300 mg- 2 at breakfast, 2 at lunch, 2 at bedtime Tamoxifen 20 mg- 1 tablet at bedtime Quetiapine 100 mg- 0.5 mg at bedtime Megestrol Ac 20 mg- 2 tablets before breakfast  Sertraline 50 mg- 1 tablet at breakfast  Trazodone 100 mg- 1 at bedtime Omeprazole 40 mg- 1 at breakfast and 1 with evening meal Meloxicam 15 mg- 1 at breakfast Mirtazapine 45 mg- 1 at breakfast  Patient declined (meds) last delivery:  Amitriptyline- Patient received 11-01-2021 90 DS [employer] (Requested patient to D/C future delivery from [place]. Requested RX from PCP for next delivery) Midodrine- Patient received 10-19-2021 90 DS [employer] (Requested patient to D/C future delivery from [place]. Requested RX from PCP for next delivery) Norflex- Patient received 10-10-2021 90 DS [employer] (Requested patient to D/C future delivery from [place]. Requested RX from PCP for next delivery) Prednisone, Amoxicillin and zithromax- Completed course  Patient is due for next adherence delivery on: 03-07-2022  Called patient and reviewed medications and coordinated delivery.  This delivery to include: Cetirizine 10 mg- 1 tablet at breakfast  Trelegy Gabapentin 300 mg- 2 at breakfast, 2 at lunch, 2 at bedtime Quetiapine 100 mg- 0.5 mg at bedtime  Megestrol Ac 20 mg- 2 tablets  before breakfast   Amitriptyline 75 mg at bedtime Sertraline 50 mg- 1 tablet at breakfast  Trazodone 100 mg- 1 at bedtime Omeprazole 40 mg- 1 at breakfast and 1 with evening meal Meloxicam 15 mg- 1 at breakfast Mirtazapine 45 mg- 1 at breakfast Midodrine 2.5 mg 1 at breakfast, 1 at lunch, 1 with evening meals Norflex 10 mg twice daily PRN (Vial)  No short/acute fill needed  Patient declined the following medications: Unable to reach patient  Patient needs refills for: None  Confirmed delivery date of 03-07-2021,  pharmacy will contact them the morning of delivery.  Care Gaps: Hep C screening overdue Shingrix overdue AWV overdue Flu vaccine overdue Covid booster overdue  Star Rating Drugs: None  Jeannette How [facility] Clinical Pharmacist Assistant [phone]

## 2022-02-28 ENCOUNTER — Other Ambulatory Visit: Payer: Self-pay | Admitting: Nurse Practitioner

## 2022-02-28 DIAGNOSIS — L509 Urticaria, unspecified: Secondary | ICD-10-CM

## 2022-03-07 ENCOUNTER — Other Ambulatory Visit: Payer: Self-pay | Admitting: Family Medicine

## 2022-03-08 ENCOUNTER — Ambulatory Visit (INDEPENDENT_AMBULATORY_CARE_PROVIDER_SITE_OTHER): Payer: Medicare HMO

## 2022-03-08 VITALS — BP 112/70 | HR 72 | Resp 18 | Ht 60.0 in | Wt 94.6 lb

## 2022-03-08 DIAGNOSIS — Z0000 Encounter for general adult medical examination without abnormal findings: Secondary | ICD-10-CM

## 2022-03-08 NOTE — Patient Instructions (Addendum)
Anne [name] , Thank you for taking time to come for your Medicare Wellness Visit. I appreciate your ongoing commitment to your health goals. Please review the following plan we discussed and let me know if I can assist you in the future.   Screening recommendations/referrals: Colonoscopy: Due 2026 Bone Density: will reschedule Recommended yearly ophthalmology/optometry visit for glaucoma screening and checkup Recommended yearly dental visit for hygiene and checkup  Vaccinations: Influenza vaccine: up-to-date Pneumococcal vaccine: up-to-date Tdap vaccine: up-to-date   Preventive Care 76 Years and Older, Female   Preventive care refers to lifestyle choices and visits with your health care provider that can promote health and wellness.  What does preventive care include? A yearly physical exam. This is also called an annual well check. Dental exams once or twice a year. Routine eye exams. Ask your health care provider how often you should have your eyes checked. Personal lifestyle choices, including: Daily care of your teeth and gums. Regular physical activity. Eating a healthy diet. Avoiding tobacco and drug use. Limiting alcohol use. Practicing safe sex. Taking low-dose aspirin every day. Taking vitamin and mineral supplements as recommended by your health care provider.  What happens during an annual well check? The services and screenings done by your health care provider during your annual well check will depend on your age, overall health, lifestyle risk factors, and family history of disease.  Counseling Your health care provider may ask you questions about your: Alcohol use. Tobacco use. Drug use. Emotional well-being. Home and relationship well-being. Sexual activity. Eating habits. History of falls. Memory and ability to understand (cognition). Work and work Statistician. Reproductive health.  Screening You may have the following tests or measurements: Height,  weight, and BMI. Blood pressure. Lipid and cholesterol levels. These may be checked every 5 years, or more frequently if you are over 64 years old. Skin check. Lung cancer screening. You may have this screening every year starting at age 65 if you have a 30-pack-year history of smoking and currently smoke or have quit within the past 15 years. Fecal occult blood test (FOBT) of the stool. You may have this test every year starting at age 68. Flexible sigmoidoscopy or colonoscopy. You may have a sigmoidoscopy every 5 years or a colonoscopy every 10 years starting at age 20. Hepatitis C blood test. Hepatitis B blood test. Sexually transmitted disease (STD) testing. Diabetes screening. This is done by checking your blood sugar (glucose) after you have not eaten for a while (fasting). You may have this done every 1-3 years. Bone density scan. This is done to screen for osteoporosis. You may have this done starting at age 74. Mammogram. This may be done every 1-2 years. Talk to your health care provider about how often you should have regular mammograms. Talk with your health care provider about your test results, treatment options, and if necessary, the need for more tests.  Vaccines Your health care provider may recommend certain vaccines, such as: Influenza vaccine. This is recommended every year. Tetanus, diphtheria, and acellular pertussis (Tdap, Td) vaccine. You may need a Td booster every 10 years. Zoster vaccine. You may need this after age 46. Pneumococcal 13-valent conjugate (PCV13) vaccine. One dose is recommended after age 43. Pneumococcal polysaccharide (PPSV23) vaccine. One dose is recommended after age 102. Talk to your health care provider about which screenings and vaccines you need and how often you need them.  This information is not intended to replace advice given to you by your health care provider.  Make sure you discuss any questions you have with your health care  provider. Document Released: 03/18/2015 Document Revised: 11/09/2015 Document Reviewed: 12/21/2014 Elsevier Interactive Patient Education  2017 [place] Prevention in the Home  Falls can cause injuries. They can happen to people of all ages. There are many things you can do to make your home safe and to help prevent falls.  What can I do on the outside of my home? Regularly fix the edges of walkways and driveways and fix any cracks. Remove anything that might make you trip as you walk through a door, such as a raised step or threshold. Trim any bushes or trees on the path to your home. Use bright outdoor lighting. Clear any walking paths of anything that might make someone trip, such as rocks or tools. Regularly check to see if handrails are loose or broken. Make sure that both sides of any steps have handrails. Any raised decks and porches should have guardrails on the edges. Have any leaves, snow, or ice cleared regularly. Use sand or salt on walking paths during winter. Clean up any spills in your garage right away. This includes oil or grease spills.  What can I do in the bathroom? Use night lights. Install grab bars by the toilet and in the tub and shower. Do not use towel bars as grab bars. Use non-skid mats or decals in the tub or shower. If you need to sit down in the shower, use a plastic, non-slip stool. Keep the floor dry. Clean up any water that spills on the floor as soon as it happens. Remove soap buildup in the tub or shower regularly. Attach bath mats securely with double-sided non-slip rug tape. Do not have throw rugs and other things on the floor that can make you trip.  What can I do in the bedroom? Use night lights. Make sure that you have a light by your bed that is easy to reach. Do not use any sheets or blankets that are too big for your bed. They should not hang down onto the floor. Have a firm chair that has side arms. You can use this for  support while you get dressed. Do not have throw rugs and other things on the floor that can make you trip.  What can I do in the kitchen? Clean up any spills right away. Avoid walking on wet floors. Keep items that you use a lot in easy-to-reach places. If you need to reach something above you, use a strong step stool that has a grab bar. Keep electrical cords out of the way. Do not use floor polish or wax that makes floors slippery. If you must use wax, use non-skid floor wax. Do not have throw rugs and other things on the floor that can make you trip.  What can I do with my stairs? Do not leave any items on the stairs. Make sure that there are handrails on both sides of the stairs and use them. Fix handrails that are broken or loose. Make sure that handrails are as long as the stairways. Check any carpeting to make sure that it is firmly attached to the stairs. Fix any carpet that is loose or worn. Avoid having throw rugs at the top or bottom of the stairs. If you do have throw rugs, attach them to the floor with carpet tape. Make sure that you have a light switch at the top of the stairs and the bottom of the  stairs. If you do not have them, ask someone to add them for you.  What else can I do to help prevent falls? Wear shoes that: Do not have high heels. Have rubber bottoms. Are comfortable and fit you well. Are closed at the toe. Do not wear sandals. If you use a stepladder: Make sure that it is fully opened. Do not climb a closed stepladder. Make sure that both sides of the stepladder are locked into place. Ask someone to hold it for you, if possible. Clearly mark and make sure that you can see: Any grab bars or handrails. First and last steps. Where the edge of each step is. Use tools that help you move around (mobility aids) if they are needed. These include: Canes. Walkers. Scooters. Crutches. Turn on the lights when you go into a dark area. Replace any light bulbs as  soon as they burn out. Set up your furniture so you have a clear path. Avoid moving your furniture around. If any of your floors are uneven, fix them. If there are any pets around you, be aware of where they are. Review your medicines with your doctor. Some medicines can make you feel dizzy. This can increase your chance of falling. Ask your doctor what other things that you can do to help prevent falls.  Smoking and Musculoskeletal Health Smoking is bad for your health. Most people know that smoking causes lung disease, heart disease, and cancer. But people may not realize that it also affects their bones, muscles, and joints (musculoskeletal system). When you smoke, the effects on your lungs and heart result in less oxygen for your musculoskeletal system. This can lead to poor bone and joint health. How can smoking affect my musculoskeletal health? Smoking can: Increase your risk of having weak, thin bones (osteoporosis). Elderly smokers are at higher risk for bone fractures related to osteoporosis. Decrease the ability of bone-forming cells to make and replace bone (in addition to reducing oxygen and blood flow). Reduce your body's ability to absorb calcium from your diet. Less calcium means weaker bones. Interfere with the breakdown of the female hormone estrogen. Smoking lowers estrogen, which is a hormone that helps keep bones strong. Women who smoke may have earlier menopause. Menopause is a risk factor for osteoporosis. Weaken the tissues that attach bones to muscles (tendons). This can lead to shoulder, back, and other joint injuries. Increase your risk of rheumatoid arthritis or make the condition worse if you already have it. Slow down healing and increase your risk of infection and other complications if you have a bone fracture or surgery that involves your musculoskeletal system. What actions can I take to prevent musculoskeletal problems? Quit smoking     Do not use any products  that contain nicotine or tobacco. These products include cigarettes, chewing tobacco, and vaping devices, such as e-cigarettes. These can delay bone healing. If you need help quitting, ask your health care provider. Even stopping later in life can improve musculoskeletal health. Do not replace cigarette smoking with e-cigarettes. The safety of e-cigarettes is not known, and some may contain harmful chemicals. Make a plan to quit smoking and commit to it. Look for programs to help you, and ask your health care provider for recommendations and ideas. Talk with your health care provider about using nicotine replacement medicines to help you quit, such as gum, lozenges, patches, sprays, or pills. Make other lifestyle changes  Eat a healthy diet that includes calcium and vitamin D. These nutrients are  important for bone health. Get plenty of exercise. Weight-bearing and strength exercises are best for musculoskeletal health. Ask your health care provider what type of exercise is safe for you. If you drink alcohol: Limit how much you have to: 0-1 drink a day for women who are not pregnant. 0-2 drinks a day for men. Know how much alcohol is in your drink. In the [place] one drink equals one 12 oz bottle of beer (355 mL), one 5 oz glass of wine (148 mL), or one 1 oz glass of hard liquor (44 mL). Where to find more information You may find more information about the effects of smoking on musculoskeletal health, and quitting smoking from: [place] [employer]: orthoinfo.aaos.[place], Osteoporosis and Related [place]: bones.SouthExposed.es Contact a health care provider if: You need help to quit smoking. Summary When you smoke, the effects on your lungs and heart result in less oxygen for your musculoskeletal system. Even stopping smoking later in life can improve musculoskeletal health. Do not use any products that contain nicotine or  tobacco, such as cigarettes and e-cigarettes. If you need help quitting, ask your health care provider. This information is not intended to replace advice given to you by your health care provider. Make sure you discuss any questions you have with your health care provider. Document Revised: 11/17/2020 Document Reviewed: 11/17/2020 Elsevier Patient Education  [place] of Quitting Smoking Quitting smoking is a physical and mental challenge. You may have cravings, withdrawal symptoms, and temptation to smoke. Before quitting, work with your health care provider to make a plan that can help you manage quitting. Making a plan before you quit may keep you from smoking when you have the urge to smoke while trying to quit. How to manage lifestyle changes Managing stress Stress can make you want to smoke, and wanting to smoke may cause stress. It is important to find ways to manage your stress. You could try some of the following: Practice relaxation techniques. Breathe slowly and deeply, in through your nose and out through your mouth. Listen to music. Soak in a bath or take a shower. Imagine a peaceful place or vacation. Get some support. Talk with family or friends about your stress. Join a support group. Talk with a counselor or therapist. Get some physical activity. Go for a walk, run, or bike ride. Play a favorite sport. Practice yoga.  Medicines Talk with your health care provider about medicines that might help you deal with cravings and make quitting easier for you. Relationships Social situations can be difficult when you are quitting smoking. To manage this, you can: Avoid parties and other social situations where people might be smoking. Avoid alcohol. Leave right away if you have the urge to smoke. Explain to your family and friends that you are quitting smoking. Ask for support and let them know you might be a bit grumpy. Plan activities where  smoking is not an option. General instructions Be aware that many people gain weight after they quit smoking. However, not everyone does. To keep from gaining weight, have a plan in place before you quit, and stick to the plan after you quit. Your plan should include: Eating healthy snacks. When you have a craving, it may help to: Eat popcorn, or try carrots, celery, or other cut vegetables. Chew sugar-free gum. Changing how you eat. Eat small portion sizes at meals. Eat 4-6 small meals throughout the day  instead of 1-2 large meals a day. Be mindful when you eat. You should avoid watching television or doing other things that might distract you as you eat. Exercising regularly. Make time to exercise each day. If you do not have time for a long workout, do short bouts of exercise for 5-10 minutes several times a day. Do some form of strengthening exercise, such as weight lifting. Do some exercise that gets your heart beating and causes you to breathe deeply, such as walking fast, running, swimming, or biking. This is very important. Drinking plenty of water or other low-calorie or no-calorie drinks. Drink enough fluid to keep your urine pale yellow.  How to recognize withdrawal symptoms Your body and mind may experience discomfort as you try to get used to not having nicotine in your system. These effects are called withdrawal symptoms. They may include: Feeling hungrier than normal. Having trouble concentrating. Feeling irritable or restless. Having trouble sleeping. Feeling depressed. Craving a cigarette. These symptoms may surprise you, but they are normal to have when quitting smoking. To manage withdrawal symptoms: Avoid places, people, and activities that trigger your cravings. Remember why you want to quit. Get plenty of sleep. Avoid coffee and other drinks that contain caffeine. These may worsen some of your symptoms. How to manage cravings Come up with a plan for how to deal  with your cravings. The plan should include the following: A definition of the specific situation you want to deal with. An activity or action you will take to replace smoking. A clear idea for how this action will help. The name of someone who could help you with this. Cravings usually last for 5-10 minutes. Consider taking the following actions to help you with your plan to deal with cravings: Keep your mouth busy. Chew sugar-free gum. Suck on hard candies or a straw. Brush your teeth. Keep your hands and body busy. Change to a different activity right away. Squeeze or play with a ball. Do an activity or a hobby, such as making bead jewelry, practicing needlepoint, or working with wood. Mix up your normal routine. Take a short exercise break. Go for a quick walk, or run up and down stairs. Focus on doing something kind or helpful for someone else. Call a friend or family member to talk during a craving. Join a support group. Contact a quitline. Where to find support To get help or find a support group: Call the [facility]'s Smoking Quitline: 1-800-QUIT-NOW 609-552-2648) Text QUIT to SmokefreeTXT: 353614 Where to find more information Visit these websites to find more information on quitting smoking: U.S. Department of Health and Human Services: www.smokefree.gov American Lung Association: www.freedomfromsmoking.org Centers for Disease Control and Prevention (CDC): http://www.wolf.info/ American Heart Association: www.heart.org Contact a health care provider if: You want to change your plan for quitting. The medicines you are taking are not helping. Your eating feels out of control or you cannot sleep. You feel depressed or become very anxious. Summary Quitting smoking is a physical and mental challenge. You will face cravings, withdrawal symptoms, and temptation to smoke again. Preparation can help you as you go through these challenges. Try different techniques to manage  stress, handle social situations, and prevent weight gain. You can deal with cravings by keeping your mouth busy (such as by chewing gum), keeping your hands and body busy, calling family or friends, or contacting a quitline for people who want to quit smoking. You can deal with withdrawal symptoms by avoiding places where people  smoke, getting plenty of rest, and avoiding drinks that contain caffeine.  This information is not intended to replace advice given to you by your health care provider. Make sure you discuss any questions you have with your health care provider. Document Released: 12/16/2008 Document Revised: 07/28/2015 Document Reviewed: 03/26/2014 Elsevier Interactive Patient Education  2017 [employer].

## 2022-03-08 NOTE — Progress Notes (Signed)
Subjective:   Anne [name] is a 77 y.o. female who presents for Medicare Annual (Subsequent) preventive examination.  This wellness visit is conducted by a nurse.  The patient's medications were reviewed and reconciled since the patient's last visit.  History details were provided by the patient.  The history appears to be reliable.    Medical History: Patient history and Family history was reviewed  Medications, Allergies, and preventative health maintenance was reviewed and updated.  Cardiac Risk Factors include: smoking/ tobacco exposure;advanced age (>73mn, >>25women)     Objective:    Today's Vitals   03/08/22 1352  BP: 112/70  Pulse: 72  Resp: 18  Weight: 94 lb 9.6 oz (42.9 kg)  Height: 5' (1.524 m)   Body mass index is 18.48 kg/m.     03/08/2021   10:17 AM 07/03/2016   12:40 AM 05/11/2016    3:00 PM 07/04/2015    1:13 PM 06/02/2015    6:39 AM 05/26/2015   12:08 PM 12/02/2014    1:58 PM  Advanced Directives  Does Patient Have a Medical Advance Directive? Yes _0  No  Type of [place];Out of facility DNR (pink MOST or yellow form)        Would patient like information on creating a medical advance directive?  No - Patient declined Yes (MAU/Ambulatory/Procedural Areas - Information given)  No - patient declined information No - patient declined information Yes - Educational materials given    Current Medications (verified) Outpatient Encounter Medications as of 03/08/2022  Medication Sig   albuterol (PROVENTIL) (2.5 MG/3ML) 0.083% nebulizer solution Take 2.5 mg by nebulization 4 (four) times daily.   albuterol (VENTOLIN HFA) 108 (90 Base) MCG/ACT inhaler INHALE 1-2 PUFFS BY MOUTH into THE lungs EVERY 6 HOURS AS NEEDED FOR WHEEZING AND/OR SHORTNESS OF BREATH   amitriptyline (ELAVIL) 75 MG tablet TAKE 1 TABLET (75 MG TOTAL) DAILY.   Atogepant ([name]) 60 MG TABS Take 60 mg by mouth daily.   cetirizine (ZYRTEC) 10 MG  tablet Take 1 tablet (10 mg total) by mouth daily.   cyanocobalamin (VITAMIN B12) 1000 MCG/ML injection INJECT 1ML INTO THE MUSCLE EVERY 30 DAYS.   famotidine (PEPCID) 20 MG tablet Take 1 tablet by mouth twice daily   Fluticasone-Umeclidin-Vilant (TRELEGY ELLIPTA) 200-62.5-25 MCG/ACT AEPB Inhale 1 puff into the lungs daily.   gabapentin (NEURONTIN) 300 MG capsule TAKE TWO CAPSULES BY MOUTH THREE TIMES DAILY   HYDROcodone-acetaminophen (NORCO/VICODIN) 5-325 MG tablet Take 1 tablet by mouth daily as needed for severe pain (migraines).   megestrol (MEGACE) 20 MG tablet Take 2 tablets (40 mg total) by mouth daily.   melatonin 10 MG TABS Take 20 mg by mouth at bedtime.   meloxicam (MOBIC) 15 MG tablet TAKE 1 TABLET EVERY DAY   midodrine (PROAMATINE) 2.5 MG tablet Take 1 tablet (2.5 mg total) by mouth 3 (three) times daily with meals.   mirtazapine (REMERON) 45 MG tablet TAKE 1 TABLET AT BEDTIME   montelukast (SINGULAIR) 10 MG tablet Take 10 mg by mouth daily.   NICOTINE STEP 1 21 MG/24HR patch PLACE ONE PATCH onto THE SKIN DAILY (Patient not taking: Reported on 02/14/2022)   omeprazole (PRILOSEC) 40 MG capsule Take 1 capsule (40 mg total) by mouth 2 (two) times daily.   orphenadrine (NORFLEX) 100 MG tablet Take 1 tablet (100 mg total) by mouth 2 (two) times daily as needed.   QUEtiapine (SEROQUEL) 50 MG tablet TAKE  1 TABLET BY MOUTH AT BEDTIME   rizatriptan (MAXALT-MLT) 10 MG disintegrating tablet DISSOLVE 1 TABLET ON THE TONGUE AT ONSET OF MIGRAINE, REPEAT IN 2 HOURS IF NEEDED   sertraline (ZOLOFT) 50 MG tablet TAKE ONE TABLET BY MOUTH ONCE DAILY   traZODone (DESYREL) 100 MG tablet TAKE 1 TABLET AT BEDTIME AS NEEDED FOR SLEEP   triamcinolone (KENALOG) 0.025 % cream Apply 1 Application topically 2 (two) times daily.   No facility-administered encounter medications on file as of 03/08/2022.    Allergies (verified) Clindamycin/lincomycin and Lyrica [pregabalin]   History: Past Medical History:   Diagnosis Date   Acquired absence of both breasts and nipples 06/08/2015   Acute infection of nasal sinus 05/04/2021   Aortic atherosclerosis 01/30/2021   Asthma    daily inhalers   Ataxia 10/18/2020   Bilateral carpal tunnel syndrome 01/30/2021   Bipolar disorder, mixed 06/16/2021   Breast cancer of upper-inner quadrant of left female breast 04/15/2015   Carotid artery disease 05/04/2016   Mild, 1-39% bilaterally by doppler in March 2018   Chronic pain in right shoulder 12/25/2020   COPD with acute exacerbation 07/03/2016   Dental crowns present    Drug induced constipation 04/10/2021   EIC (epidermal inclusion cyst) 03/21/2021   Family history of breast cancer in female 04/26/2015   Dx. Niece at 29; dx. Maternal aunt in her late 52s  Formatting of this note might be different from the original. Overview:  Dx. Niece at 31; dx. Maternal aunt in her late 28s   Family history of colon cancer 04/26/2015   Dx. In maternal grandfather in his late 78s-70  Formatting of this note might be different from the original. Overview:  Dx. In maternal grandfather in his late 79s-70   Frequent falls 10/18/2020   Genetic testing 06/02/2015   Negative for pathogenic mutations within any of 20 genes on the Breast/Ovarian Cancer Panel through [employer].  No variants of uncertain significance (VUSes) were found.  The Breast/Ovarian Cancer Panel offered by [facility] Hope Pigeon, MD) includes sequencing and deletion/duplication analysis for the following 19 genes:  ATM, BARD1, BRCA1, BRCA2, BRIP1, CDH1, CHEK2, FANCC, M   History of basal cell carcinoma 04/26/2015   History of concussion 01/01/2014   History of kidney stones    History of TIA (transient ischemic attack) 10/2014   Hyperlipidemia    Insomnia    Lumbar back pain 10/18/2020   Major depressive disorder 10/18/2020   Malrotation of intestine 09/23/2017   Migraine aura without headache 02/08/2014   Migraine headache  01/30/2021   Nasal congestion 05/26/2015   will finish z-pak 05/27/2015   Nonproductive cough 05/26/2015   PONV (postoperative nausea and vomiting)    "long time ago"  none recently   Psychogenic syncope 04/23/2019   Right lumbar radiculopathy 04/23/2019   RLS (restless legs syndrome)    Simple chronic bronchitis 07/04/2019   Tobacco use    Tremor 10/18/2020   Vitamin D deficiency    Wears dentures    Weight loss 08/28/2019   Past Surgical History:  Procedure Laterality Date   ABDOMINAL HYSTERECTOMY     APPENDECTOMY  2000   BREAST BIOPSY Left    BREAST RECONSTRUCTION WITH PLACEMENT OF TISSUE EXPANDER AND FLEX HD (ACELLULAR HYDRATED DERMIS) Bilateral 06/02/2015   Procedure: BILATERAL BREAST RECONSTRUCTION WITH PLACEMENT OF TISSUE EXPANDER AND  ACELLULAR HYDRATED DERMIS;  Surgeon: Irene Limbo, MD;  Location: [facility];  Service: Plastics;  Laterality: Bilateral;   CARPAL TUNNEL  RELEASE Bilateral    CATARACT EXTRACTION Bilateral 09/2013   KNEE ARTHROSCOPY Left    LAPAROSCOPIC ABDOMINAL EXPLORATION N/A 10/2017   LAPAROSCOPIC LYSIS OF CONGENITAL ADHESIVE BAND/LADD'S BANDS WIH INTRAOPERATIVE CHOLANGIOGRAPHY. Perforation of small intestion requiring repair.    LOOP RECORDER INSERTION N/A 05/11/2016   Procedure: Loop Recorder Insertion;  Surgeon: Will Meredith Leeds, MD;  Location: [facility];  Service: Cardiovascular;  Laterality: N/A;   MASTECTOMY W/ SENTINEL NODE BIOPSY Bilateral 06/02/2015   Procedure: BILATERAL MASTECTOMY WITH LEFT SENTINEL LYMPH NODE BIOPSY;  Surgeon: Stark Klein, MD;  Location: [facility];  Service: General;  Laterality: Bilateral;   NASAL SEPTUM SURGERY     x 3   PLANTAR FASCIA SURGERY Left    REMOVAL OF BILATERAL TISSUE EXPANDERS WITH PLACEMENT OF BILATERAL BREAST IMPLANTS Bilateral 06/17/2015   Procedure: DEBRIDEMENT OF BILATERAL MASTECTOMY FLAP WITH BILATERAL TISSUE EXPANDER EXCHANGE ;  Surgeon: Irene Limbo, MD;   Location: [facility];  Service: Plastics;  Laterality: Bilateral;   REMOVAL OF TISSUE EXPANDER Bilateral 07/12/2015   Procedure: REMOVAL OF BILATERAL TISSUE EXPANDERS;  Surgeon: Irene Limbo, MD;  Location: [facility];  Service: Plastics;  Laterality: Bilateral;   Family History  Problem Relation Age of Onset   Cervical cancer Mother 32   Colon cancer Maternal Grandfather        mets to stomach; dx. 67-70   Heart disease Father    Prostate cancer Father 43   Cervical cancer Maternal Grandmother        dx. 26s; treated with radium implant   Lung cancer Sister 58       maternal half-sister dx. lung cancer, stage III   Breast cancer Maternal Aunt    Cervical cancer Sister 65       paternal half-sister; s/p TAH   Spina bifida Grandchild    Brain cancer Grandchild        grandson dx. at 20 mos, treated at [facility] Other 87       maternal half-sister's daughter   Cancer Maternal Aunt        d. 24; unspecified type - "began at back area and moved to vital organs"   Cancer Maternal Uncle         late 60s; unspecified type; "started at back and moved to vital organs"   Social History   Socioeconomic History   Marital status: Widowed    Spouse name: Not on file   Number of children: 1   Years of education: 21   Highest education level: Not on file  Occupational History    Comment: [employer], retired  Tobacco Use   Smoking status: Every Day    Packs/day: 0.25    Types: Cigarettes   Smokeless tobacco: Never   Tobacco comments:    Currently wearing nicotine patches  Vaping Use   Vaping Use: Never used  Substance and Sexual Activity   Alcohol use: Never   Drug use: No   Sexual activity: Not Currently  Other Topics Concern   Not on file  Social History Narrative   Lives alone, widow   Caffeine use- tea, 1 cup daily   Right Handed    Lives in [place] one story [place] home    Social Determinants of Health   Financial Resource Strain: Low Risk   (02/17/2021)   Overall Financial Resource Strain (CARDIA)    Difficulty of Paying Living Expenses: Not hard at all  Food Insecurity:  Food Insecurity Present (03/08/2022)   Hunger Vital Sign    Worried About Running Out of Food in the Last Year: Never true    Ran Out of Food in the Last Year: Sometimes true  Transportation Needs: No Transportation Needs (03/08/2022)   PRAPARE - Hydrologist (Medical): No    Lack of Transportation (Non-Medical): No  Physical Activity: Inactive (03/08/2022)   Exercise Vital Sign    Days of Exercise per Week: 0 days    Minutes of Exercise per Session: 0 min  Stress: Stress Concern Present (03/08/2022)   [place]    Feeling of Stress : To some extent  Social Connections: Not on file    Tobacco Counseling Ready to quit: Yes Counseling given: No Tobacco comments: Currently wearing nicotine patches   Clinical Intake:  Pre-visit preparation completed: Yes Pain : No/denies pain   BMI - recorded: 18.48 Nutritional Status: BMI <19  Underweight Diabetes: No How often do you need to have someone help you when you read instructions, pamphlets, or other written materials from your doctor or pharmacy?: 1 - Never Interpreter Needed?: No   Activities of Daily Living    03/08/2022    2:55 PM  In your present state of health, do you have any difficulty performing the following activities:  Hearing? 0  Vision? 0  Difficulty concentrating or making decisions? 0  Walking or climbing stairs? 0  Dressing or bathing? 0  Doing errands, shopping? 0  Preparing Food and eating ? N  Using the Toilet? N  In the past six months, have you accidently leaked urine? N  Do you have problems with loss of bowel control? N  Managing your Medications? N  Managing your Finances? N  Housekeeping or managing your Housekeeping? N    Patient Care Team: [name], MD as PCP - General  (Internal Medicine) Debara Pickett Nadean Corwin, MD as PCP - Cardiology (Cardiology) Alda Berthold, DO as Consulting Physician (Neurology) Gardiner Rhyme, MD as Referring Physician (Specialist)  Indicate any recent Medical Services you may have received from other than [facility] providers in the past year (date may be approximate).     Assessment:   This is a routine wellness examination for Fani.  Hearing/Vision screen No results found.  Dietary issues and exercise activities discussed: Current Exercise Habits: The patient does not participate in regular exercise at present, Exercise limited by: None identified   Goals Addressed   None    Depression Screen    03/08/2022    2:48 PM 02/14/2022   10:09 AM 10/19/2021   10:52 AM 10/05/2021    9:50 AM 07/11/2021    3:36 PM 06/01/2021    3:53 PM 12/25/2020    3:50 PM  PHQ 2/9 Scores  PHQ - 2 Score _0 PHQ- 9 Score _1 Fall Risk    03/08/2022    2:55 PM 02/14/2022   10:08 AM 10/19/2021   10:51 AM 10/04/2021   10:11 AM 04/10/2021    2:42 PM  Fall Risk   Falls in the past year? 1 1 0 1 1  Number falls in past yr: 1 1 0 1 1  Injury with Fall? 0 1 0 0 1  Risk for fall due to : History of fall(s) History of fall(s) History of fall(s) History of fall(s)   Follow up Falls  evaluation completed;Education provided Falls evaluation completed Falls evaluation completed Falls evaluation completed Falls evaluation completed    FALL RISK PREVENTION PERTAINING TO THE HOME:  Any stairs in or around the home? Yes  If so, are there any without handrails? No  Home free of loose throw rugs in walkways, pet beds, electrical cords, etc? Yes  Adequate lighting in your home to reduce risk of falls? Yes   ASSISTIVE DEVICES UTILIZED TO PREVENT FALLS:  Life alert? No  Use of a cane, walker or w/c? No  Gait steady and fast without use of assistive device  Cognitive Function:    09/20/2021    2:46 PM 02/14/2021   10:50 AM 10/13/2020     2:51 PM 10/03/2020    8:39 AM 04/14/2020    9:54 AM  MMSE - Mini Mental State Exam  Orientation to time _0 Orientation to time comments  2022, Winter, 12-12, Monday     Orientation to Place _1 Orientation to Place-comments  Dr Gabriel Carina, [address], [place], [place], [place]     Registration _2 Registration-comments  [place], [place], [place]     Attention/ Calculation _3 Attention/Calculation-comments  Spell WORLD backwards: [facility]     Recall _4 Recall-comments  [place], [place], ?     Language- name 2 objects _5 Language- name 2 objects-comments  [place], [place]     Language- repeat _6 Language- repeat-comments  No If's, and's or But's     Language- follow 3 step command _7 Language- follow 3 step command-comments  Pick paper up with right hand (used her left), fold in half and place on bed.     Language- read & follow direction _8 Language-read & follow direction-comments  "Close your eyes."     Write a sentence _9 Write a sentence-comments  "I love my son!"     Copy design _10 Total score _11 03/08/2021   11:00 AM  Montreal Cognitive Assessment   Visuospatial/ Executive (0/5) 2  Naming (0/3) 3  Attention: Read list of digits (0/2) 2  Attention: Read list of letters (0/1) 1  Attention: Serial 7 subtraction starting at 100 (0/3) 3  Language: Repeat phrase (0/2) 1  Language : Fluency (0/1) 0  Abstraction (0/2) 0  Delayed Recall (0/5) 3  Orientation (0/6) 6  Total 21  Adjusted Score (based on education) 22      03/08/2022    2:56 PM  6CIT Screen  What Year? 0 points  What month? 0 points  What time? 0 points  Count back from 20 0 points  Months in reverse 0 points  Repeat phrase 0 points  Total Score 0 points    Immunizations Immunization History  Administered Date(s) Administered   Influenza-Unspecified 11/04/2018, 12/07/2019, 02/05/2022   Moderna SARS-COV2 Booster Vaccination 03/01/2020,  10/29/2021   Moderna Sars-Covid-2 Vaccination 05/06/2019, 06/03/2019   PNEUMOCOCCAL CONJUGATE-20 12/16/2020   Pneumococcal Conjugate-13 09/02/2013   Pneumococcal Polysaccharide-23 12/06/2012, 12/10/2016   Respiratory Syncytial Virus Vaccine,Recomb Aduvanted(Arexvy) 10/29/2021   Tdap 09/25/2015   Zoster Recombinat (Shingrix) 02/05/2022   Zoster, Live 08/03/2013    TDAP status: Up to date  Flu Vaccine status: Up to  date  Pneumococcal vaccine status: Up to date  Covid-19 vaccine status: Completed vaccines  Qualifies for Shingles Vaccine? Yes   Zostavax completed Yes   Shingrix Completed?: No.    Education has been provided regarding the importance of this vaccine. Patient has been advised to call insurance company to determine out of pocket expense if they have not yet received this vaccine. Advised may also receive vaccine at local pharmacy or Health Dept. Verbalized acceptance and understanding.  Screening Tests Health Maintenance  Topic Date Due   Hepatitis C Screening  Never done   Medicare Annual Wellness (AWV)  08/22/2019   COVID-19 Vaccine (3 - Moderna risk series) 11/26/2021   Zoster Vaccines- Shingrix (2 of 2) 04/02/2022   Lung Cancer Screening  05/30/2022   COLONOSCOPY (Pts 45-18yr Insurance coverage will need to be confirmed)  04/16/2024   DTaP/Tdap/Td (2 - Td or Tdap) 09/24/2025   Pneumonia Vaccine 77 Years old  Completed   INFLUENZA VACCINE  Completed   DEXA SCAN  Completed   HPV VACCINES  Aged Out    Health Maintenance  Health Maintenance Due  Topic Date Due   Hepatitis C Screening  Never done   Medicare Annual Wellness (AWV)  08/22/2019   COVID-19 Vaccine (3 - Moderna risk series) 11/26/2021    Colorectal cancer screening: Type of screening: Colonoscopy. Completed 2016. Repeat every 10 years  Mammogram status: No longer required due to BL mastectomy .  Bone Density status: Completed 02/2015. Results reflect: Bone density results: OSTEOPENIA. Repeat  every 2 years.  Lung Cancer Screening: (Low Dose CT Chest recommended if Age 76-80years, 30 pack-year currently smoking OR have quit w/in 15years.) does qualify.   Lung Cancer Screening Referral: Due after 05/30/22  Additional Screening:  Vision Screening: Recommended annual ophthalmology exams for early detection of glaucoma and other disorders of the eye. Is the patient up to date with their annual eye exam?  Yes   Dental Screening: Recommended annual dental exams for proper oral hygiene  Community Resource Referral / Chronic Care Management: CRR required this visit?  No   CCM required this visit?  No      Plan:    1- Reschedule DEXA Scan 2- CT Lung Cancer Screening due 05/2022 3- Start taking calcium + D 4- Continue making strides to stop smoking  I have personally reviewed and noted the following in the patient's chart:   Medical and social history Use of alcohol, tobacco or illicit drugs  Current medications and supplements including opioid prescriptions.  Functional ability and status Nutritional status Physical activity Advanced directives List of other physicians Hospitalizations, surgeries, and ER visits in previous 12 months Vitals Screenings to include cognitive, depression, and falls Referrals and appointments  In addition, I have reviewed and discussed with patient certain preventive protocols, quality metrics, and best practice recommendations. A written personalized care plan for preventive services as well as general preventive health recommendations were provided to patient.     KErie Noe LPN   01/10/4943

## 2022-03-12 ENCOUNTER — Other Ambulatory Visit: Payer: Self-pay

## 2022-03-12 DIAGNOSIS — E559 Vitamin D deficiency, unspecified: Secondary | ICD-10-CM

## 2022-03-12 MED ORDER — VITAMIN D (ERGOCALCIFEROL) 1.25 MG (50000 UNIT) PO CAPS: 50000.0000 [IU] | ORAL_CAPSULE | ORAL | 1 refills | Status: DC

## 2022-03-13 ENCOUNTER — Other Ambulatory Visit: Payer: Self-pay | Admitting: Family Medicine

## 2022-03-13 DIAGNOSIS — E538 Deficiency of other specified B group vitamins: Secondary | ICD-10-CM

## 2022-03-14 ENCOUNTER — Ambulatory Visit: Payer: Medicare HMO

## 2022-03-14 ENCOUNTER — Ambulatory Visit (INDEPENDENT_AMBULATORY_CARE_PROVIDER_SITE_OTHER): Payer: Medicare HMO

## 2022-03-14 DIAGNOSIS — E538 Deficiency of other specified B group vitamins: Secondary | ICD-10-CM | POA: Diagnosis not present

## 2022-03-14 MED ORDER — CYANOCOBALAMIN 1000 MCG/ML IJ SOLN
1000.0000 ug | Freq: Once | INTRAMUSCULAR | Status: AC
  Administered 2022-03-14: 1000 ug via INTRAMUSCULAR

## 2022-03-15 ENCOUNTER — Other Ambulatory Visit: Payer: Self-pay

## 2022-03-15 MED ORDER — OMEPRAZOLE 40 MG PO CPDR: 40.0000 mg | DELAYED_RELEASE_CAPSULE | Freq: Two times a day (BID) | ORAL | 1 refills | Status: DC

## 2022-03-15 MED ORDER — SERTRALINE HCL 50 MG PO TABS: 50.0000 mg | ORAL_TABLET | Freq: Every day | ORAL | 1 refills | Status: DC

## 2022-03-15 MED ORDER — MEGESTROL ACETATE 20 MG PO TABS: 40.0000 mg | ORAL_TABLET | Freq: Every day | ORAL | 1 refills | Status: DC

## 2022-03-15 MED ORDER — MELOXICAM 15 MG PO TABS: 15.0000 mg | ORAL_TABLET | Freq: Every day | ORAL | 1 refills | Status: DC

## 2022-03-19 ENCOUNTER — Other Ambulatory Visit: Payer: Self-pay

## 2022-03-19 MED ORDER — MELOXICAM 15 MG PO TABS: 15.0000 mg | ORAL_TABLET | Freq: Every day | ORAL | 0 refills | Status: DC

## 2022-03-19 MED ORDER — SERTRALINE HCL 50 MG PO TABS: 50.0000 mg | ORAL_TABLET | Freq: Every day | ORAL | 0 refills | Status: DC

## 2022-03-19 MED ORDER — OMEPRAZOLE 40 MG PO CPDR: 40.0000 mg | DELAYED_RELEASE_CAPSULE | Freq: Two times a day (BID) | ORAL | 0 refills | Status: DC

## 2022-03-19 MED ORDER — MEGESTROL ACETATE 20 MG PO TABS: 40.0000 mg | ORAL_TABLET | Freq: Every day | ORAL | 0 refills | Status: DC

## 2022-03-20 ENCOUNTER — Other Ambulatory Visit: Payer: Self-pay

## 2022-03-20 ENCOUNTER — Telehealth: Payer: Self-pay

## 2022-03-20 DIAGNOSIS — L509 Urticaria, unspecified: Secondary | ICD-10-CM

## 2022-03-20 DIAGNOSIS — E559 Vitamin D deficiency, unspecified: Secondary | ICD-10-CM

## 2022-03-20 MED ORDER — VITAMIN D (ERGOCALCIFEROL) 1.25 MG (50000 UNIT) PO CAPS: 50000.0000 [IU] | ORAL_CAPSULE | ORAL | 0 refills | Status: DC

## 2022-03-20 MED ORDER — QUETIAPINE FUMARATE 50 MG PO TABS: 50.0000 mg | ORAL_TABLET | Freq: Every day | ORAL | 0 refills | Status: DC

## 2022-03-20 MED ORDER — MONTELUKAST SODIUM 10 MG PO TABS: 10.0000 mg | ORAL_TABLET | Freq: Every day | ORAL | 1 refills | Status: DC

## 2022-03-20 MED ORDER — ALBUTEROL SULFATE HFA 108 (90 BASE) MCG/ACT IN AERS: INHALATION_SPRAY | RESPIRATORY_TRACT | 2 refills | Status: DC

## 2022-03-20 MED ORDER — TRAZODONE HCL 100 MG PO TABS: 100.0000 mg | ORAL_TABLET | Freq: Every evening | ORAL | 1 refills | Status: DC | PRN

## 2022-03-20 MED ORDER — GABAPENTIN 300 MG PO CAPS: 600.0000 mg | ORAL_CAPSULE | Freq: Three times a day (TID) | ORAL | 2 refills | Status: DC

## 2022-03-20 MED ORDER — BENZONATATE 200 MG PO CAPS: 200.0000 mg | ORAL_CAPSULE | Freq: Three times a day (TID) | ORAL | 1 refills | Status: DC | PRN

## 2022-03-20 MED ORDER — FAMOTIDINE 20 MG PO TABS: 20.0000 mg | ORAL_TABLET | Freq: Two times a day (BID) | ORAL | 3 refills | Status: DC

## 2022-03-20 NOTE — Telephone Encounter (Signed)
Patient called this afternoon requesting to get a Hep b vaccine for her job that she starts on Thursday. Patient thought she had this vaccine before but can not remember. Dr. Tobie Poet is it ok for this patient to come get this? I did not see anything in epic and Lauren did not see anything in [place] immunization registry. She just found out that she got this new job yesterday.

## 2022-03-21 ENCOUNTER — Other Ambulatory Visit: Payer: Self-pay | Admitting: Family Medicine

## 2022-03-21 NOTE — Telephone Encounter (Signed)
Patient notified that our adult Hep B is out of date. Anne [name] is ordering more. Per Dr. Tobie Poet patient is able to get the Hep B at the pharmacy and she should not need an order.

## 2022-03-22 ENCOUNTER — Other Ambulatory Visit: Payer: Self-pay

## 2022-03-22 DIAGNOSIS — L509 Urticaria, unspecified: Secondary | ICD-10-CM

## 2022-03-22 MED ORDER — ALBUTEROL SULFATE HFA 108 (90 BASE) MCG/ACT IN AERS: INHALATION_SPRAY | RESPIRATORY_TRACT | 2 refills | Status: DC

## 2022-03-26 ENCOUNTER — Telehealth: Payer: Self-pay

## 2022-03-26 ENCOUNTER — Other Ambulatory Visit: Payer: Self-pay

## 2022-03-26 DIAGNOSIS — M5416 Radiculopathy, lumbar region: Secondary | ICD-10-CM

## 2022-03-26 MED ORDER — QULIPTA 60 MG PO TABS: 60.0000 mg | ORAL_TABLET | Freq: Every day | ORAL | 2 refills | Status: DC

## 2022-03-26 MED ORDER — HYDROCODONE-ACETAMINOPHEN 5-325 MG PO TABS: 1.0000 | ORAL_TABLET | Freq: Every day | ORAL | 0 refills | Status: DC | PRN

## 2022-03-26 NOTE — Progress Notes (Cosign Needed Addendum)
Care Management & Coordination Services Pharmacy Team  Reason for Encounter: Medication coordination and delivery  Contacted patient on 03/26/2022 to discuss medications   Recent office visits:  03-14-2022 Anne [name], CMA. B12 given.  03-08-2022 Anne Noe, LPN. Medicare wellness visit.  Recent consult visits:  None  Hospital visits:  None in previous 6 months  Medications: Outpatient Encounter Medications as of 03/26/2022  Medication Sig   albuterol (PROVENTIL) (2.5 MG/3ML) 0.083% nebulizer solution Take 2.5 mg by nebulization 4 (four) times daily.   albuterol (VENTOLIN HFA) 108 (90 Base) MCG/ACT inhaler INHALE 1-2 PUFFS BY MOUTH into THE lungs EVERY 6 HOURS AS NEEDED FOR WHEEZING AND/OR SHORTNESS OF BREATH   amitriptyline (ELAVIL) 75 MG tablet TAKE 1 TABLET (75 MG TOTAL) DAILY.   Atogepant ([name]) 60 MG TABS Take 60 mg by mouth daily.   benzonatate (TESSALON) 200 MG capsule Take 1 capsule (200 mg total) by mouth 3 (three) times daily as needed for cough.   cetirizine (ZYRTEC) 10 MG tablet Take 1 tablet (10 mg total) by mouth daily.   cyanocobalamin (VITAMIN B12) 1000 MCG/ML injection INJECT 1 ML INTRAMUSCULARLY  ONCE EVERY MONTH   famotidine (PEPCID) 20 MG tablet Take 1 tablet (20 mg total) by mouth 2 (two) times daily.   Fluticasone-Umeclidin-Vilant (TRELEGY ELLIPTA) 200-62.5-25 MCG/ACT AEPB Inhale 1 puff into the lungs daily.   gabapentin (NEURONTIN) 300 MG capsule Take 2 capsules (600 mg total) by mouth 3 (three) times daily.   HYDROcodone-acetaminophen (NORCO/VICODIN) 5-325 MG tablet Take 1 tablet by mouth daily as needed for severe pain (migraines).   megestrol (MEGACE) 20 MG tablet Take 2 tablets (40 mg total) by mouth daily.   melatonin 10 MG TABS Take 20 mg by mouth at bedtime.   meloxicam (MOBIC) 15 MG tablet Take 1 tablet (15 mg total) by mouth daily.   midodrine (PROAMATINE) 2.5 MG tablet Take 1 tablet (2.5 mg total) by mouth 3 (three) times daily  with meals.   mirtazapine (REMERON) 45 MG tablet TAKE 1 TABLET AT BEDTIME   montelukast (SINGULAIR) 10 MG tablet Take 1 tablet (10 mg total) by mouth daily.   NICOTINE STEP 1 21 MG/24HR patch PLACE ONE PATCH onto THE SKIN DAILY (Patient not taking: Reported on 02/14/2022)   omeprazole (PRILOSEC) 40 MG capsule Take 1 capsule (40 mg total) by mouth 2 (two) times daily.   orphenadrine (NORFLEX) 100 MG tablet Take 1 tablet (100 mg total) by mouth 2 (two) times daily as needed.   QUEtiapine (SEROQUEL) 50 MG tablet Take 1 tablet (50 mg total) by mouth at bedtime.   rizatriptan (MAXALT-MLT) 10 MG disintegrating tablet DISSOLVE 1 TABLET ON THE TONGUE AT ONSET OF MIGRAINE, REPEAT IN 2 HOURS IF NEEDED   sertraline (ZOLOFT) 50 MG tablet Take 1 tablet (50 mg total) by mouth daily.   traZODone (DESYREL) 100 MG tablet Take 1 tablet (100 mg total) by mouth at bedtime as needed. for sleep   triamcinolone (KENALOG) 0.025 % cream Apply 1 Application topically 2 (two) times daily.   Vitamin D, Ergocalciferol, (DRISDOL) 1.25 MG (50000 UNIT) CAPS capsule Take 1 capsule (50,000 Units total) by mouth every 7 (seven) days.   No facility-administered encounter medications on file as of 03/26/2022.   BP Readings from Last 3 Encounters:  03/08/22 112/70  02/14/22 124/72  01/03/22 126/64    Pulse Readings from Last 3 Encounters:  03/08/22 72  02/14/22 70  01/03/22 (!) 122    No results found for: "HGBA1C"  Lab Results  Component Value Date   CREATININE 0.68 01/03/2022   BUN 17 01/03/2022   GFRNONAA 77 03/01/2020   GFRAA 89 03/01/2020   NA 140 01/03/2022   K 3.8 01/03/2022   CALCIUM 8.9 01/03/2022   CO2 23 01/03/2022     Last adherence delivery date: 03-05-2022   Patient is due for next adherence delivery on: 04-05-2022  Spoke with patient on 03-26-2022 reviewed medications and coordinated delivery.  This delivery to include: Adherence Packaging  30 Days  Cetirizine 10 mg- 1 tablet at breakfast   Trelegy Gabapentin 300 mg- 2 at breakfast, 2 at lunch, 2 at bedtime Quetiapine 100 mg- 0.5 mg at bedtime  Megestrol Ac 20 mg- 2 tablets before breakfast   Amitriptyline 75 mg at bedtime Sertraline 50 mg- 1 tablet at breakfast  Trazodone 100 mg- 1 at bedtime Omeprazole 40 mg- 1 at breakfast and 1 with evening meal Meloxicam 15 mg- 1 at breakfast Mirtazapine 45 mg- 1 at breakfast Midodrine 2.5 mg 1 at breakfast, 1 at lunch, 1 with evening meals Norflex 10 mg twice daily PRN (Vial) Albuterol inhaler Maxalt 10 mg DISSOLVE 1 TABLET ON THE TONGUE AT ONSET OF MIGRAINE, REPEAT IN 2 HOURS IF NEEDED (Vials) Hydrocodone 5-325 mg- Take 1 tablet by mouth daily as needed for severe pain (migraines)  Patient declined the following medications this month: Atogepant- Fills at [facility] Nicotine patch- Not taking Melatonin- OTC   No refill request needed. Sent PCP request for Hydrocodone  Confirmed delivery date of 04-05-2022, advised patient that pharmacy will contact them the morning of delivery.   Any concerns about your medications? No  How often do you forget or accidentally miss a dose? Never  Do you use a pillbox? No  Is patient in packaging Yes  If yes  What is the date on your next pill pack? 03-27-2022  Any concerns or issues with your packaging? No   Recent blood pressure readings are as follows: 112/70 03-08-2022  Recent blood glucose readings are as follows: None   Cycle dispensing form sent to [name] for review.   Anne [name]

## 2022-03-27 ENCOUNTER — Other Ambulatory Visit: Payer: Self-pay | Admitting: Family Medicine

## 2022-04-11 ENCOUNTER — Ambulatory Visit (INDEPENDENT_AMBULATORY_CARE_PROVIDER_SITE_OTHER): Payer: Medicare PPO

## 2022-04-11 DIAGNOSIS — Z111 Encounter for screening for respiratory tuberculosis: Secondary | ICD-10-CM

## 2022-04-13 ENCOUNTER — Ambulatory Visit: Payer: Medicare PPO

## 2022-04-13 LAB — TB SKIN TEST
Induration: 0 mm
TB Skin Test: NEGATIVE

## 2022-04-20 ENCOUNTER — Other Ambulatory Visit: Payer: Self-pay

## 2022-04-20 DIAGNOSIS — M5416 Radiculopathy, lumbar region: Secondary | ICD-10-CM

## 2022-04-20 MED ORDER — METHOCARBAMOL 750 MG PO TABS: 750.0000 mg | ORAL_TABLET | Freq: Two times a day (BID) | ORAL | 0 refills | Status: DC

## 2022-04-25 ENCOUNTER — Telehealth: Payer: Self-pay

## 2022-04-25 ENCOUNTER — Other Ambulatory Visit: Payer: Self-pay

## 2022-04-25 DIAGNOSIS — M5416 Radiculopathy, lumbar region: Secondary | ICD-10-CM

## 2022-04-25 NOTE — Progress Notes (Signed)
Care Management & Coordination Services Pharmacy Team  Reason for Encounter: Medication coordination and delivery  Contacted patient to discuss medications and coordinate delivery from [facility]. Spoke with patient on 04/26/2022  Cycle dispensing form sent to [facility] for review.   Last adherence delivery date: 04-05-2022    Patient is due for next adherence delivery on: 05-07-2022  This delivery to include: Adherence Packaging  30 Days  cetirizine 10 mg- 1 tablet at breakfast  Trelegy Gabapentin 300 mg- 2 at breakfast, 2 at lunch, 2 at bedtime Quetiapine 100 mg- 0.5 mg at bedtime  Megestrol Ac 20 mg- 2 tablets before breakfast   Amitriptyline 75 mg at bedtime Sertraline 50 mg- 1 tablet at breakfast  Trazodone 100 mg- 1 at bedtime Omeprazole 40 mg- 1 at breakfast and 1 with evening meal Meloxicam 15 mg- 1 at breakfast Mirtazapine 45 mg- 1 at breakfast Midodrine 2.5 mg 1 at breakfast, 1 at lunch, 1 with evening meals Norflex 10 mg twice daily PRN (Vial) Albuterol inhaler Maxalt 10 mg DISSOLVE 1 TABLET ON THE TONGUE AT ONSET OF MIGRAINE, REPEAT IN 2 HOURS IF NEEDED (Vials) Hydrocodone 5-325 mg- Take 1 tablet by mouth daily as needed for severe pain (migraines) [name] 60 mg 1 at breakfast  Patient declined the following medications this month: Albuterol- filled 04-25-2022 Nicotine patch- Not taking Melatonin- OTC   Refills requested from providers include: Hydrocodone  Confirmed delivery date of 05-07-2022, advised patient that pharmacy will contact them the morning of delivery.   Any concerns about your medications? No  How often do you forget or accidentally miss a dose? Never  Do you use a pillbox? No  Is patient in packaging Yes  If yes  What is the date on your next pill pack? 04-27-2022  Any concerns or issues with your packaging? no   Recent blood pressure readings are as follows: 110/70  Chart review: Recent office visits:  04-11-2022  Anne [name], CMA. TB test completed  Recent consult visits:  None  Hospital visits:  None in previous 6 months  Medications: Outpatient Encounter Medications as of 04/25/2022  Medication Sig   albuterol (PROVENTIL) (2.5 MG/3ML) 0.083% nebulizer solution Take 2.5 mg by nebulization 4 (four) times daily.   albuterol (VENTOLIN HFA) 108 (90 Base) MCG/ACT inhaler INHALE 1-2 PUFFS BY MOUTH into THE lungs EVERY 6 HOURS AS NEEDED FOR WHEEZING AND/OR SHORTNESS OF BREATH   amitriptyline (ELAVIL) 75 MG tablet TAKE 1 TABLET (75 MG TOTAL) DAILY.   Atogepant ([name]) 60 MG TABS Take 1 tablet (60 mg total) by mouth daily.   benzonatate (TESSALON) 200 MG capsule Take 1 capsule (200 mg total) by mouth 3 (three) times daily as needed for cough.   cetirizine (ZYRTEC) 10 MG tablet Take 1 tablet (10 mg total) by mouth daily.   cyanocobalamin (VITAMIN B12) 1000 MCG/ML injection INJECT 1 ML INTRAMUSCULARLY  ONCE EVERY MONTH   famotidine (PEPCID) 20 MG tablet Take 1 tablet (20 mg total) by mouth 2 (two) times daily.   Fluticasone-Umeclidin-Vilant (TRELEGY ELLIPTA) 200-62.5-25 MCG/ACT AEPB Inhale 1 puff into the lungs daily.   gabapentin (NEURONTIN) 300 MG capsule Take 2 capsules (600 mg total) by mouth 3 (three) times daily.   HYDROcodone-acetaminophen (NORCO/VICODIN) 5-325 MG tablet Take 1 tablet by mouth daily as needed for severe pain (migraines).   megestrol (MEGACE) 20 MG tablet Take 2 tablets (40 mg total) by mouth daily.   melatonin 10 MG TABS Take 20 mg by mouth at bedtime.  meloxicam (MOBIC) 15 MG tablet Take 1 tablet (15 mg total) by mouth daily.   methocarbamol (ROBAXIN) 750 MG tablet Take 1 tablet (750 mg total) by mouth in the morning and at bedtime.   midodrine (PROAMATINE) 2.5 MG tablet Take 1 tablet (2.5 mg total) by mouth 3 (three) times daily with meals.   mirtazapine (REMERON) 45 MG tablet TAKE 1 TABLET AT BEDTIME   montelukast (SINGULAIR) 10 MG tablet Take 1 tablet (10 mg total) by mouth  daily.   NICOTINE STEP 1 21 MG/24HR patch PLACE ONE PATCH onto THE SKIN DAILY (Patient not taking: Reported on 02/14/2022)   omeprazole (PRILOSEC) 40 MG capsule Take 1 capsule (40 mg total) by mouth 2 (two) times daily.   QUEtiapine (SEROQUEL) 50 MG tablet Take 1 tablet (50 mg total) by mouth at bedtime.   rizatriptan (MAXALT-MLT) 10 MG disintegrating tablet DISSOLVE 1 TABLET ON THE TONGUE AT ONSET OF MIGRAINE, REPEAT IN 2 HOURS IF NEEDED   sertraline (ZOLOFT) 50 MG tablet Take 1 tablet (50 mg total) by mouth daily.   traZODone (DESYREL) 100 MG tablet Take 1 tablet (100 mg total) by mouth at bedtime as needed. for sleep   triamcinolone (KENALOG) 0.025 % cream Apply 1 Application topically 2 (two) times daily.   Vitamin D, Ergocalciferol, (DRISDOL) 1.25 MG (50000 UNIT) CAPS capsule Take 1 capsule (50,000 Units total) by mouth every 7 (seven) days.   No facility-administered encounter medications on file as of 04/25/2022.   BP Readings from Last 3 Encounters:  03/08/22 112/70  02/14/22 124/72  01/03/22 126/64    Pulse Readings from Last 3 Encounters:  03/08/22 72  02/14/22 70  01/03/22 (!) 122    No results found for: "HGBA1C" Lab Results  Component Value Date   CREATININE 0.68 01/03/2022   BUN 17 01/03/2022   GFRNONAA 77 03/01/2020   GFRAA 89 03/01/2020   NA 140 01/03/2022   K 3.8 01/03/2022   CALCIUM 8.9 01/03/2022   CO2 23 01/03/2022    [place] Pharmacist Assistant [phone]

## 2022-04-26 MED ORDER — HYDROCODONE-ACETAMINOPHEN 5-325 MG PO TABS: 1.0000 | ORAL_TABLET | Freq: Every day | ORAL | 0 refills | Status: DC | PRN

## 2022-04-30 ENCOUNTER — Other Ambulatory Visit: Payer: Self-pay | Admitting: Family Medicine

## 2022-04-30 DIAGNOSIS — M5416 Radiculopathy, lumbar region: Secondary | ICD-10-CM

## 2022-04-30 NOTE — Telephone Encounter (Signed)
Compliant on meds 

## 2022-05-10 ENCOUNTER — Telehealth: Payer: Self-pay | Admitting: Family Medicine

## 2022-05-10 NOTE — Telephone Encounter (Signed)
Pt called stating she received a letter in the mail from the dmv that she needs to complete a road exam due to forms that was sent in from Dr. Tobie Poet in September 2023. I told her a staff member would contact her with information and if she doesn't hear back to call at 10:30 tomorrow.

## 2022-05-11 NOTE — Telephone Encounter (Signed)
Patient informed and scheduled.

## 2022-05-11 NOTE — Telephone Encounter (Signed)
Left message to return call back. 

## 2022-05-17 ENCOUNTER — Encounter: Payer: Self-pay | Admitting: Family Medicine

## 2022-05-17 ENCOUNTER — Ambulatory Visit (INDEPENDENT_AMBULATORY_CARE_PROVIDER_SITE_OTHER): Payer: Medicare PPO | Admitting: Family Medicine

## 2022-05-17 VITALS — BP 108/60 | HR 93 | Temp 97.6°F | Ht 60.0 in | Wt 92.5 lb

## 2022-05-17 DIAGNOSIS — E440 Moderate protein-calorie malnutrition: Secondary | ICD-10-CM

## 2022-05-17 DIAGNOSIS — F5105 Insomnia due to other mental disorder: Secondary | ICD-10-CM

## 2022-05-17 DIAGNOSIS — J440 Chronic obstructive pulmonary disease with acute lower respiratory infection: Secondary | ICD-10-CM

## 2022-05-17 DIAGNOSIS — F99 Mental disorder, not otherwise specified: Secondary | ICD-10-CM

## 2022-05-17 DIAGNOSIS — R051 Acute cough: Secondary | ICD-10-CM

## 2022-05-17 DIAGNOSIS — J410 Simple chronic bronchitis: Secondary | ICD-10-CM

## 2022-05-17 LAB — POC COVID19 BINAXNOW: SARS Coronavirus 2 Ag: NEGATIVE

## 2022-05-17 MED ORDER — QUETIAPINE FUMARATE 100 MG PO TABS: 100.0000 mg | ORAL_TABLET | Freq: Every day | ORAL | 0 refills | Status: DC

## 2022-05-17 MED ORDER — PROMETHAZINE-CODEINE 6.25-10 MG/5ML PO SYRP: 5.0000 mL | ORAL_SOLUTION | Freq: Four times a day (QID) | ORAL | 0 refills | Status: DC | PRN

## 2022-05-17 MED ORDER — TRIAMCINOLONE ACETONIDE 40 MG/ML IJ SUSP
80.0000 mg | Freq: Once | INTRAMUSCULAR | Status: AC
  Administered 2022-05-17: 80 mg via INTRAMUSCULAR

## 2022-05-17 MED ORDER — AMOXICILLIN-POT CLAVULANATE 875-125 MG PO TABS: 1.0000 | ORAL_TABLET | Freq: Two times a day (BID) | ORAL | 0 refills | Status: DC

## 2022-05-17 MED ORDER — CEFTRIAXONE SODIUM 1 G IJ SOLR
1.0000 g | Freq: Once | INTRAMUSCULAR | Status: AC
  Administered 2022-05-17: 1 g via INTRAMUSCULAR

## 2022-05-17 MED ORDER — PREDNISONE 50 MG PO TABS: 50.0000 mg | ORAL_TABLET | Freq: Every day | ORAL | 0 refills | Status: DC

## 2022-05-17 NOTE — Patient Instructions (Addendum)
Prescription: augmentin, prednisone, phenergan with codeine cough syrup.  Given kenalog 80 mg and rocephin 1 gm shots given.  For sleep: increase quetiapine to 100 mg before bed.   Increase protein in diet. 3 protein shakes daily.

## 2022-05-17 NOTE — Progress Notes (Unsigned)
Acute Office Visit  Subjective:                                              Patient ID: Anne [name], female    DOB: January 30, 1946, 77 y.o.   MRN: [id number]  Chief Complaint  Patient presents with   URI    HPI: Patient is in today for uri x 1 week, cough-dry, congestion, fatigue, no fever or chills and no body aches has not taken any medications for symptoms.  Also complains of decreased appetite and trouble with sleeping.  Past Medical History:  Diagnosis Date   Acquired absence of both breasts and nipples 06/08/2015   Acute infection of nasal sinus 05/04/2021   Aortic atherosclerosis 01/30/2021   Asthma    daily inhalers   Ataxia 10/18/2020   Bilateral carpal tunnel syndrome 01/30/2021   Bipolar disorder, mixed 06/16/2021   Breast cancer of upper-inner quadrant of left female breast 04/15/2015   Carotid artery disease 05/04/2016   Mild, 1-39% bilaterally by doppler in March 2018   Chronic pain in right shoulder 12/25/2020   COPD with acute exacerbation 07/03/2016   Dental crowns present    Drug induced constipation 04/10/2021   EIC (epidermal inclusion cyst) 03/21/2021   Family history of breast cancer in female 04/26/2015   Dx. Niece at 38; dx. Maternal aunt in her late 63s  Formatting of this note might be different from the original. Overview:  Dx. Niece at 49; dx. Maternal aunt in her late 3s   Family history of colon cancer 04/26/2015   Dx. In maternal grandfather in his late 18s-70  Formatting of this note might be different from the original. Overview:  Dx. In maternal grandfather in his late 49s-70   Frequent falls 10/18/2020   Genetic testing 06/02/2015   Negative for pathogenic mutations within any of 20 genes on the Breast/Ovarian Cancer Panel through [employer].  No variants of uncertain significance (VUSes) were found.  The Breast/Ovarian Cancer Panel offered by [facility] Hope Pigeon, MD) includes sequencing and deletion/duplication  analysis for the following 19 genes:  ATM, BARD1, BRCA1, BRCA2, BRIP1, CDH1, CHEK2, FANCC, M   History of basal cell carcinoma 04/26/2015   History of concussion 01/01/2014   History of kidney stones    History of TIA (transient ischemic attack) 10/2014   Hyperlipidemia    Insomnia    Lumbar back pain 10/18/2020   Major depressive disorder 10/18/2020   Malrotation of intestine 09/23/2017   Migraine aura without headache 02/08/2014   Migraine headache 01/30/2021   Nasal congestion 05/26/2015   will finish z-pak 05/27/2015   Nonproductive cough 05/26/2015   PONV (postoperative nausea and vomiting)    "long time ago"  none recently   Psychogenic syncope 04/23/2019   Right lumbar radiculopathy 04/23/2019   RLS (restless legs syndrome)    Simple chronic bronchitis 07/04/2019   Tobacco use    Tremor 10/18/2020   Vitamin D deficiency    Wears dentures    Weight loss 08/28/2019    Past Surgical History:  Procedure Laterality Date   ABDOMINAL HYSTERECTOMY     APPENDECTOMY  2000   BREAST BIOPSY Left    BREAST RECONSTRUCTION WITH PLACEMENT OF TISSUE EXPANDER AND FLEX HD (ACELLULAR HYDRATED DERMIS) Bilateral 06/02/2015   Procedure: BILATERAL BREAST RECONSTRUCTION WITH PLACEMENT OF TISSUE EXPANDER AND  ACELLULAR HYDRATED  DERMIS;  Surgeon: Irene Limbo, MD;  Location: [place];  Service: Plastics;  Laterality: Bilateral;   CARPAL TUNNEL RELEASE Bilateral    CATARACT EXTRACTION Bilateral 09/2013   KNEE ARTHROSCOPY Left    LAPAROSCOPIC ABDOMINAL EXPLORATION N/A 10/2017   LAPAROSCOPIC LYSIS OF CONGENITAL ADHESIVE BAND/LADD'S BANDS WIH INTRAOPERATIVE CHOLANGIOGRAPHY. Perforation of small intestion requiring repair.    LOOP RECORDER INSERTION N/A 05/11/2016   Procedure: Loop Recorder Insertion;  Surgeon: Will Meredith Leeds, MD;  Location: [facility];  Service: Cardiovascular;  Laterality: N/A;   MASTECTOMY W/ SENTINEL NODE BIOPSY Bilateral 06/02/2015   Procedure:  BILATERAL MASTECTOMY WITH LEFT SENTINEL LYMPH NODE BIOPSY;  Surgeon: Stark Klein, MD;  Location: [place];  Service: General;  Laterality: Bilateral;   NASAL SEPTUM SURGERY     x 3   PLANTAR FASCIA SURGERY Left    REMOVAL OF BILATERAL TISSUE EXPANDERS WITH PLACEMENT OF BILATERAL BREAST IMPLANTS Bilateral 06/17/2015   Procedure: DEBRIDEMENT OF BILATERAL MASTECTOMY FLAP WITH BILATERAL TISSUE EXPANDER EXCHANGE ;  Surgeon: Irene Limbo, MD;  Location: [place];  Service: Plastics;  Laterality: Bilateral;   REMOVAL OF TISSUE EXPANDER Bilateral 07/12/2015   Procedure: REMOVAL OF BILATERAL TISSUE EXPANDERS;  Surgeon: Irene Limbo, MD;  Location: [place];  Service: Plastics;  Laterality: Bilateral;    Family History  Problem Relation Age of Onset   Cervical cancer Mother 57   Colon cancer Maternal Grandfather        mets to stomach; dx. 67-70   Heart disease Father    Prostate cancer Father 53   Cervical cancer Maternal Grandmother        dx. 71s; treated with radium implant   Lung cancer Sister 82       maternal half-sister dx. lung cancer, stage III   Breast cancer Maternal Aunt    Cervical cancer Sister 36       paternal half-sister; s/p TAH   Spina bifida Grandchild    Brain cancer Grandchild        grandson dx. at 20 mos, treated at [facility] Other 2       maternal half-sister's daughter   Cancer Maternal Aunt        d. 37; unspecified type - "began at back area and moved to vital organs"   Cancer Maternal Uncle         late 79s; unspecified type; "started at back and moved to vital organs"    Social History   Socioeconomic History   Marital status: Widowed    Spouse name: Not on file   Number of children: 1   Years of education: 52   Highest education level: Not on file  Occupational History    Comment: [employer], retired  Tobacco Use   Smoking status: Every Day    Packs/day: .25    Types: Cigarettes    Smokeless tobacco: Never   Tobacco comments:    Currently wearing nicotine patches  Vaping Use   Vaping Use: Never used  Substance and Sexual Activity   Alcohol use: Never   Drug use: No   Sexual activity: Not Currently  Other Topics Concern   Not on file  Social History Narrative   Lives alone, widow   Caffeine use- tea, 1 cup daily   Right Handed    Lives in a one story [place] home    Social Determinants of Health   Financial Resource Strain: Low Risk  (  02/17/2021)   Overall Financial Resource Strain (CARDIA)    Difficulty of Paying Living Expenses: Not hard at all  Food Insecurity: Food Insecurity Present (03/08/2022)   Hunger Vital Sign    Worried About Running Out of Food in the Last Year: Never true    Ran Out of Food in the Last Year: Sometimes true  Transportation Needs: No Transportation Needs (03/08/2022)   PRAPARE - Hydrologist (Medical): No    Lack of Transportation (Non-Medical): No  Physical Activity: Inactive (03/08/2022)   Exercise Vital Sign    Days of Exercise per Week: 0 days    Minutes of Exercise per Session: 0 min  Stress: Stress Concern Present (03/08/2022)   [place]    Feeling of Stress : To some extent  Social Connections: Socially Isolated (05/17/2022)   Social Connection and Isolation Panel [NHANES]    Frequency of Communication with Friends and Family: Twice a week    Frequency of Social Gatherings with Friends and Family: Twice a week    Attends Religious Services: Never    Marine scientist or Organizations: No    Attends Archivist Meetings: Never    Marital Status: Divorced  Human resources officer Violence: Not At Risk (05/17/2022)   Humiliation, Afraid, Rape, and Kick questionnaire    Fear of Current or Ex-Partner: No    Emotionally Abused: No    Physically Abused: No    Sexually Abused: No    Outpatient Medications Prior to Visit   Medication Sig Dispense Refill   albuterol (PROVENTIL) (2.5 MG/3ML) 0.083% nebulizer solution Take 2.5 mg by nebulization 4 (four) times daily.     albuterol (VENTOLIN HFA) 108 (90 Base) MCG/ACT inhaler INHALE 1-2 PUFFS BY MOUTH into THE lungs EVERY 6 HOURS AS NEEDED FOR WHEEZING AND/OR SHORTNESS OF BREATH 18 g 2   amitriptyline (ELAVIL) 75 MG tablet TAKE 1 TABLET (75 MG TOTAL) DAILY. 90 tablet 1   Atogepant ([name]) 60 MG TABS Take 1 tablet (60 mg total) by mouth daily. 30 tablet 2   benzonatate (TESSALON) 200 MG capsule Take 1 capsule (200 mg total) by mouth 3 (three) times daily as needed for cough. 30 capsule 1   cetirizine (ZYRTEC) 10 MG tablet Take 1 tablet (10 mg total) by mouth daily. 30 tablet 11   cyanocobalamin (VITAMIN B12) 1000 MCG/ML injection INJECT 1 ML INTRAMUSCULARLY  ONCE EVERY MONTH 3 mL 0   famotidine (PEPCID) 20 MG tablet Take 1 tablet (20 mg total) by mouth 2 (two) times daily. 180 tablet 3   Fluticasone-Umeclidin-Vilant (TRELEGY ELLIPTA) 200-62.5-25 MCG/ACT AEPB Inhale 1 puff into the lungs daily. 3 each 3   gabapentin (NEURONTIN) 300 MG capsule Take 2 capsules (600 mg total) by mouth 3 (three) times daily. 180 capsule 2   HYDROcodone-acetaminophen (NORCO/VICODIN) 5-325 MG tablet TAKE ONE TABLET BY MOUTH daily AS NEEDED FOR SEVERE pain (migranes) 30 tablet 0   megestrol (MEGACE) 20 MG tablet Take 2 tablets (40 mg total) by mouth daily. 180 tablet 0   melatonin 10 MG TABS Take 20 mg by mouth at bedtime. 1 tablet 0   meloxicam (MOBIC) 15 MG tablet Take 1 tablet (15 mg total) by mouth daily. 90 tablet 0   methocarbamol (ROBAXIN) 750 MG tablet Take 1 tablet (750 mg total) by mouth in the morning and at bedtime. 60 tablet 0   midodrine (PROAMATINE) 2.5 MG tablet Take 1 tablet (2.5  mg total) by mouth 3 (three) times daily with meals. 270 tablet 1   mirtazapine (REMERON) 45 MG tablet TAKE 1 TABLET AT BEDTIME 90 tablet 3   montelukast (SINGULAIR) 10 MG tablet Take 1 tablet (10 mg  total) by mouth daily. 90 tablet 1   NICOTINE STEP 1 21 MG/24HR patch PLACE ONE PATCH onto THE SKIN DAILY (Patient not taking: Reported on 02/14/2022) 30 patch 0   omeprazole (PRILOSEC) 40 MG capsule Take 1 capsule (40 mg total) by mouth 2 (two) times daily. 180 capsule 0   rizatriptan (MAXALT-MLT) 10 MG disintegrating tablet DISSOLVE 1 TABLET ON THE TONGUE AT ONSET OF MIGRAINE, REPEAT IN 2 HOURS IF NEEDED 10 tablet 2   sertraline (ZOLOFT) 50 MG tablet Take 1 tablet (50 mg total) by mouth daily. 90 tablet 0   traZODone (DESYREL) 100 MG tablet Take 1 tablet (100 mg total) by mouth at bedtime as needed. for sleep 90 tablet 1   triamcinolone (KENALOG) 0.025 % cream Apply 1 Application topically 2 (two) times daily. 30 g 0   Vitamin D, Ergocalciferol, (DRISDOL) 1.25 MG (50000 UNIT) CAPS capsule Take 1 capsule (50,000 Units total) by mouth every 7 (seven) days. 12 capsule 0   QUEtiapine (SEROQUEL) 50 MG tablet Take 1 tablet (50 mg total) by mouth at bedtime. 90 tablet 0   No facility-administered medications prior to visit.    Allergies  Allergen Reactions   Clindamycin/Lincomycin Nausea And Vomiting   Lyrica [Pregabalin] Other (See Comments)    PERSONALITY CHANGE    Review of Systems  Constitutional:  Negative for chills, fatigue and fever.  HENT:  Negative for congestion, ear pain, postnasal drip, rhinorrhea, sinus pressure, sinus pain and sore throat.   Respiratory:  Negative for cough and shortness of breath.   Cardiovascular:  Negative for chest pain.  Gastrointestinal:  Negative for diarrhea and nausea.  Neurological:  Negative for dizziness and headaches.       Objective:        05/17/2022   11:13 AM 03/08/2022    1:52 PM 02/14/2022   10:03 AM  Vitals with BMI  Height 5\' 0"  5\' 0"  5\' 0"   Weight 92 lbs 8 oz 94 lbs 10 oz 94 lbs 10 oz  BMI 18.07 18.48 18.48  Systolic 108 112  Diastolic 60 70 72  Pulse 93 72 70    No data found.   Physical Exam  Health Maintenance Due   Topic Date Due   Hepatitis C Screening  Never done   COVID-19 Vaccine (3 - Moderna risk series) 11/26/2021   Zoster Vaccines- Shingrix (2 of 2) 04/02/2022   Lung Cancer Screening  05/30/2022    There are no preventive care reminders to display for this patient.   Lab Results  Component Value Date   TSH 1.700 02/20/2022   Lab Results  Component Value Date   WBC 7.0 02/20/2022   HGB 13.1 02/20/2022   HCT 40.4 02/20/2022   MCV 94 02/20/2022   PLT 235 02/20/2022   Lab Results  Component Value Date   NA 140 01/03/2022   K 3.8 01/03/2022   CO2 23 01/03/2022   GLUCOSE 140 (H) 01/03/2022   BUN 17 01/03/2022   CREATININE 0.68 01/03/2022   BILITOT 0.2 01/03/2022   ALKPHOS 79 01/03/2022   AST 18 01/03/2022   ALT 18 01/03/2022   PROT 5.8 (L) 01/03/2022   ALBUMIN 3.6 (L) 01/03/2022   CALCIUM 8.9 01/03/2022   ANIONGAP 3 (L)  07/03/2016   EGFR 91 01/03/2022   Lab Results  Component Value Date   CHOL 181 02/20/2022   Lab Results  Component Value Date   HDL 59 02/20/2022   Lab Results  Component Value Date   LDLCALC 101 (H) 02/20/2022   Lab Results  Component Value Date   TRIG 121 02/20/2022   Lab Results  Component Value Date   CHOLHDL 3.1 02/20/2022   No results found for: "HGBA1C"     Assessment & Plan:  Simple chronic bronchitis (HCC) Assessment & Plan: Prescription: augmentin, prednisone, phenergan with codeine cough syrup.  Given kenalog 80 mg and rocephin 1 gm shots given.   Orders: -     cefTRIAXone Sodium -     Triamcinolone Acetonide  Acute cough Assessment & Plan: Checked rapid covid test.  Orders: -     POC COVID-19 BinaxNow  Insomnia due to other mental disorder Assessment & Plan: increase quetiapine to 100 mg before bed.     Other orders -     Amoxicillin-Pot Clavulanate; Take 1 tablet by mouth 2 (two) times daily.  Dispense: 20 tablet; Refill: 0 -     predniSONE; Take 1 tablet (50 mg total) by mouth daily with breakfast.   Dispense: 5 tablet; Refill: 0 -     Promethazine-Codeine; Take 5 mLs by mouth every 6 (six) hours as needed for cough.  Dispense: 120 mL; Refill: 0 -     QUEtiapine Fumarate; Take 1 tablet (100 mg total) by mouth at bedtime.  Dispense: 90 tablet; Refill: 0     Meds ordered this encounter  Medications   amoxicillin-clavulanate (AUGMENTIN) 875-125 MG tablet    Sig: Take 1 tablet by mouth 2 (two) times daily.    Dispense:  20 tablet    Refill:  0   predniSONE (DELTASONE) 50 MG tablet    Sig: Take 1 tablet (50 mg total) by mouth daily with breakfast.    Dispense:  5 tablet    Refill:  0   promethazine-codeine (PHENERGAN WITH CODEINE) 6.25-10 MG/5ML syrup    Sig: Take 5 mLs by mouth every 6 (six) hours as needed for cough.    Dispense:  120 mL    Refill:  0   QUEtiapine (SEROQUEL) 100 MG tablet    Sig: Take 1 tablet (100 mg total) by mouth at bedtime.    Dispense:  90 tablet    Refill:  0   cefTRIAXone (ROCEPHIN) injection 1 g   triamcinolone acetonide (KENALOG-40) injection 80 mg    Orders Placed This Encounter  Procedures   POC COVID-19 BinaxNow     Follow-up: No follow-ups on file.  An After Visit Summary was printed and given to the patient.   Geralynn Ochs I Leal-Borjas,acting as a scribe for [name], MD.,have documented all relevant documentation on the behalf of [name], MD,as directed by  [name], MD while in the presence of [name], MD.    [name], MD [place] [phone]

## 2022-05-20 NOTE — Assessment & Plan Note (Signed)
increase quetiapine to 100 mg before bed.

## 2022-05-20 NOTE — Assessment & Plan Note (Signed)
Prescription: augmentin, prednisone, phenergan with codeine cough syrup.  Given kenalog 80 mg and rocephin 1 gm shots given.

## 2022-05-20 NOTE — Assessment & Plan Note (Signed)
Checked rapid covid test.

## 2022-05-21 ENCOUNTER — Other Ambulatory Visit: Payer: Self-pay | Admitting: Family Medicine

## 2022-05-21 DIAGNOSIS — E559 Vitamin D deficiency, unspecified: Secondary | ICD-10-CM

## 2022-05-24 ENCOUNTER — Telehealth: Payer: Self-pay

## 2022-05-24 ENCOUNTER — Other Ambulatory Visit: Payer: Self-pay | Admitting: Family Medicine

## 2022-05-24 DIAGNOSIS — J410 Simple chronic bronchitis: Secondary | ICD-10-CM | POA: Insufficient documentation

## 2022-05-24 DIAGNOSIS — E440 Moderate protein-calorie malnutrition: Secondary | ICD-10-CM | POA: Insufficient documentation

## 2022-05-24 DIAGNOSIS — M5416 Radiculopathy, lumbar region: Secondary | ICD-10-CM

## 2022-05-24 MED ORDER — HYDROCODONE-ACETAMINOPHEN 5-325 MG PO TABS: ORAL_TABLET | ORAL | 0 refills | Status: DC

## 2022-05-24 NOTE — Assessment & Plan Note (Signed)
Increase protein in diet. 3 protein shakes daily.

## 2022-05-24 NOTE — Progress Notes (Unsigned)
Subjective:  Patient ID: Anne [name], female    DOB: 03-May-1945  Age: 77 y.o. MRN: [id number]  Chief Complaint  Patient presents with   Hyperlipidemia   Gastroesophageal Reflux    HPI COPD: trelegy one inhalation daily. Albuterol 2 puffs four times a day as needed.   B12 deficiency: B12 shots monthly.   Headaches: 2-3 migraines per week. Taking hydrocodone for these. [name] daily. On amitriptyline 75 mg before bed.   Insomnia: mirtazepine 45 mg before bed. Trazodone 100 mg before bed as needed. Sleeping well. Increased quetiapine 100 mg before bed.   GERD: Currently on omeprazole 40 mg one oral twice daily and famotidine 20 mg twice daily  Calorie malnutrition: Weight has greatly improved. Megace 40 mg daily.   Back pain: on gabapentin 300 mg 2 capsules three times a day. Meloxicam 15 mg daily. Has norflex 100 mg one oral twice daily. No falls.   HYPERTENSION; used to be on antihypertensive medicines. Has needed midodrine 2.5 mg three times a day with meals.   Depression: zoloft 50 mg once daily, Quetiapine 100 mg one before bed.      03/08/2022    2:48 PM 02/14/2022   10:09 AM 10/19/2021   10:52 AM 10/05/2021    9:50 AM 07/11/2021    3:36 PM  Depression screen PHQ 2/9  Decreased Interest 1 1 1 1 2   Down, Depressed, Hopeless 1 1 1 1 2   PHQ - 2 Score 2 2 2 2 4   Altered sleeping 1 1 0 0 2  Tired, decreased energy 1 1 0 1 3  Change in appetite 0 0 3 0 3  Feeling bad or failure about yourself  0 0 0 0 2  Trouble concentrating 0 0 0 0 2  Moving slowly or fidgety/restless 0 0 0 0 2  Suicidal thoughts 0 0 0 0 0  PHQ-9 Score 4 4 5 3 18   Difficult doing work/chores Not difficult at all Not difficult at all Somewhat difficult Not difficult at all Somewhat difficult         10/19/2021   10:51 AM 02/14/2022   10:08 AM 03/08/2022    2:55 PM 05/17/2022   11:23 AM 05/17/2022   11:25 AM  Fall Risk  Falls in the past year? 0 1 1  0  Was there an injury with Fall? 0 1 0 0 0  Fall  Risk Category Calculator 0 3 2  0  Fall Risk Category (Retired) Low High Moderate    (RETIRED) Patient Fall Risk Level  Low fall risk Low fall risk    Patient at Risk for Falls Due to History of fall(s) History of fall(s) History of fall(s) No Fall Risks No Fall Risks  Fall risk Follow up Falls evaluation completed Falls evaluation completed Falls evaluation completed;Education provided Falls evaluation completed Falls evaluation completed      Review of Systems  Constitutional:  Negative for chills, fatigue and fever.  HENT:  Negative for congestion, ear pain, rhinorrhea and sore throat.   Respiratory:  Positive for cough and shortness of breath.   Cardiovascular:  Negative for chest pain.  Gastrointestinal:  Negative for abdominal pain, constipation, diarrhea, nausea and vomiting.  Genitourinary:  Negative for dysuria and urgency.  Musculoskeletal:  Positive for back pain. Negative for myalgias.  Neurological:  Positive for headaches. Negative for dizziness, weakness and light-headedness.  Psychiatric/Behavioral:  Negative for dysphoric mood. The patient is not nervous/anxious.     Current Outpatient Medications on  File Prior to Visit  Medication Sig Dispense Refill   albuterol (PROVENTIL) (2.5 MG/3ML) 0.083% nebulizer solution Take 2.5 mg by nebulization 4 (four) times daily.     albuterol (VENTOLIN HFA) 108 (90 Base) MCG/ACT inhaler INHALE 1-2 PUFFS BY MOUTH into THE lungs EVERY 6 HOURS AS NEEDED FOR WHEEZING AND/OR SHORTNESS OF BREATH 18 g 2   amitriptyline (ELAVIL) 75 MG tablet TAKE 1 TABLET (75 MG TOTAL) DAILY. 90 tablet 1   Atogepant ([name]) 60 MG TABS Take 1 tablet (60 mg total) by mouth daily. 30 tablet 2   cyanocobalamin (VITAMIN B12) 1000 MCG/ML injection INJECT 1 ML INTRAMUSCULARLY  ONCE EVERY MONTH 3 mL 0   famotidine (PEPCID) 20 MG tablet Take 1 tablet (20 mg total) by mouth 2 (two) times daily. 180 tablet 3   Fluticasone-Umeclidin-Vilant (TRELEGY ELLIPTA) 200-62.5-25  MCG/ACT AEPB Inhale 1 puff into the lungs daily. 3 each 3   gabapentin (NEURONTIN) 300 MG capsule TAKE TWO CAPSULES BY MOUTH EVERY MORNING and TAKE TWO CAPSULES BY MOUTH AT NOON and TAKE TWO CAPSULES BY MOUTH EVERYDAY AT BEDTIME 180 capsule 2   HYDROcodone-acetaminophen (NORCO/VICODIN) 5-325 MG tablet TAKE ONE TABLET BY MOUTH daily AS NEEDED FOR SEVERE pain (migranes) 30 tablet 0   megestrol (MEGACE) 20 MG tablet Take 2 tablets (40 mg total) by mouth daily. 180 tablet 0   melatonin 10 MG TABS Take 20 mg by mouth at bedtime. 1 tablet 0   meloxicam (MOBIC) 15 MG tablet Take 1 tablet (15 mg total) by mouth daily. 90 tablet 0   methocarbamol (ROBAXIN) 750 MG tablet Take 1 tablet (750 mg total) by mouth in the morning and at bedtime. 60 tablet 0   midodrine (PROAMATINE) 2.5 MG tablet Take 1 tablet (2.5 mg total) by mouth 3 (three) times daily with meals. 270 tablet 1   mirtazapine (REMERON) 45 MG tablet TAKE 1 TABLET AT BEDTIME 90 tablet 3   montelukast (SINGULAIR) 10 MG tablet Take 1 tablet (10 mg total) by mouth daily. 90 tablet 1   omeprazole (PRILOSEC) 40 MG capsule Take 1 capsule (40 mg total) by mouth 2 (two) times daily. 180 capsule 0   predniSONE (DELTASONE) 50 MG tablet Take 1 tablet (50 mg total) by mouth daily with breakfast. 5 tablet 0   QUEtiapine (SEROQUEL) 100 MG tablet Take 1 tablet (100 mg total) by mouth at bedtime. 90 tablet 0   rizatriptan (MAXALT-MLT) 10 MG disintegrating tablet DISSOLVE 1 TABLET ON THE TONGUE AT ONSET OF MIGRAINE, REPEAT IN 2 HOURS IF NEEDED 10 tablet 2   sertraline (ZOLOFT) 50 MG tablet Take 1 tablet (50 mg total) by mouth daily. 90 tablet 0   traZODone (DESYREL) 100 MG tablet Take 1 tablet (100 mg total) by mouth at bedtime as needed. for sleep 90 tablet 1   triamcinolone (KENALOG) 0.025 % cream Apply 1 Application topically 2 (two) times daily. 30 g 0   Vitamin D, Ergocalciferol, (DRISDOL) 1.25 MG (50000 UNIT) CAPS capsule TAKE 1 CAPSULE (50,000 UNITS TOTAL) BY  MOUTH EVERY 7 (SEVEN) DAYS. 12 capsule 3   No current facility-administered medications on file prior to visit.   Past Medical History:  Diagnosis Date   Acquired absence of both breasts and nipples 06/08/2015   Acute infection of nasal sinus 05/04/2021   Aortic atherosclerosis 01/30/2021   Asthma    daily inhalers   Ataxia 10/18/2020   Bilateral carpal tunnel syndrome 01/30/2021   Bipolar disorder, mixed 06/16/2021   Breast cancer of  upper-inner quadrant of left female breast 04/15/2015   Carotid artery disease 05/04/2016   Mild, 1-39% bilaterally by doppler [name] 2018   Chronic pain in right shoulder 12/25/2020   COPD with acute exacerbation 07/03/2016   Dental crowns present    Drug induced constipation 04/10/2021   EIC (epidermal inclusion cyst) 03/21/2021   Family history of breast cancer in female 04/26/2015   Dx. Niece at 42; dx. Maternal aunt in her late 78s  Formatting of this note might be different from the original. Overview:  Dx. Niece at 26; dx. Maternal aunt in her late 31s   Family history of colon cancer 04/26/2015   Dx. In maternal grandfather in his late 74s-70  Formatting of this note might be different from the original. Overview:  Dx. In maternal grandfather in his late 55s-70   Frequent falls 10/18/2020   Genetic testing 06/02/2015   Negative for pathogenic mutations within any of 20 genes on the Breast/Ovarian Cancer Panel through [employer].  No variants of uncertain significance (VUSes) were found.  The Breast/Ovarian Cancer Panel offered by [facility] Hope Pigeon, MD) includes sequencing and deletion/duplication analysis for the following 19 genes:  ATM, BARD1, BRCA1, BRCA2, BRIP1, CDH1, CHEK2, FANCC, M   History of basal cell carcinoma 04/26/2015   History of concussion 01/01/2014   History of kidney stones    History of TIA (transient ischemic attack) 10/2014   Hyperlipidemia    Insomnia    Lumbar back pain 10/18/2020   Major  depressive disorder 10/18/2020   Malrotation of intestine 09/23/2017   Migraine aura without headache 02/08/2014   Migraine headache 01/30/2021   Nasal congestion 05/26/2015   will finish z-pak 05/27/2015   Nonproductive cough 05/26/2015   PONV (postoperative nausea and vomiting)    "long time ago"  none recently   Psychogenic syncope 04/23/2019   Right lumbar radiculopathy 04/23/2019   RLS (restless legs syndrome)    Simple chronic bronchitis 07/04/2019   Tobacco use    Tremor 10/18/2020   Vitamin D deficiency    Wears dentures    Weight loss 08/28/2019   Past Surgical History:  Procedure Laterality Date   ABDOMINAL HYSTERECTOMY     APPENDECTOMY  2000   BREAST BIOPSY Left    BREAST RECONSTRUCTION WITH PLACEMENT OF TISSUE EXPANDER AND FLEX HD (ACELLULAR HYDRATED DERMIS) Bilateral 06/02/2015   Procedure: BILATERAL BREAST RECONSTRUCTION WITH PLACEMENT OF TISSUE EXPANDER AND  ACELLULAR HYDRATED DERMIS;  Surgeon: Irene Limbo, MD;  Location: [place];  Service: Plastics;  Laterality: Bilateral;   CARPAL TUNNEL RELEASE Bilateral    CATARACT EXTRACTION Bilateral 09/2013   KNEE ARTHROSCOPY Left    LAPAROSCOPIC ABDOMINAL EXPLORATION N/A 10/2017   LAPAROSCOPIC LYSIS OF CONGENITAL ADHESIVE BAND/LADD'S BANDS WIH INTRAOPERATIVE CHOLANGIOGRAPHY. Perforation of small intestion requiring repair.    LOOP RECORDER INSERTION N/A 05/11/2016   Procedure: Loop Recorder Insertion;  Surgeon: Will Meredith Leeds, MD;  Location: [place];  Service: Cardiovascular;  Laterality: N/A;   MASTECTOMY W/ SENTINEL NODE BIOPSY Bilateral 06/02/2015   Procedure: BILATERAL MASTECTOMY WITH LEFT SENTINEL LYMPH NODE BIOPSY;  Surgeon: Stark Klein, MD;  Location: [facility];  Service: General;  Laterality: Bilateral;   NASAL SEPTUM SURGERY     x 3   PLANTAR FASCIA SURGERY Left    REMOVAL OF BILATERAL TISSUE EXPANDERS WITH PLACEMENT OF BILATERAL BREAST IMPLANTS Bilateral 06/17/2015    Procedure: DEBRIDEMENT OF BILATERAL MASTECTOMY FLAP WITH BILATERAL TISSUE EXPANDER EXCHANGE ;  Surgeon: Irene Limbo, MD;  Location: [place] [facility];  Service: Plastics;  Laterality: Bilateral;   REMOVAL OF TISSUE EXPANDER Bilateral 07/12/2015   Procedure: REMOVAL OF BILATERAL TISSUE EXPANDERS;  Surgeon: Irene Limbo, MD;  Location: [facility];  Service: Plastics;  Laterality: Bilateral;    Family History  Problem Relation Age of Onset   Cervical cancer Mother 38   Colon cancer Maternal Grandfather        mets to stomach; dx. 67-70   Heart disease Father    Prostate cancer Father 15   Cervical cancer Maternal Grandmother        dx. 2s; treated with radium implant   Lung cancer Sister 31       maternal half-sister dx. lung cancer, stage III   Breast cancer Maternal Aunt    Cervical cancer Sister 56       paternal half-sister; s/p TAH   Spina bifida Grandchild    Brain cancer Grandchild        grandson dx. at 20 mos, treated at [place] cancer Other 25       maternal half-sister's daughter   Cancer Maternal Aunt        d. 73; unspecified type - "began at back area and moved to vital organs"   Cancer Maternal Uncle         late 51s; unspecified type; "started at back and moved to vital organs"   Social History   Socioeconomic History   Marital status: Widowed    Spouse name: Not on file   Number of children: 1   Years of education: 36   Highest education level: Not on file  Occupational History    Comment: [employer], retired  Tobacco Use   Smoking status: Every Day    Packs/day: .25    Types: Cigarettes   Smokeless tobacco: Never   Tobacco comments:    Currently wearing nicotine patches  Vaping Use   Vaping Use: Never used  Substance and Sexual Activity   Alcohol use: Never   Drug use: No   Sexual activity: Not Currently  Other Topics Concern   Not on file  Social History Narrative   Lives alone, widow   Caffeine use- tea, 1 cup daily    Right Handed    Lives in a one story [place] home    Social Determinants of Health   Financial Resource Strain: Low Risk  (02/17/2021)   Overall Financial Resource Strain (CARDIA)    Difficulty of Paying Living Expenses: Not hard at all  Food Insecurity: Food Insecurity Present (03/08/2022)   Hunger Vital Sign    Worried About [place] in the Last Year: Never true    [place] in the Last Year: Sometimes true  Transportation Needs: No Transportation Needs (03/08/2022)   PRAPARE - Hydrologist (Medical): No    Lack of Transportation (Non-Medical): No  Physical Activity: Inactive (03/08/2022)   Exercise Vital Sign    Days of Exercise per Week: 0 days    Minutes of Exercise per Session: 0 min  Stress: Stress Concern Present (03/08/2022)   [place]    Feeling of Stress : To some extent  Social Connections: Socially Isolated (05/17/2022)   Social Connection and Isolation Panel [NHANES]    Frequency of Communication with Friends and Family: Twice a week    Frequency of Social Gatherings with  Friends and Family: Twice a week    Attends Religious Services: Never    Printmaker: No    Attends Music therapist: Never    Marital Status: Divorced    Objective:  BP 122/68   Pulse 66   Temp (!) 97.4 F (36.3 C)   Ht 5' (1.524 m)   Wt 90 lb (40.8 kg)   SpO2 98%   BMI 17.58 kg/m      05/25/2022    8:08 AM 05/17/2022   11:13 AM 03/08/2022    1:52 PM  BP/Weight  Systolic BP 123XX123 [id number]  Diastolic BP 68 60 70  Wt. (Lbs) 90 92.5 94.6  BMI 17.58 kg/m2 18.07 kg/m2 18.48 kg/m2    Physical Exam Vitals reviewed.  Constitutional:      Appearance: Normal appearance.     Comments: thin  HENT:     Right Ear: Tympanic membrane, ear canal and external ear normal.     Left Ear: Tympanic membrane, ear canal and external ear normal.     Nose:  Rhinorrhea present.     Mouth/Throat:     Pharynx: Oropharynx is clear.  Neck:     Vascular: No carotid bruit.  Cardiovascular:     Rate and Rhythm: Normal rate and regular rhythm.     Heart sounds: Normal heart sounds. No murmur heard. Pulmonary:     Effort: Pulmonary effort is normal. No respiratory distress.     Breath sounds: Normal breath sounds.  Neurological:     Mental Status: She is alert and oriented to person, place, and time.  Psychiatric:        Mood and Affect: Mood normal.        Behavior: Behavior normal.     Diabetic Foot Exam - Simple   No data filed      Lab Results  Component Value Date   WBC 7.0 02/20/2022   HGB 13.1 02/20/2022   HCT 40.4 02/20/2022   PLT 235 02/20/2022   GLUCOSE 140 (H) 01/03/2022   CHOL 181 02/20/2022   TRIG 121 02/20/2022   HDL 59 02/20/2022   LDLCALC 101 (H) 02/20/2022   ALT 18 01/03/2022   AST 18 01/03/2022   NA 140 01/03/2022   K 3.8 01/03/2022   CL 104 01/03/2022   CREATININE 0.68 01/03/2022   BUN 17 01/03/2022   CO2 23 01/03/2022   TSH 1.700 02/20/2022      Assessment & Plan:    Mixed hyperlipidemia -     Comprehensive metabolic panel -     Lipid panel  Bipolar disorder, mixed  Vitamin B12 deficiency -     Vitamin B12  Gastroesophageal reflux disease without esophagitis -     CBC with Differential/Platelet  Simple chronic bronchitis  Seasonal allergic rhinitis due to pollen  Age-related osteoporosis without current pathological fracture  Moderate protein-calorie malnutrition (HCC)  Aortic atherosclerosis  Tobacco dependency  Idiopathic hypotension Assessment & Plan: Midodrine: Decrease to twice a day for one week, then once a day in the morning x 1 week, then discontinue. Check bp daily. If systolic bp drops below 123XX123 stop tapering medicine and call our office.   Other orders -     Loratadine; Take 1 tablet (10 mg total) by mouth daily.  Dispense: 90 tablet; Refill: 3 -     Varenicline  Tartrate (Starter); Take 0.5 mg by mouth daily for 3 days, THEN 0.5 mg 2 (two) times daily for  4 days, THEN 1 mg 2 (two) times daily for 21 days.  Dispense: 1 each; Refill: 0     Meds ordered this encounter  Medications   loratadine (CLARITIN) 10 MG tablet    Sig: Take 1 tablet (10 mg total) by mouth daily.    Dispense:  90 tablet    Refill:  3   Varenicline Tartrate, Starter, (CHANTIX STARTING MONTH PAK) 0.5 MG X 11 & 1 MG X 42 TBPK    Sig: Take 0.5 mg by mouth daily for 3 days, THEN 0.5 mg 2 (two) times daily for 4 days, THEN 1 mg 2 (two) times daily for 21 days.    Dispense:  1 each    Refill:  0    Orders Placed This Encounter  Procedures   Comprehensive metabolic panel   Lipid panel   CBC with Differential/Platelet   Vitamin B12     Follow-up: Return in about 3 months (around 08/25/2022) for chronic fasting.   I,Marla I Leal-Borjas,acting as a scribe for [name], MD.,have documented all relevant documentation on the behalf of [name], MD,as directed by  [name], MD while in the presence of [name], MD.   An After Visit Summary was printed and given to the patient.  [name], MD [facility] [phone]

## 2022-05-24 NOTE — Progress Notes (Unsigned)
Care Management & Coordination Services Pharmacy Team  Reason for Encounter: Medication coordination and delivery  Contacted patient to discuss medications and coordinate delivery from [facility]. {US HC Outreach:28874} Cycle dispensing form sent to *** for review.   Last adherence delivery date: 05-07-2022  Patient is due for next adherence delivery on: 06-05-2022  This delivery to include: Adherence Packaging  30 Days  cetirizine 10 mg- 1 tablet at breakfast  Trelegy Gabapentin 300 mg- 2 at breakfast, 2 at lunch, 2 at bedtime Quetiapine 100 mg- 0.5 mg at bedtime  Megestrol Ac 20 mg- 2 tablets before breakfast   Amitriptyline 75 mg at bedtime Sertraline 50 mg- 1 tablet at breakfast  Trazodone 100 mg- 1 at bedtime Omeprazole 40 mg- 1 at breakfast and 1 with evening meal Meloxicam 15 mg- 1 at breakfast Mirtazapine 45 mg- 1 at breakfast Midodrine 2.5 mg 1 at breakfast, 1 at lunch, 1 with evening meals  Albuterol inhaler Maxalt 10 mg DISSOLVE 1 TABLET ON THE TONGUE AT ONSET OF MIGRAINE, REPEAT IN 2 HOURS IF NEEDED (Vials) Hydrocodone 5-325 mg- Take 1 tablet by mouth daily as needed for severe pain (migraines) [name] 60 mg 1 at breakfast  Patient declined the following medications this month: ***  Refills requested from providers include: Hydrocodone  {Delivery date:25786}   Any concerns about your medications? {yes/no:20286}  How often do you forget or accidentally miss a dose? {Missed doses:25554}  Do you use a pillbox? {yes/no:20286}  Is patient in packaging {yes/no:20286}  If yes  What is the date on your next pill pack?  Any concerns or issues with your packaging?   Recent blood pressure readings are as follows:***  Recent blood glucose readings are as follows:***   Chart review: Recent office visits:  05-17-2022 [name], MD. Negative covid test. START augmentin 875-125 mg twice daily, prednisone 50 mg daily and promethazine-codeine 5 mLs every  6 hours PRN. INCREASE quetiapine 50 mg at bedtime to 100 mg at bedtime. Kenelog and rocepin given.  Recent consult visits:  None  Hospital visits:  None in previous 6 months  Medications: Outpatient Encounter Medications as of 05/24/2022  Medication Sig   albuterol (PROVENTIL) (2.5 MG/3ML) 0.083% nebulizer solution Take 2.5 mg by nebulization 4 (four) times daily.   albuterol (VENTOLIN HFA) 108 (90 Base) MCG/ACT inhaler INHALE 1-2 PUFFS BY MOUTH into THE lungs EVERY 6 HOURS AS NEEDED FOR WHEEZING AND/OR SHORTNESS OF BREATH   amitriptyline (ELAVIL) 75 MG tablet TAKE 1 TABLET (75 MG TOTAL) DAILY.   amoxicillin-clavulanate (AUGMENTIN) 875-125 MG tablet Take 1 tablet by mouth 2 (two) times daily.   Atogepant ([name]) 60 MG TABS Take 1 tablet (60 mg total) by mouth daily.   benzonatate (TESSALON) 200 MG capsule Take 1 capsule (200 mg total) by mouth 3 (three) times daily as needed for cough.   cetirizine (ZYRTEC) 10 MG tablet Take 1 tablet (10 mg total) by mouth daily.   cyanocobalamin (VITAMIN B12) 1000 MCG/ML injection INJECT 1 ML INTRAMUSCULARLY  ONCE EVERY MONTH   famotidine (PEPCID) 20 MG tablet Take 1 tablet (20 mg total) by mouth 2 (two) times daily.   Fluticasone-Umeclidin-Vilant (TRELEGY ELLIPTA) 200-62.5-25 MCG/ACT AEPB Inhale 1 puff into the lungs daily.   gabapentin (NEURONTIN) 300 MG capsule Take 2 capsules (600 mg total) by mouth 3 (three) times daily.   HYDROcodone-acetaminophen (NORCO/VICODIN) 5-325 MG tablet TAKE ONE TABLET BY MOUTH daily AS NEEDED FOR SEVERE pain (migranes)   megestrol (MEGACE) 20 MG tablet Take 2 tablets (  40 mg total) by mouth daily.   melatonin 10 MG TABS Take 20 mg by mouth at bedtime.   meloxicam (MOBIC) 15 MG tablet Take 1 tablet (15 mg total) by mouth daily.   methocarbamol (ROBAXIN) 750 MG tablet Take 1 tablet (750 mg total) by mouth in the morning and at bedtime.   midodrine (PROAMATINE) 2.5 MG tablet Take 1 tablet (2.5 mg total) by mouth 3 (three)  times daily with meals.   mirtazapine (REMERON) 45 MG tablet TAKE 1 TABLET AT BEDTIME   montelukast (SINGULAIR) 10 MG tablet Take 1 tablet (10 mg total) by mouth daily.   NICOTINE STEP 1 21 MG/24HR patch PLACE ONE PATCH onto THE SKIN DAILY (Patient not taking: Reported on 02/14/2022)   omeprazole (PRILOSEC) 40 MG capsule Take 1 capsule (40 mg total) by mouth 2 (two) times daily.   predniSONE (DELTASONE) 50 MG tablet Take 1 tablet (50 mg total) by mouth daily with breakfast.   promethazine-codeine (PHENERGAN WITH CODEINE) 6.25-10 MG/5ML syrup Take 5 mLs by mouth every 6 (six) hours as needed for cough.   QUEtiapine (SEROQUEL) 100 MG tablet Take 1 tablet (100 mg total) by mouth at bedtime.   rizatriptan (MAXALT-MLT) 10 MG disintegrating tablet DISSOLVE 1 TABLET ON THE TONGUE AT ONSET OF MIGRAINE, REPEAT IN 2 HOURS IF NEEDED   sertraline (ZOLOFT) 50 MG tablet Take 1 tablet (50 mg total) by mouth daily.   traZODone (DESYREL) 100 MG tablet Take 1 tablet (100 mg total) by mouth at bedtime as needed. for sleep   triamcinolone (KENALOG) 0.025 % cream Apply 1 Application topically 2 (two) times daily.   Vitamin D, Ergocalciferol, (DRISDOL) 1.25 MG (50000 UNIT) CAPS capsule TAKE 1 CAPSULE (50,000 UNITS TOTAL) BY MOUTH EVERY 7 (SEVEN) DAYS.   No facility-administered encounter medications on file as of 05/24/2022.   BP Readings from Last 3 Encounters:  05/17/22 108/60  03/08/22 112/70  02/14/22 124/72    Pulse Readings from Last 3 Encounters:  05/17/22 93  03/08/22 72  02/14/22 70    No results found for: "HGBA1C" Lab Results  Component Value Date   CREATININE 0.68 01/03/2022   BUN 17 01/03/2022   GFRNONAA 77 03/01/2020   GFRAA 89 03/01/2020   NA 140 01/03/2022   K 3.8 01/03/2022   CALCIUM 8.9 01/03/2022   CO2 23 01/03/2022  05-24-2022: 1st attempt left VM 05-25-2022: 2nd attempt left VM  [place] Clinical Pharmacist Assistant [phone]

## 2022-05-25 ENCOUNTER — Encounter: Payer: Self-pay | Admitting: Family Medicine

## 2022-05-25 ENCOUNTER — Ambulatory Visit: Payer: Medicare PPO | Admitting: Family Medicine

## 2022-05-25 VITALS — BP 122/68 | HR 66 | Temp 97.4°F | Ht 60.0 in | Wt 90.0 lb

## 2022-05-25 DIAGNOSIS — J410 Simple chronic bronchitis: Secondary | ICD-10-CM | POA: Diagnosis not present

## 2022-05-25 DIAGNOSIS — M5416 Radiculopathy, lumbar region: Secondary | ICD-10-CM

## 2022-05-25 DIAGNOSIS — E440 Moderate protein-calorie malnutrition: Secondary | ICD-10-CM | POA: Diagnosis not present

## 2022-05-25 DIAGNOSIS — J301 Allergic rhinitis due to pollen: Secondary | ICD-10-CM | POA: Diagnosis not present

## 2022-05-25 DIAGNOSIS — K219 Gastro-esophageal reflux disease without esophagitis: Secondary | ICD-10-CM | POA: Diagnosis not present

## 2022-05-25 DIAGNOSIS — F17200 Nicotine dependence, unspecified, uncomplicated: Secondary | ICD-10-CM | POA: Diagnosis not present

## 2022-05-25 DIAGNOSIS — E538 Deficiency of other specified B group vitamins: Secondary | ICD-10-CM

## 2022-05-25 DIAGNOSIS — E782 Mixed hyperlipidemia: Secondary | ICD-10-CM

## 2022-05-25 DIAGNOSIS — I700 Atherosclerosis of aorta: Secondary | ICD-10-CM | POA: Diagnosis not present

## 2022-05-25 DIAGNOSIS — F3160 Bipolar disorder, current episode mixed, unspecified: Secondary | ICD-10-CM

## 2022-05-25 DIAGNOSIS — F330 Major depressive disorder, recurrent, mild: Secondary | ICD-10-CM

## 2022-05-25 DIAGNOSIS — I950 Idiopathic hypotension: Secondary | ICD-10-CM

## 2022-05-25 DIAGNOSIS — M810 Age-related osteoporosis without current pathological fracture: Secondary | ICD-10-CM

## 2022-05-25 MED ORDER — VARENICLINE TARTRATE (STARTER) 0.5 MG X 11 & 1 MG X 42 PO TBPK: ORAL_TABLET | ORAL | 0 refills | Status: DC

## 2022-05-25 MED ORDER — LORATADINE 10 MG PO TABS: 10.0000 mg | ORAL_TABLET | Freq: Every day | ORAL | 3 refills | Status: DC

## 2022-05-25 NOTE — Patient Instructions (Signed)
Midodrine: Decrease to twice a day for one week, then once a day in the morning x 1 week, then discontinue. Check bp daily. If systolic bp drops below 123XX123 stop tapering medicine and call our office.

## 2022-05-25 NOTE — Assessment & Plan Note (Signed)
Midodrine: Decrease to twice a day for one week, then once a day in the morning x 1 week, then discontinue. Check bp daily. If systolic bp drops below 123XX123 stop tapering medicine and call our office.

## 2022-05-26 LAB — LIPID PANEL
Chol/HDL Ratio: 3.4 ratio (ref 0.0–4.4)
Cholesterol, Total: 181 mg/dL (ref 100–199)
HDL: 53 mg/dL (ref >39)
LDL Chol Calc (NIH): 110 mg/dL — ABNORMAL HIGH (ref 0–99)
Triglycerides: 100 mg/dL (ref 0–149)
VLDL Cholesterol Cal: 18 mg/dL (ref 5–40)

## 2022-05-26 LAB — CBC WITH DIFFERENTIAL/PLATELET
Basophils Absolute: 0.1 10*3/uL (ref 0.0–0.2)
Basos: 1 %
EOS (ABSOLUTE): 0.2 10*3/uL (ref 0.0–0.4)
Eos: 2 %
Hematocrit: 41.6 % (ref 34.0–46.6)
Hemoglobin: 13.1 g/dL (ref 11.1–15.9)
Immature Grans (Abs): 0.1 10*3/uL (ref 0.0–0.1)
Immature Granulocytes: 1 %
Lymphocytes Absolute: 2.2 10*3/uL (ref 0.7–3.1)
Lymphs: 20 %
MCH: 29.2 pg (ref 26.6–33.0)
MCHC: 31.5 g/dL (ref 31.5–35.7)
MCV: 93 fL (ref 79–97)
Monocytes Absolute: 1 10*3/uL — ABNORMAL HIGH (ref 0.1–0.9)
Monocytes: 9 %
Neutrophils Absolute: 7.3 10*3/uL — ABNORMAL HIGH (ref 1.4–7.0)
Neutrophils: 67 %
Platelets: 281 10*3/uL (ref 150–450)
RBC: 4.49 x10E6/uL (ref 3.77–5.28)
RDW: 13.9 % (ref 11.7–15.4)
WBC: 10.8 10*3/uL (ref 3.4–10.8)

## 2022-05-26 LAB — COMPREHENSIVE METABOLIC PANEL
ALT: 11 IU/L (ref 0–32)
AST: 12 IU/L (ref 0–40)
Albumin/Globulin Ratio: 2.3 — ABNORMAL HIGH (ref 1.2–2.2)
Albumin: 4.6 g/dL (ref 3.8–4.8)
Alkaline Phosphatase: 110 IU/L (ref 44–121)
BUN/Creatinine Ratio: 21 (ref 12–28)
BUN: 17 mg/dL (ref 8–27)
Bilirubin Total: <0.2 mg/dL (ref 0.0–1.2)
CO2: 23 mmol/L (ref 20–29)
Calcium: 9.6 mg/dL (ref 8.7–10.3)
Chloride: 105 mmol/L (ref 96–106)
Creatinine, Ser: 0.8 mg/dL (ref 0.57–1.00)
Globulin, Total: 2 g/dL (ref 1.5–4.5)
Glucose: 96 mg/dL (ref 70–99)
Potassium: 4.5 mmol/L (ref 3.5–5.2)
Sodium: 143 mmol/L (ref 134–144)
Total Protein: 6.6 g/dL (ref 6.0–8.5)
eGFR: 77 mL/min/{1.73_m2} (ref >59)

## 2022-05-26 LAB — VITAMIN B12: Vitamin B-12: 896 pg/mL (ref 232–1245)

## 2022-05-26 LAB — CARDIOVASCULAR RISK ASSESSMENT

## 2022-05-27 NOTE — Assessment & Plan Note (Signed)
Check labs 

## 2022-05-27 NOTE — Assessment & Plan Note (Signed)
Increase protein in diet. 3 protein shakes daily.

## 2022-05-27 NOTE — Assessment & Plan Note (Addendum)
The current medical regimen is effective;  continue present plan and medications.  trelegy one inhalation daily. Albuterol 2 puffs four times a day as needed.

## 2022-05-27 NOTE — Assessment & Plan Note (Signed)
Management per specialist.  Taking gabapentin 300 mg 2 capsules three times a day. Meloxicam 15 mg daily, norflex 100 mg one oral twice daily.

## 2022-05-27 NOTE — Assessment & Plan Note (Signed)
Sent Loratadine 10 mg daily.

## 2022-05-27 NOTE — Assessment & Plan Note (Signed)
The current medical regimen is effective;  continue present plan and medications.  Taking Omeprazole 40 mg twice a day and Famotidine 20 mg twice a day.

## 2022-05-27 NOTE — Assessment & Plan Note (Signed)
Continue seroquel 100 mg before bed.

## 2022-05-27 NOTE — Assessment & Plan Note (Signed)
The current medical regimen is effective;  continue present plan and medications. Taking Sertraline 50 mg daily, Trazodone 100 mg at bedtime as needed.

## 2022-05-27 NOTE — Assessment & Plan Note (Signed)
Sent Chantix.

## 2022-05-27 NOTE — Assessment & Plan Note (Signed)
Recommend continue to work on eating healthy diet and exercise.  

## 2022-05-27 NOTE — Assessment & Plan Note (Signed)
>>  ASSESSMENT AND PLAN FOR MILD RECURRENT MAJOR DEPRESSION (HCC) WRITTEN ON 05/27/2022  7:57 PM BY LEAL-BORJAS, Vollie Aaron I, CMA  The current medical regimen is effective;  continue present plan and medications. Taking Sertraline 50 mg daily, Trazodone 100 mg at bedtime as needed.

## 2022-05-27 NOTE — Assessment & Plan Note (Signed)
Continue to work on eating a healthy diet. 

## 2022-05-31 ENCOUNTER — Telehealth: Payer: Self-pay | Admitting: Family Medicine

## 2022-05-31 ENCOUNTER — Other Ambulatory Visit: Payer: Self-pay | Admitting: Family Medicine

## 2022-05-31 NOTE — Telephone Encounter (Signed)
Left vm for pt to call main office to get an appt with dr. Tobie Poet -- WORSE COUGH THEN NORMAL - CANT SLEEP BUT VERY TIRED - HEADACHE, EAR PAIN, NO ST- NO CONGESTION OR FEVER--

## 2022-06-05 ENCOUNTER — Other Ambulatory Visit: Payer: Self-pay | Admitting: Family Medicine

## 2022-06-05 ENCOUNTER — Other Ambulatory Visit: Payer: Self-pay

## 2022-06-05 DIAGNOSIS — E559 Vitamin D deficiency, unspecified: Secondary | ICD-10-CM

## 2022-06-05 MED ORDER — ATORVASTATIN CALCIUM 10 MG PO TABS: 10.0000 mg | ORAL_TABLET | Freq: Every day | ORAL | 2 refills | Status: DC

## 2022-06-08 ENCOUNTER — Ambulatory Visit (INDEPENDENT_AMBULATORY_CARE_PROVIDER_SITE_OTHER): Payer: Medicare PPO | Admitting: Family Medicine

## 2022-06-08 ENCOUNTER — Encounter: Payer: Self-pay | Admitting: Family Medicine

## 2022-06-08 VITALS — BP 90/50 | HR 96 | Temp 99.4°F | Resp 14 | Ht 60.0 in | Wt 94.0 lb

## 2022-06-08 DIAGNOSIS — R5383 Other fatigue: Secondary | ICD-10-CM

## 2022-06-08 DIAGNOSIS — I950 Idiopathic hypotension: Secondary | ICD-10-CM

## 2022-06-08 DIAGNOSIS — F5105 Insomnia due to other mental disorder: Secondary | ICD-10-CM | POA: Diagnosis not present

## 2022-06-08 DIAGNOSIS — K921 Melena: Secondary | ICD-10-CM | POA: Insufficient documentation

## 2022-06-08 DIAGNOSIS — F99 Mental disorder, not otherwise specified: Secondary | ICD-10-CM | POA: Diagnosis not present

## 2022-06-08 LAB — COMPREHENSIVE METABOLIC PANEL WITH GFR
ALT: 12 IU/L (ref 0–32)
AST: 14 IU/L (ref 0–40)
Albumin/Globulin Ratio: 2.5 — ABNORMAL HIGH (ref 1.2–2.2)
Albumin: 4.3 g/dL (ref 3.8–4.8)
Alkaline Phosphatase: 93 IU/L (ref 44–121)
BUN/Creatinine Ratio: 36 — ABNORMAL HIGH (ref 12–28)
BUN: 28 mg/dL — ABNORMAL HIGH (ref 8–27)
Bilirubin Total: 0.3 mg/dL (ref 0.0–1.2)
CO2: 23 mmol/L (ref 20–29)
Calcium: 9.4 mg/dL (ref 8.7–10.3)
Chloride: 106 mmol/L (ref 96–106)
Creatinine, Ser: 0.78 mg/dL (ref 0.57–1.00)
Globulin, Total: 1.7 g/dL (ref 1.5–4.5)
Glucose: 80 mg/dL (ref 70–99)
Potassium: 4.8 mmol/L (ref 3.5–5.2)
Sodium: 141 mmol/L (ref 134–144)
Total Protein: 6 g/dL (ref 6.0–8.5)
eGFR: 79 mL/min/1.73

## 2022-06-08 LAB — CBC WITH DIFFERENTIAL/PLATELET
Basophils Absolute: 0 10*3/uL (ref 0.0–0.2)
Basos: 0 %
EOS (ABSOLUTE): 0.2 10*3/uL (ref 0.0–0.4)
Eos: 1 %
Hematocrit: 37.5 % (ref 34.0–46.6)
Hemoglobin: 12.4 g/dL (ref 11.1–15.9)
Lymphocytes Absolute: 2.2 10*3/uL (ref 0.7–3.1)
Lymphs: 17 %
MCH: 29.9 pg (ref 26.6–33.0)
MCHC: 33.1 g/dL (ref 31.5–35.7)
MCV: 90 fL (ref 79–97)
Monocytes Absolute: 1 10*3/uL — ABNORMAL HIGH (ref 0.1–0.9)
Monocytes: 8 %
Neutrophils Absolute: 9.4 10*3/uL — ABNORMAL HIGH (ref 1.4–7.0)
Neutrophils: 74 %
Platelets: 246 10*3/uL (ref 150–450)
RBC: 4.15 x10E6/uL (ref 3.77–5.28)
RDW: 15.6 % — ABNORMAL HIGH (ref 11.7–15.4)
WBC: 12.8 10*3/uL — ABNORMAL HIGH (ref 3.4–10.8)

## 2022-06-08 MED ORDER — QUETIAPINE FUMARATE 100 MG PO TABS
100.0000 mg | ORAL_TABLET | Freq: Every day | ORAL | 0 refills | Status: DC
Start: 2022-06-08 — End: 2022-07-05

## 2022-06-08 NOTE — Assessment & Plan Note (Signed)
Restart midodrine once daily in am to bring bp up and help with dizziness.  Check bp daily.

## 2022-06-08 NOTE — Progress Notes (Unsigned)
Subjective:  Patient ID: Anne [name], female    DOB: 01/01/1946  Age: 77 y.o. MRN: [id number]  Chief Complaint  Patient presents with   Fatigue   Encopresis    HPI Insomnia: Patient mentioned that she wakes up around 3 am and she cannot fall asleep again. It is happening almost every day. She is feeling weak and lack of energy since 4 months ago. She also noticed leaking on her BM since 3 months ago. She does not feel it and now she is using pads.Bowel movements normal otherwise.      03/08/2022    2:48 PM 02/14/2022   10:09 AM 10/19/2021   10:52 AM 10/05/2021    9:50 AM 07/11/2021    3:36 PM  Depression screen PHQ 2/9  Decreased Interest 1 1 1 1 2   Down, Depressed, Hopeless 1 1 1 1 2   PHQ - 2 Score 2 2 2 2 4   Altered sleeping 1 1 0 0 2  Tired, decreased energy 1 1 0 1 3  Change in appetite 0 0 3 0 3  Feeling bad or failure about yourself  0 0 0 0 2  Trouble concentrating 0 0 0 0 2  Moving slowly or fidgety/restless 0 0 0 0 2  Suicidal thoughts 0 0 0 0 0  PHQ-9 Score 4 4 5 3 18   Difficult doing work/chores Not difficult at all Not difficult at all Somewhat difficult Not difficult at all Somewhat difficult         10/19/2021   10:51 AM 02/14/2022   10:08 AM 03/08/2022    2:55 PM 05/17/2022   11:23 AM 05/17/2022   11:25 AM  Fall Risk  Falls in the past year? 0 1 1  0  Was there an injury with Fall? 0 1 0 0 0  Fall Risk Category Calculator 0 3 2  0  Fall Risk Category (Retired) Low High Moderate    (RETIRED) Patient Fall Risk Level  Low fall risk Low fall risk    Patient at Risk for Falls Due to History of fall(s) History of fall(s) History of fall(s) No Fall Risks No Fall Risks  Fall risk Follow up Falls evaluation completed Falls evaluation completed Falls evaluation completed;Education provided Falls evaluation completed Falls evaluation completed      Review of Systems  Constitutional:  Positive for fatigue. Negative for chills and fever.  HENT:  Negative for  congestion, ear pain and sore throat.   Respiratory:  Positive for cough and shortness of breath.   Cardiovascular:  Negative for chest pain and palpitations.  Gastrointestinal:  Positive for abdominal pain (suprapubic area). Negative for constipation, diarrhea, nausea and vomiting.       Noticed leaking stool  Endocrine: Negative for polydipsia, polyphagia and polyuria.  Genitourinary:  Negative for difficulty urinating and dysuria.  Musculoskeletal:  Positive for back pain. Negative for arthralgias and myalgias.  Skin:  Negative for rash.  Neurological:  Negative for headaches.  Psychiatric/Behavioral:  Positive for dysphoric mood. The patient is not nervous/anxious.     Current Outpatient Medications on File Prior to Visit  Medication Sig Dispense Refill   albuterol (PROVENTIL) (2.5 MG/3ML) 0.083% nebulizer solution Take 2.5 mg by nebulization 4 (four) times daily.     albuterol (VENTOLIN HFA) 108 (90 Base) MCG/ACT inhaler INHALE 1-2 PUFFS BY MOUTH into THE lungs every SIX hours AS NEEDED FOR WHEEZING AND/OR SHORTNESS OF BREATH 8.5 g 2   amitriptyline (ELAVIL) 75 MG tablet TAKE  1 TABLET EVERY DAY 90 tablet 0   Atogepant ([name]) 60 MG TABS Take 1 tablet (60 mg total) by mouth daily. 30 tablet 2   atorvastatin (LIPITOR) 10 MG tablet Take 1 tablet (10 mg total) by mouth daily. 30 tablet 2   co-enzyme Q-10 30 MG capsule Take 30 mg by mouth 3 (three) times daily.     cyanocobalamin (VITAMIN B12) 1000 MCG/ML injection INJECT 1 ML INTRAMUSCULARLY  ONCE EVERY MONTH 3 mL 0   famotidine (PEPCID) 20 MG tablet Take 1 tablet (20 mg total) by mouth 2 (two) times daily. 180 tablet 3   Fluticasone-Umeclidin-Vilant (TRELEGY ELLIPTA) 200-62.5-25 MCG/ACT AEPB Inhale 1 puff into the lungs daily. 3 each 3   gabapentin (NEURONTIN) 300 MG capsule TAKE TWO CAPSULES BY MOUTH EVERY MORNING and TAKE TWO CAPSULES BY MOUTH AT NOON and TAKE TWO CAPSULES BY MOUTH EVERYDAY AT BEDTIME 180 capsule 2    HYDROcodone-acetaminophen (NORCO/VICODIN) 5-325 MG tablet TAKE ONE TABLET BY MOUTH daily AS NEEDED FOR SEVERE pain (migranes) 30 tablet 0   loratadine (CLARITIN) 10 MG tablet Take 1 tablet (10 mg total) by mouth daily. 90 tablet 3   megestrol (MEGACE) 20 MG tablet Take 2 tablets (40 mg total) by mouth daily. 180 tablet 0   melatonin 10 MG TABS Take 20 mg by mouth at bedtime. 1 tablet 0   meloxicam (MOBIC) 15 MG tablet Take 1 tablet (15 mg total) by mouth daily. 90 tablet 0   methocarbamol (ROBAXIN) 750 MG tablet Take 1 tablet (750 mg total) by mouth in the morning and at bedtime. 60 tablet 0   midodrine (PROAMATINE) 2.5 MG tablet TAKE 1 TABLET THREE TIMES DAILY WITH MEALS 270 tablet 0   mirtazapine (REMERON) 45 MG tablet TAKE 1 TABLET AT BEDTIME 90 tablet 3   montelukast (SINGULAIR) 10 MG tablet Take 1 tablet (10 mg total) by mouth daily. 90 tablet 1   omeprazole (PRILOSEC) 40 MG capsule Take 1 capsule (40 mg total) by mouth 2 (two) times daily. 180 capsule 0   predniSONE (DELTASONE) 50 MG tablet Take 1 tablet (50 mg total) by mouth daily with breakfast. 5 tablet 0   rizatriptan (MAXALT-MLT) 10 MG disintegrating tablet DISSOLVE 1 TABLET ON THE TONGUE AT ONSET OF MIGRAINE, REPEAT IN 2 HOURS IF NEEDED 10 tablet 2   sertraline (ZOLOFT) 50 MG tablet Take 1 tablet (50 mg total) by mouth daily. 90 tablet 0   traZODone (DESYREL) 100 MG tablet Take 1 tablet (100 mg total) by mouth at bedtime as needed. for sleep 90 tablet 1   triamcinolone (KENALOG) 0.025 % cream Apply 1 Application topically 2 (two) times daily. 30 g 0   Varenicline Tartrate, Starter, (CHANTIX STARTING MONTH PAK) 0.5 MG X 11 & 1 MG X 42 TBPK Take 0.5 mg by mouth daily for 3 days, THEN 0.5 mg 2 (two) times daily for 4 days, THEN 1 mg 2 (two) times daily for 21 days. 1 each 0   Vitamin D, Ergocalciferol, (DRISDOL) 1.25 MG (50000 UNIT) CAPS capsule TAKE 1 CAPSULE EVERY 7 DAYS. 12 capsule 1   No current facility-administered medications on  file prior to visit.   Past Medical History:  Diagnosis Date   Acquired absence of both breasts and nipples 06/08/2015   Acute infection of nasal sinus 05/04/2021   Aortic atherosclerosis 01/30/2021   Asthma    daily inhalers   Ataxia 10/18/2020   Bilateral carpal tunnel syndrome 01/30/2021   Bipolar disorder, mixed 06/16/2021  Breast cancer of upper-inner quadrant of left female breast 04/15/2015   Carotid artery disease 05/04/2016   Mild, 1-39% bilaterally by doppler in March 2018   Chronic pain in right shoulder 12/25/2020   COPD with acute exacerbation 07/03/2016   Dental crowns present    Drug induced constipation 04/10/2021   EIC (epidermal inclusion cyst) 03/21/2021   Family history of breast cancer in female 04/26/2015   Dx. Niece at 70; dx. Maternal aunt in her late 20s  Formatting of this note might be different from the original. Overview:  Dx. Niece at 88; dx. Maternal aunt in her late 81s   Family history of colon cancer 04/26/2015   Dx. In maternal grandfather in his late 2s-70  Formatting of this note might be different from the original. Overview:  Dx. In maternal grandfather in his late 35s-70   Frequent falls 10/18/2020   Genetic testing 06/02/2015   Negative for pathogenic mutations within any of 20 genes on the Breast/Ovarian Cancer Panel through [employer].  No variants of uncertain significance (VUSes) were found.  The Breast/Ovarian Cancer Panel offered by [facility] Basilio Cairo, MD) includes sequencing and deletion/duplication analysis for the following 19 genes:  ATM, BARD1, BRCA1, BRCA2, BRIP1, CDH1, CHEK2, FANCC, M   History of basal cell carcinoma 04/26/2015   History of concussion 01/01/2014   History of kidney stones    History of TIA (transient ischemic attack) 10/2014   Hyperlipidemia    Insomnia    Lumbar back pain 10/18/2020   Major depressive disorder 10/18/2020   Malrotation of intestine 09/23/2017   Migraine aura without  headache 02/08/2014   Migraine headache 01/30/2021   Nasal congestion 05/26/2015   will finish z-pak 05/27/2015   Nonproductive cough 05/26/2015   PONV (postoperative nausea and vomiting)    "long time ago"  none recently   Psychogenic syncope 04/23/2019   Right lumbar radiculopathy 04/23/2019   RLS (restless legs syndrome)    Simple chronic bronchitis 07/04/2019   Tobacco use    Tremor 10/18/2020   Vitamin D deficiency    Wears dentures    Weight loss 08/28/2019   Past Surgical History:  Procedure Laterality Date   ABDOMINAL HYSTERECTOMY     APPENDECTOMY  2000   BREAST BIOPSY Left    BREAST RECONSTRUCTION WITH PLACEMENT OF TISSUE EXPANDER AND FLEX HD (ACELLULAR HYDRATED DERMIS) Bilateral 06/02/2015   Procedure: BILATERAL BREAST RECONSTRUCTION WITH PLACEMENT OF TISSUE EXPANDER AND  ACELLULAR HYDRATED DERMIS;  Surgeon: Glenna Fellows, MD;  Location: [facility];  Service: Plastics;  Laterality: Bilateral;   CARPAL TUNNEL RELEASE Bilateral    CATARACT EXTRACTION Bilateral 09/2013   KNEE ARTHROSCOPY Left    LAPAROSCOPIC ABDOMINAL EXPLORATION N/A 10/2017   LAPAROSCOPIC LYSIS OF CONGENITAL ADHESIVE BAND/LADD'S BANDS WIH INTRAOPERATIVE CHOLANGIOGRAPHY. Perforation of small intestion requiring repair.    LOOP RECORDER INSERTION N/A 05/11/2016   Procedure: Loop Recorder Insertion;  Surgeon: Will Jorja Loa, MD;  Location: [facility];  Service: Cardiovascular;  Laterality: N/A;   MASTECTOMY W/ SENTINEL NODE BIOPSY Bilateral 06/02/2015   Procedure: BILATERAL MASTECTOMY WITH LEFT SENTINEL LYMPH NODE BIOPSY;  Surgeon: Almond Lint, MD;  Location: [facility];  Service: General;  Laterality: Bilateral;   NASAL SEPTUM SURGERY     x 3   PLANTAR FASCIA SURGERY Left    REMOVAL OF BILATERAL TISSUE EXPANDERS WITH PLACEMENT OF BILATERAL BREAST IMPLANTS Bilateral 06/17/2015   Procedure: DEBRIDEMENT OF BILATERAL MASTECTOMY FLAP WITH BILATERAL TISSUE EXPANDER EXCHANGE  ;  Surgeon: Glenna Fellows, MD;  Location: [facility];  Service: Plastics;  Laterality: Bilateral;   REMOVAL OF TISSUE EXPANDER Bilateral 07/12/2015   Procedure: REMOVAL OF BILATERAL TISSUE EXPANDERS;  Surgeon: Glenna Fellows, MD;  Location: [facility];  Service: Plastics;  Laterality: Bilateral;    Family History  Problem Relation Age of Onset   Cervical cancer Mother 35   Colon cancer Maternal Grandfather        mets to stomach; dx. 67-70   Heart disease Father    Prostate cancer Father 15   Cervical cancer Maternal Grandmother        dx. 52s; treated with radium implant   Lung cancer Sister 61       maternal half-sister dx. lung cancer, stage III   Breast cancer Maternal Aunt    Cervical cancer Sister 73       paternal half-sister; s/p TAH   Spina bifida Grandchild    Brain cancer Grandchild        grandson dx. at 20 mos, treated at [facility]   Breast cancer Other 51       maternal half-sister's daughter   Cancer Maternal Aunt        d. 70; unspecified type - "began at back area and moved to vital organs"   Cancer Maternal Uncle         late 85s; unspecified type; "started at back and moved to vital organs"   Social History   Socioeconomic History   Marital status: Widowed    Spouse name: Not on file   Number of children: 1   Years of education: 59   Highest education level: Not on file  Occupational History    Comment: [employer], retired  Tobacco Use   Smoking status: Every Day    Packs/day: .25    Types: Cigarettes   Smokeless tobacco: Never   Tobacco comments:    Currently wearing nicotine patches  Vaping Use   Vaping Use: Never used  Substance and Sexual Activity   Alcohol use: Never   Drug use: No   Sexual activity: Not Currently  Other Topics Concern   Not on file  Social History Narrative   Lives alone, widow   Caffeine use- tea, 1 cup daily   Right Handed    Lives in a one story [place] home    Social Determinants of Health    Financial Resource Strain: Low Risk  (02/17/2021)   Overall Financial Resource Strain (CARDIA)    Difficulty of Paying Living Expenses: Not hard at all  Food Insecurity: Food Insecurity Present (03/08/2022)   Hunger Vital Sign    Worried About Running Out of Food in the Last Year: Never true    Ran Out of Food in the Last Year: Sometimes true  Transportation Needs: No Transportation Needs (03/08/2022)   PRAPARE - Administrator, Civil Service (Medical): No    Lack of Transportation (Non-Medical): No  Physical Activity: Inactive (03/08/2022)   Exercise Vital Sign    Days of Exercise per Week: 0 days    Minutes of Exercise per Session: 0 min  Stress: Stress Concern Present (03/08/2022)   [employer] of Occupational Health - Occupational Stress Questionnaire    Feeling of Stress : To some extent  Social Connections: Socially Isolated (05/17/2022)   Social Connection and Isolation Panel [NHANES]    Frequency of Communication with Friends and Family: Twice a week    Frequency of Social Gatherings with  Friends and Family: Twice a week    Attends Religious Services: Never    Diplomatic Services operational officer: No    Attends Engineer, structural: Never    Marital Status: Divorced    Objective:  BP (!) 90/50   Pulse 96   Temp 99.4 F (37.4 C)   Resp 14   Ht 5' (1.524 m)   Wt 94 lb (42.6 kg)   SpO2 95%   BMI 18.36 kg/m      06/08/2022    8:09 AM 05/25/2022    8:08 AM 05/17/2022   11:13 AM  BP/Weight  Systolic BP 90 122 108  Diastolic BP 50 68 60  Wt. (Lbs) 94 90 92.5  BMI 18.36 kg/m2 17.58 kg/m2 18.07 kg/m2    Physical Exam Vitals reviewed.  Constitutional:      Appearance: Normal appearance. She is normal weight.  HENT:     Left Ear: Tympanic membrane normal.  Neck:     Vascular: No carotid bruit.  Cardiovascular:     Rate and Rhythm: Normal rate and regular rhythm.     Heart sounds: Normal heart sounds.  Pulmonary:     Effort: Pulmonary  effort is normal. No respiratory distress.     Breath sounds: Normal breath sounds.  Abdominal:     General: Abdomen is flat. Bowel sounds are normal.     Palpations: Abdomen is soft.     Tenderness: There is no abdominal tenderness.  Lymphadenopathy:     Cervical: No cervical adenopathy.  Neurological:     Mental Status: She is alert and oriented to person, place, and time.  Psychiatric:        Mood and Affect: Mood normal.        Behavior: Behavior normal.     Diabetic Foot Exam - Simple   No data filed      Lab Results  Component Value Date   WBC 12.8 (H) 06/08/2022   HGB 12.4 06/08/2022   HCT 37.5 06/08/2022   PLT 246 06/08/2022   GLUCOSE 80 06/08/2022   CHOL 181 05/25/2022   TRIG 100 05/25/2022   HDL 53 05/25/2022   LDLCALC 110 (H) 05/25/2022   ALT 12 06/08/2022   AST 14 06/08/2022   NA 141 06/08/2022   K 4.8 06/08/2022   CL 106 06/08/2022   CREATININE 0.78 06/08/2022   BUN 28 (H) 06/08/2022   CO2 23 06/08/2022   TSH 1.700 02/20/2022      Assessment & Plan:    Other fatigue Assessment & Plan: Check labs  Orders: -     CBC with Differential/Platelet -     Comprehensive metabolic panel  Melena Assessment & Plan: Stool cards given to do over the weekend to check if blood in stool. Checked CBC  Orders: -     CBC with Differential/Platelet  Insomnia due to other mental disorder Assessment & Plan: Increase quetiapine to 100 mg before bed.    Idiopathic hypotension Assessment & Plan: Restart midodrine once daily in am to bring bp up and help with dizziness.  Check bp daily.    Other orders -     QUEtiapine Fumarate; Take 1 tablet (100 mg total) by mouth at bedtime.  Dispense: 90 tablet; Refill: 0     Meds ordered this encounter  Medications   QUEtiapine (SEROQUEL) 100 MG tablet    Sig: Take 1 tablet (100 mg total) by mouth at bedtime.    Dispense:  90 tablet  Refill:  0    Orders Placed This Encounter  Procedures   CBC with  Differential/Platelet   Comprehensive metabolic panel     Follow-up: Return in about 4 weeks (around 07/06/2022) for chronic follow up.   I,Marla I Leal-Borjas,acting as a scribe for Blane Ohara, MD.,have documented all relevant documentation on the behalf of Blane Ohara, MD,as directed by  Blane Ohara, MD while in the presence of Blane Ohara, MD.   An After Visit Summary was printed and given to the patient.  Blane Ohara, MD [facility] [phone]

## 2022-06-08 NOTE — Assessment & Plan Note (Signed)
Increase quetiapine to 100 mg before bed.

## 2022-06-08 NOTE — Patient Instructions (Signed)
For sleep and depression: increase quetiapine to 100 mg before bed. (You may use up quetiapine 50 mg 2 at night,then start higher dose.   Restart midodrine once daily in am to bring bp up and help with dizziness.   Check bp daily.   Stool cards given to do over the weekend to check if blood in stool.

## 2022-06-08 NOTE — Assessment & Plan Note (Signed)
Check labs 

## 2022-06-08 NOTE — Assessment & Plan Note (Signed)
Stool cards given to do over the weekend to check if blood in stool. Checked CBC

## 2022-06-19 ENCOUNTER — Ambulatory Visit (INDEPENDENT_AMBULATORY_CARE_PROVIDER_SITE_OTHER): Payer: Medicare PPO | Admitting: Family Medicine

## 2022-06-19 VITALS — BP 114/62 | HR 103 | Temp 97.5°F | Ht 60.0 in | Wt 91.0 lb

## 2022-06-19 DIAGNOSIS — I950 Idiopathic hypotension: Secondary | ICD-10-CM

## 2022-06-19 DIAGNOSIS — M810 Age-related osteoporosis without current pathological fracture: Secondary | ICD-10-CM | POA: Diagnosis not present

## 2022-06-19 DIAGNOSIS — G479 Sleep disorder, unspecified: Secondary | ICD-10-CM | POA: Diagnosis not present

## 2022-06-19 DIAGNOSIS — E43 Unspecified severe protein-calorie malnutrition: Secondary | ICD-10-CM

## 2022-06-19 DIAGNOSIS — G43109 Migraine with aura, not intractable, without status migrainosus: Secondary | ICD-10-CM | POA: Diagnosis not present

## 2022-06-19 DIAGNOSIS — F332 Major depressive disorder, recurrent severe without psychotic features: Secondary | ICD-10-CM

## 2022-06-19 DIAGNOSIS — I700 Atherosclerosis of aorta: Secondary | ICD-10-CM | POA: Diagnosis not present

## 2022-06-19 DIAGNOSIS — J410 Simple chronic bronchitis: Secondary | ICD-10-CM | POA: Diagnosis not present

## 2022-06-19 DIAGNOSIS — R0602 Shortness of breath: Secondary | ICD-10-CM

## 2022-06-19 DIAGNOSIS — E538 Deficiency of other specified B group vitamins: Secondary | ICD-10-CM

## 2022-06-19 MED ORDER — SERTRALINE HCL 100 MG PO TABS: 100.0000 mg | ORAL_TABLET | Freq: Every day | ORAL | 0 refills | Status: DC

## 2022-06-19 MED ORDER — ALPRAZOLAM 0.25 MG PO TABS
0.2500 mg | ORAL_TABLET | Freq: Two times a day (BID) | ORAL | 0 refills | Status: DC | PRN
Start: 2022-06-19 — End: 2022-07-06

## 2022-06-19 MED ORDER — ANORO ELLIPTA 62.5-25 MCG/ACT IN AEPB
1.0000 | INHALATION_SPRAY | Freq: Every day | RESPIRATORY_TRACT | 3 refills | Status: DC
Start: 2022-06-19 — End: 2022-07-06

## 2022-06-19 NOTE — Patient Instructions (Addendum)
B12 DEFICIENCY:  MUST GET B12 SHOT MONTHLY.  DEPRESSION: INCREASE ZOLOFT TO 100 MG DAILY.  STOP QUETIAPINE.   COPD: STOP TRELEGY. START ANORO ONE INHALATION DAILY.   INSOMNIA: STOP TRAZODONE (CAUSING DRY MOUTH.) CONTINUE MIRTAZAPINE 45 MG BEFORE BED.  START XANAX 0.25 MG ONE TWICE DAILY AS NEEDED FOR ANXIETY AND INSOMNIA.

## 2022-06-19 NOTE — Progress Notes (Unsigned)
Acute Office Visit  Subjective:    Patient ID: Anne [name], female    DOB: 1945/08/02, 77 y.o.   MRN: [id number]  Chief Complaint  Patient presents with   Breathing Problem    HPI: Patient is in today for possible long covid symptoms presents with a list of her symptoms states she has "leaking bowels (without warning), voice issues, weight loss, dehydration (sticky saliva), cough worse than usual, fatigue (always tired), breathing issues are worse, migraines 2-3 a week, memory issues, brain fog, hard to concentrate, dizzy upon standing, sense of taste and smell; gone, Anxiety, sleeping only 3-4 hours if lucky, sweating at front of neck and tremors -hands jerking"  Prior to sleep, patient watches TV and does phone APPs.  Eats 7-9 pm at night.  Goes to sleep about 11:30-12 am.  Room is dark.  Bed is comfortable. Takes her 1 hour to go to sleep and if she does not go to sleep she gets up. IF she does, she is awake 2 hours after that.  Patient cannot turn her thoughts off.  Failed on rozerem, ambien, belsomra.  Currently on amitripytline, seroquel, remeron. Trazodone.  Tobacco cessation: off chantix. It helped, but cost was too high.   COPD: on trelegy. Uses albuterol hfa four times a day as needed. Has been treated with antibiotics and steroids nearly monthly for the last 6 months and continues to have dyspnea and cough      Past Medical History:  Diagnosis Date   Acquired absence of both breasts and nipples 06/08/2015   Acute infection of nasal sinus 05/04/2021   Aortic atherosclerosis 01/30/2021   Asthma    daily inhalers   Ataxia 10/18/2020   Bilateral carpal tunnel syndrome 01/30/2021   Bipolar disorder, mixed 06/16/2021   Breast cancer of upper-inner quadrant of left female breast 04/15/2015   Carotid artery disease 05/04/2016   Mild, 1-39% bilaterally by doppler in March 2018   Chronic pain in right shoulder 12/25/2020   COPD with acute exacerbation 07/03/2016    Dental crowns present    Drug induced constipation 04/10/2021   EIC (epidermal inclusion cyst) 03/21/2021   Family history of breast cancer in female 04/26/2015   Dx. Niece at 27; dx. Maternal aunt in her late 21s  Formatting of this note might be different from the original. Overview:  Dx. Niece at 36; dx. Maternal aunt in her late 32s   Family history of colon cancer 04/26/2015   Dx. In maternal grandfather in his late 65s-70  Formatting of this note might be different from the original. Overview:  Dx. In maternal grandfather in his late 74s-70   Frequent falls 10/18/2020   Genetic testing 06/02/2015   Negative for pathogenic mutations within any of 20 genes on the Breast/Ovarian Cancer Panel through [employer].  No variants of uncertain significance (VUSes) were found.  The Breast/Ovarian Cancer Panel offered by [facility] Basilio Cairo, MD) includes sequencing and deletion/duplication analysis for the following 19 genes:  ATM, BARD1, BRCA1, BRCA2, BRIP1, CDH1, CHEK2, FANCC, M   History of basal cell carcinoma 04/26/2015   History of concussion 01/01/2014   History of kidney stones    History of TIA (transient ischemic attack) 10/2014   Hyperlipidemia    Insomnia    Lumbar back pain 10/18/2020   Major depressive disorder 10/18/2020   Malrotation of intestine 09/23/2017   Migraine aura without headache 02/08/2014   Migraine headache 01/30/2021   Nasal congestion 05/26/2015   will finish  z-pak 05/27/2015   Nonproductive cough 05/26/2015   PONV (postoperative nausea and vomiting)    "long time ago"  none recently   Psychogenic syncope 04/23/2019   Right lumbar radiculopathy 04/23/2019   RLS (restless legs syndrome)    Simple chronic bronchitis 07/04/2019   Tobacco use    Tremor 10/18/2020   Vitamin D deficiency    Wears dentures    Weight loss 08/28/2019    Past Surgical History:  Procedure Laterality Date   ABDOMINAL HYSTERECTOMY     APPENDECTOMY  2000    BREAST BIOPSY Left    BREAST RECONSTRUCTION WITH PLACEMENT OF TISSUE EXPANDER AND FLEX HD (ACELLULAR HYDRATED DERMIS) Bilateral 06/02/2015   Procedure: BILATERAL BREAST RECONSTRUCTION WITH PLACEMENT OF TISSUE EXPANDER AND  ACELLULAR HYDRATED DERMIS;  Surgeon: Glenna Fellows, MD;  Location: [facility];  Service: Plastics;  Laterality: Bilateral;   CARPAL TUNNEL RELEASE Bilateral    CATARACT EXTRACTION Bilateral 09/2013   KNEE ARTHROSCOPY Left    LAPAROSCOPIC ABDOMINAL EXPLORATION N/A 10/2017   LAPAROSCOPIC LYSIS OF CONGENITAL ADHESIVE BAND/LADD'S BANDS WIH INTRAOPERATIVE CHOLANGIOGRAPHY. Perforation of small intestion requiring repair.    LOOP RECORDER INSERTION N/A 05/11/2016   Procedure: Loop Recorder Insertion;  Surgeon: Will Jorja Loa, MD;  Location: [facility];  Service: Cardiovascular;  Laterality: N/A;   MASTECTOMY W/ SENTINEL NODE BIOPSY Bilateral 06/02/2015   Procedure: BILATERAL MASTECTOMY WITH LEFT SENTINEL LYMPH NODE BIOPSY;  Surgeon: Almond Lint, MD;  Location: [facility];  Service: General;  Laterality: Bilateral;   NASAL SEPTUM SURGERY     x 3   PLANTAR FASCIA SURGERY Left    REMOVAL OF BILATERAL TISSUE EXPANDERS WITH PLACEMENT OF BILATERAL BREAST IMPLANTS Bilateral 06/17/2015   Procedure: DEBRIDEMENT OF BILATERAL MASTECTOMY FLAP WITH BILATERAL TISSUE EXPANDER EXCHANGE ;  Surgeon: Glenna Fellows, MD;  Location: [facility];  Service: Plastics;  Laterality: Bilateral;   REMOVAL OF TISSUE EXPANDER Bilateral 07/12/2015   Procedure: REMOVAL OF BILATERAL TISSUE EXPANDERS;  Surgeon: Glenna Fellows, MD;  Location: [facility];  Service: Plastics;  Laterality: Bilateral;    Family History  Problem Relation Age of Onset   Cervical cancer Mother 47   Colon cancer Maternal Grandfather        mets to stomach; dx. 67-70   Heart disease Father    Prostate cancer Father 12   Cervical cancer Maternal Grandmother        dx. 30s; treated with  radium implant   Lung cancer Sister 75       maternal half-sister dx. lung cancer, stage III   Breast cancer Maternal Aunt    Cervical cancer Sister 21       paternal half-sister; s/p TAH   Spina bifida Grandchild    Brain cancer Grandchild        grandson dx. at 20 mos, treated at [facility]   Breast cancer Other 51       maternal half-sister's daughter   Cancer Maternal Aunt        d. 26; unspecified type - "began at back area and moved to vital organs"   Cancer Maternal Uncle         late 26s; unspecified type; "started at back and moved to vital organs"    Social History   Socioeconomic History   Marital status: Widowed    Spouse name: Not on file   Number of children: 1   Years of education: 12   Highest education level: Not on  file  Occupational History    Comment: [employer], retired  Tobacco Use   Smoking status: Every Day    Packs/day: .25    Types: Cigarettes   Smokeless tobacco: Never   Tobacco comments:    Currently wearing nicotine patches  Vaping Use   Vaping Use: Never used  Substance and Sexual Activity   Alcohol use: Never   Drug use: No   Sexual activity: Not Currently  Other Topics Concern   Not on file  Social History Narrative   Lives alone, widow   Caffeine use- tea, 1 cup daily   Right Handed    Lives in a one story [place] home    Social Determinants of Health   Financial Resource Strain: Low Risk  (02/17/2021)   Overall Financial Resource Strain (CARDIA)    Difficulty of Paying Living Expenses: Not hard at all  Food Insecurity: Food Insecurity Present (03/08/2022)   Hunger Vital Sign    Worried About Running Out of Food in the Last Year: Never true    Ran Out of Food in the Last Year: Sometimes true  Transportation Needs: No Transportation Needs (03/08/2022)   PRAPARE - Administrator, Civil Service (Medical): No    Lack of Transportation (Non-Medical): No  Physical Activity: Inactive (03/08/2022)   Exercise Vital Sign     Days of Exercise per Week: 0 days    Minutes of Exercise per Session: 0 min  Stress: Stress Concern Present (03/08/2022)   [employer] of Occupational Health - Occupational Stress Questionnaire    Feeling of Stress : To some extent  Social Connections: Socially Isolated (05/17/2022)   Social Connection and Isolation Panel [NHANES]    Frequency of Communication with Friends and Family: Twice a week    Frequency of Social Gatherings with Friends and Family: Twice a week    Attends Religious Services: Never    Database administrator or Organizations: No    Attends Banker Meetings: Never    Marital Status: Divorced  Catering manager Violence: Not At Risk (05/17/2022)   Humiliation, Afraid, Rape, and Kick questionnaire    Fear of Current or Ex-Partner: No    Emotionally Abused: No    Physically Abused: No    Sexually Abused: No    Outpatient Medications Prior to Visit  Medication Sig Dispense Refill   albuterol (PROVENTIL) (2.5 MG/3ML) 0.083% nebulizer solution Take 2.5 mg by nebulization 4 (four) times daily.     albuterol (VENTOLIN HFA) 108 (90 Base) MCG/ACT inhaler INHALE 1-2 PUFFS BY MOUTH into THE lungs every SIX hours AS NEEDED FOR WHEEZING AND/OR SHORTNESS OF BREATH 8.5 g 2   amitriptyline (ELAVIL) 75 MG tablet TAKE 1 TABLET EVERY DAY 90 tablet 0   atorvastatin (LIPITOR) 10 MG tablet Take 1 tablet (10 mg total) by mouth daily. 30 tablet 2   co-enzyme Q-10 30 MG capsule Take 30 mg by mouth 3 (three) times daily.     cyanocobalamin (VITAMIN B12) 1000 MCG/ML injection INJECT 1 ML INTRAMUSCULARLY  ONCE EVERY MONTH 3 mL 0   famotidine (PEPCID) 20 MG tablet Take 1 tablet (20 mg total) by mouth 2 (two) times daily. 180 tablet 3   Fluticasone-Umeclidin-Vilant (TRELEGY ELLIPTA) 200-62.5-25 MCG/ACT AEPB Inhale 1 puff into the lungs daily. 3 each 3   gabapentin (NEURONTIN) 300 MG capsule TAKE TWO CAPSULES BY MOUTH EVERY MORNING and TAKE TWO CAPSULES BY MOUTH AT NOON and TAKE TWO  CAPSULES BY MOUTH EVERYDAY  AT BEDTIME 180 capsule 2   HYDROcodone-acetaminophen (NORCO/VICODIN) 5-325 MG tablet TAKE ONE TABLET BY MOUTH daily AS NEEDED FOR SEVERE pain (migranes) 30 tablet 0   loratadine (CLARITIN) 10 MG tablet Take 1 tablet (10 mg total) by mouth daily. 90 tablet 3   megestrol (MEGACE) 20 MG tablet Take 2 tablets (40 mg total) by mouth daily. 180 tablet 0   melatonin 10 MG TABS Take 20 mg by mouth at bedtime. 1 tablet 0   meloxicam (MOBIC) 15 MG tablet Take 1 tablet (15 mg total) by mouth daily. 90 tablet 0   methocarbamol (ROBAXIN) 750 MG tablet Take 1 tablet (750 mg total) by mouth in the morning and at bedtime. 60 tablet 0   midodrine (PROAMATINE) 2.5 MG tablet TAKE 1 TABLET THREE TIMES DAILY WITH MEALS 270 tablet 0   mirtazapine (REMERON) 45 MG tablet TAKE 1 TABLET AT BEDTIME 90 tablet 3   montelukast (SINGULAIR) 10 MG tablet Take 1 tablet (10 mg total) by mouth daily. 90 tablet 1   omeprazole (PRILOSEC) 40 MG capsule Take 1 capsule (40 mg total) by mouth 2 (two) times daily. 180 capsule 0   QUEtiapine (SEROQUEL) 100 MG tablet Take 1 tablet (100 mg total) by mouth at bedtime. 90 tablet 0   rizatriptan (MAXALT-MLT) 10 MG disintegrating tablet DISSOLVE 1 TABLET ON THE TONGUE AT ONSET OF MIGRAINE, REPEAT IN 2 HOURS IF NEEDED 10 tablet 2   triamcinolone (KENALOG) 0.025 % cream Apply 1 Application topically 2 (two) times daily. 30 g 0   Vitamin D, Ergocalciferol, (DRISDOL) 1.25 MG (50000 UNIT) CAPS capsule TAKE 1 CAPSULE EVERY 7 DAYS. 12 capsule 1   Atogepant ([name]) 60 MG TABS Take 1 tablet (60 mg total) by mouth daily. 30 tablet 2   predniSONE (DELTASONE) 50 MG tablet Take 1 tablet (50 mg total) by mouth daily with breakfast. 5 tablet 0   sertraline (ZOLOFT) 50 MG tablet Take 1 tablet (50 mg total) by mouth daily. 90 tablet 0   traZODone (DESYREL) 100 MG tablet Take 1 tablet (100 mg total) by mouth at bedtime as needed. for sleep 90 tablet 1   Varenicline Tartrate, Starter,  (CHANTIX STARTING MONTH PAK) 0.5 MG X 11 & 1 MG X 42 TBPK Take 0.5 mg by mouth daily for 3 days, THEN 0.5 mg 2 (two) times daily for 4 days, THEN 1 mg 2 (two) times daily for 21 days. 1 each 0   No facility-administered medications prior to visit.    Allergies  Allergen Reactions   Clindamycin/Lincomycin Nausea And Vomiting   Lyrica [Pregabalin] Other (See Comments)    PERSONALITY CHANGE    Review of Systems  All other systems reviewed and are negative.      Objective:        06/19/2022    3:48 PM 06/08/2022    8:09 AM 05/25/2022    8:08 AM  Vitals with BMI  Height     Weight 91 lbs 94 lbs 90 lbs  BMI 17.77 18.36 17.58  Systolic 114 90 122  Diastolic 62 50 68  Pulse 103 96 66    Orthostatic VS for the past 72 hrs (Last 3 readings):  Orthostatic BP Patient Position Orthostatic Pulse  06/19/22 1635 110/58 Standing 96  06/19/22 1634 108/62 Sitting 94  06/19/22 1633 118/76 Supine 93     Physical Exam Vitals reviewed.  Constitutional:      General: She is not in acute distress.  Appearance: Normal appearance.     Comments: thin  HENT:     Right Ear: Tympanic membrane, ear canal and external ear normal.     Left Ear: Tympanic membrane, ear canal and external ear normal.     Nose: Nose normal. No congestion or rhinorrhea.     Mouth/Throat:     Pharynx: No oropharyngeal exudate or posterior oropharyngeal erythema.  Neck:     Thyroid: No thyroid mass.     Vascular: No carotid bruit.  Cardiovascular:     Rate and Rhythm: Normal rate and regular rhythm.     Heart sounds: Normal heart sounds. No murmur heard. Pulmonary:     Effort: Pulmonary effort is normal.     Breath sounds: Wheezing (diffuse mild expiratory wheezes. greatest in upper lobes.) present.  Abdominal:     General: Bowel sounds are normal.     Palpations: Abdomen is soft. There is no mass.     Tenderness: There is no abdominal tenderness.  Lymphadenopathy:     Cervical: No cervical  adenopathy.  Neurological:     Mental Status: She is alert and oriented to person, place, and time.  Psychiatric:        Mood and Affect: Mood normal.        Behavior: Behavior normal.     Health Maintenance Due  Topic Date Due   Hepatitis C Screening  Never done   COVID-19 Vaccine (3 - Moderna risk series) 11/26/2021   Zoster Vaccines- Shingrix (2 of 2) 04/02/2022   Lung Cancer Screening  05/30/2022    There are no preventive care reminders to display for this patient.   Lab Results  Component Value Date   TSH 1.700 02/20/2022   Lab Results  Component Value Date   WBC 12.8 (H) 06/08/2022   HGB 12.4 06/08/2022   HCT 37.5 06/08/2022   MCV 90 06/08/2022   PLT 246 06/08/2022   Lab Results  Component Value Date   NA 141 06/08/2022   K 4.8 06/08/2022   CO2 23 06/08/2022   GLUCOSE 80 06/08/2022   BUN 28 (H) 06/08/2022   CREATININE 0.78 06/08/2022   BILITOT 0.3 06/08/2022   ALKPHOS 93 06/08/2022   AST 14 06/08/2022   ALT 12 06/08/2022   PROT 6.0 06/08/2022   ALBUMIN 4.3 06/08/2022   CALCIUM 9.4 06/08/2022   ANIONGAP 3 (L) 07/03/2016   EGFR 79 06/08/2022   Lab Results  Component Value Date   CHOL 181 05/25/2022   Lab Results  Component Value Date   HDL 53 05/25/2022   Lab Results  Component Value Date   LDLCALC 110 (H) 05/25/2022   Lab Results  Component Value Date   TRIG 100 05/25/2022   Lab Results  Component Value Date   CHOLHDL 3.4 05/25/2022   No results found for: "HGBA1C"     Assessment & Plan:  Simple chronic bronchitis Assessment & Plan: Not adequately controlled.  I will drop the Trelegy down to Anoro 1 inhalation daily due to patient's persistent hoarseness.  May need referral to ENT for hoarseness.  Order cxr and ct of chest.  Orders: -     Anoro Ellipta; Inhale 1 puff into the lungs daily.  Dispense: 60 each; Refill: 3 -     CT CHEST WO CONTRAST; Future  Shortness of breath Assessment & Plan: Order chest x-ray. Ordered CT scan  chest due to persistent issues.  Orders: -     DG Chest 2 View; Future  Age-related osteoporosis without current pathological fracture  Aortic atherosclerosis Assessment & Plan: Continue atorvastatin 10 mg nightly.   Migraine aura without headache Assessment & Plan: Continue [name]. Patient has Maxalt for breakthrough migraines. Likely increasing migraines is related to poor sleep and anxiety.  Will adjust medicines.   Idiopathic hypotension Assessment & Plan: Continue midodrine 3 times a day   Sleep disorder Assessment & Plan:  STOP TRAZODONE (CAUSING DRY MOUTH.) CONTINUE MIRTAZAPINE 45 MG BEFORE BED.  START XANAX 0.25 MG ONE TWICE DAILY AS NEEDED FOR ANXIETY AND INSOMNIA.    Orders: -     ALPRAZolam; Take 1 tablet (0.25 mg total) by mouth 2 (two) times daily as needed for anxiety.  Dispense: 60 tablet; Refill: 0  Severe episode of recurrent major depressive disorder, without psychotic features Assessment & Plan: INCREASE ZOLOFT TO 100 MG DAILY.  STOP QUETIAPINE.   Orders: -     ALPRAZolam; Take 1 tablet (0.25 mg total) by mouth 2 (two) times daily as needed for anxiety.  Dispense: 60 tablet; Refill: 0 -     Sertraline HCl; Take 1 tablet (100 mg total) by mouth daily.  Dispense: 90 tablet; Refill: 0  Severe protein-calorie malnutrition Lily Kocher: less than 60% of standard weight)  B12 deficiency Assessment & Plan: MUST GET B12 SHOT MONTHLY.      Meds ordered this encounter  Medications   ALPRAZolam (XANAX) 0.25 MG tablet    Sig: Take 1 tablet (0.25 mg total) by mouth 2 (two) times daily as needed for anxiety.    Dispense:  60 tablet    Refill:  0   umeclidinium-vilanterol (ANORO ELLIPTA) 62.5-25 MCG/ACT AEPB    Sig: Inhale 1 puff into the lungs daily.    Dispense:  60 each    Refill:  3   sertraline (ZOLOFT) 100 MG tablet    Sig: Take 1 tablet (100 mg total) by mouth daily.    Dispense:  90 tablet    Refill:  0    This prescription was filled on  12/01/2021. Any refills authorized will be placed on file.    Orders Placed This Encounter  Procedures   DG Chest 2 View   CT Chest Wo Contrast     Follow-up: Return in about 4 weeks (around 07/17/2022) for chronic follow up.  An After Visit Summary was printed and given to the patient.  Blane Ohara, MD [facility] [phone]

## 2022-06-20 ENCOUNTER — Other Ambulatory Visit: Payer: Self-pay | Admitting: Family Medicine

## 2022-06-20 ENCOUNTER — Encounter: Payer: Self-pay | Admitting: Family Medicine

## 2022-06-20 ENCOUNTER — Other Ambulatory Visit: Payer: Self-pay | Admitting: Physician Assistant

## 2022-06-20 DIAGNOSIS — R0602 Shortness of breath: Secondary | ICD-10-CM | POA: Insufficient documentation

## 2022-06-20 DIAGNOSIS — F332 Major depressive disorder, recurrent severe without psychotic features: Secondary | ICD-10-CM | POA: Insufficient documentation

## 2022-06-20 NOTE — Assessment & Plan Note (Signed)
Continue [name]. Patient has Maxalt for breakthrough migraines. Likely increasing migraines is related to poor sleep and anxiety.  Will adjust medicines.

## 2022-06-20 NOTE — Assessment & Plan Note (Addendum)
Not adequately controlled.  I will drop the Trelegy down to Anoro 1 inhalation daily due to patient's persistent hoarseness.  May need referral to ENT for hoarseness.  Order cxr and ct of chest.

## 2022-06-20 NOTE — Assessment & Plan Note (Signed)
INCREASE ZOLOFT TO 100 MG DAILY.  STOP QUETIAPINE.

## 2022-06-20 NOTE — Assessment & Plan Note (Signed)
STOP TRAZODONE (CAUSING DRY MOUTH.) CONTINUE MIRTAZAPINE 45 MG BEFORE BED.  START XANAX 0.25 MG ONE TWICE DAILY AS NEEDED FOR ANXIETY AND INSOMNIA.

## 2022-06-20 NOTE — Assessment & Plan Note (Signed)
Order chest x-ray. Ordered CT scan chest due to persistent issues.

## 2022-06-20 NOTE — Assessment & Plan Note (Signed)
-  Continue atorvastatin 10 mg nightly

## 2022-06-20 NOTE — Assessment & Plan Note (Signed)
Continue midodrine 3 times a day

## 2022-06-20 NOTE — Assessment & Plan Note (Signed)
MUST GET B12 SHOT MONTHLY.

## 2022-06-21 DIAGNOSIS — J449 Chronic obstructive pulmonary disease, unspecified: Secondary | ICD-10-CM | POA: Diagnosis not present

## 2022-06-21 DIAGNOSIS — R918 Other nonspecific abnormal finding of lung field: Secondary | ICD-10-CM | POA: Diagnosis not present

## 2022-06-21 DIAGNOSIS — R0602 Shortness of breath: Secondary | ICD-10-CM | POA: Diagnosis not present

## 2022-06-28 ENCOUNTER — Other Ambulatory Visit: Payer: Self-pay

## 2022-06-28 DIAGNOSIS — E782 Mixed hyperlipidemia: Secondary | ICD-10-CM

## 2022-06-28 MED ORDER — ATORVASTATIN CALCIUM 10 MG PO TABS
10.0000 mg | ORAL_TABLET | Freq: Every day | ORAL | 2 refills | Status: DC
Start: 2022-06-28 — End: 2022-07-05

## 2022-06-29 ENCOUNTER — Other Ambulatory Visit: Payer: Self-pay

## 2022-06-29 ENCOUNTER — Telehealth: Payer: Self-pay

## 2022-06-29 DIAGNOSIS — M5416 Radiculopathy, lumbar region: Secondary | ICD-10-CM

## 2022-06-29 MED ORDER — HYDROCODONE-ACETAMINOPHEN 5-325 MG PO TABS
ORAL_TABLET | ORAL | 0 refills | Status: DC
Start: 2022-06-29 — End: 2022-07-26

## 2022-06-29 NOTE — Progress Notes (Unsigned)
Care Management & Coordination Services Pharmacy Team  Reason for Encounter: Medication coordination and delivery  Contacted patient to discuss medications and coordinate delivery from [facility]. {US HC Outreach:28874} Cycle dispensing form sent to *** for review.   Last adherence delivery date: 06-05-2022  Patient is due for next adherence delivery on: 07-05-2022  This delivery to include: Adherence Packaging  30 Days  cetirizine 10 mg- 1 tablet at breakfast  Trelegy Gabapentin 300 mg- 2 at breakfast, 2 at lunch, 2 at bedtime Megestrol Ac 20 mg- 2 tablets before breakfast   Amitriptyline 75 mg at bedtime Trazodone 100 mg- 1 at bedtime ? Omeprazole 40 mg- 1 at breakfast and 1 with evening meal Meloxicam 15 mg- 1 at breakfast Mirtazapine 45 mg- 1 at breakfast Midodrine 2.5 mg 1 at breakfast, 1 at lunch, 1 with evening meals  Albuterol inhaler Maxalt 10 mg DISSOLVE 1 TABLET ON THE TONGUE AT ONSET OF MIGRAINE, REPEAT IN 2 HOURS IF NEEDED (Vials) Hydrocodone 5-325 mg- Take 1 tablet by mouth daily as needed for severe pain (migraines) [name] 60 mg 1 at breakfast  Patient declined the following medications this month: Quetiapine- [facility] filled 100 mg 06-07-2022 90 DS  Refills requested from providers include: Hydrocodone   {Delivery date:25786}   Any concerns about your medications? {yes/no:20286}  How often do you forget or accidentally miss a dose? {Missed doses:25554}  Do you use a pillbox? {yes/no:20286}  Is patient in packaging {yes/no:20286}  If yes  What is the date on your next pill pack?  Any concerns or issues with your packaging?   Recent blood pressure readings are as follows:***  Recent blood glucose readings are as follows:***   Chart review: Recent office visits:  06-19-2022 Blane Ohara, MD. DG chest view completed. CT chest ordered. Start zanax 0.25 mg twice daily PRN, anoro ellipta 1 puff dialy and increase zoloft 50 mg daily to 100 mg  daily. Stopped prednisone, chantix and trazodone.  06-08-2022 Cox, Fritzi Mandes, MD. WBC= 12.8, RDW= 15.6, Neutrophils Absolute= 9.4, Monocytes Absolute= 1.0. BUN= 28, BUN/Creatinine= 36, Albumin/Globulin Ratio= 2.5. Increase seroquel 50 mg at bedtime to 100 mg at bedtime.  05-25-2022 Blane Ohara, MD. Neutrophils Absolute= 7.3, Monocytes Absolute= 1.0. Albumin/Globulin Ratio= 2.3. LDL= 110. START loratadine 10 mg daily and chantix. Finished amoxicillin, tessalon, zyrtec and promethazine. Reported not taking nicotine patch.   05-17-2022 Blane Ohara, MD. Negative covid test. START augmentin 875-125 mg twice daily, prednisone 50 mg daily and promethazine-codeine 5 mLs every 6 hours PRN. INCREASE quetiapine 50 mg at bedtime to 100 mg at bedtime. Kenelog and rocepin given.   Recent consult visits:  None  Hospital visits:  None in previous 6 months  Medications: Outpatient Encounter Medications as of 06/29/2022  Medication Sig   albuterol (PROVENTIL) (2.5 MG/3ML) 0.083% nebulizer solution Take 2.5 mg by nebulization 4 (four) times daily.   albuterol (VENTOLIN HFA) 108 (90 Base) MCG/ACT inhaler INHALE 1-2 PUFFS BY MOUTH into THE lungs every SIX hours AS NEEDED FOR WHEEZING AND/OR SHORTNESS OF BREATH   ALPRAZolam (XANAX) 0.25 MG tablet Take 1 tablet (0.25 mg total) by mouth 2 (two) times daily as needed for anxiety.   amitriptyline (ELAVIL) 75 MG tablet TAKE 1 TABLET EVERY DAY   atorvastatin (LIPITOR) 10 MG tablet Take 1 tablet (10 mg total) by mouth daily.   co-enzyme Q-10 30 MG capsule Take 30 mg by mouth 3 (three) times daily.   cyanocobalamin (VITAMIN B12) 1000 MCG/ML injection INJECT 1 ML INTRAMUSCULARLY  ONCE EVERY  MONTH   famotidine (PEPCID) 20 MG tablet Take 1 tablet (20 mg total) by mouth 2 (two) times daily.   Fluticasone-Umeclidin-Vilant (TRELEGY ELLIPTA) 200-62.5-25 MCG/ACT AEPB Inhale 1 puff into the lungs daily.   gabapentin (NEURONTIN) 300 MG capsule TAKE TWO CAPSULES BY MOUTH EVERY  MORNING and TAKE TWO CAPSULES BY MOUTH AT NOON and TAKE TWO CAPSULES BY MOUTH EVERYDAY AT BEDTIME   HYDROcodone-acetaminophen (NORCO/VICODIN) 5-325 MG tablet TAKE ONE TABLET BY MOUTH daily AS NEEDED FOR SEVERE pain (migranes)   loratadine (CLARITIN) 10 MG tablet Take 1 tablet (10 mg total) by mouth daily.   megestrol (MEGACE) 20 MG tablet Take 2 tablets (40 mg total) by mouth daily.   melatonin 10 MG TABS Take 20 mg by mouth at bedtime.   meloxicam (MOBIC) 15 MG tablet Take 1 tablet (15 mg total) by mouth daily.   methocarbamol (ROBAXIN) 750 MG tablet Take 1 tablet (750 mg total) by mouth in the morning and at bedtime.   midodrine (PROAMATINE) 2.5 MG tablet TAKE 1 TABLET THREE TIMES DAILY WITH MEALS   mirtazapine (REMERON) 45 MG tablet TAKE 1 TABLET AT BEDTIME   montelukast (SINGULAIR) 10 MG tablet Take 1 tablet (10 mg total) by mouth daily.   omeprazole (PRILOSEC) 40 MG capsule Take 1 capsule (40 mg total) by mouth 2 (two) times daily.   QUEtiapine (SEROQUEL) 100 MG tablet Take 1 tablet (100 mg total) by mouth at bedtime.   [name] 60 MG TABS TAKE ONE TABLET BY MOUTH ONCE DAILY   rizatriptan (MAXALT-MLT) 10 MG disintegrating tablet Take 1 tablet at onset of headache. May repeat 1 dose in 2 hours if needed. Max 2 tabs in 24 hours.   sertraline (ZOLOFT) 100 MG tablet Take 1 tablet (100 mg total) by mouth daily.   triamcinolone (KENALOG) 0.025 % cream Apply 1 Application topically 2 (two) times daily.   umeclidinium-vilanterol (ANORO ELLIPTA) 62.5-25 MCG/ACT AEPB Inhale 1 puff into the lungs daily.   Vitamin D, Ergocalciferol, (DRISDOL) 1.25 MG (50000 UNIT) CAPS capsule TAKE 1 CAPSULE EVERY 7 DAYS.   No facility-administered encounter medications on file as of 06/29/2022.   BP Readings from Last 3 Encounters:  06/19/22 114/62  06/08/22 (!) 90/50  05/25/22 122/68    Pulse Readings from Last 3 Encounters:  06/19/22 (!) 103  06/08/22 96  05/25/22 66    No results found for: "HGBA1C" Lab  Results  Component Value Date   CREATININE 0.78 06/08/2022   BUN 28 (H) 06/08/2022   GFRNONAA 77 03/01/2020   GFRAA 89 03/01/2020   NA 141 06/08/2022   K 4.8 06/08/2022   CALCIUM 9.4 06/08/2022   CO2 23 06/08/2022  06-29-2022: 1st attempt left VM 07-02-2022: 2nd attempt left VM  [facility] CMA Clinical Pharmacist Assistant [phone]

## 2022-07-05 ENCOUNTER — Ambulatory Visit: Payer: Medicare PPO | Admitting: Family Medicine

## 2022-07-05 ENCOUNTER — Other Ambulatory Visit: Payer: Self-pay | Admitting: Family Medicine

## 2022-07-05 ENCOUNTER — Other Ambulatory Visit: Payer: Self-pay

## 2022-07-05 DIAGNOSIS — F332 Major depressive disorder, recurrent severe without psychotic features: Secondary | ICD-10-CM

## 2022-07-05 DIAGNOSIS — E782 Mixed hyperlipidemia: Secondary | ICD-10-CM

## 2022-07-05 DIAGNOSIS — M5416 Radiculopathy, lumbar region: Secondary | ICD-10-CM

## 2022-07-05 MED ORDER — ATORVASTATIN CALCIUM 10 MG PO TABS: 10.0000 mg | ORAL_TABLET | Freq: Every day | ORAL | 1 refills | Status: DC

## 2022-07-05 MED ORDER — ATORVASTATIN CALCIUM 10 MG PO TABS
10.0000 mg | ORAL_TABLET | Freq: Every day | ORAL | 2 refills | Status: DC
Start: 2022-07-05 — End: 2022-07-05

## 2022-07-05 MED ORDER — SERTRALINE HCL 100 MG PO TABS: 100.0000 mg | ORAL_TABLET | Freq: Every day | ORAL | 0 refills | Status: DC

## 2022-07-05 MED ORDER — METHOCARBAMOL 750 MG PO TABS
750.0000 mg | ORAL_TABLET | Freq: Two times a day (BID) | ORAL | 0 refills | Status: DC
Start: 2022-07-05 — End: 2022-07-30

## 2022-07-06 ENCOUNTER — Ambulatory Visit: Payer: Medicare PPO | Admitting: Family Medicine

## 2022-07-06 ENCOUNTER — Other Ambulatory Visit: Payer: Self-pay

## 2022-07-06 DIAGNOSIS — J301 Allergic rhinitis due to pollen: Secondary | ICD-10-CM

## 2022-07-06 DIAGNOSIS — F332 Major depressive disorder, recurrent severe without psychotic features: Secondary | ICD-10-CM

## 2022-07-06 DIAGNOSIS — J410 Simple chronic bronchitis: Secondary | ICD-10-CM

## 2022-07-06 DIAGNOSIS — L509 Urticaria, unspecified: Secondary | ICD-10-CM

## 2022-07-06 DIAGNOSIS — E782 Mixed hyperlipidemia: Secondary | ICD-10-CM

## 2022-07-06 DIAGNOSIS — G479 Sleep disorder, unspecified: Secondary | ICD-10-CM

## 2022-07-06 MED ORDER — FAMOTIDINE 20 MG PO TABS
20.0000 mg | ORAL_TABLET | Freq: Two times a day (BID) | ORAL | 3 refills | Status: DC
Start: 2022-07-06 — End: 2022-08-17

## 2022-07-06 MED ORDER — ATORVASTATIN CALCIUM 10 MG PO TABS
10.0000 mg | ORAL_TABLET | Freq: Every day | ORAL | 1 refills | Status: DC
Start: 2022-07-06 — End: 2022-10-19

## 2022-07-06 MED ORDER — ANORO ELLIPTA 62.5-25 MCG/ACT IN AEPB
1.0000 | INHALATION_SPRAY | Freq: Every day | RESPIRATORY_TRACT | 3 refills | Status: DC
Start: 2022-07-06 — End: 2022-08-22

## 2022-07-06 MED ORDER — ALPRAZOLAM 0.25 MG PO TABS
0.2500 mg | ORAL_TABLET | Freq: Two times a day (BID) | ORAL | 0 refills | Status: DC | PRN
Start: 2022-07-06 — End: 2022-07-31

## 2022-07-06 MED ORDER — SERTRALINE HCL 100 MG PO TABS
100.0000 mg | ORAL_TABLET | Freq: Every day | ORAL | 1 refills | Status: DC
Start: 2022-07-06 — End: 2022-10-23

## 2022-07-06 MED ORDER — LORATADINE 10 MG PO TABS
10.0000 mg | ORAL_TABLET | Freq: Every day | ORAL | 3 refills | Status: DC
Start: 2022-07-06 — End: 2022-08-17

## 2022-07-06 MED ORDER — MONTELUKAST SODIUM 10 MG PO TABS: 10.0000 mg | ORAL_TABLET | Freq: Every day | ORAL | 1 refills | Status: DC

## 2022-07-08 DIAGNOSIS — I10 Essential (primary) hypertension: Secondary | ICD-10-CM | POA: Diagnosis not present

## 2022-07-08 DIAGNOSIS — R079 Chest pain, unspecified: Secondary | ICD-10-CM | POA: Diagnosis not present

## 2022-07-08 DIAGNOSIS — J440 Chronic obstructive pulmonary disease with acute lower respiratory infection: Secondary | ICD-10-CM | POA: Diagnosis not present

## 2022-07-08 DIAGNOSIS — J180 Bronchopneumonia, unspecified organism: Secondary | ICD-10-CM | POA: Diagnosis not present

## 2022-07-08 DIAGNOSIS — J449 Chronic obstructive pulmonary disease, unspecified: Secondary | ICD-10-CM | POA: Diagnosis not present

## 2022-07-08 DIAGNOSIS — G43909 Migraine, unspecified, not intractable, without status migrainosus: Secondary | ICD-10-CM | POA: Diagnosis not present

## 2022-07-08 DIAGNOSIS — E7800 Pure hypercholesterolemia, unspecified: Secondary | ICD-10-CM | POA: Diagnosis not present

## 2022-07-08 DIAGNOSIS — R000 Tachycardia, unspecified: Secondary | ICD-10-CM | POA: Diagnosis not present

## 2022-07-08 DIAGNOSIS — M1990 Unspecified osteoarthritis, unspecified site: Secondary | ICD-10-CM | POA: Diagnosis not present

## 2022-07-08 DIAGNOSIS — R0602 Shortness of breath: Secondary | ICD-10-CM | POA: Diagnosis not present

## 2022-07-08 DIAGNOSIS — E43 Unspecified severe protein-calorie malnutrition: Secondary | ICD-10-CM | POA: Diagnosis not present

## 2022-07-08 DIAGNOSIS — I2699 Other pulmonary embolism without acute cor pulmonale: Secondary | ICD-10-CM | POA: Diagnosis not present

## 2022-07-08 DIAGNOSIS — R911 Solitary pulmonary nodule: Secondary | ICD-10-CM | POA: Diagnosis not present

## 2022-07-08 DIAGNOSIS — Z681 Body mass index (BMI) 19 or less, adult: Secondary | ICD-10-CM | POA: Diagnosis not present

## 2022-07-08 DIAGNOSIS — R0902 Hypoxemia: Secondary | ICD-10-CM | POA: Diagnosis not present

## 2022-07-09 DIAGNOSIS — J180 Bronchopneumonia, unspecified organism: Secondary | ICD-10-CM | POA: Diagnosis not present

## 2022-07-09 DIAGNOSIS — R079 Chest pain, unspecified: Secondary | ICD-10-CM | POA: Diagnosis not present

## 2022-07-09 DIAGNOSIS — E43 Unspecified severe protein-calorie malnutrition: Secondary | ICD-10-CM | POA: Diagnosis not present

## 2022-07-09 DIAGNOSIS — I2699 Other pulmonary embolism without acute cor pulmonale: Secondary | ICD-10-CM | POA: Diagnosis not present

## 2022-07-10 DIAGNOSIS — J180 Bronchopneumonia, unspecified organism: Secondary | ICD-10-CM | POA: Diagnosis not present

## 2022-07-10 DIAGNOSIS — E43 Unspecified severe protein-calorie malnutrition: Secondary | ICD-10-CM | POA: Diagnosis not present

## 2022-07-10 DIAGNOSIS — I2699 Other pulmonary embolism without acute cor pulmonale: Secondary | ICD-10-CM | POA: Diagnosis not present

## 2022-07-12 ENCOUNTER — Telehealth: Payer: Self-pay

## 2022-07-12 ENCOUNTER — Ambulatory Visit (INDEPENDENT_AMBULATORY_CARE_PROVIDER_SITE_OTHER): Payer: Medicare PPO | Admitting: Family Medicine

## 2022-07-12 VITALS — BP 106/62 | HR 100 | Temp 97.3°F | Resp 20 | Ht 60.0 in | Wt 87.0 lb

## 2022-07-12 DIAGNOSIS — J410 Simple chronic bronchitis: Secondary | ICD-10-CM | POA: Diagnosis not present

## 2022-07-12 DIAGNOSIS — J180 Bronchopneumonia, unspecified organism: Secondary | ICD-10-CM

## 2022-07-12 DIAGNOSIS — R0789 Other chest pain: Secondary | ICD-10-CM | POA: Diagnosis not present

## 2022-07-12 DIAGNOSIS — E538 Deficiency of other specified B group vitamins: Secondary | ICD-10-CM | POA: Diagnosis not present

## 2022-07-12 DIAGNOSIS — I2699 Other pulmonary embolism without acute cor pulmonale: Secondary | ICD-10-CM | POA: Diagnosis not present

## 2022-07-12 DIAGNOSIS — E43 Unspecified severe protein-calorie malnutrition: Secondary | ICD-10-CM

## 2022-07-12 MED ORDER — CYANOCOBALAMIN 1000 MCG/ML IJ SOLN
INTRAMUSCULAR | 0 refills | Status: DC
Start: 2022-07-12 — End: 2022-08-17

## 2022-07-12 NOTE — Patient Instructions (Signed)
Eliquis (APIXABAN) 5 MG 2 ORAL TWICE DAILY X 1 WEEK, THEN GO TO 1 TWICE A DAY.

## 2022-07-12 NOTE — Telephone Encounter (Signed)
Dr. [name],   When and where can we schedule this patient?  Patient notified that we do not have an opening within the timeframe that she needs to be seen and will follow-up with the provider for further advisement.  Hospital: [facility] Admitted: 07/08/2022 Discharged: 07/10/2022 Follow-up within: unknown Diagnosed: Pulmonary embolism and Bronchopneumonia  Patient is not feeling well and is coughing.

## 2022-07-12 NOTE — Progress Notes (Signed)
Subjective:  Patient ID: Anne [name], female    DOB: [birth year]  Age: 77 y.o. MRN: [id number]  Chief Complaint  Patient presents with   COPD    HPI Maanasa comes in for Hospital follow-up.  She was admitted to [facility] on Jul 08, 2022 - 07/10/2022 with pulmonary embolism without acute cor pulmonale and with bronchopneumonia.  She was discharged on 07/10/2022 with prednisone, eliquis, levaquin, doxycycline, Robitussin and colace.  She feels exhausted still. Continues to have shortness of breath,right sided chest pain and cough.  Poor appetite.   Patient is here with her son. Patient has not been coming for b12 shots monthly.     03/08/2022    2:48 PM 02/14/2022   10:09 AM 10/19/2021   10:52 AM 10/05/2021    9:50 AM 07/11/2021    3:36 PM  Depression screen PHQ 2/9  Decreased Interest 1 1 1 1 2   Down, Depressed, Hopeless 1 1 1 1 2   PHQ - 2 Score 2 2 2 2 4   Altered sleeping 1 1 0 0 2  Tired, decreased energy 1 1 0 1 3  Change in appetite 0 0 3 0 3  Feeling bad or failure about yourself  0 0 0 0 2  Trouble concentrating 0 0 0 0 2  Moving slowly or fidgety/restless 0 0 0 0 2  Suicidal thoughts 0 0 0 0 0  PHQ-9 Score 4 4 5 3 18   Difficult doing work/chores Not difficult at all Not difficult at all Somewhat difficult Not difficult at all Somewhat difficult        05/17/2022   11:25 AM  Fall Risk   Falls in the past year? 0  Number falls in past yr: 0  Injury with Fall? 0  Risk for fall due to : No Fall Risks  Follow up Falls evaluation completed    Patient Care Team: Blane Ohara, MD as PCP - General (Internal Medicine) Rennis Golden Lisette Abu, MD as PCP - Cardiology (Cardiology) Glendale Chard, DO as Consulting Physician (Neurology) Marcellus Scott, MD as Referring Physician (Specialist)   Review of Systems  Constitutional:  Positive for fatigue. Negative for chills and fever.  HENT:  Negative for congestion, rhinorrhea and sore throat.   Respiratory:  Positive for cough and  shortness of breath.   Cardiovascular:  Positive for chest pain (right sided chest pain).  Gastrointestinal:  Negative for abdominal pain, constipation, diarrhea, nausea and vomiting.  Genitourinary:  Negative for dysuria and urgency.  Musculoskeletal:  Negative for back pain and myalgias.  Neurological:  Positive for headaches. Negative for dizziness, weakness and light-headedness.  Psychiatric/Behavioral:  Negative for dysphoric mood. The patient is not nervous/anxious.     Current Outpatient Medications on File Prior to Visit  Medication Sig Dispense Refill   apixaban (ELIQUIS) 5 MG TABS tablet Take 5 mg by mouth.     albuterol (PROVENTIL) (2.5 MG/3ML) 0.083% nebulizer solution Take 2.5 mg by nebulization 4 (four) times daily.     albuterol (VENTOLIN HFA) 108 (90 Base) MCG/ACT inhaler INHALE 1-2 PUFFS BY MOUTH into THE lungs every SIX hours AS NEEDED FOR WHEEZING AND/OR SHORTNESS OF BREATH 8.5 g 2   ALPRAZolam (XANAX) 0.25 MG tablet Take 1 tablet (0.25 mg total) by mouth 2 (two) times daily as needed for anxiety. 60 tablet 0   amitriptyline (ELAVIL) 75 MG tablet TAKE 1 TABLET EVERY DAY 90 tablet 0   atorvastatin (LIPITOR) 10 MG tablet Take 1 tablet (10 mg  total) by mouth daily. 90 tablet 1   co-enzyme Q-10 30 MG capsule Take 30 mg by mouth 3 (three) times daily.     famotidine (PEPCID) 20 MG tablet Take 1 tablet (20 mg total) by mouth 2 (two) times daily. 180 tablet 3   gabapentin (NEURONTIN) 300 MG capsule TAKE TWO CAPSULES BY MOUTH EVERY MORNING and TAKE TWO CAPSULES BY MOUTH AT NOON and TAKE TWO CAPSULES BY MOUTH EVERYDAY AT BEDTIME 180 capsule 2   HYDROcodone-acetaminophen (NORCO/VICODIN) 5-325 MG tablet TAKE ONE TABLET BY MOUTH daily AS NEEDED FOR SEVERE pain (migranes) 30 tablet 0   loratadine (CLARITIN) 10 MG tablet Take 1 tablet (10 mg total) by mouth daily. 90 tablet 3   megestrol (MEGACE) 20 MG tablet Take 2 tablets (40 mg total) by mouth daily. 180 tablet 0   melatonin 10 MG TABS  Take 20 mg by mouth at bedtime. 1 tablet 0   methocarbamol (ROBAXIN) 750 MG tablet Take 1 tablet (750 mg total) by mouth in the morning and at bedtime. 60 tablet 0   midodrine (PROAMATINE) 2.5 MG tablet TAKE 1 TABLET THREE TIMES DAILY WITH MEALS 270 tablet 0   mirtazapine (REMERON) 45 MG tablet TAKE 1 TABLET AT BEDTIME 90 tablet 3   montelukast (SINGULAIR) 10 MG tablet Take 1 tablet (10 mg total) by mouth daily. 90 tablet 1   omeprazole (PRILOSEC) 40 MG capsule Take 1 capsule (40 mg total) by mouth 2 (two) times daily. 180 capsule 0   [name] 60 MG TABS TAKE ONE TABLET BY MOUTH ONCE DAILY 30 tablet 2   rizatriptan (MAXALT-MLT) 10 MG disintegrating tablet Take 1 tablet at onset of headache. May repeat 1 dose in 2 hours if needed. Max 2 tabs in 24 hours. 10 tablet 2   sertraline (ZOLOFT) 100 MG tablet Take 1 tablet (100 mg total) by mouth daily. 90 tablet 1   triamcinolone (KENALOG) 0.025 % cream Apply 1 Application topically 2 (two) times daily. 30 g 0   umeclidinium-vilanterol (ANORO ELLIPTA) 62.5-25 MCG/ACT AEPB Inhale 1 puff into the lungs daily. 60 each 3   Vitamin D, Ergocalciferol, (DRISDOL) 1.25 MG (50000 UNIT) CAPS capsule TAKE 1 CAPSULE EVERY 7 DAYS. 12 capsule 1   No current facility-administered medications on file prior to visit.   Past Medical History:  Diagnosis Date   Acquired absence of both breasts and nipples 06/08/2015   Acute infection of nasal sinus 05/04/2021   Aortic atherosclerosis 01/30/2021   Asthma    daily inhalers   Ataxia 10/18/2020   Bilateral carpal tunnel syndrome 01/30/2021   Bipolar disorder, mixed 06/16/2021   Breast cancer of upper-inner quadrant of left female breast 04/15/2015   Carotid artery disease 05/04/2016   Mild, 1-39% bilaterally by doppler in March 2018   Chronic pain in right shoulder 12/25/2020   COPD with acute exacerbation 07/03/2016   Dental crowns present    Drug induced constipation 04/10/2021   EIC (epidermal inclusion cyst)  03/21/2021   Family history of breast cancer in female 04/26/2015   Dx. Niece at 41; dx. Maternal aunt in her late 56s  Formatting of this note might be different from the original. Overview:  Dx. Niece at 37; dx. Maternal aunt in her late 68s   Family history of colon cancer 04/26/2015   Dx. In maternal grandfather in his late 68s-70  Formatting of this note might be different from the original. Overview:  Dx. In maternal grandfather in his late 61s-70   Frequent  falls 10/18/2020   Genetic testing 06/02/2015   Negative for pathogenic mutations within any of 20 genes on the Breast/Ovarian Cancer Panel through [employer].  No variants of uncertain significance (VUSes) were found.  The Breast/Ovarian Cancer Panel offered by [facility] Basilio Cairo, MD) includes sequencing and deletion/duplication analysis for the following 19 genes:  ATM, BARD1, BRCA1, BRCA2, BRIP1, CDH1, CHEK2, FANCC, M   History of basal cell carcinoma 04/26/2015   History of concussion 01/01/2014   History of kidney stones    History of TIA (transient ischemic attack) 10/2014   Hyperlipidemia    Insomnia    Lumbar back pain 10/18/2020   Major depressive disorder 10/18/2020   Malrotation of intestine 09/23/2017   Migraine aura without headache 02/08/2014   Migraine headache 01/30/2021   Nasal congestion 05/26/2015   will finish z-pak 05/27/2015   Nonproductive cough 05/26/2015   PONV (postoperative nausea and vomiting)    "long time ago"  none recently   Psychogenic syncope 04/23/2019   Right lumbar radiculopathy 04/23/2019   RLS (restless legs syndrome)    Simple chronic bronchitis 07/04/2019   Tobacco use    Tremor 10/18/2020   Vitamin D deficiency    Wears dentures    Weight loss 08/28/2019   Past Surgical History:  Procedure Laterality Date   ABDOMINAL HYSTERECTOMY     APPENDECTOMY  2000   BREAST BIOPSY Left    BREAST RECONSTRUCTION WITH PLACEMENT OF TISSUE EXPANDER AND FLEX HD (ACELLULAR  HYDRATED DERMIS) Bilateral 06/02/2015   Procedure: BILATERAL BREAST RECONSTRUCTION WITH PLACEMENT OF TISSUE EXPANDER AND  ACELLULAR HYDRATED DERMIS;  Surgeon: Glenna Fellows, MD;  Location: [facility];  Service: Plastics;  Laterality: Bilateral;   CARPAL TUNNEL RELEASE Bilateral    CATARACT EXTRACTION Bilateral 09/2013   KNEE ARTHROSCOPY Left    LAPAROSCOPIC ABDOMINAL EXPLORATION N/A 10/2017   LAPAROSCOPIC LYSIS OF CONGENITAL ADHESIVE BAND/LADD'S BANDS WIH INTRAOPERATIVE CHOLANGIOGRAPHY. Perforation of small intestion requiring repair.    LOOP RECORDER INSERTION N/A 05/11/2016   Procedure: Loop Recorder Insertion;  Surgeon: Will Jorja Loa, MD;  Location: [facility];  Service: Cardiovascular;  Laterality: N/A;   MASTECTOMY W/ SENTINEL NODE BIOPSY Bilateral 06/02/2015   Procedure: BILATERAL MASTECTOMY WITH LEFT SENTINEL LYMPH NODE BIOPSY;  Surgeon: Almond Lint, MD;  Location: [facility];  Service: General;  Laterality: Bilateral;   NASAL SEPTUM SURGERY     x 3   PLANTAR FASCIA SURGERY Left    REMOVAL OF BILATERAL TISSUE EXPANDERS WITH PLACEMENT OF BILATERAL BREAST IMPLANTS Bilateral 06/17/2015   Procedure: DEBRIDEMENT OF BILATERAL MASTECTOMY FLAP WITH BILATERAL TISSUE EXPANDER EXCHANGE ;  Surgeon: Glenna Fellows, MD;  Location: [facility];  Service: Plastics;  Laterality: Bilateral;   REMOVAL OF TISSUE EXPANDER Bilateral 07/12/2015   Procedure: REMOVAL OF BILATERAL TISSUE EXPANDERS;  Surgeon: Glenna Fellows, MD;  Location:  SURGERY CENTER;  Service: Plastics;  Laterality: Bilateral;    Family History  Problem Relation Age of Onset   Cervical cancer Mother 65   Colon cancer Maternal Grandfather        mets to stomach; dx. 67-70   Heart disease Father    Prostate cancer Father 76   Cervical cancer Maternal Grandmother        dx. 47s; treated with radium implant   Lung cancer Sister 50       maternal half-sister dx. lung cancer, stage III    Breast cancer Maternal Aunt    Cervical cancer Sister 20  paternal half-sister; s/p TAH   Spina bifida Grandchild    Brain cancer Grandchild        grandson dx. at 20 mos, [facility] at [facility]   Breast cancer Other 51       maternal half-sister's daughter   Cancer Maternal Aunt        d. 66; unspecified type - "began at back area and moved to vital organs"   Cancer Maternal Uncle         late 2s; unspecified type; "started at back and moved to vital organs"   Social History   Socioeconomic History   Marital status: Widowed    Spouse name: Not on file   Number of children: 1   Years of education: 66   Highest education level: Not on file  Occupational History    Comment: [employer], retired  Tobacco Use   Smoking status: Every Day    Packs/day: .25    Types: Cigarettes   Smokeless tobacco: Never   Tobacco comments:    Currently wearing nicotine patches  Vaping Use   Vaping Use: Never used  Substance and Sexual Activity   Alcohol use: Never   Drug use: No   Sexual activity: Not Currently  Other Topics Concern   Not on file  Social History Narrative   Lives alone, widow   Caffeine use- tea, 1 cup daily   Right Handed    Lives in a one story [place] home    Social Determinants of Health   Financial Resource Strain: Low Risk  (02/17/2021)   Overall Financial Resource Strain (CARDIA)    Difficulty of Paying Living Expenses: Not hard at all  Food Insecurity: Food Insecurity Present (03/08/2022)   Hunger Vital Sign    Worried About Running Out of Food in the Last Year: Never true    Ran Out of Food in the Last Year: Sometimes true  Transportation Needs: No Transportation Needs (03/08/2022)   PRAPARE - Administrator, Civil Service (Medical): No    Lack of Transportation (Non-Medical): No  Physical Activity: Inactive (03/08/2022)   Exercise Vital Sign    Days of Exercise per Week: 0 days    Minutes of Exercise per Session: 0 min  Stress: Stress  Concern Present (03/08/2022)   [employer] of Occupational Health - Occupational Stress Questionnaire    Feeling of Stress : To some extent  Social Connections: Socially Isolated (05/17/2022)   Social Connection and Isolation Panel [NHANES]    Frequency of Communication with Friends and Family: Twice a week    Frequency of Social Gatherings with Friends and Family: Twice a week    Attends Religious Services: Never    Diplomatic Services operational officer: No    Attends Engineer, structural: Never    Marital Status: Divorced    Objective:  BP 106/62   Pulse 100   Temp (!) 97.3 F (36.3 C)   Resp 20   Ht 5' (1.524 m)   Wt 87 lb (39.5 kg)   SpO2 96%   BMI 16.99 kg/m      07/12/2022    3:18 PM 06/19/2022    3:48 PM 06/08/2022    8:09 AM  BP/Weight  Systolic BP 106 114 90  Diastolic BP 62 62 50  Wt. (Lbs) 87 91 94  BMI 16.99 kg/m2 17.77 kg/m2 18.36 kg/m2    Physical Exam  Diabetic Foot Exam - Simple   No data filed  Lab Results  Component Value Date   WBC 12.8 (H) 06/08/2022   HGB 12.4 06/08/2022   HCT 37.5 06/08/2022   PLT 246 06/08/2022   GLUCOSE 80 06/08/2022   CHOL 181 05/25/2022   TRIG 100 05/25/2022   HDL 53 05/25/2022   LDLCALC 110 (H) 05/25/2022   ALT 12 06/08/2022   AST 14 06/08/2022   NA 141 06/08/2022   K 4.8 06/08/2022   CL 106 06/08/2022   CREATININE 0.78 06/08/2022   BUN 28 (H) 06/08/2022   CO2 23 06/08/2022   TSH 1.700 02/20/2022      Assessment & Plan:    Pulmonary embolism and infarction [facility]) Assessment & Plan: Patient to complete eliquis 5 mg 2 oral twice daily x 1 week, then decrease to 1 oral twice daily    Vitamin B12 deficiency Assessment & Plan: . Will give b12 shot today and have her return weekly x 3 and then return to monthly.    Orders: -     Cyanocobalamin; INJECT 1 ML INTRAMUSCULARLY  ONCE EVERY WEEK  Dispense: 12 mL; Refill: 0  Simple chronic bronchitis (HCC) Assessment & Plan: Continue  inhalers.  Complete prednisone taper.    Severe protein-calorie malnutrition Lily Kocher: less than 60% of standard weight) (HCC) Assessment & Plan: The current medical regimen is effective;  continue present plan and medications. Recommend three meals per day  Orders: -     CBC with Differential/Platelet; Future -     Comprehensive metabolic panel; Future  Other chest pain Assessment & Plan: Secondary to PE.   Orders: -     EKG 12-Lead  Bronchopneumonia Assessment & Plan: Complete levaquin.      Meds ordered this encounter  Medications   cyanocobalamin (VITAMIN B12) 1000 MCG/ML injection    Sig: INJECT 1 ML INTRAMUSCULARLY  ONCE EVERY WEEK    Dispense:  12 mL    Refill:  0    Orders Placed This Encounter  Procedures   CBC with Differential/Platelet   Comprehensive metabolic panel   EKG 12-Lead     Follow-up: Return in about 6 weeks (around 08/23/2022) for chronic follow up, LABS AND B12 SHOT ON FRIDAY (TOMORROW.) .   I,Carolyn M Morrison,acting as a Neurosurgeon for Blane Ohara, MD.,have documented all relevant documentation on the behalf of Blane Ohara, MD,as directed by  Blane Ohara, MD while in the presence of Blane Ohara, MD.   An After Visit Summary was printed and given to the patient.  Blane Ohara, MD [facility] [phone]

## 2022-07-12 NOTE — Telephone Encounter (Signed)
Patient is coming in today to be seen.

## 2022-07-13 DIAGNOSIS — J180 Bronchopneumonia, unspecified organism: Secondary | ICD-10-CM | POA: Diagnosis not present

## 2022-07-13 DIAGNOSIS — E119 Type 2 diabetes mellitus without complications: Secondary | ICD-10-CM | POA: Diagnosis not present

## 2022-07-13 DIAGNOSIS — G43909 Migraine, unspecified, not intractable, without status migrainosus: Secondary | ICD-10-CM | POA: Diagnosis not present

## 2022-07-13 DIAGNOSIS — E7800 Pure hypercholesterolemia, unspecified: Secondary | ICD-10-CM | POA: Diagnosis not present

## 2022-07-13 DIAGNOSIS — E43 Unspecified severe protein-calorie malnutrition: Secondary | ICD-10-CM | POA: Diagnosis not present

## 2022-07-13 DIAGNOSIS — I10 Essential (primary) hypertension: Secondary | ICD-10-CM | POA: Diagnosis not present

## 2022-07-13 DIAGNOSIS — M1990 Unspecified osteoarthritis, unspecified site: Secondary | ICD-10-CM | POA: Diagnosis not present

## 2022-07-13 DIAGNOSIS — J4489 Other specified chronic obstructive pulmonary disease: Secondary | ICD-10-CM | POA: Diagnosis not present

## 2022-07-13 DIAGNOSIS — I2699 Other pulmonary embolism without acute cor pulmonale: Secondary | ICD-10-CM | POA: Diagnosis not present

## 2022-07-13 NOTE — Progress Notes (Signed)
Patient in office for a visit with Dr [name] and recently discharged from the hospital. Patient is needing assistance with Eliquis and also had questions about recent pharmacy bill. Printed out patient assistance application for Eliquis, patient signed, provider signed, awaiting patients income documentation.   Also reached out to [facility] regarding last bill, they informed me that the bill was an outstanding balance from a few months ago, balance was for Bennie Pierini it was $232.63 due to her going into the donut hole and the were unable to get in contact with patient prior to her delivery.   Patient aware of balance but would like to change pharmacies, Dr Cox sent her medications to [facility] pharmacy in [place], [place].      Billee Cashing, CMA Clinical Pharmacist Assistant [phone]

## 2022-07-16 ENCOUNTER — Ambulatory Visit (INDEPENDENT_AMBULATORY_CARE_PROVIDER_SITE_OTHER): Payer: Medicare HMO

## 2022-07-16 DIAGNOSIS — E538 Deficiency of other specified B group vitamins: Secondary | ICD-10-CM | POA: Diagnosis not present

## 2022-07-16 MED ORDER — CYANOCOBALAMIN 1000 MCG/ML IJ SOLN
1000.0000 ug | Freq: Once | INTRAMUSCULAR | Status: AC
Start: 2022-07-16 — End: 2022-07-16
  Administered 2022-07-16: 1000 ug via INTRAMUSCULAR

## 2022-07-16 MED ORDER — "LUER LOCK SAFETY SYRINGES 25G X 1"" 3 ML MISC": 1.0000 | 0 refills | Status: DC

## 2022-07-16 NOTE — Progress Notes (Signed)
Patient tolerate vitamin B12 shot well. Patient's son will give her vitamin b12 shots at home.  Syringes were sent to pharmacy.

## 2022-07-18 ENCOUNTER — Telehealth: Payer: Self-pay

## 2022-07-18 DIAGNOSIS — R1030 Lower abdominal pain, unspecified: Secondary | ICD-10-CM | POA: Diagnosis not present

## 2022-07-18 DIAGNOSIS — K5900 Constipation, unspecified: Secondary | ICD-10-CM | POA: Diagnosis not present

## 2022-07-18 DIAGNOSIS — J449 Chronic obstructive pulmonary disease, unspecified: Secondary | ICD-10-CM | POA: Diagnosis not present

## 2022-07-18 DIAGNOSIS — R109 Unspecified abdominal pain: Secondary | ICD-10-CM | POA: Diagnosis not present

## 2022-07-18 DIAGNOSIS — J441 Chronic obstructive pulmonary disease with (acute) exacerbation: Secondary | ICD-10-CM | POA: Diagnosis not present

## 2022-07-18 DIAGNOSIS — R079 Chest pain, unspecified: Secondary | ICD-10-CM | POA: Diagnosis not present

## 2022-07-18 NOTE — Telephone Encounter (Signed)
Patient called stating that she was having a hard time breathing and was having stabbing pains in her left side when she would try to breathe in. Consulted with Dr. [name] and per provider patient needs to go on to the ER. Informed the patient of recommendation and patient understood verbally and stated that she would call her son and have him take her to the ER.

## 2022-07-19 ENCOUNTER — Telehealth: Payer: Self-pay | Admitting: Family Medicine

## 2022-07-19 NOTE — Telephone Encounter (Signed)
[facility] -medication orders- certification period 07/13/22 to 09/10/22

## 2022-07-22 ENCOUNTER — Encounter: Payer: Self-pay | Admitting: Family Medicine

## 2022-07-22 DIAGNOSIS — R0789 Other chest pain: Secondary | ICD-10-CM | POA: Insufficient documentation

## 2022-07-22 DIAGNOSIS — J180 Bronchopneumonia, unspecified organism: Secondary | ICD-10-CM | POA: Insufficient documentation

## 2022-07-22 DIAGNOSIS — I2699 Other pulmonary embolism without acute cor pulmonale: Secondary | ICD-10-CM | POA: Insufficient documentation

## 2022-07-22 HISTORY — DX: Bronchopneumonia, unspecified organism: J18.0

## 2022-07-22 NOTE — Assessment & Plan Note (Signed)
Continue inhalers.  Complete prednisone taper.

## 2022-07-22 NOTE — Assessment & Plan Note (Signed)
Complete levaquin.  

## 2022-07-22 NOTE — Assessment & Plan Note (Signed)
Patient to complete eliquis 5 mg 2 oral twice daily x 1 week, then decrease to 1 oral twice daily

## 2022-07-22 NOTE — Assessment & Plan Note (Addendum)
.   Will give b12 shot today and have her return weekly x 3 and then return to monthly.

## 2022-07-22 NOTE — Assessment & Plan Note (Signed)
The current medical regimen is effective;  continue present plan and medications. Recommend three meals per day

## 2022-07-22 NOTE — Assessment & Plan Note (Signed)
Secondary to PE.

## 2022-07-23 ENCOUNTER — Telehealth: Payer: Self-pay | Admitting: Family Medicine

## 2022-07-23 NOTE — Telephone Encounter (Signed)
Form faxed 07/23/22 - order #[id number]

## 2022-07-24 DIAGNOSIS — S0083XA Contusion of other part of head, initial encounter: Secondary | ICD-10-CM | POA: Diagnosis not present

## 2022-07-24 DIAGNOSIS — S51011A Laceration without foreign body of right elbow, initial encounter: Secondary | ICD-10-CM | POA: Diagnosis not present

## 2022-07-24 DIAGNOSIS — F17210 Nicotine dependence, cigarettes, uncomplicated: Secondary | ICD-10-CM | POA: Diagnosis not present

## 2022-07-24 DIAGNOSIS — T148XXA Other injury of unspecified body region, initial encounter: Secondary | ICD-10-CM | POA: Diagnosis not present

## 2022-07-24 DIAGNOSIS — Z9181 History of falling: Secondary | ICD-10-CM | POA: Diagnosis not present

## 2022-07-24 DIAGNOSIS — M5030 Other cervical disc degeneration, unspecified cervical region: Secondary | ICD-10-CM | POA: Diagnosis not present

## 2022-07-24 DIAGNOSIS — W19XXXA Unspecified fall, initial encounter: Secondary | ICD-10-CM | POA: Diagnosis not present

## 2022-07-24 DIAGNOSIS — S199XXA Unspecified injury of neck, initial encounter: Secondary | ICD-10-CM | POA: Diagnosis not present

## 2022-07-24 DIAGNOSIS — Z043 Encounter for examination and observation following other accident: Secondary | ICD-10-CM | POA: Diagnosis not present

## 2022-07-24 DIAGNOSIS — R519 Headache, unspecified: Secondary | ICD-10-CM | POA: Diagnosis not present

## 2022-07-25 DIAGNOSIS — Z792 Long term (current) use of antibiotics: Secondary | ICD-10-CM | POA: Diagnosis not present

## 2022-07-25 DIAGNOSIS — J449 Chronic obstructive pulmonary disease, unspecified: Secondary | ICD-10-CM | POA: Diagnosis not present

## 2022-07-25 DIAGNOSIS — Z853 Personal history of malignant neoplasm of breast: Secondary | ICD-10-CM | POA: Diagnosis not present

## 2022-07-25 DIAGNOSIS — I10 Essential (primary) hypertension: Secondary | ICD-10-CM | POA: Diagnosis not present

## 2022-07-25 DIAGNOSIS — R0789 Other chest pain: Secondary | ICD-10-CM | POA: Diagnosis not present

## 2022-07-25 DIAGNOSIS — R000 Tachycardia, unspecified: Secondary | ICD-10-CM | POA: Diagnosis not present

## 2022-07-25 DIAGNOSIS — R059 Cough, unspecified: Secondary | ICD-10-CM | POA: Diagnosis not present

## 2022-07-25 DIAGNOSIS — D72829 Elevated white blood cell count, unspecified: Secondary | ICD-10-CM | POA: Diagnosis not present

## 2022-07-25 DIAGNOSIS — K529 Noninfective gastroenteritis and colitis, unspecified: Secondary | ICD-10-CM | POA: Diagnosis not present

## 2022-07-25 DIAGNOSIS — E7800 Pure hypercholesterolemia, unspecified: Secondary | ICD-10-CM | POA: Diagnosis not present

## 2022-07-25 DIAGNOSIS — S20219A Contusion of unspecified front wall of thorax, initial encounter: Secondary | ICD-10-CM | POA: Diagnosis not present

## 2022-07-25 DIAGNOSIS — K6389 Other specified diseases of intestine: Secondary | ICD-10-CM | POA: Diagnosis not present

## 2022-07-25 DIAGNOSIS — W19XXXA Unspecified fall, initial encounter: Secondary | ICD-10-CM | POA: Diagnosis not present

## 2022-07-25 DIAGNOSIS — J180 Bronchopneumonia, unspecified organism: Secondary | ICD-10-CM | POA: Diagnosis not present

## 2022-07-25 DIAGNOSIS — Z79899 Other long term (current) drug therapy: Secondary | ICD-10-CM | POA: Diagnosis not present

## 2022-07-25 DIAGNOSIS — K219 Gastro-esophageal reflux disease without esophagitis: Secondary | ICD-10-CM | POA: Diagnosis not present

## 2022-07-25 DIAGNOSIS — G43909 Migraine, unspecified, not intractable, without status migrainosus: Secondary | ICD-10-CM | POA: Diagnosis not present

## 2022-07-25 DIAGNOSIS — Z7901 Long term (current) use of anticoagulants: Secondary | ICD-10-CM | POA: Diagnosis not present

## 2022-07-25 DIAGNOSIS — R062 Wheezing: Secondary | ICD-10-CM | POA: Diagnosis not present

## 2022-07-25 DIAGNOSIS — E43 Unspecified severe protein-calorie malnutrition: Secondary | ICD-10-CM | POA: Diagnosis not present

## 2022-07-25 DIAGNOSIS — D729 Disorder of white blood cells, unspecified: Secondary | ICD-10-CM | POA: Diagnosis not present

## 2022-07-25 DIAGNOSIS — E119 Type 2 diabetes mellitus without complications: Secondary | ICD-10-CM | POA: Diagnosis not present

## 2022-07-25 DIAGNOSIS — M1990 Unspecified osteoarthritis, unspecified site: Secondary | ICD-10-CM | POA: Diagnosis not present

## 2022-07-25 DIAGNOSIS — R079 Chest pain, unspecified: Secondary | ICD-10-CM | POA: Diagnosis not present

## 2022-07-25 DIAGNOSIS — I2699 Other pulmonary embolism without acute cor pulmonale: Secondary | ICD-10-CM | POA: Diagnosis not present

## 2022-07-25 DIAGNOSIS — J4489 Other specified chronic obstructive pulmonary disease: Secondary | ICD-10-CM | POA: Diagnosis not present

## 2022-07-26 ENCOUNTER — Telehealth: Payer: Self-pay

## 2022-07-26 ENCOUNTER — Other Ambulatory Visit: Payer: Self-pay

## 2022-07-26 DIAGNOSIS — S20219A Contusion of unspecified front wall of thorax, initial encounter: Secondary | ICD-10-CM | POA: Diagnosis not present

## 2022-07-26 DIAGNOSIS — K529 Noninfective gastroenteritis and colitis, unspecified: Secondary | ICD-10-CM | POA: Diagnosis not present

## 2022-07-26 DIAGNOSIS — M5416 Radiculopathy, lumbar region: Secondary | ICD-10-CM

## 2022-07-26 DIAGNOSIS — D729 Disorder of white blood cells, unspecified: Secondary | ICD-10-CM | POA: Diagnosis not present

## 2022-07-26 MED ORDER — HYDROCODONE-ACETAMINOPHEN 5-325 MG PO TABS
ORAL_TABLET | ORAL | 0 refills | Status: DC
Start: 2022-07-26 — End: 2022-10-04

## 2022-07-26 NOTE — Progress Notes (Signed)
Care Management & Coordination Services Pharmacy Team  Reason for Encounter: Medication coordination and delivery  Contacted patient to discuss medications and coordinate delivery from [facility]. {US HC Outreach:28874} Cycle dispensing form sent to *** for review.   Last adherence delivery date: 07-06-2022  Patient is due for next adherence delivery on:   This delivery to include: Adherence Packaging  30 Days  cetirizine 10 mg- 1 tablet at breakfast  Trelegy Gabapentin 300 mg- 2 at breakfast, 2 at lunch, 2 at bedtime Megestrol Ac 20 mg- 2 tablets before breakfast   Amitriptyline 75 mg at bedtime Trazodone 100 mg- 1 at bedtime  Omeprazole 40 mg- 1 at breakfast and 1 with evening meal Meloxicam 15 mg- 1 at breakfast Mirtazapine 45 mg- 1 at breakfast Midodrine 2.5 mg 1 at breakfast, 1 at lunch, 1 with evening meals  Albuterol inhaler Maxalt 10 mg DISSOLVE 1 TABLET ON THE TONGUE AT ONSET OF MIGRAINE, REPEAT IN 2 HOURS IF NEEDED (Vials) Hydrocodone 5-325 mg- Take 1 tablet by mouth daily as needed for severe pain (migraines) [name] 60 mg 1 at breakfast  Patient declined the following medications this month: ***  Refills requested from providers include: Hydrocodone  {Delivery date:25786}   Any concerns about your medications? {yes/no:20286}  How often do you forget or accidentally miss a dose? {Missed doses:25554}  Do you use a pillbox? {yes/no:20286}  Is patient in packaging {yes/no:20286}  If yes  What is the date on your next pill pack?  Any concerns or issues with your packaging?   Recent blood pressure readings are as follows:***  Recent blood glucose readings are as follows:***   Chart review: Recent office visits:  07-16-2022 Leal-BorjasLeia Alf I, CMA. B12 injection given.  07-12-2022 Blane Ohara, MD. Visit for Pulmonary embolism and infarction. Stop meloxicam. Start B12 injections weekly.  06-19-2022 Blane Ohara, MD. Visit for bronchitis. CT  chest ordered. Finished prednisone and trazodone. Increase zoloft 50 mg daily to 100 mg daily. Start alprazolam 0.25 mg twice daily PRN and anoro ellipta 1 puff daily  06-08-2022 Blane Ohara, MD. Visit for fatigue. Increase seroquel 50 mg at bedtime to 100 mg.  05-25-2022 Cox, Kirsten, MD. Follow up visit for HLD. START loratadine 10 mg daily and chantix. Finished amoxicillin, tessalon, zyrtec and promethazine. Reported not taking nicotine patch.    05-17-2022 Blane Ohara, MD. Negative covid test. START augmentin 875-125 mg twice daily, prednisone 50 mg daily and promethazine-codeine 5 mLs every 6 hours PRN. INCREASE quetiapine 50 mg at bedtime to 100 mg at bedtime. Kenelog and rocepin given.     Recent consult visits:  None  Hospital visits:  Medication Reconciliation was completed by comparing discharge summary, patient's EMR and Pharmacy list, and upon discussion with patient.  Admitted to the hospital on 07-24-2022 due to Fall. Discharge date was 07-24-2022. Discharged from [facility].    New?Medications Started at [facility] Discharge:?? None  Medication Changes at Hospital Discharge: None  Medications Discontinued at Hospital Discharge: None  Medications that remain the same after Hospital Discharge:??  -All other medications will remain the same.    Hospital visits:  Medication Reconciliation was completed by comparing discharge summary, patient's EMR and Pharmacy list, and upon discussion with patient.  Admitted to the hospital on 07-18-2022 due to chest. Discharge date was 07-18-2022. Discharged from [facility].    New?Medications Started at [facility] Discharge:?? Prednisone once daily Colace 100 mg daily PRN Levaquin 750 daily Doxycycline 100 mg BID  Medication Changes at [facility]  Discharge: None  Medications Discontinued at Hospital Discharge: None  Medications that remain the same after Hospital Discharge:??  -All other medications  will remain the same.   Medications: Outpatient Encounter Medications as of 07/26/2022  Medication Sig   albuterol (PROVENTIL) (2.5 MG/3ML) 0.083% nebulizer solution Take 2.5 mg by nebulization 4 (four) times daily.   albuterol (VENTOLIN HFA) 108 (90 Base) MCG/ACT inhaler INHALE 1-2 PUFFS BY MOUTH into THE lungs every SIX hours AS NEEDED FOR WHEEZING AND/OR SHORTNESS OF BREATH   ALPRAZolam (XANAX) 0.25 MG tablet Take 1 tablet (0.25 mg total) by mouth 2 (two) times daily as needed for anxiety.   amitriptyline (ELAVIL) 75 MG tablet TAKE 1 TABLET EVERY DAY   apixaban (ELIQUIS) 5 MG TABS tablet Take 5 mg by mouth.   atorvastatin (LIPITOR) 10 MG tablet Take 1 tablet (10 mg total) by mouth daily.   co-enzyme Q-10 30 MG capsule Take 30 mg by mouth 3 (three) times daily.   cyanocobalamin (VITAMIN B12) 1000 MCG/ML injection INJECT 1 ML INTRAMUSCULARLY  ONCE EVERY WEEK   famotidine (PEPCID) 20 MG tablet Take 1 tablet (20 mg total) by mouth 2 (two) times daily.   gabapentin (NEURONTIN) 300 MG capsule TAKE TWO CAPSULES BY MOUTH EVERY MORNING and TAKE TWO CAPSULES BY MOUTH AT NOON and TAKE TWO CAPSULES BY MOUTH EVERYDAY AT BEDTIME   HYDROcodone-acetaminophen (NORCO/VICODIN) 5-325 MG tablet TAKE ONE TABLET BY MOUTH daily AS NEEDED FOR SEVERE pain (migranes)   loratadine (CLARITIN) 10 MG tablet Take 1 tablet (10 mg total) by mouth daily.   megestrol (MEGACE) 20 MG tablet Take 2 tablets (40 mg total) by mouth daily.   melatonin 10 MG TABS Take 20 mg by mouth at bedtime.   methocarbamol (ROBAXIN) 750 MG tablet Take 1 tablet (750 mg total) by mouth in the morning and at bedtime.   midodrine (PROAMATINE) 2.5 MG tablet TAKE 1 TABLET THREE TIMES DAILY WITH MEALS   mirtazapine (REMERON) 45 MG tablet TAKE 1 TABLET AT BEDTIME   montelukast (SINGULAIR) 10 MG tablet Take 1 tablet (10 mg total) by mouth daily.   omeprazole (PRILOSEC) 40 MG capsule Take 1 capsule (40 mg total) by mouth 2 (two) times daily.   [name] 60 MG  TABS TAKE ONE TABLET BY MOUTH ONCE DAILY   rizatriptan (MAXALT-MLT) 10 MG disintegrating tablet Take 1 tablet at onset of headache. May repeat 1 dose in 2 hours if needed. Max 2 tabs in 24 hours.   sertraline (ZOLOFT) 100 MG tablet Take 1 tablet (100 mg total) by mouth daily.   SYRINGE-NEEDLE, DISP, 3 ML (LUER LOCK SAFETY SYRINGES) 25G X 1" 3 ML MISC Inject 1 each into the muscle every 30 (thirty) days.   triamcinolone (KENALOG) 0.025 % cream Apply 1 Application topically 2 (two) times daily.   umeclidinium-vilanterol (ANORO ELLIPTA) 62.5-25 MCG/ACT AEPB Inhale 1 puff into the lungs daily.   Vitamin D, Ergocalciferol, (DRISDOL) 1.25 MG (50000 UNIT) CAPS capsule TAKE 1 CAPSULE EVERY 7 DAYS.   No facility-administered encounter medications on file as of 07/26/2022.   BP Readings from Last 3 Encounters:  07/12/22 106/62  06/19/22 114/62  06/08/22 (!) 90/50    Pulse Readings from Last 3 Encounters:  07/12/22 100  06/19/22 (!) 103  06/08/22 96    No results found for: "HGBA1C" Lab Results  Component Value Date   CREATININE 0.78 06/08/2022   BUN 28 (H) 06/08/2022   GFRNONAA 77 03/01/2020   GFRAA 89 03/01/2020   NA 141  06/08/2022   K 4.8 06/08/2022   CALCIUM 9.4 06/08/2022   CO2 23 06/08/2022  07-26-2022: 1st attempt left VM 07-27-2022: 2nd attempt left VM  [place] CMA Clinical Pharmacist Assistant [phone]

## 2022-07-27 DIAGNOSIS — D729 Disorder of white blood cells, unspecified: Secondary | ICD-10-CM | POA: Diagnosis not present

## 2022-07-27 DIAGNOSIS — S20219A Contusion of unspecified front wall of thorax, initial encounter: Secondary | ICD-10-CM | POA: Diagnosis not present

## 2022-07-27 DIAGNOSIS — K529 Noninfective gastroenteritis and colitis, unspecified: Secondary | ICD-10-CM | POA: Diagnosis not present

## 2022-07-28 ENCOUNTER — Other Ambulatory Visit: Payer: Self-pay | Admitting: Family Medicine

## 2022-07-28 DIAGNOSIS — M5416 Radiculopathy, lumbar region: Secondary | ICD-10-CM

## 2022-07-31 ENCOUNTER — Ambulatory Visit (INDEPENDENT_AMBULATORY_CARE_PROVIDER_SITE_OTHER): Payer: Medicare HMO | Admitting: Family Medicine

## 2022-07-31 ENCOUNTER — Telehealth: Payer: Self-pay

## 2022-07-31 ENCOUNTER — Telehealth: Payer: Self-pay | Admitting: Family Medicine

## 2022-07-31 ENCOUNTER — Other Ambulatory Visit: Payer: Self-pay

## 2022-07-31 VITALS — BP 106/50 | HR 96 | Temp 97.6°F | Resp 18 | Ht 60.0 in | Wt 91.6 lb

## 2022-07-31 DIAGNOSIS — R270 Ataxia, unspecified: Secondary | ICD-10-CM

## 2022-07-31 DIAGNOSIS — R42 Dizziness and giddiness: Secondary | ICD-10-CM

## 2022-07-31 DIAGNOSIS — R4781 Slurred speech: Secondary | ICD-10-CM

## 2022-07-31 DIAGNOSIS — S0990XA Unspecified injury of head, initial encounter: Secondary | ICD-10-CM | POA: Diagnosis not present

## 2022-07-31 DIAGNOSIS — G479 Sleep disorder, unspecified: Secondary | ICD-10-CM

## 2022-07-31 DIAGNOSIS — R519 Headache, unspecified: Secondary | ICD-10-CM

## 2022-07-31 DIAGNOSIS — F332 Major depressive disorder, recurrent severe without psychotic features: Secondary | ICD-10-CM

## 2022-07-31 MED ORDER — ALPRAZOLAM 0.25 MG PO TABS
0.2500 mg | ORAL_TABLET | Freq: Two times a day (BID) | ORAL | 0 refills | Status: DC | PRN
Start: 2022-07-31 — End: 2022-08-27

## 2022-07-31 NOTE — Telephone Encounter (Signed)
Patient needs to be scheduled for a Hospital follow up, patient walked in to the office this morning stating that she feels really dehydrated and dry mouthed and is also now having memory issues. I asked and verified with the patient how many fluids she was in-taking daily and she states that she in taking 4 glasses of water daily and maybe a cup of coffee daily and she is trying to do 2 boosts daily. I told patient to try to increase her water in take in the mean time and we would find out when we could schedule patient to come in for follow up. Please advise.

## 2022-07-31 NOTE — Telephone Encounter (Signed)
PT DC FROM [facility] 5/24 FROM A FALL. PT STATES SHE HAS A BLACK EYE AND BRUISING DOWN HER FACE. PT FELL FACE FIRST. WHEN CAN WE GET A HOSPITAL FOLLOW UP APPT SCHEDULE?

## 2022-07-31 NOTE — Telephone Encounter (Signed)
Prescription Request  07/31/2022  LOV: 07/31/2022  What is the name of the medication or equipment?  ALPRAZolam (XANAX) 0.25 MG tablet   Have you contacted your pharmacy to request a refill? No   Which pharmacy would you like this sent to?    [facility], [place] - [address] [facility] [place] [address] Phone: [phone] Fax: [phone]      Patient notified that their request is being sent to the clinical staff for review and that they should receive a response within 2 business days.   Please advise at [facility] [phone]

## 2022-07-31 NOTE — Patient Instructions (Addendum)
STAT CT head w/o contrast. Pick-up augmentin at the pharmacy (sent in per [facility]) for colitis.

## 2022-07-31 NOTE — Telephone Encounter (Signed)
Patient was going straight to [facility] for labs and ct scan I thought. Dr. [name]

## 2022-07-31 NOTE — Assessment & Plan Note (Signed)
STAT CBC and CMP ordered Patient informed of results at 5 p.m. Blood count was mildly abnormal but improved from 1 week ago and protein was low, aware to increase protein in diet. Rest of labs normal.

## 2022-07-31 NOTE — Telephone Encounter (Signed)
The patient left a message on the main line yesterday stating that she was seen at that hospital and will need a follow-up. I tried to contact the patient back by phone this morning but was unable to speak with her. I left a message requesting the patient to call the office back to see about getting an appointment scheduled.

## 2022-07-31 NOTE — Telephone Encounter (Signed)
Patient seen today. Dr. [name]

## 2022-07-31 NOTE — Assessment & Plan Note (Signed)
CT of Head w/o CM ordered STAT  Patient notified of results at 5 p.m. No acute findings, no bleeding, recommended to push fluids (gatorade/power aid), take tylenol for headaches and rest.

## 2022-07-31 NOTE — Telephone Encounter (Signed)
Doug from [facility] health called to inform us that patient called and cancelled her appointment with them today because she told them it took her 1 hour to get home and she got lost and would need to cancel with them and wanted [name] to be aware that she did get lost on her way home from our office.

## 2022-07-31 NOTE — Progress Notes (Unsigned)
Subjective:  Patient ID: Anne [name], female    DOB: 10-Apr-1945  Age: 77 y.o. MRN: [id number]  No chief complaint on file.   HPI  Patient is a 77 year old white female with past medical history significant for COPD breast cancer status post bilateral mastectomy, recent pulmonary embolism for which she is currently on Xarelto, depression and anxiety, chronic back pain, and migraines who presented to the hospital on Jul 25, 2022 with right-sided chest pain.  The patient had fallen twice.  She fell st forward onto her face and chest.  Patient did wait 24 hours ago to the emergency department.  Patient had a CT scan of her chest which was baseline.  CT scan of abdomen and pelvis findings consistent with colitis.  CT head on 07/24/2022 no acute intracranial abnormality, mild cerebral atrophy and chronic microvascular ischemic changes.  CT cervical spine no acute fracture or traumatic subluxation.    Labs: WBC 20.6, hemoglobin 11.7, platelets 330.  CMP normal other than hyponatremia with a sodium of 135.  Patient also has a low albumin due to her malnourished state.  Patient was started on IV Levaquin and Flagyl for the colitis.  She was also given IV fluids.       03/08/2022    2:48 PM 02/14/2022   10:09 AM 10/19/2021   10:52 AM 10/05/2021    9:50 AM 07/11/2021    3:36 PM  Depression screen PHQ 2/9  Decreased Interest 1 1 1 1 2   Down, Depressed, Hopeless 1 1 1 1 2   PHQ - 2 Score 2 2 2 2 4   Altered sleeping 1 1 0 0 2  Tired, decreased energy 1 1 0 1 3  Change in appetite 0 0 3 0 3  Feeling bad or failure about yourself  0 0 0 0 2  Trouble concentrating 0 0 0 0 2  Moving slowly or fidgety/restless 0 0 0 0 2  Suicidal thoughts 0 0 0 0 0  PHQ-9 Score 4 4 5 3 18   Difficult doing work/chores Not difficult at all Not difficult at all Somewhat difficult Not difficult at all Somewhat difficult        05/17/2022   11:25 AM  Fall Risk   Falls in the past year? 0  Number falls in past yr: 0   Injury with Fall? 0  Risk for fall due to : No Fall Risks  Follow up Falls evaluation completed    Patient Care Team: Blane Ohara, MD as PCP - General (Internal Medicine) Rennis Golden Lisette Abu, MD as PCP - Cardiology (Cardiology) Glendale Chard, DO as Consulting Physician (Neurology) Marcellus Scott, MD as Referring Physician (Specialist)   Review of Systems  Constitutional:  Negative for chills, fatigue and fever.  HENT:  Negative for congestion, rhinorrhea and sore throat.   Respiratory:  Negative for cough and shortness of breath.   Cardiovascular:  Negative for chest pain.  Gastrointestinal:  Negative for abdominal pain, constipation, diarrhea, nausea and vomiting.  Genitourinary:  Negative for dysuria and urgency.  Musculoskeletal:  Negative for back pain and myalgias.  Neurological:  Positive for dizziness and headaches. Negative for weakness and light-headedness.  Psychiatric/Behavioral:  Positive for confusion. Negative for dysphoric mood. The patient is not nervous/anxious.     Current Outpatient Medications on File Prior to Visit  Medication Sig Dispense Refill   amoxicillin-clavulanate (AUGMENTIN) 875-125 MG tablet Take 1 tablet by mouth 2 (two) times daily.     doxycycline (VIBRA-TABS) 100 MG  tablet Take 100 mg by mouth 2 (two) times daily.     albuterol (PROVENTIL) (2.5 MG/3ML) 0.083% nebulizer solution Take 2.5 mg by nebulization 4 (four) times daily.     albuterol (VENTOLIN HFA) 108 (90 Base) MCG/ACT inhaler INHALE 1-2 PUFFS BY MOUTH into THE lungs every SIX hours AS NEEDED FOR WHEEZING AND/OR SHORTNESS OF BREATH 8.5 g 2   ALPRAZolam (XANAX) 0.25 MG tablet Take 1 tablet (0.25 mg total) by mouth 2 (two) times daily as needed for anxiety. 60 tablet 0   amitriptyline (ELAVIL) 75 MG tablet TAKE 1 TABLET EVERY DAY 90 tablet 0   apixaban (ELIQUIS) 5 MG TABS tablet Take 5 mg by mouth.     atorvastatin (LIPITOR) 10 MG tablet Take 1 tablet (10 mg total) by mouth daily. 90 tablet 1    co-enzyme Q-10 30 MG capsule Take 30 mg by mouth 3 (three) times daily.     cyanocobalamin (VITAMIN B12) 1000 MCG/ML injection INJECT 1 ML INTRAMUSCULARLY  ONCE EVERY WEEK 12 mL 0   famotidine (PEPCID) 20 MG tablet Take 1 tablet (20 mg total) by mouth 2 (two) times daily. 180 tablet 3   gabapentin (NEURONTIN) 300 MG capsule TAKE TWO CAPSULES BY MOUTH EVERY MORNING and TAKE TWO CAPSULES BY MOUTH AT NOON and TAKE TWO CAPSULES BY MOUTH EVERYDAY AT BEDTIME 180 capsule 2   HYDROcodone-acetaminophen (NORCO/VICODIN) 5-325 MG tablet TAKE ONE TABLET BY MOUTH daily AS NEEDED FOR SEVERE pain (migranes) 30 tablet 0   loratadine (CLARITIN) 10 MG tablet Take 1 tablet (10 mg total) by mouth daily. 90 tablet 3   megestrol (MEGACE) 20 MG tablet Take 2 tablets (40 mg total) by mouth daily. 180 tablet 0   melatonin 10 MG TABS Take 20 mg by mouth at bedtime. 1 tablet 0   methocarbamol (ROBAXIN) 750 MG tablet TAKE 1 TABLET IN THE MORNING AND AT BEDTIME 60 tablet 11   midodrine (PROAMATINE) 2.5 MG tablet TAKE 1 TABLET THREE TIMES DAILY WITH MEALS 270 tablet 0   mirtazapine (REMERON) 45 MG tablet TAKE 1 TABLET AT BEDTIME 90 tablet 3   montelukast (SINGULAIR) 10 MG tablet Take 1 tablet (10 mg total) by mouth daily. 90 tablet 1   omeprazole (PRILOSEC) 40 MG capsule Take 1 capsule (40 mg total) by mouth 2 (two) times daily. 180 capsule 0   [name] 60 MG TABS TAKE ONE TABLET BY MOUTH ONCE DAILY 30 tablet 2   rizatriptan (MAXALT-MLT) 10 MG disintegrating tablet Take 1 tablet at onset of headache. May repeat 1 dose in 2 hours if needed. Max 2 tabs in 24 hours. 10 tablet 2   sertraline (ZOLOFT) 100 MG tablet Take 1 tablet (100 mg total) by mouth daily. 90 tablet 1   SYRINGE-NEEDLE, DISP, 3 ML (LUER LOCK SAFETY SYRINGES) 25G X 1" 3 ML MISC Inject 1 each into the muscle every 30 (thirty) days. 50 each 0   triamcinolone (KENALOG) 0.025 % cream Apply 1 Application topically 2 (two) times daily. 30 g 0   umeclidinium-vilanterol  (ANORO ELLIPTA) 62.5-25 MCG/ACT AEPB Inhale 1 puff into the lungs daily. 60 each 3   Vitamin D, Ergocalciferol, (DRISDOL) 1.25 MG (50000 UNIT) CAPS capsule TAKE 1 CAPSULE EVERY 7 DAYS. 12 capsule 1   No current facility-administered medications on file prior to visit.   Past Medical History:  Diagnosis Date   Acquired absence of both breasts and nipples 06/08/2015   Acute infection of nasal sinus 05/04/2021   Aortic atherosclerosis 01/30/2021  Asthma    daily inhalers   Ataxia 10/18/2020   Bilateral carpal tunnel syndrome 01/30/2021   Bipolar disorder, mixed 06/16/2021   Breast cancer of upper-inner quadrant of left female breast 04/15/2015   Carotid artery disease 05/04/2016   Mild, 1-39% bilaterally by doppler in March 2018   Chronic pain in right shoulder 12/25/2020   COPD with acute exacerbation 07/03/2016   Dental crowns present    Drug induced constipation 04/10/2021   EIC (epidermal inclusion cyst) 03/21/2021   Family history of breast cancer in female 04/26/2015   Dx. Niece at 67; dx. Maternal aunt in her late 56s  Formatting of this note might be different from the original. Overview:  Dx. Niece at 57; dx. Maternal aunt in her late 65s   Family history of colon cancer 04/26/2015   Dx. In maternal grandfather in his late 13s-70  Formatting of this note might be different from the original. Overview:  Dx. In maternal grandfather in his late 52s-70   Frequent falls 10/18/2020   Genetic testing 06/02/2015   Negative for pathogenic mutations within any of 20 genes on the Breast/Ovarian Cancer Panel through [employer].  No variants of uncertain significance (VUSes) were found.  The Breast/Ovarian Cancer Panel offered by [facility] Basilio Cairo, MD) includes sequencing and deletion/duplication analysis for the following 19 genes:  ATM, BARD1, BRCA1, BRCA2, BRIP1, CDH1, CHEK2, FANCC, M   History of basal cell carcinoma 04/26/2015   History of concussion 01/01/2014    History of kidney stones    History of TIA (transient ischemic attack) 10/2014   Hyperlipidemia    Insomnia    Lumbar back pain 10/18/2020   Major depressive disorder 10/18/2020   Malrotation of intestine 09/23/2017   Migraine aura without headache 02/08/2014   Migraine headache 01/30/2021   Nasal congestion 05/26/2015   will finish z-pak 05/27/2015   Nonproductive cough 05/26/2015   PONV (postoperative nausea and vomiting)    "long time ago"  none recently   Psychogenic syncope 04/23/2019   Right lumbar radiculopathy 04/23/2019   RLS (restless legs syndrome)    Simple chronic bronchitis 07/04/2019   Tobacco use    Tremor 10/18/2020   Vitamin D deficiency    Wears dentures    Weight loss 08/28/2019   Past Surgical History:  Procedure Laterality Date   ABDOMINAL HYSTERECTOMY     APPENDECTOMY  2000   BREAST BIOPSY Left    BREAST RECONSTRUCTION WITH PLACEMENT OF TISSUE EXPANDER AND FLEX HD (ACELLULAR HYDRATED DERMIS) Bilateral 06/02/2015   Procedure: BILATERAL BREAST RECONSTRUCTION WITH PLACEMENT OF TISSUE EXPANDER AND  ACELLULAR HYDRATED DERMIS;  Surgeon: Glenna Fellows, MD;  Location: [facility];  Service: Plastics;  Laterality: Bilateral;   CARPAL TUNNEL RELEASE Bilateral    CATARACT EXTRACTION Bilateral 09/2013   KNEE ARTHROSCOPY Left    LAPAROSCOPIC ABDOMINAL EXPLORATION N/A 10/2017   LAPAROSCOPIC LYSIS OF CONGENITAL ADHESIVE BAND/LADD'S BANDS WIH INTRAOPERATIVE CHOLANGIOGRAPHY. Perforation of small intestion requiring repair.    LOOP RECORDER INSERTION N/A 05/11/2016   Procedure: Loop Recorder Insertion;  Surgeon: Will Jorja Loa, MD;  Location: [facility];  Service: Cardiovascular;  Laterality: N/A;   MASTECTOMY W/ SENTINEL NODE BIOPSY Bilateral 06/02/2015   Procedure: BILATERAL MASTECTOMY WITH LEFT SENTINEL LYMPH NODE BIOPSY;  Surgeon: Almond Lint, MD;  Location:  SURGERY CENTER;  Service: General;  Laterality: Bilateral;   NASAL  SEPTUM SURGERY     x 3   PLANTAR FASCIA SURGERY Left  REMOVAL OF BILATERAL TISSUE EXPANDERS WITH PLACEMENT OF BILATERAL BREAST IMPLANTS Bilateral 06/17/2015   Procedure: DEBRIDEMENT OF BILATERAL MASTECTOMY FLAP WITH BILATERAL TISSUE EXPANDER EXCHANGE ;  Surgeon: Glenna Fellows, MD;  Location: [facility];  Service: Plastics;  Laterality: Bilateral;   REMOVAL OF TISSUE EXPANDER Bilateral 07/12/2015   Procedure: REMOVAL OF BILATERAL TISSUE EXPANDERS;  Surgeon: Glenna Fellows, MD;  Location: [facility];  Service: Plastics;  Laterality: Bilateral;    Family History  Problem Relation Age of Onset   Cervical cancer Mother 57   Colon cancer Maternal Grandfather        mets to stomach; dx. 67-70   Heart disease Father    Prostate cancer Father 5   Cervical cancer Maternal Grandmother        dx. 55s; treated with radium implant   Lung cancer Sister 4       maternal half-sister dx. lung cancer, stage III   Breast cancer Maternal Aunt    Cervical cancer Sister 67       paternal half-sister; s/p TAH   Spina bifida Grandchild    Brain cancer Grandchild        grandson dx. at 20 mos, treated at [facility]   Breast cancer Other 51       maternal half-sister's daughter   Cancer Maternal Aunt        d. 99; unspecified type - "began at back area and moved to vital organs"   Cancer Maternal Uncle         late 79s; unspecified type; "started at back and moved to vital organs"   Social History   Socioeconomic History   Marital status: Widowed    Spouse name: Not on file   Number of children: 1   Years of education: 60   Highest education level: Not on file  Occupational History    Comment: [employer], retired  Tobacco Use   Smoking status: Every Day    Packs/day: .25    Types: Cigarettes   Smokeless tobacco: Never   Tobacco comments:    Currently wearing nicotine patches  Vaping Use   Vaping Use: Never used  Substance and Sexual Activity   Alcohol use: Never   Drug  use: No   Sexual activity: Not Currently  Other Topics Concern   Not on file  Social History Narrative   Lives alone, widow   Caffeine use- tea, 1 cup daily   Right Handed    Lives in a one story [place] home    Social Determinants of Health   Financial Resource Strain: Low Risk  (02/17/2021)   Overall Financial Resource Strain (CARDIA)    Difficulty of Paying Living Expenses: Not hard at all  Food Insecurity: Food Insecurity Present (03/08/2022)   Hunger Vital Sign    Worried About Running Out of Food in the Last Year: Never true    Ran Out of Food in the Last Year: Sometimes true  Transportation Needs: No Transportation Needs (03/08/2022)   PRAPARE - Administrator, Civil Service (Medical): No    Lack of Transportation (Non-Medical): No  Physical Activity: Inactive (03/08/2022)   Exercise Vital Sign    Days of Exercise per Week: 0 days    Minutes of Exercise per Session: 0 min  Stress: Stress Concern Present (03/08/2022)   [employer] of Occupational Health - Occupational Stress Questionnaire    Feeling of Stress : To some extent  Social Connections: Socially Isolated (05/17/2022)  Social Advertising account executive [NHANES]    Frequency of Communication with Friends and Family: Twice a week    Frequency of Social Gatherings with Friends and Family: Twice a week    Attends Religious Services: Never    Diplomatic Services operational officer: No    Attends Engineer, structural: Never    Marital Status: Divorced    Objective:  BP (!) 106/50   Pulse 96   Temp 97.6 F (36.4 C)   Resp 18   Ht 5' (1.524 m)   Wt 91 lb 9.6 oz (41.5 kg)   BMI 17.89 kg/m      07/31/2022   11:26 AM 07/12/2022    3:18 PM 06/19/2022    3:48 PM  BP/Weight  Systolic BP 106 106 114  Diastolic BP 50 62 62  Wt. (Lbs) 91.6 87 91  BMI 17.89 kg/m2 16.99 kg/m2 17.77 kg/m2    Physical Exam Vitals reviewed.  Constitutional:      Appearance: Normal appearance. She is normal  weight.  Neck:     Vascular: No carotid bruit.  Cardiovascular:     Rate and Rhythm: Normal rate and regular rhythm.     Heart sounds: Normal heart sounds.  Pulmonary:     Effort: Pulmonary effort is normal. No respiratory distress.     Breath sounds: Normal breath sounds.  Abdominal:     General: Abdomen is flat. Bowel sounds are normal.     Palpations: Abdomen is soft.     Tenderness: There is no abdominal tenderness.  Neurological:     Mental Status: She is alert and oriented to person, place, and time.  Psychiatric:        Mood and Affect: Mood normal.        Behavior: Behavior normal.     Diabetic Foot Exam - Simple   No data filed      Lab Results  Component Value Date   WBC 12.8 (H) 06/08/2022   HGB 12.4 06/08/2022   HCT 37.5 06/08/2022   PLT 246 06/08/2022   GLUCOSE 80 06/08/2022   CHOL 181 05/25/2022   TRIG 100 05/25/2022   HDL 53 05/25/2022   LDLCALC 110 (H) 05/25/2022   ALT 12 06/08/2022   AST 14 06/08/2022   NA 141 06/08/2022   K 4.8 06/08/2022   CL 106 06/08/2022   CREATININE 0.78 06/08/2022   BUN 28 (H) 06/08/2022   CO2 23 06/08/2022   TSH 1.700 02/20/2022      Assessment & Plan:    Ataxia  Dizziness -     CT HEAD WO CONTRAST ( ); Future  Acute nonintractable headache, unspecified headache type  Slurred speech -     CT HEAD WO CONTRAST ( ); Future  Traumatic injury of head, initial encounter -     CT HEAD WO CONTRAST ( ); Future     No orders of the defined types were placed in this encounter.   Orders Placed This Encounter  Procedures   CT HEAD WO CONTRAST ( )     Follow-up: No follow-ups on file.   I,Kathan Kirker,acting as a Neurosurgeon for Blane Ohara, MD.,have documented all relevant documentation on the behalf of Blane Ohara, MD,as directed by  Blane Ohara, MD while in the presence of Blane Ohara, MD.   An After Visit Summary was printed and given to the patient.  Blane Ohara, MD [facility] [phone]

## 2022-08-01 ENCOUNTER — Other Ambulatory Visit: Payer: Self-pay

## 2022-08-01 DIAGNOSIS — R4781 Slurred speech: Secondary | ICD-10-CM

## 2022-08-01 DIAGNOSIS — S0990XA Unspecified injury of head, initial encounter: Secondary | ICD-10-CM

## 2022-08-01 DIAGNOSIS — E43 Unspecified severe protein-calorie malnutrition: Secondary | ICD-10-CM

## 2022-08-01 DIAGNOSIS — R42 Dizziness and giddiness: Secondary | ICD-10-CM

## 2022-08-02 ENCOUNTER — Encounter: Payer: Self-pay | Admitting: Family Medicine

## 2022-08-02 DIAGNOSIS — J4489 Other specified chronic obstructive pulmonary disease: Secondary | ICD-10-CM | POA: Diagnosis not present

## 2022-08-02 DIAGNOSIS — I2699 Other pulmonary embolism without acute cor pulmonale: Secondary | ICD-10-CM | POA: Diagnosis not present

## 2022-08-02 DIAGNOSIS — R519 Headache, unspecified: Secondary | ICD-10-CM | POA: Insufficient documentation

## 2022-08-02 DIAGNOSIS — G43909 Migraine, unspecified, not intractable, without status migrainosus: Secondary | ICD-10-CM | POA: Diagnosis not present

## 2022-08-02 DIAGNOSIS — J180 Bronchopneumonia, unspecified organism: Secondary | ICD-10-CM | POA: Diagnosis not present

## 2022-08-02 DIAGNOSIS — M1990 Unspecified osteoarthritis, unspecified site: Secondary | ICD-10-CM | POA: Diagnosis not present

## 2022-08-02 DIAGNOSIS — E119 Type 2 diabetes mellitus without complications: Secondary | ICD-10-CM | POA: Diagnosis not present

## 2022-08-02 DIAGNOSIS — E43 Unspecified severe protein-calorie malnutrition: Secondary | ICD-10-CM | POA: Diagnosis not present

## 2022-08-02 DIAGNOSIS — E7800 Pure hypercholesterolemia, unspecified: Secondary | ICD-10-CM | POA: Diagnosis not present

## 2022-08-02 DIAGNOSIS — R4781 Slurred speech: Secondary | ICD-10-CM | POA: Insufficient documentation

## 2022-08-02 DIAGNOSIS — I10 Essential (primary) hypertension: Secondary | ICD-10-CM | POA: Diagnosis not present

## 2022-08-02 NOTE — Assessment & Plan Note (Signed)
STAT CT head w/o contrast.

## 2022-08-02 NOTE — Assessment & Plan Note (Signed)
Stat ct of brain.

## 2022-08-02 NOTE — Assessment & Plan Note (Signed)
Stat ct of brain. 

## 2022-08-04 ENCOUNTER — Other Ambulatory Visit: Payer: Self-pay | Admitting: Family Medicine

## 2022-08-08 ENCOUNTER — Other Ambulatory Visit: Payer: Self-pay

## 2022-08-08 ENCOUNTER — Telehealth: Payer: Self-pay | Admitting: Family Medicine

## 2022-08-08 ENCOUNTER — Telehealth: Payer: Self-pay

## 2022-08-08 NOTE — Telephone Encounter (Signed)
[facility] order number [id number]

## 2022-08-08 NOTE — Telephone Encounter (Signed)
Anne [name] hh #[id number] add on nursing order

## 2022-08-10 DIAGNOSIS — M1990 Unspecified osteoarthritis, unspecified site: Secondary | ICD-10-CM | POA: Diagnosis not present

## 2022-08-10 DIAGNOSIS — E7800 Pure hypercholesterolemia, unspecified: Secondary | ICD-10-CM | POA: Diagnosis not present

## 2022-08-10 DIAGNOSIS — J4489 Other specified chronic obstructive pulmonary disease: Secondary | ICD-10-CM | POA: Diagnosis not present

## 2022-08-10 DIAGNOSIS — E43 Unspecified severe protein-calorie malnutrition: Secondary | ICD-10-CM | POA: Diagnosis not present

## 2022-08-10 DIAGNOSIS — E119 Type 2 diabetes mellitus without complications: Secondary | ICD-10-CM | POA: Diagnosis not present

## 2022-08-10 DIAGNOSIS — J180 Bronchopneumonia, unspecified organism: Secondary | ICD-10-CM | POA: Diagnosis not present

## 2022-08-10 DIAGNOSIS — I2699 Other pulmonary embolism without acute cor pulmonale: Secondary | ICD-10-CM | POA: Diagnosis not present

## 2022-08-10 DIAGNOSIS — G43909 Migraine, unspecified, not intractable, without status migrainosus: Secondary | ICD-10-CM | POA: Diagnosis not present

## 2022-08-10 DIAGNOSIS — I10 Essential (primary) hypertension: Secondary | ICD-10-CM | POA: Diagnosis not present

## 2022-08-14 ENCOUNTER — Telehealth: Payer: Self-pay

## 2022-08-14 DIAGNOSIS — E119 Type 2 diabetes mellitus without complications: Secondary | ICD-10-CM | POA: Diagnosis not present

## 2022-08-14 DIAGNOSIS — E43 Unspecified severe protein-calorie malnutrition: Secondary | ICD-10-CM | POA: Diagnosis not present

## 2022-08-14 DIAGNOSIS — K529 Noninfective gastroenteritis and colitis, unspecified: Secondary | ICD-10-CM | POA: Diagnosis not present

## 2022-08-14 DIAGNOSIS — D729 Disorder of white blood cells, unspecified: Secondary | ICD-10-CM | POA: Diagnosis not present

## 2022-08-14 DIAGNOSIS — J4489 Other specified chronic obstructive pulmonary disease: Secondary | ICD-10-CM | POA: Diagnosis not present

## 2022-08-14 DIAGNOSIS — I2699 Other pulmonary embolism without acute cor pulmonale: Secondary | ICD-10-CM | POA: Diagnosis not present

## 2022-08-14 DIAGNOSIS — I10 Essential (primary) hypertension: Secondary | ICD-10-CM | POA: Diagnosis not present

## 2022-08-14 DIAGNOSIS — J180 Bronchopneumonia, unspecified organism: Secondary | ICD-10-CM | POA: Diagnosis not present

## 2022-08-14 DIAGNOSIS — S20219D Contusion of unspecified front wall of thorax, subsequent encounter: Secondary | ICD-10-CM | POA: Diagnosis not present

## 2022-08-14 NOTE — Progress Notes (Cosign Needed)
Care Management & Coordination Services Pharmacy Team  Reason for Encounter: Appointment Reminder  Contacted patient to confirm telephone appointment with Artelia Laroche, PharmD on 08/16/22 at 10:00 am.  Spoke with patient on 08/14/2022   Do you have any problems getting your medications? Yes If yes what types of problems are you experiencing? Financial barriers, pt stated her Eliquis is costing $180 monthly. Pt has picked it up for 1 month but stopped it due to not being able to afford it. I have messaged the office for samples and advised we can start a PAP for her as well. Pt has been off of it for about a week.   What is your top health concern you would like to discuss at your upcoming visit? Pt is having trembling in both her hands. She has been dealing with this awhile but has gotten worse over the last month. Pt is not taking any current medications for tremors.   Have you seen any other providers since your last visit with PCP? No   Chart review:  Recent office visits:  07/31/22 Blane Ohara MD (PCP). Seen for Ataxia, Dizziness, Acute nonintractable headache, unspecified headache type, Slurred speech, Traumatic injury of head, initial encounter. No med changes or follows up on file.   Recent consult visits:  None  Hospital visits:  Medication Reconciliation was completed by comparing discharge summary, patient's EMR and Pharmacy list, and upon discussion with patient.  Admitted to the hospital on 07/25/22 due to COPD, Pulmonary Embolism. Discharge date was 07/25/22. Discharged from [facility].    New?Medications Started at [facility] Discharge:?? -started None  Medication Changes at Hospital Discharge: -Changed None  Medications Discontinued at Hospital Discharge: -Stopped None  Medications that remain the same after Hospital Discharge:??  -All other medications will remain the same.     Star Rating Drugs:  Medication:  Last Fill: Day Supply Atorvastatin    08/12/22-06/05/22  90ds  Care Gaps: Annual wellness visit in last year? Yes, 03/08/22 Colonoscopy: 04/16/14 Dexa Scan: 02/18/15 If Diabetic:NA Last eye exam / retinopathy screening: Last diabetic foot exam:   Roxana Hires, [facility] Clinical Pharmacist Assistant  [phone]

## 2022-08-17 ENCOUNTER — Other Ambulatory Visit: Payer: Self-pay | Admitting: Family Medicine

## 2022-08-17 ENCOUNTER — Other Ambulatory Visit: Payer: Medicare HMO

## 2022-08-17 NOTE — Progress Notes (Signed)
08/17/2022 Name: Anne [name] MRN: [id number] DOB: 1945/06/25  Chief Complaint  Patient presents with   Medication Assistance   Shelsey KALLYSTA LANASA is a 77 y.o. year old female who presented for a telephone visit to review medications related to chronic conditions.  Subjective:  Care Team: Primary Care Provider: Blane Ohara, MD ; Next Scheduled Visit: 09/19/22  Medication Access/Adherence -Patient reports affordability concerns with their medications: Yes - She is unable to afford Eliquis and Anoro inhaler -Patient reports access/transportation concerns to their pharmacy: No  -Patient reports adherence concerns with their medications:  Yes  Patient is not taking midodrine 2.5mg  TID or sertraline and unsure if she should be or not.  Also reports not having Vit D 50,000 units weekly on hand nor megesterol 20mg  2BID  Objective: Medications Reviewed Today     Reviewed by [name], [facility] (Pharmacist) on 08/17/22 at 1614  Med List Status: <None>   Medication Order Taking? Sig Documenting Provider Last Dose Status Informant  albuterol (PROVENTIL) (2.5 MG/3ML) 0.083% nebulizer solution [id number] Yes Take 2.5 mg by nebulization 4 (four) times daily. [provider] Taking Active   albuterol (VENTOLIN HFA) 108 (90 Base) MCG/ACT inhaler [id number] Yes INHALE 1-2 PUFFS BY MOUTH into THE lungs every SIX hours AS NEEDED FOR WHEEZING AND/OR SHORTNESS OF Jeanella Anton, Kirsten, MD Taking Active   ALPRAZolam Prudy Feeler) 0.25 MG tablet [id number] Yes Take 1 tablet (0.25 mg total) by mouth 2 (two) times daily as needed for anxiety. Cox, Kirsten, MD Taking Active   amitriptyline (ELAVIL) 75 MG tablet [id number] Yes TAKE 1 TABLET EVERY DAY Cox, Kirsten, MD Taking Active   apixaban (ELIQUIS) 5 MG TABS tablet [id number] Yes Take 5 mg by mouth. [provider] Taking Active   atorvastatin (LIPITOR) 10 MG tablet [id number] Yes Take 1 tablet (10 mg total) by mouth daily. Cox, Kirsten, MD Taking  Active   Cyanocobalamin (B-12 COMPLIANCE INJECTION) 1000 MCG/ML KIT [id number] Yes Inject 1 mL as directed every 30 (thirty) days. [provider] Taking Active   gabapentin (NEURONTIN) 300 MG capsule [id number] Yes TAKE TWO CAPSULES BY MOUTH EVERY MORNING and TAKE TWO CAPSULES BY MOUTH AT NOON and TAKE TWO CAPSULES BY MOUTH EVERYDAY AT BEDTIME Cox, Kirsten, MD Taking Active            Med Note Littie Deeds, [place] A   Fri Aug 17, 2022 11:16 AM) Taking PRN  HYDROcodone-acetaminophen (NORCO/VICODIN) 5-325 MG tablet [id number] Yes TAKE ONE TABLET BY MOUTH daily AS NEEDED FOR SEVERE pain (migranes) Cox, Kirsten, MD Taking Active   megestrol (MEGACE) 20 MG tablet [id number] Yes TAKE 2 TABLETS EVERY DAY Cox, Kirsten, MD Taking Active   melatonin 10 MG TABS [id number] Yes Take 20 mg by mouth at bedtime. Cox, Kirsten, MD Taking Active   methocarbamol (ROBAXIN) 750 MG tablet [id number] No TAKE 1 TABLET IN THE MORNING AND AT BEDTIME  Patient not taking: Reported on 08/17/2022   Blane Ohara, MD Not Taking Active   midodrine (PROAMATINE) 2.5 MG tablet [id number] No TAKE 1 TABLET THREE TIMES DAILY WITH MEALS  Patient not taking: Reported on 08/17/2022   Blane Ohara, MD Not Taking Active   mirtazapine (REMERON) 45 MG tablet [id number] Yes TAKE 1 TABLET AT BEDTIME Cox, Kirsten, MD Taking Active   montelukast (SINGULAIR) 10 MG tablet [id number] Yes TAKE 1 TABLET EVERY DAY Cox, Kirsten, MD Taking Active   omeprazole (PRILOSEC) 40 MG capsule [id number] Yes TAKE 1 CAPSULE TWICE DAILY Cox, Kirsten, MD Taking Active  [name] 60 MG TABS [id number] No TAKE ONE TABLET BY MOUTH ONCE DAILY  Patient not taking: Reported on 08/17/2022   Blane Ohara, MD Not Taking Active            Med Note Littie Deeds, [place] A   Fri Aug 17, 2022  4:14 PM) cost  rizatriptan (MAXALT-MLT) 10 MG disintegrating tablet [id number] Yes Take 1 tablet at onset of headache. May repeat 1 dose in 2 hours if needed. Max 2 tabs in 24 hours. Cox, Kirsten, MD  Taking Active   sertraline (ZOLOFT) 100 MG tablet [id number] No Take 1 tablet (100 mg total) by mouth daily.  Patient not taking: Reported on 08/17/2022   CoxFritzi Mandes, MD Not Taking Active   sertraline (ZOLOFT) 50 MG tablet [id number] No TAKE 1 TABLET EVERY DAY  Patient not taking: Reported on 08/17/2022   CoxFritzi Mandes, MD Not Taking Active   SYRINGE-NEEDLE, DISP, 3 ML (LUER LOCK SAFETY SYRINGES) 25G X 1" 3 ML MISC [id number] Yes Inject 1 each into the muscle every 30 (thirty) days. Cox, Kirsten, MD Taking Active   traZODone (DESYREL) 100 MG tablet [id number] Yes TAKE 1 TABLET AT BEDTIME AS NEEDED FOR SLEEP Cox, Kirsten, MD Taking Active   umeclidinium-vilanterol [facility] ELLIPTA) 62.5-25 MCG/ACT AEPB [id number] No Inhale 1 puff into the lungs daily.  Patient not taking: Reported on 08/17/2022   CoxFritzi Mandes, MD Not Taking Active   Vitamin D, Ergocalciferol, (DRISDOL) 1.25 MG (50000 UNIT) CAPS capsule [id number] No TAKE 1 CAPSULE EVERY 7 DAYS.  Patient not taking: Reported on 08/17/2022   CoxFritzi Mandes, MD Not Taking Active            Assessment/Plan:   Medication Access/Adherence -Recommended that patient call [facility] pharmacy to request refills on Vitamin D and megesterol, as these prescriptions were sent there and should still have refills remaining -Based on income, patient would qualify for BI PAP for Eliquis 5mg  BID.  I did inform her/son that they would need to have spend 3% of income on medication costs so far this year to be approved, and that they would need to contact the pharmacy to have them send this report.  Will initiate application process with medication assistance team. -I also recommend changing Anoro to [facility], as [employer] has PAP program she would qualify for.  I will consult with Dr. [name] to see if she agrees with plan -Consulting Dr. [name] regarding sertraline and midodrine- it appears both medication doses may have been recently increased and patient never received updated  prescriptions through pharmacy -Will also see if PCP office has any Eliquis or Xarelto samples until we can get Eliquis PAP approved since patient has no medication on hand currently  Follow Up Plan: Will consult patient regarding sample availability of Eliquis, directions in regard to sertaline and midodrine, and PAP status.  [name], PharmD, DPLA

## 2022-08-20 ENCOUNTER — Other Ambulatory Visit: Payer: Self-pay | Admitting: Family Medicine

## 2022-08-20 ENCOUNTER — Telehealth: Payer: Self-pay

## 2022-08-20 NOTE — Progress Notes (Signed)
Patient outreach to follow-up after our telephone visit last week.  Dr. [name] has samples of Eliquis that have been pulled for the patient.  These are 5mg  tablets, and based on patient's weight she is to take one-half tablet two times daily.  The office also has a Landscape architect aside for her.  PAP applications for both medications are in process  Discussed other medications with Dr. [name]; and patient should restart sertraline at 50mg  daily and midodrine at 2.5mg  TID IF SVP <90.  I will consult patient on this.  Left HIPAA compliant message on her phone for her or her son (DPR that was present on last week's call) to give me  a call back.  Will attempt to call again tomorrow if I don't hear from them first.  [name], PharmD, DPLA

## 2022-08-21 ENCOUNTER — Telehealth: Payer: Self-pay

## 2022-08-21 NOTE — Telephone Encounter (Signed)
PAP application for ELIQUIS from  [employer]) has been mailed to pt home.   I will fax PCP pages once I receive pt pages   Melanee Spry CPhT [facility] [phone]

## 2022-08-21 NOTE — Progress Notes (Signed)
Pulled eliquis samples. Due to low weight, dose is recommended at 2.5 mg twice daily.  Will give breztri samples also.  Restart zoloft at 50 mg daily.  Restart midodrine at 2.5 mg three times a day if sbp is less than 90.  I discussed with Elnita Maxwell and she will her on Wednesday 6/18.  Dr. [name]

## 2022-08-21 NOTE — Telephone Encounter (Signed)
-----   Message from [name], [facility] sent at 08/17/2022  4:35 PM EDT ----- Peri Jefferson afteroon!  Patient would qualify for [employer] PAP for Eliquis 5mg  BID based on income, and I have also made her aware of the 3% OOP requirement.  Would you all mind initiating the application process for her?  Thank you!

## 2022-08-22 ENCOUNTER — Telehealth: Payer: Self-pay

## 2022-08-22 ENCOUNTER — Ambulatory Visit (INDEPENDENT_AMBULATORY_CARE_PROVIDER_SITE_OTHER): Payer: Medicare HMO | Admitting: Family Medicine

## 2022-08-22 VITALS — BP 110/60 | HR 115 | Temp 97.3°F | Ht 61.5 in | Wt 87.0 lb

## 2022-08-22 DIAGNOSIS — K117 Disturbances of salivary secretion: Secondary | ICD-10-CM | POA: Diagnosis not present

## 2022-08-22 DIAGNOSIS — G43109 Migraine with aura, not intractable, without status migrainosus: Secondary | ICD-10-CM

## 2022-08-22 DIAGNOSIS — I700 Atherosclerosis of aorta: Secondary | ICD-10-CM

## 2022-08-22 DIAGNOSIS — F330 Major depressive disorder, recurrent, mild: Secondary | ICD-10-CM

## 2022-08-22 DIAGNOSIS — I950 Idiopathic hypotension: Secondary | ICD-10-CM | POA: Diagnosis not present

## 2022-08-22 DIAGNOSIS — J410 Simple chronic bronchitis: Secondary | ICD-10-CM

## 2022-08-22 MED ORDER — QULIPTA 60 MG PO TABS: 1.0000 | ORAL_TABLET | Freq: Every day | ORAL | 3 refills | Status: DC

## 2022-08-22 MED ORDER — AMITRIPTYLINE HCL 25 MG PO TABS
25.0000 mg | ORAL_TABLET | Freq: Every day | ORAL | 0 refills | Status: DC
Start: 2022-08-22 — End: 2022-10-09

## 2022-08-22 MED ORDER — KETOROLAC TROMETHAMINE 60 MG/2ML IM SOLN
60.0000 mg | Freq: Once | INTRAMUSCULAR | Status: AC
Start: 2022-08-22 — End: 2022-08-22
  Administered 2022-08-22: 60 mg via INTRAMUSCULAR

## 2022-08-22 NOTE — Patient Instructions (Addendum)
Restart midodrine 2.5 mg three times a day.

## 2022-08-22 NOTE — Progress Notes (Unsigned)
Acute Office Visit  Subjective:    Patient ID: Anne [name], female    DOB: [birth year], 77 y.o.   MRN: [id number]  Chief Complaint  Patient presents with   Migraine    HPI: Patient is in today for migraine which started today. Has taken rizatriptan and 2 hydrocodone which have not helped. Photophobia. Migraine is located primarily on right side.    Past Medical History:  Diagnosis Date   Acquired absence of both breasts and nipples 06/08/2015   Acute infection of nasal sinus 05/04/2021   Aortic atherosclerosis 01/30/2021   Asthma    daily inhalers   Ataxia 10/18/2020   Bilateral carpal tunnel syndrome 01/30/2021   Bipolar disorder, mixed 06/16/2021   Breast cancer of upper-inner quadrant of left female breast 04/15/2015   Carotid artery disease 05/04/2016   Mild, 1-39% bilaterally by doppler in March 2018   Chronic pain in right shoulder 12/25/2020   COPD with acute exacerbation 07/03/2016   Dental crowns present    Drug induced constipation 04/10/2021   EIC (epidermal inclusion cyst) 03/21/2021   Family history of breast cancer in female 04/26/2015   Dx. Niece at 57; dx. Maternal aunt in her late 82s  Formatting of this note might be different from the original. Overview:  Dx. Niece at 41; dx. Maternal aunt in her late 37s   Family history of colon cancer 04/26/2015   Dx. In maternal grandfather in his late 73s-70  Formatting of this note might be different from the original. Overview:  Dx. In maternal grandfather in his late 2s-70   Frequent falls 10/18/2020   Genetic testing 06/02/2015   Negative for pathogenic mutations within any of 20 genes on the Breast/Ovarian Cancer Panel through [employer].  No variants of uncertain significance (VUSes) were found.  The Breast/Ovarian Cancer Panel offered by [facility] Basilio Cairo, MD) includes sequencing and deletion/duplication analysis for the following 19 genes:  ATM, BARD1, BRCA1, BRCA2, BRIP1, CDH1,  CHEK2, FANCC, M   History of basal cell carcinoma 04/26/2015   History of concussion 01/01/2014   History of kidney stones    History of TIA (transient ischemic attack) 10/2014   Hyperlipidemia    Insomnia    Lumbar back pain 10/18/2020   Major depressive disorder 10/18/2020   Malrotation of intestine 09/23/2017   Migraine aura without headache 02/08/2014   Migraine headache 01/30/2021   Nasal congestion 05/26/2015   will finish z-pak 05/27/2015   Nonproductive cough 05/26/2015   PONV (postoperative nausea and vomiting)    "long time ago"  none recently   Psychogenic syncope 04/23/2019   Right lumbar radiculopathy 04/23/2019   RLS (restless legs syndrome)    Simple chronic bronchitis 07/04/2019   Tobacco use    Tremor 10/18/2020   Vitamin D deficiency    Wears dentures    Weight loss 08/28/2019    Past Surgical History:  Procedure Laterality Date   ABDOMINAL HYSTERECTOMY     APPENDECTOMY  2000   BREAST BIOPSY Left    BREAST RECONSTRUCTION WITH PLACEMENT OF TISSUE EXPANDER AND FLEX HD (ACELLULAR HYDRATED DERMIS) Bilateral 06/02/2015   Procedure: BILATERAL BREAST RECONSTRUCTION WITH PLACEMENT OF TISSUE EXPANDER AND  ACELLULAR HYDRATED DERMIS;  Surgeon: Glenna Fellows, MD;  Location: [facility];  Service: Plastics;  Laterality: Bilateral;   CARPAL TUNNEL RELEASE Bilateral    CATARACT EXTRACTION Bilateral 09/2013   KNEE ARTHROSCOPY Left    LAPAROSCOPIC ABDOMINAL EXPLORATION N/A 10/2017   LAPAROSCOPIC LYSIS OF CONGENITAL  ADHESIVE BAND/LADD'S BANDS WIH INTRAOPERATIVE CHOLANGIOGRAPHY. Perforation of small intestion requiring repair.    LOOP RECORDER INSERTION N/A 05/11/2016   Procedure: Loop Recorder Insertion;  Surgeon: Will Jorja Loa, MD;  Location: [facility];  Service: Cardiovascular;  Laterality: N/A;   MASTECTOMY W/ SENTINEL NODE BIOPSY Bilateral 06/02/2015   Procedure: BILATERAL MASTECTOMY WITH LEFT SENTINEL LYMPH NODE BIOPSY;  Surgeon: Almond Lint,  MD;  Location: [facility];  Service: General;  Laterality: Bilateral;   NASAL SEPTUM SURGERY     x 3   PLANTAR FASCIA SURGERY Left    REMOVAL OF BILATERAL TISSUE EXPANDERS WITH PLACEMENT OF BILATERAL BREAST IMPLANTS Bilateral 06/17/2015   Procedure: DEBRIDEMENT OF BILATERAL MASTECTOMY FLAP WITH BILATERAL TISSUE EXPANDER EXCHANGE ;  Surgeon: Glenna Fellows, MD;  Location: [facility];  Service: Plastics;  Laterality: Bilateral;   REMOVAL OF TISSUE EXPANDER Bilateral 07/12/2015   Procedure: REMOVAL OF BILATERAL TISSUE EXPANDERS;  Surgeon: Glenna Fellows, MD;  Location: [facility];  Service: Plastics;  Laterality: Bilateral;    Family History  Problem Relation Age of Onset   Cervical cancer Mother 46   Colon cancer Maternal Grandfather        mets to stomach; dx. 67-70   Heart disease Father    Prostate cancer Father 47   Cervical cancer Maternal Grandmother        dx. 65s; treated with radium implant   Lung cancer Sister 51       maternal half-sister dx. lung cancer, stage III   Breast cancer Maternal Aunt    Cervical cancer Sister 52       paternal half-sister; s/p TAH   Spina bifida Grandchild    Brain cancer Grandchild        grandson dx. at 20 mos, treated at [facility]   Breast cancer Other 51       maternal half-sister's daughter   Cancer Maternal Aunt        d. 52; unspecified type - "began at back area and moved to vital organs"   Cancer Maternal Uncle         late 25s; unspecified type; "started at back and moved to vital organs"    Social History   Socioeconomic History   Marital status: Widowed    Spouse name: Not on file   Number of children: 1   Years of education: 57   Highest education level: Not on file  Occupational History    Comment: [employer], retired  Tobacco Use   Smoking status: Every Day    Packs/day: .25    Types: Cigarettes   Smokeless tobacco: Never   Tobacco comments:    Currently wearing nicotine patches   Vaping Use   Vaping Use: Never used  Substance and Sexual Activity   Alcohol use: Never   Drug use: No   Sexual activity: Not Currently  Other Topics Concern   Not on file  Social History Narrative   Lives alone, widow   Caffeine use- tea, 1 cup daily   Right Handed    Lives in a one story [place] home    Social Determinants of Health   Financial Resource Strain: Low Risk  (02/17/2021)   Overall Financial Resource Strain (CARDIA)    Difficulty of Paying Living Expenses: Not hard at all  Food Insecurity: Food Insecurity Present (03/08/2022)   Hunger Vital Sign    Worried About Running Out of Food in the Last Year: Never true  Ran Out of Food in the Last Year: Sometimes true  Transportation Needs: No Transportation Needs (03/08/2022)   PRAPARE - Administrator, Civil Service (Medical): No    Lack of Transportation (Non-Medical): No  Physical Activity: Inactive (03/08/2022)   Exercise Vital Sign    Days of Exercise per Week: 0 days    Minutes of Exercise per Session: 0 min  Stress: Stress Concern Present (03/08/2022)   [employer] of Occupational Health - Occupational Stress Questionnaire    Feeling of Stress : To some extent  Social Connections: Socially Isolated (05/17/2022)   Social Connection and Isolation Panel [NHANES]    Frequency of Communication with Friends and Family: Twice a week    Frequency of Social Gatherings with Friends and Family: Twice a week    Attends Religious Services: Never    Database administrator or Organizations: No    Attends Banker Meetings: Never    Marital Status: Divorced  Catering manager Violence: Not At Risk (05/17/2022)   Humiliation, Afraid, Rape, and Kick questionnaire    Fear of Current or Ex-Partner: No    Emotionally Abused: No    Physically Abused: No    Sexually Abused: No    Outpatient Medications Prior to Visit  Medication Sig Dispense Refill   albuterol (PROVENTIL) (2.5 MG/3ML) 0.083% nebulizer  solution Take 2.5 mg by nebulization 4 (four) times daily.     albuterol (VENTOLIN HFA) 108 (90 Base) MCG/ACT inhaler INHALE 1-2 PUFFS BY MOUTH into THE lungs every SIX hours AS NEEDED FOR WHEEZING AND/OR SHORTNESS OF BREATH 8.5 g 2   ALPRAZolam (XANAX) 0.25 MG tablet Take 1 tablet (0.25 mg total) by mouth 2 (two) times daily as needed for anxiety. 60 tablet 0   apixaban (ELIQUIS) 5 MG TABS tablet Take 5 mg by mouth.     atorvastatin (LIPITOR) 10 MG tablet Take 1 tablet (10 mg total) by mouth daily. 90 tablet 1   Cyanocobalamin (B-12 COMPLIANCE INJECTION) 1000 MCG/ML KIT Inject 1 mL as directed every 30 (thirty) days.     gabapentin (NEURONTIN) 300 MG capsule TAKE TWO CAPSULES BY MOUTH EVERY MORNING and TAKE TWO CAPSULES BY MOUTH AT NOON and TAKE TWO CAPSULES BY MOUTH EVERYDAY AT BEDTIME 180 capsule 2   HYDROcodone-acetaminophen (NORCO/VICODIN) 5-325 MG tablet TAKE ONE TABLET BY MOUTH daily AS NEEDED FOR SEVERE pain (migranes) 30 tablet 0   megestrol (MEGACE) 20 MG tablet TAKE 2 TABLETS EVERY DAY 180 tablet 3   melatonin 10 MG TABS Take 20 mg by mouth at bedtime. 1 tablet 0   methocarbamol (ROBAXIN) 750 MG tablet TAKE 1 TABLET IN THE MORNING AND AT BEDTIME (Patient not taking: Reported on 08/17/2022) 60 tablet 11   midodrine (PROAMATINE) 2.5 MG tablet TAKE 1 TABLET THREE TIMES DAILY WITH MEALS (Patient not taking: Reported on 08/17/2022) 270 tablet 0   mirtazapine (REMERON) 45 MG tablet TAKE 1 TABLET AT BEDTIME 90 tablet 3   montelukast (SINGULAIR) 10 MG tablet TAKE 1 TABLET EVERY DAY 90 tablet 3   omeprazole (PRILOSEC) 40 MG capsule TAKE 1 CAPSULE TWICE DAILY 180 capsule 3   rizatriptan (MAXALT-MLT) 10 MG disintegrating tablet Take 1 tablet at onset of headache. May repeat 1 dose in 2 hours if needed. Max 2 tabs in 24 hours. 10 tablet 2   sertraline (ZOLOFT) 100 MG tablet Take 1 tablet (100 mg total) by mouth daily. (Patient not taking: Reported on 08/17/2022) 90 tablet 1   sertraline (ZOLOFT)  50 MG  tablet TAKE 1 TABLET EVERY DAY (Patient not taking: Reported on 08/17/2022) 90 tablet 3   SYRINGE-NEEDLE, DISP, 3 ML (LUER LOCK SAFETY SYRINGES) 25G X 1" 3 ML MISC Inject 1 each into the muscle every 30 (thirty) days. 50 each 0   traZODone (DESYREL) 100 MG tablet TAKE 1 TABLET AT BEDTIME AS NEEDED FOR SLEEP 90 tablet 3   Vitamin D, Ergocalciferol, (DRISDOL) 1.25 MG (50000 UNIT) CAPS capsule TAKE 1 CAPSULE EVERY 7 DAYS. (Patient not taking: Reported on 08/17/2022) 12 capsule 1   amitriptyline (ELAVIL) 75 MG tablet TAKE 1 TABLET EVERY DAY 90 tablet 1   [name] 60 MG TABS TAKE ONE TABLET BY MOUTH ONCE DAILY (Patient not taking: Reported on 08/17/2022) 30 tablet 2   umeclidinium-vilanterol (ANORO ELLIPTA) 62.5-25 MCG/ACT AEPB Inhale 1 puff into the lungs daily. (Patient not taking: Reported on 08/17/2022) 60 each 3   No facility-administered medications prior to visit.    Allergies  Allergen Reactions   Clindamycin/Lincomycin Nausea And Vomiting   Lyrica [Pregabalin] Other (See Comments)    PERSONALITY CHANGE    Review of Systems  Eyes:  Positive for photophobia.  Respiratory:  Negative for cough and shortness of breath.   Gastrointestinal:  Negative for diarrhea, nausea and vomiting.  Neurological:  Positive for headaches. Negative for dizziness.       Objective:        08/22/2022    3:31 PM 07/31/2022   11:26 AM 07/12/2022    3:18 PM  Vitals with BMI  Height 5' 1.5" 5\' 0"  5\' 0"   Weight 87 lbs 91 lbs 10 oz 87 lbs  BMI 16.17 17.89 16.99  Systolic 110 106 161  Diastolic 60 50 62  Pulse 115 96 100    No data found.   Physical Exam Vitals reviewed.  Constitutional:      Appearance: Normal appearance.  Neck:     Vascular: No carotid bruit.  Cardiovascular:     Rate and Rhythm: Normal rate and regular rhythm.     Pulses: Normal pulses.     Heart sounds: Normal heart sounds.  Pulmonary:     Effort: Pulmonary effort is normal.     Breath sounds: Normal breath sounds.   Abdominal:     General: Bowel sounds are normal.     Palpations: Abdomen is soft.     Tenderness: There is no abdominal tenderness.  Neurological:     Mental Status: She is alert and oriented to person, place, and time.  Psychiatric:        Mood and Affect: Mood normal.        Behavior: Behavior normal.     Health Maintenance Due  Topic Date Due   Hepatitis C Screening  Never done   COVID-19 Vaccine (3 - Moderna risk series) 11/26/2021   Zoster Vaccines- Shingrix (2 of 2) 04/02/2022   Lung Cancer Screening  05/30/2022    There are no preventive care reminders to display for this patient.   Lab Results  Component Value Date   TSH 1.700 02/20/2022   Lab Results  Component Value Date   WBC 12.8 (H) 06/08/2022   HGB 12.4 06/08/2022   HCT 37.5 06/08/2022   MCV 90 06/08/2022   PLT 246 06/08/2022   Lab Results  Component Value Date   NA 141 06/08/2022   K 4.8 06/08/2022   CO2 23 06/08/2022   GLUCOSE 80 06/08/2022   BUN 28 (H) 06/08/2022   CREATININE 0.78 06/08/2022  BILITOT 0.3 06/08/2022   ALKPHOS 93 06/08/2022   AST 14 06/08/2022   ALT 12 06/08/2022   PROT 6.0 06/08/2022   ALBUMIN 4.3 06/08/2022   CALCIUM 9.4 06/08/2022   ANIONGAP 3 (L) 07/03/2016   EGFR 79 06/08/2022   Lab Results  Component Value Date   CHOL 181 05/25/2022   Lab Results  Component Value Date   HDL 53 05/25/2022   Lab Results  Component Value Date   LDLCALC 110 (H) 05/25/2022   Lab Results  Component Value Date   TRIG 100 05/25/2022   Lab Results  Component Value Date   CHOLHDL 3.4 05/25/2022   No results found for: "HGBA1C"     Assessment & Plan:  Migraine aura without headache Assessment & Plan: Continue with [name] and Elavil Ordered Toradol 60 mg IM #1  Orders: -     Ketorolac Tromethamine  Other orders -     [name]; Take 1 tablet (60 mg total) by mouth daily.  Dispense: 90 tablet; Refill: 3 -     Amitriptyline HCl; Take 1 tablet (25 mg total) by mouth at  bedtime.  Dispense: 90 tablet; Refill: 0     Meds ordered this encounter  Medications   ketorolac (TORADOL) injection 60 mg   Atogepant ([name]) 60 MG TABS    Sig: Take 1 tablet (60 mg total) by mouth daily.    Dispense:  90 tablet    Refill:  3    This prescription was filled on 05/31/2022. Any refills authorized will be placed on file.   amitriptyline (ELAVIL) 25 MG tablet    Sig: Take 1 tablet (25 mg total) by mouth at bedtime.    Dispense:  90 tablet    Refill:  0    No orders of the defined types were placed in this encounter.    Follow-up: No follow-ups on file.  An After Visit Summary was printed and given to the patient.   Clayborn Bigness I Leal-Borjas,acting as a scribe for Blane Ohara, MD.,have documented all relevant documentation on the behalf of Blane Ohara, MD,as directed by  Blane Ohara, MD while in the presence of Blane Ohara, MD.   Blane Ohara, MD [facility] [phone]

## 2022-08-22 NOTE — Telephone Encounter (Signed)
Spoke with patient regarding eliquis and breztri samples. Patient also aware of suggestions regarding sertraline and midodrine:  From progress note on 08/20/22, "samples of Eliquis that have been pulled for the patient.  These are 5mg  tablets, and based on patient's weight she is to take one-half tablet two times daily.  The office also has a Landscape architect aside for her.  PAP applications for both medications are in process   Discussed other medications with Dr. [name]; and patient should restart sertraline at 50mg  daily and midodrine at 2.5mg  TID IF SVP <90.  I will consult patient on this."  Patient verbalized understanding and plans to pick up today.

## 2022-08-23 ENCOUNTER — Other Ambulatory Visit: Payer: Self-pay

## 2022-08-23 ENCOUNTER — Telehealth: Payer: Self-pay

## 2022-08-23 NOTE — Telephone Encounter (Signed)
PLEASE ASK MD TO SEND IN NEW PRESCRIPTION FOR BREZTRI  FOR [employer] THAT IS WHAT COMPANY WILL BE WAITING ON. PLEASE SEND THROUGH [facility]  SURE SCRIPTS TO COMPLETE APPROVE APPLICATION  PLEASE BE ADVISED

## 2022-08-23 NOTE — Progress Notes (Addendum)
   08/23/2022  Patient ID: Anne [name], female   DOB: August 05, 1945, 77 y.o.   MRN: [id number]  Received message from PCP, Dr. [name], that patient is needing assistance with the cost of [name].  She was recently seen for migraine; and upon reviewing chart notes, it was mentioned in May she could not afford this medication due to being in the donut hole.    Patient would qualify for PAP for Quilipta 60mg  daily through [place].  Will work with medication assistance team to start the application process.  I was also notified today that [name] has been approved for Breztri PAP, but a prescription needs to be sent to [facility] pharmacy for processing.  I have pended this prescription for Dr. [name] to sign.  [name], PharmD, DPLA

## 2022-08-23 NOTE — Telephone Encounter (Signed)
-----   Message from [name], [facility] sent at 08/22/2022  3:57 PM EDT ----- Good afternoon,  Ms. Woodford would also qualify for Breztri PAP through [employer], and provider agrees with plan to switch from [place] to this to pursue PAP.  Would you all mind to initiate this process, please?  Thank you!

## 2022-08-23 NOTE — Telephone Encounter (Signed)
Received notification from [employer] regarding approval for BREZTRI. Patient assistance approved from 08/23/2022 to 03/05/2023.  Phone: [phone]  Please be advised

## 2022-08-24 ENCOUNTER — Other Ambulatory Visit (HOSPITAL_COMMUNITY): Payer: Self-pay

## 2022-08-24 NOTE — Assessment & Plan Note (Signed)
Restart [name]. Samples given. Pharmaceutical team working on PAP for qlipta. Ordered Toradol 60 mg IM #1

## 2022-08-25 NOTE — Assessment & Plan Note (Signed)
Restart midodrine 2.5 mg three times a day.   

## 2022-08-26 ENCOUNTER — Encounter: Payer: Self-pay | Admitting: Family Medicine

## 2022-08-26 DIAGNOSIS — K117 Disturbances of salivary secretion: Secondary | ICD-10-CM | POA: Insufficient documentation

## 2022-08-26 NOTE — Assessment & Plan Note (Signed)
Decrease amitriptyline to 25 mg daily.

## 2022-08-26 NOTE — Assessment & Plan Note (Signed)
Change anoro to breztri. Team working on PAP.

## 2022-08-26 NOTE — Assessment & Plan Note (Signed)
Continue lipitor  ?

## 2022-08-26 NOTE — Assessment & Plan Note (Signed)
Restart zoloft 50 mg daily.

## 2022-08-26 NOTE — Assessment & Plan Note (Signed)
>>  ASSESSMENT AND PLAN FOR MILD RECURRENT MAJOR DEPRESSION (HCC) WRITTEN ON 08/26/2022  7:38 PM BY COX, KIRSTEN, MD  Restart zoloft 50 mg daily.

## 2022-08-27 ENCOUNTER — Telehealth: Payer: Self-pay

## 2022-08-27 ENCOUNTER — Other Ambulatory Visit: Payer: Self-pay

## 2022-08-27 DIAGNOSIS — F332 Major depressive disorder, recurrent severe without psychotic features: Secondary | ICD-10-CM

## 2022-08-27 DIAGNOSIS — G479 Sleep disorder, unspecified: Secondary | ICD-10-CM

## 2022-08-27 MED ORDER — BREZTRI AEROSPHERE 160-9-4.8 MCG/ACT IN AERO: 2.0000 | INHALATION_SPRAY | Freq: Two times a day (BID) | RESPIRATORY_TRACT | 3 refills | Status: DC

## 2022-08-27 MED ORDER — ALPRAZOLAM 0.25 MG PO TABS
0.2500 mg | ORAL_TABLET | Freq: Two times a day (BID) | ORAL | 0 refills | Status: DC | PRN
Start: 2022-08-27 — End: 2022-10-09

## 2022-08-27 NOTE — Telephone Encounter (Signed)
PLEASE ASK MD TO SEND IN  NEW PRESCRIPTION FOR BREZTRI   FOR [place] THAT IS WHAT COMPANY  WILL BE WAITING ON.  PLEASE SEND THROUGH  [facility]  TO COMPLETE APPROVED APPLICATION    LETTER IN MEDIA OF CHART ON HOW TO SEND NEW RX TO COMPANY

## 2022-08-27 NOTE — Telephone Encounter (Signed)
PAP application for [name] 60MG  FROM [facility]  has been mailed to pt home. I will fax PCP pages once I receive pt pages   PLEASE BE ADVISED   Melanee Spry CPhT [facility] [phone]

## 2022-08-27 NOTE — Telephone Encounter (Signed)
-----   Message from [name], [facility] sent at 08/23/2022  5:30 PM EDT ----- Good evening,  Patient would qualify for [facility] PAP for [name] 60mg  daily.  Would you all mind to initiate the application process, please?  Thank you!

## 2022-08-28 DIAGNOSIS — D729 Disorder of white blood cells, unspecified: Secondary | ICD-10-CM | POA: Diagnosis not present

## 2022-08-28 DIAGNOSIS — E119 Type 2 diabetes mellitus without complications: Secondary | ICD-10-CM | POA: Diagnosis not present

## 2022-08-28 DIAGNOSIS — J4489 Other specified chronic obstructive pulmonary disease: Secondary | ICD-10-CM | POA: Diagnosis not present

## 2022-08-28 DIAGNOSIS — I2699 Other pulmonary embolism without acute cor pulmonale: Secondary | ICD-10-CM | POA: Diagnosis not present

## 2022-08-28 DIAGNOSIS — S20219D Contusion of unspecified front wall of thorax, subsequent encounter: Secondary | ICD-10-CM | POA: Diagnosis not present

## 2022-08-28 DIAGNOSIS — K529 Noninfective gastroenteritis and colitis, unspecified: Secondary | ICD-10-CM | POA: Diagnosis not present

## 2022-08-28 DIAGNOSIS — I10 Essential (primary) hypertension: Secondary | ICD-10-CM | POA: Diagnosis not present

## 2022-08-28 DIAGNOSIS — E43 Unspecified severe protein-calorie malnutrition: Secondary | ICD-10-CM | POA: Diagnosis not present

## 2022-08-28 DIAGNOSIS — J180 Bronchopneumonia, unspecified organism: Secondary | ICD-10-CM | POA: Diagnosis not present

## 2022-08-30 ENCOUNTER — Telehealth: Payer: Self-pay

## 2022-08-30 NOTE — Progress Notes (Signed)
   08/30/2022  Patient ID: Anne [name], female   DOB: 1946/01/28, 77 y.o.   MRN: [id number]  Patient outreach attempt x2 unsuccessful, but I was able to leave a voicemail.  Went ahead and also contacted patient's son (DPR and present for our initial telephone visit) since I have not been able to reach Ms. Schlien.  Checking to see if patient received Eliquis samples from Dr. [name] office and see if she received Quilipta and Eliquis PAP applications.  Son was out of town but plans to speak with Anne [name] and call me Monday.  Anne [name], PharmD, DPLA   TT son he is going to call me back Monday re: eliquis samples picked up?  Rec'd pap apps for Quilipta and Eliqiuis?

## 2022-08-31 ENCOUNTER — Telehealth: Payer: Self-pay

## 2022-08-31 NOTE — Telephone Encounter (Signed)
Patient called and left message wanting to schedule an appointment because her tremors are getting worse - Will you take a look at your schedule for next week and see where we can get her in?

## 2022-09-01 DIAGNOSIS — G43119 Migraine with aura, intractable, without status migrainosus: Secondary | ICD-10-CM | POA: Diagnosis not present

## 2022-09-03 MED ORDER — PRIMIDONE 50 MG PO TABS: 25.0000 mg | ORAL_TABLET | Freq: Every day | ORAL | 0 refills | Status: DC

## 2022-09-03 NOTE — Telephone Encounter (Signed)
Left message to return call back. 

## 2022-09-03 NOTE — Telephone Encounter (Signed)
Patient informed and agreed to trying the medication. Sent RX to [facility] in [place]. Told the patient to keep follow up appointment.

## 2022-09-05 ENCOUNTER — Telehealth: Payer: Self-pay

## 2022-09-05 DIAGNOSIS — J4489 Other specified chronic obstructive pulmonary disease: Secondary | ICD-10-CM | POA: Diagnosis not present

## 2022-09-05 DIAGNOSIS — J180 Bronchopneumonia, unspecified organism: Secondary | ICD-10-CM | POA: Diagnosis not present

## 2022-09-05 DIAGNOSIS — E43 Unspecified severe protein-calorie malnutrition: Secondary | ICD-10-CM | POA: Diagnosis not present

## 2022-09-05 DIAGNOSIS — I2699 Other pulmonary embolism without acute cor pulmonale: Secondary | ICD-10-CM | POA: Diagnosis not present

## 2022-09-05 DIAGNOSIS — I10 Essential (primary) hypertension: Secondary | ICD-10-CM | POA: Diagnosis not present

## 2022-09-05 DIAGNOSIS — E119 Type 2 diabetes mellitus without complications: Secondary | ICD-10-CM | POA: Diagnosis not present

## 2022-09-05 DIAGNOSIS — K529 Noninfective gastroenteritis and colitis, unspecified: Secondary | ICD-10-CM | POA: Diagnosis not present

## 2022-09-05 DIAGNOSIS — D729 Disorder of white blood cells, unspecified: Secondary | ICD-10-CM | POA: Diagnosis not present

## 2022-09-05 DIAGNOSIS — S20219D Contusion of unspecified front wall of thorax, subsequent encounter: Secondary | ICD-10-CM | POA: Diagnosis not present

## 2022-09-05 NOTE — Progress Notes (Signed)
   09/05/2022  Patient ID: Anne [name], female   DOB: 1946/02/26, 77 y.o.   MRN: [id number]  Patient outreach attempt to follow up on previous telephone visit around PAP applications.  Patient was approved for [facility]; wanting to see if she has received this medication yet.  She was also mailed applications for PAP for Quilipta and Eliquis; wanting to verify if these were received.  Unable to make contact; will attempt again next week.  [name], PharmD, DPLA

## 2022-09-17 ENCOUNTER — Telehealth: Payer: Self-pay

## 2022-09-17 NOTE — Progress Notes (Signed)
   09/17/2022  Patient ID: Anne [name], female   DOB: [birth year], 77 y.o.   MRN: [id number]  Patient outreach to make sure patient has received PAP supplied Breztri and PAP application for Eliquis.  I was unable to make contact but did leave HIPAA compliant voicemail with my direct phone number and will also send MyChart message.  [name], PharmD, DPLA

## 2022-09-18 ENCOUNTER — Telehealth: Payer: Self-pay

## 2022-09-18 NOTE — Progress Notes (Signed)
   09/18/2022  Patient ID: Anne [name], female   DOB: November 01, 1945, 77 y.o.   MRN: [id number]  Anne [name] returned my call from yesterday and left a voicemail stating it is best to try to reach her on her home phone.  Attempted to call home phone x2, and was not able to reach her or leave a voicemail.  Messaged Dr. [name] to see if she will follow-up with patient about Breztri and Eliquis PAP when she sees the patient tomorrow and let me know how I can further assist.  [name], PharmD, DPLA

## 2022-09-19 ENCOUNTER — Encounter: Payer: Self-pay | Admitting: Family Medicine

## 2022-09-19 ENCOUNTER — Ambulatory Visit (INDEPENDENT_AMBULATORY_CARE_PROVIDER_SITE_OTHER): Payer: Medicare HMO | Admitting: Family Medicine

## 2022-09-19 VITALS — BP 110/60 | HR 88 | Temp 97.0°F | Resp 18 | Ht 61.0 in | Wt 84.0 lb

## 2022-09-19 DIAGNOSIS — G479 Sleep disorder, unspecified: Secondary | ICD-10-CM | POA: Diagnosis not present

## 2022-09-19 DIAGNOSIS — E782 Mixed hyperlipidemia: Secondary | ICD-10-CM

## 2022-09-19 DIAGNOSIS — G43109 Migraine with aura, not intractable, without status migrainosus: Secondary | ICD-10-CM

## 2022-09-19 DIAGNOSIS — I6521 Occlusion and stenosis of right carotid artery: Secondary | ICD-10-CM

## 2022-09-19 DIAGNOSIS — E538 Deficiency of other specified B group vitamins: Secondary | ICD-10-CM

## 2022-09-19 DIAGNOSIS — E43 Unspecified severe protein-calorie malnutrition: Secondary | ICD-10-CM

## 2022-09-19 DIAGNOSIS — I2699 Other pulmonary embolism without acute cor pulmonale: Secondary | ICD-10-CM | POA: Diagnosis not present

## 2022-09-19 DIAGNOSIS — I950 Idiopathic hypotension: Secondary | ICD-10-CM

## 2022-09-19 DIAGNOSIS — F5105 Insomnia due to other mental disorder: Secondary | ICD-10-CM | POA: Diagnosis not present

## 2022-09-19 DIAGNOSIS — R634 Abnormal weight loss: Secondary | ICD-10-CM

## 2022-09-19 DIAGNOSIS — J410 Simple chronic bronchitis: Secondary | ICD-10-CM | POA: Diagnosis not present

## 2022-09-19 NOTE — Assessment & Plan Note (Signed)
Continue ensure high protein.

## 2022-09-19 NOTE — Assessment & Plan Note (Addendum)
The current medical regimen is effective;  continue present plan and medications.  Continue  Albuterol nebulizer, albuterol inhaler, Breztri inhaler.

## 2022-09-19 NOTE — Assessment & Plan Note (Addendum)
Well controlled.  No changes to medicines.  Continue Lipitor 10 mg nightly Continue to work on eating a healthy diet and exercise.  Labs drawn today.

## 2022-09-19 NOTE — Assessment & Plan Note (Signed)
Currently taking trazodone 100 mg at bedtime. Amitriptyline 25 mg daily at bedtime.   Xanax 0.25 mg twice daily as needed.

## 2022-09-19 NOTE — Assessment & Plan Note (Addendum)
The current medical regimen is effective;  continue present plan and medications.  On Eliquis and Lipitor.

## 2022-09-19 NOTE — Assessment & Plan Note (Signed)
Taking eliquis 5 mg 1 oral twice daily

## 2022-09-19 NOTE — Progress Notes (Unsigned)
Subjective:  Patient ID: Anne [name], female    DOB: Feb 02, 1946  Age: 77 y.o. MRN: [id number]  Chief Complaint  Patient presents with   Medical Management of Chronic Issues    HPI  PE: Taking Eliquis 5 mg BID.  Anxiety: Takes alprazolam 0.25 mg BID PRN for anxiety.  Simple Bronchitis:  Albuterol nebulizer, albuterol inhaler, Breztri inhaler.  Depression: Currently taking sertraline  Insomnia: Takes trazodone 100 mg daily at bedtime, amitriptyline 25 mg daily at bedtime..  Hypertension currently on midodrine 2.5 mg 3 times daily.     09/19/2022    8:49 AM 03/08/2022    2:48 PM 02/14/2022   10:09 AM 10/19/2021   10:52 AM 10/05/2021    9:50 AM  Depression screen PHQ 2/9  Decreased Interest 1 1 1 1 1   Down, Depressed, Hopeless 1 1 1 1 1   PHQ - 2 Score 2 2 2 2 2   Altered sleeping 3 1 1  0 0  Tired, decreased energy 2 1 1  0 1  Change in appetite  0 0 3 0  Feeling bad or failure about yourself  0 0 0 0 0  Trouble concentrating 1 0 0 0 0  Moving slowly or fidgety/restless 0 0 0 0 0  Suicidal thoughts 0 0 0 0 0  PHQ-9 Score 8 4 4 5 3   Difficult doing work/chores Somewhat difficult Not difficult at all Not difficult at all Somewhat difficult Not difficult at all        09/19/2022    8:48 AM  Fall Risk   Falls in the past year? 1  Number falls in past yr: 1  Injury with Fall? 1  Risk for fall due to : History of fall(s)  Follow up Falls evaluation completed;Falls prevention discussed    Patient Care Team: Blane Ohara, MD as PCP - General (Internal Medicine) Glendale Chard, DO as Consulting Physician (Neurology) Marcellus Scott, MD as Referring Physician (Specialist)   Review of Systems  Constitutional:  Positive for appetite change and fatigue. Negative for chills and fever.  HENT:  Negative for congestion, ear pain, rhinorrhea and sore throat.   Respiratory:  Negative for cough and shortness of breath.   Cardiovascular:  Negative for chest pain and palpitations.   Gastrointestinal:  Negative for abdominal pain, constipation, diarrhea, nausea and vomiting.  Genitourinary:  Negative for dysuria and urgency.  Musculoskeletal:  Positive for arthralgias (right shoulder pain) and myalgias (cramps lower legs). Negative for back pain.  Neurological:  Positive for dizziness and headaches. Negative for weakness and light-headedness.  Psychiatric/Behavioral:  Positive for dysphoric mood and sleep disturbance. The patient is nervous/anxious.     Current Outpatient Medications on File Prior to Visit  Medication Sig Dispense Refill   albuterol (PROVENTIL) (2.5 MG/3ML) 0.083% nebulizer solution Take 2.5 mg by nebulization 4 (four) times daily.     albuterol (VENTOLIN HFA) 108 (90 Base) MCG/ACT inhaler INHALE 1-2 PUFFS BY MOUTH into THE lungs every SIX hours AS NEEDED FOR WHEEZING AND/OR SHORTNESS OF BREATH 8.5 g 2   ALPRAZolam (XANAX) 0.25 MG tablet Take 1 tablet (0.25 mg total) by mouth 2 (two) times daily as needed for anxiety. 60 tablet 0   amitriptyline (ELAVIL) 25 MG tablet Take 1 tablet (25 mg total) by mouth at bedtime. 90 tablet 0   apixaban (ELIQUIS) 5 MG TABS tablet Take 5 mg by mouth.     Atogepant ([name]) 60 MG TABS Take 1 tablet (60 mg total) by mouth daily.  90 tablet 3   atorvastatin (LIPITOR) 10 MG tablet Take 1 tablet (10 mg total) by mouth daily. 90 tablet 1   Budeson-Glycopyrrol-Formoterol (BREZTRI AEROSPHERE) 160-9-4.8 MCG/ACT AERO Inhale 2 puffs into the lungs 2 (two) times daily. 3 each 3   Cyanocobalamin (B-12 COMPLIANCE INJECTION) 1000 MCG/ML KIT Inject 1 mL as directed every 30 (thirty) days.     gabapentin (NEURONTIN) 300 MG capsule TAKE TWO CAPSULES BY MOUTH EVERY MORNING and TAKE TWO CAPSULES BY MOUTH AT NOON and TAKE TWO CAPSULES BY MOUTH EVERYDAY AT BEDTIME 180 capsule 2   HYDROcodone-acetaminophen (NORCO/VICODIN) 5-325 MG tablet TAKE ONE TABLET BY MOUTH daily AS NEEDED FOR SEVERE pain (migranes) 30 tablet 0   megestrol (MEGACE) 20 MG  tablet TAKE 2 TABLETS EVERY DAY 180 tablet 3   melatonin 10 MG TABS Take 20 mg by mouth at bedtime. 1 tablet 0   midodrine (PROAMATINE) 2.5 MG tablet TAKE 1 TABLET THREE TIMES DAILY WITH MEALS (Patient not taking: Reported on 08/17/2022) 270 tablet 0   mirtazapine (REMERON) 45 MG tablet TAKE 1 TABLET AT BEDTIME 90 tablet 3   montelukast (SINGULAIR) 10 MG tablet TAKE 1 TABLET EVERY DAY 90 tablet 3   omeprazole (PRILOSEC) 40 MG capsule TAKE 1 CAPSULE TWICE DAILY 180 capsule 3   primidone (MYSOLINE) 50 MG tablet Take 0.5 tablets (25 mg total) by mouth at bedtime. 15 tablet 0   rizatriptan (MAXALT-MLT) 10 MG disintegrating tablet Take 1 tablet at onset of headache. May repeat 1 dose in 2 hours if needed. Max 2 tabs in 24 hours. 10 tablet 2   sertraline (ZOLOFT) 100 MG tablet Take 1 tablet (100 mg total) by mouth daily. (Patient not taking: Reported on 08/17/2022) 90 tablet 1   sertraline (ZOLOFT) 50 MG tablet TAKE 1 TABLET EVERY DAY (Patient not taking: Reported on 08/17/2022) 90 tablet 3   SYRINGE-NEEDLE, DISP, 3 ML (LUER LOCK SAFETY SYRINGES) 25G X 1" 3 ML MISC Inject 1 each into the muscle every 30 (thirty) days. 50 each 0   traZODone (DESYREL) 100 MG tablet TAKE 1 TABLET AT BEDTIME AS NEEDED FOR SLEEP 90 tablet 3   Vitamin D, Ergocalciferol, (DRISDOL) 1.25 MG (50000 UNIT) CAPS capsule TAKE 1 CAPSULE EVERY 7 DAYS. (Patient not taking: Reported on 08/17/2022) 12 capsule 1   No current facility-administered medications on file prior to visit.   Past Medical History:  Diagnosis Date   Acquired absence of both breasts and nipples 06/08/2015   Acute infection of nasal sinus 05/04/2021   Aortic atherosclerosis 01/30/2021   Asthma    daily inhalers   Ataxia 10/18/2020   Bilateral carpal tunnel syndrome 01/30/2021   Bipolar disorder, mixed 06/16/2021   Breast cancer of upper-inner quadrant of left female breast 04/15/2015   Carotid artery disease 05/04/2016   Mild, 1-39% bilaterally by doppler in  March 2018   Chronic pain in right shoulder 12/25/2020   COPD with acute exacerbation 07/03/2016   Dental crowns present    Drug induced constipation 04/10/2021   EIC (epidermal inclusion cyst) 03/21/2021   Family history of breast cancer in female 04/26/2015   Dx. Niece at 66; dx. Maternal aunt in her late 57s  Formatting of this note might be different from the original. Overview:  Dx. Niece at 48; dx. Maternal aunt in her late 69s   Family history of colon cancer 04/26/2015   Dx. In maternal grandfather in his late 59s-70  Formatting of this note might be different from the original.  Overview:  Dx. In maternal grandfather in his late 12s-70   Frequent falls 10/18/2020   Genetic testing 06/02/2015   Negative for pathogenic mutations within any of 20 genes on the Breast/Ovarian Cancer Panel through [employer].  No variants of uncertain significance (VUSes) were found.  The Breast/Ovarian Cancer Panel offered by [facility] Basilio Cairo, MD) includes sequencing and deletion/duplication analysis for the following 19 genes:  ATM, BARD1, BRCA1, BRCA2, BRIP1, CDH1, CHEK2, FANCC, M   History of basal cell carcinoma 04/26/2015   History of concussion 01/01/2014   History of kidney stones    History of TIA (transient ischemic attack) 10/2014   Hyperlipidemia    Insomnia    Lumbar back pain 10/18/2020   Major depressive disorder 10/18/2020   Malrotation of intestine 09/23/2017   Migraine aura without headache 02/08/2014   Migraine headache 01/30/2021   Nasal congestion 05/26/2015   will finish z-pak 05/27/2015   Nonproductive cough 05/26/2015   PONV (postoperative nausea and vomiting)    "long time ago"  none recently   Psychogenic syncope 04/23/2019   Right lumbar radiculopathy 04/23/2019   RLS (restless legs syndrome)    Simple chronic bronchitis 07/04/2019   Tobacco use    Tremor 10/18/2020   Vitamin D deficiency    Wears dentures    Weight loss 08/28/2019   Past  Surgical History:  Procedure Laterality Date   ABDOMINAL HYSTERECTOMY     APPENDECTOMY  2000   BREAST BIOPSY Left    BREAST RECONSTRUCTION WITH PLACEMENT OF TISSUE EXPANDER AND FLEX HD (ACELLULAR HYDRATED DERMIS) Bilateral 06/02/2015   Procedure: BILATERAL BREAST RECONSTRUCTION WITH PLACEMENT OF TISSUE EXPANDER AND  ACELLULAR HYDRATED DERMIS;  Surgeon: Glenna Fellows, MD;  Location: [facility];  Service: Plastics;  Laterality: Bilateral;   CARPAL TUNNEL RELEASE Bilateral    CATARACT EXTRACTION Bilateral 09/2013   KNEE ARTHROSCOPY Left    LAPAROSCOPIC ABDOMINAL EXPLORATION N/A 10/2017   LAPAROSCOPIC LYSIS OF CONGENITAL ADHESIVE BAND/LADD'S BANDS WIH INTRAOPERATIVE CHOLANGIOGRAPHY. Perforation of small intestion requiring repair.    LOOP RECORDER INSERTION N/A 05/11/2016   Procedure: Loop Recorder Insertion;  Surgeon: Will Jorja Loa, MD;  Location: [facility];  Service: Cardiovascular;  Laterality: N/A;   MASTECTOMY W/ SENTINEL NODE BIOPSY Bilateral 06/02/2015   Procedure: BILATERAL MASTECTOMY WITH LEFT SENTINEL LYMPH NODE BIOPSY;  Surgeon: Almond Lint, MD;  Location: [facility];  Service: General;  Laterality: Bilateral;   NASAL SEPTUM SURGERY     x 3   PLANTAR FASCIA SURGERY Left    REMOVAL OF BILATERAL TISSUE EXPANDERS WITH PLACEMENT OF BILATERAL BREAST IMPLANTS Bilateral 06/17/2015   Procedure: DEBRIDEMENT OF BILATERAL MASTECTOMY FLAP WITH BILATERAL TISSUE EXPANDER EXCHANGE ;  Surgeon: Glenna Fellows, MD;  Location: [facility];  Service: Plastics;  Laterality: Bilateral;   REMOVAL OF TISSUE EXPANDER Bilateral 07/12/2015   Procedure: REMOVAL OF BILATERAL TISSUE EXPANDERS;  Surgeon: Glenna Fellows, MD;  Location: [facility];  Service: Plastics;  Laterality: Bilateral;    Family History  Problem Relation Age of Onset   Cervical cancer Mother 24   Colon cancer Maternal Grandfather        mets to stomach; dx. 67-70   Heart disease Father     Prostate cancer Father 52   Cervical cancer Maternal Grandmother        dx. 15s; treated with radium implant   Lung cancer Sister 86       maternal half-sister dx. lung cancer, stage III  Breast cancer Maternal Aunt    Cervical cancer Sister 5       paternal half-sister; s/p TAH   Spina bifida Grandchild    Brain cancer Grandchild        grandson dx. at 20 mos, treated at [facility]   Breast cancer Other 51       maternal half-sister's daughter   Cancer Maternal Aunt        d. 9; unspecified type - "began at back area and moved to vital organs"   Cancer Maternal Uncle         late 61s; unspecified type; "started at back and moved to vital organs"   Social History   Socioeconomic History   Marital status: Widowed    Spouse name: Not on file   Number of children: 1   Years of education: 62   Highest education level: Not on file  Occupational History    Comment: [employer], retired  Tobacco Use   Smoking status: Every Day    Current packs/day: 0.25    Types: Cigarettes   Smokeless tobacco: Never   Tobacco comments:    Currently wearing nicotine patches  Vaping Use   Vaping status: Never Used  Substance and Sexual Activity   Alcohol use: Never   Drug use: No   Sexual activity: Not Currently  Other Topics Concern   Not on file  Social History Narrative   Lives alone, widow   Caffeine use- tea, 1 cup daily   Right Handed    Lives in a one story [place] home    Social Determinants of Health   Financial Resource Strain: Low Risk  (02/17/2021)   Overall Financial Resource Strain (CARDIA)    Difficulty of Paying Living Expenses: Not hard at all  Food Insecurity: Food Insecurity Present (03/08/2022)   Hunger Vital Sign    Worried About Running Out of Food in the Last Year: Never true    Ran Out of Food in the Last Year: Sometimes true  Transportation Needs: No Transportation Needs (03/08/2022)   PRAPARE - Administrator, Civil Service (Medical): No     Lack of Transportation (Non-Medical): No  Physical Activity: Inactive (03/08/2022)   Exercise Vital Sign    Days of Exercise per Week: 0 days    Minutes of Exercise per Session: 0 min  Stress: Stress Concern Present (03/08/2022)   [employer] of Occupational Health - Occupational Stress Questionnaire    Feeling of Stress : To some extent  Social Connections: Socially Isolated (05/17/2022)   Social Connection and Isolation Panel [NHANES]    Frequency of Communication with Friends and Family: Twice a week    Frequency of Social Gatherings with Friends and Family: Twice a week    Attends Religious Services: Never    Diplomatic Services operational officer: No    Attends Engineer, structural: Never    Marital Status: Divorced    Objective:  BP 110/60   Pulse 88   Temp (!) 97 F (36.1 C)   Resp 18   Ht 5\' 1"  (1.549 m)   Wt 84 lb (38.1 kg)   BMI 15.87 kg/m      09/19/2022    8:45 AM 08/22/2022    3:31 PM 07/31/2022   11:26 AM  BP/Weight  Systolic BP 110 110 106  Diastolic BP 60 60 50  Wt. (Lbs) 84 87 91.6  BMI 15.87 kg/m2 16.17 kg/m2 17.89 kg/m2  Physical Exam Vitals reviewed.  Constitutional:      Appearance: Normal appearance.     Comments: Thin  Neck:     Vascular: No carotid bruit.  Cardiovascular:     Rate and Rhythm: Normal rate and regular rhythm.     Heart sounds: Normal heart sounds.  Pulmonary:     Effort: Pulmonary effort is normal. No respiratory distress.     Breath sounds: Normal breath sounds.  Abdominal:     General: Abdomen is flat. Bowel sounds are normal.     Palpations: Abdomen is soft.     Tenderness: There is no abdominal tenderness.  Neurological:     Mental Status: She is alert and oriented to person, place, and time.  Psychiatric:        Mood and Affect: Mood normal.        Behavior: Behavior normal.     Diabetic Foot Exam - Simple   No data filed      Lab Results  Component Value Date   WBC 12.8 (H) 06/08/2022    HGB 12.4 06/08/2022   HCT 37.5 06/08/2022   PLT 246 06/08/2022   GLUCOSE 80 06/08/2022   CHOL 181 05/25/2022   TRIG 100 05/25/2022   HDL 53 05/25/2022   LDLCALC 110 (H) 05/25/2022   ALT 12 06/08/2022   AST 14 06/08/2022   NA 141 06/08/2022   K 4.8 06/08/2022   CL 106 06/08/2022   CREATININE 0.78 06/08/2022   BUN 28 (H) 06/08/2022   CO2 23 06/08/2022   TSH 1.700 02/20/2022      Assessment & Plan:    Stenosis of right carotid artery Assessment & Plan: The current medical regimen is effective;  continue present plan and medications.  On Eliquis and Lipitor.  Orders: -     Vitamin B12 -     Methylmalonic acid, serum  Pulmonary embolism and infarction [facility]) Assessment & Plan: Taking eliquis 5 mg 1 oral twice daily    Simple chronic bronchitis (HCC) Assessment & Plan: The current medical regimen is effective;  continue present plan and medications.  Continue  Albuterol nebulizer, albuterol inhaler, Breztri inhaler.  Orders: -     Ambulatory referral to Pulmonology  Mixed hyperlipidemia Assessment & Plan: Well controlled.  No changes to medicines.  Continue Lipitor 10 mg nightly Continue to work on eating a healthy diet and exercise.  Labs drawn today.   Orders: -     CBC with Differential/Platelet -     Comprehensive metabolic panel -     Lipid panel  Vitamin B12 deficiency Assessment & Plan: Check labs.   Severe protein-calorie malnutrition Lily Kocher: less than 60% of standard weight) (HCC) Assessment & Plan: Continue protein shakes.  Continue Remeron. Recommend three meals per day   Sleep disorder Assessment & Plan: The current medical regimen is effective;  continue present plan and medications.  Takes trazodone 100 mg daily at bedtime, amitriptyline 25 mg daily at bedtime.   Orders: -     Ambulatory referral to Pulmonology  Weight loss Assessment & Plan: Continue ensure high protein.   Insomnia due to other mental disorder Assessment &  Plan: Currently taking trazodone 100 mg at bedtime. Amitriptyline 25 mg daily at bedtime.   Xanax 0.25 mg twice daily as needed.     Idiopathic hypotension Assessment & Plan: Continue midodrine 2.5 mg 3 times daily   Migraine aura without headache Assessment & Plan: Restarted [name] samples.  Patient needs patient assistance and I  notified the pharmacy team.      No orders of the defined types were placed in this encounter.   Orders Placed This Encounter  Procedures   CBC with Differential/Platelet   Comprehensive metabolic panel   Lipid panel   Vitamin B12   Methylmalonic acid, serum   Ambulatory referral to Pulmonology     Follow-up: No follow-ups on file.   I,Marla I Leal-Borjas,acting as a scribe for Blane Ohara, MD.,have documented all relevant documentation on the behalf of Blane Ohara, MD,as directed by  Blane Ohara, MD while in the presence of Blane Ohara, MD.   An After Visit Summary was printed and given to the patient.  Blane Ohara, MD [facility] [phone]

## 2022-09-20 ENCOUNTER — Telehealth: Payer: Self-pay

## 2022-09-22 NOTE — Assessment & Plan Note (Signed)
The current medical regimen is effective;  continue present plan and medications.  Takes trazodone 100 mg daily at bedtime, amitriptyline 25 mg daily at bedtime.

## 2022-09-22 NOTE — Assessment & Plan Note (Signed)
Check labs 

## 2022-09-22 NOTE — Assessment & Plan Note (Signed)
The current medical regimen is effective;  continue present plan and medications. Recommend three meals per day

## 2022-09-23 ENCOUNTER — Encounter: Payer: Self-pay | Admitting: Family Medicine

## 2022-09-23 NOTE — Assessment & Plan Note (Signed)
Restarted [name] samples.  Patient needs patient assistance and I notified the pharmacy team.

## 2022-09-23 NOTE — Assessment & Plan Note (Signed)
Continue midodrine 2.5 mg 3 times daily

## 2022-09-24 NOTE — Progress Notes (Signed)
   09/20/22  Patient ID: Anne [name], female   DOB: 1945/11/29, 77 y.o.   MRN: [id number]  Patient outreach to follow-up on medication access/adherence  -Patient states she did not receive the Eliquis PAP application sent from medication assistance team- will have the team resend -She is down to a couple of days remaining on the sample provided by Dr. [name] office- will see if they are able to provide the patient with another sample -She has received the PAP supplies Breztri (3 inhalers) -Patient previously had PAP for Quilipta through [facility], and Dr. [name] would like for team to look into re-enrolling the patient.  For now, she has been provided samples  Coordinating with medication assistance team, patient, and Dr. [name] to complete Eliquis 5mg  BID and [name] 60mg  daily PAP applications.  Will follow-up with patient in 1 week to make sure applications are received and being worked on.  [name], PharmD, DPLA

## 2022-09-26 NOTE — Telephone Encounter (Signed)
PAP application for ELIQUIS from  [facility]) has beenMAILED to pt home.   PLEASE BE ADVISED THAT THIS IS THE 2ND APP MAILED TO HOME

## 2022-09-26 NOTE — Telephone Encounter (Signed)
PAP application for [name] 60MG  FROM [employer] has been MAILEDto pt home.   PLEASE BE ADVISE THIS THE SECOND APP MAILED OUT.

## 2022-09-27 LAB — COMPREHENSIVE METABOLIC PANEL
ALT: 9 IU/L (ref 0–32)
AST: 14 IU/L (ref 0–40)
Albumin: 4 g/dL (ref 3.8–4.8)
Alkaline Phosphatase: 90 IU/L (ref 44–121)
BUN: 19 mg/dL (ref 8–27)
Bilirubin Total: <0.2 mg/dL (ref 0.0–1.2)
Calcium: 9.1 mg/dL (ref 8.7–10.3)
Chloride: 108 mmol/L — ABNORMAL HIGH (ref 96–106)
Creatinine, Ser: 0.92 mg/dL (ref 0.57–1.00)
Globulin, Total: 2 g/dL (ref 1.5–4.5)
Glucose: 148 mg/dL — ABNORMAL HIGH (ref 70–99)
Potassium: 4.1 mmol/L (ref 3.5–5.2)
Total Protein: 6 g/dL (ref 6.0–8.5)
eGFR: 65 mL/min/{1.73_m2} (ref >59)

## 2022-09-27 LAB — LIPID PANEL
Chol/HDL Ratio: 3.5 ratio (ref 0.0–4.4)
Cholesterol, Total: 171 mg/dL (ref 100–199)
HDL: 49 mg/dL (ref >39)
LDL Chol Calc (NIH): 104 mg/dL — ABNORMAL HIGH (ref 0–99)
Triglycerides: 100 mg/dL (ref 0–149)
VLDL Cholesterol Cal: 18 mg/dL (ref 5–40)

## 2022-09-27 LAB — CBC WITH DIFFERENTIAL/PLATELET
Basophils Absolute: 0.1 10*3/uL (ref 0.0–0.2)
Eos: 1 %
Hematocrit: 35.4 % (ref 34.0–46.6)
Hemoglobin: 11.2 g/dL (ref 11.1–15.9)
Immature Grans (Abs): 0.1 10*3/uL (ref 0.0–0.1)
Lymphocytes Absolute: 2.1 10*3/uL (ref 0.7–3.1)
MCH: 28.9 pg (ref 26.6–33.0)
MCHC: 31.6 g/dL (ref 31.5–35.7)
MCV: 91 fL (ref 79–97)
Monocytes: 8 %
Neutrophils Absolute: 6.7 10*3/uL (ref 1.4–7.0)
Neutrophils: 68 %
Platelets: 293 10*3/uL (ref 150–450)
RBC: 3.88 x10E6/uL (ref 3.77–5.28)
RDW: 12 % (ref 11.7–15.4)
WBC: 9.9 10*3/uL (ref 3.4–10.8)

## 2022-09-27 LAB — METHYLMALONIC ACID, SERUM: Methylmalonic Acid: 348 nmol/L (ref 0–378)

## 2022-10-03 ENCOUNTER — Telehealth: Payer: Self-pay

## 2022-10-03 NOTE — Telephone Encounter (Signed)
Agree. Dr. Cox  

## 2022-10-03 NOTE — Telephone Encounter (Signed)
Patient called spoke with [employer] stating she has had a cough and shob for the past 2 weeks. Appt scheduled for tomorrow. Call transferred to me to  triage, asked if she has used her breztri inhaler which she has and has helped some. She had not used her nebulizer, advised she can use the nebulizer as directed. If no improvement or feels worse to call our office or go to the ED. Patient agreed and verbalized understanding.

## 2022-10-03 NOTE — Progress Notes (Signed)
   10/03/2022  Patient ID: Anne [name], female   DOB: 1945-08-22, 77 y.o.   MRN: [id number]  Patient outreach to check on PAP applications for Eliquis and [name]  - Patient has rec'd both and will fill out and mail back in tomorrow - She endorses running out of [name] and Eliquis samples- I will see if Dr. [name] office has additional samples she can get while we work on PAP - Patient mentions recent difficulty speaking (soft, raspy voice) and some mild shortness of breath- I will notify Dr. [name] but recommended she reach out for an appointment  [name], PharmD, DPLA

## 2022-10-04 ENCOUNTER — Ambulatory Visit (INDEPENDENT_AMBULATORY_CARE_PROVIDER_SITE_OTHER): Payer: Medicare HMO | Admitting: Family Medicine

## 2022-10-04 VITALS — BP 110/60 | HR 116 | Temp 96.8°F | Ht 60.0 in | Wt 85.2 lb

## 2022-10-04 DIAGNOSIS — G43109 Migraine with aura, not intractable, without status migrainosus: Secondary | ICD-10-CM

## 2022-10-04 DIAGNOSIS — J410 Simple chronic bronchitis: Secondary | ICD-10-CM | POA: Diagnosis not present

## 2022-10-04 DIAGNOSIS — M5416 Radiculopathy, lumbar region: Secondary | ICD-10-CM

## 2022-10-04 DIAGNOSIS — E43 Unspecified severe protein-calorie malnutrition: Secondary | ICD-10-CM

## 2022-10-04 DIAGNOSIS — J301 Allergic rhinitis due to pollen: Secondary | ICD-10-CM | POA: Diagnosis not present

## 2022-10-04 DIAGNOSIS — R634 Abnormal weight loss: Secondary | ICD-10-CM | POA: Diagnosis not present

## 2022-10-04 MED ORDER — RIZATRIPTAN BENZOATE 10 MG PO TBDP: ORAL_TABLET | ORAL | 2 refills | Status: DC

## 2022-10-04 MED ORDER — QULIPTA 60 MG PO TABS
1.0000 | ORAL_TABLET | Freq: Every day | ORAL | 3 refills | Status: DC
Start: 2022-10-04 — End: 2022-11-29

## 2022-10-04 NOTE — Patient Instructions (Signed)
Start Zyrtec 10 mg nightly.  Please check to see if you are taking the midodrine at home.  Should be 2.5 mg 3 times daily.

## 2022-10-04 NOTE — Progress Notes (Signed)
Acute Office Visit  Subjective:    Patient ID: Anne [name], female    DOB: 1945/03/06, 77 y.o.   MRN: [id number]  Chief Complaint  Patient presents with   Cough    HPI: Patient is in today for cough and voice change x 2 weeks.  Patient denies shortness of breath, headaches, fatigue and dizziness.  Poor appetite.  She is not taking protein shakes.  She has not eaten today.  Currently using Breztri 2 puffs twice daily.  Has albuterol inhaler and nebulizers.  Past Medical History:  Diagnosis Date   Acquired absence of both breasts and nipples 06/08/2015   Acute infection of nasal sinus 05/04/2021   Aortic atherosclerosis 01/30/2021   Asthma    daily inhalers   Ataxia 10/18/2020   Bilateral carpal tunnel syndrome 01/30/2021   Bipolar disorder, mixed 06/16/2021   Breast cancer of upper-inner quadrant of left female breast 04/15/2015   Carotid artery disease 05/04/2016   Mild, 1-39% bilaterally by doppler in March 2018   Chronic pain in right shoulder 12/25/2020   COPD with acute exacerbation 07/03/2016   Dental crowns present    Drug induced constipation 04/10/2021   EIC (epidermal inclusion cyst) 03/21/2021   Family history of breast cancer in female 04/26/2015   Dx. Niece at 80; dx. Maternal aunt in her late 41s  Formatting of this note might be different from the original. Overview:  Dx. Niece at 59; dx. Maternal aunt in her late 36s   Family history of colon cancer 04/26/2015   Dx. In maternal grandfather in his late 72s-70  Formatting of this note might be different from the original. Overview:  Dx. In maternal grandfather in his late 34s-70   Frequent falls 10/18/2020   Genetic testing 06/02/2015   Negative for pathogenic mutations within any of 20 genes on the Breast/Ovarian Cancer Panel through [employer].  No variants of uncertain significance (VUSes) were found.  The Breast/Ovarian Cancer Panel offered by [facility] Basilio Cairo, MD) includes  sequencing and deletion/duplication analysis for the following 19 genes:  ATM, BARD1, BRCA1, BRCA2, BRIP1, CDH1, CHEK2, FANCC, M   History of basal cell carcinoma 04/26/2015   History of concussion 01/01/2014   History of kidney stones    History of TIA (transient ischemic attack) 10/2014   Hyperlipidemia    Insomnia    Lumbar back pain 10/18/2020   Major depressive disorder 10/18/2020   Malrotation of intestine 09/23/2017   Migraine aura without headache 02/08/2014   Migraine headache 01/30/2021   Nasal congestion 05/26/2015   will finish z-pak 05/27/2015   Nonproductive cough 05/26/2015   PONV (postoperative nausea and vomiting)    "long time ago"  none recently   Psychogenic syncope 04/23/2019   Right lumbar radiculopathy 04/23/2019   RLS (restless legs syndrome)    Simple chronic bronchitis 07/04/2019   Tobacco use    Tremor 10/18/2020   Vitamin D deficiency    Wears dentures    Weight loss 08/28/2019    Past Surgical History:  Procedure Laterality Date   ABDOMINAL HYSTERECTOMY     APPENDECTOMY  2000   BREAST BIOPSY Left    BREAST RECONSTRUCTION WITH PLACEMENT OF TISSUE EXPANDER AND FLEX HD (ACELLULAR HYDRATED DERMIS) Bilateral 06/02/2015   Procedure: BILATERAL BREAST RECONSTRUCTION WITH PLACEMENT OF TISSUE EXPANDER AND  ACELLULAR HYDRATED DERMIS;  Surgeon: Glenna Fellows, MD;  Location: [facility];  Service: Plastics;  Laterality: Bilateral;   CARPAL TUNNEL RELEASE Bilateral  CATARACT EXTRACTION Bilateral 09/2013   KNEE ARTHROSCOPY Left    LAPAROSCOPIC ABDOMINAL EXPLORATION N/A 10/2017   LAPAROSCOPIC LYSIS OF CONGENITAL ADHESIVE BAND/LADD'S BANDS WIH INTRAOPERATIVE CHOLANGIOGRAPHY. Perforation of small intestion requiring repair.    LOOP RECORDER INSERTION N/A 05/11/2016   Procedure: Loop Recorder Insertion;  Surgeon: Will Jorja Loa, MD;  Location: [facility];  Service: Cardiovascular;  Laterality: N/A;   MASTECTOMY W/ SENTINEL NODE BIOPSY  Bilateral 06/02/2015   Procedure: BILATERAL MASTECTOMY WITH LEFT SENTINEL LYMPH NODE BIOPSY;  Surgeon: Almond Lint, MD;  Location: [facility];  Service: General;  Laterality: Bilateral;   NASAL SEPTUM SURGERY     x 3   PLANTAR FASCIA SURGERY Left    REMOVAL OF BILATERAL TISSUE EXPANDERS WITH PLACEMENT OF BILATERAL BREAST IMPLANTS Bilateral 06/17/2015   Procedure: DEBRIDEMENT OF BILATERAL MASTECTOMY FLAP WITH BILATERAL TISSUE EXPANDER EXCHANGE ;  Surgeon: Glenna Fellows, MD;  Location: [facility];  Service: Plastics;  Laterality: Bilateral;   REMOVAL OF TISSUE EXPANDER Bilateral 07/12/2015   Procedure: REMOVAL OF BILATERAL TISSUE EXPANDERS;  Surgeon: Glenna Fellows, MD;  Location: [facility];  Service: Plastics;  Laterality: Bilateral;    Family History  Problem Relation Age of Onset   Cervical cancer Mother 78   Colon cancer Maternal Grandfather        mets to stomach; dx. 67-70   Heart disease Father    Prostate cancer Father 77   Cervical cancer Maternal Grandmother        dx. 29s; treated with radium implant   Lung cancer Sister 23       maternal half-sister dx. lung cancer, stage III   Breast cancer Maternal Aunt    Cervical cancer Sister 4       paternal half-sister; s/p TAH   Spina bifida Grandchild    Brain cancer Grandchild        grandson dx. at 20 mos, [facility]   Breast cancer Other 51       maternal half-sister's daughter   Cancer Maternal Aunt        d. 30; unspecified type - "began at back area and moved to vital organs"   Cancer Maternal Uncle         late 75s; unspecified type; "started at back and moved to vital organs"    Social History   Socioeconomic History   Marital status: Widowed    Spouse name: Not on file   Number of children: 1   Years of education: 7   Highest education level: Not on file  Occupational History    Comment: [employer], retired  Tobacco Use   Smoking status: Every Day    Current  packs/day: 0.25    Types: Cigarettes   Smokeless tobacco: Never   Tobacco comments:    Currently wearing nicotine patches  Vaping Use   Vaping status: Never Used  Substance and Sexual Activity   Alcohol use: Never   Drug use: No   Sexual activity: Not Currently  Other Topics Concern   Not on file  Social History Narrative   Lives alone, widow   Caffeine use- tea, 1 cup daily   Right Handed    Lives in a one story [place] home    Social Determinants of Health   Financial Resource Strain: Low Risk  (02/17/2021)   Overall Financial Resource Strain (CARDIA)    Difficulty of Paying Living Expenses: Not hard at all  Food Insecurity: Food Insecurity  Present (03/08/2022)   Hunger Vital Sign    Worried About Running Out of Food in the Last Year: Never true    Ran Out of Food in the Last Year: Sometimes true  Transportation Needs: No Transportation Needs (03/08/2022)   PRAPARE - Administrator, Civil Service (Medical): No    Lack of Transportation (Non-Medical): No  Physical Activity: Inactive (03/08/2022)   Exercise Vital Sign    Days of Exercise per Week: 0 days    Minutes of Exercise per Session: 0 min  Stress: Stress Concern Present (03/08/2022)   [employer] of Occupational Health - Occupational Stress Questionnaire    Feeling of Stress : To some extent  Social Connections: Socially Isolated (05/17/2022)   Social Connection and Isolation Panel [NHANES]    Frequency of Communication with Friends and Family: Twice a week    Frequency of Social Gatherings with Friends and Family: Twice a week    Attends Religious Services: Never    Database administrator or Organizations: No    Attends Banker Meetings: Never    Marital Status: Divorced  Catering manager Violence: Not At Risk (05/17/2022)   Humiliation, Afraid, Rape, and Kick questionnaire    Fear of Current or Ex-Partner: No    Emotionally Abused: No    Physically Abused: No    Sexually Abused: No     Outpatient Medications Prior to Visit  Medication Sig Dispense Refill   albuterol (PROVENTIL) (2.5 MG/3ML) 0.083% nebulizer solution Take 2.5 mg by nebulization 4 (four) times daily.     albuterol (VENTOLIN HFA) 108 (90 Base) MCG/ACT inhaler INHALE 1-2 PUFFS BY MOUTH into THE lungs every SIX hours AS NEEDED FOR WHEEZING AND/OR SHORTNESS OF BREATH 8.5 g 2   ALPRAZolam (XANAX) 0.25 MG tablet Take 1 tablet (0.25 mg total) by mouth 2 (two) times daily as needed for anxiety. 60 tablet 0   amitriptyline (ELAVIL) 25 MG tablet Take 1 tablet (25 mg total) by mouth at bedtime. 90 tablet 0   apixaban (ELIQUIS) 5 MG TABS tablet Take 5 mg by mouth.     atorvastatin (LIPITOR) 10 MG tablet Take 1 tablet (10 mg total) by mouth daily. 90 tablet 1   Budeson-Glycopyrrol-Formoterol (BREZTRI AEROSPHERE) 160-9-4.8 MCG/ACT AERO Inhale 2 puffs into the lungs 2 (two) times daily. 3 each 3   Cyanocobalamin (B-12 COMPLIANCE INJECTION) 1000 MCG/ML KIT Inject 1 mL as directed every 30 (thirty) days.     gabapentin (NEURONTIN) 300 MG capsule TAKE TWO CAPSULES BY MOUTH EVERY MORNING and TAKE TWO CAPSULES BY MOUTH AT NOON and TAKE TWO CAPSULES BY MOUTH EVERYDAY AT BEDTIME 180 capsule 2   megestrol (MEGACE) 20 MG tablet TAKE 2 TABLETS EVERY DAY 180 tablet 3   melatonin 10 MG TABS Take 20 mg by mouth at bedtime. 1 tablet 0   midodrine (PROAMATINE) 2.5 MG tablet TAKE 1 TABLET THREE TIMES DAILY WITH MEALS (Patient not taking: Reported on 08/17/2022) 270 tablet 0   mirtazapine (REMERON) 45 MG tablet TAKE 1 TABLET AT BEDTIME 90 tablet 3   montelukast (SINGULAIR) 10 MG tablet TAKE 1 TABLET EVERY DAY 90 tablet 3   omeprazole (PRILOSEC) 40 MG capsule TAKE 1 CAPSULE TWICE DAILY 180 capsule 3   primidone (MYSOLINE) 50 MG tablet Take 0.5 tablets (25 mg total) by mouth at bedtime. 15 tablet 0   sertraline (ZOLOFT) 100 MG tablet Take 1 tablet (100 mg total) by mouth daily. (Patient not taking: Reported on 08/17/2022) 90  tablet 1    sertraline (ZOLOFT) 50 MG tablet TAKE 1 TABLET EVERY DAY (Patient not taking: Reported on 08/17/2022) 90 tablet 3   SYRINGE-NEEDLE, DISP, 3 ML (LUER LOCK SAFETY SYRINGES) 25G X 1" 3 ML MISC Inject 1 each into the muscle every 30 (thirty) days. 50 each 0   traZODone (DESYREL) 100 MG tablet TAKE 1 TABLET AT BEDTIME AS NEEDED FOR SLEEP 90 tablet 3   Vitamin D, Ergocalciferol, (DRISDOL) 1.25 MG (50000 UNIT) CAPS capsule TAKE 1 CAPSULE EVERY 7 DAYS. (Patient not taking: Reported on 08/17/2022) 12 capsule 1   Atogepant ([name]) 60 MG TABS Take 1 tablet (60 mg total) by mouth daily. 90 tablet 3   HYDROcodone-acetaminophen (NORCO/VICODIN) 5-325 MG tablet TAKE ONE TABLET BY MOUTH daily AS NEEDED FOR SEVERE pain (migranes) 30 tablet 0   rizatriptan (MAXALT-MLT) 10 MG disintegrating tablet Take 1 tablet at onset of headache. May repeat 1 dose in 2 hours if needed. Max 2 tabs in 24 hours. 10 tablet 2   No facility-administered medications prior to visit.    Allergies  Allergen Reactions   Clindamycin/Lincomycin Nausea And Vomiting   Lyrica [Pregabalin] Other (See Comments)    PERSONALITY CHANGE    Review of Systems  Constitutional:  Positive for fatigue and unexpected weight change. Negative for chills and fever.  HENT:  Negative for congestion, rhinorrhea and sore throat.   Respiratory:  Positive for cough and shortness of breath.   Cardiovascular:  Negative for chest pain.  Gastrointestinal:  Negative for abdominal pain, constipation, diarrhea, nausea and vomiting.  Genitourinary:  Negative for dysuria and urgency.  Musculoskeletal:  Positive for back pain. Negative for myalgias.  Neurological:  Positive for light-headedness and headaches. Negative for dizziness and weakness.  Psychiatric/Behavioral:  Positive for dysphoric mood and sleep disturbance. The patient is not nervous/anxious.        Objective:        10/04/2022    2:45 PM 09/19/2022    8:45 AM 08/22/2022    3:31 PM  Vitals with BMI   Height 5\' 0"  5\' 1"  5' 1.5"  Weight 85 lbs 3 oz 84 lbs 87 lbs  BMI 16.64 15.88 16.17  Systolic 110 110 536  Diastolic 60 60 60  Pulse 116 88 115    Orthostatic VS for the past 72 hrs (Last 3 readings):  Orthostatic BP Patient Position BP Location Cuff Size Orthostatic Pulse  10/04/22 1551 (!) 82/60 Standing Left Arm -- 88  10/04/22 1550 92/60 Sitting Left Arm -- 84  10/04/22 1549 104/56 -- Left Arm Small 88     Physical Exam Vitals reviewed.  Constitutional:      Comments: thin  HENT:     Right Ear: Tympanic membrane, ear canal and external ear normal.     Left Ear: Tympanic membrane, ear canal and external ear normal.     Nose: Congestion (pale turbinates) present.     Right Turbinates: Swollen and pale.     Left Turbinates: Swollen and pale.     Mouth/Throat:     Pharynx: Oropharynx is clear. No oropharyngeal exudate.  Cardiovascular:     Rate and Rhythm: Normal rate and regular rhythm.     Heart sounds: Normal heart sounds. No murmur heard. Pulmonary:     Effort: Pulmonary effort is normal.     Comments: Decreased breath sounds. Did not improve with albuterol nebulizer. Neurological:     Mental Status: She is alert and oriented to person, place, and time.  Psychiatric:        Mood and Affect: Mood normal.        Behavior: Behavior normal.     Health Maintenance Due  Topic Date Due   Hepatitis C Screening  Never done   COVID-19 Vaccine (3 - Moderna risk series) 11/26/2021   Zoster Vaccines- Shingrix (2 of 2) 04/02/2022   Lung Cancer Screening  05/30/2022   INFLUENZA VACCINE  10/04/2022    There are no preventive care reminders to display for this patient.   Lab Results  Component Value Date   TSH 1.700 02/20/2022   Lab Results  Component Value Date   WBC 9.9 09/19/2022   HGB 11.2 09/19/2022   HCT 35.4 09/19/2022   MCV 91 09/19/2022   PLT 293 09/19/2022   Lab Results  Component Value Date   NA 144 09/19/2022   K 4.1 09/19/2022   CO2 21 09/19/2022    GLUCOSE 148 (H) 09/19/2022   BUN 19 09/19/2022   CREATININE 0.92 09/19/2022   BILITOT <0.2 09/19/2022   ALKPHOS 90 09/19/2022   AST 14 09/19/2022   ALT 9 09/19/2022   PROT 6.0 09/19/2022   ALBUMIN 4.0 09/19/2022   CALCIUM 9.1 09/19/2022   ANIONGAP 3 (L) 07/03/2016   EGFR 65 09/19/2022   Lab Results  Component Value Date   CHOL 171 09/19/2022   Lab Results  Component Value Date   HDL 49 09/19/2022   Lab Results  Component Value Date   LDLCALC 104 (H) 09/19/2022   Lab Results  Component Value Date   TRIG 100 09/19/2022   Lab Results  Component Value Date   CHOLHDL 3.5 09/19/2022   No results found for: "HGBA1C"     Assessment & Plan:  Weight loss Assessment & Plan: Strongly recommended patient eat 3 meals a day.  Also recommend protein shakes 2-3 times per day.   Migraine aura without headache Assessment & Plan: Samples of [name]  given.  Message sent to chronic care management pharmacist to try to get patient assistance.  Orders: Bennie Pierini; Take 1 tablet (60 mg total) by mouth daily.  Dispense: 90 tablet; Refill: 3  Right lumbar radiculopathy Assessment & Plan: Continue gabapentin.  Continue hydrocodone sparingly.  Orders: -     HYDROcodone-Acetaminophen; TAKE ONE TABLET BY MOUTH daily AS NEEDED FOR SEVERE pain (migranes)  Dispense: 30 tablet; Refill: 0  Simple chronic bronchitis (HCC) Assessment & Plan: Continue Breztri 2 puffs twice daily. Continue albuterol inhaler 4 times daily as needed shortness of breath or wheezing. I am concerned that much of her shortness of breath is related to allergies today. Patient also needs to quit smoking.   Non-seasonal allergic rhinitis due to pollen Assessment & Plan: Add Zyrtec 10 mg nightly   Severe protein-calorie malnutrition Lily Kocher: less than 60% of standard weight) (HCC) Assessment & Plan: Strongly recommended patient eat 3 meals a day.  Also recommend protein shakes 2-3 times per day.   Other  orders -     Rizatriptan Benzoate; Take 1 tablet at onset of headache. May repeat 1 dose in 2 hours if needed. Max 2 tabs in 24 hours.  Dispense: 10 tablet; Refill: 2     Meds ordered this encounter  Medications   Atogepant ([name]) 60 MG TABS    Sig: Take 1 tablet (60 mg total) by mouth daily.    Dispense:  90 tablet    Refill:  3    This prescription was filled  on 05/31/2022. Any refills authorized will be placed on file.   rizatriptan (MAXALT-MLT) 10 MG disintegrating tablet    Sig: Take 1 tablet at onset of headache. May repeat 1 dose in 2 hours if needed. Max 2 tabs in 24 hours.    Dispense:  10 tablet    Refill:  2    This prescription was filled on 05/31/2022. Any refills authorized will be placed on file.   HYDROcodone-acetaminophen (NORCO/VICODIN) 5-325 MG tablet    Sig: TAKE ONE TABLET BY MOUTH daily AS NEEDED FOR SEVERE pain (migranes)    Dispense:  30 tablet    Refill:  0    No orders of the defined types were placed in this encounter.    Follow-up: Return in about 6 weeks (around 11/15/2022) for chronic follow up.  An After Visit Summary was printed and given to the patient.  Blane Ohara, MD  Family Practice [phone]

## 2022-10-07 ENCOUNTER — Encounter: Payer: Self-pay | Admitting: Family Medicine

## 2022-10-07 MED ORDER — IPRATROPIUM-ALBUTEROL 0.5-2.5 (3) MG/3ML IN SOLN
3.0000 mL | Freq: Once | RESPIRATORY_TRACT | Status: AC
Start: 2022-10-07 — End: ?
  Administered 2022-12-03: 3 mL via RESPIRATORY_TRACT

## 2022-10-07 MED ORDER — HYDROCODONE-ACETAMINOPHEN 5-325 MG PO TABS: ORAL_TABLET | ORAL | 0 refills | Status: DC

## 2022-10-07 NOTE — Assessment & Plan Note (Signed)
Samples of [name]  given.  Message sent to chronic care management pharmacist to try to get patient assistance.

## 2022-10-07 NOTE — Assessment & Plan Note (Signed)
Continue gabapentin.  Continue hydrocodone sparingly.

## 2022-10-07 NOTE — Assessment & Plan Note (Signed)
Strongly recommended patient eat 3 meals a day.  Also recommend protein shakes 2-3 times per day.

## 2022-10-07 NOTE — Assessment & Plan Note (Signed)
Add Zyrtec 10 mg nightly

## 2022-10-07 NOTE — Assessment & Plan Note (Signed)
Continue Breztri 2 puffs twice daily. Continue albuterol inhaler 4 times daily as needed shortness of breath or wheezing. I am concerned that much of her shortness of breath is related to allergies today. Patient also needs to quit smoking.

## 2022-10-09 ENCOUNTER — Other Ambulatory Visit: Payer: Self-pay

## 2022-10-09 DIAGNOSIS — K117 Disturbances of salivary secretion: Secondary | ICD-10-CM

## 2022-10-09 DIAGNOSIS — F332 Major depressive disorder, recurrent severe without psychotic features: Secondary | ICD-10-CM

## 2022-10-09 DIAGNOSIS — G43109 Migraine with aura, not intractable, without status migrainosus: Secondary | ICD-10-CM

## 2022-10-09 DIAGNOSIS — G479 Sleep disorder, unspecified: Secondary | ICD-10-CM

## 2022-10-09 MED ORDER — AMITRIPTYLINE HCL 25 MG PO TABS
25.0000 mg | ORAL_TABLET | Freq: Every day | ORAL | 0 refills | Status: DC
Start: 2022-10-09 — End: 2022-10-23

## 2022-10-09 MED ORDER — ALPRAZOLAM 0.25 MG PO TABS
0.2500 mg | ORAL_TABLET | Freq: Two times a day (BID) | ORAL | 0 refills | Status: DC | PRN
Start: 2022-10-09 — End: 2022-10-23

## 2022-10-09 MED ORDER — RIZATRIPTAN BENZOATE 10 MG PO TBDP: ORAL_TABLET | ORAL | 2 refills | Status: DC

## 2022-10-11 ENCOUNTER — Other Ambulatory Visit: Payer: Self-pay | Admitting: Family Medicine

## 2022-10-11 ENCOUNTER — Telehealth: Payer: Self-pay

## 2022-10-11 MED ORDER — MIDODRINE HCL 5 MG PO TABS: 5.0000 mg | ORAL_TABLET | Freq: Three times a day (TID) | ORAL | 1 refills | Status: DC

## 2022-10-11 NOTE — Telephone Encounter (Signed)
Patient called with c/o falling when she stands up.  She confirms that she is taking her midodrine as prescribed.  Per Dr [name] she is going to increase the dose and wants to see her back in 2-3 weeks.

## 2022-10-15 ENCOUNTER — Telehealth: Payer: Self-pay

## 2022-10-15 NOTE — Progress Notes (Signed)
   10/15/2022  Patient ID: Anne [name], female   DOB: 1945/04/30, 77 y.o.   MRN: [id number]  Patient outreach attempt to follow back up with Anne [name] in regard to her Eliquis and [name] PAP applications.  I spoke with her 7/31, and she had received the application and was going to complete and return them.  There is no documentation that these have been received.  I also was not able to reach the patient to see if these have been completed.  She was provided with samples of both medications by Dr. [name] office on 8/1.  I will try to contact her again in a week.  Anne [name], PharmD, DPLA

## 2022-10-17 ENCOUNTER — Telehealth: Payer: Self-pay

## 2022-10-17 NOTE — Telephone Encounter (Signed)
Patient called and stated that her trimmers has came back and she can't remember if she was put on medication for it. Wanted to know does she need to come in for an appointment for this? Please advise

## 2022-10-18 NOTE — Telephone Encounter (Signed)
Patient should be on primidone 50 mg half pill daily.  This should help with her tremors.  She also has Xanax that she is to take as needed but sparingly.  This also may help with tremors. Dr. [name]

## 2022-10-19 ENCOUNTER — Other Ambulatory Visit: Payer: Self-pay | Admitting: Family Medicine

## 2022-10-19 DIAGNOSIS — E782 Mixed hyperlipidemia: Secondary | ICD-10-CM

## 2022-10-19 NOTE — Telephone Encounter (Signed)
Called patient, unable to reach patient

## 2022-10-22 ENCOUNTER — Telehealth: Payer: Self-pay

## 2022-10-22 NOTE — Telephone Encounter (Signed)
The patient sent a mychart message 3 days ago requesting an appt for Low blood pressure when standing. I spoke with Dr. [name] who asked me to get her an appt today with Dr. Faylene Kurtz. I tried to contact the patient by phone but was unable to speak with her and was unable to leave a message. I have responded back to the mychart message asking her to call the office.

## 2022-10-22 NOTE — Telephone Encounter (Signed)
Per Dr.Cox if the patient calls back this morning as of right now Huston Foley has an opening or we can schedule with Dr. Faylene Kurtz for this afternoon.

## 2022-10-23 ENCOUNTER — Telehealth: Payer: Self-pay

## 2022-10-23 ENCOUNTER — Ambulatory Visit: Payer: Medicare PPO | Admitting: Family Medicine

## 2022-10-23 VITALS — BP 112/60 | HR 96 | Temp 97.2°F | Ht 60.0 in | Wt 89.0 lb

## 2022-10-23 DIAGNOSIS — F332 Major depressive disorder, recurrent severe without psychotic features: Secondary | ICD-10-CM | POA: Diagnosis not present

## 2022-10-23 DIAGNOSIS — E43 Unspecified severe protein-calorie malnutrition: Secondary | ICD-10-CM

## 2022-10-23 DIAGNOSIS — E559 Vitamin D deficiency, unspecified: Secondary | ICD-10-CM | POA: Diagnosis not present

## 2022-10-23 DIAGNOSIS — E538 Deficiency of other specified B group vitamins: Secondary | ICD-10-CM

## 2022-10-23 DIAGNOSIS — G43109 Migraine with aura, not intractable, without status migrainosus: Secondary | ICD-10-CM | POA: Diagnosis not present

## 2022-10-23 DIAGNOSIS — R270 Ataxia, unspecified: Secondary | ICD-10-CM | POA: Diagnosis not present

## 2022-10-23 DIAGNOSIS — I950 Idiopathic hypotension: Secondary | ICD-10-CM

## 2022-10-23 DIAGNOSIS — R413 Other amnesia: Secondary | ICD-10-CM | POA: Diagnosis not present

## 2022-10-23 MED ORDER — TRAZODONE HCL 100 MG PO TABS: 50.0000 mg | ORAL_TABLET | Freq: Every evening | ORAL | Status: DC | PRN

## 2022-10-23 MED ORDER — SERTRALINE HCL 100 MG PO TABS
100.0000 mg | ORAL_TABLET | Freq: Every day | ORAL | Status: DC
Start: 2022-10-23 — End: 2022-11-05

## 2022-10-23 MED ORDER — GABAPENTIN 300 MG PO CAPS: 300.0000 mg | ORAL_CAPSULE | Freq: Three times a day (TID) | ORAL | Status: DC

## 2022-10-23 MED ORDER — VITAMIN D (ERGOCALCIFEROL) 1.25 MG (50000 UNIT) PO CAPS
50000.0000 [IU] | ORAL_CAPSULE | ORAL | 1 refills | Status: DC
Start: 2022-10-23 — End: 2023-01-16

## 2022-10-23 NOTE — Telephone Encounter (Signed)
I have tried to contact the patient. The patient needs to bring a copy of her household income/tax information for her patient assistance. Waiting for the patient to call back. I was unable to leave a message.

## 2022-10-23 NOTE — Patient Instructions (Addendum)
Discontinue Xanax Discontinue amitriptyline Discontinue primidone. Decrease gabapentin to 300 mg one three times a day. Decrease trazodone to 50 mg once daily.   restart vitamin D 50,000 once weekly.   Order mri of brain  Refer to [facility] health for nutritionist as well as physical therapy for strengthening and balance issues.

## 2022-10-23 NOTE — Progress Notes (Unsigned)
Acute Office Visit  Subjective:    Patient ID: Anne [name], female    DOB: December 28, 1945, 77 y.o.   MRN: [id number]  Chief Complaint  Patient presents with   Hypotension   Tremors    HPI: Patient is in today for low blood pressure and tremors, states she is unable to walk from the kitchen to her living room with a cup of coffee due to tremors, difficult to write and it be legible, this has been a issue for several months.   Hypotension- states midodrine helps some, states when she goes from a sitting to standing position at times she will get dizzy. Orthostatics done during last visit per patient  Poor appetite.  Patient is also feeling dehydrated.  Patient drinks water, iced tea, coffee (1 cup/day) and eats supper.  No other food during the day.  Patient drinks 1 Ensure a day.  Patient has issues with depression, sleep issues averaging 3 to 4 hours per night, and memory problems.  Past Medical History:  Diagnosis Date   Acquired absence of both breasts and nipples 06/08/2015   Acute infection of nasal sinus 05/04/2021   Aortic atherosclerosis 01/30/2021   Asthma    daily inhalers   Ataxia 10/18/2020   Bilateral carpal tunnel syndrome 01/30/2021   Bipolar disorder, mixed 06/16/2021   Breast cancer of upper-inner quadrant of left female breast 04/15/2015   Carotid artery disease 05/04/2016   Mild, 1-39% bilaterally by doppler in March 2018   Chronic pain in right shoulder 12/25/2020   COPD with acute exacerbation 07/03/2016   Dental crowns present    Drug induced constipation 04/10/2021   EIC (epidermal inclusion cyst) 03/21/2021   Family history of breast cancer in female 04/26/2015   Dx. Niece at 7; dx. Maternal aunt in her late 53s  Formatting of this note might be different from the original. Overview:  Dx. Niece at 57; dx. Maternal aunt in her late 76s   Family history of colon cancer 04/26/2015   Dx. In maternal grandfather in his late 76s-70  Formatting of this  note might be different from the original. Overview:  Dx. In maternal grandfather in his late 41s-70   Frequent falls 10/18/2020   Genetic testing 06/02/2015   Negative for pathogenic mutations within any of 20 genes on the Breast/Ovarian Cancer Panel through [employer].  No variants of uncertain significance (VUSes) were found.  The Breast/Ovarian Cancer Panel offered by [facility] Basilio Cairo, MD) includes sequencing and deletion/duplication analysis for the following 19 genes:  ATM, BARD1, BRCA1, BRCA2, BRIP1, CDH1, CHEK2, FANCC, M   History of basal cell carcinoma 04/26/2015   History of concussion 01/01/2014   History of kidney stones    History of TIA (transient ischemic attack) 10/2014   Hyperlipidemia    Insomnia    Lumbar back pain 10/18/2020   Major depressive disorder 10/18/2020   Malrotation of intestine 09/23/2017   Migraine aura without headache 02/08/2014   Migraine headache 01/30/2021   Nasal congestion 05/26/2015   will finish z-pak 05/27/2015   Nonproductive cough 05/26/2015   PONV (postoperative nausea and vomiting)    "long time ago"  none recently   Psychogenic syncope 04/23/2019   Right lumbar radiculopathy 04/23/2019   RLS (restless legs syndrome)    Simple chronic bronchitis 07/04/2019   Tobacco use    Tremor 10/18/2020   Vitamin D deficiency    Wears dentures    Weight loss 08/28/2019    Past Surgical History:  Procedure Laterality Date   ABDOMINAL HYSTERECTOMY     APPENDECTOMY  2000   BREAST BIOPSY Left    BREAST RECONSTRUCTION WITH PLACEMENT OF TISSUE EXPANDER AND FLEX HD (ACELLULAR HYDRATED DERMIS) Bilateral 06/02/2015   Procedure: BILATERAL BREAST RECONSTRUCTION WITH PLACEMENT OF TISSUE EXPANDER AND  ACELLULAR HYDRATED DERMIS;  Surgeon: Glenna Fellows, MD;  Location: [facility];  Service: Plastics;  Laterality: Bilateral;   CARPAL TUNNEL RELEASE Bilateral    CATARACT EXTRACTION Bilateral 09/2013   KNEE ARTHROSCOPY  Left    LAPAROSCOPIC ABDOMINAL EXPLORATION N/A 10/2017   LAPAROSCOPIC LYSIS OF CONGENITAL ADHESIVE BAND/LADD'S BANDS WIH INTRAOPERATIVE CHOLANGIOGRAPHY. Perforation of small intestion requiring repair.    LOOP RECORDER INSERTION N/A 05/11/2016   Procedure: Loop Recorder Insertion;  Surgeon: Will Jorja Loa, MD;  Location: [facility];  Service: Cardiovascular;  Laterality: N/A;   MASTECTOMY W/ SENTINEL NODE BIOPSY Bilateral 06/02/2015   Procedure: BILATERAL MASTECTOMY WITH LEFT SENTINEL LYMPH NODE BIOPSY;  Surgeon: Almond Lint, MD;  Location: [facility];  Service: General;  Laterality: Bilateral;   NASAL SEPTUM SURGERY     x 3   PLANTAR FASCIA SURGERY Left    REMOVAL OF BILATERAL TISSUE EXPANDERS WITH PLACEMENT OF BILATERAL BREAST IMPLANTS Bilateral 06/17/2015   Procedure: DEBRIDEMENT OF BILATERAL MASTECTOMY FLAP WITH BILATERAL TISSUE EXPANDER EXCHANGE ;  Surgeon: Glenna Fellows, MD;  Location: [facility];  Service: Plastics;  Laterality: Bilateral;   REMOVAL OF TISSUE EXPANDER Bilateral 07/12/2015   Procedure: REMOVAL OF BILATERAL TISSUE EXPANDERS;  Surgeon: Glenna Fellows, MD;  Location: [facility];  Service: Plastics;  Laterality: Bilateral;    Family History  Problem Relation Age of Onset   Cervical cancer Mother 70   Colon cancer Maternal Grandfather        mets to stomach; dx. 67-70   Heart disease Father    Prostate cancer Father 82   Cervical cancer Maternal Grandmother        dx. 83s; treated with radium implant   Lung cancer Sister 71       maternal half-sister dx. lung cancer, stage III   Breast cancer Maternal Aunt    Cervical cancer Sister 8       paternal half-sister; s/p TAH   Spina bifida Grandchild    Brain cancer Grandchild        grandson dx. at 20 mos, treated at [facility]   Breast cancer Other 51       maternal half-sister's daughter   Cancer Maternal Aunt        d. 48; unspecified type - "began at back area and moved to vital  organs"   Cancer Maternal Uncle         late 51s; unspecified type; "started at back and moved to vital organs"    Social History   Socioeconomic History   Marital status: Widowed    Spouse name: Not on file   Number of children: 1   Years of education: 35   Highest education level: Not on file  Occupational History    Comment: [employer], retired  Tobacco Use   Smoking status: Every Day    Current packs/day: 0.25    Types: Cigarettes   Smokeless tobacco: Never   Tobacco comments:    Currently wearing nicotine patches  Vaping Use   Vaping status: Never Used  Substance and Sexual Activity   Alcohol use: Never   Drug use: No   Sexual activity: Not Currently  Other Topics Concern   Not on file  Social History Narrative   Lives alone, widow   Caffeine use- tea, 1 cup daily   Right Handed    Lives in a one story [place] home    Social Determinants of Health   Financial Resource Strain: Low Risk  (02/17/2021)   Overall Financial Resource Strain (CARDIA)    Difficulty of Paying Living Expenses: Not hard at all  Food Insecurity: Food Insecurity Present (03/08/2022)   Hunger Vital Sign    Worried About Running Out of Food in the Last Year: Never true    Ran Out of Food in the Last Year: Sometimes true  Transportation Needs: No Transportation Needs (03/08/2022)   PRAPARE - Administrator, Civil Service (Medical): No    Lack of Transportation (Non-Medical): No  Physical Activity: Inactive (03/08/2022)   Exercise Vital Sign    Days of Exercise per Week: 0 days    Minutes of Exercise per Session: 0 min  Stress: Stress Concern Present (03/08/2022)   [employer] of Occupational Health - Occupational Stress Questionnaire    Feeling of Stress : To some extent  Social Connections: Socially Isolated (05/17/2022)   Social Connection and Isolation Panel [NHANES]    Frequency of Communication with Friends and Family: Twice a week    Frequency of Social Gatherings  with Friends and Family: Twice a week    Attends Religious Services: Never    Database administrator or Organizations: No    Attends Banker Meetings: Never    Marital Status: Divorced  Catering manager Violence: Not At Risk (05/17/2022)   Humiliation, Afraid, Rape, and Kick questionnaire    Fear of Current or Ex-Partner: No    Emotionally Abused: No    Physically Abused: No    Sexually Abused: No    Outpatient Medications Prior to Visit  Medication Sig Dispense Refill   albuterol (PROVENTIL) (2.5 MG/3ML) 0.083% nebulizer solution Take 2.5 mg by nebulization 4 (four) times daily.     albuterol (VENTOLIN HFA) 108 (90 Base) MCG/ACT inhaler INHALE 1-2 PUFFS BY MOUTH into THE lungs every SIX hours AS NEEDED FOR WHEEZING AND/OR SHORTNESS OF BREATH 8.5 g 2   apixaban (ELIQUIS) 5 MG TABS tablet Take 5 mg by mouth.     Atogepant ([name]) 60 MG TABS Take 1 tablet (60 mg total) by mouth daily. 90 tablet 3   atorvastatin (LIPITOR) 10 MG tablet TAKE 1 TABLET EVERY DAY 90 tablet 1   Budeson-Glycopyrrol-Formoterol (BREZTRI AEROSPHERE) 160-9-4.8 MCG/ACT AERO Inhale 2 puffs into the lungs 2 (two) times daily. 3 each 3   Cyanocobalamin (B-12 COMPLIANCE INJECTION) 1000 MCG/ML KIT Inject 1 mL as directed every 30 (thirty) days.     HYDROcodone-acetaminophen (NORCO/VICODIN) 5-325 MG tablet TAKE ONE TABLET BY MOUTH daily AS NEEDED FOR SEVERE pain (migranes) 30 tablet 0   megestrol (MEGACE) 20 MG tablet TAKE 2 TABLETS EVERY DAY 180 tablet 3   melatonin 10 MG TABS Take 20 mg by mouth at bedtime. 1 tablet 0   meloxicam (MOBIC) 15 MG tablet TAKE 1 TABLET EVERY DAY 90 tablet 0   midodrine (PROAMATINE) 5 MG tablet Take 1 tablet (5 mg total) by mouth 3 (three) times daily with meals. 90 tablet 1   mirtazapine (REMERON) 45 MG tablet TAKE 1 TABLET AT BEDTIME 90 tablet 3   montelukast (SINGULAIR) 10 MG tablet TAKE 1 TABLET EVERY DAY 90 tablet 3   omeprazole (PRILOSEC) 40 MG capsule  TAKE 1 CAPSULE TWICE  DAILY 180 capsule 3   rizatriptan (MAXALT-MLT) 10 MG disintegrating tablet Take 1 tablet at onset of headache. May repeat 1 dose in 2 hours if needed. Max 2 tabs in 24 hours. 10 tablet 2   SYRINGE-NEEDLE, DISP, 3 ML (LUER LOCK SAFETY SYRINGES) 25G X 1" 3 ML MISC Inject 1 each into the muscle every 30 (thirty) days. 50 each 0   ALPRAZolam (XANAX) 0.25 MG tablet Take 1 tablet (0.25 mg total) by mouth 2 (two) times daily as needed for anxiety. 60 tablet 0   amitriptyline (ELAVIL) 25 MG tablet Take 1 tablet (25 mg total) by mouth at bedtime. 90 tablet 0   gabapentin (NEURONTIN) 300 MG capsule TAKE TWO CAPSULES BY MOUTH EVERY MORNING and TAKE TWO CAPSULES BY MOUTH AT NOON and TAKE TWO CAPSULES BY MOUTH EVERYDAY AT BEDTIME 180 capsule 2   primidone (MYSOLINE) 50 MG tablet Take 0.5 tablets (25 mg total) by mouth at bedtime. 15 tablet 0   sertraline (ZOLOFT) 100 MG tablet Take 1 tablet (100 mg total) by mouth daily. (Patient not taking: Reported on 08/17/2022) 90 tablet 1   sertraline (ZOLOFT) 50 MG tablet TAKE 1 TABLET EVERY DAY (Patient not taking: Reported on 08/17/2022) 90 tablet 3   traZODone (DESYREL) 100 MG tablet TAKE 1 TABLET AT BEDTIME AS NEEDED FOR SLEEP 90 tablet 3   Vitamin D, Ergocalciferol, (DRISDOL) 1.25 MG (50000 UNIT) CAPS capsule TAKE 1 CAPSULE EVERY 7 DAYS. (Patient not taking: Reported on 08/17/2022) 12 capsule 1   Facility-Administered Medications Prior to Visit  Medication Dose Route Frequency Provider Last Rate Last Admin   ipratropium-albuterol (DUONEB) 0.5-2.5 (3) MG/3ML nebulizer solution 3 mL  3 mL Nebulization Once         Allergies  Allergen Reactions   Clindamycin/Lincomycin Nausea And Vomiting   Lyrica [Pregabalin] Other (See Comments)    PERSONALITY CHANGE    Review of Systems  Constitutional:  Negative for chills, fatigue and fever.  HENT:  Negative for congestion, ear pain, rhinorrhea and sore throat.   Respiratory:  Positive for cough. Negative for shortness of  breath.   Cardiovascular:  Negative for chest pain.  Gastrointestinal:  Negative for abdominal pain, constipation, diarrhea, nausea and vomiting.  Genitourinary:  Negative for dysuria and urgency.  Musculoskeletal:  Negative for back pain and myalgias.  Neurological:  Positive for dizziness. Negative for weakness, light-headedness and headaches.       Objective:        10/23/2022    9:45 AM 10/04/2022    2:45 PM 09/19/2022    8:45 AM  Vitals with BMI  Height 5\' 0"  5\' 0"  5\' 1"   Weight 89 lbs 85 lbs 3 oz 84 lbs  BMI 17.38 16.64 15.88  Systolic 112 110 161  Diastolic 60 60 60  Pulse 96 116 88    No data found.   Physical Exam Vitals reviewed.  Constitutional:      Appearance: Normal appearance.     Comments: cachectic  Neck:     Vascular: No carotid bruit.  Cardiovascular:     Rate and Rhythm: Normal rate and regular rhythm.     Heart sounds: Normal heart sounds.  Pulmonary:     Effort: Pulmonary effort is normal. No respiratory distress.     Breath sounds: Normal breath sounds.  Abdominal:     General: Abdomen is flat. Bowel sounds are normal.     Palpations: Abdomen is soft.     Tenderness:  There is no abdominal tenderness.  Neurological:     Mental Status: She is alert and oriented to person, place, and time.     Gait: Gait abnormal.     Comments: Positive rhombergs Positive dysmetria.  Psychiatric:        Mood and Affect: Mood normal.        Behavior: Behavior normal.           Health Maintenance Due  Topic Date Due   Hepatitis C Screening  Never done   COVID-19 Vaccine (3 - Moderna risk series) 11/26/2021   Zoster Vaccines- Shingrix (2 of 2) 04/02/2022   Lung Cancer Screening  05/30/2022   INFLUENZA VACCINE  10/04/2022    There are no preventive care reminders to display for this patient.   Lab Results  Component Value Date   TSH 2.560 10/23/2022   Lab Results  Component Value Date   WBC 8.2 10/23/2022   HGB 10.7 (L) 10/23/2022   HCT 34.8  10/23/2022   MCV 92 10/23/2022   PLT 272 10/23/2022   Lab Results  Component Value Date   NA 145 (H) 10/23/2022   K 4.5 10/23/2022   CO2 19 (L) 10/23/2022   GLUCOSE 91 10/23/2022   BUN 20 10/23/2022   CREATININE 0.84 10/23/2022   BILITOT <0.2 10/23/2022   ALKPHOS 82 10/23/2022   AST 16 10/23/2022   ALT 10 10/23/2022   PROT 5.9 (L) 10/23/2022   ALBUMIN 4.1 10/23/2022   CALCIUM 9.3 10/23/2022   ANIONGAP 3 (L) 07/03/2016   EGFR 72 10/23/2022   Lab Results  Component Value Date   CHOL 171 09/19/2022   Lab Results  Component Value Date   HDL 49 09/19/2022   Lab Results  Component Value Date   LDLCALC 104 (H) 09/19/2022   Lab Results  Component Value Date   TRIG 100 09/19/2022   Lab Results  Component Value Date   CHOLHDL 3.5 09/19/2022   No results found for: "HGBA1C"     Assessment & Plan:  Memory loss Assessment & Plan: Order mri of brain  Orders: -     MR BRAIN WO CONTRAST; Future  Migraine aura without headache Assessment & Plan: The current medical regimen is effective;  continue present plan and medications.    Vitamin B12 deficiency Assessment & Plan: Check labs.  Orders: -     Vitamin B12  Ataxia Assessment & Plan: Discontinue Xanax Discontinue amitriptyline Discontinue primidone. Decrease gabapentin to 300 mg one three times a day. Decrease trazodone to 50 mg once daily.   Order mri of brain Referring physical therapist Check labs  Orders: -     CBC with Differential/Platelet -     Comprehensive metabolic panel -     TSH -     Ambulatory referral to Physical Therapy  Severe protein-calorie malnutrition Lily Kocher: less than 60% of standard weight) (HCC) Assessment & Plan: Strongly recommended patient eat 3 meals a day.  Also recommend protein shakes 2-3 times per day.  Orders: -     Amb ref to Medical Nutrition Therapy-MNT  Vitamin D deficiency Assessment & Plan: restart vitamin D 50,000 once weekly.  Check  labs  Orders: -     Vitamin D (Ergocalciferol); Take 1 capsule (50,000 Units total) by mouth every 7 (seven) days.  Dispense: 12 capsule; Refill: 1  Severe episode of recurrent major depressive disorder, without psychotic features [facility]) Assessment & Plan: The current medical regimen is effective;  continue present plan and  medications.   Orders: -     Sertraline HCl; Take 1 tablet (100 mg total) by mouth daily.  Idiopathic hypotension Assessment & Plan: Discontinue Xanax Discontinue amitriptyline Discontinue primidone. Decrease gabapentin to 300 mg one three times a day. Decrease trazodone to 50 mg once daily.     Other orders -     Gabapentin; Take 1 capsule (300 mg total) by mouth 3 (three) times daily. -     traZODone HCl; Take 0.5 tablets (50 mg total) by mouth at bedtime as needed. for sleep     Meds ordered this encounter  Medications   gabapentin (NEURONTIN) 300 MG capsule    Sig: Take 1 capsule (300 mg total) by mouth 3 (three) times daily.    This prescription was filled on 05/04/2022. Any refills authorized will be placed on file.   Vitamin D, Ergocalciferol, (DRISDOL) 1.25 MG (50000 UNIT) CAPS capsule    Sig: Take 1 capsule (50,000 Units total) by mouth every 7 (seven) days.    Dispense:  12 capsule    Refill:  1   sertraline (ZOLOFT) 100 MG tablet    Sig: Take 1 tablet (100 mg total) by mouth daily.    This prescription was filled on 12/01/2021. Any refills authorized will be placed on file.   traZODone (DESYREL) 100 MG tablet    Sig: Take 0.5 tablets (50 mg total) by mouth at bedtime as needed. for sleep    Orders Placed This Encounter  Procedures   MR Brain Wo Contrast   CBC with Differential/Platelet   Comprehensive metabolic panel   Vitamin B12   TSH   Amb ref to Medical Nutrition Therapy-MNT   Ambulatory referral to Physical Therapy     Follow-up: Return in about 2 weeks (around 11/06/2022) for chronic follow up.  An After Visit Summary was printed  and given to the patient.   Clayborn Bigness I Leal-Borjas,acting as a scribe for Blane Ohara, MD.,have documented all relevant documentation on the behalf of Blane Ohara, MD,as directed by  Blane Ohara, MD while in the presence of Blane Ohara, MD.   Blane Ohara, MD [facility] [phone]

## 2022-10-24 LAB — CBC WITH DIFFERENTIAL/PLATELET
Basophils Absolute: 0.1 10*3/uL (ref 0.0–0.2)
Basos: 1 %
EOS (ABSOLUTE): 0.2 10*3/uL (ref 0.0–0.4)
Eos: 2 %
Hematocrit: 34.8 % (ref 34.0–46.6)
Hemoglobin: 10.7 g/dL — ABNORMAL LOW (ref 11.1–15.9)
Immature Grans (Abs): 0 10*3/uL (ref 0.0–0.1)
Immature Granulocytes: 1 %
Lymphocytes Absolute: 1.9 10*3/uL (ref 0.7–3.1)
Lymphs: 23 %
MCH: 28.3 pg (ref 26.6–33.0)
MCHC: 30.7 g/dL — ABNORMAL LOW (ref 31.5–35.7)
MCV: 92 fL (ref 79–97)
Monocytes Absolute: 0.8 10*3/uL (ref 0.1–0.9)
Monocytes: 10 %
Neutrophils Absolute: 5.2 10*3/uL (ref 1.4–7.0)
Neutrophils: 63 %
Platelets: 272 10*3/uL (ref 150–450)
RBC: 3.78 x10E6/uL (ref 3.77–5.28)
RDW: 13 % (ref 11.7–15.4)
WBC: 8.2 10*3/uL (ref 3.4–10.8)

## 2022-10-24 LAB — COMPREHENSIVE METABOLIC PANEL
ALT: 10 IU/L (ref 0–32)
AST: 16 IU/L (ref 0–40)
Albumin: 4.1 g/dL (ref 3.8–4.8)
Alkaline Phosphatase: 82 IU/L (ref 44–121)
BUN/Creatinine Ratio: 24 (ref 12–28)
BUN: 20 mg/dL (ref 8–27)
Bilirubin Total: <0.2 mg/dL (ref 0.0–1.2)
CO2: 19 mmol/L — ABNORMAL LOW (ref 20–29)
Calcium: 9.3 mg/dL (ref 8.7–10.3)
Chloride: 110 mmol/L — ABNORMAL HIGH (ref 96–106)
Creatinine, Ser: 0.84 mg/dL (ref 0.57–1.00)
Globulin, Total: 1.8 g/dL (ref 1.5–4.5)
Glucose: 91 mg/dL (ref 70–99)
Potassium: 4.5 mmol/L (ref 3.5–5.2)
Sodium: 145 mmol/L — ABNORMAL HIGH (ref 134–144)
Total Protein: 5.9 g/dL — ABNORMAL LOW (ref 6.0–8.5)
eGFR: 72 mL/min/{1.73_m2} (ref >59)

## 2022-10-24 LAB — TSH: TSH: 2.56 u[IU]/mL (ref 0.450–4.500)

## 2022-10-24 LAB — VITAMIN B12: Vitamin B-12: 373 pg/mL (ref 232–1245)

## 2022-10-26 NOTE — Assessment & Plan Note (Signed)
Discontinue Xanax Discontinue amitriptyline Discontinue primidone. Decrease gabapentin to 300 mg one three times a day. Decrease trazodone to 50 mg once daily.

## 2022-10-26 NOTE — Assessment & Plan Note (Addendum)
restart vitamin D 50,000 once weekly.  Check labs

## 2022-10-26 NOTE — Assessment & Plan Note (Signed)
 Strongly recommended patient eat 3 meals a day.  Also recommend protein shakes 2-3 times per day.

## 2022-10-26 NOTE — Assessment & Plan Note (Signed)
The current medical regimen is effective;  continue present plan and medications.  

## 2022-10-26 NOTE — Assessment & Plan Note (Signed)
Order mri of brain

## 2022-10-26 NOTE — Assessment & Plan Note (Addendum)
Discontinue Xanax Discontinue amitriptyline Discontinue primidone. Decrease gabapentin to 300 mg one three times a day. Decrease trazodone to 50 mg once daily.   Order mri of brain Referring physical therapist Check labs

## 2022-10-26 NOTE — Telephone Encounter (Signed)
 PAP: Application for ELIQUIS has been submitted to PAP Companies: [employer], via fax    PLEASE BE ADVISED

## 2022-10-26 NOTE — Assessment & Plan Note (Signed)
Check labs 

## 2022-10-29 ENCOUNTER — Encounter: Payer: Self-pay | Admitting: Family Medicine

## 2022-10-30 ENCOUNTER — Telehealth: Payer: Self-pay | Admitting: Family Medicine

## 2022-10-30 NOTE — Addendum Note (Signed)
Addended byBlane Ohara on: 10/30/2022 09:01 PM   Modules accepted: Level of Service

## 2022-10-30 NOTE — Telephone Encounter (Signed)
   Anne [name] has been scheduled for the following appointment:  WHAT: BRAIN MRI WHERE: [facility] DATE: 11/01/22 TIME: 12:45 PM CHECK-IN  Patient has been made aware.

## 2022-11-01 ENCOUNTER — Ambulatory Visit: Payer: Medicare HMO | Admitting: Family Medicine

## 2022-11-01 DIAGNOSIS — J341 Cyst and mucocele of nose and nasal sinus: Secondary | ICD-10-CM | POA: Diagnosis not present

## 2022-11-01 DIAGNOSIS — R4182 Altered mental status, unspecified: Secondary | ICD-10-CM | POA: Diagnosis not present

## 2022-11-01 DIAGNOSIS — R413 Other amnesia: Secondary | ICD-10-CM | POA: Diagnosis not present

## 2022-11-01 DIAGNOSIS — J329 Chronic sinusitis, unspecified: Secondary | ICD-10-CM | POA: Diagnosis not present

## 2022-11-01 DIAGNOSIS — G319 Degenerative disease of nervous system, unspecified: Secondary | ICD-10-CM | POA: Diagnosis not present

## 2022-11-03 NOTE — Addendum Note (Signed)
Addended byBlane Ohara on: 11/03/2022 08:27 PM   Modules accepted: Level of Service

## 2022-11-04 ENCOUNTER — Other Ambulatory Visit: Payer: Self-pay | Admitting: Family Medicine

## 2022-11-04 DIAGNOSIS — F332 Major depressive disorder, recurrent severe without psychotic features: Secondary | ICD-10-CM

## 2022-11-04 DIAGNOSIS — J449 Chronic obstructive pulmonary disease, unspecified: Secondary | ICD-10-CM | POA: Diagnosis not present

## 2022-11-06 ENCOUNTER — Encounter: Payer: Self-pay | Admitting: Family Medicine

## 2022-11-06 ENCOUNTER — Ambulatory Visit: Payer: Medicare PPO | Admitting: Family Medicine

## 2022-11-09 ENCOUNTER — Ambulatory Visit (INDEPENDENT_AMBULATORY_CARE_PROVIDER_SITE_OTHER): Payer: Medicare PPO

## 2022-11-09 DIAGNOSIS — Z23 Encounter for immunization: Secondary | ICD-10-CM

## 2022-11-09 NOTE — Progress Notes (Signed)
Patient came in wanting the results for her labs, and her MRI. Patient also had some concern of some red patch- like spots on her arm.  Dine Jocabs,FNP: Made patient aware that the red spot on her arms were purpura, and it comes from her taking blood thinner.  Results were given to patient, Patient verbalized Understanding. Also was given fluad vaccine, tolerated vaccine well.

## 2022-11-23 ENCOUNTER — Ambulatory Visit: Payer: Medicare PPO | Admitting: Family Medicine

## 2022-11-26 ENCOUNTER — Encounter: Payer: Self-pay | Admitting: Family Medicine

## 2022-11-26 ENCOUNTER — Ambulatory Visit (INDEPENDENT_AMBULATORY_CARE_PROVIDER_SITE_OTHER): Payer: Medicare PPO | Admitting: Family Medicine

## 2022-11-26 VITALS — BP 118/64 | HR 88 | Temp 97.0°F | Resp 18 | Ht 60.0 in | Wt 92.0 lb

## 2022-11-26 DIAGNOSIS — G479 Sleep disorder, unspecified: Secondary | ICD-10-CM | POA: Diagnosis not present

## 2022-11-26 DIAGNOSIS — M5416 Radiculopathy, lumbar region: Secondary | ICD-10-CM | POA: Diagnosis not present

## 2022-11-26 DIAGNOSIS — R634 Abnormal weight loss: Secondary | ICD-10-CM | POA: Diagnosis not present

## 2022-11-26 DIAGNOSIS — F03B3 Unspecified dementia, moderate, with mood disturbance: Secondary | ICD-10-CM

## 2022-11-26 DIAGNOSIS — E43 Unspecified severe protein-calorie malnutrition: Secondary | ICD-10-CM | POA: Diagnosis not present

## 2022-11-26 DIAGNOSIS — I6521 Occlusion and stenosis of right carotid artery: Secondary | ICD-10-CM

## 2022-11-26 DIAGNOSIS — I2699 Other pulmonary embolism without acute cor pulmonale: Secondary | ICD-10-CM | POA: Diagnosis not present

## 2022-11-26 DIAGNOSIS — G43109 Migraine with aura, not intractable, without status migrainosus: Secondary | ICD-10-CM | POA: Diagnosis not present

## 2022-11-26 MED ORDER — DONEPEZIL HCL 5 MG PO TABS: 5.0000 mg | ORAL_TABLET | Freq: Every day | ORAL | 0 refills | Status: DC

## 2022-11-26 MED ORDER — PROMETHAZINE HCL 25 MG PO TABS: 25.0000 mg | ORAL_TABLET | Freq: Three times a day (TID) | ORAL | 0 refills | Status: DC | PRN

## 2022-11-26 MED ORDER — HYDROCODONE-ACETAMINOPHEN 5-325 MG PO TABS
ORAL_TABLET | ORAL | 0 refills | Status: DC
Start: 2022-11-26 — End: 2023-04-01

## 2022-11-26 NOTE — Progress Notes (Signed)
Subjective:  Patient ID: Anne [name], female    DOB: [birth year]  Age: 77 y.o. MRN: [id number]  Chief Complaint  Patient presents with   Memory Loss    HPI 2 week follow up memory loss.  At her most recent visit I discontinued Xanax, amitriptyline, permitting and decreased gabapentin to 300 mg 1 p.o. 3 times daily and decreased trazodone to 50 mg 1 p.o. nightly.  I also restarted vitamin D 50,000 units once weekly. MRI: mild decreased size of brain unchanged from last mri. No new source of issues.  Patient has memory issues.  Patient has had worsening insomnia since discontinuing medications above.  It is unclear if this has helped her memory at all.  Patient has a history of pulmonary embolism and is supposed to be on Eliquis for 6 months.  Her pulmonary embolism occurred in May 2024.  She is no longer taking the Eliquis.  The cost is too great.  She ran out 2 weeks ago despite me telling her that she should call at her previous appt, when she had ran out 4 weeks ago. PAP was filled out for eliquis. Per pharmacist she has still not completed her portion. I found this out after patient left.      11/26/2022    3:42 PM 10/23/2022    9:52 AM 10/04/2022    2:50 PM 09/19/2022    8:49 AM 03/08/2022    2:48 PM  Depression screen PHQ 2/9  Decreased Interest 1 2 3 1 1   Down, Depressed, Hopeless 1 2 3 1 1   PHQ - 2 Score 2 4 6 2 2   Altered sleeping 3 3 3 3 1   Tired, decreased energy 3 2 3 2 1   Change in appetite 3 3 3   0  Feeling bad or failure about yourself  0 0 0 0 0  Trouble concentrating 1 1 2 1  0  Moving slowly or fidgety/restless 1 1 2  0 0  Suicidal thoughts  0 0 0 0  PHQ-9 Score 13 14 19 8 4   Difficult doing work/chores Somewhat difficult Somewhat difficult Somewhat difficult Somewhat difficult Not difficult at all       11/26/2022    3:44 PM 10/23/2022    9:52 AM 10/04/2022    2:51 PM 09/19/2022    8:49 AM  GAD 7 : Generalized Anxiety Score  Nervous, Anxious, on Edge 1 2 2 2    Control/stop worrying 1 1 3 2   Worry too much - different things 1 1 3  0  Trouble relaxing 1 3 3 3   Restless 1 1 0 1  Easily annoyed or irritable 0 1 2 1   Afraid - awful might happen 2 0 0 0  Total GAD 7 Score 7 9 13 9   Anxiety Difficulty Somewhat difficult Somewhat difficult Somewhat difficult Somewhat difficult      Patient Care Team: Blane Ohara, MD as PCP - General (Internal Medicine) Glendale Chard, DO as Consulting Physician (Neurology) Marcellus Scott, MD as Referring Physician (Specialist)   Review of Systems  Constitutional:  Negative for chills, fatigue and fever.  HENT:  Negative for congestion, ear pain, rhinorrhea and sore throat.   Respiratory:  Negative for cough and shortness of breath.   Cardiovascular:  Negative for chest pain.  Gastrointestinal:  Negative for abdominal pain, constipation, diarrhea, nausea and vomiting.  Genitourinary:  Negative for dysuria and urgency.  Musculoskeletal:  Positive for back pain. Negative for myalgias.  Neurological:  Negative for dizziness, weakness,  light-headedness and headaches.  Psychiatric/Behavioral:  Positive for dysphoric mood and sleep disturbance. The patient is not nervous/anxious.     Current Outpatient Medications on File Prior to Visit  Medication Sig Dispense Refill   albuterol (PROVENTIL) (2.5 MG/3ML) 0.083% nebulizer solution Take 2.5 mg by nebulization 4 (four) times daily.     albuterol (VENTOLIN HFA) 108 (90 Base) MCG/ACT inhaler INHALE 1-2 PUFFS BY MOUTH into THE lungs every SIX hours AS NEEDED FOR WHEEZING AND/OR SHORTNESS OF BREATH 8.5 g 2   amitriptyline (ELAVIL) 25 MG tablet TAKE 1 TABLET AT BEDTIME 90 tablet 1   atorvastatin (LIPITOR) 10 MG tablet TAKE 1 TABLET EVERY DAY 90 tablet 1   Budeson-Glycopyrrol-Formoterol (BREZTRI AEROSPHERE) 160-9-4.8 MCG/ACT AERO Inhale 2 puffs into the lungs 2 (two) times daily. 3 each 3   Cyanocobalamin (B-12 COMPLIANCE INJECTION) 1000 MCG/ML KIT Inject 1 mL as directed  every 30 (thirty) days.     gabapentin (NEURONTIN) 300 MG capsule Take 1 capsule (300 mg total) by mouth 3 (three) times daily.     megestrol (MEGACE) 20 MG tablet TAKE 2 TABLETS EVERY DAY 180 tablet 3   melatonin 10 MG TABS Take 20 mg by mouth at bedtime. 1 tablet 0   meloxicam (MOBIC) 15 MG tablet TAKE 1 TABLET EVERY DAY 90 tablet 0   midodrine (PROAMATINE) 5 MG tablet Take 1 tablet (5 mg total) by mouth 3 (three) times daily with meals. 90 tablet 1   mirtazapine (REMERON) 45 MG tablet TAKE 1 TABLET AT BEDTIME 90 tablet 3   montelukast (SINGULAIR) 10 MG tablet TAKE 1 TABLET EVERY DAY 90 tablet 3   omeprazole (PRILOSEC) 40 MG capsule TAKE 1 CAPSULE TWICE DAILY 180 capsule 3   rizatriptan (MAXALT-MLT) 10 MG disintegrating tablet Take 1 tablet at onset of headache. May repeat 1 dose in 2 hours if needed. Max 2 tabs in 24 hours. 10 tablet 2   sertraline (ZOLOFT) 100 MG tablet TAKE 1 TABLET EVERY DAY 90 tablet 1   SYRINGE-NEEDLE, DISP, 3 ML (LUER LOCK SAFETY SYRINGES) 25G X 1" 3 ML MISC Inject 1 each into the muscle every 30 (thirty) days. 50 each 0   traZODone (DESYREL) 100 MG tablet Take 0.5 tablets (50 mg total) by mouth at bedtime as needed. for sleep     Vitamin D, Ergocalciferol, (DRISDOL) 1.25 MG (50000 UNIT) CAPS capsule Take 1 capsule (50,000 Units total) by mouth every 7 (seven) days. 12 capsule 1   Current Facility-Administered Medications on File Prior to Visit  Medication Dose Route Frequency Provider Last Rate Last Admin   ipratropium-albuterol (DUONEB) 0.5-2.5 (3) MG/3ML nebulizer solution 3 mL  3 mL Nebulization Once        Past Medical History:  Diagnosis Date   Acquired absence of both breasts and nipples 06/08/2015   Acute infection of nasal sinus 05/04/2021   Aortic atherosclerosis 01/30/2021   Asthma    daily inhalers   Ataxia 10/18/2020   Bilateral carpal tunnel syndrome 01/30/2021   Bipolar disorder, mixed 06/16/2021   Breast cancer of upper-inner quadrant of left  female breast 04/15/2015   Carotid artery disease 05/04/2016   Mild, 1-39% bilaterally by doppler in March 2018   Chronic pain in right shoulder 12/25/2020   COPD with acute exacerbation 07/03/2016   Dental crowns present    Drug induced constipation 04/10/2021   EIC (epidermal inclusion cyst) 03/21/2021   Family history of breast cancer in female 04/26/2015   Dx. Niece at 66; dx. Maternal  aunt in her late 43s  Formatting of this note might be different from the original. Overview:  Dx. Niece at 85; dx. Maternal aunt in her late 79s   Family history of colon cancer 04/26/2015   Dx. In maternal grandfather in his late 40s-70  Formatting of this note might be different from the original. Overview:  Dx. In maternal grandfather in his late 84s-70   Frequent falls 10/18/2020   Genetic testing 06/02/2015   Negative for pathogenic mutations within any of 20 genes on the Breast/Ovarian Cancer Panel through [employer].  No variants of uncertain significance (VUSes) were found.  The Breast/Ovarian Cancer Panel offered by [facility] Basilio Cairo, MD) includes sequencing and deletion/duplication analysis for the following 19 genes:  ATM, BARD1, BRCA1, BRCA2, BRIP1, CDH1, CHEK2, FANCC, M   History of basal cell carcinoma 04/26/2015   History of concussion 01/01/2014   History of kidney stones    History of TIA (transient ischemic attack) 10/2014   Hyperlipidemia    Insomnia    Lumbar back pain 10/18/2020   Major depressive disorder 10/18/2020   Malrotation of intestine 09/23/2017   Migraine aura without headache 02/08/2014   Migraine headache 01/30/2021   Nasal congestion 05/26/2015   will finish z-pak 05/27/2015   Nonproductive cough 05/26/2015   PONV (postoperative nausea and vomiting)    "long time ago"  none recently   Psychogenic syncope 04/23/2019   Right lumbar radiculopathy 04/23/2019   RLS (restless legs syndrome)    Simple chronic bronchitis 07/04/2019   Tobacco use     Tremor 10/18/2020   Vitamin D deficiency    Wears dentures    Weight loss 08/28/2019   Past Surgical History:  Procedure Laterality Date   ABDOMINAL HYSTERECTOMY     APPENDECTOMY  2000   BREAST BIOPSY Left    BREAST RECONSTRUCTION WITH PLACEMENT OF TISSUE EXPANDER AND FLEX HD (ACELLULAR HYDRATED DERMIS) Bilateral 06/02/2015   Procedure: BILATERAL BREAST RECONSTRUCTION WITH PLACEMENT OF TISSUE EXPANDER AND  ACELLULAR HYDRATED DERMIS;  Surgeon: Glenna Fellows, MD;  Location: [facility];  Service: Plastics;  Laterality: Bilateral;   CARPAL TUNNEL RELEASE Bilateral    CATARACT EXTRACTION Bilateral 09/2013   KNEE ARTHROSCOPY Left    LAPAROSCOPIC ABDOMINAL EXPLORATION N/A 10/2017   LAPAROSCOPIC LYSIS OF CONGENITAL ADHESIVE BAND/LADD'S BANDS WIH INTRAOPERATIVE CHOLANGIOGRAPHY. Perforation of small intestion requiring repair.    LOOP RECORDER INSERTION N/A 05/11/2016   Procedure: Loop Recorder Insertion;  Surgeon: Will Jorja Loa, MD;  Location: [facility];  Service: Cardiovascular;  Laterality: N/A;   MASTECTOMY W/ SENTINEL NODE BIOPSY Bilateral 06/02/2015   Procedure: BILATERAL MASTECTOMY WITH LEFT SENTINEL LYMPH NODE BIOPSY;  Surgeon: Almond Lint, MD;  Location: [facility];  Service: General;  Laterality: Bilateral;   NASAL SEPTUM SURGERY     x 3   PLANTAR FASCIA SURGERY Left    REMOVAL OF BILATERAL TISSUE EXPANDERS WITH PLACEMENT OF BILATERAL BREAST IMPLANTS Bilateral 06/17/2015   Procedure: DEBRIDEMENT OF BILATERAL MASTECTOMY FLAP WITH BILATERAL TISSUE EXPANDER EXCHANGE ;  Surgeon: Glenna Fellows, MD;  Location: [facility];  Service: Plastics;  Laterality: Bilateral;   REMOVAL OF TISSUE EXPANDER Bilateral 07/12/2015   Procedure: REMOVAL OF BILATERAL TISSUE EXPANDERS;  Surgeon: Glenna Fellows, MD;  Location:  SURGERY CENTER;  Service: Plastics;  Laterality: Bilateral;    Family History  Problem Relation Age of Onset   Cervical cancer Mother  41   Colon cancer Maternal Grandfather  mets to stomach; dx. 67-70   Heart disease Father    Prostate cancer Father 59   Cervical cancer Maternal Grandmother        dx. 48s; treated with radium implant   Lung cancer Sister 66       maternal half-sister dx. lung cancer, stage III   Breast cancer Maternal Aunt    Cervical cancer Sister 7       paternal half-sister; s/p TAH   Spina bifida Grandchild    Brain cancer Grandchild        grandson dx. at 20 mos, treated at [facility]   Breast cancer Other 51       maternal half-sister's daughter   Cancer Maternal Aunt        d. 72; unspecified type - "began at back area and moved to vital organs"   Cancer Maternal Uncle         late 3s; unspecified type; "started at back and moved to vital organs"   Social History   Socioeconomic History   Marital status: Widowed    Spouse name: Not on file   Number of children: 1   Years of education: 24   Highest education level: Not on file  Occupational History    Comment: [employer], retired  Tobacco Use   Smoking status: Every Day    Current packs/day: 0.25    Types: Cigarettes   Smokeless tobacco: Never   Tobacco comments:    Currently wearing nicotine patches  Vaping Use   Vaping status: Never Used  Substance and Sexual Activity   Alcohol use: Never   Drug use: No   Sexual activity: Not Currently  Other Topics Concern   Not on file  Social History Narrative   Lives alone, widow   Caffeine use- tea, 1 cup daily   Right Handed    Lives in a one story [place] home    Social Determinants of Health   Financial Resource Strain: Low Risk  (02/17/2021)   Overall Financial Resource Strain (CARDIA)    Difficulty of Paying Living Expenses: Not hard at all  Food Insecurity: Food Insecurity Present (03/08/2022)   Hunger Vital Sign    Worried About Running Out of Food in the Last Year: Never true    Ran Out of Food in the Last Year: Sometimes true  Transportation Needs: No  Transportation Needs (03/08/2022)   PRAPARE - Administrator, Civil Service (Medical): No    Lack of Transportation (Non-Medical): No  Physical Activity: Inactive (03/08/2022)   Exercise Vital Sign    Days of Exercise per Week: 0 days    Minutes of Exercise per Session: 0 min  Stress: Stress Concern Present (03/08/2022)   [employer] of Occupational Health - Occupational Stress Questionnaire    Feeling of Stress : To some extent  Social Connections: Socially Isolated (05/17/2022)   Social Connection and Isolation Panel [NHANES]    Frequency of Communication with Friends and Family: Twice a week    Frequency of Social Gatherings with Friends and Family: Twice a week    Attends Religious Services: Never    Database administrator or Organizations: No    Attends Engineer, structural: Never    Marital Status: Divorced    Objective:  BP 118/64   Pulse 88   Temp (!) 97 F (36.1 C)   Resp 18   Ht 5' (1.524 m)   Wt 92 lb (41.7 kg)  BMI 17.97 kg/m      11/26/2022    3:39 PM 10/23/2022    9:45 AM 10/04/2022    2:45 PM  BP/Weight  Systolic BP 118 112 110  Diastolic BP 64 60 60  Wt. (Lbs) 92 89 85.2  BMI 17.97 kg/m2 17.38 kg/m2 16.64 kg/m2    Physical Exam Vitals reviewed.  Constitutional:      Comments: Very thin.  Neck:     Vascular: No carotid bruit.  Cardiovascular:     Rate and Rhythm: Normal rate and regular rhythm.     Heart sounds: Normal heart sounds.  Pulmonary:     Effort: Pulmonary effort is normal. No respiratory distress.     Breath sounds: Normal breath sounds.  Abdominal:     General: Abdomen is flat. Bowel sounds are normal.     Palpations: Abdomen is soft.     Tenderness: There is no abdominal tenderness.  Neurological:     Mental Status: She is alert.  Psychiatric:        Mood and Affect: Mood normal.        Behavior: Behavior normal.     Diabetic Foot Exam - Simple   No data filed      Lab Results  Component Value Date    WBC 8.2 10/23/2022   HGB 10.7 (L) 10/23/2022   HCT 34.8 10/23/2022   PLT 272 10/23/2022   GLUCOSE 91 10/23/2022   CHOL 171 09/19/2022   TRIG 100 09/19/2022   HDL 49 09/19/2022   LDLCALC 104 (H) 09/19/2022   ALT 10 10/23/2022   AST 16 10/23/2022   NA 145 (H) 10/23/2022   K 4.5 10/23/2022   CL 110 (H) 10/23/2022   CREATININE 0.84 10/23/2022   BUN 20 10/23/2022   CO2 19 (L) 10/23/2022   TSH 2.560 10/23/2022      Assessment & Plan:    Moderate dementia with mood disturbance, unspecified dementia type [facility]) Assessment & Plan: Increase trazodone 100 mg once daily at night.  Remain off xanax, amitriptyline, primidone.  Start donepezil 5 mg before bed.    Sleep disorder Assessment & Plan: Increase trazodone 100 mg once daily at night.  Remain off xanax, amitriptyline, primidone.    Migraine aura without headache Assessment & Plan: Off qlipta. Did not get patient assistance or did not fill out forms.   Weight loss  Severe protein-calorie malnutrition Lily Kocher: less than 60% of standard weight) (HCC) Assessment & Plan: Strongly recommended patient eat 3 meals a day.  Also recommend protein shakes 2-3 times per day.   Right lumbar radiculopathy -     HYDROcodone-Acetaminophen; TAKE ONE TABLET BY MOUTH daily AS NEEDED FOR SEVERE pain (migranes)  Dispense: 30 tablet; Refill: 0  Pulmonary embolism and infarction [facility]) Assessment & Plan:  Patient ran out of Eliquis and is unable to pay for it.  She ran out 2 weeks ago again and did not call.  Sample of Eliquis 2.5 mg twice daily.  I had 1 week of this sample only.   Other orders -     Donepezil HCl; Take 1 tablet (5 mg total) by mouth at bedtime.  Dispense: 30 tablet; Refill: 0 -     Promethazine HCl; Take 1 tablet (25 mg total) by mouth every 8 (eight) hours as needed for nausea or vomiting.  Dispense: 30 tablet; Refill: 0     Meds ordered this encounter  Medications   HYDROcodone-acetaminophen (NORCO/VICODIN)  5-325 MG tablet    Sig:  TAKE ONE TABLET BY MOUTH daily AS NEEDED FOR SEVERE pain (migranes)    Dispense:  30 tablet    Refill:  0   donepezil (ARICEPT) 5 MG tablet    Sig: Take 1 tablet (5 mg total) by mouth at bedtime.    Dispense:  30 tablet    Refill:  0   promethazine (PHENERGAN) 25 MG tablet    Sig: Take 1 tablet (25 mg total) by mouth every 8 (eight) hours as needed for nausea or vomiting.    Dispense:  30 tablet    Refill:  0    No orders of the defined types were placed in this encounter.    Follow-up: Return in about 4 weeks (around 12/24/2022) for chronic follow up.   I,Katherina A Bramblett,acting as a scribe for Blane Ohara, MD.,have documented all relevant documentation on the behalf of Blane Ohara, MD,as directed by  Blane Ohara, MD while in the presence of Blane Ohara, MD.   An After Visit Summary was printed and given to the patient.  I attest that I have reviewed this visit and agree with the plan scribed by my staff.   Blane Ohara, MD [facility] [phone]

## 2022-11-26 NOTE — Patient Instructions (Addendum)
Increase trazodone 100 mg once daily at night.  Remain off xanax, amitriptyline, primidone.   Start donepezil 5 mg before bed.

## 2022-11-29 DIAGNOSIS — F03B3 Unspecified dementia, moderate, with mood disturbance: Secondary | ICD-10-CM | POA: Insufficient documentation

## 2022-11-29 DIAGNOSIS — F0280 Dementia in other diseases classified elsewhere without behavioral disturbance: Secondary | ICD-10-CM | POA: Insufficient documentation

## 2022-11-29 DIAGNOSIS — F03A3 Unspecified dementia, mild, with mood disturbance: Secondary | ICD-10-CM | POA: Insufficient documentation

## 2022-11-29 NOTE — Assessment & Plan Note (Signed)
The current medical regimen is effective;  continue present plan and medications.  On atorvastatin.

## 2022-11-29 NOTE — Assessment & Plan Note (Signed)
Patient ran out of Eliquis and is unable to pay for it.  She ran out 2 weeks ago again and did not call.  Sample of Eliquis 2.5 mg twice daily.  I had 1 week of this sample only.

## 2022-12-02 ENCOUNTER — Encounter: Payer: Self-pay | Admitting: Family Medicine

## 2022-12-02 NOTE — Assessment & Plan Note (Signed)
Increase trazodone 100 mg once daily at night.  Remain off xanax, amitriptyline, primidone.   Start donepezil 5 mg before bed.

## 2022-12-02 NOTE — Assessment & Plan Note (Signed)
Strongly recommended patient eat 3 meals a day.  Also recommend protein shakes 2-3 times per day.

## 2022-12-02 NOTE — Assessment & Plan Note (Signed)
Increase trazodone 100 mg once daily at night.  Remain off xanax, amitriptyline, primidone.

## 2022-12-02 NOTE — Assessment & Plan Note (Signed)
Off qlipta. Did not get patient assistance or did not fill out forms.

## 2022-12-03 ENCOUNTER — Encounter: Payer: Self-pay | Admitting: Family Medicine

## 2022-12-03 ENCOUNTER — Ambulatory Visit (INDEPENDENT_AMBULATORY_CARE_PROVIDER_SITE_OTHER): Payer: Medicare PPO | Admitting: Family Medicine

## 2022-12-03 VITALS — BP 130/70 | HR 106 | Temp 96.8°F | Resp 18 | Ht 60.0 in | Wt 93.0 lb

## 2022-12-03 DIAGNOSIS — R051 Acute cough: Secondary | ICD-10-CM

## 2022-12-03 DIAGNOSIS — J410 Simple chronic bronchitis: Secondary | ICD-10-CM | POA: Diagnosis not present

## 2022-12-03 DIAGNOSIS — J441 Chronic obstructive pulmonary disease with (acute) exacerbation: Secondary | ICD-10-CM

## 2022-12-03 LAB — POC INFLUENZA TEST: Positive: NEGATIVE

## 2022-12-03 LAB — POC COVID19 BINAXNOW: SARS Coronavirus 2 Ag: NEGATIVE

## 2022-12-03 MED ORDER — CEFTRIAXONE SODIUM 1 G IJ SOLR
1.0000 g | Freq: Once | INTRAMUSCULAR | Status: AC
Start: 2022-12-03 — End: 2022-12-03
  Administered 2022-12-03: 1 g via INTRAMUSCULAR

## 2022-12-03 MED ORDER — TRIAMCINOLONE ACETONIDE 40 MG/ML IJ SUSP
80.0000 mg | Freq: Once | INTRAMUSCULAR | Status: AC
Start: 2022-12-03 — End: 2022-12-03
  Administered 2022-12-03: 80 mg via INTRAMUSCULAR

## 2022-12-03 MED ORDER — IPRATROPIUM-ALBUTEROL 0.5-2.5 (3) MG/3ML IN SOLN
3.0000 mL | Freq: Once | RESPIRATORY_TRACT | Status: DC
Start: 2022-12-03 — End: 2023-01-18

## 2022-12-03 MED ORDER — PREDNISONE 20 MG PO TABS
ORAL_TABLET | ORAL | 0 refills | Status: AC
Start: 2022-12-03 — End: 2022-12-11

## 2022-12-03 MED ORDER — AMOXICILLIN-POT CLAVULANATE 875-125 MG PO TABS: 1.0000 | ORAL_TABLET | Freq: Two times a day (BID) | ORAL | 0 refills | Status: DC

## 2022-12-03 NOTE — Assessment & Plan Note (Signed)
Ordered cxr.  Kenalog 80 mg IM.  Rocephin 1 gm IM.  Prescription: Augmentin 875 mg twice daily x 10 days.   Prednisone long taper.

## 2022-12-03 NOTE — Progress Notes (Signed)
Acute Office Visit  Subjective:    Patient ID: Anne [name], female    DOB: 1946-02-24, 77 y.o.   MRN: [id number]  Chief Complaint  Patient presents with   Cough   Nasal Congestion    HPI: Patient is in today for cough, nasal congestion and headache which started about 1 week ago.   Feverish. Has been 101-102. Complaining of wheezing and tightness in chest.    Past Medical History:  Diagnosis Date   Acquired absence of both breasts and nipples 06/08/2015   Acute infection of nasal sinus 05/04/2021   Aortic atherosclerosis 01/30/2021   Asthma    daily inhalers   Ataxia 10/18/2020   Bilateral carpal tunnel syndrome 01/30/2021   Bipolar disorder, mixed 06/16/2021   Breast cancer of upper-inner quadrant of left female breast 04/15/2015   Carotid artery disease 05/04/2016   Mild, 1-39% bilaterally by doppler in March 2018   Chronic pain in right shoulder 12/25/2020   COPD with acute exacerbation 07/03/2016   Dental crowns present    Drug induced constipation 04/10/2021   EIC (epidermal inclusion cyst) 03/21/2021   Family history of breast cancer in female 04/26/2015   Dx. Niece at 19; dx. Maternal aunt in her late 64s  Formatting of this note might be different from the original. Overview:  Dx. Niece at 70; dx. Maternal aunt in her late 36s   Family history of colon cancer 04/26/2015   Dx. In maternal grandfather in his late 43s-70  Formatting of this note might be different from the original. Overview:  Dx. In maternal grandfather in his late 17s-70   Frequent falls 10/18/2020   Genetic testing 06/02/2015   Negative for pathogenic mutations within any of 20 genes on the Breast/Ovarian Cancer Panel through [employer].  No variants of uncertain significance (VUSes) were found.  The Breast/Ovarian Cancer Panel offered by [facility] Basilio Cairo, MD) includes sequencing and deletion/duplication analysis for the following 19 genes:  ATM, BARD1, BRCA1, BRCA2,  BRIP1, CDH1, CHEK2, FANCC, M   History of basal cell carcinoma 04/26/2015   History of concussion 01/01/2014   History of kidney stones    History of TIA (transient ischemic attack) 10/2014   Hyperlipidemia    Insomnia    Lumbar back pain 10/18/2020   Major depressive disorder 10/18/2020   Malrotation of intestine 09/23/2017   Migraine aura without headache 02/08/2014   Migraine headache 01/30/2021   Nasal congestion 05/26/2015   will finish z-pak 05/27/2015   Nonproductive cough 05/26/2015   PONV (postoperative nausea and vomiting)    "long time ago"  none recently   Psychogenic syncope 04/23/2019   Right lumbar radiculopathy 04/23/2019   RLS (restless legs syndrome)    Simple chronic bronchitis 07/04/2019   Tobacco use    Tremor 10/18/2020   Vitamin D deficiency    Wears dentures    Weight loss 08/28/2019    Past Surgical History:  Procedure Laterality Date   ABDOMINAL HYSTERECTOMY     APPENDECTOMY  2000   BREAST BIOPSY Left    BREAST RECONSTRUCTION WITH PLACEMENT OF TISSUE EXPANDER AND FLEX HD (ACELLULAR HYDRATED DERMIS) Bilateral 06/02/2015   Procedure: BILATERAL BREAST RECONSTRUCTION WITH PLACEMENT OF TISSUE EXPANDER AND  ACELLULAR HYDRATED DERMIS;  Surgeon: Glenna Fellows, MD;  Location: [facility];  Service: Plastics;  Laterality: Bilateral;   CARPAL TUNNEL RELEASE Bilateral    CATARACT EXTRACTION Bilateral 09/2013   KNEE ARTHROSCOPY Left    LAPAROSCOPIC ABDOMINAL EXPLORATION N/A 10/2017  LAPAROSCOPIC LYSIS OF CONGENITAL ADHESIVE BAND/LADD'S BANDS WIH INTRAOPERATIVE CHOLANGIOGRAPHY. Perforation of small intestion requiring repair.    LOOP RECORDER INSERTION N/A 05/11/2016   Procedure: Loop Recorder Insertion;  Surgeon: Will Jorja Loa, MD;  Location: [facility];  Service: Cardiovascular;  Laterality: N/A;   MASTECTOMY W/ SENTINEL NODE BIOPSY Bilateral 06/02/2015   Procedure: BILATERAL MASTECTOMY WITH LEFT SENTINEL LYMPH NODE BIOPSY;  Surgeon:  Almond Lint, MD;  Location: [facility];  Service: General;  Laterality: Bilateral;   NASAL SEPTUM SURGERY     x 3   PLANTAR FASCIA SURGERY Left    REMOVAL OF BILATERAL TISSUE EXPANDERS WITH PLACEMENT OF BILATERAL BREAST IMPLANTS Bilateral 06/17/2015   Procedure: DEBRIDEMENT OF BILATERAL MASTECTOMY FLAP WITH BILATERAL TISSUE EXPANDER EXCHANGE ;  Surgeon: Glenna Fellows, MD;  Location: [facility];  Service: Plastics;  Laterality: Bilateral;   REMOVAL OF TISSUE EXPANDER Bilateral 07/12/2015   Procedure: REMOVAL OF BILATERAL TISSUE EXPANDERS;  Surgeon: Glenna Fellows, MD;  Location: [facility];  Service: Plastics;  Laterality: Bilateral;    Family History  Problem Relation Age of Onset   Cervical cancer Mother 47   Colon cancer Maternal Grandfather        mets to stomach; dx. 67-70   Heart disease Father    Prostate cancer Father 40   Cervical cancer Maternal Grandmother        dx. 64s; treated with radium implant   Lung cancer Sister 58       maternal half-sister dx. lung cancer, stage III   Breast cancer Maternal Aunt    Cervical cancer Sister 34       paternal half-sister; s/p TAH   Spina bifida Grandchild    Brain cancer Grandchild        grandson dx. at 20 mos, [facility]   Breast cancer Other 51       maternal half-sister's daughter   Cancer Maternal Aunt        d. 68; unspecified type - "began at back area and moved to vital organs"   Cancer Maternal Uncle         late 46s; unspecified type; "started at back and moved to vital organs"    Social History   Socioeconomic History   Marital status: Widowed    Spouse name: Not on file   Number of children: 1   Years of education: 53   Highest education level: Not on file  Occupational History    Comment: [employer], retired  Tobacco Use   Smoking status: Every Day    Current packs/day: 0.25    Types: Cigarettes   Smokeless tobacco: Never   Tobacco comments:    Currently wearing  nicotine patches  Vaping Use   Vaping status: Never Used  Substance and Sexual Activity   Alcohol use: Never   Drug use: No   Sexual activity: Not Currently  Other Topics Concern   Not on file  Social History Narrative   Lives alone, widow   Caffeine use- tea, 1 cup daily   Right Handed    Lives in a one story [place] home    Social Determinants of Health   Financial Resource Strain: Low Risk  (02/17/2021)   Overall Financial Resource Strain (CARDIA)    Difficulty of Paying Living Expenses: Not hard at all  Food Insecurity: Food Insecurity Present (03/08/2022)   Hunger Vital Sign    Worried About Running Out of Food in the Last  Year: Never true    Ran Out of Food in the Last Year: Sometimes true  Transportation Needs: No Transportation Needs (03/08/2022)   PRAPARE - Administrator, Civil Service (Medical): No    Lack of Transportation (Non-Medical): No  Physical Activity: Inactive (03/08/2022)   Exercise Vital Sign    Days of Exercise per Week: 0 days    Minutes of Exercise per Session: 0 min  Stress: Stress Concern Present (03/08/2022)   [employer] of Occupational Health - Occupational Stress Questionnaire    Feeling of Stress : To some extent  Social Connections: Socially Isolated (05/17/2022)   Social Connection and Isolation Panel [NHANES]    Frequency of Communication with Friends and Family: Twice a week    Frequency of Social Gatherings with Friends and Family: Twice a week    Attends Religious Services: Never    Database administrator or Organizations: No    Attends Banker Meetings: Never    Marital Status: Divorced  Catering manager Violence: Not At Risk (05/17/2022)   Humiliation, Afraid, Rape, and Kick questionnaire    Fear of Current or Ex-Partner: No    Emotionally Abused: No    Physically Abused: No    Sexually Abused: No    Outpatient Medications Prior to Visit  Medication Sig Dispense Refill   albuterol (PROVENTIL) (2.5 MG/3ML)  0.083% nebulizer solution Take 2.5 mg by nebulization 4 (four) times daily.     albuterol (VENTOLIN HFA) 108 (90 Base) MCG/ACT inhaler INHALE 1-2 PUFFS BY MOUTH into THE lungs every SIX hours AS NEEDED FOR WHEEZING AND/OR SHORTNESS OF BREATH 8.5 g 2   amitriptyline (ELAVIL) 25 MG tablet TAKE 1 TABLET AT BEDTIME 90 tablet 1   atorvastatin (LIPITOR) 10 MG tablet TAKE 1 TABLET EVERY DAY 90 tablet 1   Budeson-Glycopyrrol-Formoterol (BREZTRI AEROSPHERE) 160-9-4.8 MCG/ACT AERO Inhale 2 puffs into the lungs 2 (two) times daily. 3 each 3   Cyanocobalamin (B-12 COMPLIANCE INJECTION) 1000 MCG/ML KIT Inject 1 mL as directed every 30 (thirty) days.     donepezil (ARICEPT) 5 MG tablet Take 1 tablet (5 mg total) by mouth at bedtime. 30 tablet 0   gabapentin (NEURONTIN) 300 MG capsule Take 1 capsule (300 mg total) by mouth 3 (three) times daily.     HYDROcodone-acetaminophen (NORCO/VICODIN) 5-325 MG tablet TAKE ONE TABLET BY MOUTH daily AS NEEDED FOR SEVERE pain (migranes) 30 tablet 0   megestrol (MEGACE) 20 MG tablet TAKE 2 TABLETS EVERY DAY 180 tablet 3   melatonin 10 MG TABS Take 20 mg by mouth at bedtime. 1 tablet 0   meloxicam (MOBIC) 15 MG tablet TAKE 1 TABLET EVERY DAY 90 tablet 0   midodrine (PROAMATINE) 5 MG tablet Take 1 tablet (5 mg total) by mouth 3 (three) times daily with meals. 90 tablet 1   mirtazapine (REMERON) 45 MG tablet TAKE 1 TABLET AT BEDTIME 90 tablet 3   montelukast (SINGULAIR) 10 MG tablet TAKE 1 TABLET EVERY DAY 90 tablet 3   omeprazole (PRILOSEC) 40 MG capsule TAKE 1 CAPSULE TWICE DAILY 180 capsule 3   promethazine (PHENERGAN) 25 MG tablet Take 1 tablet (25 mg total) by mouth every 8 (eight) hours as needed for nausea or vomiting. 30 tablet 0   rizatriptan (MAXALT-MLT) 10 MG disintegrating tablet Take 1 tablet at onset of headache. May repeat 1 dose in 2 hours if needed. Max 2 tabs in 24 hours. 10 tablet 2   sertraline (ZOLOFT) 100 MG tablet  TAKE 1 TABLET EVERY DAY 90 tablet 1    SYRINGE-NEEDLE, DISP, 3 ML (LUER LOCK SAFETY SYRINGES) 25G X 1" 3 ML MISC Inject 1 each into the muscle every 30 (thirty) days. 50 each 0   traZODone (DESYREL) 100 MG tablet Take 0.5 tablets (50 mg total) by mouth at bedtime as needed. for sleep     Vitamin D, Ergocalciferol, (DRISDOL) 1.25 MG (50000 UNIT) CAPS capsule Take 1 capsule (50,000 Units total) by mouth every 7 (seven) days. 12 capsule 1   Facility-Administered Medications Prior to Visit  Medication Dose Route Frequency Provider Last Rate Last Admin   [COMPLETED] ipratropium-albuterol (DUONEB) 0.5-2.5 (3) MG/3ML nebulizer solution 3 mL  3 mL Nebulization Once    3 mL at 12/03/22 1629    Allergies  Allergen Reactions   Clindamycin/Lincomycin Nausea And Vomiting   Lyrica [Pregabalin] Other (See Comments)    PERSONALITY CHANGE    Review of Systems  Constitutional:  Positive for chills, fatigue and fever.  HENT:  Positive for congestion. Negative for ear pain, rhinorrhea and sore throat.   Respiratory:  Positive for cough, shortness of breath and wheezing.   Cardiovascular:  Negative for chest pain.  Gastrointestinal:  Negative for abdominal pain, constipation, diarrhea, nausea and vomiting.  Genitourinary:  Negative for dysuria and urgency.  Musculoskeletal:  Negative for back pain and myalgias.  Neurological:  Positive for headaches. Negative for dizziness, weakness and light-headedness.  Psychiatric/Behavioral:  Negative for dysphoric mood. The patient is not nervous/anxious.        Objective:        12/03/2022    3:49 PM 11/26/2022    3:39 PM 10/23/2022    9:45 AM  Vitals with BMI  Height 5\' 0"  5\' 0"  5\' 0"   Weight 93 lbs 92 lbs 89 lbs  BMI 18.16 17.97 17.38  Systolic 130 118 578  Diastolic 70 64 60  Pulse 106 88 96    No data found.   Physical Exam Vitals reviewed.  Constitutional:      Appearance: Normal appearance.  HENT:     Right Ear: Tympanic membrane, ear canal and external ear normal.     Left Ear:  Tympanic membrane, ear canal and external ear normal.     Nose: Nose normal.     Mouth/Throat:     Pharynx: Oropharynx is clear. No oropharyngeal exudate or posterior oropharyngeal erythema.  Cardiovascular:     Rate and Rhythm: Normal rate and regular rhythm.     Heart sounds: Normal heart sounds. No murmur heard. Pulmonary:     Effort: Pulmonary effort is normal. No respiratory distress.     Comments: Poor air exchange.  Duoneb treatment given.  Slightly improved air exchange. No wheezing.  Lymphadenopathy:     Cervical: Cervical adenopathy present.  Neurological:     Mental Status: She is alert.  Psychiatric:        Mood and Affect: Mood normal.        Behavior: Behavior normal.    Health Maintenance Due  Topic Date Due   Hepatitis C Screening  Never done   COVID-19 Vaccine (3 - Moderna risk series) 11/26/2021   Zoster Vaccines- Shingrix (2 of 2) 04/02/2022   Lung Cancer Screening  05/30/2022    There are no preventive care reminders to display for this patient.   Lab Results  Component Value Date   TSH 2.560 10/23/2022   Lab Results  Component Value Date   WBC 8.2 10/23/2022   HGB  10.7 (L) 10/23/2022   HCT 34.8 10/23/2022   MCV 92 10/23/2022   PLT 272 10/23/2022   Lab Results  Component Value Date   NA 145 (H) 10/23/2022   K 4.5 10/23/2022   CO2 19 (L) 10/23/2022   GLUCOSE 91 10/23/2022   BUN 20 10/23/2022   CREATININE 0.84 10/23/2022   BILITOT <0.2 10/23/2022   ALKPHOS 82 10/23/2022   AST 16 10/23/2022   ALT 10 10/23/2022   PROT 5.9 (L) 10/23/2022   ALBUMIN 4.1 10/23/2022   CALCIUM 9.3 10/23/2022   ANIONGAP 3 (L) 07/03/2016   EGFR 72 10/23/2022   Lab Results  Component Value Date   CHOL 171 09/19/2022   Lab Results  Component Value Date   HDL 49 09/19/2022   Lab Results  Component Value Date   LDLCALC 104 (H) 09/19/2022   Lab Results  Component Value Date   TRIG 100 09/19/2022   Lab Results  Component Value Date   CHOLHDL 3.5  09/19/2022   No results found for: "HGBA1C"     Assessment & Plan:  Acute cough Assessment & Plan: Covid and flu negative.   Orders: -     POC COVID-19 BinaxNow -     Influenza Test (POS/NEG)  COPD exacerbation (HCC) Assessment & Plan: Ordered cxr.  Kenalog 80 mg IM.  Rocephin 1 gm IM.  Prescription: Augmentin 875 mg twice daily x 10 days.   Prednisone long taper.   Orders: -     predniSONE; Take 3 tablets (60 mg total) by mouth daily with breakfast for 3 days, THEN 2 tablets (40 mg total) daily with breakfast for 3 days, THEN 1 tablet (20 mg total) daily with breakfast for 3 days.  Dispense: 18 tablet; Refill: 0 -     DG Chest 2 View; Future -     cefTRIAXone Sodium -     Triamcinolone Acetonide -     Ipratropium-Albuterol  Other orders -     Amoxicillin-Pot Clavulanate; Take 1 tablet by mouth 2 (two) times daily.  Dispense: 20 tablet; Refill: 0     Meds ordered this encounter  Medications   amoxicillin-clavulanate (AUGMENTIN) 875-125 MG tablet    Sig: Take 1 tablet by mouth 2 (two) times daily.    Dispense:  20 tablet    Refill:  0   predniSONE (DELTASONE) 20 MG tablet    Sig: Take 3 tablets (60 mg total) by mouth daily with breakfast for 3 days, THEN 2 tablets (40 mg total) daily with breakfast for 3 days, THEN 1 tablet (20 mg total) daily with breakfast for 3 days.    Dispense:  18 tablet    Refill:  0   cefTRIAXone (ROCEPHIN) injection 1 g   triamcinolone acetonide (KENALOG-40) injection 80 mg   ipratropium-albuterol (DUONEB) 0.5-2.5 (3) MG/3ML nebulizer solution 3 mL    Orders Placed This Encounter  Procedures   DG Chest 2 View   POC COVID-19   Influenza Test (POS/NEG)     Follow-up: Return if symptoms worsen or fail to improve.  An After Visit Summary was printed and given to the patient.  Blane Ohara, MD [facility] [phone]

## 2022-12-03 NOTE — Assessment & Plan Note (Signed)
Covid and flu negative

## 2022-12-04 ENCOUNTER — Telehealth: Payer: Self-pay | Admitting: Pharmacist

## 2022-12-04 NOTE — Progress Notes (Signed)
12/04/2022 Name: Anne [name] MRN: [id number] DOB: 21-Sep-1945  Care Coordination Call  Attempted outreach in response to PCP staff message regarding patient out of eliquis, request to check on patient assistance status. No documentation that we have received patient portion of application.  Attempted all numbers on file (home, mobile) without success, no VM option available.  Attempted son as listed contact on file, no answer, left HIPAA compliant VM to return my call at his convenience.  Lynnda Shields, PharmD, BCPS Clinical Pharmacist [facility]

## 2022-12-07 NOTE — Telephone Encounter (Signed)
PAP: Application for ELIQUIS has been submitted to PAP Companies: [employer], via fax  PLEASE BE ADVISED PROVIDER PG HAD TO RESENT FOR MISSING WHERE PT WANTED  HER ELIQUIS MAIL TO. I FIXED ITAND SENT BACK  TO BMS. THAT WAS THE HOLD UP.

## 2022-12-11 DIAGNOSIS — J449 Chronic obstructive pulmonary disease, unspecified: Secondary | ICD-10-CM | POA: Diagnosis not present

## 2022-12-19 ENCOUNTER — Other Ambulatory Visit: Payer: Self-pay | Admitting: Family Medicine

## 2022-12-24 ENCOUNTER — Ambulatory Visit: Payer: Medicare PPO | Admitting: Family Medicine

## 2022-12-27 NOTE — Telephone Encounter (Signed)
PAP: Patient has been denied for pt assistance by PAP Companies: [employer] due to patient above income threshold for program  PT NOT ELIGIBLE FOR FOLLOWING REASON: DOCUMENTATION OF 3% OOP PRESCRIPTION EXPENSES BASED N HOUSEHOLD ADJUSTED GROSS INCOME NOT MET...  LETTER HAS BEEN SCANNED IN MEDIA OF CHART

## 2022-12-31 ENCOUNTER — Encounter: Payer: Self-pay | Admitting: Family Medicine

## 2022-12-31 ENCOUNTER — Other Ambulatory Visit: Payer: Self-pay | Admitting: Family Medicine

## 2022-12-31 NOTE — Assessment & Plan Note (Signed)
Well controlled.  No changes to medicines.  Continue Lipitor 10 mg nightly Continue to work on eating a healthy diet and exercise.  Labs drawn today.

## 2022-12-31 NOTE — Assessment & Plan Note (Signed)
Continue Breztri 2 puffs twice daily. Continue albuterol inhaler 4 times daily as needed shortness of breath or wheezing.

## 2023-01-01 ENCOUNTER — Ambulatory Visit (INDEPENDENT_AMBULATORY_CARE_PROVIDER_SITE_OTHER): Payer: Medicare PPO | Admitting: Family Medicine

## 2023-01-01 ENCOUNTER — Encounter: Payer: Self-pay | Admitting: Family Medicine

## 2023-01-01 VITALS — BP 138/64 | HR 116 | Temp 96.2°F | Resp 18 | Ht 60.0 in | Wt 90.8 lb

## 2023-01-01 DIAGNOSIS — E43 Unspecified severe protein-calorie malnutrition: Secondary | ICD-10-CM

## 2023-01-01 DIAGNOSIS — E782 Mixed hyperlipidemia: Secondary | ICD-10-CM | POA: Diagnosis not present

## 2023-01-01 DIAGNOSIS — J410 Simple chronic bronchitis: Secondary | ICD-10-CM | POA: Diagnosis not present

## 2023-01-01 DIAGNOSIS — F03A3 Unspecified dementia, mild, with mood disturbance: Secondary | ICD-10-CM | POA: Diagnosis not present

## 2023-01-01 DIAGNOSIS — G43109 Migraine with aura, not intractable, without status migrainosus: Secondary | ICD-10-CM | POA: Diagnosis not present

## 2023-01-01 DIAGNOSIS — I950 Idiopathic hypotension: Secondary | ICD-10-CM

## 2023-01-01 DIAGNOSIS — I2699 Other pulmonary embolism without acute cor pulmonale: Secondary | ICD-10-CM

## 2023-01-01 DIAGNOSIS — Z23 Encounter for immunization: Secondary | ICD-10-CM | POA: Diagnosis not present

## 2023-01-01 MED ORDER — BREZTRI AEROSPHERE 160-9-4.8 MCG/ACT IN AERO: 2.0000 | INHALATION_SPRAY | Freq: Two times a day (BID) | RESPIRATORY_TRACT | 3 refills | Status: DC

## 2023-01-01 MED ORDER — DONEPEZIL HCL 10 MG PO TBDP
10.0000 mg | ORAL_TABLET | Freq: Every day | ORAL | 1 refills | Status: DC
Start: 2023-01-01 — End: 2023-02-04

## 2023-01-01 MED ORDER — MEGESTROL ACETATE 20 MG PO TABS
40.0000 mg | ORAL_TABLET | Freq: Every day | ORAL | 0 refills | Status: DC
Start: 2023-01-01 — End: 2023-02-04

## 2023-01-01 MED ORDER — RIZATRIPTAN BENZOATE 10 MG PO TBDP
ORAL_TABLET | ORAL | 2 refills | Status: DC
Start: 2023-01-01 — End: 2023-02-04

## 2023-01-03 ENCOUNTER — Telehealth: Payer: Self-pay

## 2023-01-03 NOTE — Telephone Encounter (Signed)
Patient left a voicemail to call her back. I called back, but I could not reach patient or leave a voicemail.

## 2023-01-03 NOTE — Progress Notes (Signed)
ERROR

## 2023-01-03 NOTE — Progress Notes (Signed)
01/03/2023  Patient ID: Anne [name], female   DOB: Jul 26, 1945, 77 y.o.   MRN: [id number]  Clinic routed request from PCP, Dr. [name], to schedule telephone visit with patient to discuss adherence packaging/home delivery of medications.  Tried to call home and mobile numbers x2 but got no answer and unable to leave a voicemail.  I will also send an email to the email address on file requesting that patient call me (has not accessed MyChart since August).  [name], PharmD, DPLA

## 2023-01-04 ENCOUNTER — Other Ambulatory Visit: Payer: Medicare PPO

## 2023-01-04 NOTE — Progress Notes (Signed)
   01/04/2023  Patient ID: Anne [name], female   DOB: 12/02/1945, 77 y.o.   MRN: [id number]  Outreach attempt to discuss adherence packaging and home delivery at request of Dr. [name].  I was not able to reach the patient or leave a voicemail on either number listed.  I will try to contact again; otherwise, I will make myself free during time of her next follow-up with Dr. [name] to discuss with her over the phone or in person.  [name], PharmD, DPLA

## 2023-01-05 NOTE — Assessment & Plan Note (Signed)
Continue midodrine three times a day. Patient to call to let us know if she has still been taking it.

## 2023-01-05 NOTE — Assessment & Plan Note (Signed)
Completed eliquis.  No further treatment since last CTA showed no PE.

## 2023-01-05 NOTE — Assessment & Plan Note (Signed)
Fair control.  Continue maxalt.

## 2023-01-05 NOTE — Assessment & Plan Note (Signed)
Continue megace.  

## 2023-01-05 NOTE — Assessment & Plan Note (Signed)
Increase aricept to 10 mg before bed.

## 2023-01-09 ENCOUNTER — Other Ambulatory Visit: Payer: Medicare PPO

## 2023-01-09 ENCOUNTER — Ambulatory Visit: Payer: Self-pay | Admitting: Family Medicine

## 2023-01-09 NOTE — Telephone Encounter (Signed)
Pt called requesting which immunizations she was needing. Pt agreeable to provider visit for vaccines and chronic cough.

## 2023-01-10 ENCOUNTER — Encounter: Payer: Self-pay | Admitting: Family Medicine

## 2023-01-10 ENCOUNTER — Ambulatory Visit (INDEPENDENT_AMBULATORY_CARE_PROVIDER_SITE_OTHER): Payer: Medicare PPO | Admitting: Family Medicine

## 2023-01-10 VITALS — BP 122/60 | HR 80 | Resp 18 | Ht 60.0 in | Wt 92.4 lb

## 2023-01-10 DIAGNOSIS — G43109 Migraine with aura, not intractable, without status migrainosus: Secondary | ICD-10-CM | POA: Diagnosis not present

## 2023-01-10 DIAGNOSIS — M79601 Pain in right arm: Secondary | ICD-10-CM

## 2023-01-10 DIAGNOSIS — F331 Major depressive disorder, recurrent, moderate: Secondary | ICD-10-CM

## 2023-01-10 DIAGNOSIS — J370 Chronic laryngitis: Secondary | ICD-10-CM | POA: Diagnosis not present

## 2023-01-10 DIAGNOSIS — G43511 Persistent migraine aura without cerebral infarction, intractable, with status migrainosus: Secondary | ICD-10-CM | POA: Insufficient documentation

## 2023-01-10 DIAGNOSIS — I951 Orthostatic hypotension: Secondary | ICD-10-CM | POA: Diagnosis not present

## 2023-01-10 DIAGNOSIS — W19XXXA Unspecified fall, initial encounter: Secondary | ICD-10-CM | POA: Diagnosis not present

## 2023-01-10 DIAGNOSIS — F321 Major depressive disorder, single episode, moderate: Secondary | ICD-10-CM | POA: Insufficient documentation

## 2023-01-10 MED ORDER — MIDODRINE HCL 5 MG PO TABS: 5.0000 mg | ORAL_TABLET | Freq: Three times a day (TID) | ORAL | 1 refills | Status: DC

## 2023-01-10 MED ORDER — CARIPRAZINE HCL 1.5 MG PO CAPS: 1.5000 mg | ORAL_CAPSULE | Freq: Every day | ORAL | 0 refills | Status: DC

## 2023-01-10 NOTE — Progress Notes (Signed)
Acute Office Visit  Subjective:    Patient ID: Anne [name], female    DOB: 08-02-1945, 77 y.o.   MRN: [id number]  Chief Complaint  Patient presents with   Cough   Fall    LOV:FIEPPIR is in today for cough as well as a wound on her right arm after a fall yesterday  Fall: Patient states that she fell two nights ago.  She said she got up to use the bathroom and it was dark and when she was coming out of the bathroom she fell on her right arm.  Patient has not been to get an xray yet.  She has not taken anything for pain.  She bandaged her arm the best that she could.  She currently has pain and decreased range of motion in her right arm.  Cough: Her COPD is well controlled with Breztri 2 puffs twice daily and albuterol inhaler 4 times daily as needed. She says her cough has improved as well and that wheezing. Denies chest pain and her shortness of breath is at baseline for her.  Depression: The patient states that her depression is worse.  She is on medication for depression, Zoloft 100 mg once daily.  The patient states that she canceled Thanksgiving with her son this year because of her depression.  Patient would like something to help with her increased depression.  Hoarseness: Patient reports voice changes over the last several months.  She states that it has been more than 6 months that she has noticed her voice changing.  Patient smokes 1 pack of cigarettes a day.  Patient has tried to increase her water intake, rest her voice, and has been given antibiotics in the past. Patient is concerned and would like a referral to ENT.   Hypotension: The patient states that she needs a refill on the Midodrin 5 mg.  She states that she is supposed to take this with meals 3 times a day but she only eats 1 meal a day.  Patient says her hypotension is improved with this medication.  Patient Assistance: Patient explains that she has Medicare insurance and [place] and she still gets billed for her  medications she does not understand why she still has bills.  Patient expressed concern for not being able to pay her medical bills and for her medications and would like patient assistance to contact her.    Past Medical History:  Diagnosis Date   Acquired absence of both breasts and nipples 06/08/2015   Acute infection of nasal sinus 05/04/2021   Aortic atherosclerosis 01/30/2021   Asthma    daily inhalers   Ataxia 10/18/2020   Bilateral carpal tunnel syndrome 01/30/2021   Bipolar disorder, mixed 06/16/2021   Breast cancer of upper-inner quadrant of left female breast 04/15/2015   Bronchopneumonia 07/22/2022   Carotid artery disease 05/04/2016   Mild, 1-39% bilaterally by doppler in March 2018   Chronic pain in right shoulder 12/25/2020   COPD with acute exacerbation 07/03/2016   Dental crowns present    Drug induced constipation 04/10/2021   EIC (epidermal inclusion cyst) 03/21/2021   Family history of breast cancer in female 04/26/2015   Dx. Niece at 51; dx. Maternal aunt in her late 64s  Formatting of this note might be different from the original. Overview:  Dx. Niece at 30; dx. Maternal aunt in her late 21s   Family history of colon cancer 04/26/2015   Dx. In maternal grandfather in his late 40s-70  Formatting  of this note might be different from the original. Overview:  Dx. In maternal grandfather in his late 59s-70   Frequent falls 10/18/2020   Genetic testing 06/02/2015   Negative for pathogenic mutations within any of 20 genes on the Breast/Ovarian Cancer Panel through [employer].  No variants of uncertain significance (VUSes) were found.  The Breast/Ovarian Cancer Panel offered by [facility] Basilio Cairo, MD) includes sequencing and deletion/duplication analysis for the following 19 genes:  ATM, BARD1, BRCA1, BRCA2, BRIP1, CDH1, CHEK2, FANCC, M   History of basal cell carcinoma 04/26/2015   History of concussion 01/01/2014   History of kidney stones     History of TIA (transient ischemic attack) 10/2014   Hyperlipidemia    Insomnia    Lumbar back pain 10/18/2020   Major depressive disorder 10/18/2020   Malrotation of intestine 09/23/2017   Migraine aura without headache 02/08/2014   Migraine headache 01/30/2021   Nasal congestion 05/26/2015   will finish z-pak 05/27/2015   Nonproductive cough 05/26/2015   PONV (postoperative nausea and vomiting)    "long time ago"  none recently   Psychogenic syncope 04/23/2019   Right lumbar radiculopathy 04/23/2019   RLS (restless legs syndrome)    Simple chronic bronchitis 07/04/2019   Tobacco use    Tremor 10/18/2020   Vitamin D deficiency    Wears dentures    Weight loss 08/28/2019    Past Surgical History:  Procedure Laterality Date   ABDOMINAL HYSTERECTOMY     APPENDECTOMY  2000   BREAST BIOPSY Left    BREAST RECONSTRUCTION WITH PLACEMENT OF TISSUE EXPANDER AND FLEX HD (ACELLULAR HYDRATED DERMIS) Bilateral 06/02/2015   Procedure: BILATERAL BREAST RECONSTRUCTION WITH PLACEMENT OF TISSUE EXPANDER AND  ACELLULAR HYDRATED DERMIS;  Surgeon: Glenna Fellows, MD;  Location: [facility];  Service: Plastics;  Laterality: Bilateral;   CARPAL TUNNEL RELEASE Bilateral    CATARACT EXTRACTION Bilateral 09/2013   KNEE ARTHROSCOPY Left    LAPAROSCOPIC ABDOMINAL EXPLORATION N/A 10/2017   LAPAROSCOPIC LYSIS OF CONGENITAL ADHESIVE BAND/LADD'S BANDS WIH INTRAOPERATIVE CHOLANGIOGRAPHY. Perforation of small intestion requiring repair.    LOOP RECORDER INSERTION N/A 05/11/2016   Procedure: Loop Recorder Insertion;  Surgeon: Will Jorja Loa, MD;  Location: [facility];  Service: Cardiovascular;  Laterality: N/A;   MASTECTOMY W/ SENTINEL NODE BIOPSY Bilateral 06/02/2015   Procedure: BILATERAL MASTECTOMY WITH LEFT SENTINEL LYMPH NODE BIOPSY;  Surgeon: Almond Lint, MD;  Location: [facility];  Service: General;  Laterality: Bilateral;   NASAL SEPTUM SURGERY     x 3   PLANTAR  FASCIA SURGERY Left    REMOVAL OF BILATERAL TISSUE EXPANDERS WITH PLACEMENT OF BILATERAL BREAST IMPLANTS Bilateral 06/17/2015   Procedure: DEBRIDEMENT OF BILATERAL MASTECTOMY FLAP WITH BILATERAL TISSUE EXPANDER EXCHANGE ;  Surgeon: Glenna Fellows, MD;  Location: [facility];  Service: Plastics;  Laterality: Bilateral;   REMOVAL OF TISSUE EXPANDER Bilateral 07/12/2015   Procedure: REMOVAL OF BILATERAL TISSUE EXPANDERS;  Surgeon: Glenna Fellows, MD;  Location: [facility];  Service: Plastics;  Laterality: Bilateral;    Family History  Problem Relation Age of Onset   Cervical cancer Mother 6   Colon cancer Maternal Grandfather        mets to stomach; dx. 67-70   Heart disease Father    Prostate cancer Father 30   Cervical cancer Maternal Grandmother        dx. 70s; treated with radium implant   Lung cancer Sister 51  maternal half-sister dx. lung cancer, stage III   Breast cancer Maternal Aunt    Cervical cancer Sister 64       paternal half-sister; s/p TAH   Spina bifida Grandchild    Brain cancer Grandchild        grandson dx. at 20 mos, treated at [facility]   Breast cancer Other 51       maternal half-sister's daughter   Cancer Maternal Aunt        d. 33; unspecified type - "began at back area and moved to vital organs"   Cancer Maternal Uncle         late 68s; unspecified type; "started at back and moved to vital organs"    Social History   Socioeconomic History   Marital status: Widowed    Spouse name: Not on file   Number of children: 1   Years of education: 19   Highest education level: Not on file  Occupational History    Comment: [employer], retired  Tobacco Use   Smoking status: Every Day    Current packs/day: 0.25    Types: Cigarettes   Smokeless tobacco: Never   Tobacco comments:    Currently wearing nicotine patches  Vaping Use   Vaping status: Never Used  Substance and Sexual Activity   Alcohol use: Never   Drug use: No   Sexual  activity: Not Currently  Other Topics Concern   Not on file  Social History Narrative   Lives alone, widow   Caffeine use- tea, 1 cup daily   Right Handed    Lives in a one story [place] home    Social Determinants of Health   Financial Resource Strain: Low Risk  (02/17/2021)   Overall Financial Resource Strain (CARDIA)    Difficulty of Paying Living Expenses: Not hard at all  Food Insecurity: Food Insecurity Present (03/08/2022)   Hunger Vital Sign    Worried About Running Out of Food in the Last Year: Never true    Ran Out of Food in the Last Year: Sometimes true  Transportation Needs: No Transportation Needs (03/08/2022)   PRAPARE - Administrator, Civil Service (Medical): No    Lack of Transportation (Non-Medical): No  Physical Activity: Inactive (03/08/2022)   Exercise Vital Sign    Days of Exercise per Week: 0 days    Minutes of Exercise per Session: 0 min  Stress: Stress Concern Present (03/08/2022)   [employer] of Occupational Health - Occupational Stress Questionnaire    Feeling of Stress : To some extent  Social Connections: Socially Isolated (05/17/2022)   Social Connection and Isolation Panel [NHANES]    Frequency of Communication with Friends and Family: Twice a week    Frequency of Social Gatherings with Friends and Family: Twice a week    Attends Religious Services: Never    Database administrator or Organizations: No    Attends Banker Meetings: Never    Marital Status: Divorced  Catering manager Violence: Not At Risk (05/17/2022)   Humiliation, Afraid, Rape, and Kick questionnaire    Fear of Current or Ex-Partner: No    Emotionally Abused: No    Physically Abused: No    Sexually Abused: No    Outpatient Medications Prior to Visit  Medication Sig Dispense Refill   albuterol (PROVENTIL) (2.5 MG/3ML) 0.083% nebulizer solution Take 2.5 mg by nebulization 4 (four) times daily.     albuterol (VENTOLIN HFA) 108 (90 Base) MCG/ACT  inhaler  INHALE 1-2 PUFFS BY MOUTH into THE lungs every SIX hours AS NEEDED FOR WHEEZING AND/OR SHORTNESS OF BREATH 8.5 g 2   amitriptyline (ELAVIL) 25 MG tablet TAKE 1 TABLET AT BEDTIME 90 tablet 1   atorvastatin (LIPITOR) 10 MG tablet TAKE 1 TABLET EVERY DAY 90 tablet 1   Budeson-Glycopyrrol-Formoterol (BREZTRI AEROSPHERE) 160-9-4.8 MCG/ACT AERO Inhale 2 puffs into the lungs 2 (two) times daily. 3 each 3   Cyanocobalamin (B-12 COMPLIANCE INJECTION) 1000 MCG/ML KIT Inject 1 mL as directed every 30 (thirty) days.     donepezil (ARICEPT ODT) 10 MG disintegrating tablet Take 1 tablet (10 mg total) by mouth at bedtime. 90 tablet 1   famotidine (PEPCID) 20 MG tablet TAKE 1 TABLET TWICE DAILY 180 tablet 1   gabapentin (NEURONTIN) 300 MG capsule Take 1 capsule (300 mg total) by mouth 3 (three) times daily.     HYDROcodone-acetaminophen (NORCO/VICODIN) 5-325 MG tablet TAKE ONE TABLET BY MOUTH daily AS NEEDED FOR SEVERE pain (migranes) 30 tablet 0   megestrol (MEGACE) 20 MG tablet Take 2 tablets (40 mg total) by mouth daily. 180 tablet 0   melatonin 10 MG TABS Take 20 mg by mouth at bedtime. 1 tablet 0   meloxicam (MOBIC) 15 MG tablet TAKE 1 TABLET EVERY DAY 90 tablet 0   mirtazapine (REMERON) 45 MG tablet TAKE 1 TABLET AT BEDTIME 90 tablet 3   montelukast (SINGULAIR) 10 MG tablet TAKE 1 TABLET EVERY DAY 90 tablet 3   omeprazole (PRILOSEC) 40 MG capsule TAKE 1 CAPSULE TWICE DAILY 180 capsule 3   promethazine (PHENERGAN) 25 MG tablet Take 1 tablet (25 mg total) by mouth every 8 (eight) hours as needed for nausea or vomiting. 30 tablet 0   rizatriptan (MAXALT-MLT) 10 MG disintegrating tablet Take 1 tablet at onset of headache. May repeat 1 dose in 2 hours if needed. Max 2 tabs in 24 hours. 10 tablet 2   sertraline (ZOLOFT) 100 MG tablet TAKE 1 TABLET EVERY DAY 90 tablet 1   SYRINGE-NEEDLE, DISP, 3 ML (LUER LOCK SAFETY SYRINGES) 25G X 1" 3 ML MISC Inject 1 each into the muscle every 30 (thirty) days. 50 each 0    traZODone (DESYREL) 100 MG tablet Take 0.5 tablets (50 mg total) by mouth at bedtime as needed. for sleep     Vitamin D, Ergocalciferol, (DRISDOL) 1.25 MG (50000 UNIT) CAPS capsule Take 1 capsule (50,000 Units total) by mouth every 7 (seven) days. 12 capsule 1   midodrine (PROAMATINE) 5 MG tablet Take 1 tablet (5 mg total) by mouth 3 (three) times daily with meals. 90 tablet 1   Facility-Administered Medications Prior to Visit  Medication Dose Route Frequency Provider Last Rate Last Admin   ipratropium-albuterol (DUONEB) 0.5-2.5 (3) MG/3ML nebulizer solution 3 mL  3 mL Nebulization Once         Allergies  Allergen Reactions   Clindamycin/Lincomycin Nausea And Vomiting   Lyrica [Pregabalin] Other (See Comments)    PERSONALITY CHANGE      01/10/2023   10:19 AM 01/10/2023   10:18 AM 01/01/2023    9:46 AM 11/26/2022    3:42 PM 10/23/2022    9:52 AM  Depression screen PHQ 2/9  Decreased Interest 2 2 1 1 2   Down, Depressed, Hopeless 3 3 1 1 2   PHQ - 2 Score 5 5 2 2 4   Altered sleeping 2 2 1 3 3   Tired, decreased energy 2  1 3 2   Change in appetite 3  2 3 3   Feeling bad or failure about yourself  0  0 0 0  Trouble concentrating 1  1 1 1   Moving slowly or fidgety/restless 1  0 1 1  Suicidal thoughts 0  0  0  PHQ-9 Score 14 7 7 13 14   Difficult doing work/chores Somewhat difficult  Somewhat difficult Somewhat difficult Somewhat difficult       01/10/2023   10:19 AM 01/01/2023    9:46 AM 11/26/2022    3:44 PM 10/23/2022    9:52 AM  GAD 7 : Generalized Anxiety Score  Nervous, Anxious, on Edge 2 2 1 2   Control/stop worrying 2 1 1 1   Worry too much - different things 2 1 1 1   Trouble relaxing 2 1 1 3   Restless 2 1 1 1   Easily annoyed or irritable 2 1 0 1  Afraid - awful might happen 0 0 2 0  Total GAD 7 Score 12 7 7 9   Anxiety Difficulty Somewhat difficult Somewhat difficult Somewhat difficult Somewhat difficult     Review of Systems  Constitutional:  Positive for fatigue. Negative  for chills, diaphoresis and fever.  HENT:  Positive for voice change. Negative for congestion, ear pain and sinus pain.   Eyes:  Negative for pain.  Respiratory:  Positive for cough (COPD) and shortness of breath (COPD).   Cardiovascular:  Negative for chest pain.  Gastrointestinal:  Positive for abdominal pain (upper abdominal area). Negative for blood in stool, constipation, nausea and vomiting.  Genitourinary:  Negative for dysuria, frequency and urgency.  Musculoskeletal:  Negative for arthralgias and myalgias.  Neurological:  Positive for headaches (hx of migraines). Negative for weakness.  Psychiatric/Behavioral:  Negative for dysphoric mood and sleep disturbance. The patient is not nervous/anxious.        Objective:        01/10/2023    8:57 AM 01/01/2023    9:40 AM 12/03/2022    3:49 PM  Vitals with BMI  Height 5\' 0"  5\' 0"  5\' 0"   Weight 92 lbs 6 oz 90 lbs 13 oz 93 lbs  BMI 18.05 17.73 18.16  Systolic 122 138 161  Diastolic 60 64 70  Pulse 80 116 106    No data found.   Physical Exam Constitutional:      General: She is not in acute distress.    Appearance: Normal appearance.  Eyes:     Conjunctiva/sclera: Conjunctivae normal.  Cardiovascular:     Rate and Rhythm: Normal rate and regular rhythm.     Heart sounds: Normal heart sounds.  Pulmonary:     Effort: Pulmonary effort is normal.     Breath sounds: Normal breath sounds. No wheezing.  Abdominal:     General: Bowel sounds are normal.  Musculoskeletal:     Right elbow: Decreased range of motion. Tenderness present.     Left elbow: Normal.     Right forearm: Tenderness present.     Comments: Skin tear   Skin:    Findings: Abrasion (right forearm) and erythema present.  Neurological:     Mental Status: She is alert. Mental status is at baseline.  Psychiatric:        Mood and Affect: Mood normal.        Behavior: Behavior normal.     Health Maintenance Due  Topic Date Due   Hepatitis C Screening   Never done   Zoster Vaccines- Shingrix (2 of 2) 04/02/2022   Lung Cancer Screening  05/30/2022  Medicare Annual Wellness (AWV)  03/09/2023    There are no preventive care reminders to display for this patient.   Lab Results  Component Value Date   TSH 2.560 10/23/2022   Lab Results  Component Value Date   WBC 8.2 10/23/2022   HGB 10.7 (L) 10/23/2022   HCT 34.8 10/23/2022   MCV 92 10/23/2022   PLT 272 10/23/2022   Lab Results  Component Value Date   NA 145 (H) 10/23/2022   K 4.5 10/23/2022   CO2 19 (L) 10/23/2022   GLUCOSE 91 10/23/2022   BUN 20 10/23/2022   CREATININE 0.84 10/23/2022   BILITOT <0.2 10/23/2022   ALKPHOS 82 10/23/2022   AST 16 10/23/2022   ALT 10 10/23/2022   PROT 5.9 (L) 10/23/2022   ALBUMIN 4.1 10/23/2022   CALCIUM 9.3 10/23/2022   ANIONGAP 3 (L) 07/03/2016   EGFR 72 10/23/2022   Lab Results  Component Value Date   CHOL 171 09/19/2022   Lab Results  Component Value Date   HDL 49 09/19/2022   Lab Results  Component Value Date   LDLCALC 104 (H) 09/19/2022   Lab Results  Component Value Date   TRIG 100 09/19/2022   Lab Results  Component Value Date   CHOLHDL 3.5 09/19/2022   No results found for: "HGBA1C"     Assessment & Plan:  Fall, initial encounter -     DG ELBOW COMPLETE RIGHT (3+VIEW); Future -     DG Forearm Right; Future  Right arm pain Assessment & Plan: Acute Fall - 2 days ago Xray ordered of the right forearm and elbow. Rewrapped bandage on her right arm and applied triple antibiotic. Instructed patient to change her bandage at least once a day  Patient advised to follow-up if she has fever, purulent drainage, or increased redness and swelling at the site.   Orders: -     DG ELBOW COMPLETE RIGHT (3+VIEW); Future -     DG Forearm Right; Future  Moderate episode of recurrent major depressive disorder (HCC) Assessment & Plan: Recurrent Patient is currently on Zoloft 100 mg once daily. Add Vraylar 1.5 mg once  daily as adjunct therapy, 3 weeks of samples have been given to patient Plan: Patient may also need referral for counseling. FU in 3 weeks   Orders: -     Cariprazine HCl; Take 1 capsule (1.5 mg total) by mouth daily.  Dispense: 30 capsule; Refill: 0  Laryngitis, chronic Assessment & Plan: Chronic Not well-controlled, NSAID's, increase fluids, voice rest, and history of Z-Pak have not helped.  ENT referral ordered today  Orders: -     Ambulatory referral to ENT  Orthostatic hypotension Assessment & Plan: Patient requests refill on her midodrine 5 mg by mouth THREE TIMES A DAY with meals.   Orders: -     Midodrine HCl; Take 1 tablet (5 mg total) by mouth 3 (three) times daily with meals.  Dispense: 90 tablet; Refill: 1  Migraine aura without headache Assessment & Plan: Not well-controlled.   Continue Maxalt 10 mg once daily and amitriptyline 25 mg at bedtime  Referral to neurology, Dr. Allena Katz for migraine management   Orders: -     Ambulatory referral to Neurology     Meds ordered this encounter  Medications   cariprazine (VRAYLAR) 1.5 MG capsule    Sig: Take 1 capsule (1.5 mg total) by mouth daily.    Dispense:  30 capsule    Refill:  0   midodrine (  PROAMATINE) 5 MG tablet    Sig: Take 1 tablet (5 mg total) by mouth 3 (three) times daily with meals.    Dispense:  90 tablet    Refill:  1    Orders Placed This Encounter  Procedures   DG Elbow Complete Right   DG Forearm Right   Ambulatory referral to ENT   Ambulatory referral to Neurology     Follow-up: Return in about 20 days (around 01/30/2023) for keep appointment with Dr. [name] .  An After Visit Summary was printed and given to the patient.  Total time spent on today's visit was greater than 40 minutes, including both face-to-face time and nonface-to-face time personally spent on review of chart (labs and imaging), discussing labs and goals, discussing further work-up, treatment options, referrals to  specialist if needed, reviewing outside records if pertinent, answering patient's questions, and coordinating care.   Lajuana Matte, FNP [facility] [phone]

## 2023-01-10 NOTE — Assessment & Plan Note (Signed)
Recurrent Patient is currently on Zoloft 100 mg once daily. Add Vraylar 1.5 mg once daily as adjunct therapy, 3 weeks of samples have been given to patient Plan: Patient may also need referral for counseling. FU in 3 weeks

## 2023-01-10 NOTE — Assessment & Plan Note (Signed)
Patient requests refill on her midodrine 5 mg by mouth THREE TIMES A DAY with meals.

## 2023-01-10 NOTE — Assessment & Plan Note (Signed)
Acute Fall - 2 days ago Xray ordered of the right forearm and elbow. Rewrapped bandage on her right arm and applied triple antibiotic. Instructed patient to change her bandage at least once a day  Patient advised to follow-up if she has fever, purulent drainage, or increased redness and swelling at the site.

## 2023-01-10 NOTE — Assessment & Plan Note (Addendum)
Not well-controlled.   Continue Maxalt 10 mg once daily and amitriptyline 25 mg at bedtime  Referral to neurology, Dr. Allena Katz for migraine management

## 2023-01-10 NOTE — Assessment & Plan Note (Signed)
Chronic Not well-controlled, NSAID's, increase fluids, voice rest, and history of Z-Pak have not helped.  ENT referral ordered today

## 2023-01-11 ENCOUNTER — Other Ambulatory Visit: Payer: Self-pay | Admitting: Family Medicine

## 2023-01-11 ENCOUNTER — Other Ambulatory Visit: Payer: Medicare PPO

## 2023-01-11 DIAGNOSIS — J449 Chronic obstructive pulmonary disease, unspecified: Secondary | ICD-10-CM | POA: Diagnosis not present

## 2023-01-11 NOTE — Progress Notes (Signed)
   01/11/2023  Patient ID: Anne [name], female   DOB: 1945/12/20, 77 y.o.   MRN: [id number]  Patient outreach to schedule telephone visit to review medications and discuss adherence packaging and home delivery options.  Appointment scheduled for 11/15 at 1pm.  [name], PharmD, DPLA

## 2023-01-14 NOTE — Progress Notes (Unsigned)
Acute Office Visit  Subjective:    Patient ID: Anne [name], female    DOB: 05-May-1945, 77 y.o.   MRN: [id number]  No chief complaint on file.   HPI: Patient is in today for ***  Past Medical History:  Diagnosis Date   Acquired absence of both breasts and nipples 06/08/2015   Acute infection of nasal sinus 05/04/2021   Aortic atherosclerosis 01/30/2021   Asthma    daily inhalers   Ataxia 10/18/2020   Bilateral carpal tunnel syndrome 01/30/2021   Bipolar disorder, mixed 06/16/2021   Breast cancer of upper-inner quadrant of left female breast 04/15/2015   Bronchopneumonia 07/22/2022   Carotid artery disease 05/04/2016   Mild, 1-39% bilaterally by doppler in March 2018   Chronic pain in right shoulder 12/25/2020   COPD with acute exacerbation 07/03/2016   Dental crowns present    Drug induced constipation 04/10/2021   EIC (epidermal inclusion cyst) 03/21/2021   Family history of breast cancer in female 04/26/2015   Dx. Niece at 35; dx. Maternal aunt in her late 22s  Formatting of this note might be different from the original. Overview:  Dx. Niece at 80; dx. Maternal aunt in her late 36s   Family history of colon cancer 04/26/2015   Dx. In maternal grandfather in his late 45s-70  Formatting of this note might be different from the original. Overview:  Dx. In maternal grandfather in his late 22s-70   Frequent falls 10/18/2020   Genetic testing 06/02/2015   Negative for pathogenic mutations within any of 20 genes on the Breast/Ovarian Cancer Panel through [employer].  No variants of uncertain significance (VUSes) were found.  The Breast/Ovarian Cancer Panel offered by [facility] Basilio Cairo, MD) includes sequencing and deletion/duplication analysis for the following 19 genes:  ATM, BARD1, BRCA1, BRCA2, BRIP1, CDH1, CHEK2, FANCC, M   History of basal cell carcinoma 04/26/2015   History of concussion 01/01/2014   History of kidney stones    History of TIA  (transient ischemic attack) 10/2014   Hyperlipidemia    Insomnia    Lumbar back pain 10/18/2020   Major depressive disorder 10/18/2020   Malrotation of intestine 09/23/2017   Migraine aura without headache 02/08/2014   Migraine headache 01/30/2021   Nasal congestion 05/26/2015   will finish z-pak 05/27/2015   Nonproductive cough 05/26/2015   PONV (postoperative nausea and vomiting)    "long time ago"  none recently   Psychogenic syncope 04/23/2019   Right lumbar radiculopathy 04/23/2019   RLS (restless legs syndrome)    Simple chronic bronchitis 07/04/2019   Tobacco use    Tremor 10/18/2020   Vitamin D deficiency    Wears dentures    Weight loss 08/28/2019    Past Surgical History:  Procedure Laterality Date   ABDOMINAL HYSTERECTOMY     APPENDECTOMY  2000   BREAST BIOPSY Left    BREAST RECONSTRUCTION WITH PLACEMENT OF TISSUE EXPANDER AND FLEX HD (ACELLULAR HYDRATED DERMIS) Bilateral 06/02/2015   Procedure: BILATERAL BREAST RECONSTRUCTION WITH PLACEMENT OF TISSUE EXPANDER AND  ACELLULAR HYDRATED DERMIS;  Surgeon: Glenna Fellows, MD;  Location: [facility];  Service: Plastics;  Laterality: Bilateral;   CARPAL TUNNEL RELEASE Bilateral    CATARACT EXTRACTION Bilateral 09/2013   KNEE ARTHROSCOPY Left    LAPAROSCOPIC ABDOMINAL EXPLORATION N/A 10/2017   LAPAROSCOPIC LYSIS OF CONGENITAL ADHESIVE BAND/LADD'S BANDS WIH INTRAOPERATIVE CHOLANGIOGRAPHY. Perforation of small intestion requiring repair.    LOOP RECORDER INSERTION N/A 05/11/2016   Procedure: Loop  Recorder Insertion;  Surgeon: Will Jorja Loa, MD;  [facility];  Service: Cardiovascular;  Laterality: N/A;   MASTECTOMY W/ SENTINEL NODE BIOPSY Bilateral 06/02/2015   Procedure: BILATERAL MASTECTOMY WITH LEFT SENTINEL LYMPH NODE BIOPSY;  Surgeon: Almond Lint, MD;  Location: [facility];  Service: General;  Laterality: Bilateral;   NASAL SEPTUM SURGERY     x 3   PLANTAR FASCIA SURGERY  Left    REMOVAL OF BILATERAL TISSUE EXPANDERS WITH PLACEMENT OF BILATERAL BREAST IMPLANTS Bilateral 06/17/2015   Procedure: DEBRIDEMENT OF BILATERAL MASTECTOMY FLAP WITH BILATERAL TISSUE EXPANDER EXCHANGE ;  Surgeon: Glenna Fellows, MD;  Location: [facility];  Service: Plastics;  Laterality: Bilateral;   REMOVAL OF TISSUE EXPANDER Bilateral 07/12/2015   Procedure: REMOVAL OF BILATERAL TISSUE EXPANDERS;  Surgeon: Glenna Fellows, MD;  Location: [facility];  Service: Plastics;  Laterality: Bilateral;    Family History  Problem Relation Age of Onset   Cervical cancer Mother 33   Colon cancer Maternal Grandfather        mets to stomach; dx. 67-70   Heart disease Father    Prostate cancer Father 89   Cervical cancer Maternal Grandmother        dx. 59s; treated with radium implant   Lung cancer Sister 15       maternal half-sister dx. lung cancer, stage III   Breast cancer Maternal Aunt    Cervical cancer Sister 29       paternal half-sister; s/p TAH   Spina bifida Grandchild    Brain cancer Grandchild        grandson dx. at 20 mos, treated at [facility]   Breast cancer Other 51       maternal half-sister's daughter   Cancer Maternal Aunt        d. 77; unspecified type - "began at back area and moved to vital organs"   Cancer Maternal Uncle         late 31s; unspecified type; "started at back and moved to vital organs"    Social History   Socioeconomic History   Marital status: Widowed    Spouse name: Not on file   Number of children: 1   Years of education: 41   Highest education level: Not on file  Occupational History    Comment: [employer], retired  Tobacco Use   Smoking status: Every Day    Current packs/day: 0.25    Types: Cigarettes   Smokeless tobacco: Never   Tobacco comments:    Currently wearing nicotine patches  Vaping Use   Vaping status: Never Used  Substance and Sexual Activity   Alcohol use: Never   Drug use: No   Sexual activity: Not  Currently  Other Topics Concern   Not on file  Social History Narrative   Lives alone, widow   Caffeine use- tea, 1 cup daily   Right Handed    Lives in a one story [place] home    Social Determinants of Health   Financial Resource Strain: Low Risk  (02/17/2021)   Overall Financial Resource Strain (CARDIA)    Difficulty of Paying Living Expenses: Not hard at all  Food Insecurity: Food Insecurity Present (03/08/2022)   Hunger Vital Sign    Worried About Running Out of Food in the Last Year: Never true    Ran Out of Food in the Last Year: Sometimes true  Transportation Needs: No Transportation Needs (03/08/2022)   PRAPARE - Transportation  Lack of Transportation (Medical): No    Lack of Transportation (Non-Medical): No  Physical Activity: Inactive (03/08/2022)   Exercise Vital Sign    Days of Exercise per Week: 0 days    Minutes of Exercise per Session: 0 min  Stress: Stress Concern Present (03/08/2022)   [employer] of Occupational Health - Occupational Stress Questionnaire    Feeling of Stress : To some extent  Social Connections: Socially Isolated (05/17/2022)   Social Connection and Isolation Panel [NHANES]    Frequency of Communication with Friends and Family: Twice a week    Frequency of Social Gatherings with Friends and Family: Twice a week    Attends Religious Services: Never    Database administrator or Organizations: No    Attends Banker Meetings: Never    Marital Status: Divorced  Catering manager Violence: Not At Risk (05/17/2022)   Humiliation, Afraid, Rape, and Kick questionnaire    Fear of Current or Ex-Partner: No    Emotionally Abused: No    Physically Abused: No    Sexually Abused: No    Outpatient Medications Prior to Visit  Medication Sig Dispense Refill   albuterol (PROVENTIL) (2.5 MG/3ML) 0.083% nebulizer solution Take 2.5 mg by nebulization 4 (four) times daily.     albuterol (VENTOLIN HFA) 108 (90 Base) MCG/ACT inhaler INHALE 1-2 PUFFS  BY MOUTH into THE lungs every SIX hours AS NEEDED FOR WHEEZING AND/OR SHORTNESS OF BREATH 8.5 g 2   amitriptyline (ELAVIL) 25 MG tablet TAKE 1 TABLET (25 MG TOTAL) BY MOUTH AT BEDTIME. 90 tablet 3   atorvastatin (LIPITOR) 10 MG tablet TAKE 1 TABLET EVERY DAY 90 tablet 1   Budeson-Glycopyrrol-Formoterol (BREZTRI AEROSPHERE) 160-9-4.8 MCG/ACT AERO Inhale 2 puffs into the lungs 2 (two) times daily. 3 each 3   cariprazine (VRAYLAR) 1.5 MG capsule Take 1 capsule (1.5 mg total) by mouth daily. 30 capsule 0   Cyanocobalamin (B-12 COMPLIANCE INJECTION) 1000 MCG/ML KIT Inject 1 mL as directed every 30 (thirty) days.     donepezil (ARICEPT ODT) 10 MG disintegrating tablet Take 1 tablet (10 mg total) by mouth at bedtime. 90 tablet 1   famotidine (PEPCID) 20 MG tablet TAKE 1 TABLET TWICE DAILY 180 tablet 1   gabapentin (NEURONTIN) 300 MG capsule Take 1 capsule (300 mg total) by mouth 3 (three) times daily.     HYDROcodone-acetaminophen (NORCO/VICODIN) 5-325 MG tablet TAKE ONE TABLET BY MOUTH daily AS NEEDED FOR SEVERE pain (migranes) 30 tablet 0   megestrol (MEGACE) 20 MG tablet Take 2 tablets (40 mg total) by mouth daily. 180 tablet 0   melatonin 10 MG TABS Take 20 mg by mouth at bedtime. 1 tablet 0   meloxicam (MOBIC) 15 MG tablet TAKE 1 TABLET EVERY DAY 90 tablet 0   midodrine (PROAMATINE) 5 MG tablet Take 1 tablet (5 mg total) by mouth 3 (three) times daily with meals. 90 tablet 1   mirtazapine (REMERON) 45 MG tablet TAKE 1 TABLET AT BEDTIME 90 tablet 3   montelukast (SINGULAIR) 10 MG tablet TAKE 1 TABLET EVERY DAY 90 tablet 3   omeprazole (PRILOSEC) 40 MG capsule TAKE 1 CAPSULE TWICE DAILY 180 capsule 3   promethazine (PHENERGAN) 25 MG tablet Take 1 tablet (25 mg total) by mouth every 8 (eight) hours as needed for nausea or vomiting. 30 tablet 0   rizatriptan (MAXALT-MLT) 10 MG disintegrating tablet Take 1 tablet at onset of headache. May repeat 1 dose in 2 hours if needed. Max 2  tabs in 24 hours. 10  tablet 2   sertraline (ZOLOFT) 100 MG tablet TAKE 1 TABLET EVERY DAY 90 tablet 1   SYRINGE-NEEDLE, DISP, 3 ML (LUER LOCK SAFETY SYRINGES) 25G X 1" 3 ML MISC Inject 1 each into the muscle every 30 (thirty) days. 50 each 0   traZODone (DESYREL) 100 MG tablet Take 0.5 tablets (50 mg total) by mouth at bedtime as needed. for sleep     Vitamin D, Ergocalciferol, (DRISDOL) 1.25 MG (50000 UNIT) CAPS capsule Take 1 capsule (50,000 Units total) by mouth every 7 (seven) days. 12 capsule 1   Facility-Administered Medications Prior to Visit  Medication Dose Route Frequency Provider Last Rate Last Admin   ipratropium-albuterol (DUONEB) 0.5-2.5 (3) MG/3ML nebulizer solution 3 mL  3 mL Nebulization Once         Allergies  Allergen Reactions   Clindamycin/Lincomycin Nausea And Vomiting   Lyrica [Pregabalin] Other (See Comments)    PERSONALITY CHANGE    Review of Systems     Objective:        01/10/2023    8:57 AM 01/01/2023    9:40 AM 12/03/2022    3:49 PM  Vitals with BMI  Height 5\' 0"  5\' 0"  5\' 0"   Weight 92 lbs 6 oz 90 lbs 13 oz 93 lbs  BMI 18.05 17.73 18.16  Systolic 122 138 621  Diastolic 60 64 70  Pulse 80 116 106    No data found.   Physical Exam  Health Maintenance Due  Topic Date Due   Hepatitis C Screening  Never done   Zoster Vaccines- Shingrix (2 of 2) 04/02/2022   Lung Cancer Screening  05/30/2022   Medicare Annual Wellness (AWV)  03/09/2023    There are no preventive care reminders to display for this patient.   Lab Results  Component Value Date   TSH 2.560 10/23/2022   Lab Results  Component Value Date   WBC 8.2 10/23/2022   HGB 10.7 (L) 10/23/2022   HCT 34.8 10/23/2022   MCV 92 10/23/2022   PLT 272 10/23/2022   Lab Results  Component Value Date   NA 145 (H) 10/23/2022   K 4.5 10/23/2022   CO2 19 (L) 10/23/2022   GLUCOSE 91 10/23/2022   BUN 20 10/23/2022   CREATININE 0.84 10/23/2022   BILITOT <0.2 10/23/2022   ALKPHOS 82 10/23/2022   AST 16  10/23/2022   ALT 10 10/23/2022   PROT 5.9 (L) 10/23/2022   ALBUMIN 4.1 10/23/2022   CALCIUM 9.3 10/23/2022   ANIONGAP 3 (L) 07/03/2016   EGFR 72 10/23/2022   Lab Results  Component Value Date   CHOL 171 09/19/2022   Lab Results  Component Value Date   HDL 49 09/19/2022   Lab Results  Component Value Date   LDLCALC 104 (H) 09/19/2022   Lab Results  Component Value Date   TRIG 100 09/19/2022   Lab Results  Component Value Date   CHOLHDL 3.5 09/19/2022   No results found for: "HGBA1C"     Assessment & Plan:  There are no diagnoses linked to this encounter.   No orders of the defined types were placed in this encounter.   No orders of the defined types were placed in this encounter.    Follow-up: No follow-ups on file.  An After Visit Summary was printed and given to the patient.  Blane Ohara, MD [facility] [phone]

## 2023-01-15 ENCOUNTER — Encounter: Payer: Self-pay | Admitting: Family Medicine

## 2023-01-15 ENCOUNTER — Ambulatory Visit (INDEPENDENT_AMBULATORY_CARE_PROVIDER_SITE_OTHER): Payer: Medicare PPO | Admitting: Family Medicine

## 2023-01-15 VITALS — BP 98/62 | HR 99 | Temp 97.6°F | Ht 61.5 in | Wt 94.6 lb

## 2023-01-15 DIAGNOSIS — F02A18 Dementia in other diseases classified elsewhere, mild, with other behavioral disturbance: Secondary | ICD-10-CM

## 2023-01-15 DIAGNOSIS — R251 Tremor, unspecified: Secondary | ICD-10-CM

## 2023-01-15 DIAGNOSIS — G301 Alzheimer's disease with late onset: Secondary | ICD-10-CM | POA: Diagnosis not present

## 2023-01-15 MED ORDER — MEMANTINE HCL 5 MG PO TABS: 5.0000 mg | ORAL_TABLET | Freq: Two times a day (BID) | ORAL | 2 refills | Status: DC

## 2023-01-15 MED ORDER — PRIMIDONE 50 MG PO TABS: 25.0000 mg | ORAL_TABLET | Freq: Every day | ORAL | 0 refills | Status: DC

## 2023-01-15 NOTE — Patient Instructions (Signed)
Patient should not drive until assessed by physical therapy or the DMV.   Start namenda 5 mg twice daily for memory issues.   Start on primidone 50 mg 1/2 before bed for tremor.

## 2023-01-16 ENCOUNTER — Telehealth: Payer: Self-pay | Admitting: Family Medicine

## 2023-01-16 DIAGNOSIS — E559 Vitamin D deficiency, unspecified: Secondary | ICD-10-CM

## 2023-01-16 NOTE — Progress Notes (Signed)
   01/16/2023  Patient ID: Anne [name], female   DOB: 1945/09/19, 77 y.o.   MRN: [id number]  Outreach attempt to inform patient's son, Montinique Czarny [facility]), that I currently have a telephone visit scheduled with Orlie Pollen this Friday at 1pm to discuss adherence packaging and/or home delivery of medications.  I was not able to reach [facility] but did leave a HIPAA compliant voicemail with my direct number to call if appointment needs to be changed, so he can be present.  [name], PharmD, DPLA

## 2023-01-16 NOTE — Assessment & Plan Note (Signed)
Start namenda 5 mg twice daily.  Continue aricept 10 mg before bed.  I do not recommend the patient drives.  I am going to refer to [facility] for a supervised driving test.

## 2023-01-16 NOTE — Telephone Encounter (Signed)
Copied from CRM [id number]. Topic: Clinical - Medication Refill >> Jan 16, 2023  2:12 PM Desma Mcgregor wrote: Most Recent Primary Care Visit:  Provider: [facility], KIRSTEN  Department[facility]  Visit Type: OFFICE VISIT  Date: 01/15/2023  Medication: Cyanocobalamin (B-12 COMPLIANCE INJECTION) 1000 MCG/ML KIT  Has the patient contacted their pharmacy? Yes (Agent: If no, request that the patient contact the pharmacy for the refill. If patient does not wish to contact the pharmacy document the reason why and proceed with request.) (Agent: If yes, when and what did the pharmacy advise?) No rx has been sent in for this med  Is this the correct pharmacy for this prescription? Yes If no, delete pharmacy and type the correct one.  This is the patient's preferred pharmacy:    [facility] [address] [place] - [address] [facility] [place] [address] Phone: [phone] Fax: [phone]  [facility] - [place], [place] - [address] [place] [address] Phone: [phone] Fax: [phone]   Has the prescription been filled recently? No  Is the patient out of the medication? Yes  Has the patient been seen for an appointment in the last year OR does the patient have an upcoming appointment? Yes  Can we respond through MyChart? No  Agent: Please be advised that Rx refills may take up to 3 business days. We ask that you follow-up with your pharmacy.

## 2023-01-16 NOTE — Telephone Encounter (Signed)
Copied from CRM [id number]. Topic: Clinical - Medication Refill >> Jan 16, 2023  2:27 PM Desma Mcgregor wrote: Most Recent Primary Care Visit:  Provider: [facility], KIRSTEN  Department: [facility]  Visit Type: OFFICE VISIT  Date: 01/15/2023  Medication: VITAMIN E  Has the patient contacted their pharmacy? Yes (Agent: If no, request that the patient contact the pharmacy for the refill. If patient does not wish to contact the pharmacy document the reason why and proceed with request.) (Agent: If yes, when and what did the pharmacy advise?)  No Rx on file  Is this the correct pharmacy for this prescription? Yes If no, delete pharmacy and type the correct one.  This is the patient's preferred pharmacy:   [facility] [address] [place] - [address] [facility] [place] [address] Phone: [phone] Fax: [phone]  Has the prescription been filled recently? No  Is the patient out of the medication? Yes  Has the patient been seen for an appointment in the last year OR does the patient have an upcoming appointment? Yes  Can we respond through MyChart? No  Agent: Please be advised that Rx refills may take up to 3 business days. We ask that you follow-up with your pharmacy.

## 2023-01-16 NOTE — Assessment & Plan Note (Signed)
Start primidone 50 mg 1/2 oral before bed.

## 2023-01-18 ENCOUNTER — Other Ambulatory Visit: Payer: Self-pay

## 2023-01-18 DIAGNOSIS — E559 Vitamin D deficiency, unspecified: Secondary | ICD-10-CM

## 2023-01-18 DIAGNOSIS — F331 Major depressive disorder, recurrent, moderate: Secondary | ICD-10-CM

## 2023-01-18 MED ORDER — VITAMIN D (ERGOCALCIFEROL) 1.25 MG (50000 UNIT) PO CAPS: 50000.0000 [IU] | ORAL_CAPSULE | ORAL | 1 refills | Status: DC

## 2023-01-18 MED ORDER — CARIPRAZINE HCL 1.5 MG PO CAPS: 1.5000 mg | ORAL_CAPSULE | Freq: Every day | ORAL | 0 refills | Status: DC

## 2023-01-18 MED ORDER — B-12 COMPLIANCE INJECTION 1000 MCG/ML IJ KIT: 1.0000 mL | PACK | INTRAMUSCULAR | 6 refills | Status: DC

## 2023-01-18 NOTE — Telephone Encounter (Signed)
Copied from CRM [id number]. Topic: Clinical - Prescription Issue >> Jan 17, 2023  4:24 PM Prudencio Pair wrote: Reason for CRM: Patient is stating that she has called earlier in regards to her medications that the pharmacy has not yet received. Pt states she is needing her 3 prescriptions for the vitamins to be sent to her pharmacy, [facility]. She provided the number for [facility]-[phone]. Pt also states if Dr could prescribe her something for sore throat that would be find as well. Also needing her Vraylar 1.5mg  sent as well to pharmacy. Pt states to call her. CB # [id number]. She has already picked up her syringes but just need the 3 vitamin prescriptions along with Vraylar sent to pharmacy.

## 2023-01-18 NOTE — Progress Notes (Unsigned)
   01/18/2023  Patient ID: Anne [name], female   DOB: 27-Jan-1946, 77 y.o.   MRN: [id number]  S/O Telephone visit for medication review, so patient can get established with [facility] for adherence packaging and home delivery  Medication Management -Reviewed medication list with patient and identified the following variances:  -Patient unable to afford [name] but currently has samples on hand- medication assistance team is working on PAP -Patient is not currently taking rizatriptan but may need if not approved for [name] PAP -Trazodone appears to have been increased to 100mg  at bedtime, but prescription reflects 50mg - new prescription needed -Eliquis therapy was discontinued, but patient has continued to take -Gabapentin changed to 300mg  TID, but patient continued to take 600mg  TID -Amitriptyline decreased to 25mg  at bedtime, but patient continued to take 75mg  -Patient does not have albuterol rescue in haler on hand- needs refill -Patient is not taking Abilify but is taking quetiapine 100mg  at bedtime -Marina Gravel through [employer]Me PAP- enrollment ends 03/05/23 -Taking megestrol 20mg  1 tablet BID versus 2 tablets daily -Patient is not currently taking Namenda or Aricept -Taking 2.5mg  midodrine TID versus currently prescribed 5mg  TID -Patient also has Norflex and methocarbamol at home she is taking as needed for muscle spasms  A/P  Medication Management -Patient made aware to stop Eliquis -I also discussed dose changes of trazodone, gabapentin, amitriptyline, and midodrine and will verify [facility] gets/fills updated doses to replace what patient currently has at home -Consulting Dr. [name] to clarify if patient should be taking quetiapine 100mg  at bedtime.  It appears this was replacing Abilify per previous chart notes -Verifying that patient should be taking Memantine 5mg  BID and Donepezil ODT 10mg  hs -Clarifying if patient should be taking Norflex or methocarbamol PRN -Will complete [employer]Me  PAP renewal for 2025 so patient can continue to receive Breztri at no cost.  I will fax form to Dr. [name], so patient can sign at upcoming appointment on 11/27.  This can then be faxed into [employer] at [phone] -Albuterol rescue inhaler sent to [facility] 11/19  Follow-up:  Once active medications clarified, I will coordinate with [facility] PharmD's and PCP to get prescriptions to set up adherence packaging and home delivery  [name], PharmD, DPLA

## 2023-01-21 ENCOUNTER — Telehealth: Payer: Self-pay

## 2023-01-21 ENCOUNTER — Ambulatory Visit: Payer: Medicare PPO | Admitting: Physician Assistant

## 2023-01-21 ENCOUNTER — Other Ambulatory Visit: Payer: Self-pay | Admitting: Family Medicine

## 2023-01-21 NOTE — Telephone Encounter (Signed)
I called the patient today about the virtual appointment that was scheduled today at 1:20 PM. The patient was notified that Per Marianne Sofia, PA, this appointment would need to be in person and that she would need to preferably f.up with her provider. The patient is requesting to have her driving privileges back. Per patient, "Dr. [name] has grounded me until 01/30/2023 until I can be reevaluated. This has not been done with the DMV, this is just between provider and patient."  The patient stated that she is an inconvenience to her son and his wife. They have their own child and lives. Anne [name] is wanting to be able to drive to/from the doctor and [facility].

## 2023-01-21 NOTE — Telephone Encounter (Addendum)
Patient son made aware.  ----- Message from Blane Ohara sent at 01/16/2023 10:34 PM EST ----- Regarding: referral to [facility]. Please call patient's son and let him know I spoke with physical therapy and they suggested she have a supervised driving test by [facility].

## 2023-01-22 NOTE — Telephone Encounter (Signed)
The patient is aware that she will need to wait until her appointment with Dr. [name] to discuss this.  The  Division Of Motor Vehicles Request For Driver Re-Examination, to my knowledge this paper was not filled out. The paper was emailed to Dr. [name] by Florentina Addison, CMA and it was printed out.  Katie, CMA has printed another copy and I have put this on your desk.  Please advise.

## 2023-01-22 NOTE — Telephone Encounter (Signed)
I called the patient to notify her that Dr. Faylene Kurtz has signed the Driver Re-examination form and it will be faxed in to the [facility]. The patient asked if we could hold off on faxing this until she sees Dr. [name] this upcoming week. I spoke with Dr. Faylene Kurtz who stated that it is fine to hold off on faxing this until the patient sees Dr. [name] again. Patient notified.

## 2023-01-23 ENCOUNTER — Telehealth: Payer: Self-pay

## 2023-01-23 NOTE — Telephone Encounter (Signed)
PAP application for (breztri, [name] (AZ[employer]ME and [employer]))  has been faxed to Dr. [name] for pt. to sign at 11/27 appointment.  Pt. Will sign at her 11/27 appt.

## 2023-01-23 NOTE — Telephone Encounter (Signed)
PAP application for (drug name and company name)  has been mailed to pt home. I will fax PCP pages once I receive pt pages

## 2023-01-23 NOTE — Progress Notes (Unsigned)
Pharmacy Medication Assistance Program Note    01/23/2023  Patient ID: AMEKA LINFORD, female  DOB: 03-May-1945, 77 y.o.  MRN:  [id number]     01/23/2023  Outreach Medication Two  Initial Outreach Date (Medication Two) 01/23/2023  Manufacturer Medication Two [facility] Other [facility]  [facility] Bretztri  Dose of Breztri 160MCG/9MCG/4.8MCG  Type of Radiographer, therapeutic Assistance  Date Application Sent to Prescriber 01/23/2023  Name of Prescriber KIRSTEN COX       Signature

## 2023-01-28 ENCOUNTER — Ambulatory Visit: Payer: Medicare PPO | Admitting: Family Medicine
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
## 2023-01-30 ENCOUNTER — Encounter: Payer: Self-pay | Admitting: Family Medicine

## 2023-01-30 ENCOUNTER — Ambulatory Visit (INDEPENDENT_AMBULATORY_CARE_PROVIDER_SITE_OTHER): Payer: Medicare PPO | Admitting: Family Medicine

## 2023-01-30 ENCOUNTER — Telehealth: Payer: Self-pay

## 2023-01-30 ENCOUNTER — Other Ambulatory Visit (HOSPITAL_COMMUNITY): Payer: Self-pay

## 2023-01-30 ENCOUNTER — Other Ambulatory Visit: Payer: Self-pay

## 2023-01-30 VITALS — BP 110/62 | HR 98 | Temp 97.3°F | Ht 61.5 in | Wt 91.0 lb

## 2023-01-30 DIAGNOSIS — F332 Major depressive disorder, recurrent severe without psychotic features: Secondary | ICD-10-CM

## 2023-01-30 DIAGNOSIS — R251 Tremor, unspecified: Secondary | ICD-10-CM

## 2023-01-30 DIAGNOSIS — G301 Alzheimer's disease with late onset: Secondary | ICD-10-CM

## 2023-01-30 DIAGNOSIS — F331 Major depressive disorder, recurrent, moderate: Secondary | ICD-10-CM

## 2023-01-30 DIAGNOSIS — F02A18 Dementia in other diseases classified elsewhere, mild, with other behavioral disturbance: Secondary | ICD-10-CM

## 2023-01-30 MED ORDER — PRIMIDONE 50 MG PO TABS
50.0000 mg | ORAL_TABLET | Freq: Every day | ORAL | 0 refills | Status: DC
  Filled 2023-01-30: qty 90, 90d supply, fill #0

## 2023-01-30 MED ORDER — MEMANTINE HCL 10 MG PO TABS
10.0000 mg | ORAL_TABLET | Freq: Two times a day (BID) | ORAL | 2 refills | Status: DC
  Filled 2023-01-30: qty 60, 30d supply, fill #0

## 2023-01-30 MED ORDER — SERTRALINE HCL 100 MG PO TABS
200.0000 mg | ORAL_TABLET | Freq: Every day | ORAL | 0 refills | Status: DC
  Filled 2023-01-30: qty 180, 90d supply, fill #0

## 2023-01-30 NOTE — Telephone Encounter (Signed)
Spoke with patient made her aware from the note from today visit that Dr. [name] will wait on the results from driving test before she okay her to start driving again. Patient stated she will call DMV today and get appointment for driving test.  Copied from CRM [id number]. Topic: General - Other >> Jan 30, 2023 12:22 PM Amy B wrote: Reason for CRM: Patient requests a call back to discuss issues regarding her ability to drive, [phone].
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
## 2023-02-02 NOTE — Assessment & Plan Note (Signed)
Increase Zoloft to 200mg  daily and may consider resuming Abilify if there is no improvement.  Social Isolation Patient is feeling isolated due to inability to drive and lack of social interaction. Patient is resistant to the idea of assisted living or having a personal care service attendant. -Encourage patient to explore options for social interaction within current limitations, such as making new friends or reconnecting with old ones. -Consider referral to social services for additional support if isolation continues to impact mental health.

## 2023-02-02 NOTE — Assessment & Plan Note (Signed)
The current medical regimen is effective;  continue present plan and medications.

## 2023-02-03 NOTE — Assessment & Plan Note (Signed)
Increased Primidone the dose to 25mg  at night.

## 2023-02-04 ENCOUNTER — Other Ambulatory Visit: Payer: Self-pay | Admitting: Family Medicine

## 2023-02-04 ENCOUNTER — Other Ambulatory Visit: Payer: Self-pay

## 2023-02-04 DIAGNOSIS — E559 Vitamin D deficiency, unspecified: Secondary | ICD-10-CM

## 2023-02-04 DIAGNOSIS — G43109 Migraine with aura, not intractable, without status migrainosus: Secondary | ICD-10-CM

## 2023-02-04 DIAGNOSIS — F331 Major depressive disorder, recurrent, moderate: Secondary | ICD-10-CM

## 2023-02-04 DIAGNOSIS — F03A3 Unspecified dementia, mild, with mood disturbance: Secondary | ICD-10-CM

## 2023-02-04 DIAGNOSIS — E43 Unspecified severe protein-calorie malnutrition: Secondary | ICD-10-CM

## 2023-02-04 DIAGNOSIS — E782 Mixed hyperlipidemia: Secondary | ICD-10-CM

## 2023-02-04 DIAGNOSIS — I951 Orthostatic hypotension: Secondary | ICD-10-CM

## 2023-02-04 DIAGNOSIS — F332 Major depressive disorder, recurrent severe without psychotic features: Secondary | ICD-10-CM

## 2023-02-04 MED ORDER — OMEPRAZOLE 40 MG PO CPDR
40.0000 mg | DELAYED_RELEASE_CAPSULE | Freq: Two times a day (BID) | ORAL | 3 refills | Status: DC
  Filled 2023-02-04 – 2023-04-18 (×8): qty 60, 30d supply, fill #0

## 2023-02-04 MED ORDER — ALBUTEROL SULFATE HFA 108 (90 BASE) MCG/ACT IN AERS
1.0000 | INHALATION_SPRAY | Freq: Four times a day (QID) | RESPIRATORY_TRACT | 3 refills | Status: DC | PRN
  Filled 2023-02-04 – 2023-02-08 (×3): qty 18, 25d supply, fill #0
  Filled 2023-02-08: qty 18, 28d supply, fill #0
  Filled 2023-02-18: qty 18, 25d supply, fill #0

## 2023-02-04 MED ORDER — ALBUTEROL SULFATE (2.5 MG/3ML) 0.083% IN NEBU
2.5000 mg | INHALATION_SOLUTION | Freq: Four times a day (QID) | RESPIRATORY_TRACT | 1 refills | Status: AC
  Filled 2023-02-04 – 2023-03-19 (×3): qty 360, 30d supply, fill #0

## 2023-02-04 MED ORDER — SERTRALINE HCL 100 MG PO TABS
200.0000 mg | ORAL_TABLET | Freq: Every day | ORAL | 3 refills | Status: DC
  Filled 2023-02-04 – 2023-03-18 (×6): qty 60, 30d supply, fill #0

## 2023-02-04 MED ORDER — RIZATRIPTAN BENZOATE 10 MG PO TBDP
ORAL_TABLET | ORAL | 2 refills | Status: DC
  Filled 2023-02-04: qty 10, 5d supply, fill #0
  Filled 2023-02-06: qty 10, 30d supply, fill #0
  Filled ????-??-??: fill #0

## 2023-02-04 MED ORDER — MELOXICAM 15 MG PO TABS
15.0000 mg | ORAL_TABLET | Freq: Every day | ORAL | 3 refills | Status: DC
  Filled 2023-02-04 – 2023-02-22 (×5): qty 30, 30d supply, fill #0
  Filled ????-??-??: fill #0

## 2023-02-04 MED ORDER — CARIPRAZINE HCL 1.5 MG PO CAPS
1.5000 mg | ORAL_CAPSULE | Freq: Every day | ORAL | 0 refills | Status: DC
  Filled 2023-02-04 – 2023-04-18 (×7): qty 30, 30d supply, fill #0

## 2023-02-04 MED ORDER — CYANOCOBALAMIN 1000 MCG/ML IJ SOLN
1000.0000 ug | INTRAMUSCULAR | 6 refills | Status: DC
  Filled 2023-02-04 – 2023-03-19 (×2): qty 1, fill #0
  Filled 2023-03-19 – 2023-05-27 (×4): qty 1, 30d supply, fill #0

## 2023-02-04 MED ORDER — FAMOTIDINE 20 MG PO TABS
20.0000 mg | ORAL_TABLET | Freq: Two times a day (BID) | ORAL | 1 refills | Status: DC
  Filled 2023-02-04: qty 180, 90d supply, fill #0
  Filled 2023-02-05: qty 60, 30d supply, fill #0
  Filled 2023-02-06 – 2023-04-18 (×6): qty 180, 90d supply, fill #0

## 2023-02-04 MED ORDER — MEGESTROL ACETATE 40 MG PO TABS
40.0000 mg | ORAL_TABLET | Freq: Every day | ORAL | 3 refills | Status: DC
  Filled 2023-02-04 – 2023-02-14 (×4): qty 30, 30d supply, fill #0
  Filled ????-??-??: fill #1

## 2023-02-04 MED ORDER — MEMANTINE HCL 10 MG PO TABS
10.0000 mg | ORAL_TABLET | Freq: Two times a day (BID) | ORAL | 3 refills | Status: DC
  Filled 2023-02-04 – 2023-02-14 (×4): qty 60, 30d supply, fill #0
  Filled 2023-02-22 – 2023-03-18 (×3): qty 60, 30d supply, fill #1

## 2023-02-04 MED ORDER — MIDODRINE HCL 5 MG PO TABS
5.0000 mg | ORAL_TABLET | Freq: Three times a day (TID) | ORAL | 3 refills | Status: DC
  Filled 2023-02-04 – 2023-02-22 (×5): qty 90, 30d supply, fill #0
  Filled ????-??-??: fill #0

## 2023-02-04 MED ORDER — METHOCARBAMOL 750 MG PO TABS
750.0000 mg | ORAL_TABLET | Freq: Two times a day (BID) | ORAL | 3 refills | Status: DC
  Filled 2023-02-04 – 2023-04-18 (×8): qty 60, 30d supply, fill #0

## 2023-02-04 MED ORDER — PROMETHAZINE HCL 25 MG PO TABS
25.0000 mg | ORAL_TABLET | Freq: Three times a day (TID) | ORAL | 0 refills | Status: DC | PRN
  Filled 2023-02-04 – 2023-05-27 (×5): qty 90, 30d supply, fill #0

## 2023-02-04 MED ORDER — DONEPEZIL HCL 10 MG PO TBDP
10.0000 mg | ORAL_TABLET | Freq: Every day | ORAL | 1 refills | Status: DC
  Filled 2023-02-04: qty 90, 90d supply, fill #0
  Filled 2023-02-05: qty 30, 30d supply, fill #0
  Filled 2023-02-13: qty 90, 90d supply, fill #0
  Filled 2023-02-14: qty 30, 30d supply, fill #0
  Filled 2023-03-18 – 2023-04-01 (×3): qty 30, 30d supply, fill #1
  Filled 2023-04-18 – 2023-04-24 (×3): qty 30, 30d supply, fill #2
  Filled 2023-04-29: qty 30, 30d supply, fill #3
  Filled ????-??-??: fill #3

## 2023-02-04 MED ORDER — MIRTAZAPINE 45 MG PO TABS
45.0000 mg | ORAL_TABLET | Freq: Every day | ORAL | 3 refills | Status: DC
  Filled 2023-02-04 – 2023-02-14 (×4): qty 30, 30d supply, fill #0
  Filled 2023-02-22 – 2023-04-01 (×5): qty 30, 30d supply, fill #1
  Filled 2023-04-18 – 2023-04-24 (×3): qty 30, 30d supply, fill #2
  Filled 2023-04-29 – 2023-05-27 (×5): qty 30, 30d supply, fill #3

## 2023-02-04 MED ORDER — ATORVASTATIN CALCIUM 10 MG PO TABS
10.0000 mg | ORAL_TABLET | Freq: Every day | ORAL | 1 refills | Status: DC
  Filled 2023-02-04: qty 90, 90d supply, fill #0
  Filled 2023-02-05: qty 30, 30d supply, fill #0
  Filled 2023-02-06 – 2023-03-19 (×4): qty 90, 90d supply, fill #0

## 2023-02-04 MED ORDER — GABAPENTIN 300 MG PO CAPS: 300.0000 mg | ORAL_CAPSULE | Freq: Three times a day (TID) | ORAL | Status: DC

## 2023-02-04 MED ORDER — TRAZODONE HCL 50 MG PO TABS
50.0000 mg | ORAL_TABLET | Freq: Every evening | ORAL | 3 refills | Status: DC | PRN
  Filled 2023-02-04 – 2023-03-11 (×3): qty 30, 30d supply, fill #0
  Filled ????-??-??: fill #0

## 2023-02-04 MED ORDER — PRIMIDONE 50 MG PO TABS
50.0000 mg | ORAL_TABLET | Freq: Every day | ORAL | 0 refills | Status: DC
  Filled 2023-02-04: qty 90, 90d supply, fill #0
  Filled 2023-02-05: qty 30, 30d supply, fill #0
  Filled 2023-02-06 – 2023-02-22 (×3): qty 90, 90d supply, fill #0
  Filled ????-??-??: fill #0

## 2023-02-04 MED ORDER — VITAMIN D (ERGOCALCIFEROL) 1.25 MG (50000 UNIT) PO CAPS
50000.0000 [IU] | ORAL_CAPSULE | ORAL | 3 refills | Status: DC
  Filled 2023-02-04 – 2023-02-14 (×4): qty 4, 28d supply, fill #0
  Filled 2023-03-19 – 2023-05-27 (×4): qty 4, 28d supply, fill #1

## 2023-02-04 MED ORDER — AMITRIPTYLINE HCL 25 MG PO TABS
25.0000 mg | ORAL_TABLET | Freq: Every day | ORAL | 3 refills | Status: DC
  Filled 2023-02-04 – 2023-04-18 (×8): qty 30, 30d supply, fill #0

## 2023-02-04 MED ORDER — MONTELUKAST SODIUM 10 MG PO TABS
10.0000 mg | ORAL_TABLET | Freq: Every day | ORAL | 3 refills | Status: DC
  Filled 2023-02-04 – 2023-05-27 (×12): qty 30, 30d supply, fill #0

## 2023-02-04 NOTE — Telephone Encounter (Signed)
PAP: Application for Anne [name] has been submitted to PAP Companies: [employer], via fax  PAP: Application for Anne [name] has been submitted to PAP Companies: [employer], via fax

## 2023-02-05 ENCOUNTER — Other Ambulatory Visit: Payer: Self-pay

## 2023-02-05 ENCOUNTER — Other Ambulatory Visit (HOSPITAL_COMMUNITY): Payer: Self-pay

## 2023-02-06 ENCOUNTER — Other Ambulatory Visit: Payer: Self-pay

## 2023-02-06 ENCOUNTER — Telehealth: Payer: Self-pay

## 2023-02-06 ENCOUNTER — Other Ambulatory Visit (HOSPITAL_COMMUNITY): Payer: Self-pay

## 2023-02-06 NOTE — Telephone Encounter (Signed)
Patient informed that Dr. [name] only requested that she be re-evaluated with a driving test. She should only need to call and schedule an appointment with the [facility]. Patient verbalized understanding.   Copied from CRM [id number]. Topic: Clinical - Medical Advice >> Feb 06, 2023  1:58 PM Mosetta Putt H wrote: Reason for CRM: requesting call back, wants to know what dr cox told dmv that requires her to take another driver test

## 2023-02-06 NOTE — Telephone Encounter (Signed)
2025 Patient Assistance for Markus Daft has been approved through 03/04/2024

## 2023-02-07 ENCOUNTER — Other Ambulatory Visit: Payer: Self-pay

## 2023-02-08 ENCOUNTER — Other Ambulatory Visit: Payer: Self-pay

## 2023-02-08 NOTE — Progress Notes (Signed)
   02/08/2023  Patient ID: Anne [name], female   DOB: [birth year], 77 y.o.   MRN: [id number]  Patient outreach to follow-up on medication access/adherence.  -[facility] is now handling patients prescriptions, and she is set up for compliance packaging and home delivery  -Dr. [name] sent order for all active prescriptions to [facility] last week -The pharmacy is currently filling and getting ready to deliver the following medications:  donepezil, megesterol, memantine, mirtazepine, Vitamin D, and Vraylar.  All other medications are too soon to fill on insurance at this time. -Reviewed ALL current medications with patient.  Out of the prescriptions coming to her, she already has megesterol and memantine at home; but they are for different doses that currently prescribed.  Educated patient to STOP taking these medications when packaged meds from [facility] arrive. -Patient and I also went over any dosing changes related to other medications she had at home that are too soon to fill, including: Sertraline increased to 200mg   Primidone increased to 50mg   Trazodone decreased to 50mg   -Patient made note of changes on her medication list and voiced understanding -Informed her Qlipta and Breztri PAP are being worked on, so she will hopefully be able to receive these medications at no cost -Patient currently has cough/SOB, but states she has and is using Breztri and albuterol inhalers.  Albuterol inhaler will go through on insurance again 12/16, and pharmacy will fill and send to her again. -Informed her that it may take another cycle or two until all medications are included in her adherence packaging based on when insurance will cover refills -I will monitor this monthly until everything is properly synced from [facility]  [name], PharmD, DPLA

## 2023-02-10 DIAGNOSIS — J449 Chronic obstructive pulmonary disease, unspecified: Secondary | ICD-10-CM | POA: Diagnosis not present

## 2023-02-11 ENCOUNTER — Other Ambulatory Visit: Payer: Self-pay

## 2023-02-12 ENCOUNTER — Ambulatory Visit: Payer: Medicare PPO | Admitting: Family Medicine

## 2023-02-12 ENCOUNTER — Other Ambulatory Visit: Payer: Self-pay

## 2023-02-12 ENCOUNTER — Other Ambulatory Visit (HOSPITAL_COMMUNITY): Payer: Self-pay

## 2023-02-12 ENCOUNTER — Other Ambulatory Visit (HOSPITAL_BASED_OUTPATIENT_CLINIC_OR_DEPARTMENT_OTHER): Payer: Self-pay

## 2023-02-13 ENCOUNTER — Other Ambulatory Visit: Payer: Self-pay

## 2023-02-13 ENCOUNTER — Other Ambulatory Visit (HOSPITAL_COMMUNITY): Payer: Self-pay

## 2023-02-14 ENCOUNTER — Other Ambulatory Visit: Payer: Self-pay

## 2023-02-15 DIAGNOSIS — J384 Edema of larynx: Secondary | ICD-10-CM | POA: Diagnosis not present

## 2023-02-15 DIAGNOSIS — K117 Disturbances of salivary secretion: Secondary | ICD-10-CM | POA: Diagnosis not present

## 2023-02-15 DIAGNOSIS — R490 Dysphonia: Secondary | ICD-10-CM | POA: Diagnosis not present

## 2023-02-15 DIAGNOSIS — F17200 Nicotine dependence, unspecified, uncomplicated: Secondary | ICD-10-CM | POA: Diagnosis not present

## 2023-02-18 ENCOUNTER — Other Ambulatory Visit (HOSPITAL_COMMUNITY): Payer: Self-pay

## 2023-02-18 ENCOUNTER — Other Ambulatory Visit: Payer: Self-pay

## 2023-02-19 NOTE — Telephone Encounter (Signed)
Spoke with Mikle Bosworth at [employer]  Patients application was not received.  Re-faxed completed application to [facility] for review.

## 2023-02-21 NOTE — Telephone Encounter (Signed)
PAP: Patient assistance application for Bennie Pierini has been approved by PAP Companies: [employer] from 03/06/2023 to 03/04/2024. Medication should be delivered to PAP Delivery: Home For further shipping updates, please contact [employer] at [phone]

## 2023-02-21 NOTE — Progress Notes (Signed)
Pharmacy Medication Assistance Program Note    02/21/2023  Patient ID: ALISEN FREDRICKSON, female  DOB: August 08, 1945, 77 y.o.  MRN:  [id number]     01/23/2023  Outreach Medication Two  Initial Outreach Date (Medication Two) 01/23/2023  [facility] Bretztri  Dose of Breztri 160MCG/9MCG/4.8MCG  Type of Radiographer, therapeutic Assistance  Date Application Sent to Prescriber 01/23/2023  Name of Prescriber KIRSTEN COX  Date Application Received From Patient 02/04/2023  Application Items Received From Patient Application  Date Application Received From Provider 02/04/2023  Method Application Sent to Manufacturer Fax  Date Application Submitted to Manufacturer 02/04/2023  Patient Assistance Determination Approved  Approval Start Date 03/06/2023  Patient Notification Method Telephone Call  Telephone Call Outcome Successful      02/21/2023  Patient ID: Lonia Skinner, female   DOB: 06/09/1945, 77 y.o.   MRN: [id number]     02/21/2023  Outreach Medication One  Manufacturer Medication One [facility]  Type of Assistance Manufacturer Assistance  Date Application Sent to Patient 01/30/2023  Application Items Requested Application;Proof of Income  Date Application Sent to Prescriber 03/01/2023  Name of Prescriber Kirsten Cox  Date Application Received From Patient 03/07/2023  Application Items Received From Patient Application;Proof of Income  Date Application Received From Provider 03/07/2023  Date Application Submitted to Manufacturer 03/07/2023  Method Application Sent to Manufacturer Fax  Patient Assistance Determination Approved  Approval Start Date 03/06/2023  Approval End Date 03/04/2024  Patient Notification Method Telephone Call  Telephone Call Outcome Successful     Signature  Tresea Mall, CPHT/Patient [facility] Direct Line: [phone] Fax: [phone]

## 2023-02-22 ENCOUNTER — Other Ambulatory Visit: Payer: Self-pay

## 2023-02-28 ENCOUNTER — Ambulatory Visit: Payer: Medicare PPO | Admitting: Family Medicine

## 2023-03-01 ENCOUNTER — Ambulatory Visit: Payer: Medicare PPO | Admitting: Family Medicine

## 2023-03-02 ENCOUNTER — Other Ambulatory Visit: Payer: Self-pay | Admitting: Family Medicine

## 2023-03-09 ENCOUNTER — Other Ambulatory Visit: Payer: Self-pay | Admitting: Family Medicine

## 2023-03-11 ENCOUNTER — Other Ambulatory Visit: Payer: Self-pay

## 2023-03-11 ENCOUNTER — Other Ambulatory Visit (HOSPITAL_COMMUNITY): Payer: Self-pay

## 2023-03-11 MED ORDER — GABAPENTIN 300 MG PO CAPS
300.0000 mg | ORAL_CAPSULE | Freq: Three times a day (TID) | ORAL | 5 refills | Status: DC
  Filled 2023-03-11 – 2023-04-01 (×4): qty 90, 30d supply, fill #0
  Filled 2023-04-18 – 2023-04-24 (×3): qty 90, 30d supply, fill #1
  Filled 2023-04-29: qty 90, 30d supply, fill #2
  Filled ????-??-??: fill #2

## 2023-03-11 NOTE — Progress Notes (Signed)
   03/11/2023  Patient ID: Anne [name], female   DOB: [birth year], 78 y.o.   MRN: [id number]  Patient set up with [facility] for adherence packaging and home delivery.  Contacted pharmacy to check on progress in getting all prescriptions on the same fill schedule, so everything can be packaged and sent to patient at the same time.  Patient has complex medication regimen that has caused confusion and adherence issues previously.  [facility] states all maintenance medications are scheduled to be processed and packaged 1/14; these should be delivered to patient's home the following day.  Note on patient's profile states the pharmacy tried to contact her regarding copays and needing billing information, but pharmacy technician states this appears to have been resolved.  It still does not appear a gabapentin  order has been sent to the pharmacy, and a meloxicam  order was recently sent to [facility] by Dr. Sherre.  Coordinating with her to see if and order for gabapentin  needs to be sent to [facility] and see if meloxicam  was sent to [facility] versus [facility] intentionally.  Will contact patient to inform of all above information and verify she has provided pharmacy with billing information, so processing of her prescriptions is not delayed.

## 2023-03-12 NOTE — Progress Notes (Signed)
   03/12/2023  Patient ID: Anne [name], female   DOB: 06-10-1945, 78 y.o.   MRN: [id number]  Covering physician for PCP sent a new prescription for gabapentin  to [facility], and this is now in process with patient's other medications.  Attempted to contact patient to inform about medications being filled and anticipated delivery, but I was not able to reach her.  I was also not able to leave a voicemail for her to return my call, so I will try to call again later this week.  [name], PharmD, DPLA

## 2023-03-13 ENCOUNTER — Ambulatory Visit: Payer: Medicare HMO

## 2023-03-13 ENCOUNTER — Other Ambulatory Visit: Payer: Self-pay

## 2023-03-13 ENCOUNTER — Telehealth: Payer: Self-pay

## 2023-03-13 VITALS — BP 122/68 | HR 88 | Ht 61.25 in | Wt 89.6 lb

## 2023-03-13 DIAGNOSIS — Z23 Encounter for immunization: Secondary | ICD-10-CM | POA: Diagnosis not present

## 2023-03-13 DIAGNOSIS — Z0000 Encounter for general adult medical examination without abnormal findings: Secondary | ICD-10-CM | POA: Diagnosis not present

## 2023-03-13 NOTE — Progress Notes (Signed)
   03/13/2023  Patient ID: Anne [name], female   DOB: May 16, 1945, 78 y.o.   MRN: [id number]  Patient returned my call from yesterday, but when I missed her call.  Attempted to connect with her today but was not able to reach her or leave a voicemail.  I will try to call her back Friday if I do not hear from her before.  [name], PharmD, DPLA

## 2023-03-13 NOTE — Progress Notes (Signed)
 Subjective:   Anne [name] is a 78 y.o. female who presents for Medicare Annual (Subsequent) preventive examination.  This wellness visit is conducted by a nurse.  The patient's medications were reviewed and reconciled since the patient's last visit.  History details were provided by the patient.  The history appears to be reliable.    Medical History: Patient history and Family history was reviewed  Medications, Allergies, and preventative health maintenance was reviewed and updated.   Visit Complete: In person  Cardiac Risk Factors include: advanced age (>93men, >35 women);smoking/ tobacco exposure    Objective:    Today's Vitals   03/13/23 1354  BP: 122/68  Pulse: 88  Weight: 89 lb 9.6 oz (40.6 kg)  Height: 5' 1.25 (1.556 m)  PainSc: 0-No pain   Body mass index is 16.79 kg/m.     03/08/2021   10:17 AM 07/03/2016   12:40 AM 05/11/2016    3:00 PM 07/04/2015    1:13 PM 06/02/2015    6:39 AM 05/26/2015   12:08 PM 12/02/2014    1:58 PM  Advanced Directives  Does Patient Have a Medical Advance Directive? Yes No No No No No No  Type of Estate Agent of [place];Living will;Out of facility DNR (pink MOST or yellow form)        Would patient like information on creating a medical advance directive?  No - Patient declined Yes (MAU/Ambulatory/Procedural Areas - Information given)  No - patient declined information No - patient declined information Yes - Educational materials given    Current Medications (verified) Outpatient Encounter Medications as of 03/13/2023  Medication Sig   albuterol  (PROVENTIL ) (2.5 MG/3ML) 0.083% nebulizer solution Take 3 mLs (2.5 mg total) by nebulization 4 (four) times daily.   albuterol  (VENTOLIN  HFA) 108 (90 Base) MCG/ACT inhaler INHALE 1 TO 2 PUFFS INTO LUNGS EVERY 6 HOURS AS NEEDED FOR  WHEEZING  AND/OR  SHORTNESS  OF  BREATH   amitriptyline  (ELAVIL ) 25 MG tablet Take 1 tablet (25 mg total) by mouth at bedtime.   Atogepant  ([name] )  60 MG TABS Take by mouth.   atorvastatin  (LIPITOR) 10 MG tablet Take 1 tablet (10 mg total) by mouth daily.   Budeson-Glycopyrrol-Formoterol (BREZTRI  AEROSPHERE) 160-9-4.8 MCG/ACT AERO Inhale 2 puffs into the lungs 2 (two) times daily.   cariprazine  (VRAYLAR ) 1.5 MG capsule Take 1 capsule (1.5 mg total) by mouth daily.   Cyanocobalamin  (B-12 COMPLIANCE INJECTION) 1000 MCG/ML KIT Inject 1 mL as directed every 30 (thirty) days.   donepezil  (ARICEPT  ODT) 10 MG disintegrating tablet Take 1 tablet (10 mg total) by mouth at bedtime.   famotidine  (PEPCID ) 20 MG tablet Take 1 tablet (20 mg total) by mouth 2 (two) times daily.   gabapentin  (NEURONTIN ) 300 MG capsule Take 1 capsule (300 mg total) by mouth 3 (three) times daily.   HYDROcodone -acetaminophen  (NORCO/VICODIN) 5-325 MG tablet TAKE ONE TABLET BY MOUTH daily AS NEEDED FOR SEVERE pain (migranes)   megestrol  (MEGACE ) 40 MG tablet Take 1 tablet (40 mg total) by mouth daily.   meloxicam  (MOBIC ) 15 MG tablet TAKE 1 TABLET EVERY DAY   memantine  (NAMENDA ) 10 MG tablet Take 1 tablet (10 mg total) by mouth 2 (two) times daily.   methocarbamol  (ROBAXIN ) 750 MG tablet Take 1 tablet (750 mg total) by mouth in the morning and at bedtime.   midodrine  (PROAMATINE ) 5 MG tablet Take 1 tablet (5 mg total) by mouth 3 (three) times daily with meals.   mirtazapine  (REMERON )  45 MG tablet Take 1 tablet (45 mg total) by mouth at bedtime.   montelukast  (SINGULAIR ) 10 MG tablet Take 1 tablet (10 mg total) by mouth daily.   omeprazole  (PRILOSEC) 40 MG capsule Take 1 capsule (40 mg total) by mouth 2 (two) times daily.   primidone  (MYSOLINE ) 50 MG tablet TAKE 1/2 (ONE-HALF) TABLET BY MOUTH AT BEDTIME   promethazine  (PHENERGAN ) 25 MG tablet Take 1 tablet (25 mg total) by mouth every 8 (eight) hours as needed for nausea or vomiting.   rizatriptan  (MAXALT -MLT) 10 MG disintegrating tablet Take 1 tablet at onset of headache. May repeat 1 dose in 2 hours if needed. Max 2 tabs in 24  hours.   sertraline  (ZOLOFT ) 100 MG tablet Take 2 tablets (200 mg total) by mouth daily.   SYRINGE-NEEDLE, DISP, 3 ML (LUER LOCK SAFETY SYRINGES) 25G X 1 3 ML MISC Inject 1 each into the muscle every 30 (thirty) days.   traZODone  (DESYREL ) 50 MG tablet Take 1 tablet (50 mg total) by mouth at bedtime as needed. for sleep   Vitamin D , Ergocalciferol , (DRISDOL ) 1.25 MG (50000 UNIT) CAPS capsule Take 1 capsule (50,000 Units total) by mouth every 7 (seven) days.   No facility-administered encounter medications on file as of 03/13/2023.    Allergies (verified) Clindamycin /lincomycin and Lyrica [pregabalin]   History: Past Medical History:  Diagnosis Date   Acquired absence of both breasts and nipples 06/08/2015   Acute infection of nasal sinus 05/04/2021   Aortic atherosclerosis 01/30/2021   Asthma    daily inhalers   Ataxia 10/18/2020   Bilateral carpal tunnel syndrome 01/30/2021   Bipolar disorder, mixed 06/16/2021   Breast cancer of upper-inner quadrant of left female breast 04/15/2015   Bronchopneumonia 07/22/2022   Carotid artery disease 05/04/2016   Mild, 1-39% bilaterally by doppler in March 2018   Chronic pain in right shoulder 12/25/2020   COPD with acute exacerbation 07/03/2016   Dental crowns present    Drug induced constipation 04/10/2021   EIC (epidermal inclusion cyst) 03/21/2021   Family history of breast cancer in female 04/26/2015   Dx. Niece at 36; dx. Maternal aunt in her late 28s  Formatting of this note might be different from the original. Overview:  Dx. Niece at 74; dx. Maternal aunt in her late 36s   Family history of colon cancer 04/26/2015   Dx. In maternal grandfather in his late 24s-70  Formatting of this note might be different from the original. Overview:  Dx. In maternal grandfather in his late 45s-70   Frequent falls 10/18/2020   Genetic testing 06/02/2015   Negative for pathogenic mutations within any of 20 genes on the Breast/Ovarian Cancer Panel  through [facility].  No variants of uncertain significance (VUSes) were found.  The Breast/Ovarian Cancer Panel offered by [facility] Angelena, MD) includes sequencing and deletion/duplication analysis for the following 19 genes:  ATM, BARD1, BRCA1, BRCA2, BRIP1, CDH1, CHEK2, FANCC, M   History of basal cell carcinoma 04/26/2015   History of concussion 01/01/2014   History of kidney stones    History of TIA (transient ischemic attack) 10/2014   Hyperlipidemia    Insomnia    Lumbar back pain 10/18/2020   Major depressive disorder 10/18/2020   Malrotation of intestine 09/23/2017   Migraine aura without headache 02/08/2014   Migraine headache 01/30/2021   Nasal congestion 05/26/2015   will finish z-pak 05/27/2015   Nonproductive cough 05/26/2015   PONV (postoperative nausea and vomiting)    long  time ago  none recently   Psychogenic syncope 04/23/2019   Right lumbar radiculopathy 04/23/2019   RLS (restless legs syndrome)    Simple chronic bronchitis 07/04/2019   Tobacco use    Tremor 10/18/2020   Vitamin D  deficiency    Wears dentures    Weight loss 08/28/2019   Past Surgical History:  Procedure Laterality Date   ABDOMINAL HYSTERECTOMY     APPENDECTOMY  2000   BREAST BIOPSY Left    BREAST RECONSTRUCTION WITH PLACEMENT OF TISSUE EXPANDER AND FLEX HD (ACELLULAR HYDRATED DERMIS) Bilateral 06/02/2015   Procedure: BILATERAL BREAST RECONSTRUCTION WITH PLACEMENT OF TISSUE EXPANDER AND  ACELLULAR HYDRATED DERMIS;  Surgeon: Earlis Ranks, MD;  Location: [facility];  Service: Plastics;  Laterality: Bilateral;   CARPAL TUNNEL RELEASE Bilateral    CATARACT EXTRACTION Bilateral 09/2013   KNEE ARTHROSCOPY Left    LAPAROSCOPIC ABDOMINAL EXPLORATION N/A 10/2017   LAPAROSCOPIC LYSIS OF CONGENITAL ADHESIVE BAND/LADD'S BANDS WIH INTRAOPERATIVE CHOLANGIOGRAPHY. Perforation of small intestion requiring repair.    LOOP RECORDER INSERTION N/A 05/11/2016    Procedure: Loop Recorder Insertion;  Surgeon: Will Gladis Norton, MD;  Location: [facility];  Service: Cardiovascular;  Laterality: N/A;   MASTECTOMY W/ SENTINEL NODE BIOPSY Bilateral 06/02/2015   Procedure: BILATERAL MASTECTOMY WITH LEFT SENTINEL LYMPH NODE BIOPSY;  Surgeon: Jina Nephew, MD;  Location: [facility];  Service: General;  Laterality: Bilateral;   NASAL SEPTUM SURGERY     x 3   PLANTAR FASCIA SURGERY Left    REMOVAL OF BILATERAL TISSUE EXPANDERS WITH PLACEMENT OF BILATERAL BREAST IMPLANTS Bilateral 06/17/2015   Procedure: DEBRIDEMENT OF BILATERAL MASTECTOMY FLAP WITH BILATERAL TISSUE EXPANDER EXCHANGE ;  Surgeon: Earlis Ranks, MD;  Location: [facility];  Service: Plastics;  Laterality: Bilateral;   REMOVAL OF TISSUE EXPANDER Bilateral 07/12/2015   Procedure: REMOVAL OF BILATERAL TISSUE EXPANDERS;  Surgeon: Earlis Ranks, MD;  Location: [facility];  Service: Plastics;  Laterality: Bilateral;   Family History  Problem Relation Age of Onset   Cervical cancer Mother 55   Colon cancer Maternal Grandfather        mets to stomach; dx. 67-70   Heart disease Father    Prostate cancer Father 79   Cervical cancer Maternal Grandmother        dx. 73s; treated with radium implant   Lung cancer Sister 32       maternal half-sister dx. lung cancer, stage III   Breast cancer Maternal Aunt    Cervical cancer Sister 3       paternal half-sister; s/p TAH   Spina bifida Grandchild    Brain cancer Grandchild        grandson dx. at 20 mos, treated at [facility]   Breast cancer Other 51       maternal half-sister's daughter   Cancer Maternal Aunt        d. 66; unspecified type - began at back area and moved to vital organs   Cancer Maternal Uncle         late 71s; unspecified type; started at back and moved to vital organs   Social History   Socioeconomic History   Marital status: Widowed    Spouse name: Not on file   Number of children: 1   Years of  education: 49   Highest education level: Not on file  Occupational History    Comment: [employer], retired  Tobacco Use   Smoking status: Every Day  Current packs/day: 0.25    Types: Cigarettes   Smokeless tobacco: Never  Vaping Use   Vaping status: Never Used  Substance and Sexual Activity   Alcohol use: Never   Drug use: No   Sexual activity: Not Currently  Other Topics Concern   Not on file  Social History Narrative   Lives alone, widow   Caffeine use- tea, 1 cup daily   Right Handed    Lives in a one story [place] home    Social Drivers of Health   Financial Resource Strain: Low Risk  (03/13/2023)   Overall Financial Resource Strain (CARDIA)    Difficulty of Paying Living Expenses: Not hard at all  Food Insecurity: No Food Insecurity (03/13/2023)   Hunger Vital Sign    Worried About Running Out of Food in the Last Year: Never true    Ran Out of Food in the Last Year: Never true  Transportation Needs: No Transportation Needs (03/13/2023)   PRAPARE - Administrator, Civil Service (Medical): No    Lack of Transportation (Non-Medical): No  Physical Activity: Inactive (03/13/2023)   Exercise Vital Sign    Days of Exercise per Week: 0 days    Minutes of Exercise per Session: 0 min  Stress: No Stress Concern Present (03/13/2023)   [employer] of Occupational Health - Occupational Stress Questionnaire    Feeling of Stress : Only a little  Social Connections: Socially Isolated (03/13/2023)   Social Connection and Isolation Panel [NHANES]    Frequency of Communication with Friends and Family: Twice a week    Frequency of Social Gatherings with Friends and Family: Twice a week    Attends Religious Services: Never    Database Administrator or Organizations: No    Attends Engineer, Structural: Never    Marital Status: Divorced    Tobacco Counseling Ready to quit: Not Answered Counseling given: Not Answered   Clinical Intake:  Pre-visit  preparation completed: Yes  Pain : No/denies pain Pain Score: 0-No pain     BMI - recorded: 16.79 Nutritional Status: BMI <19  Underweight Nutritional Risks: None Diabetes: No  How often do you need to have someone help you when you read instructions, pamphlets, or other written materials from your doctor or pharmacy?: 3 - Sometimes  Interpreter Needed?: No      Activities of Daily Living    03/13/2023    2:02 PM  In your present state of health, do you have any difficulty performing the following activities:  Hearing? 0  Vision? 0  Difficulty concentrating or making decisions? 1  Walking or climbing stairs? 0  Dressing or bathing? 0  Doing errands, shopping? 1  Preparing Food and eating ? N  Using the Toilet? N  In the past six months, have you accidently leaked urine? N  Do you have problems with loss of bowel control? N  Managing your Medications? N  Managing your Finances? N  Housekeeping or managing your Housekeeping? N    Patient Care Team: Sherre Clapper, MD as PCP - General (Internal Medicine) Patel, Donika K, DO as Consulting Physician (Neurology) Chodri, Tanvir, MD as Referring Physician (Specialist)  Indicate any recent Medical Services you may have received from other than [facility] providers in the past year (date may be approximate).     Assessment:   This is a routine wellness examination for Anne [name].  Hearing/Vision screen No results found.   Goals Addressed   None  Depression Screen    03/13/2023    2:27 PM 01/30/2023    2:10 PM 01/10/2023   10:19 AM 01/10/2023   10:18 AM 01/01/2023    9:46 AM 11/26/2022    3:42 PM 10/23/2022    9:52 AM  PHQ 2/9 Scores  PHQ - 2 Score 4 4 5 5 2 2 4   PHQ- 9 Score 11 12 14 7 7 13 14     Fall Risk    03/13/2023    2:01 PM 01/30/2023    2:10 PM 01/10/2023   10:20 AM 01/01/2023    9:45 AM 11/26/2022    3:42 PM  Fall Risk   Falls in the past year? 1 1 1  0 0  Number falls in past yr: 0 0 1  0  Injury with Fall? 0  0 1 0 0  Risk for fall due to : Impaired balance/gait Impaired balance/gait Impaired vision History of fall(s) Impaired balance/gait  Follow up Falls evaluation completed;Education provided Falls evaluation completed Falls evaluation completed Falls evaluation completed;Falls prevention discussed Falls evaluation completed;Falls prevention discussed    MEDICARE RISK AT HOME: Medicare Risk at Home Any stairs in or around the home?: No If so, are there any without handrails?: No Home free of loose throw rugs in walkways, pet beds, electrical cords, etc?: Yes Adequate lighting in your home to reduce risk of falls?: Yes Life alert?: No Use of a cane, walker or w/c?: No Grab bars in the bathroom?: No Shower chair or bench in shower?: No Elevated toilet seat or a handicapped toilet?: No  TIMED UP AND GO:  Was the test performed?  Yes  Length of time to ambulate 10 feet: 6 sec Gait slow and steady without use of assistive device    Cognitive Function:    09/20/2021    2:46 PM 02/14/2021   10:50 AM 10/13/2020    2:51 PM 10/03/2020    8:39 AM 04/14/2020    9:54 AM  MMSE - Mini Mental State Exam  Orientation to time 4 5 5 5 5   Orientation to time comments  2022, Winter, 12-12, Monday     Orientation to Place 5 5 5 5 5   Orientation to Place-comments  Dr Lawerance, [place], [place], [place], [place]     Registration 3 3 3 3 3   Registration-comments  [place], [place], [place]     Attention/ Calculation 5 5 5 5 5   Attention/Calculation-comments  Spell WORLD backwards: [facility]     Recall 1 2 3 1 2   Recall-comments  [place], [place], ?     Language- name 2 objects 2 2 2 2 2   Language- name 2 objects-comments  Pen, Watch     Language- repeat 1 1 1 1 1   Language- repeat-comments  No If's, and's or But's     Language- follow 3 step command 3 2 3 3 3   Language- follow 3 step command-comments  Pick paper up with right hand (used her left), fold in half and place on bed.     Language- read & follow direction 1 1 1 1  1   Language-read & follow direction-comments  Close your eyes.     Write a sentence 1 1 1 1 1   Write a sentence-comments  I love my son!     Copy design 1 1 1 1 1   Total score 27 28 30 28 29       03/08/2021   11:00 AM  Montreal Cognitive Assessment   Visuospatial/ Executive (0/5) 2  Naming (0/3) 3  Attention: Read list of digits (0/2) 2  Attention: Read list of letters (0/1) 1  Attention: Serial 7 subtraction starting at 100 (0/3) 3  Language: Repeat phrase (0/2) 1  Language : Fluency (0/1) 0  Abstraction (0/2) 0  Delayed Recall (0/5) 3  Orientation (0/6) 6  Total 21  Adjusted Score (based on education) 22      03/13/2023    2:37 PM 03/08/2022    2:56 PM  6CIT Screen  What Year? 0 points 0 points  What month? 0 points 0 points  What time? 0 points 0 points  Count back from 20 0 points 0 points  Months in reverse 0 points 0 points  Repeat phrase 0 points 0 points  Total Score 0 points 0 points    Immunizations Immunization History  Administered Date(s) Administered   Fluad Trivalent(High Dose 65+) 11/09/2022   Influenza-Unspecified 11/04/2018, 12/07/2019, 02/05/2022   Moderna SARS-COV2 Booster Vaccination 03/01/2020, 10/29/2021   Moderna Sars-Covid-2 Vaccination 05/06/2019, 06/03/2019   PNEUMOCOCCAL CONJUGATE-20 12/16/2020   PPD Test 04/11/2022   Pfizer(Comirnaty)Fall Seasonal Vaccine 12 years and older 01/01/2023, 03/13/2023   Pneumococcal Conjugate-13 09/02/2013   Pneumococcal Polysaccharide-23 12/06/2012, 12/10/2016   Respiratory Syncytial Virus Vaccine,Recomb Aduvanted(Arexvy) 10/29/2021   Tdap 09/25/2015   Zoster Recombinant(Shingrix) 02/05/2022   Zoster, Live 10/30/2009, 08/03/2013    TDAP status: Up to date  Flu Vaccine status: Up to date  Pneumococcal vaccine status: Up to date  Covid-19 vaccine status: Completed vaccines  Qualifies for Shingles Vaccine? Yes   Zostavax completed Yes   Shingrix Completed?: No.    Education has been provided  regarding the importance of this vaccine. Patient has been advised to call insurance company to determine out of pocket expense if they have not yet received this vaccine. Advised may also receive vaccine at local pharmacy or Health Dept. Verbalized acceptance and understanding.  Screening Tests Health Maintenance  Topic Date Due   Hepatitis C Screening  Never done   Zoster Vaccines- Shingrix (2 of 2) 04/02/2022   Lung Cancer Screening  05/30/2022   Medicare Annual Wellness (AWV)  03/09/2023   COVID-19 Vaccine (5 - 2024-25 season) 05/08/2023   DTaP/Tdap/Td (2 - Td or Tdap) 09/24/2025   Pneumonia Vaccine 42+ Years old  Completed   INFLUENZA VACCINE  Completed   DEXA SCAN  Completed   HPV VACCINES  Aged Out   Colonoscopy  Discontinued    Health Maintenance  Health Maintenance Due  Topic Date Due   Hepatitis C Screening  Never done   Zoster Vaccines- Shingrix (2 of 2) 04/02/2022   Lung Cancer Screening  05/30/2022   Medicare Annual Wellness (AWV)  03/09/2023    Colorectal cancer screening: No longer required.   Mammogram status: No longer required  Lung Cancer Screening: (Low Dose CT Chest recommended if Age 69-80 years, 20 pack-year currently smoking OR have quit w/in 15years.) does qualify.   Additional Screening:  Vision Screening: Recommended annual ophthalmology exams for early detection of glaucoma and other disorders of the eye. Is the patient up to date with their annual eye exam?  No   Dental Screening: Recommended annual dental exams for proper oral hygiene   Community Resource Referral / Chronic Care Management: CRR required this visit?  No   CCM required this visit?  No     Plan:     I have personally reviewed and noted the following in the patient's chart:   Medical and social history Use of  alcohol, tobacco or illicit drugs  Current medications and supplements including opioid prescriptions.  Functional ability and status Nutritional  status Physical activity Advanced directives List of other physicians Hospitalizations, surgeries, and ER visits in previous 12 months Vitals Screenings to include cognitive, depression, and falls Referrals and appointments  In addition, I have reviewed and discussed with patient certain preventive protocols, quality metrics, and best practice recommendations. A written personalized care plan for preventive services as well as general preventive health recommendations were provided to patient.     Suzen CHRISTELLA Sharps, LPN   10/03/7972

## 2023-03-13 NOTE — Patient Instructions (Signed)
 Ms. Anne [name] , Thank you for taking time to come for your Medicare Wellness Visit. I appreciate your ongoing commitment to your health goals. Please review the following plan we discussed and let me know if I can assist you in the future.    This is a list of the screening recommended for you and due dates:  Health Maintenance  Topic Date Due   Zoster (Shingles) Vaccine (2 of 2) 04/02/2022   Screening for Lung Cancer  05/30/2022   COVID-19 Vaccine (4 - 2024-25 season) Fall 2025   Medicare Annual Wellness Visit  03/09/2023   DTaP/Tdap/Td vaccine (2 - Td or Tdap) 09/24/2025   Pneumonia Vaccine  Completed   Flu Shot  Completed   DEXA scan (bone density measurement)  Completed   HPV Vaccine  Aged Out   Colon Cancer Screening  Discontinued    Preventive Care 65 Years and Older, Female Preventive care refers to lifestyle choices and visits with your health care provider that can promote health and wellness. What does preventive care include? A yearly physical exam. This is also called an annual well check. Dental exams once or twice a year. Routine eye exams. Ask your health care provider how often you should have your eyes checked. Personal lifestyle choices, including: Daily care of your teeth and gums. Regular physical activity. Eating a healthy diet. Avoiding tobacco and drug use. Limiting alcohol use. Practicing safe sex. Taking low-dose aspirin  every day. Taking vitamin and mineral supplements as recommended by your health care provider. What happens during an annual well check? The services and screenings done by your health care provider during your annual well check will depend on your age, overall health, lifestyle risk factors, and family history of disease. Counseling  Your health care provider may ask you questions about your: Alcohol use. Tobacco use. Drug use. Emotional well-being. Home and relationship well-being. Sexual activity. Eating habits. History of  falls. Memory and ability to understand (cognition). Work and work astronomer. Reproductive health. Screening  You may have the following tests or measurements: Height, weight, and BMI. Blood pressure. Lipid and cholesterol levels. These may be checked every 5 years, or more frequently if you are over 70 years old. Skin check. Lung cancer screening. You may have this screening every year starting at age 46 if you have a 30-pack-year history of smoking and currently smoke or have quit within the past 15 years. Fecal occult blood test (FOBT) of the stool. You may have this test every year starting at age 48. Flexible sigmoidoscopy or colonoscopy. You may have a sigmoidoscopy every 5 years or a colonoscopy every 10 years starting at age 65. Hepatitis C blood test. Hepatitis B blood test. Sexually transmitted disease (STD) testing. Diabetes screening. This is done by checking your blood sugar (glucose) after you have not eaten for a while (fasting). You may have this done every 1-3 years. Bone density scan. This is done to screen for osteoporosis. You may have this done starting at age 23. Mammogram. This may be done every 1-2 years. Talk to your health care provider about how often you should have regular mammograms. Talk with your health care provider about your test results, treatment options, and if necessary, the need for more tests. Vaccines  Your health care provider may recommend certain vaccines, such as: Influenza vaccine. This is recommended every year. Tetanus, diphtheria, and acellular pertussis (Tdap, Td) vaccine. You may need a Td booster every 10 years. Zoster vaccine. You may need this after  age 35. Pneumococcal 13-valent conjugate (PCV13) vaccine. One dose is recommended after age 14. Pneumococcal polysaccharide (PPSV23) vaccine. One dose is recommended after age 64. Talk to your health care provider about which screenings and vaccines you need and how often you need  them. This information is not intended to replace advice given to you by your health care provider. Make sure you discuss any questions you have with your health care provider. Document Released: 03/18/2015 Document Revised: 11/09/2015 Document Reviewed: 12/21/2014 Elsevier Interactive Patient Education  2017 [employer].  Fall Prevention in the Home Falls can cause injuries. They can happen to people of all ages. There are many things you can do to make your home safe and to help prevent falls. What can I do on the outside of my home? Regularly fix the edges of walkways and driveways and fix any cracks. Remove anything that might make you trip as you walk through a door, such as a raised step or threshold. Trim any bushes or trees on the path to your home. Use bright outdoor lighting. Clear any walking paths of anything that might make someone trip, such as rocks or tools. Regularly check to see if handrails are loose or broken. Make sure that both sides of any steps have handrails. Any raised decks and porches should have guardrails on the edges. Have any leaves, snow, or ice cleared regularly. Use sand or salt on walking paths during winter. Clean up any spills in your garage right away. This includes oil or grease spills. What can I do in the bathroom? Use night lights. Install grab bars by the toilet and in the tub and shower. Do not use towel bars as grab bars. Use non-skid mats or decals in the tub or shower. If you need to sit down in the shower, use a plastic, non-slip stool. Keep the floor dry. Clean up any water that spills on the floor as soon as it happens. Remove soap buildup in the tub or shower regularly. Attach bath mats securely with double-sided non-slip rug tape. Do not have throw rugs and other things on the floor that can make you trip. What can I do in the bedroom? Use night lights. Make sure that you have a light by your bed that is easy to reach. Do not use  any sheets or blankets that are too big for your bed. They should not hang down onto the floor. Have a firm chair that has side arms. You can use this for support while you get dressed. Do not have throw rugs and other things on the floor that can make you trip. What can I do in the kitchen? Clean up any spills right away. Avoid walking on wet floors. Keep items that you use a lot in easy-to-reach places. If you need to reach something above you, use a strong step stool that has a grab bar. Keep electrical cords out of the way. Do not use floor polish or wax that makes floors slippery. If you must use wax, use non-skid floor wax. Do not have throw rugs and other things on the floor that can make you trip. What can I do with my stairs? Do not leave any items on the stairs. Make sure that there are handrails on both sides of the stairs and use them. Fix handrails that are broken or loose. Make sure that handrails are as long as the stairways. Check any carpeting to make sure that it is firmly attached to the stairs.  Fix any carpet that is loose or worn. Avoid having throw rugs at the top or bottom of the stairs. If you do have throw rugs, attach them to the floor with carpet tape. Make sure that you have a light switch at the top of the stairs and the bottom of the stairs. If you do not have them, ask someone to add them for you. What else can I do to help prevent falls? Wear shoes that: Do not have high heels. Have rubber bottoms. Are comfortable and fit you well. Are closed at the toe. Do not wear sandals. If you use a stepladder: Make sure that it is fully opened. Do not climb a closed stepladder. Make sure that both sides of the stepladder are locked into place. Ask someone to hold it for you, if possible. Clearly mark and make sure that you can see: Any grab bars or handrails. First and last steps. Where the edge of each step is. Use tools that help you move around (mobility aids)  if they are needed. These include: Canes. Walkers. Scooters. Crutches. Turn on the lights when you go into a dark area. Replace any light bulbs as soon as they burn out. Set up your furniture so you have a clear path. Avoid moving your furniture around. If any of your floors are uneven, fix them. If there are any pets around you, be aware of where they are. Review your medicines with your doctor. Some medicines can make you feel dizzy. This can increase your chance of falling. Ask your doctor what other things that you can do to help prevent falls. This information is not intended to replace advice given to you by your health care provider. Make sure you discuss any questions you have with your health care provider. Document Released: 12/16/2008 Document Revised: 07/28/2015 Document Reviewed: 03/26/2014 Elsevier Interactive Patient Education  2017 [employer].

## 2023-03-15 ENCOUNTER — Other Ambulatory Visit: Payer: Self-pay

## 2023-03-15 ENCOUNTER — Other Ambulatory Visit: Payer: Self-pay | Admitting: Family Medicine

## 2023-03-15 DIAGNOSIS — E782 Mixed hyperlipidemia: Secondary | ICD-10-CM

## 2023-03-15 NOTE — Progress Notes (Signed)
   03/15/2023  Patient ID: Anne [name], female   DOB: Dec 14, 1945, 78 y.o.   MRN: [id number]  Patient outreach to follow-up with patient regarding adherence packaging and home delivery of medications.  Informed patient of all medications currently in process at [facility] that should be delivered the middle of next week.  She notes needing refills on syringe/needle used for B12 injections and primidone  25mg  daily.  I have pended orders for these for Dr. Sherre to sign if in agreement.  Patient recently received meloxicam  from mail order pharmacy, and I advised these be used as needed.  All maintenance medications will be included in adherence packaging this cycle of fills and moving forward.  Patient expressed concern with recent fatigue and also mentioned she has been w/o Vraylar  due to cost; the timing of these seem to line up.  I recommended patient contact [facility] to check on copay of Vraylar  and notify me if this is not going to be affordable for her.  Likely that patient was in donut hole at end of 2024, so hopefully copay will be affordable now.  Anne [name], PharmD, DPLA

## 2023-03-17 ENCOUNTER — Other Ambulatory Visit: Payer: Self-pay | Admitting: Family Medicine

## 2023-03-17 DIAGNOSIS — I951 Orthostatic hypotension: Secondary | ICD-10-CM

## 2023-03-17 MED ORDER — PRIMIDONE 50 MG PO TABS
25.0000 mg | ORAL_TABLET | Freq: Every day | ORAL | 1 refills | Status: DC
  Filled 2023-03-17: qty 45, 90d supply, fill #0

## 2023-03-17 MED ORDER — LUER LOCK SAFETY SYRINGES 25G X 1" 3 ML MISC
1.0000 | 0 refills | Status: AC
  Filled 2023-03-17 – 2023-05-21 (×2): qty 3, 90d supply, fill #0
  Filled 2023-05-23: qty 1, 30d supply, fill #0
  Filled 2023-05-27: qty 50, fill #0

## 2023-03-17 MED ORDER — MIDODRINE HCL 5 MG PO TABS
5.0000 mg | ORAL_TABLET | Freq: Three times a day (TID) | ORAL | 1 refills | Status: DC
  Filled 2023-03-17 – 2023-03-19 (×2): qty 90, 30d supply, fill #0

## 2023-03-18 ENCOUNTER — Other Ambulatory Visit: Payer: Self-pay

## 2023-03-19 ENCOUNTER — Other Ambulatory Visit (HOSPITAL_COMMUNITY): Payer: Self-pay

## 2023-03-19 ENCOUNTER — Other Ambulatory Visit: Payer: Self-pay

## 2023-03-22 ENCOUNTER — Other Ambulatory Visit: Payer: Self-pay

## 2023-03-28 ENCOUNTER — Ambulatory Visit: Payer: Self-pay | Admitting: Family Medicine

## 2023-03-28 DIAGNOSIS — M5416 Radiculopathy, lumbar region: Secondary | ICD-10-CM

## 2023-03-28 NOTE — Telephone Encounter (Signed)
Chief Complaint: Worsened tremors, new weakness Symptoms: Worsened tremors, new weakness, dehydration Frequency: A week Disposition: [] ED /[] Urgent Care (no appt availability in office) / [x] Appointment(In office/virtual)/ []  [facility]/ [] Home Care/ [] Refused Recommended Disposition /[] [facility]/ []  Follow-up with PCP Additional Notes: Patient called in stating she is having worsened tremors. Patient states they are getting so bad she cannot hold her drinks. Patient also states she has new onset weakness as of about a week, and is feeling dehydrated with  a dry sticky mouth. Patient is requesting refill of hydrocodone and primidone at this time as well. Patient follow up appointment for evaluation created for tomorrow in-office.    Copied from CRM [id number]. Topic: Clinical - Red Word Triage >> Mar 28, 2023  1:04 PM Dondra Prader A wrote: [facility] that prompted transfer to Nurse Triage: Pt states that she was diagnosed with tremors and it has gotten worse. She cannot even carry a cup of coffee. Pt also states that she is extremely exhausted. Reason for Disposition  [1] MODERATE weakness (i.e., interferes with work, school, normal activities) AND [2] persists > 3 days  Answer Assessment - Initial Assessment Questions 1. DESCRIPTION: "Describe how you are feeling."     "I know I'm depressed, hard for me to hold onto anything because the tremors have gotten so bad" 3. ONSET: "When did these symptoms begin?" (e.g., hours, days, weeks, months)     About a week 4. CAUSE: "What do you think is causing the weakness or fatigue?" (e.g., not drinking enough fluids, medical problem, trouble sleeping)     I think it's related to depression related 5. NEW MEDICINES:  "Have you started on any new medicines recently?" (e.g., opioid pain medicines, benzodiazepines, muscle relaxants, antidepressants, antihistamines, neuroleptics, beta blockers)     No 6. OTHER SYMPTOMS: "Do you have any  other symptoms?" (e.g., chest pain, fever, cough, SOB, vomiting, diarrhea, bleeding, other areas of pain)     Exhaustion, tremors, migraine  Protocols used: Weakness (Generalized) and Fatigue-A-AH

## 2023-03-29 ENCOUNTER — Ambulatory Visit: Payer: Medicare PPO

## 2023-04-01 ENCOUNTER — Other Ambulatory Visit: Payer: Self-pay

## 2023-04-01 ENCOUNTER — Other Ambulatory Visit: Payer: Self-pay | Admitting: Family Medicine

## 2023-04-01 ENCOUNTER — Ambulatory Visit (INDEPENDENT_AMBULATORY_CARE_PROVIDER_SITE_OTHER): Payer: Medicare HMO

## 2023-04-01 ENCOUNTER — Other Ambulatory Visit (HOSPITAL_COMMUNITY): Payer: Self-pay

## 2023-04-01 VITALS — BP 100/60 | HR 89 | Temp 97.5°F | Ht 61.25 in | Wt 85.0 lb

## 2023-04-01 DIAGNOSIS — I951 Orthostatic hypotension: Secondary | ICD-10-CM

## 2023-04-01 DIAGNOSIS — F331 Major depressive disorder, recurrent, moderate: Secondary | ICD-10-CM

## 2023-04-01 DIAGNOSIS — M5416 Radiculopathy, lumbar region: Secondary | ICD-10-CM

## 2023-04-01 DIAGNOSIS — Z79899 Other long term (current) drug therapy: Secondary | ICD-10-CM | POA: Diagnosis not present

## 2023-04-01 DIAGNOSIS — G43109 Migraine with aura, not intractable, without status migrainosus: Secondary | ICD-10-CM | POA: Diagnosis not present

## 2023-04-01 DIAGNOSIS — R5383 Other fatigue: Secondary | ICD-10-CM | POA: Diagnosis not present

## 2023-04-01 DIAGNOSIS — R251 Tremor, unspecified: Secondary | ICD-10-CM

## 2023-04-01 DIAGNOSIS — R413 Other amnesia: Secondary | ICD-10-CM | POA: Diagnosis not present

## 2023-04-01 DIAGNOSIS — F332 Major depressive disorder, recurrent severe without psychotic features: Secondary | ICD-10-CM

## 2023-04-01 MED ORDER — MIDODRINE HCL 5 MG PO TABS
5.0000 mg | ORAL_TABLET | Freq: Three times a day (TID) | ORAL | 1 refills | Status: DC
  Filled 2023-04-01 (×2): qty 90, 30d supply, fill #0
  Filled 2023-04-18 – 2023-04-24 (×3): qty 90, 30d supply, fill #1

## 2023-04-01 MED ORDER — MEMANTINE HCL 10 MG PO TABS
10.0000 mg | ORAL_TABLET | Freq: Two times a day (BID) | ORAL | 3 refills | Status: DC
  Filled 2023-04-01 (×2): qty 60, 30d supply, fill #0
  Filled 2023-04-18 – 2023-04-24 (×3): qty 60, 30d supply, fill #1
  Filled 2023-04-29: qty 60, 30d supply, fill #2
  Filled ????-??-??: fill #2

## 2023-04-01 MED ORDER — PRIMIDONE 50 MG PO TABS
50.0000 mg | ORAL_TABLET | Freq: Every day | ORAL | 0 refills | Status: DC
  Filled 2023-04-01 (×2): qty 30, 30d supply, fill #0
  Filled 2023-04-18 – 2023-04-24 (×3): qty 30, 30d supply, fill #1
  Filled 2023-04-29: qty 30, 30d supply, fill #2
  Filled ????-??-??: fill #2

## 2023-04-01 MED ORDER — HYDROCODONE-ACETAMINOPHEN 5-325 MG PO TABS
1.0000 | ORAL_TABLET | Freq: Every day | ORAL | 0 refills | Status: DC | PRN
  Filled 2023-04-01: qty 30, 30d supply, fill #0

## 2023-04-01 MED ORDER — QULIPTA 60 MG PO TABS
60.0000 mg | ORAL_TABLET | Freq: Every day | ORAL | 0 refills | Status: DC
  Filled 2023-04-01: qty 30, 30d supply, fill #0

## 2023-04-01 NOTE — Assessment & Plan Note (Signed)
Worsening tremor affecting daily activities, generalized tremor.. Previously on primidone, increased to a full pill by Dr. [name] but ran out. Discussed risks of side effects and interactions. - Refill primidone at full pill dose- ie 50 mg daily.  - Consider starting at half a pill if preferred

## 2023-04-01 NOTE — Assessment & Plan Note (Signed)
On memantine 10 mg TWICE DAILY.Marland Kitchen Previous MRI showed atrophy, likely age-related or familial. Family history of dementia. Discussed that memantine may slow progression but not reverse it. - Continue memantine

## 2023-04-01 NOTE — Patient Instructions (Signed)
VISIT SUMMARY:  During today's visit, we discussed your worsening tremors, ongoing depression, migraines, and other health concerns. We reviewed your current medications and made adjustments to better manage your symptoms. We also talked about the importance of hydration and smoking cessation.  YOUR PLAN:  -TREMOR: Tremors are involuntary muscle movements that can interfere with daily activities. We will refill your Primidone prescription at the full pill dose, but you can start with half a pill if you prefer.  -MIGRAINE: Migraines are severe headaches that can cause significant pain and other symptoms. We will refill your [name] prescription with a 90-day supply, and you should take it daily to help prevent migraines.  -DEPRESSION: Depression is a mood disorder that causes persistent feelings of sadness and loss of interest. We need to confirm your current dose of Sertraline and increase it to 200 mg if you are not already taking this dose.  -DEMENTIA: Dementia is a decline in cognitive function that affects memory and daily activities. You should continue taking Memantine as it may help slow the progression of symptoms.  -POLYPHARMACY: Polypharmacy refers to the use of multiple medications, which can increase the risk of side effects and interactions. We will refer you to a pharmacist for a comprehensive medication review to streamline your medication list.  -DEHYDRATION: Dehydration occurs when your body does not have enough water, leading to symptoms like dry mouth and sticky lips. It is important to drink 64 ounces of water daily to stay hydrated.  -GENERAL HEALTH MAINTENANCE: Maintaining good health involves regular check-ups and healthy lifestyle choices. We encourage you to quit smoking and will order routine blood tests to check your overall health.  INSTRUCTIONS:

## 2023-04-01 NOTE — Assessment & Plan Note (Signed)
Experiences migraines, takes hydrocodone as needed. Prescribed [name] for prevention but ran out. Discussed daily use for prevention. - Refill [name] with a 90-day supply - Instruct to take [name] daily - advised to take hydrocodone sparingly to prevent dependence, drowsiness and falls. PMP reviewed and 30 tabs of hydrocodone refilled

## 2023-04-01 NOTE — Progress Notes (Signed)
Acute Office Visit  Subjective:    Patient ID: Anne [name], female    DOB: December 31, 1945, 77 y.o.   MRN: [id number]  Chief Complaint  Patient presents with   Tremors    Discussed the use of AI scribe software for clinical note transcription with the patient, who gave verbal consent to proceed.     HPI: Patient is in today for extreme weakness and tremors worseness. Accompanied by her son. Both patient and her son are primary historians. Patient states that her tremors has gotten worse over the past two week, she states when she has a cup of coffee by the time she gets to where she is going her coffee is half gone.  Patient states that the medication (vraylar) she was given last visit was going to be over 500 dollars a month, is there anything else she can take.   The patient, with a history of dementia, depression, and tremors, presents with worsening tremors and ongoing depression. She reports that her tremors are constant and interfere with daily activities, such as carrying a cup of coffee. The patient also mentions fatigue, dehydration, memory issues, and depression. She has been experiencing dry mouth and sticky lips, which she attributes to dehydration.  The patient has been prescribed multiple medications, including Vraylar, Zoloft, and Primidone. However, she was unable to start Vraylar due to its high cost. She has been taking Zoloft, but it is unclear whether she has increased the dosage as previously recommended. She ran out of Primidone, which was prescribed for her tremors, and the tremors have worsened since then.  The patient also reports having migraines and takes hydrocodone as needed for the pain. She has been prescribed [name] for migraine prevention, but she ran out of this medication a while ago.  The patient has a history of falls, with the most recent fall occurring two months ago. She did not sustain any injuries from the fall. She also has a family history of  dementia.  The patient is a smoker and has tried to quit several times without success. She acknowledges the need to drink more water to alleviate her symptoms of dry mouth and sticky lips. Currently smoking half a pack a day.   The patient's medication regimen is complex and includes several medications for similar indications. She has expressed a desire to reduce the number of medications she is taking.  The patient was advised to stop driving a month ago due to her medical conditions. She has completed the necessary paperwork for a driver's test, but she has been advised to hold off on taking the test until her medical issues are better managed.   Past Medical History:  Diagnosis Date   Acquired absence of both breasts and nipples 06/08/2015   Acute infection of nasal sinus 05/04/2021   Aortic atherosclerosis 01/30/2021   Asthma    daily inhalers   Ataxia 10/18/2020   Bilateral carpal tunnel syndrome 01/30/2021   Bipolar disorder, mixed 06/16/2021   Breast cancer of upper-inner quadrant of left female breast 04/15/2015   Bronchopneumonia 07/22/2022   Carotid artery disease 05/04/2016   Mild, 1-39% bilaterally by doppler in March 2018   Chronic pain in right shoulder 12/25/2020   COPD with acute exacerbation 07/03/2016   Dental crowns present    Drug induced constipation 04/10/2021   EIC (epidermal inclusion cyst) 03/21/2021   Family history of breast cancer in female 04/26/2015   Dx. Niece at 3; dx. Maternal aunt in her late  60s  Formatting of this note might be different from the original. Overview:  Dx. Niece at 46; dx. Maternal aunt in her late 25s   Family history of colon cancer 04/26/2015   Dx. In maternal grandfather in his late 20s-70  Formatting of this note might be different from the original. Overview:  Dx. In maternal grandfather in his late 16s-70   Frequent falls 10/18/2020   Genetic testing 06/02/2015   Negative for pathogenic mutations within any of 20 genes on  the Breast/Ovarian Cancer Panel through [employer].  No variants of uncertain significance (VUSes) were found.  The Breast/Ovarian Cancer Panel offered by [facility] Basilio Cairo, MD) includes sequencing and deletion/duplication analysis for the following 19 genes:  ATM, BARD1, BRCA1, BRCA2, BRIP1, CDH1, CHEK2, FANCC, M   History of basal cell carcinoma 04/26/2015   History of concussion 01/01/2014   History of kidney stones    History of TIA (transient ischemic attack) 10/2014   Hyperlipidemia    Insomnia    Lumbar back pain 10/18/2020   Major depressive disorder 10/18/2020   Malrotation of intestine 09/23/2017   Migraine aura without headache 02/08/2014   Migraine headache 01/30/2021   Nasal congestion 05/26/2015   will finish z-pak 05/27/2015   Nonproductive cough 05/26/2015   PONV (postoperative nausea and vomiting)    "long time ago"  none recently   Psychogenic syncope 04/23/2019   Right lumbar radiculopathy 04/23/2019   RLS (restless legs syndrome)    Simple chronic bronchitis 07/04/2019   Tobacco use    Tremor 10/18/2020   Vitamin D deficiency    Wears dentures    Weight loss 08/28/2019    Past Surgical History:  Procedure Laterality Date   ABDOMINAL HYSTERECTOMY     APPENDECTOMY  2000   BREAST BIOPSY Left    BREAST RECONSTRUCTION WITH PLACEMENT OF TISSUE EXPANDER AND FLEX HD (ACELLULAR HYDRATED DERMIS) Bilateral 06/02/2015   Procedure: BILATERAL BREAST RECONSTRUCTION WITH PLACEMENT OF TISSUE EXPANDER AND  ACELLULAR HYDRATED DERMIS;  Surgeon: Glenna Fellows, MD;  Location: [facility];  Service: Plastics;  Laterality: Bilateral;   CARPAL TUNNEL RELEASE Bilateral    CATARACT EXTRACTION Bilateral 09/2013   KNEE ARTHROSCOPY Left    LAPAROSCOPIC ABDOMINAL EXPLORATION N/A 10/2017   LAPAROSCOPIC LYSIS OF CONGENITAL ADHESIVE BAND/LADD'S BANDS WIH INTRAOPERATIVE CHOLANGIOGRAPHY. Perforation of small intestion requiring repair.    LOOP RECORDER  INSERTION N/A 05/11/2016   Procedure: Loop Recorder Insertion;  Surgeon: Will Jorja Loa, MD;  Location: [facility];  Service: Cardiovascular;  Laterality: N/A;   MASTECTOMY W/ SENTINEL NODE BIOPSY Bilateral 06/02/2015   Procedure: BILATERAL MASTECTOMY WITH LEFT SENTINEL LYMPH NODE BIOPSY;  Surgeon: Almond Lint, MD;  Location: [facility];  Service: General;  Laterality: Bilateral;   NASAL SEPTUM SURGERY     x 3   PLANTAR FASCIA SURGERY Left    REMOVAL OF BILATERAL TISSUE EXPANDERS WITH PLACEMENT OF BILATERAL BREAST IMPLANTS Bilateral 06/17/2015   Procedure: DEBRIDEMENT OF BILATERAL MASTECTOMY FLAP WITH BILATERAL TISSUE EXPANDER EXCHANGE ;  Surgeon: Glenna Fellows, MD;  Location: [facility];  Service: Plastics;  Laterality: Bilateral;   REMOVAL OF TISSUE EXPANDER Bilateral 07/12/2015   Procedure: REMOVAL OF BILATERAL TISSUE EXPANDERS;  Surgeon: Glenna Fellows, MD;  Location: [facility];  Service: Plastics;  Laterality: Bilateral;    Family History  Problem Relation Age of Onset   Cervical cancer Mother 53   Colon cancer Maternal Grandfather        mets  to stomach; dx. 67-70   Heart disease Father    Prostate cancer Father 69   Cervical cancer Maternal Grandmother        dx. 87s; treated with radium implant   Lung cancer Sister 35       maternal half-sister dx. lung cancer, stage III   Breast cancer Maternal Aunt    Cervical cancer Sister 59       paternal half-sister; s/p TAH   Spina bifida Grandchild    Brain cancer Grandchild        grandson dx. at 20 mos, treated at [facility]   Breast cancer Other 51       maternal half-sister's daughter   Cancer Maternal Aunt        d. 36; unspecified type - "began at back area and moved to vital organs"   Cancer Maternal Uncle         late 70s; unspecified type; "started at back and moved to vital organs"    Social History   Socioeconomic History   Marital status: Widowed    Spouse name: Not on file    Number of children: 1   Years of education: 42   Highest education level: Not on file  Occupational History    Comment: [employer], retired  Tobacco Use   Smoking status: Every Day    Current packs/day: 0.25    Types: Cigarettes   Smokeless tobacco: Never  Vaping Use   Vaping status: Never Used  Substance and Sexual Activity   Alcohol use: Never   Drug use: No   Sexual activity: Not Currently  Other Topics Concern   Not on file  Social History Narrative   Lives alone, widow   Caffeine use- tea, 1 cup daily   Right Handed    Lives in a one story [place] home    Social Drivers of Health   Financial Resource Strain: Low Risk  (03/13/2023)   Overall Financial Resource Strain (CARDIA)    Difficulty of Paying Living Expenses: Not hard at all  Food Insecurity: No Food Insecurity (03/13/2023)   Hunger Vital Sign    Worried About Running Out of Food in the Last Year: Never true    Ran Out of Food in the Last Year: Never true  Transportation Needs: No Transportation Needs (03/13/2023)   PRAPARE - Administrator, Civil Service (Medical): No    Lack of Transportation (Non-Medical): No  Physical Activity: Inactive (03/13/2023)   Exercise Vital Sign    Days of Exercise per Week: 0 days    Minutes of Exercise per Session: 0 min  Stress: No Stress Concern Present (03/13/2023)   [employer] of Occupational Health - Occupational Stress Questionnaire    Feeling of Stress : Only a little  Social Connections: Socially Isolated (03/13/2023)   Social Connection and Isolation Panel [NHANES]    Frequency of Communication with Friends and Family: Twice a week    Frequency of Social Gatherings with Friends and Family: Twice a week    Attends Religious Services: Never    Database administrator or Organizations: No    Attends Banker Meetings: Never    Marital Status: Divorced  Catering manager Violence: Not At Risk (03/13/2023)   Humiliation, Afraid, Rape, and Kick  questionnaire    Fear of Current or Ex-Partner: No    Emotionally Abused: No    Physically Abused: No    Sexually Abused: No    Outpatient  Medications Prior to Visit  Medication Sig Dispense Refill   albuterol (PROVENTIL) (2.5 MG/3ML) 0.083% nebulizer solution Take 3 mLs (2.5 mg total) by nebulization 4 (four) times daily. 360 mL 1   albuterol (VENTOLIN HFA) 108 (90 Base) MCG/ACT inhaler INHALE 1 TO 2 PUFFS INTO LUNGS EVERY 6 HOURS AS NEEDED FOR  WHEEZING  AND/OR  SHORTNESS  OF  BREATH 18 g 3   amitriptyline (ELAVIL) 25 MG tablet Take 1 tablet (25 mg total) by mouth at bedtime. 30 tablet 3   atorvastatin (LIPITOR) 10 MG tablet TAKE 1 TABLET EVERY DAY 90 tablet 1   Budeson-Glycopyrrol-Formoterol (BREZTRI AEROSPHERE) 160-9-4.8 MCG/ACT AERO Inhale 2 puffs into the lungs 2 (two) times daily. 3 each 3   cariprazine (VRAYLAR) 1.5 MG capsule Take 1 capsule (1.5 mg total) by mouth daily. 30 capsule 0   cyanocobalamin (VITAMIN B12) 1000 MCG/ML injection Inject 1 mL (1,000 mcg total) every 30 (thirty) days as directed. 1 mL 6   donepezil (ARICEPT ODT) 10 MG disintegrating tablet Take 1 tablet (10 mg total) by mouth at bedtime. 90 tablet 1   famotidine (PEPCID) 20 MG tablet Take 1 tablet (20 mg total) by mouth 2 (two) times daily. 180 tablet 1   gabapentin (NEURONTIN) 300 MG capsule Take 1 capsule (300 mg total) by mouth 3 (three) times daily. 90 capsule 5   megestrol (MEGACE) 40 MG tablet Take 1 tablet (40 mg total) by mouth daily. 30 tablet 3   meloxicam (MOBIC) 15 MG tablet TAKE 1 TABLET EVERY DAY 90 tablet 0   methocarbamol (ROBAXIN) 750 MG tablet Take 1 tablet (750 mg total) by mouth in the morning and at bedtime. 60 tablet 3   mirtazapine (REMERON) 45 MG tablet Take 1 tablet (45 mg total) by mouth at bedtime. 30 tablet 3   montelukast (SINGULAIR) 10 MG tablet Take 1 tablet (10 mg total) by mouth daily. 30 tablet 3   omeprazole (PRILOSEC) 40 MG capsule Take 1 capsule (40 mg total) by mouth 2 (two)  times daily. 60 capsule 3   promethazine (PHENERGAN) 25 MG tablet Take 1 tablet (25 mg total) by mouth every 8 (eight) hours as needed for nausea or vomiting. 90 tablet 0   rizatriptan (MAXALT-MLT) 10 MG disintegrating tablet Take 1 tablet at onset of headache. May repeat 1 dose in 2 hours if needed. Max 2 tabs in 24 hours. 10 tablet 2   SYRINGE-NEEDLE, DISP, 3 ML (LUER LOCK SAFETY SYRINGES) 25G X 1" 3 ML MISC Inject 1 each into the muscle every 30 (thirty) days. 50 each 0   traZODone (DESYREL) 50 MG tablet Take 1 tablet (50 mg total) by mouth at bedtime as needed. for sleep 30 tablet 3   Vitamin D, Ergocalciferol, (DRISDOL) 1.25 MG (50000 UNIT) CAPS capsule Take 1 capsule (50,000 Units total) by mouth every 7 (seven) days. 4 capsule 3   Atogepant ([name]) 60 MG TABS Take by mouth.     HYDROcodone-acetaminophen (NORCO/VICODIN) 5-325 MG tablet TAKE ONE TABLET BY MOUTH daily AS NEEDED FOR SEVERE pain (migranes) 30 tablet 0   memantine (NAMENDA) 10 MG tablet Take 1 tablet (10 mg total) by mouth 2 (two) times daily. 60 tablet 3   midodrine (PROAMATINE) 5 MG tablet Take 1 tablet (5 mg total) by mouth 3 (three) times daily with meals. 90 tablet 1   primidone (MYSOLINE) 50 MG tablet Take 0.5 tablets (25 mg total) by mouth at bedtime. 45 tablet 1   sertraline (ZOLOFT) 100 MG  tablet Take 2 tablets (200 mg total) by mouth daily. 60 tablet 3   No facility-administered medications prior to visit.    Allergies  Allergen Reactions   Clindamycin/Lincomycin Nausea And Vomiting   Lyrica [Pregabalin] Other (See Comments)    PERSONALITY CHANGE    Review of Systems  Constitutional:  Positive for fatigue. Negative for chills and fever.  HENT:  Negative for congestion, ear pain and sore throat.   Respiratory:  Negative for cough and shortness of breath.   Cardiovascular:  Negative for chest pain.  Gastrointestinal:  Negative for abdominal pain, constipation, diarrhea, nausea and vomiting.  Genitourinary:   Negative for dysuria and frequency.  Musculoskeletal:  Negative for arthralgias and myalgias.  Neurological:  Positive for tremors and headaches. Negative for dizziness.  Psychiatric/Behavioral:  Positive for sleep disturbance. Negative for dysphoric mood. The patient is not nervous/anxious.        Objective:        04/01/2023    8:43 AM 03/13/2023    1:54 PM 01/30/2023    9:57 AM  Vitals with BMI  Height 5' 1.25" 5' 1.25" 5' 1.5"  Weight 85 lbs 89 lbs 10 oz 91 lbs  BMI 15.92 16.79 16.92  Systolic 100 122 409  Diastolic 60 68 62  Pulse 89 88 98    No data found.   Physical Exam Vitals and nursing note reviewed.  Constitutional:      Comments: Thin built  HENT:     Head: Normocephalic and atraumatic.     Mouth/Throat:     Comments: Dry tongue Cardiovascular:     Rate and Rhythm: Normal rate and regular rhythm.  Pulmonary:     Effort: Pulmonary effort is normal.     Breath sounds: Normal breath sounds.  Musculoskeletal:        General: Normal range of motion.     Cervical back: Normal range of motion.  Neurological:     General: No focal deficit present.     Mental Status: She is alert.     Comments: TREMOR NOTED- generalized  Psychiatric:        Mood and Affect: Mood normal.     Health Maintenance Due  Topic Date Due   Hepatitis C Screening  Never done   Zoster Vaccines- Shingrix (2 of 2) 04/02/2022   Lung Cancer Screening  05/30/2022    There are no preventive care reminders to display for this patient.   Lab Results  Component Value Date   TSH 2.560 10/23/2022   Lab Results  Component Value Date   WBC 8.2 10/23/2022   HGB 10.7 (L) 10/23/2022   HCT 34.8 10/23/2022   MCV 92 10/23/2022   PLT 272 10/23/2022   Lab Results  Component Value Date   NA 145 (H) 10/23/2022   K 4.5 10/23/2022   CO2 19 (L) 10/23/2022   GLUCOSE 91 10/23/2022   BUN 20 10/23/2022   CREATININE 0.84 10/23/2022   BILITOT <0.2 10/23/2022   ALKPHOS 82 10/23/2022   AST 16  10/23/2022   ALT 10 10/23/2022   PROT 5.9 (L) 10/23/2022   ALBUMIN 4.1 10/23/2022   CALCIUM 9.3 10/23/2022   ANIONGAP 3 (L) 07/03/2016   EGFR 72 10/23/2022   Lab Results  Component Value Date   CHOL 171 09/19/2022   Lab Results  Component Value Date   HDL 49 09/19/2022   Lab Results  Component Value Date   LDLCALC 104 (H) 09/19/2022   Lab Results  Component  Value Date   TRIG 100 09/19/2022   Lab Results  Component Value Date   CHOLHDL 3.5 09/19/2022   No results found for: "HGBA1C"     Assessment & Plan:  Other fatigue -     B12 and Folate Panel -     CBC with Differential/Platelet -     Comprehensive metabolic panel -     Hemoglobin A1c -     T4, free -     TSH  Right lumbar radiculopathy -     HYDROcodone-Acetaminophen; Take 1 tablet by mouth daily as needed for severe pain (migraines).  Dispense: 30 tablet; Refill: 0 -     B12 and Folate Panel -     CBC with Differential/Platelet -     Comprehensive metabolic panel -     Hemoglobin A1c -     T4, free -     TSH  Orthostatic hypotension -     Midodrine HCl; Take 1 tablet (5 mg total) by mouth 3 (three) times daily with meals.  Dispense: 90 tablet; Refill: 1 -     B12 and Folate Panel -     CBC with Differential/Platelet -     Comprehensive metabolic panel -     Hemoglobin A1c -     T4, free -     TSH  Tremor Assessment & Plan: Worsening tremor affecting daily activities, generalized tremor.. Previously on primidone, increased to a full pill by Dr. [name] but ran out. Discussed risks of side effects and interactions. - Refill primidone at full pill dose- ie 50 mg daily.  - Consider starting at half a pill if preferred  Orders: -     B12 and Folate Panel -     CBC with Differential/Platelet -     Comprehensive metabolic panel -     Hemoglobin A1c -     T4, free -     TSH  Migraine aura without headache Assessment & Plan: Experiences migraines, takes hydrocodone as needed. Prescribed [name] for  prevention but ran out. Discussed daily use for prevention. - Refill [name] with a 90-day supply - Instruct to take [name] daily - advised to take hydrocodone sparingly to prevent dependence, drowsiness and falls. PMP reviewed and 30 tabs of hydrocodone refilled   Moderate episode of recurrent major depressive disorder [facility]) Assessment & Plan: On sertraline, dose increased to 200 mg by Dr. [name], in November 2024, unclear if taking increased dose. Also on amitriptyline and trazodone. Discussed potential benefits of increasing sertraline to 200 mg, noting it is the maximum dose. - Confirm current sertraline dose - Increase sertraline to 200 mg if not already done - Hold off on adding new medications until current regimen is confirmed  THOUGHT FOR FUTURE-IF patient continues to have depression, insomnia and decreased appetite, instead of taking Zoloft, trazodone, amitriptyline and megestrol, we could have her on mirtazapine/Remeron and titrate the dose up to see if it helps with all her symptoms   Memory loss Assessment & Plan: On memantine 10 mg TWICE DAILY.Marland Kitchen Previous MRI showed atrophy, likely age-related or familial. Family history of dementia. Discussed that memantine may slow progression but not reverse it. - Continue memantine   Polypharmacy Assessment & Plan: On multiple medications with overlapping purposes and side effects. Need to review and streamline medication list. Discussed risks of polypharmacy and importance of medication review. - Refer to a pharmacist for a comprehensive medication review - Print current medication list with discharge papers  Dehydration Reports dehydration, symptoms include sticky saliva and lips. Acknowledges not drinking enough water. Discussed importance of hydration and benefits of drinking 64 ounces daily. - Encourage drinking 64 ounces of water daily  General Health Maintenance Smokes, has tried to quit multiple times. Had a fall in the  last three months without injuries. Discussed risks of smoking and benefits of cessation. - Encourage smoking cessation or even cutting back on the number of cigarettes, - Order CBC, kidney function, liver function, and thyroid panel  Follow-up - Schedule follow-up appointment in two months - Instruct to reach out if there are any issues with medications or confusion arises.   Other orders -     Memantine HCl; Take 1 tablet (10 mg total) by mouth 2 (two) times daily.  Dispense: 60 tablet; Refill: 3 -     Primidone; Take 1 tablet (50 mg total) by mouth at bedtime.  Dispense: 90 tablet; Refill: 0 -     [name]; Take 1 tablet (60 mg total) by mouth daily.  Dispense: 90 tablet; Refill: 0     Meds ordered this encounter  Medications   HYDROcodone-acetaminophen (NORCO/VICODIN) 5-325 MG tablet    Sig: Take 1 tablet by mouth daily as needed for severe pain (migraines).    Dispense:  30 tablet    Refill:  0   memantine (NAMENDA) 10 MG tablet    Sig: Take 1 tablet (10 mg total) by mouth 2 (two) times daily.    Dispense:  60 tablet    Refill:  3   midodrine (PROAMATINE) 5 MG tablet    Sig: Take 1 tablet (5 mg total) by mouth 3 (three) times daily with meals.    Dispense:  90 tablet    Refill:  1   primidone (MYSOLINE) 50 MG tablet    Sig: Take 1 tablet (50 mg total) by mouth at bedtime.    Dispense:  90 tablet    Refill:  0   Atogepant ([name]) 60 MG TABS    Sig: Take 1 tablet (60 mg total) by mouth daily.    Dispense:  90 tablet    Refill:  0    Orders Placed This Encounter  Procedures   B12 and Folate Panel   CBC with Differential/Platelet   Comprehensive metabolic panel   Hemoglobin A1c   T4, free   TSH     Follow-up: Return in about 8 weeks (around 05/27/2023).  An After Visit Summary was printed and given to the patient.  Total time spent on today's visit was 45 minutes, including both face-to-face time and nonface-to-face time personally spent on review of chart (labs  and imaging), discussing labs and goals, discussing further work-up, treatment options, referrals to specialist if needed, reviewing outside records of pertinent, answering patient's questions, and coordinating care.   Windell Moment, MD [facility] [phone]

## 2023-04-01 NOTE — Assessment & Plan Note (Addendum)
On multiple medications with overlapping purposes and side effects. Need to review and streamline medication list. Discussed risks of polypharmacy and importance of medication review. - Refer to a pharmacist for a comprehensive medication review - Print current medication list with discharge papers    Dehydration Reports dehydration, symptoms include sticky saliva and lips. Acknowledges not drinking enough water. Discussed importance of hydration and benefits of drinking 64 ounces daily. - Encourage drinking 64 ounces of water daily  General Health Maintenance Smokes, has tried to quit multiple times. Had a fall in the last three months without injuries. Discussed risks of smoking and benefits of cessation. - Encourage smoking cessation or even cutting back on the number of cigarettes, - Order CBC, kidney function, liver function, and thyroid panel  Follow-up - Schedule follow-up appointment in two months - Instruct to reach out if there are any issues with medications or confusion arises.

## 2023-04-01 NOTE — Assessment & Plan Note (Signed)
On sertraline, dose increased to 200 mg by Dr. [name], in November 2024, unclear if taking increased dose. Also on amitriptyline and trazodone. Discussed potential benefits of increasing sertraline to 200 mg, noting it is the maximum dose. - Confirm current sertraline dose - Increase sertraline to 200 mg if not already done - Hold off on adding new medications until current regimen is confirmed  THOUGHT FOR FUTURE-IF patient continues to have depression, insomnia and decreased appetite, instead of taking Zoloft, trazodone, amitriptyline and megestrol, we could have her on mirtazapine/Remeron and titrate the dose up to see if it helps with all her symptoms

## 2023-04-02 ENCOUNTER — Other Ambulatory Visit (HOSPITAL_COMMUNITY): Payer: Self-pay

## 2023-04-02 ENCOUNTER — Telehealth: Payer: Self-pay

## 2023-04-02 ENCOUNTER — Other Ambulatory Visit: Payer: Self-pay

## 2023-04-02 LAB — B12 AND FOLATE PANEL
Folate: 4.6 ng/mL (ref >3.0)
Vitamin B-12: 329 pg/mL (ref 232–1245)

## 2023-04-02 LAB — COMPREHENSIVE METABOLIC PANEL
ALT: 13 [IU]/L (ref 0–32)
AST: 19 [IU]/L (ref 0–40)
Albumin: 4 g/dL (ref 3.8–4.8)
Alkaline Phosphatase: 78 [IU]/L (ref 44–121)
BUN/Creatinine Ratio: 24 (ref 12–28)
BUN: 25 mg/dL (ref 8–27)
Bilirubin Total: <0.2 mg/dL (ref 0.0–1.2)
CO2: 21 mmol/L (ref 20–29)
Calcium: 8.6 mg/dL — ABNORMAL LOW (ref 8.7–10.3)
Chloride: 106 mmol/L (ref 96–106)
Creatinine, Ser: 1.04 mg/dL — ABNORMAL HIGH (ref 0.57–1.00)
Globulin, Total: 1.9 g/dL (ref 1.5–4.5)
Glucose: 84 mg/dL (ref 70–99)
Potassium: 3.3 mmol/L — ABNORMAL LOW (ref 3.5–5.2)
Sodium: 141 mmol/L (ref 134–144)
Total Protein: 5.9 g/dL — ABNORMAL LOW (ref 6.0–8.5)
eGFR: 56 mL/min/{1.73_m2} — ABNORMAL LOW (ref >59)

## 2023-04-02 LAB — CBC WITH DIFFERENTIAL/PLATELET
Basophils Absolute: 0.1 10*3/uL (ref 0.0–0.2)
Basos: 0 %
EOS (ABSOLUTE): 0.1 10*3/uL (ref 0.0–0.4)
Eos: 1 %
Hematocrit: 38.1 % (ref 34.0–46.6)
Hemoglobin: 12 g/dL (ref 11.1–15.9)
Immature Grans (Abs): 0.1 10*3/uL (ref 0.0–0.1)
Immature Granulocytes: 1 %
Lymphocytes Absolute: 2.1 10*3/uL (ref 0.7–3.1)
Lymphs: 17 %
MCH: 27.8 pg (ref 26.6–33.0)
MCHC: 31.5 g/dL (ref 31.5–35.7)
MCV: 88 fL (ref 79–97)
Monocytes Absolute: 1.1 10*3/uL — ABNORMAL HIGH (ref 0.1–0.9)
Monocytes: 9 %
Neutrophils Absolute: 8.5 10*3/uL — ABNORMAL HIGH (ref 1.4–7.0)
Neutrophils: 72 %
Platelets: 229 10*3/uL (ref 150–450)
RBC: 4.32 x10E6/uL (ref 3.77–5.28)
RDW: 13.7 % (ref 11.7–15.4)
WBC: 11.9 10*3/uL — ABNORMAL HIGH (ref 3.4–10.8)

## 2023-04-02 LAB — HEMOGLOBIN A1C
Est. average glucose Bld gHb Est-mCnc: 123 mg/dL
Hgb A1c MFr Bld: 5.9 % — ABNORMAL HIGH (ref 4.8–5.6)

## 2023-04-02 LAB — TSH: TSH: 3.77 u[IU]/mL (ref 0.450–4.500)

## 2023-04-02 LAB — T4, FREE: Free T4: 1.21 ng/dL (ref 0.82–1.77)

## 2023-04-02 NOTE — Progress Notes (Signed)
   04/02/2023  Patient ID: Anne [name], female   DOB: [birth year], 78 y.o.   MRN: [id number]  Returning patient call/voicemail stating she had not received her medications from [facility] yet.  Upon reviewing chart notes the pharmacy has attempted to contact the patient twice to get card information to bill co-pay/coinsurance to.  Contacted Ms. Kain to make her aware of this, and she states she spoke with the pharmacy late this afternoon to provide updated billing information.  She states that the pharmacy is supposed to deliver her medications to her home tomorrow.  I informed the patient that I am currently working with Dr. [name] regarding modifying her medication regimen to hopefully make this more beneficial with less medications.  I told the patient to expect a call from me later this week to schedule an in person visit with her and her son to review her medications and recommended changes.  [name], PharmD, DPLA

## 2023-04-03 ENCOUNTER — Other Ambulatory Visit: Payer: Self-pay

## 2023-04-03 DIAGNOSIS — W19XXXA Unspecified fall, initial encounter: Secondary | ICD-10-CM | POA: Diagnosis not present

## 2023-04-03 DIAGNOSIS — R109 Unspecified abdominal pain: Secondary | ICD-10-CM | POA: Diagnosis not present

## 2023-04-03 DIAGNOSIS — R5383 Other fatigue: Secondary | ICD-10-CM | POA: Diagnosis not present

## 2023-04-03 DIAGNOSIS — K5900 Constipation, unspecified: Secondary | ICD-10-CM | POA: Diagnosis not present

## 2023-04-03 DIAGNOSIS — R1084 Generalized abdominal pain: Secondary | ICD-10-CM | POA: Diagnosis not present

## 2023-04-03 DIAGNOSIS — R609 Edema, unspecified: Secondary | ICD-10-CM | POA: Diagnosis not present

## 2023-04-04 ENCOUNTER — Other Ambulatory Visit: Payer: Self-pay

## 2023-04-04 ENCOUNTER — Telehealth: Payer: Self-pay

## 2023-04-04 DIAGNOSIS — D72829 Elevated white blood cell count, unspecified: Secondary | ICD-10-CM | POA: Diagnosis not present

## 2023-04-04 DIAGNOSIS — R531 Weakness: Secondary | ICD-10-CM | POA: Diagnosis not present

## 2023-04-04 DIAGNOSIS — E43 Unspecified severe protein-calorie malnutrition: Secondary | ICD-10-CM | POA: Diagnosis not present

## 2023-04-04 DIAGNOSIS — R5383 Other fatigue: Secondary | ICD-10-CM | POA: Diagnosis not present

## 2023-04-04 DIAGNOSIS — E8729 Other acidosis: Secondary | ICD-10-CM | POA: Diagnosis not present

## 2023-04-04 DIAGNOSIS — R64 Cachexia: Secondary | ICD-10-CM | POA: Diagnosis not present

## 2023-04-04 DIAGNOSIS — R109 Unspecified abdominal pain: Secondary | ICD-10-CM | POA: Diagnosis not present

## 2023-04-04 DIAGNOSIS — E860 Dehydration: Secondary | ICD-10-CM | POA: Diagnosis not present

## 2023-04-04 DIAGNOSIS — Z23 Encounter for immunization: Secondary | ICD-10-CM | POA: Diagnosis not present

## 2023-04-04 DIAGNOSIS — W19XXXA Unspecified fall, initial encounter: Secondary | ICD-10-CM | POA: Diagnosis not present

## 2023-04-04 DIAGNOSIS — J9611 Chronic respiratory failure with hypoxia: Secondary | ICD-10-CM | POA: Diagnosis not present

## 2023-04-04 DIAGNOSIS — K5900 Constipation, unspecified: Secondary | ICD-10-CM | POA: Diagnosis not present

## 2023-04-04 DIAGNOSIS — Z681 Body mass index (BMI) 19 or less, adult: Secondary | ICD-10-CM | POA: Diagnosis not present

## 2023-04-04 DIAGNOSIS — G9341 Metabolic encephalopathy: Secondary | ICD-10-CM | POA: Diagnosis not present

## 2023-04-04 DIAGNOSIS — J441 Chronic obstructive pulmonary disease with (acute) exacerbation: Secondary | ICD-10-CM | POA: Diagnosis not present

## 2023-04-04 NOTE — Telephone Encounter (Signed)
Copied from CRM [id number]. Topic: General - Other >> Apr 04, 2023 11:58 AM Fuller Mandril wrote: Reason for CRM: Patient son called wanted to let provider know that patient fell last night and has been admitted to [facility]. Currently in ED #11 but may be moved when they have availability.

## 2023-04-04 NOTE — Telephone Encounter (Signed)
Thank you. Dr. [name]

## 2023-04-05 ENCOUNTER — Other Ambulatory Visit: Payer: Self-pay

## 2023-04-08 ENCOUNTER — Telehealth: Payer: Self-pay

## 2023-04-08 NOTE — Telephone Encounter (Signed)
[facility] called and stated got Patient got [facility] from [facility]   Made nurse aware patient is a current patient here and send last chart note.

## 2023-04-09 ENCOUNTER — Other Ambulatory Visit: Payer: Self-pay

## 2023-04-09 DIAGNOSIS — Z9289 Personal history of other medical treatment: Secondary | ICD-10-CM

## 2023-04-10 DIAGNOSIS — I700 Atherosclerosis of aorta: Secondary | ICD-10-CM | POA: Diagnosis not present

## 2023-04-10 DIAGNOSIS — F0393 Unspecified dementia, unspecified severity, with mood disturbance: Secondary | ICD-10-CM | POA: Diagnosis not present

## 2023-04-10 DIAGNOSIS — F3160 Bipolar disorder, current episode mixed, unspecified: Secondary | ICD-10-CM | POA: Diagnosis not present

## 2023-04-10 DIAGNOSIS — F03918 Unspecified dementia, unspecified severity, with other behavioral disturbance: Secondary | ICD-10-CM | POA: Diagnosis not present

## 2023-04-10 DIAGNOSIS — J9611 Chronic respiratory failure with hypoxia: Secondary | ICD-10-CM | POA: Diagnosis not present

## 2023-04-10 DIAGNOSIS — E43 Unspecified severe protein-calorie malnutrition: Secondary | ICD-10-CM | POA: Diagnosis not present

## 2023-04-10 DIAGNOSIS — G4700 Insomnia, unspecified: Secondary | ICD-10-CM | POA: Diagnosis not present

## 2023-04-10 DIAGNOSIS — J4489 Other specified chronic obstructive pulmonary disease: Secondary | ICD-10-CM | POA: Diagnosis not present

## 2023-04-10 DIAGNOSIS — F0394 Unspecified dementia, unspecified severity, with anxiety: Secondary | ICD-10-CM | POA: Diagnosis not present

## 2023-04-10 DIAGNOSIS — J441 Chronic obstructive pulmonary disease with (acute) exacerbation: Secondary | ICD-10-CM | POA: Diagnosis not present

## 2023-04-11 ENCOUNTER — Telehealth: Payer: Self-pay | Admitting: *Deleted

## 2023-04-11 NOTE — Progress Notes (Signed)
 Complex Care Management Note  Care Guide Note 04/11/2023 Name: Anne [name] MRN: [id number] DOB: 1945/05/25  Anne [name] is a 78 y.o. year old female who sees Cox, Kirsten, MD for primary care. I reached out to Anne [name] by phone today to offer complex care management services.  [name] was given information about Complex Care Management services today including:   The Complex Care Management services include support from the care team which includes your Nurse Care Manager, Clinical Social Worker, or Pharmacist.  The Complex Care Management team is here to help remove barriers to the health concerns and goals most important to you. Complex Care Management services are voluntary, and the patient may decline or stop services at any time by request to their care team member.   Complex Care Management Consent Status: Patient agreed to services and verbal consent obtained.   Follow up plan:  Telephone appointment with complex care management team member scheduled for:  04/17/2023  Encounter Outcome:  Patient Scheduled  Thedford Franks, CMA, Care Guide [facility]  [facility], [facility] Guide Direct Dial: [phone]  Fax: [phone] Website: [email or web address]

## 2023-04-11 NOTE — Progress Notes (Signed)
 Complex Care Management Note Care Guide Note  04/11/2023 Name: Anne [name] MRN: [id number] DOB: 1945/09/11   Complex Care Management Outreach Attempts: An unsuccessful telephone outreach was attempted today to offer the patient information about available complex care management services.  Follow Up Plan:  Additional outreach attempts will be made to offer the patient complex care management information and services.   Encounter Outcome:  No Answer  Thedford Franks, CMA, Care Guide [facility]  [facility], [facility] Guide Direct Dial: [phone]  Fax: [phone] Website: [email or web address]

## 2023-04-12 ENCOUNTER — Telehealth: Payer: Self-pay

## 2023-04-12 DIAGNOSIS — J4489 Other specified chronic obstructive pulmonary disease: Secondary | ICD-10-CM | POA: Diagnosis not present

## 2023-04-12 DIAGNOSIS — F03918 Unspecified dementia, unspecified severity, with other behavioral disturbance: Secondary | ICD-10-CM | POA: Diagnosis not present

## 2023-04-12 DIAGNOSIS — E43 Unspecified severe protein-calorie malnutrition: Secondary | ICD-10-CM | POA: Diagnosis not present

## 2023-04-12 DIAGNOSIS — F0394 Unspecified dementia, unspecified severity, with anxiety: Secondary | ICD-10-CM | POA: Diagnosis not present

## 2023-04-12 DIAGNOSIS — F0393 Unspecified dementia, unspecified severity, with mood disturbance: Secondary | ICD-10-CM | POA: Diagnosis not present

## 2023-04-12 DIAGNOSIS — J9611 Chronic respiratory failure with hypoxia: Secondary | ICD-10-CM | POA: Diagnosis not present

## 2023-04-12 DIAGNOSIS — I700 Atherosclerosis of aorta: Secondary | ICD-10-CM | POA: Diagnosis not present

## 2023-04-12 DIAGNOSIS — F3160 Bipolar disorder, current episode mixed, unspecified: Secondary | ICD-10-CM | POA: Diagnosis not present

## 2023-04-12 DIAGNOSIS — G4700 Insomnia, unspecified: Secondary | ICD-10-CM | POA: Diagnosis not present

## 2023-04-12 NOTE — Telephone Encounter (Signed)
 VO gien to [place] for orders below. She states she has faxed over orders to be signed.

## 2023-04-12 NOTE — Telephone Encounter (Signed)
 Copied from CRM [id number]. Topic: Clinical - Home Health Verbal Orders >> Apr 10, 2023  3:35 PM Delon DASEN wrote: Caller/Agency: Isaiah with [facility] Callback Number: [phone] Service Requested: Skilled Nursing Frequency: 1x week 1 2x week 2  1x wk 3 2 PRN  Any new concerns about the patient? Yes  was in hospital with COPD exacerbation, home health for monitoring.   Also needs child psychotherapist referral.

## 2023-04-15 ENCOUNTER — Telehealth: Payer: Self-pay

## 2023-04-15 NOTE — Telephone Encounter (Signed)
 Copied from CRM [id number]. Topic: Clinical - Home Health Verbal Orders >> Apr 15, 2023 10:00 AM Tisa Forester wrote: [facility]Agency[facility]  Callback Number: [phone] Service Requested: Physical Therapy Frequency: 1 week 4 2 week 4 Any new concerns about the patient? Yes , weigh ,nutrient,and dehydration

## 2023-04-15 NOTE — Telephone Encounter (Signed)
 VO given for orders requested below.

## 2023-04-16 ENCOUNTER — Telehealth: Payer: Self-pay

## 2023-04-16 NOTE — Telephone Encounter (Signed)
VO given for request below at number provided.  Copied from CRM [id number]. Topic: Clinical - Home Health Verbal Orders >> Apr 16, 2023  9:38 AM Antwanette L wrote: Caller/Agency: [facility]) Callback Number: [phone] Service Requested:  Meal Planning Physical Therapy and meal planning Frequency: 1 visit right now so an social worker can do an evaluation  Any new concerns about the patient? No

## 2023-04-16 NOTE — Telephone Encounter (Signed)
Copied from CRM [id number]. Topic: Clinical - Home Health Verbal Orders >> Apr 16, 2023 10:29 AM Fuller Mandril wrote: Caller/Agency: [facility]  Callback Number: [phone] Service Requested: Skilled Nursing - N/A - Caller making provider aware she spokse with patient last night ans schedule time to meet for today. Arrived today and called, knocked on both doors and no response. Not sure that is going on but will have to mark today as attempt. Next visit scheduled for Friday.  Frequency: N/A Any new concerns about the patient? N/A

## 2023-04-17 ENCOUNTER — Other Ambulatory Visit: Payer: Self-pay

## 2023-04-17 ENCOUNTER — Telehealth: Payer: Self-pay | Admitting: *Deleted

## 2023-04-17 ENCOUNTER — Other Ambulatory Visit: Payer: Self-pay | Admitting: *Deleted

## 2023-04-17 DIAGNOSIS — I700 Atherosclerosis of aorta: Secondary | ICD-10-CM | POA: Diagnosis not present

## 2023-04-17 DIAGNOSIS — F3160 Bipolar disorder, current episode mixed, unspecified: Secondary | ICD-10-CM | POA: Diagnosis not present

## 2023-04-17 DIAGNOSIS — F0394 Unspecified dementia, unspecified severity, with anxiety: Secondary | ICD-10-CM | POA: Diagnosis not present

## 2023-04-17 DIAGNOSIS — G4700 Insomnia, unspecified: Secondary | ICD-10-CM | POA: Diagnosis not present

## 2023-04-17 DIAGNOSIS — F0393 Unspecified dementia, unspecified severity, with mood disturbance: Secondary | ICD-10-CM | POA: Diagnosis not present

## 2023-04-17 DIAGNOSIS — J9611 Chronic respiratory failure with hypoxia: Secondary | ICD-10-CM | POA: Diagnosis not present

## 2023-04-17 DIAGNOSIS — F03918 Unspecified dementia, unspecified severity, with other behavioral disturbance: Secondary | ICD-10-CM | POA: Diagnosis not present

## 2023-04-17 DIAGNOSIS — J4489 Other specified chronic obstructive pulmonary disease: Secondary | ICD-10-CM | POA: Diagnosis not present

## 2023-04-17 DIAGNOSIS — E43 Unspecified severe protein-calorie malnutrition: Secondary | ICD-10-CM | POA: Diagnosis not present

## 2023-04-17 NOTE — Patient Outreach (Signed)
  Care Management   Follow Up Note   04/17/2023 Name: Anne [name] MRN: [id number] DOB: 1945/03/31   Referred by: Blane Ohara, MD Reason for referral : Care Management (RNCM: ATTEMPT Initial Outreach For Chronic Disease Disease Management & Care Coordination Needs)   An unsuccessful telephone outreach was attempted today. The patient was referred to the case management team for assistance with care management and care coordination.   Follow Up Plan: The care management team will reach out to the patient again over the next 5-7 days.   Larey Brick, BSN RN [facility], [facility] RN Care Manager Direct Dial: [phone]  Fax: [phone]

## 2023-04-18 ENCOUNTER — Other Ambulatory Visit: Payer: Self-pay

## 2023-04-18 ENCOUNTER — Other Ambulatory Visit (HOSPITAL_COMMUNITY): Payer: Self-pay

## 2023-04-18 DIAGNOSIS — J4489 Other specified chronic obstructive pulmonary disease: Secondary | ICD-10-CM | POA: Diagnosis not present

## 2023-04-18 DIAGNOSIS — F3160 Bipolar disorder, current episode mixed, unspecified: Secondary | ICD-10-CM | POA: Diagnosis not present

## 2023-04-18 DIAGNOSIS — J9611 Chronic respiratory failure with hypoxia: Secondary | ICD-10-CM | POA: Diagnosis not present

## 2023-04-18 DIAGNOSIS — G4700 Insomnia, unspecified: Secondary | ICD-10-CM | POA: Diagnosis not present

## 2023-04-18 DIAGNOSIS — E43 Unspecified severe protein-calorie malnutrition: Secondary | ICD-10-CM | POA: Diagnosis not present

## 2023-04-18 DIAGNOSIS — F0393 Unspecified dementia, unspecified severity, with mood disturbance: Secondary | ICD-10-CM | POA: Diagnosis not present

## 2023-04-18 DIAGNOSIS — I700 Atherosclerosis of aorta: Secondary | ICD-10-CM | POA: Diagnosis not present

## 2023-04-18 DIAGNOSIS — F0394 Unspecified dementia, unspecified severity, with anxiety: Secondary | ICD-10-CM | POA: Diagnosis not present

## 2023-04-18 DIAGNOSIS — F03918 Unspecified dementia, unspecified severity, with other behavioral disturbance: Secondary | ICD-10-CM | POA: Diagnosis not present

## 2023-04-22 DIAGNOSIS — F03918 Unspecified dementia, unspecified severity, with other behavioral disturbance: Secondary | ICD-10-CM | POA: Diagnosis not present

## 2023-04-22 DIAGNOSIS — F0394 Unspecified dementia, unspecified severity, with anxiety: Secondary | ICD-10-CM | POA: Diagnosis not present

## 2023-04-22 DIAGNOSIS — J4489 Other specified chronic obstructive pulmonary disease: Secondary | ICD-10-CM | POA: Diagnosis not present

## 2023-04-22 DIAGNOSIS — J9611 Chronic respiratory failure with hypoxia: Secondary | ICD-10-CM | POA: Diagnosis not present

## 2023-04-22 DIAGNOSIS — G4700 Insomnia, unspecified: Secondary | ICD-10-CM | POA: Diagnosis not present

## 2023-04-22 DIAGNOSIS — E43 Unspecified severe protein-calorie malnutrition: Secondary | ICD-10-CM | POA: Diagnosis not present

## 2023-04-22 DIAGNOSIS — F3160 Bipolar disorder, current episode mixed, unspecified: Secondary | ICD-10-CM | POA: Diagnosis not present

## 2023-04-22 DIAGNOSIS — I700 Atherosclerosis of aorta: Secondary | ICD-10-CM | POA: Diagnosis not present

## 2023-04-22 DIAGNOSIS — F0393 Unspecified dementia, unspecified severity, with mood disturbance: Secondary | ICD-10-CM | POA: Diagnosis not present

## 2023-04-23 ENCOUNTER — Telehealth: Payer: Self-pay | Admitting: *Deleted

## 2023-04-23 DIAGNOSIS — F3160 Bipolar disorder, current episode mixed, unspecified: Secondary | ICD-10-CM | POA: Diagnosis not present

## 2023-04-23 DIAGNOSIS — F0394 Unspecified dementia, unspecified severity, with anxiety: Secondary | ICD-10-CM | POA: Diagnosis not present

## 2023-04-23 DIAGNOSIS — J4489 Other specified chronic obstructive pulmonary disease: Secondary | ICD-10-CM | POA: Diagnosis not present

## 2023-04-23 DIAGNOSIS — F0393 Unspecified dementia, unspecified severity, with mood disturbance: Secondary | ICD-10-CM | POA: Diagnosis not present

## 2023-04-23 DIAGNOSIS — G4700 Insomnia, unspecified: Secondary | ICD-10-CM | POA: Diagnosis not present

## 2023-04-23 DIAGNOSIS — E43 Unspecified severe protein-calorie malnutrition: Secondary | ICD-10-CM | POA: Diagnosis not present

## 2023-04-23 DIAGNOSIS — J9611 Chronic respiratory failure with hypoxia: Secondary | ICD-10-CM | POA: Diagnosis not present

## 2023-04-23 DIAGNOSIS — I700 Atherosclerosis of aorta: Secondary | ICD-10-CM | POA: Diagnosis not present

## 2023-04-23 DIAGNOSIS — F03918 Unspecified dementia, unspecified severity, with other behavioral disturbance: Secondary | ICD-10-CM | POA: Diagnosis not present

## 2023-04-23 NOTE — Progress Notes (Signed)
 Complex Care Management Care Guide Note  04/23/2023 Name: Anne [name] MRN: [id number] DOB: 07/28/1945  Anne [name] is a 78 y.o. year old female who is a primary care patient of Cox, Kirsten, MD and is actively engaged with the care management team. I reached out to Anne [name] by phone today to assist with re-scheduling  with the RN Case Manager.  Follow up plan: Unsuccessful telephone outreach attempt made. A HIPAA compliant phone message was left for the patient providing contact information and requesting a return call.  Burman Nieves, CMA, Care Guide [facility]  [facility] Guide Direct Dial: [phone]  Fax: [phone] Website: [email or web address]

## 2023-04-23 NOTE — Progress Notes (Signed)
 Complex Care Management Care Guide Note  04/23/2023 Name: NEGAR SIELER MRN: [id number] DOB: Nov 25, 1945  Lonia Skinner is a 78 y.o. year old female who is a primary care patient of Cox, Kirsten, MD and is actively engaged with the care management team. I reached out to Lonia Skinner by phone today to assist with re-scheduling  with the RN Case Manager.  Follow up plan: Telephone appointment with complex care management team member scheduled for:  04/25/2023  Burman Nieves, CMA, Care Guide [facility], [facility] Guide Direct Dial: [phone]  Fax: [phone] Website: [email or web address]

## 2023-04-24 ENCOUNTER — Other Ambulatory Visit (HOSPITAL_COMMUNITY): Payer: Self-pay

## 2023-04-24 ENCOUNTER — Other Ambulatory Visit: Payer: Self-pay

## 2023-04-24 NOTE — Telephone Encounter (Unsigned)
 Copied from CRM [id number]. Topic: Clinical - Home Health Verbal Orders >> Apr 23, 2023  5:04 PM Higinio Roger wrote: Caller/Agency: [facility] Callback Number: [phone] Service Requested: Physical Therapy Frequency: n/a Any new concerns about the patient? No Joni Reining just wanted to let the clinic know she was not able to make contact with the patient

## 2023-04-24 NOTE — Telephone Encounter (Signed)
 This is not our patient.

## 2023-04-25 ENCOUNTER — Other Ambulatory Visit: Payer: Self-pay

## 2023-04-25 ENCOUNTER — Telehealth: Payer: Self-pay | Admitting: *Deleted

## 2023-04-25 NOTE — Progress Notes (Signed)
   04/25/2023  Patient ID: Anne [name], female   DOB: 12-Mar-1945, 78 y.o.   MRN: [id number]  Received in basket request from Dr. [name] and Dr. Faylene Kurtz to review patient' current medications and make recommendations to assist with decreasing medication load.  Current medications and recent fills: Sertraline 100mg  90d 1/28 ([facility]) Donepezil 30d 1/27 gabap 335m 30d 1/27 memantine 10mg  30d 1/27 Midodrine 5mg  30d 1/27 mirtazapine 45mg  30d 1/27 primidone 50mg  30d 1/127 atorvastatin 10mg  90d 1/13 ([facility]) famotidine 20mg  90d 1/13 ([facility]) Montelukast 10g 90d 1/13 ([facility]) Trazodone 100mg  90d 1/13 ([facility]) Amitriptyline 25mg  90d 1/11 Albuterol Inhaler 1/4 ([facility]) B12 1/4 28d ([facility]) Syringe/needle for B12 11/9 90d ([facility]) Megestrol 12/30 90d ([facility]) Meloxicam 12/30 90d ([facility]) Methocarbamol 12/30 90d ([facility]) Omeprazole 12/30 90d ([facility]) Vit D 12/12 28d ([facility])  Possible Drug Interactions: -sertraline, amitriptyline, mirtazapine -used together increase risk of serotonin syndrome -sertraline and meloxicam- increase risk of GI bleed  Medications Not Recommended in Patients >65y/o -famotidine, methocarbamol, amitriptyline  RECOMMENDATIONS: -Stop famotidine and methocarbamol -Stop trazodone and amitriptyline by decreasing to one-half tablet for one week, then discontinue completely -Decrease mirtazapine to 30mg  nightly-lower doses have better sleep efficacy -Evaluate need for meloxicam; recommend only PRN if needed -Can consider stopping sertraline in the future, but I would not recommend at this time along with other medication adjustments  -I have contacted [facility] to request the hold off on filling any medications until patient is seen by Dr. [name] Monday 2/24 in case medication changes are made  [name], PharmD, DPLA

## 2023-04-25 NOTE — Progress Notes (Signed)
 Complex Care Management Note  Care Guide Note 04/25/2023 Name: Anne [name] MRN: [id number] DOB: 08/20/1945  Anne [name] is a 78 y.o. year old female who sees Cox, Kirsten, MD for primary care. I reached out to Anne [name] by phone today to offer complex care management services.  [name] was given information about Complex Care Management services today including:   The Complex Care Management services include support from the care team which includes your Nurse Care Manager, Clinical Social Worker, or Pharmacist.  The Complex Care Management team is here to help remove barriers to the health concerns and goals most important to you. Complex Care Management services are voluntary, and the patient may decline or stop services at any time by request to their care team member.   Complex Care Management Consent Status: Patient agreed to services and verbal consent obtained.   Follow up plan:  Telephone appointment with complex care management team member scheduled for:  2/21  Encounter Outcome:  Patient Scheduled Gwenevere Ghazi  [facility]  [facility], [facility] Guide  Direct Dial: [phone]  Fax [phone]

## 2023-04-26 ENCOUNTER — Telehealth: Payer: Self-pay | Admitting: *Deleted

## 2023-04-26 ENCOUNTER — Other Ambulatory Visit: Payer: Self-pay | Admitting: *Deleted

## 2023-04-26 NOTE — Patient Outreach (Signed)
  Care Management   Follow Up Note   04/26/2023 Name: RANIYA GOLEMBESKI MRN: [id number] DOB: 01/25/1946   Referred by: Blane Ohara, MD Reason for referral : Care Management (RNCM: ATTEMPT #2 Initial Outreach For Chronic Disease Management & Care Coordination Needs)   A second unsuccessful telephone outreach was attempted today. The patient was referred to the case management team for assistance with care management and care coordination.   Follow Up Plan: The care management team will reach out to the patient again over the next 7 days.   Larey Brick, BSN RN [facility], [facility] RN Care Manager Direct Dial: [phone]  Fax: [phone]

## 2023-04-28 NOTE — Progress Notes (Unsigned)
 Subjective:  Patient ID: Anne [name], female    DOB: Jun 13, 1945  Age: 78 y.o. MRN: [id number]  Chief Complaint  Patient presents with   Dementia    HPI A 78 year old female with COPD, chronic tobacco, use chronic respiratory failure on 2 L of oxygen, protein calorie malnutrition, with ambulatory dysfunction, who had generalized weakness resulting in a fall at home.  She was admitted on 1/30-04/06/2023.  She was confused and weak when she was found and EMS was called.  White blood count was 16,000, AST and ALT were mildly elevated, CRP was elevated.  Urine showed 2+ ketones, 2+ blood and contaminant with 84 epis.  Alcohol negative.  CT of the head was negative.  CT chest, abdomen, and pelvis with IV contrast showed large amount of stool burden with rectal stool ball measuring 7.6 x 6.5 cm without evidence of obstruction.  1.2 cm spiculated pulmonary nodule in the right upper lobe and a recommended CT scan in 3 months versus PET scan.  Also found to have a 1.2 cm right thyroid lobe nodule which a thyroid ultrasound was recommended for as an outpatient.  She was treated in the emergency department with IV normal saline and an enema which resulted in a large bowel movement.  She was admitted with acute metabolic encephalopathy with generalized weakness for dehydration.  She was also treated for COPD exacerbation with IV Solu-Medrol, DuoNeb, Pulmicort nebulizer and decongestant for cough.  Dietitian was consulted and was recommended to take nutrient nutritional supplements.  She was discharged home with home health physical therapy and skilled nursing. States she feels more fatigue today. Migraines are more frequent. Currently taking qlipta daily and amitriptyline 25 mg one before bed. Has been taking for one month. Migraines a couple times a week. Requesting refills of ubrelvy, hydrocodone/apap.   COPD: on breztri 2 puffs twice daily.   Insomnia: on melatonin 30 mg before bed. She is getting 4-6 hours  a night. On trazodone 100 mg before bed. Patient is on mirtazepine 45 mg before bed. Patient has failed on numerous medicines in the past.   Dementia: on namenda and aricept. Patient insists she can drive, but I am unsure of this. She complains of lightheadedness with positional changes. Patient currently is on midodrine 5 mg three times a day for hypotension. She continues to be forgetful.       04/01/2023    8:47 AM 03/13/2023    2:27 PM 01/30/2023    2:10 PM 01/10/2023   10:19 AM 01/10/2023   10:18 AM  Depression screen PHQ 2/9  Decreased Interest 1 2 2 2 2   Down, Depressed, Hopeless 1 2 2 3 3   PHQ - 2 Score 2 4 4 5 5   Altered sleeping 3 2 3 2 2   Tired, decreased energy 3 2 2 2    Change in appetite 2 2 2 3    Feeling bad or failure about yourself  0 0 0 0   Trouble concentrating 1 1 1 1    Moving slowly or fidgety/restless 1 0 0 1   Suicidal thoughts 0 0 0 0   PHQ-9 Score 12 11 12 14 7   Difficult doing work/chores  Somewhat difficult Somewhat difficult Somewhat difficult         04/01/2023    8:47 AM  Fall Risk   Falls in the past year? 1  Number falls in past yr: 1  Injury with Fall? 0  Risk for fall due to : History of fall(s)  Patient Care Team: Blane Ohara, MD as PCP - General (Internal Medicine) Glendale Chard, DO as Consulting Physician (Neurology) Marcellus Scott, MD as Referring Physician (Specialist)   Review of Systems  Constitutional:  Negative for chills, fatigue and fever.  HENT:  Negative for congestion, ear pain, rhinorrhea and sore throat.   Respiratory:  Positive for cough and shortness of breath.   Cardiovascular:  Positive for chest pain (feels like elephant sitting on her chest. related to anxiety.).  Gastrointestinal:  Negative for abdominal pain, constipation, diarrhea, nausea and vomiting.  Genitourinary:  Negative for dysuria and urgency.  Musculoskeletal:  Positive for back pain (stable.). Negative for myalgias.  Neurological:  Positive for  weakness, light-headedness and headaches. Negative for dizziness.  Psychiatric/Behavioral:  Positive for dysphoric mood and sleep disturbance. The patient is nervous/anxious.     Current Outpatient Medications on File Prior to Visit  Medication Sig Dispense Refill   albuterol (PROVENTIL) (2.5 MG/3ML) 0.083% nebulizer solution Take 3 mLs (2.5 mg total) by nebulization 4 (four) times daily. 360 mL 1   amitriptyline (ELAVIL) 25 MG tablet Take 1 tablet (25 mg total) by mouth at bedtime. 30 tablet 3   atorvastatin (LIPITOR) 10 MG tablet TAKE 1 TABLET EVERY DAY 90 tablet 1   Budeson-Glycopyrrol-Formoterol (BREZTRI AEROSPHERE) 160-9-4.8 MCG/ACT AERO Inhale 2 puffs into the lungs 2 (two) times daily. 3 each 3   cyanocobalamin (VITAMIN B12) 1000 MCG/ML injection Inject 1 mL (1,000 mcg total) every 30 (thirty) days as directed. 1 mL 6   donepezil (ARICEPT ODT) 10 MG disintegrating tablet Take 1 tablet (10 mg total) by mouth at bedtime. 90 tablet 1   famotidine (PEPCID) 20 MG tablet Take 1 tablet (20 mg total) by mouth 2 (two) times daily. 180 tablet 1   gabapentin (NEURONTIN) 300 MG capsule Take 1 capsule (300 mg total) by mouth 3 (three) times daily. 90 capsule 5   megestrol (MEGACE) 40 MG tablet Take 1 tablet (40 mg total) by mouth daily. 30 tablet 3   meloxicam (MOBIC) 15 MG tablet TAKE 1 TABLET EVERY DAY 90 tablet 0   memantine (NAMENDA) 10 MG tablet Take 1 tablet (10 mg total) by mouth 2 (two) times daily. 60 tablet 3   methocarbamol (ROBAXIN) 750 MG tablet Take 1 tablet (750 mg total) by mouth in the morning and at bedtime. 60 tablet 3   mirtazapine (REMERON) 45 MG tablet Take 1 tablet (45 mg total) by mouth at bedtime. 30 tablet 3   montelukast (SINGULAIR) 10 MG tablet Take 1 tablet (10 mg total) by mouth daily. 30 tablet 3   omeprazole (PRILOSEC) 40 MG capsule Take 1 capsule (40 mg total) by mouth 2 (two) times daily. 60 capsule 3   promethazine (PHENERGAN) 25 MG tablet Take 1 tablet (25 mg total)  by mouth every 8 (eight) hours as needed for nausea or vomiting. 90 tablet 0   sertraline (ZOLOFT) 100 MG tablet TAKE 1 TABLET EVERY DAY 90 tablet 3   SYRINGE-NEEDLE, DISP, 3 ML (LUER LOCK SAFETY SYRINGES) 25G X 1" 3 ML MISC Inject 1 each into the muscle every 30 (thirty) days. 50 each 0   Vitamin D, Ergocalciferol, (DRISDOL) 1.25 MG (50000 UNIT) CAPS capsule Take 1 capsule (50,000 Units total) by mouth every 7 (seven) days. 4 capsule 3   No current facility-administered medications on file prior to visit.   Past Medical History:  Diagnosis Date   Acquired absence of both breasts and nipples 06/08/2015   Acute infection  of nasal sinus 05/04/2021   Aortic atherosclerosis 01/30/2021   Asthma    daily inhalers   Ataxia 10/18/2020   Bilateral carpal tunnel syndrome 01/30/2021   Bipolar disorder, mixed 06/16/2021   Breast cancer of upper-inner quadrant of left female breast 04/15/2015   Bronchopneumonia 07/22/2022   Carotid artery disease 05/04/2016   Mild, 1-39% bilaterally by doppler in March 2018   Chronic pain in right shoulder 12/25/2020   COPD with acute exacerbation 07/03/2016   Dental crowns present    Drug induced constipation 04/10/2021   EIC (epidermal inclusion cyst) 03/21/2021   Family history of breast cancer in female 04/26/2015   Dx. Niece at 69; dx. Maternal aunt in her late 29s  Formatting of this note might be different from the original. Overview:  Dx. Niece at 68; dx. Maternal aunt in her late 85s   Family history of colon cancer 04/26/2015   Dx. In maternal grandfather in his late 21s-70  Formatting of this note might be different from the original. Overview:  Dx. In maternal grandfather in his late 28s-70   Frequent falls 10/18/2020   Genetic testing 06/02/2015   Negative for pathogenic mutations within any of 20 genes on the Breast/Ovarian Cancer Panel through [employer].  No variants of uncertain significance (VUSes) were found.  The Breast/Ovarian Cancer  Panel offered by [facility] Basilio Cairo, MD) includes sequencing and deletion/duplication analysis for the following 19 genes:  ATM, BARD1, BRCA1, BRCA2, BRIP1, CDH1, CHEK2, FANCC, M   History of basal cell carcinoma 04/26/2015   History of concussion 01/01/2014   History of kidney stones    History of TIA (transient ischemic attack) 10/2014   Hyperlipidemia    Insomnia    Lumbar back pain 10/18/2020   Major depressive disorder 10/18/2020   Malrotation of intestine 09/23/2017   Migraine aura without headache 02/08/2014   Migraine headache 01/30/2021   Nasal congestion 05/26/2015   will finish z-pak 05/27/2015   Nonproductive cough 05/26/2015   PONV (postoperative nausea and vomiting)    "long time ago"  none recently   Psychogenic syncope 04/23/2019   Right lumbar radiculopathy 04/23/2019   RLS (restless legs syndrome)    Simple chronic bronchitis 07/04/2019   Tobacco use    Tremor 10/18/2020   Vitamin D deficiency    Wears dentures    Weight loss 08/28/2019   Past Surgical History:  Procedure Laterality Date   ABDOMINAL HYSTERECTOMY     APPENDECTOMY  2000   BREAST BIOPSY Left    BREAST RECONSTRUCTION WITH PLACEMENT OF TISSUE EXPANDER AND FLEX HD (ACELLULAR HYDRATED DERMIS) Bilateral 06/02/2015   Procedure: BILATERAL BREAST RECONSTRUCTION WITH PLACEMENT OF TISSUE EXPANDER AND  ACELLULAR HYDRATED DERMIS;  Surgeon: Glenna Fellows, MD;  Location: [facility];  Service: Plastics;  Laterality: Bilateral;   CARPAL TUNNEL RELEASE Bilateral    CATARACT EXTRACTION Bilateral 09/2013   KNEE ARTHROSCOPY Left    LAPAROSCOPIC ABDOMINAL EXPLORATION N/A 10/2017   LAPAROSCOPIC LYSIS OF CONGENITAL ADHESIVE BAND/LADD'S BANDS WIH INTRAOPERATIVE CHOLANGIOGRAPHY. Perforation of small intestion requiring repair.    LOOP RECORDER INSERTION N/A 05/11/2016   Procedure: Loop Recorder Insertion;  Surgeon: Will Jorja Loa, MD;  Location: [facility];  Service:  Cardiovascular;  Laterality: N/A;   MASTECTOMY W/ SENTINEL NODE BIOPSY Bilateral 06/02/2015   Procedure: BILATERAL MASTECTOMY WITH LEFT SENTINEL LYMPH NODE BIOPSY;  Surgeon: Almond Lint, MD;  Location: [facility];  Service: General;  Laterality: Bilateral;   NASAL SEPTUM SURGERY  x 3   PLANTAR FASCIA SURGERY Left    REMOVAL OF BILATERAL TISSUE EXPANDERS WITH PLACEMENT OF BILATERAL BREAST IMPLANTS Bilateral 06/17/2015   Procedure: DEBRIDEMENT OF BILATERAL MASTECTOMY FLAP WITH BILATERAL TISSUE EXPANDER EXCHANGE ;  Surgeon: Glenna Fellows, MD;  Location: [facility];  Service: Plastics;  Laterality: Bilateral;   REMOVAL OF TISSUE EXPANDER Bilateral 07/12/2015   Procedure: REMOVAL OF BILATERAL TISSUE EXPANDERS;  Surgeon: Glenna Fellows, MD;  Location: [facility];  Service: Plastics;  Laterality: Bilateral;    Family History  Problem Relation Age of Onset   Cervical cancer Mother 39   Colon cancer Maternal Grandfather        mets to stomach; dx. 67-70   Heart disease Father    Prostate cancer Father 30   Cervical cancer Maternal Grandmother        dx. 52s; treated with radium implant   Lung cancer Sister 53       maternal half-sister dx. lung cancer, stage III   Breast cancer Maternal Aunt    Cervical cancer Sister 38       paternal half-sister; s/p TAH   Spina bifida Grandchild    Brain cancer Grandchild        grandson dx. at 20 mos, treated [facility]   Breast cancer Other 51       maternal half-sister's daughter   Cancer Maternal Aunt        d. 99; unspecified type - "began at back area and moved to vital organs"   Cancer Maternal Uncle         late 49s; unspecified type; "started at back and moved to vital organs"   Social History   Socioeconomic History   Marital status: Widowed    Spouse name: Not on file   Number of children: 1   Years of education: 41   Highest education level: Not on file  Occupational History    Comment: [employer],  retired  Tobacco Use   Smoking status: Every Day    Current packs/day: 0.25    Types: Cigarettes   Smokeless tobacco: Never  Vaping Use   Vaping status: Never Used  Substance and Sexual Activity   Alcohol use: Never   Drug use: No   Sexual activity: Not Currently  Other Topics Concern   Not on file  Social History Narrative   Lives alone, widow   Caffeine use- tea, 1 cup daily   Right Handed    Lives in a one story [place] home    Social Drivers of Health   Financial Resource Strain: Low Risk  (03/13/2023)   Overall Financial Resource Strain (CARDIA)    Difficulty of Paying Living Expenses: Not hard at all  Food Insecurity: No Food Insecurity (03/13/2023)   Hunger Vital Sign    Worried About Running Out of Food in the Last Year: Never true    Ran Out of Food in the Last Year: Never true  Transportation Needs: No Transportation Needs (03/13/2023)   PRAPARE - Administrator, Civil Service (Medical): No    Lack of Transportation (Non-Medical): No  Physical Activity: Inactive (03/13/2023)   Exercise Vital Sign    Days of Exercise per Week: 0 days    Minutes of Exercise per Session: 0 min  Stress: No Stress Concern Present (03/13/2023)   [employer] of Occupational Health - Occupational Stress Questionnaire    Feeling of Stress : Only a little  Social Connections: Socially Isolated (  03/13/2023)   Social Connection and Isolation Panel [NHANES]    Frequency of Communication with Friends and Family: Twice a week    Frequency of Social Gatherings with Friends and Family: Twice a week    Attends Religious Services: Never    Diplomatic Services operational officer: No    Attends Engineer, structural: Never    Marital Status: Divorced    Objective:  BP 118/78   Pulse 97   Temp 97.8 F (36.6 C)   Ht 5' 1.25" (1.556 m)   Wt 91 lb 9.6 oz (41.5 kg)   SpO2 98%   BMI 17.17 kg/m      04/29/2023    3:26 PM 04/01/2023    8:43 AM 03/13/2023    1:54 PM  BP/Weight   Systolic BP 118 100 122  Diastolic BP 78 60 68  Wt. (Lbs) 91.6 85 89.6  BMI 17.17 kg/m2 15.93 kg/m2 16.79 kg/m2    Physical Exam Vitals reviewed.  Constitutional:      Appearance: She is not ill-appearing.     Comments: Thin.  HENT:     Nose:     Comments: Right nostril small sore medial.     Mouth/Throat:     Pharynx: Oropharynx is clear.  Cardiovascular:     Rate and Rhythm: Normal rate and regular rhythm.     Heart sounds: Normal heart sounds. No murmur heard. Pulmonary:     Effort: Pulmonary effort is normal. No respiratory distress.     Breath sounds: Wheezing (rt lower lobe and rt middle lobe.) present.  Lymphadenopathy:     Cervical: No cervical adenopathy.  Neurological:     Mental Status: She is alert.  Psychiatric:        Mood and Affect: Mood normal.        Behavior: Behavior normal.     Diabetic Foot Exam - Simple   No data filed      Lab Results  Component Value Date   WBC 11.9 (H) 04/01/2023   HGB 12.0 04/01/2023   HCT 38.1 04/01/2023   PLT 229 04/01/2023   GLUCOSE 84 04/01/2023   CHOL 171 09/19/2022   TRIG 100 09/19/2022   HDL 49 09/19/2022   LDLCALC 104 (H) 09/19/2022   ALT 13 04/01/2023   AST 19 04/01/2023   NA 141 04/01/2023   K 3.3 (L) 04/01/2023   CL 106 04/01/2023   CREATININE 1.04 (H) 04/01/2023   BUN 25 04/01/2023   CO2 21 04/01/2023   TSH 3.770 04/01/2023   HGBA1C 5.9 (H) 04/01/2023      Assessment & Plan:    Migraine aura without headache Assessment & Plan: Toradol shot given.  Eleonore Chiquito refilled.  Hydrocodone/apap for severe migraines.   Orders: Bernita Raisin; One tablet at onset of migraine, if needed a second dose may be taken at least 2 hours after the initial dose  Dispense: 10 tablet; Refill: 2 -     Ketorolac Tromethamine -     [name]; Take 1 tablet (60 mg total) by mouth daily.  Dispense: 90 tablet; Refill: 3  Right lumbar radiculopathy -     HYDROcodone-Acetaminophen; Take 1 tablet by mouth daily as  needed for severe pain (migraines).  Dispense: 30 tablet; Refill: 0  Orthostatic hypotension Assessment & Plan: Continue midodrine 5 mg three times a day.    Moderate Alzheimer's dementia of other onset with other behavioral disturbance [facility]) Assessment & Plan: I refused to  clear her to drive.  I am concerned about her safety.     Other fatigue Assessment & Plan: Secondary to poor sleep.    Moderate episode of recurrent major depressive disorder [facility]) Assessment & Plan: On sertraline, dose increased to 200 mg by Dr. [name], in November 2024, unclear if taking increased dose. Also on amitriptyline and trazodone. Discussed potential benefits of increasing sertraline to 200 mg, noting it is the maximum dose. - Confirm current sertraline dose - Increase sertraline to 200 mg if not already done - Hold off on adding new medications until current regimen is confirmed  THOUGHT FOR FUTURE-IF patient continues to have depression, insomnia and decreased appetite, instead of taking Zoloft, trazodone, amitriptyline and megestrol, we could have her on mirtazapine/Remeron and titrate the dose up to see if it helps with all her symptoms   Severe protein-calorie malnutrition Lily Kocher: less than 60% of standard weight) (HCC) Assessment & Plan: Continue protein drinks.    Sleep disorder Assessment & Plan: Continue trazodone, melatonin.   Orders: -     traZODone HCl; Take 1 tablet (100 mg total) by mouth at bedtime. for sleep  Dispense: 30 tablet; Refill: 5  Tremor -     Primidone; Take 1 tablet (50 mg total) by mouth at bedtime.  Dispense: 30 tablet; Refill: 5  Mixed simple and mucopurulent chronic bronchitis (HCC) Assessment & Plan: Continue breztri 2 puffs twice daily.  Continue albuterol  Recommend quit smoking.   Orders: -     Albuterol Sulfate HFA; INHALE 1 TO 2 PUFFS INTO LUNGS EVERY 6 HOURS AS NEEDED FOR  WHEEZING  AND/OR  SHORTNESS  OF  BREATH  Dispense: 18 g; Refill: 5      Meds ordered this encounter  Medications   Ubrogepant (UBRELVY) 100 MG TABS    Sig: One tablet at onset of migraine, if needed a second dose may be taken at least 2 hours after the initial dose    Dispense:  10 tablet    Refill:  2   albuterol (VENTOLIN HFA) 108 (90 Base) MCG/ACT inhaler    Sig: INHALE 1 TO 2 PUFFS INTO LUNGS EVERY 6 HOURS AS NEEDED FOR  WHEEZING  AND/OR  SHORTNESS  OF  BREATH    Dispense:  18 g    Refill:  5   DISCONTD: Atogepant ([name]) 60 MG TABS    Sig: Take 1 tablet (60 mg total) by mouth daily.    Dispense:  90 tablet    Refill:  3   primidone (MYSOLINE) 50 MG tablet    Sig: Take 1 tablet (50 mg total) by mouth at bedtime.    Dispense:  30 tablet    Refill:  5   ketorolac (TORADOL) injection 60 mg   Atogepant ([name]) 60 MG TABS    Sig: Take 1 tablet (60 mg total) by mouth daily.    Dispense:  90 tablet    Refill:  3   HYDROcodone-acetaminophen (NORCO/VICODIN) 5-325 MG tablet    Sig: Take 1 tablet by mouth daily as needed for severe pain (migraines).    Dispense:  30 tablet    Refill:  0   traZODone (DESYREL) 100 MG tablet    Sig: Take 1 tablet (100 mg total) by mouth at bedtime. for sleep    Dispense:  30 tablet    Refill:  5    No orders of the defined types were placed in this encounter.    Follow-up: Return in about 3 months (  around 07/27/2023).   I,Marla I Leal-Borjas,acting as a scribe for Blane Ohara, MD.,have documented all relevant documentation on the behalf of Blane Ohara, MD,as directed by  Blane Ohara, MD while in the presence of Blane Ohara, MD.   An After Visit Summary was printed and given to the patient.  I attest that I have reviewed this visit and agree with the plan scribed by my staff.   Blane Ohara, MD [facility] [phone]

## 2023-04-29 ENCOUNTER — Other Ambulatory Visit: Payer: Self-pay

## 2023-04-29 ENCOUNTER — Encounter: Payer: Self-pay | Admitting: Family Medicine

## 2023-04-29 ENCOUNTER — Ambulatory Visit (INDEPENDENT_AMBULATORY_CARE_PROVIDER_SITE_OTHER): Payer: Medicare HMO | Admitting: Family Medicine

## 2023-04-29 ENCOUNTER — Other Ambulatory Visit (HOSPITAL_COMMUNITY): Payer: Self-pay

## 2023-04-29 VITALS — BP 118/78 | HR 97 | Temp 97.8°F | Ht 61.25 in | Wt 91.6 lb

## 2023-04-29 DIAGNOSIS — F02B18 Dementia in other diseases classified elsewhere, moderate, with other behavioral disturbance: Secondary | ICD-10-CM

## 2023-04-29 DIAGNOSIS — E43 Unspecified severe protein-calorie malnutrition: Secondary | ICD-10-CM

## 2023-04-29 DIAGNOSIS — G479 Sleep disorder, unspecified: Secondary | ICD-10-CM | POA: Diagnosis not present

## 2023-04-29 DIAGNOSIS — R5383 Other fatigue: Secondary | ICD-10-CM

## 2023-04-29 DIAGNOSIS — I951 Orthostatic hypotension: Secondary | ICD-10-CM

## 2023-04-29 DIAGNOSIS — M5416 Radiculopathy, lumbar region: Secondary | ICD-10-CM | POA: Diagnosis not present

## 2023-04-29 DIAGNOSIS — F331 Major depressive disorder, recurrent, moderate: Secondary | ICD-10-CM

## 2023-04-29 DIAGNOSIS — J418 Mixed simple and mucopurulent chronic bronchitis: Secondary | ICD-10-CM | POA: Diagnosis not present

## 2023-04-29 DIAGNOSIS — G43109 Migraine with aura, not intractable, without status migrainosus: Secondary | ICD-10-CM

## 2023-04-29 DIAGNOSIS — R251 Tremor, unspecified: Secondary | ICD-10-CM

## 2023-04-29 DIAGNOSIS — G308 Other Alzheimer's disease: Secondary | ICD-10-CM | POA: Diagnosis not present

## 2023-04-29 MED ORDER — TRAZODONE HCL 100 MG PO TABS
100.0000 mg | ORAL_TABLET | Freq: Every day | ORAL | 5 refills | Status: DC
  Filled 2023-04-29 – 2023-05-27 (×5): qty 30, 30d supply, fill #0

## 2023-04-29 MED ORDER — KETOROLAC TROMETHAMINE 60 MG/2ML IM SOLN
60.0000 mg | Freq: Once | INTRAMUSCULAR | Status: AC
  Administered 2023-04-29: 60 mg via INTRAMUSCULAR

## 2023-04-29 MED ORDER — HYDROCODONE-ACETAMINOPHEN 5-325 MG PO TABS
1.0000 | ORAL_TABLET | Freq: Every day | ORAL | 0 refills | Status: DC | PRN
  Filled 2023-04-29: qty 30, 30d supply, fill #0

## 2023-04-29 MED ORDER — PRIMIDONE 50 MG PO TABS
50.0000 mg | ORAL_TABLET | Freq: Every day | ORAL | 5 refills | Status: DC
  Filled 2023-04-29 – 2023-05-27 (×5): qty 30, 30d supply, fill #0

## 2023-04-29 MED ORDER — QULIPTA 60 MG PO TABS
60.0000 mg | ORAL_TABLET | Freq: Every day | ORAL | 3 refills | Status: DC
  Filled 2023-04-29: qty 90, 90d supply, fill #0

## 2023-04-29 MED ORDER — UBRELVY 100 MG PO TABS
ORAL_TABLET | ORAL | 2 refills | Status: DC
  Filled 2023-04-29: qty 10, 30d supply, fill #0

## 2023-04-29 MED ORDER — QULIPTA 60 MG PO TABS
60.0000 mg | ORAL_TABLET | Freq: Every day | ORAL | 3 refills | Status: AC
Start: 2023-04-29 — End: ?

## 2023-04-29 MED ORDER — ALBUTEROL SULFATE HFA 108 (90 BASE) MCG/ACT IN AERS
1.0000 | INHALATION_SPRAY | Freq: Four times a day (QID) | RESPIRATORY_TRACT | 5 refills | Status: DC | PRN
  Filled 2023-04-29 – 2023-05-06 (×2): qty 18, 25d supply, fill #0

## 2023-04-29 MED ORDER — MIDODRINE HCL 5 MG PO TABS
5.0000 mg | ORAL_TABLET | Freq: Three times a day (TID) | ORAL | 1 refills | Status: DC
  Filled 2023-05-21 – 2023-05-27 (×4): qty 90, 30d supply, fill #0

## 2023-04-29 NOTE — Assessment & Plan Note (Signed)
 Toradol shot given.  Anne [name] refilled.  Hydrocodone/apap for severe migraines.

## 2023-04-29 NOTE — Assessment & Plan Note (Signed)
 On sertraline, dose increased to 200 mg by Dr. [name], in November 2024, unclear if taking increased dose. Also on amitriptyline and trazodone. Discussed potential benefits of increasing sertraline to 200 mg, noting it is the maximum dose. - Confirm current sertraline dose - Increase sertraline to 200 mg if not already done - Hold off on adding new medications until current regimen is confirmed  THOUGHT FOR FUTURE-IF patient continues to have depression, insomnia and decreased appetite, instead of taking Zoloft, trazodone, amitriptyline and megestrol, we could have her on mirtazapine/Remeron and titrate the dose up to see if it helps with all her symptoms

## 2023-04-29 NOTE — Assessment & Plan Note (Signed)
 Continue midodrine 5 mg three times a day.

## 2023-04-29 NOTE — Assessment & Plan Note (Signed)
 Continue protein drinks.

## 2023-04-29 NOTE — Assessment & Plan Note (Signed)
 Continue trazodone, melatonin.  ?

## 2023-04-29 NOTE — Assessment & Plan Note (Signed)
 I refused to clear her to drive.  I am concerned about her safety.

## 2023-04-29 NOTE — Assessment & Plan Note (Signed)
 Secondary to poor sleep.

## 2023-04-29 NOTE — Assessment & Plan Note (Signed)
 Continue breztri 2 puffs twice daily.  Continue albuterol  Recommend quit smoking.

## 2023-04-29 NOTE — Patient Instructions (Addendum)
 I do not recommend you drive. I am concerned about your safety, as well as other peoples' safety.   If you insist, you may look into the following companies to do an assessment for you.   If you are interested in a driving assessment, you can contact the following: Evaluator driving company in [place] [phone]. 2.   [employer] [facility] [phone]  3.   [facility] [phone]  4.   [facility] [phone] or [phone]

## 2023-04-30 ENCOUNTER — Other Ambulatory Visit: Payer: Self-pay

## 2023-04-30 ENCOUNTER — Other Ambulatory Visit (HOSPITAL_COMMUNITY): Payer: Self-pay

## 2023-04-30 DIAGNOSIS — J4489 Other specified chronic obstructive pulmonary disease: Secondary | ICD-10-CM | POA: Diagnosis not present

## 2023-04-30 DIAGNOSIS — E43 Unspecified severe protein-calorie malnutrition: Secondary | ICD-10-CM | POA: Diagnosis not present

## 2023-04-30 DIAGNOSIS — F0394 Unspecified dementia, unspecified severity, with anxiety: Secondary | ICD-10-CM | POA: Diagnosis not present

## 2023-04-30 DIAGNOSIS — F3160 Bipolar disorder, current episode mixed, unspecified: Secondary | ICD-10-CM | POA: Diagnosis not present

## 2023-04-30 DIAGNOSIS — G4700 Insomnia, unspecified: Secondary | ICD-10-CM | POA: Diagnosis not present

## 2023-04-30 DIAGNOSIS — G43109 Migraine with aura, not intractable, without status migrainosus: Secondary | ICD-10-CM

## 2023-04-30 DIAGNOSIS — F0393 Unspecified dementia, unspecified severity, with mood disturbance: Secondary | ICD-10-CM | POA: Diagnosis not present

## 2023-04-30 DIAGNOSIS — I700 Atherosclerosis of aorta: Secondary | ICD-10-CM | POA: Diagnosis not present

## 2023-04-30 DIAGNOSIS — F03918 Unspecified dementia, unspecified severity, with other behavioral disturbance: Secondary | ICD-10-CM | POA: Diagnosis not present

## 2023-04-30 DIAGNOSIS — J9611 Chronic respiratory failure with hypoxia: Secondary | ICD-10-CM | POA: Diagnosis not present

## 2023-04-30 MED ORDER — UBRELVY 100 MG PO TABS: ORAL_TABLET | ORAL | 2 refills | Status: AC

## 2023-05-01 ENCOUNTER — Other Ambulatory Visit: Payer: Self-pay

## 2023-05-01 ENCOUNTER — Telehealth: Payer: Self-pay

## 2023-05-01 DIAGNOSIS — F332 Major depressive disorder, recurrent severe without psychotic features: Secondary | ICD-10-CM

## 2023-05-01 DIAGNOSIS — E782 Mixed hyperlipidemia: Secondary | ICD-10-CM

## 2023-05-01 NOTE — Progress Notes (Signed)
   05/01/2023  Patient ID: Anne [name], female   DOB: [birth year], 78 y.o.   MRN: [id number]  Patient was seen by PCP, Dr. [name], Monday; and refills were sent to [facility] for processing.  Joycie Peek was also sent to PAP program processing pharmacy, as patient has been approved to receive this medication at no cost through the manufacturer.  Provider states it appears patient may still be getting medications from [facility] and [facility].  Contacted [facility] to verify they have atorvastatin and sertraline on patient's profile, because these appear to have last been sent to either [facility] or [facility].  They will need new prescriptions from Dr. [name] for these- I will collaborate with her to get these to the pharmacy.  Contacting [facility] and [facility] to discontinue any active prescriptions to prevent duplicate fills and additional confusion.  [name], PharmD, DPLA

## 2023-05-02 ENCOUNTER — Other Ambulatory Visit: Payer: Self-pay

## 2023-05-02 ENCOUNTER — Other Ambulatory Visit (HOSPITAL_COMMUNITY): Payer: Self-pay

## 2023-05-02 MED ORDER — ATORVASTATIN CALCIUM 10 MG PO TABS
10.0000 mg | ORAL_TABLET | Freq: Every day | ORAL | 1 refills | Status: DC
  Filled 2023-05-02: qty 90, 90d supply, fill #0
  Filled 2023-05-02: qty 30, 30d supply, fill #0
  Filled 2023-05-06 – 2023-05-21 (×2): qty 90, 90d supply, fill #0
  Filled 2023-05-23: qty 30, 30d supply, fill #0
  Filled 2023-05-27: qty 90, 90d supply, fill #0

## 2023-05-02 MED ORDER — SERTRALINE HCL 100 MG PO TABS
100.0000 mg | ORAL_TABLET | Freq: Every day | ORAL | 1 refills | Status: DC
  Filled 2023-05-02: qty 90, 90d supply, fill #0
  Filled 2023-05-02: qty 30, 30d supply, fill #0
  Filled 2023-05-06 – 2023-05-23 (×3): qty 90, 90d supply, fill #0

## 2023-05-02 NOTE — Progress Notes (Signed)
   05/02/2023  Patient ID: Anne [name], female   DOB: 09-Apr-1945, 78 y.o.   MRN: [id number]  Pending prescriptions for atorvastatin and sertraline to go to [facility], so all maintenance medications are on file for adherence packaging and home delivery.  [name], PharmD, DPLA

## 2023-05-03 ENCOUNTER — Ambulatory Visit: Payer: Self-pay | Admitting: Family Medicine

## 2023-05-03 ENCOUNTER — Other Ambulatory Visit (HOSPITAL_COMMUNITY): Payer: Self-pay

## 2023-05-03 ENCOUNTER — Other Ambulatory Visit: Payer: Self-pay | Admitting: Family Medicine

## 2023-05-03 ENCOUNTER — Other Ambulatory Visit: Payer: Self-pay

## 2023-05-03 ENCOUNTER — Other Ambulatory Visit (HOSPITAL_BASED_OUTPATIENT_CLINIC_OR_DEPARTMENT_OTHER): Payer: Self-pay

## 2023-05-03 DIAGNOSIS — M5416 Radiculopathy, lumbar region: Secondary | ICD-10-CM

## 2023-05-03 DIAGNOSIS — E43 Unspecified severe protein-calorie malnutrition: Secondary | ICD-10-CM

## 2023-05-03 DIAGNOSIS — F03A3 Unspecified dementia, mild, with mood disturbance: Secondary | ICD-10-CM

## 2023-05-03 DIAGNOSIS — J441 Chronic obstructive pulmonary disease with (acute) exacerbation: Secondary | ICD-10-CM

## 2023-05-03 MED ORDER — METHOCARBAMOL 750 MG PO TABS
750.0000 mg | ORAL_TABLET | Freq: Two times a day (BID) | ORAL | 3 refills | Status: DC
  Filled 2023-05-03 – 2023-05-27 (×5): qty 60, 30d supply, fill #0

## 2023-05-03 MED ORDER — AMITRIPTYLINE HCL 25 MG PO TABS
25.0000 mg | ORAL_TABLET | Freq: Every day | ORAL | 3 refills | Status: DC
  Filled 2023-05-03 – 2023-05-27 (×5): qty 30, 30d supply, fill #0

## 2023-05-03 MED ORDER — MELOXICAM 15 MG PO TABS
15.0000 mg | ORAL_TABLET | Freq: Every day | ORAL | 3 refills | Status: DC
  Filled 2023-05-03 – 2023-05-27 (×5): qty 30, 30d supply, fill #0

## 2023-05-03 MED ORDER — GABAPENTIN 300 MG PO CAPS
300.0000 mg | ORAL_CAPSULE | Freq: Three times a day (TID) | ORAL | 5 refills | Status: DC
  Filled 2023-05-03 – 2023-05-27 (×5): qty 90, 30d supply, fill #0

## 2023-05-03 MED ORDER — OMEPRAZOLE 40 MG PO CPDR
40.0000 mg | DELAYED_RELEASE_CAPSULE | Freq: Two times a day (BID) | ORAL | 3 refills | Status: DC
  Filled 2023-05-03 – 2023-05-27 (×5): qty 60, 30d supply, fill #0

## 2023-05-03 MED ORDER — MEMANTINE HCL 10 MG PO TABS
10.0000 mg | ORAL_TABLET | Freq: Two times a day (BID) | ORAL | 3 refills | Status: DC
  Filled 2023-05-03 – 2023-05-27 (×5): qty 60, 30d supply, fill #0

## 2023-05-03 MED ORDER — FAMOTIDINE 20 MG PO TABS
20.0000 mg | ORAL_TABLET | Freq: Two times a day (BID) | ORAL | 1 refills | Status: DC
  Filled 2023-05-03 – 2023-05-21 (×3): qty 180, 90d supply, fill #0
  Filled 2023-05-23: qty 60, 30d supply, fill #0
  Filled 2023-05-27: qty 180, 90d supply, fill #0

## 2023-05-03 MED ORDER — MEGESTROL ACETATE 40 MG PO TABS
40.0000 mg | ORAL_TABLET | Freq: Every day | ORAL | 3 refills | Status: DC
  Filled 2023-05-03 – 2023-05-27 (×5): qty 30, 30d supply, fill #0

## 2023-05-03 MED ORDER — DONEPEZIL HCL 10 MG PO TBDP
10.0000 mg | ORAL_TABLET | Freq: Every day | ORAL | 3 refills | Status: DC
  Filled 2023-05-03 – 2023-05-27 (×5): qty 30, 30d supply, fill #0

## 2023-05-03 NOTE — Progress Notes (Addendum)
   05/03/2023  Patient ID: Anne [name], female   DOB: 01-20-1946, 78 y.o.   MRN: [id number]  Contacted [facility] and [facility] pharmacies to deactivate any active prescriptions, as all active medications are currently on profile for adherence packaging and home delivery through [facility] in an effort to assist with medication adherence.  [name], PharmD, DPLA

## 2023-05-03 NOTE — Telephone Encounter (Signed)
 Copied from CRM [id number]. Topic: Clinical - Prescription Issue >> May 03, 2023  4:45 PM Antony Haste wrote: Reason for CRM: Patient is calling to follow-up on her previous prescription issue a prescription refill for HYDROcodone-acetaminophen (NORCO/VICODIN) 5-325 MG tablet for her migraines. She requested this to be sent to [facility] in [place] on 02/25 and still has not received an update.  I informed the patient of the office's early closing today. She is still requesting for this to be sent to:  [facility], [place] - [address] [facility] [place] [address] Phone: [phone] Fax: [phone] Hours: Not open 24 hours

## 2023-05-03 NOTE — Telephone Encounter (Signed)
 Copied from CRM [id number]. Topic: Clinical - Prescription Issue >> May 03, 2023 12:45 PM Antwanette L wrote: Reason for CRM: Patient is calling about a prescription refill for HYDROcodone-acetaminophen (NORCO/VICODIN) 5-325 MG tablet and primidone (MYSOLINE) 50 MG tablet. Dr. Mickey Farber sent the refills to the wrong pharmacy and the patient needs the medicine today. Patient is requesting a callback at [phone]. Listed below is the patient preferred pharmacy  [facility] [address] [facility], [place] - [address] [facility] [place] [address] Phone: [phone] Fax: [phone] Hours: Not open 24 hours

## 2023-05-03 NOTE — Telephone Encounter (Signed)
  Chief Complaint: Migraine Symptoms: nausea, blurry vision Frequency: since 1100 this AM Pertinent Negatives: Patient denies fever,  Disposition: [] ED /[x] Urgent Care (no appt availability in office) / [] Appointment(In office/virtual)/ []  [place] [facility]/ [] Home Care/ [] Refused Recommended Disposition /[] [place] [facility]/ []  Follow-up with PCP Additional Notes: Pt reports she has been experiencing migraine today since 1100, reports nausea and blurry vision. Denies fever, vomiting, dizziness. Pt out of her pain medication and is requesting refills. Advised pt it can be up to 3 days for script refills to be authorized. No available OV today, pt unable to do virtual. Pt's son will drive her to UC for eval/treat. This RN educated pt on home care, new-worsening symptoms, when to call back/seek emergent care. Pt verbalized understanding and agrees to plan.    Reason for Disposition  [1] SEVERE headache (e.g., excruciating) AND [2] not improved after 2 hours of pain medicine  Answer Assessment - Initial Assessment Questions 1. LOCATION: "Where does it hurt?"      Across full forehead 2. ONSET: "When did the headache start?" (Minutes, hours or days)      1100 3. PATTERN: "Does the pain come and go, or has it been constant since it started?"     Constant 4. SEVERITY: "How bad is the pain?" and "What does it keep you from doing?"  (e.g., Scale 1-10; mild, moderate, or severe)   - MILD (1-3): doesn't interfere with normal activities    - MODERATE (4-7): interferes with normal activities or awakens from sleep    - SEVERE (8-10): excruciating pain, unable to do any normal activities        10/10 5. RECURRENT SYMPTOM: "Have you ever had headaches before?" If Yes, ask: "When was the last time?" and "What happened that time?"      Yes 6. CAUSE: "What do you think is causing the headache?"     History of migraines 7. MIGRAINE: "Have you been diagnosed with migraine headaches?" If Yes, ask:  "Is this headache similar?"      Yes  9. OTHER SYMPTOMS: "Do you have any other symptoms?" (fever, stiff neck, eye pain, sore throat, cold symptoms)     Blurred vision, nausea  Protocols used: Headache-A-AH

## 2023-05-06 ENCOUNTER — Encounter: Payer: Self-pay | Admitting: Family Medicine

## 2023-05-06 ENCOUNTER — Other Ambulatory Visit: Payer: Self-pay

## 2023-05-06 ENCOUNTER — Ambulatory Visit (INDEPENDENT_AMBULATORY_CARE_PROVIDER_SITE_OTHER): Admitting: Family Medicine

## 2023-05-06 VITALS — BP 136/72 | HR 74 | Temp 97.6°F | Resp 16 | Ht 61.25 in | Wt 93.0 lb

## 2023-05-06 DIAGNOSIS — R918 Other nonspecific abnormal finding of lung field: Secondary | ICD-10-CM | POA: Diagnosis not present

## 2023-05-06 DIAGNOSIS — J0100 Acute maxillary sinusitis, unspecified: Secondary | ICD-10-CM | POA: Insufficient documentation

## 2023-05-06 DIAGNOSIS — E041 Nontoxic single thyroid nodule: Secondary | ICD-10-CM | POA: Diagnosis not present

## 2023-05-06 DIAGNOSIS — G43109 Migraine with aura, not intractable, without status migrainosus: Secondary | ICD-10-CM

## 2023-05-06 MED ORDER — HYDROCODONE-ACETAMINOPHEN 5-325 MG PO TABS: 1.0000 | ORAL_TABLET | Freq: Every day | ORAL | 0 refills | Status: AC | PRN

## 2023-05-06 MED ORDER — AZITHROMYCIN 250 MG PO TABS
ORAL_TABLET | ORAL | 0 refills | Status: AC
Start: 2023-05-06 — End: ?

## 2023-05-06 NOTE — Assessment & Plan Note (Signed)
 Check PET scan.

## 2023-05-06 NOTE — Progress Notes (Signed)
   05/06/2023  Patient ID: Anne [name], female   DOB: 1946-01-29, 78 y.o.   MRN: [id number]  Received a secure chat from Dr. [name] stating patient and son were at her office stating patient is in need of refills for her medications.  All current medications are available and on file at [facility] outpatient pharmacy.  Contacted pharmacy and Gabapentin, Memantine, Midodrine, Mirtazapine, Primodone, Donepezil were all filled and delivered to patient last week.  All other medications are currently too soon to fill based on last refills completed.  PCP made aware, and I attempted to contact patient's son (DPR) to review what medications patient should have at home and potentially schedule an in person visit to meet with him and his mother.  [name], PharmD, DPLA

## 2023-05-06 NOTE — Assessment & Plan Note (Signed)
 Ubrelvy 100 mg given once.  Qlipta once daily. 14/0 samples. Awaiting PAP.

## 2023-05-06 NOTE — Patient Instructions (Addendum)
 Qlipta samples given. Once daily to help prevent migraines.  Given Ubrelby x 1 in the office.  Sent zithromax to treat a sinus infection.  Sent hydrocodone/apap 5/325 mg one daily as needed for severe headache.   Scheduling a thyroid ultrasound.  Scheduling a PET scan of lungs.

## 2023-05-06 NOTE — Assessment & Plan Note (Signed)
Start on zpack.

## 2023-05-06 NOTE — Assessment & Plan Note (Signed)
Check thyroid US. 

## 2023-05-06 NOTE — Progress Notes (Signed)
 Acute Office Visit  Subjective:    Patient ID: Anne [name], female    DOB: 10/28/1945, 78 y.o.   MRN: [id number]  Chief Complaint  Patient presents with   Migraine    HPI: Patient is in today for migraine x 4 days, nausea, blurred vision, has tried taking ASA due to needing refills on rizatriptan and hydrocodone. Patient has brought a list of medications that she needs, but when I discussed with her several medicines on her list she is no longer taking. She is not taking quetiapine, prednisone, eliquis.   On her recent visit to the hospital she had RUL spiculated nodule 1.2 cm and a right thyroid nodule 1.2 cm. Pet scan and [name] of thyroid recommended, respectively.    Past Medical History:  Diagnosis Date   Acquired absence of both breasts and nipples 06/08/2015   Acute infection of nasal sinus 05/04/2021   Aortic atherosclerosis 01/30/2021   Asthma    daily inhalers   Ataxia 10/18/2020   Bilateral carpal tunnel syndrome 01/30/2021   Bipolar disorder, mixed 06/16/2021   Breast cancer of upper-inner quadrant of left female breast 04/15/2015   Bronchopneumonia 07/22/2022   Carotid artery disease 05/04/2016   Mild, 1-39% bilaterally by doppler in March 2018   Chronic pain in right shoulder 12/25/2020   COPD with acute exacerbation 07/03/2016   Dental crowns present    Drug induced constipation 04/10/2021   EIC (epidermal inclusion cyst) 03/21/2021   Family history of breast cancer in female 04/26/2015   Dx. Niece at 53; dx. Maternal aunt in her late 23s  Formatting of this note might be different from the original. Overview:  Dx. Niece at 49; dx. Maternal aunt in her late 65s   Family history of colon cancer 04/26/2015   Dx. In maternal grandfather in his late 50s-70  Formatting of this note might be different from the original. Overview:  Dx. In maternal grandfather in his late 41s-70   Frequent falls 10/18/2020   Genetic testing 06/02/2015   Negative for pathogenic  mutations within any of 20 genes on the Breast/Ovarian Cancer Panel through [employer].  No variants of uncertain significance (VUSes) were found.  The Breast/Ovarian Cancer Panel offered by [facility] Basilio Cairo, MD) includes sequencing and deletion/duplication analysis for the following 19 genes:  ATM, BARD1, BRCA1, BRCA2, BRIP1, CDH1, CHEK2, FANCC, M   History of basal cell carcinoma 04/26/2015   History of concussion 01/01/2014   History of kidney stones    History of TIA (transient ischemic attack) 10/2014   Hyperlipidemia    Insomnia    Lumbar back pain 10/18/2020   Major depressive disorder 10/18/2020   Malrotation of intestine 09/23/2017   Migraine aura without headache 02/08/2014   Migraine headache 01/30/2021   Nasal congestion 05/26/2015   will finish z-pak 05/27/2015   Nonproductive cough 05/26/2015   PONV (postoperative nausea and vomiting)    "long time ago"  none recently   Psychogenic syncope 04/23/2019   Right lumbar radiculopathy 04/23/2019   RLS (restless legs syndrome)    Simple chronic bronchitis 07/04/2019   Tobacco use    Tremor 10/18/2020   Vitamin D deficiency    Wears dentures    Weight loss 08/28/2019    Past Surgical History:  Procedure Laterality Date   ABDOMINAL HYSTERECTOMY     APPENDECTOMY  2000   BREAST BIOPSY Left    BREAST RECONSTRUCTION WITH PLACEMENT OF TISSUE EXPANDER AND FLEX HD (ACELLULAR HYDRATED DERMIS) Bilateral 06/02/2015  Procedure: BILATERAL BREAST RECONSTRUCTION WITH PLACEMENT OF TISSUE EXPANDER AND  ACELLULAR HYDRATED DERMIS;  Surgeon: Glenna Fellows, MD;  Location: [facility];  Service: Plastics;  Laterality: Bilateral;   CARPAL TUNNEL RELEASE Bilateral    CATARACT EXTRACTION Bilateral 09/2013   KNEE ARTHROSCOPY Left    LAPAROSCOPIC ABDOMINAL EXPLORATION N/A 10/2017   LAPAROSCOPIC LYSIS OF CONGENITAL ADHESIVE BAND/LADD'S BANDS WIH INTRAOPERATIVE CHOLANGIOGRAPHY. Perforation of small intestion  requiring repair.    LOOP RECORDER INSERTION N/A 05/11/2016   Procedure: Loop Recorder Insertion;  Surgeon: Will Jorja Loa, MD;  Location: [facility];  Service: Cardiovascular;  Laterality: N/A;   MASTECTOMY W/ SENTINEL NODE BIOPSY Bilateral 06/02/2015   Procedure: BILATERAL MASTECTOMY WITH LEFT SENTINEL LYMPH NODE BIOPSY;  Surgeon: Almond Lint, MD;  Location: [facility];  Service: General;  Laterality: Bilateral;   NASAL SEPTUM SURGERY     x 3   PLANTAR FASCIA SURGERY Left    REMOVAL OF BILATERAL TISSUE EXPANDERS WITH PLACEMENT OF BILATERAL BREAST IMPLANTS Bilateral 06/17/2015   Procedure: DEBRIDEMENT OF BILATERAL MASTECTOMY FLAP WITH BILATERAL TISSUE EXPANDER EXCHANGE ;  Surgeon: Glenna Fellows, MD;  Location: [facility];  Service: Plastics;  Laterality: Bilateral;   REMOVAL OF TISSUE EXPANDER Bilateral 07/12/2015   Procedure: REMOVAL OF BILATERAL TISSUE EXPANDERS;  Surgeon: Glenna Fellows, MD;  Location: [facility];  Service: Plastics;  Laterality: Bilateral;    Family History  Problem Relation Age of Onset   Cervical cancer Mother 42   Colon cancer Maternal Grandfather        mets to stomach; dx. 67-70   Heart disease Father    Prostate cancer Father 39   Cervical cancer Maternal Grandmother        dx. 78s; treated with radium implant   Lung cancer Sister 47       maternal half-sister dx. lung cancer, stage III   Breast cancer Maternal Aunt    Cervical cancer Sister 42       paternal half-sister; s/p TAH   Spina bifida Grandchild    Brain cancer Grandchild        grandson dx. at 20 mos, [facility] at [facility]   Breast cancer Other 51       maternal half-sister's daughter   Cancer Maternal Aunt        d. 68; unspecified type - "began at back area and moved to vital organs"   Cancer Maternal Uncle         late 32s; unspecified type; "started at back and moved to vital organs"    Social History   Socioeconomic History   Marital status: Widowed     Spouse name: Not on file   Number of children: 1   Years of education: 68   Highest education level: Not on file  Occupational History    Comment: [employer], retired  Tobacco Use   Smoking status: Every Day    Current packs/day: 0.25    Types: Cigarettes   Smokeless tobacco: Never  Vaping Use   Vaping status: Never Used  Substance and Sexual Activity   Alcohol use: Never   Drug use: No   Sexual activity: Not Currently  Other Topics Concern   Not on file  Social History Narrative   Lives alone, widow   Caffeine use- tea, 1 cup daily   Right Handed    Lives in a one story [place] home    Social Drivers of Corporate investment banker  Strain: Low Risk  (03/13/2023)   Overall Financial Resource Strain (CARDIA)    Difficulty of Paying Living Expenses: Not hard at all  Food Insecurity: No Food Insecurity (03/13/2023)   Hunger Vital Sign    Worried About Running Out of Food in the Last Year: Never true    Ran Out of Food in the Last Year: Never true  Transportation Needs: No Transportation Needs (03/13/2023)   PRAPARE - Administrator, Civil Service (Medical): No    Lack of Transportation (Non-Medical): No  Physical Activity: Inactive (03/13/2023)   Exercise Vital Sign    Days of Exercise per Week: 0 days    Minutes of Exercise per Session: 0 min  Stress: No Stress Concern Present (03/13/2023)   [employer] of Occupational Health - Occupational Stress Questionnaire    Feeling of Stress : Only a little  Social Connections: Socially Isolated (03/13/2023)   Social Connection and Isolation Panel [NHANES]    Frequency of Communication with Friends and Family: Twice a week    Frequency of Social Gatherings with Friends and Family: Twice a week    Attends Religious Services: Never    Database administrator or Organizations: No    Attends Banker Meetings: Never    Marital Status: Divorced  Catering manager Violence: Not At Risk (03/13/2023)    Humiliation, Afraid, Rape, and Kick questionnaire    Fear of Current or Ex-Partner: No    Emotionally Abused: No    Physically Abused: No    Sexually Abused: No    Outpatient Medications Prior to Visit  Medication Sig Dispense Refill   albuterol (PROVENTIL) (2.5 MG/3ML) 0.083% nebulizer solution Take 3 mLs (2.5 mg total) by nebulization 4 (four) times daily. 360 mL 1   albuterol (VENTOLIN HFA) 108 (90 Base) MCG/ACT inhaler INHALE 1 TO 2 PUFFS INTO LUNGS EVERY 6 HOURS AS NEEDED FOR  WHEEZING  AND/OR  SHORTNESS  OF  BREATH 18 g 5   amitriptyline (ELAVIL) 25 MG tablet Take 1 tablet (25 mg total) by mouth at bedtime. 30 tablet 3   Atogepant ([name]) 60 MG TABS Take 1 tablet (60 mg total) by mouth daily. 90 tablet 3   atorvastatin (LIPITOR) 10 MG tablet Take 1 tablet (10 mg total) by mouth daily. 90 tablet 1   Budeson-Glycopyrrol-Formoterol (BREZTRI AEROSPHERE) 160-9-4.8 MCG/ACT AERO Inhale 2 puffs into the lungs 2 (two) times daily. 3 each 3   cyanocobalamin (VITAMIN B12) 1000 MCG/ML injection Inject 1 mL (1,000 mcg total) every 30 (thirty) days as directed. 1 mL 6   donepezil (ARICEPT ODT) 10 MG disintegrating tablet Take 1 tablet (10 mg total) by mouth at bedtime. 30 tablet 3   famotidine (PEPCID) 20 MG tablet Take 1 tablet (20 mg total) by mouth 2 (two) times daily. 180 tablet 1   gabapentin (NEURONTIN) 300 MG capsule Take 1 capsule (300 mg total) by mouth 3 (three) times daily. 90 capsule 5   megestrol (MEGACE) 40 MG tablet Take 1 tablet (40 mg total) by mouth daily. 30 tablet 3   meloxicam (MOBIC) 15 MG tablet Take 1 tablet (15 mg total) by mouth daily. 30 tablet 3   memantine (NAMENDA) 10 MG tablet Take 1 tablet (10 mg total) by mouth 2 (two) times daily. 60 tablet 3   methocarbamol (ROBAXIN) 750 MG tablet Take 1 tablet (750 mg total) by mouth in the morning and at bedtime. 60 tablet 3   midodrine (PROAMATINE) 5 MG tablet Take  1 tablet (5 mg total) by mouth 3 (three) times daily with meals.  90 tablet 1   mirtazapine (REMERON) 45 MG tablet Take 1 tablet (45 mg total) by mouth at bedtime. 30 tablet 3   montelukast (SINGULAIR) 10 MG tablet Take 1 tablet (10 mg total) by mouth daily. 30 tablet 3   omeprazole (PRILOSEC) 40 MG capsule Take 1 capsule (40 mg total) by mouth 2 (two) times daily. 60 capsule 3   primidone (MYSOLINE) 50 MG tablet Take 1 tablet (50 mg total) by mouth at bedtime. 30 tablet 5   promethazine (PHENERGAN) 25 MG tablet Take 1 tablet (25 mg total) by mouth every 8 (eight) hours as needed for nausea or vomiting. 90 tablet 0   sertraline (ZOLOFT) 100 MG tablet Take 1 tablet (100 mg total) by mouth daily. 90 tablet 1   SYRINGE-NEEDLE, DISP, 3 ML (LUER LOCK SAFETY SYRINGES) 25G X 1" 3 ML MISC Inject 1 each into the muscle every 30 (thirty) days. 50 each 0   traZODone (DESYREL) 100 MG tablet Take 1 tablet (100 mg total) by mouth at bedtime. for sleep 30 tablet 5   Ubrogepant (UBRELVY) 100 MG TABS One tablet at onset of migraine, if needed a second dose may be taken at least 2 hours after the initial dose 10 tablet 2   Vitamin D, Ergocalciferol, (DRISDOL) 1.25 MG (50000 UNIT) CAPS capsule Take 1 capsule (50,000 Units total) by mouth every 7 (seven) days. 4 capsule 3   HYDROcodone-acetaminophen (NORCO/VICODIN) 5-325 MG tablet Take 1 tablet by mouth daily as needed for severe pain (migraines). 30 tablet 0   No facility-administered medications prior to visit.    Allergies  Allergen Reactions   Clindamycin/Lincomycin Nausea And Vomiting   Lyrica [Pregabalin] Other (See Comments)    PERSONALITY CHANGE    Review of Systems  Constitutional:  Negative for chills, fatigue and fever.  HENT:  Positive for congestion, sinus pressure and sinus pain. Negative for sore throat.        Sores in nose.  Eyes:  Positive for visual disturbance.  Respiratory:  Negative for cough.   Cardiovascular:  Negative for chest pain.  Gastrointestinal:  Positive for nausea. Negative for diarrhea  and vomiting.  Neurological:  Positive for headaches. Negative for dizziness.       Objective:        05/06/2023    2:21 PM 04/29/2023    3:26 PM 04/01/2023    8:43 AM  Vitals with BMI  Height 5' 1.25" 5' 1.25" 5' 1.25"  Weight 93 lbs 91 lbs 10 oz 85 lbs  BMI 17.42 17.16 15.92  Systolic 136 118 696  Diastolic 72 78 60  Pulse 74 97 89    No data found.   Physical Exam Vitals reviewed.  Constitutional:      Appearance: Normal appearance.  HENT:     Right Ear: Tympanic membrane, ear canal and external ear normal.     Left Ear: Tympanic membrane, ear canal and external ear normal.     Nose: Congestion present.     Comments: Maxillary sinus tenderness.    Mouth/Throat:     Pharynx: Oropharynx is clear. No posterior oropharyngeal erythema.  Cardiovascular:     Rate and Rhythm: Normal rate and regular rhythm.     Heart sounds: Normal heart sounds. No murmur heard. Pulmonary:     Effort: Pulmonary effort is normal. No respiratory distress.     Breath sounds: Normal breath sounds.  Lymphadenopathy:  Cervical: No cervical adenopathy.  Neurological:     Mental Status: She is alert and oriented to person, place, and time.  Psychiatric:        Mood and Affect: Mood normal.        Behavior: Behavior normal.     Health Maintenance Due  Topic Date Due   Hepatitis C Screening  Never done   Zoster Vaccines- Shingrix (2 of 2) 04/02/2022   Lung Cancer Screening  05/30/2022   COVID-19 Vaccine (5 - 2024-25 season) 05/08/2023    There are no preventive care reminders to display for this patient.   Lab Results  Component Value Date   TSH 3.770 04/01/2023   Lab Results  Component Value Date   WBC 11.9 (H) 04/01/2023   HGB 12.0 04/01/2023   HCT 38.1 04/01/2023   MCV 88 04/01/2023   PLT 229 04/01/2023   Lab Results  Component Value Date   NA 141 04/01/2023   K 3.3 (L) 04/01/2023   CO2 21 04/01/2023   GLUCOSE 84 04/01/2023   BUN 25 04/01/2023   CREATININE 1.04 (H)  04/01/2023   BILITOT <0.2 04/01/2023   ALKPHOS 78 04/01/2023   AST 19 04/01/2023   ALT 13 04/01/2023   PROT 5.9 (L) 04/01/2023   ALBUMIN 4.0 04/01/2023   CALCIUM 8.6 (L) 04/01/2023   ANIONGAP 3 (L) 07/03/2016   EGFR 56 (L) 04/01/2023   Lab Results  Component Value Date   CHOL 171 09/19/2022   Lab Results  Component Value Date   HDL 49 09/19/2022   Lab Results  Component Value Date   LDLCALC 104 (H) 09/19/2022   Lab Results  Component Value Date   TRIG 100 09/19/2022   Lab Results  Component Value Date   CHOLHDL 3.5 09/19/2022   Lab Results  Component Value Date   HGBA1C 5.9 (H) 04/01/2023       Assessment & Plan:  Thyroid nodule Assessment & Plan: Check thyroid US.   Orders: -     US THYROID; Future  Migraine aura without headache Assessment & Plan: Ubrelvy 100 mg given once.  Qlipta once daily. 14/0 samples. Awaiting PAP.  Orders: -     HYDROcodone-Acetaminophen; Take 1 tablet by mouth daily as needed for severe pain (migraines).  Dispense: 30 tablet; Refill: 0  Mass of upper lobe of right lung Assessment & Plan: Check PET scan   Orders: -     NM PET (AXUMIN) SKULL BASE TO MID THIGH; Future  Acute non-recurrent maxillary sinusitis Assessment & Plan: Start on zpack.   Orders: -     Azithromycin; 2 DAILY FOR FIRST DAY, THEN DECREASE TO ONE DAILY FOR 4 MORE DAYS.  Dispense: 6 each; Refill: 0     Meds ordered this encounter  Medications   HYDROcodone-acetaminophen (NORCO/VICODIN) 5-325 MG tablet    Sig: Take 1 tablet by mouth daily as needed for severe pain (migraines).    Dispense:  30 tablet    Refill:  0   azithromycin (ZITHROMAX Z-PAK) 250 MG tablet    Sig: 2 DAILY FOR FIRST DAY, THEN DECREASE TO ONE DAILY FOR 4 MORE DAYS.    Dispense:  6 each    Refill:  0    Orders Placed This Encounter  Procedures   US THYROID   NM PET (AXUMIN) SKULL BASE TO MID THIGH     Follow-up: Return in about 3 months (around 08/01/2023).  An After  Visit Summary was printed and given to the  patient.  Blane Ohara, MD [facility] [phone]

## 2023-05-07 ENCOUNTER — Telehealth: Payer: Self-pay

## 2023-05-07 NOTE — Progress Notes (Signed)
   05/07/2023  Patient ID: Anne [name], female   DOB: 02-22-1946, 78 y.o.   MRN: [id number]  Second outreach attempt to touch base with son regarding [name]'s medications unsuccessful.  Wanting to verify patient has medications she should continue to take and does not have any discontinued medications.  I was able to leave a voicemail with my direct number, so he can return my call.  If I do not hear back, I will try to call again later this week or early next week.  [name], PharmD, DPLA

## 2023-05-08 DIAGNOSIS — E43 Unspecified severe protein-calorie malnutrition: Secondary | ICD-10-CM | POA: Diagnosis not present

## 2023-05-08 DIAGNOSIS — F0393 Unspecified dementia, unspecified severity, with mood disturbance: Secondary | ICD-10-CM | POA: Diagnosis not present

## 2023-05-08 DIAGNOSIS — F03918 Unspecified dementia, unspecified severity, with other behavioral disturbance: Secondary | ICD-10-CM | POA: Diagnosis not present

## 2023-05-08 DIAGNOSIS — G4700 Insomnia, unspecified: Secondary | ICD-10-CM | POA: Diagnosis not present

## 2023-05-08 DIAGNOSIS — I700 Atherosclerosis of aorta: Secondary | ICD-10-CM | POA: Diagnosis not present

## 2023-05-08 DIAGNOSIS — J4489 Other specified chronic obstructive pulmonary disease: Secondary | ICD-10-CM | POA: Diagnosis not present

## 2023-05-08 DIAGNOSIS — F0394 Unspecified dementia, unspecified severity, with anxiety: Secondary | ICD-10-CM | POA: Diagnosis not present

## 2023-05-08 DIAGNOSIS — F3160 Bipolar disorder, current episode mixed, unspecified: Secondary | ICD-10-CM | POA: Diagnosis not present

## 2023-05-08 DIAGNOSIS — J9611 Chronic respiratory failure with hypoxia: Secondary | ICD-10-CM | POA: Diagnosis not present

## 2023-05-09 ENCOUNTER — Telehealth: Payer: Self-pay | Admitting: Family Medicine

## 2023-05-09 ENCOUNTER — Telehealth: Payer: Self-pay

## 2023-05-09 DIAGNOSIS — F0393 Unspecified dementia, unspecified severity, with mood disturbance: Secondary | ICD-10-CM | POA: Diagnosis not present

## 2023-05-09 DIAGNOSIS — J4489 Other specified chronic obstructive pulmonary disease: Secondary | ICD-10-CM | POA: Diagnosis not present

## 2023-05-09 DIAGNOSIS — E43 Unspecified severe protein-calorie malnutrition: Secondary | ICD-10-CM | POA: Diagnosis not present

## 2023-05-09 DIAGNOSIS — I951 Orthostatic hypotension: Secondary | ICD-10-CM

## 2023-05-09 DIAGNOSIS — F3160 Bipolar disorder, current episode mixed, unspecified: Secondary | ICD-10-CM | POA: Diagnosis not present

## 2023-05-09 DIAGNOSIS — G4700 Insomnia, unspecified: Secondary | ICD-10-CM | POA: Diagnosis not present

## 2023-05-09 DIAGNOSIS — F03918 Unspecified dementia, unspecified severity, with other behavioral disturbance: Secondary | ICD-10-CM | POA: Diagnosis not present

## 2023-05-09 DIAGNOSIS — D72829 Elevated white blood cell count, unspecified: Secondary | ICD-10-CM

## 2023-05-09 DIAGNOSIS — I700 Atherosclerosis of aorta: Secondary | ICD-10-CM | POA: Diagnosis not present

## 2023-05-09 DIAGNOSIS — G43109 Migraine with aura, not intractable, without status migrainosus: Secondary | ICD-10-CM

## 2023-05-09 DIAGNOSIS — F0394 Unspecified dementia, unspecified severity, with anxiety: Secondary | ICD-10-CM | POA: Diagnosis not present

## 2023-05-09 DIAGNOSIS — J9611 Chronic respiratory failure with hypoxia: Secondary | ICD-10-CM | POA: Diagnosis not present

## 2023-05-09 NOTE — Progress Notes (Signed)
   05/09/2023  Patient ID: Anne [name], female   DOB: January 09, 1946, 78 y.o.   MRN: [id number]  Third unsuccessful out reach attempt to contact patient's son to assist with medications.  Patient and son were recently seen by Dr. [name] and endorsed patient was in need of refills for some of her medications.  Patient also presented with medication bottles she should no longer be taking and continues to express confusion in regard to medication regimen.  I was able to leave another voicemail with my direct phone number for patient's son to return my call.  [name], PharmD, DPLA

## 2023-05-09 NOTE — Telephone Encounter (Signed)
 [facility] HEALTH HOME HEALTH - SUPPLEMENTAL ORDERS - 04/10/23 - 04/12/23  & [facility] HEALTH HOME HEALTH CERTIFICATION AND POC - 04/10/23 - 06/08/23

## 2023-05-10 DIAGNOSIS — J9611 Chronic respiratory failure with hypoxia: Secondary | ICD-10-CM | POA: Diagnosis not present

## 2023-05-10 DIAGNOSIS — G4700 Insomnia, unspecified: Secondary | ICD-10-CM | POA: Diagnosis not present

## 2023-05-10 DIAGNOSIS — F3160 Bipolar disorder, current episode mixed, unspecified: Secondary | ICD-10-CM | POA: Diagnosis not present

## 2023-05-10 DIAGNOSIS — E43 Unspecified severe protein-calorie malnutrition: Secondary | ICD-10-CM | POA: Diagnosis not present

## 2023-05-10 DIAGNOSIS — F03918 Unspecified dementia, unspecified severity, with other behavioral disturbance: Secondary | ICD-10-CM | POA: Diagnosis not present

## 2023-05-10 DIAGNOSIS — F0393 Unspecified dementia, unspecified severity, with mood disturbance: Secondary | ICD-10-CM | POA: Diagnosis not present

## 2023-05-10 DIAGNOSIS — F0394 Unspecified dementia, unspecified severity, with anxiety: Secondary | ICD-10-CM | POA: Diagnosis not present

## 2023-05-10 DIAGNOSIS — I700 Atherosclerosis of aorta: Secondary | ICD-10-CM | POA: Diagnosis not present

## 2023-05-10 DIAGNOSIS — J4489 Other specified chronic obstructive pulmonary disease: Secondary | ICD-10-CM | POA: Diagnosis not present

## 2023-05-10 NOTE — Telephone Encounter (Signed)
 Copied from CRM [id number]. Topic: General - Other >> May 09, 2023  4:11 PM Marlow Baars wrote: Reason for CRM: Joni Reining a nurse with [facility] called in to report she tried to visit the patient as well as call to no avail. She said this is not the first time with no response. She also tried to contact a family member but was unable to get a hold of them as well but did leave them a message. Joni Reining just wants the provider to know she did try to go out to her house today and waited around for like 20 minutes

## 2023-05-14 DIAGNOSIS — F3160 Bipolar disorder, current episode mixed, unspecified: Secondary | ICD-10-CM | POA: Diagnosis not present

## 2023-05-14 DIAGNOSIS — F0394 Unspecified dementia, unspecified severity, with anxiety: Secondary | ICD-10-CM | POA: Diagnosis not present

## 2023-05-14 DIAGNOSIS — J4489 Other specified chronic obstructive pulmonary disease: Secondary | ICD-10-CM | POA: Diagnosis not present

## 2023-05-14 DIAGNOSIS — I700 Atherosclerosis of aorta: Secondary | ICD-10-CM | POA: Diagnosis not present

## 2023-05-14 DIAGNOSIS — F03918 Unspecified dementia, unspecified severity, with other behavioral disturbance: Secondary | ICD-10-CM | POA: Diagnosis not present

## 2023-05-14 DIAGNOSIS — F0393 Unspecified dementia, unspecified severity, with mood disturbance: Secondary | ICD-10-CM | POA: Diagnosis not present

## 2023-05-14 DIAGNOSIS — E43 Unspecified severe protein-calorie malnutrition: Secondary | ICD-10-CM | POA: Diagnosis not present

## 2023-05-14 DIAGNOSIS — G4700 Insomnia, unspecified: Secondary | ICD-10-CM | POA: Diagnosis not present

## 2023-05-14 DIAGNOSIS — J9611 Chronic respiratory failure with hypoxia: Secondary | ICD-10-CM | POA: Diagnosis not present

## 2023-05-15 ENCOUNTER — Other Ambulatory Visit: Payer: Self-pay

## 2023-05-15 DIAGNOSIS — F03A3 Unspecified dementia, mild, with mood disturbance: Secondary | ICD-10-CM

## 2023-05-15 DIAGNOSIS — I951 Orthostatic hypotension: Secondary | ICD-10-CM

## 2023-05-15 DIAGNOSIS — J410 Simple chronic bronchitis: Secondary | ICD-10-CM

## 2023-05-15 DIAGNOSIS — E559 Vitamin D deficiency, unspecified: Secondary | ICD-10-CM

## 2023-05-15 DIAGNOSIS — E782 Mixed hyperlipidemia: Secondary | ICD-10-CM

## 2023-05-15 DIAGNOSIS — G43109 Migraine with aura, not intractable, without status migrainosus: Secondary | ICD-10-CM

## 2023-05-15 DIAGNOSIS — G479 Sleep disorder, unspecified: Secondary | ICD-10-CM

## 2023-05-16 ENCOUNTER — Telehealth: Payer: Self-pay

## 2023-05-16 NOTE — Telephone Encounter (Signed)
 Copied from CRM [id number]. Topic: General - Other >> May 16, 2023  3:21 PM Gery Pray wrote: Reason for CRM: Sybil from [facility] called stating that patient was scheduled to have P/T today 03/13, however, when she arrived for the appt patient would not answer the door nor her phone calls. Sybil could hear the TV and the patient walking around. Gillis Ends believes that the patient may be frustrated about not being able to get around and drive and may not want to continue P/T. There is another P/T or Sybil that will attempt to go out again to the patient's home for P/T next week.

## 2023-05-21 ENCOUNTER — Other Ambulatory Visit: Payer: Self-pay

## 2023-05-21 ENCOUNTER — Other Ambulatory Visit (HOSPITAL_COMMUNITY): Payer: Self-pay

## 2023-05-22 ENCOUNTER — Other Ambulatory Visit: Payer: Self-pay

## 2023-05-22 DIAGNOSIS — F0393 Unspecified dementia, unspecified severity, with mood disturbance: Secondary | ICD-10-CM | POA: Diagnosis not present

## 2023-05-22 DIAGNOSIS — F0394 Unspecified dementia, unspecified severity, with anxiety: Secondary | ICD-10-CM | POA: Diagnosis not present

## 2023-05-22 DIAGNOSIS — G4700 Insomnia, unspecified: Secondary | ICD-10-CM | POA: Diagnosis not present

## 2023-05-22 DIAGNOSIS — F3160 Bipolar disorder, current episode mixed, unspecified: Secondary | ICD-10-CM | POA: Diagnosis not present

## 2023-05-22 DIAGNOSIS — F03918 Unspecified dementia, unspecified severity, with other behavioral disturbance: Secondary | ICD-10-CM | POA: Diagnosis not present

## 2023-05-22 DIAGNOSIS — J9611 Chronic respiratory failure with hypoxia: Secondary | ICD-10-CM | POA: Diagnosis not present

## 2023-05-22 DIAGNOSIS — E43 Unspecified severe protein-calorie malnutrition: Secondary | ICD-10-CM | POA: Diagnosis not present

## 2023-05-22 DIAGNOSIS — I700 Atherosclerosis of aorta: Secondary | ICD-10-CM | POA: Diagnosis not present

## 2023-05-22 DIAGNOSIS — J4489 Other specified chronic obstructive pulmonary disease: Secondary | ICD-10-CM | POA: Diagnosis not present

## 2023-05-23 ENCOUNTER — Other Ambulatory Visit (HOSPITAL_COMMUNITY): Payer: Self-pay

## 2023-05-23 ENCOUNTER — Other Ambulatory Visit: Payer: Self-pay

## 2023-05-23 DIAGNOSIS — G479 Sleep disorder, unspecified: Secondary | ICD-10-CM

## 2023-05-23 DIAGNOSIS — F03918 Unspecified dementia, unspecified severity, with other behavioral disturbance: Secondary | ICD-10-CM | POA: Diagnosis not present

## 2023-05-23 DIAGNOSIS — F332 Major depressive disorder, recurrent severe without psychotic features: Secondary | ICD-10-CM

## 2023-05-23 DIAGNOSIS — J4489 Other specified chronic obstructive pulmonary disease: Secondary | ICD-10-CM | POA: Diagnosis not present

## 2023-05-23 DIAGNOSIS — I951 Orthostatic hypotension: Secondary | ICD-10-CM

## 2023-05-23 DIAGNOSIS — E43 Unspecified severe protein-calorie malnutrition: Secondary | ICD-10-CM

## 2023-05-23 DIAGNOSIS — I700 Atherosclerosis of aorta: Secondary | ICD-10-CM | POA: Diagnosis not present

## 2023-05-23 DIAGNOSIS — G4700 Insomnia, unspecified: Secondary | ICD-10-CM | POA: Diagnosis not present

## 2023-05-23 DIAGNOSIS — J9611 Chronic respiratory failure with hypoxia: Secondary | ICD-10-CM | POA: Diagnosis not present

## 2023-05-23 DIAGNOSIS — E559 Vitamin D deficiency, unspecified: Secondary | ICD-10-CM

## 2023-05-23 DIAGNOSIS — F0394 Unspecified dementia, unspecified severity, with anxiety: Secondary | ICD-10-CM | POA: Diagnosis not present

## 2023-05-23 DIAGNOSIS — F3160 Bipolar disorder, current episode mixed, unspecified: Secondary | ICD-10-CM | POA: Diagnosis not present

## 2023-05-23 DIAGNOSIS — F0393 Unspecified dementia, unspecified severity, with mood disturbance: Secondary | ICD-10-CM | POA: Diagnosis not present

## 2023-05-23 DIAGNOSIS — R251 Tremor, unspecified: Secondary | ICD-10-CM

## 2023-05-23 DIAGNOSIS — J410 Simple chronic bronchitis: Secondary | ICD-10-CM

## 2023-05-23 DIAGNOSIS — J418 Mixed simple and mucopurulent chronic bronchitis: Secondary | ICD-10-CM

## 2023-05-23 DIAGNOSIS — E782 Mixed hyperlipidemia: Secondary | ICD-10-CM

## 2023-05-23 DIAGNOSIS — F03A3 Unspecified dementia, mild, with mood disturbance: Secondary | ICD-10-CM

## 2023-05-23 MED ORDER — SERTRALINE HCL 100 MG PO TABS: 100.0000 mg | ORAL_TABLET | Freq: Every day | ORAL | 1 refills | Status: AC

## 2023-05-23 MED ORDER — PRIMIDONE 50 MG PO TABS: 50.0000 mg | ORAL_TABLET | Freq: Every day | ORAL | 1 refills | Status: AC

## 2023-05-23 MED ORDER — MEGESTROL ACETATE 40 MG PO TABS: 40.0000 mg | ORAL_TABLET | Freq: Every day | ORAL | 1 refills | Status: AC

## 2023-05-23 MED ORDER — MONTELUKAST SODIUM 10 MG PO TABS: 10.0000 mg | ORAL_TABLET | Freq: Every day | ORAL | 1 refills | Status: AC

## 2023-05-23 MED ORDER — PROMETHAZINE HCL 25 MG PO TABS: 25.0000 mg | ORAL_TABLET | Freq: Three times a day (TID) | ORAL | 1 refills | Status: AC | PRN

## 2023-05-23 MED ORDER — MEMANTINE HCL 10 MG PO TABS: 10.0000 mg | ORAL_TABLET | Freq: Two times a day (BID) | ORAL | 1 refills | Status: AC

## 2023-05-23 MED ORDER — VITAMIN D (ERGOCALCIFEROL) 1.25 MG (50000 UNIT) PO CAPS: 50000.0000 [IU] | ORAL_CAPSULE | ORAL | 3 refills | Status: AC

## 2023-05-23 MED ORDER — TRAZODONE HCL 100 MG PO TABS
100.0000 mg | ORAL_TABLET | Freq: Every day | ORAL | 1 refills | Status: AC
Start: 2023-05-23 — End: ?

## 2023-05-23 MED ORDER — OMEPRAZOLE 40 MG PO CPDR: 40.0000 mg | DELAYED_RELEASE_CAPSULE | Freq: Two times a day (BID) | ORAL | 1 refills | Status: AC

## 2023-05-23 MED ORDER — GABAPENTIN 300 MG PO CAPS: 300.0000 mg | ORAL_CAPSULE | Freq: Three times a day (TID) | ORAL | 1 refills | Status: AC

## 2023-05-23 MED ORDER — METHOCARBAMOL 750 MG PO TABS: 750.0000 mg | ORAL_TABLET | Freq: Two times a day (BID) | ORAL | 1 refills | Status: AC

## 2023-05-23 MED ORDER — CYANOCOBALAMIN 1000 MCG/ML IJ SOLN: 1000.0000 ug | INTRAMUSCULAR | 2 refills | Status: AC

## 2023-05-23 MED ORDER — MIRTAZAPINE 45 MG PO TABS: 45.0000 mg | ORAL_TABLET | Freq: Every day | ORAL | 1 refills | Status: AC

## 2023-05-23 MED ORDER — ALBUTEROL SULFATE HFA 108 (90 BASE) MCG/ACT IN AERS: 1.0000 | INHALATION_SPRAY | Freq: Four times a day (QID) | RESPIRATORY_TRACT | 1 refills | Status: AC | PRN

## 2023-05-23 MED ORDER — MELOXICAM 15 MG PO TABS: 15.0000 mg | ORAL_TABLET | Freq: Every day | ORAL | 1 refills | Status: AC

## 2023-05-23 MED ORDER — FAMOTIDINE 20 MG PO TABS: 20.0000 mg | ORAL_TABLET | Freq: Two times a day (BID) | ORAL | 1 refills | Status: AC

## 2023-05-23 MED ORDER — DONEPEZIL HCL 10 MG PO TBDP
10.0000 mg | ORAL_TABLET | Freq: Every day | ORAL | 1 refills | Status: AC
Start: 2023-05-23 — End: ?

## 2023-05-23 MED ORDER — AMITRIPTYLINE HCL 25 MG PO TABS: 25.0000 mg | ORAL_TABLET | Freq: Every day | ORAL | 1 refills | Status: AC

## 2023-05-23 MED ORDER — ATORVASTATIN CALCIUM 10 MG PO TABS
10.0000 mg | ORAL_TABLET | Freq: Every day | ORAL | 1 refills | Status: AC
Start: 2023-05-23 — End: ?

## 2023-05-23 MED ORDER — MIDODRINE HCL 5 MG PO TABS
5.0000 mg | ORAL_TABLET | Freq: Three times a day (TID) | ORAL | 1 refills | Status: AC
Start: 2023-05-23 — End: ?

## 2023-05-23 MED ORDER — BREZTRI AEROSPHERE 160-9-4.8 MCG/ACT IN AERO: 2.0000 | INHALATION_SPRAY | Freq: Two times a day (BID) | RESPIRATORY_TRACT | 1 refills | Status: AC

## 2023-05-24 ENCOUNTER — Telehealth: Payer: Self-pay | Admitting: Family Medicine

## 2023-05-24 ENCOUNTER — Telehealth: Payer: Self-pay

## 2023-05-24 NOTE — Telephone Encounter (Signed)
 [name] 60 mg has been approved by insurance from 10/24-12/25.

## 2023-05-24 NOTE — Telephone Encounter (Signed)
 Supplemental Orders from 05/14/23 to 05/14/23.

## 2023-05-27 ENCOUNTER — Other Ambulatory Visit: Payer: Self-pay

## 2023-05-27 ENCOUNTER — Other Ambulatory Visit: Payer: Self-pay | Admitting: *Deleted

## 2023-05-27 ENCOUNTER — Telehealth: Payer: Self-pay | Admitting: *Deleted

## 2023-05-27 NOTE — Patient Outreach (Signed)
  Care Management   Follow Up Note   05/27/2023 Name: Anne [name] MRN: [id number] DOB: [birth year]   Referred by: Blane Ohara, MD Reason for referral : Care Management (RNCM: ATTEMPT #3 Initial Outreach For Chronic Disease Management & Care Coordination Needs )   Third unsuccessful telephone outreach was attempted today. The patient was referred to the case management team for assistance with care management and care coordination. The patient's primary care provider has been notified of our unsuccessful attempts to make or maintain contact with the patient. The care management team is pleased to engage with this patient at any time in the future should he/she be interested in assistance from the care management team.   Follow Up Plan: No further follow up required:    Larey Brick, BSN RN [facility], [facility] RN Care Manager Direct Dial: [phone]  Fax: [phone]

## 2023-05-28 ENCOUNTER — Other Ambulatory Visit: Payer: Self-pay

## 2023-05-28 DIAGNOSIS — J4489 Other specified chronic obstructive pulmonary disease: Secondary | ICD-10-CM | POA: Diagnosis not present

## 2023-05-28 DIAGNOSIS — F0394 Unspecified dementia, unspecified severity, with anxiety: Secondary | ICD-10-CM | POA: Diagnosis not present

## 2023-05-28 DIAGNOSIS — F0393 Unspecified dementia, unspecified severity, with mood disturbance: Secondary | ICD-10-CM | POA: Diagnosis not present

## 2023-05-28 DIAGNOSIS — F3160 Bipolar disorder, current episode mixed, unspecified: Secondary | ICD-10-CM | POA: Diagnosis not present

## 2023-05-28 DIAGNOSIS — F03918 Unspecified dementia, unspecified severity, with other behavioral disturbance: Secondary | ICD-10-CM | POA: Diagnosis not present

## 2023-05-28 DIAGNOSIS — I700 Atherosclerosis of aorta: Secondary | ICD-10-CM | POA: Diagnosis not present

## 2023-05-28 DIAGNOSIS — G4700 Insomnia, unspecified: Secondary | ICD-10-CM | POA: Diagnosis not present

## 2023-05-28 DIAGNOSIS — E43 Unspecified severe protein-calorie malnutrition: Secondary | ICD-10-CM | POA: Diagnosis not present

## 2023-05-28 DIAGNOSIS — J9611 Chronic respiratory failure with hypoxia: Secondary | ICD-10-CM | POA: Diagnosis not present

## 2023-05-30 DIAGNOSIS — F0394 Unspecified dementia, unspecified severity, with anxiety: Secondary | ICD-10-CM | POA: Diagnosis not present

## 2023-05-30 DIAGNOSIS — E43 Unspecified severe protein-calorie malnutrition: Secondary | ICD-10-CM | POA: Diagnosis not present

## 2023-05-30 DIAGNOSIS — J4489 Other specified chronic obstructive pulmonary disease: Secondary | ICD-10-CM | POA: Diagnosis not present

## 2023-05-30 DIAGNOSIS — F03918 Unspecified dementia, unspecified severity, with other behavioral disturbance: Secondary | ICD-10-CM | POA: Diagnosis not present

## 2023-05-30 DIAGNOSIS — J9611 Chronic respiratory failure with hypoxia: Secondary | ICD-10-CM | POA: Diagnosis not present

## 2023-05-30 DIAGNOSIS — F3160 Bipolar disorder, current episode mixed, unspecified: Secondary | ICD-10-CM | POA: Diagnosis not present

## 2023-05-30 DIAGNOSIS — I700 Atherosclerosis of aorta: Secondary | ICD-10-CM | POA: Diagnosis not present

## 2023-05-30 DIAGNOSIS — G4700 Insomnia, unspecified: Secondary | ICD-10-CM | POA: Diagnosis not present

## 2023-05-30 DIAGNOSIS — F0393 Unspecified dementia, unspecified severity, with mood disturbance: Secondary | ICD-10-CM | POA: Diagnosis not present

## 2023-05-31 ENCOUNTER — Encounter: Payer: Self-pay | Admitting: *Deleted

## 2023-05-31 ENCOUNTER — Other Ambulatory Visit: Payer: Self-pay | Admitting: *Deleted

## 2023-05-31 NOTE — Patient Outreach (Signed)
 Care Management   Visit Note  05/31/2023 Name: Anne [name] MRN: [id number] DOB: 04/28/1945  Subjective: Anne [name] is a 78 y.o. year old female who is a primary care patient of Cox, Kirsten, MD. The Care Management team was consulted for assistance.      Engaged with patient spoke with patient by telephone.    Goals Addressed             This Visit's Progress    RNCM Care Management Expected Outcomes: Monitor, Self-Manage and Reduce Symptoms of: COPD, Dementia       Current Barriers:  Knowledge Deficits related to plan of care for management of COPD and Dementia  Chronic Disease Management support and education needs related to COPD and Dementia  Cognitive Deficits  RNCM Clinical Goal(s):  Patient will verbalize basic understanding of  COPD and Dementia disease process and self health management plan as evidenced by verbal explanation, recognizing symptoms, lifestyle modifications take all medications exactly as prescribed and will call provider for medication related questions as evidenced by compliance with all medications attend all scheduled medical appointments: with primary care provider and specialist as evidenced by keeping all scheduled appointments demonstrate Improved and Ongoing adherence to prescribed treatment plan for COPD and Dementia as evidenced by consistent medication compliance, symptom monitoring, continued lifestyle changes continue to work with RN Care Manager to address care management and care coordination needs related to  COPD and Dementia as evidenced by adherence to CM Team Scheduled appointments through collaboration with RN Care manager, provider, and care team.   Interventions: Evaluation of current treatment plan related to  self management and patient's adherence to plan as established by provider   COPD Interventions:  (Status:  New goal.) Long Term Goal Provided patient with basic written and verbal COPD education on self  care/management/and exacerbation prevention Advised patient to track and manage COPD triggers Provided written and verbal instructions on pursed lip breathing and utilized returned demonstration as teach back Provided instruction about proper use of medications used for management of COPD including inhalers Advised patient to self assesses COPD action plan zone and make appointment with provider if in the yellow zone for 48 hours without improvement Advised patient to engage in light exercise as tolerated 3-5 days a week to aid in the the management of COPD Provided education about and advised patient to utilize infection prevention strategies to reduce risk of respiratory infection Discussed the importance of adequate rest and management of fatigue with COPD Screening for signs and symptoms of depression related to chronic disease state  Assessed social determinant of health barriers   Dementia:  (Status:  New goal.)  Long Term Goal Evaluation of current treatment plan related to dementia. Patient states that she does not believe that she has dementia. She reports that PCP revoked her driving privileges and she is trying to cope with being at home.  Depression screen completed Emotional Support Provided to patient/caregiver Sleep hygiene recommendations and education provided,  Discussed importance of discussing diagnosis with provider Discussed importance of attendance to all provider appointments. PCP 08-01-2023 Advised to contact provider for new or worsening symptoms  Patient Goals/Self-Care Activities: Take all medications as prescribed Attend all scheduled provider appointments Call pharmacy for medication refills 3-7 days in advance of running out of medications Attend church or other social activities Perform all self care activities independently  Perform IADL's (shopping, preparing meals, housekeeping, managing finances) independently Call provider office for new concerns or  questions  eliminate smoking  in my home identify and remove indoor air pollutants listen for public air quality announcements every day do breathing exercises every day  Follow Up Plan:  Telephone follow up appointment with care management team member scheduled for:  06-12-2023 at 3:15 pm           Consent to Services:  Patient was given information about care management services, agreed to services, and gave verbal consent to participate.   Plan: Telephone follow up appointment with care management team member scheduled for:06-12-2023 at 3:15 pm  Danise Edge, BSN RN [facility], [facility] RN Care Manager Direct Dial: [phone]  Fax: [phone]

## 2023-05-31 NOTE — Patient Instructions (Addendum)
 Care Management   Initial Visit Note  05/31/2023 Name: Anne [name] MRN: [id number] DOB: 1945/07/10  Anne [name] is enrolled in a Managed Medicaid plan: No. Outreach attempt today was successful.   Subjective:   Objective:  Assessment: Anne [name] is a 78 y.o. year old female who sees [name], Kirsten, MD for primary care. The care management team was consulted for assistance with care management and care coordination needs related to Disease Management and Dementia and Caregiver Support.   Review of patient status, including review of consultants reports, relevant laboratory and other test results, and collaboration with appropriate care team members and the patient's provider was performed as part of comprehensive patient evaluation and provision of care management services.    SDOH (Social Determinants of Health) screening performed today. See Care Plan Entry related to challenges with: Social Connections, Tobacco Use, and Physical Activity   Goals Addressed             This Visit's Progress    RNCM Care Management Expected Outcomes: Monitor, Self-Manage and Reduce Symptoms of: COPD, Dementia       Current Barriers:  Knowledge Deficits related to plan of care for management of COPD and Dementia  Chronic Disease Management support and education needs related to COPD and Dementia  Cognitive Deficits  RNCM Clinical Goal(s):  Patient will verbalize basic understanding of  COPD and Dementia disease process and self health management plan as evidenced by verbal explanation, recognizing symptoms, lifestyle modifications take all medications exactly as prescribed and will call provider for medication related questions as evidenced by compliance with all medications attend all scheduled medical appointments: with primary care provider and specialist as evidenced by keeping all scheduled appointments demonstrate Improved and Ongoing adherence to prescribed treatment plan for COPD  and Dementia as evidenced by consistent medication compliance, symptom monitoring, continued lifestyle changes continue to work with RN Care Manager to address care management and care coordination needs related to  COPD and Dementia as evidenced by adherence to CM Team Scheduled appointments through collaboration with RN Care manager, provider, and care team.   Interventions: Evaluation of current treatment plan related to  self management and patient's adherence to plan as established by provider   COPD Interventions:  (Status:  New goal.) Long Term Goal Provided patient with basic written and verbal COPD education on self care/management/and exacerbation prevention Advised patient to track and manage COPD triggers Provided written and verbal instructions on pursed lip breathing and utilized returned demonstration as teach back Provided instruction about proper use of medications used for management of COPD including inhalers Advised patient to self assesses COPD action plan zone and make appointment with provider if in the yellow zone for 48 hours without improvement Advised patient to engage in light exercise as tolerated 3-5 days a week to aid in the the management of COPD Provided education about and advised patient to utilize infection prevention strategies to reduce risk of respiratory infection Discussed the importance of adequate rest and management of fatigue with COPD Screening for signs and symptoms of depression related to chronic disease state  Assessed social determinant of health barriers   Dementia:  (Status:  New goal.)  Long Term Goal Evaluation of current treatment plan related to dementia. Patient states that she does not believe that she has dementia. She reports that PCP revoked her driving privileges and she is trying to cope with being at home.  Depression screen completed Emotional Support Provided to patient/caregiver Sleep hygiene recommendations  and education  provided,  Discussed importance of discussing diagnosis with provider Discussed importance of attendance to all provider appointments. PCP 08-01-2023 Advised to contact provider for new or worsening symptoms  Patient Goals/Self-Care Activities: Take all medications as prescribed Attend all scheduled provider appointments Call pharmacy for medication refills 3-7 days in advance of running out of medications Attend church or other social activities Perform all self care activities independently  Perform IADL's (shopping, preparing meals, housekeeping, managing finances) independently Call provider office for new concerns or questions  eliminate smoking in my home identify and remove indoor air pollutants listen for public air quality announcements every day do breathing exercises every day  Follow Up Plan:  Telephone follow up appointment with care management team member scheduled for:  06-12-2023 at 3:15 pm           Follow up plan:  Telephone follow up appointment with care management team member scheduled for:07-02-2023 at 3:15 pm  Anne [name] was given information about Care Management services today including:  Care Management services include personalized support from designated clinical staff supervised by a physician, including individualized plan of care and coordination with other care providers 24/7 contact phone numbers for assistance for urgent and routine care needs. The patient may stop CCM services at any time (effective at the end of the month) by phone call to the office staff.  Patient agreed to services and verbal consent obtained. Danise Edge, BSN RN [facility], [facility] RN Care Manager Direct Dial: [phone]  Fax: [phone]   Chronic Obstructive Pulmonary Disease  Chronic obstructive pulmonary disease (COPD) is a long-term (chronic) lung problem. When you have COPD, it can feel harder to breathe in or out. The  condition may get worse over time. There are things you can do to keep yourself as healthy as possible. What are the causes? Smoking. This is the most common cause. Breathing in fumes, smoke, or chemicals for a long time. Genes that are inherited, which means they are passed down from parent to child. What are the signs or symptoms? Shortness of breath. This may happen all the time. This may get worse when you move your body. This may get worse over time. You may have times when this becomes much worse all of a sudden. These are called flare-ups or exacerbations. A long-term cough, with or without thick mucus. Wheezing. Chest tightness. Feeling tired. Not being able to do activities like you used to do. How is this diagnosed? This condition is diagnosed based on: Your medical history. A physical exam. Lung (pulmonary) function tests. You may have a test that measures the air flow out of the lungs when you breathe out. You may also have tests, including: Chest X-ray. CT scan. Blood tests. How is this treated? This condition may be treated by: Quitting smoking, if you smoke. Using oxygen. Taking medicines. These may include: Inhalers. These have medicines in them that you breathe in. Daily inhalers. These help to prevent symptoms from happening. They are usually taken every day to prevent COPD flare-ups. Quick relief inhalers. These act fast to relieve symptoms. They are used only when needed and provide short-term relief. Other medicines that you breathe in or swallow. These may be used to open the airways, thin mucus, or treat infections. Breathing exercises to help you control or catch your breath. A mucus clearing device, if you have a lot of thick mucus. Pulmonary rehab. A place where you will learn about your condition  and the best ways for you to manage it. Surgery. Follow these instructions at home: Medicines Take your medicines as told by your health care  provider. Talk to your provider before taking any cough or allergy medicines. You may need to avoid medicines that cause your lungs to be dry. Lifestyle Several times a day, wash your hands with soap and water for at least 20 seconds. If you cannot use soap and water, use hand sanitizer. This may help keep you from getting an infection. Avoid being around crowds or people who are sick. Do not smoke or use any products that contain nicotine or tobacco. If you need help quitting, ask your provider. Stay active. Learn how to pace your activity during the day. Learn how to breathe to control your stress and catch your breath. Drink enough fluid to keep your pee (urine) pale yellow, unless you have been told not to. Eat healthy foods. Eat smaller meals more often. Get enough sleep. Most adults need 7 or more hours per night. General instructions Make a COPD action plan with your provider. This helps you to know what to do if you feel worse than usual. Make sure you get all the shots, also called vaccines, that your provider recommends. Ask your provider about a flu shot and a pneumonia shot. If you need home oxygen therapy, ask your provider how often to check your oxygen level with a device called an oximeter. Keep all follow-up visits to review your COPD action plan. Your provider will want to check on your condition often to keep you healthy and out of the hospital. Contact a health care provider if: You are coughing up more mucus than usual. There is a change in the color or thickness of the mucus. It is harder to breathe than usual or you are short of breath while you are resting. You need to use your quick relief inhaler more often. You have trouble doing your normal activities such as getting dressed or walking in the house. Your skin color or fingernails turn blue. You have a fever or chills. Get help right away if: You are short of breath and cannot: Talk in full sentences. Do normal  activities. You have chest pain. You feel confused. These symptoms may be an emergency. Call 911 right away. Do not wait to see if the symptoms will go away. Do not drive yourself to the hospital. This information is not intended to replace advice given to you by your health care provider. Make sure you discuss any questions you have with your health care provider. Document Revised: 11/22/2022 Document Reviewed: 05/07/2022 Elsevier Patient Education  2024 Elsevier Inc.  Dementia Dementia is a condition that affects the way the brain works. It often affects thinking and memory.  There are many types of dementia, including: Alzheimer's disease. This is the most common type. Vascular dementia. This type may happen due to a stroke. Lewy body dementia. This type may happen to people who have Parkinson's disease. Frontotemporal dementia. This type is caused by damage to nerve cells in certain parts of the brain. Some people may have more than one type. What are the causes? Dementia is caused by damage to cells in the brain. Some causes that can't be reversed include: Having a condition that affects the blood vessels of the brain. This may be diabetes or heart disease. Changes to genes. Some causes that can be reversed or slowed down include: Injury to the brain due to: A growth called a  tumor. A blood clot. Too much fluid in the brain. Taking certain medicines. An infection. Problems with your thyroid. Not having enough vitamin B12 in the body. Having a disease that causes your body's defense system, called the immune system, to attack healthy parts of your body. What are the signs or symptoms? Symptoms of dementia start slowly and get worse with time. They may include: Problems remembering events or people. Getting lost easily. Forgetting appointments or to pay bills. Having trouble taking a bath or putting clothes on. Having trouble planning and making meals. Having trouble  speaking. Changes in behavior or mood. How is this diagnosed? Dementia may be diagnosed based on: Your symptoms and medical history. A physical exam. Tests. These may include: Tests to check your thinking and memory to see how your brain is working. Lab tests. You may have tests on your blood or pee (urine). Imaging tests, such as a CT scan, a PET scan, or an MRI. Genetic testing. This may be done if other family members have had dementia. Your health care provider will talk with you and your family, friends, or caregivers about your history and symptoms. How is this treated? Treatment depends on the cause of the dementia and should start as soon as possible. It might include: Taking medicines for symptoms. Taking medicines to help control or slow down the dementia. Treating the cause of your dementia. Your provider can help you find support groups and other members of the health care team who can help with your care. Follow these instructions at home: Medicines Take medicines only as told by your provider. Use a pill organizer or pill reminder to help you keep track of your medicines. Avoid taking medicines for pain or for sleep. These can affect your thinking. Lifestyle Make healthy choices. Be active as told by your provider. Do not smoke, vape, or use products with nicotine or tobacco in them. If you need help quitting, talk with your provider. Do not drink alcohol. When you feel a lot of stress, do something that helps you relax. Your provider can give you tips. Spend time with other people. Make sure you get good sleep at night. These tips can help: Try not to take naps during the day. Keep your bedroom dark and cool. Do not exercise in the few hours before you go to bed. Do not have foods or drinks with caffeine at night. Eating and drinking Drink enough fluid to keep your pee pale yellow. Eat a healthy diet. General instructions  Talk with your provider to decide  on: What things you need help with. What your safety needs are. Ask your provider if it's safe for you to drive. If told, wear a bracelet that tracks where you are or shows that you're a person with memory loss. Work with your family to make big legal or health decisions. This may include things like advance directives, medical power of attorney, or a living will. Where to find more information Alzheimer's Association: WesternTunes.it [employer] on Aging: BaseRingTones.pl World Health Organization: VisitDestination.com.br Contact a health care provider if: You have any new symptoms. Your symptoms get worse. You have problems with swallowing. Get help right away if: You feel very sad or feel like you may hurt yourself or others. You have thoughts about taking your own life. Your family members are worried about your safety. These symptoms may be an emergency. Take one of these steps right away: Go to your nearest emergency room. Call 911. Call the [facility]  Suicide Prevention Lifeline at 605-099-5406 or 35. Text the Crisis Text Line at 250-886-0817. This information is not intended to replace advice given to you by your health care provider. Make sure you discuss any questions you have with your health care provider. Document Revised: 12/20/2022 Document Reviewed: 05/07/2022 Elsevier Patient Education  2024 [employer].

## 2023-06-03 ENCOUNTER — Ambulatory Visit (INDEPENDENT_AMBULATORY_CARE_PROVIDER_SITE_OTHER)
Admission: RE | Admit: 2023-06-03 | Discharge: 2023-06-03 | Disposition: A | Discharge status: Home / Self Care | Source: Ambulatory Visit | Attending: Family Medicine | Admitting: Family Medicine

## 2023-06-03 DIAGNOSIS — F0393 Unspecified dementia, unspecified severity, with mood disturbance: Secondary | ICD-10-CM | POA: Diagnosis not present

## 2023-06-03 DIAGNOSIS — E049 Nontoxic goiter, unspecified: Secondary | ICD-10-CM

## 2023-06-03 DIAGNOSIS — G4700 Insomnia, unspecified: Secondary | ICD-10-CM | POA: Diagnosis not present

## 2023-06-03 DIAGNOSIS — E041 Nontoxic single thyroid nodule: Secondary | ICD-10-CM

## 2023-06-03 DIAGNOSIS — R918 Other nonspecific abnormal finding of lung field: Secondary | ICD-10-CM | POA: Diagnosis not present

## 2023-06-03 DIAGNOSIS — F0394 Unspecified dementia, unspecified severity, with anxiety: Secondary | ICD-10-CM | POA: Diagnosis not present

## 2023-06-03 DIAGNOSIS — F03918 Unspecified dementia, unspecified severity, with other behavioral disturbance: Secondary | ICD-10-CM | POA: Diagnosis not present

## 2023-06-03 DIAGNOSIS — F3160 Bipolar disorder, current episode mixed, unspecified: Secondary | ICD-10-CM | POA: Diagnosis not present

## 2023-06-03 DIAGNOSIS — J9611 Chronic respiratory failure with hypoxia: Secondary | ICD-10-CM | POA: Diagnosis not present

## 2023-06-03 DIAGNOSIS — E43 Unspecified severe protein-calorie malnutrition: Secondary | ICD-10-CM | POA: Diagnosis not present

## 2023-06-03 DIAGNOSIS — I700 Atherosclerosis of aorta: Secondary | ICD-10-CM | POA: Diagnosis not present

## 2023-06-03 DIAGNOSIS — J4489 Other specified chronic obstructive pulmonary disease: Secondary | ICD-10-CM | POA: Diagnosis not present

## 2023-06-03 DIAGNOSIS — E042 Nontoxic multinodular goiter: Secondary | ICD-10-CM | POA: Diagnosis not present

## 2023-06-03 MED ORDER — FLUDEOXYGLUCOSE F - 18 (FDG) INJECTION: 10.0000 | Freq: Once | INTRAVENOUS | Status: DC | PRN

## 2023-06-10 ENCOUNTER — Telehealth: Payer: Self-pay

## 2023-06-10 NOTE — Telephone Encounter (Signed)
 Copied from CRM [id number]. Topic: Clinical - Medical Advice >> Jun 10, 2023  9:18 AM Alessandra Bevels wrote: Reason for CRM: Anne [name] is calling to report that the patient has been accepted into [facility].  The patient does not want to participate in the program because she would loose Dr. [name] as a medical DR. Patient stated over my dead body. Can Dr. [name] help facilitate the patient to go into the [facility] program- 2 days a week?

## 2023-06-11 ENCOUNTER — Encounter: Payer: Self-pay | Admitting: Family Medicine

## 2023-06-12 ENCOUNTER — Other Ambulatory Visit: Payer: Self-pay

## 2023-06-12 DIAGNOSIS — F03918 Unspecified dementia, unspecified severity, with other behavioral disturbance: Secondary | ICD-10-CM

## 2023-06-12 NOTE — Patient Instructions (Signed)
 Visit Information  Thank you for taking time to visit with me today. Please don't hesitate to contact me if I can be of assistance to you before our next scheduled telephone appointment.  Our next appointment is by telephone on 06/26/23 at 3:00  Following is a copy of your care plan:   Goals Addressed             This Visit's Progress    [facility] RN Care Plan       Problems:  Chronic Disease Management support and education needs related to Dementia and Depression  Goal: Over the next 2 weeks the Patient will attend all scheduled medical appointments:   as evidenced by chart review and patient report        continue to work with RN Care Manager and/or Social Worker to address care management and care coordination needs related to Dementia and Depression as evidenced by adherence to CM Team Scheduled appointments     demonstrate Improved health management independence as evidenced by attending all medical appointments, working with BSW to address transportation barriers, contacting pharmacy to obtain refills, and transferring all prescriptions to [facility] for home delivery        demonstrate ongoing self health care management ability   as evidenced by     work with Child psychotherapist to address Family and relationship dysfunction, Limited social support, Memory Deficits, Social Isolation, and Transportation related to the management of Dementia and Depression as evidenced by review of EMR and patient or Child psychotherapist report      Interventions:   Dementia:  (Status:  New goal.)  Short Term Goal Evaluation of current treatment plan related to misuse of: Dementia with anxiety Made referral to LCSW for transporation barriers, counseling for depression and familiy conflict resolution, Depression screen updated, Emotional Support Provided to patient/caregiver, Sleep hygiene recommendations and education provided, and Discussed importance of attendance to all provider appointments  Patient  Self-Care Activities:  Attend all scheduled provider appointments Call pharmacy for medication refills 3-7 days in advance of running out of medications Call provider office for new concerns or questions  Take medications as prescribed   Work with the social worker to address care coordination needs and will continue to work with the clinical team to address health care and disease management related needs  Plan:  Telephone follow up appointment with care management team member scheduled for:  06/26/23 at 3:00             Patient verbalizes understanding of instructions and care plan provided today and agrees to view in MyChart. Active MyChart status and patient understanding of how to access instructions and care plan via MyChart confirmed with patient.     Telephone follow up appointment with care management team member scheduled for:06/26/23   Please call the care guide team at [phone] if you need to cancel or reschedule your appointment.   Please call the Suicide and Crisis Lifeline: 988 call the [place] National Suicide Prevention Lifeline: (628) 737-7498 or TTY: 570-347-8276 TTY (940)178-1015) to talk to a trained counselor call 1-800-273-TALK (toll free, 24 hour hotline) if you are experiencing a Mental Health or Behavioral Health Crisis or need someone to talk to.   Ruel Favors BSN RN CCM [place]  [facility], [facility] RN Care Manager Direct Dial: [phone] Fax: [phone]

## 2023-06-12 NOTE — Patient Outreach (Signed)
 Complex Care Management   Visit Note  06/12/2023  Name:  Anne [name] MRN: [id number] DOB: 1945-09-11  Situation: Referral received for Complex Care Management related to Dementia, SDOH Barriers:  Transportation, and depression related to family stressors and loss of transportation.  I obtained verbal consent from patient.  Visit completed with patient  on the phone  Background:   Past Medical History:  Diagnosis Date   Acquired absence of both breasts and nipples 06/08/2015   Acute infection of nasal sinus 05/04/2021   Aortic atherosclerosis 01/30/2021   Asthma    daily inhalers   Ataxia 10/18/2020   Bilateral carpal tunnel syndrome 01/30/2021   Bipolar disorder, mixed 06/16/2021   Breast cancer of upper-inner quadrant of left female breast 04/15/2015   Bronchopneumonia 07/22/2022   Carotid artery disease 05/04/2016   Mild, 1-39% bilaterally by doppler in March 2018   Chronic pain in right shoulder 12/25/2020   COPD with acute exacerbation 07/03/2016   Dental crowns present    Drug induced constipation 04/10/2021   EIC (epidermal inclusion cyst) 03/21/2021   Family history of breast cancer in female 04/26/2015   Dx. Niece at 45; dx. Maternal aunt in her late 24s  Formatting of this note might be different from the original. Overview:  Dx. Niece at 53; dx. Maternal aunt in her late 71s   Family history of colon cancer 04/26/2015   Dx. In maternal grandfather in his late 68s-70  Formatting of this note might be different from the original. Overview:  Dx. In maternal grandfather in his late 74s-70   Frequent falls 10/18/2020   Genetic testing 06/02/2015   Negative for pathogenic mutations within any of 20 genes on the Breast/Ovarian Cancer Panel through [employer].  No variants of uncertain significance (VUSes) were found.  The Breast/Ovarian Cancer Panel offered by [facility] Basilio Cairo, MD) includes sequencing and deletion/duplication analysis for the  following 19 genes:  ATM, BARD1, BRCA1, BRCA2, BRIP1, CDH1, CHEK2, FANCC, M   History of basal cell carcinoma 04/26/2015   History of concussion 01/01/2014   History of kidney stones    History of TIA (transient ischemic attack) 10/2014   Hyperlipidemia    Insomnia    Lumbar back pain 10/18/2020   Major depressive disorder 10/18/2020   Malrotation of intestine 09/23/2017   Migraine aura without headache 02/08/2014   Migraine headache 01/30/2021   Nasal congestion 05/26/2015   will finish z-pak 05/27/2015   Nonproductive cough 05/26/2015   PONV (postoperative nausea and vomiting)    "long time ago"  none recently   Psychogenic syncope 04/23/2019   Right lumbar radiculopathy 04/23/2019   RLS (restless legs syndrome)    Simple chronic bronchitis 07/04/2019   Tobacco use    Tremor 10/18/2020   Vitamin D deficiency    Wears dentures    Weight loss 08/28/2019    Assessment: Patient Reported Symptoms:  Cognitive Alert and oriented to person, place, and time  Neurological No symptoms reported    HEENT No symptoms reported    Cardiovascular No symptoms reported    Respiratory   allergies attributed to seasonal and cat allergies  Endocrine No symptoms reported    Gastrointestinal No symptoms reported    Genitourinary No symptoms reported    Integumentary No symptoms reported    Musculoskeletal No symptoms reported    Psychosocial Depression - if selected complete PHQ 2-9, Alteration in eating habits     There were no vitals filed for this visit.  Medications  Reviewed Today     Reviewed by Ruel Favors, RN (Registered Nurse) on 06/12/23 at 1543  Med List Status: <None>   Medication Order Taking? Sig Documenting Provider Last Dose Status Informant  albuterol (PROVENTIL) (2.5 MG/3ML) 0.083% nebulizer solution [id number] Yes Take 3 mLs (2.5 mg total) by nebulization 4 (four) times daily. Cox, Kirsten, MD Taking Active   albuterol (VENTOLIN HFA) 108 (90 Base) MCG/ACT  inhaler [id number] Yes INHALE 1 TO 2 PUFFS INTO LUNGS EVERY 6 HOURS AS NEEDED FOR  WHEEZING  AND/OR  SHORTNESS  OF  Jeanella Anton, Kirsten, MD Taking Active   amitriptyline (ELAVIL) 25 MG tablet [id number] Yes Take 1 tablet (25 mg total) by mouth at bedtime. Cox, Kirsten, MD Taking Active   Atogepant ([name]) 60 MG TABS [id number] Yes Take 1 tablet (60 mg total) by mouth daily. Cox, Kirsten, MD Taking Active            Med Note Littie Deeds, CHERYL A   Wed May 01, 2023 12:16 PM) PAP  atorvastatin (LIPITOR) 10 MG tablet [id number] Yes Take 1 tablet (10 mg total) by mouth daily. Cox, Kirsten, MD Taking Active   azithromycin (ZITHROMAX Z-PAK) 250 MG tablet [id number] No 2 DAILY FOR FIRST DAY, THEN DECREASE TO ONE DAILY FOR 4 MORE DAYS.  Patient not taking: Reported on 06/12/2023   Blane Ohara, MD Not Taking Active            Med Note Julian Reil, Bentley Haralson   Wed Jun 12, 2023  3:33 PM) Patient reports completed the course  budeson-glycopyrrolate-formoterol (BREZTRI AEROSPHERE) 160-9-4.8 MCG/ACT Sandrea Matte [id number] Yes Inhale 2 puffs into the lungs 2 (two) times daily. Cox, Kirsten, MD Taking Active   cyanocobalamin (VITAMIN B12) 1000 MCG/ML injection [id number] Yes Inject 1 mL (1,000 mcg total) every 30 (thirty) days as directed. Cox, Kirsten, MD Taking Active   donepezil (ARICEPT ODT) 10 MG disintegrating tablet [id number]  Take 1 tablet (10 mg total) by mouth at bedtime. Cox, Kirsten, MD  Active   famotidine (PEPCID) 20 MG tablet [id number] Yes Take 1 tablet (20 mg total) by mouth 2 (two) times daily. Cox, Kirsten, MD Taking Active   gabapentin (NEURONTIN) 300 MG capsule [id number] Yes Take 1 capsule (300 mg total) by mouth 3 (three) times daily. Cox, Kirsten, MD Taking Active   HYDROcodone-acetaminophen (NORCO/VICODIN) 5-325 MG tablet [id number] No Take 1 tablet by mouth daily as needed for severe pain (migraines).  Patient not taking: Reported on 06/12/2023   Blane Ohara, MD Not Taking Active            Med Note Julian Reil, Mahima Hottle    Wed Jun 12, 2023  3:36 PM) Patient reports out with no refills  will contact pharmacy to refill  megestrol (MEGACE) 40 MG tablet [id number] Yes Take 1 tablet (40 mg total) by mouth daily. Cox, Kirsten, MD Taking Active   meloxicam [facility]) 15 MG tablet [id number] Yes Take 1 tablet (15 mg total) by mouth daily. Cox, Kirsten, MD Taking Active   memantine [facility]) 10 MG tablet [id number] Yes Take 1 tablet (10 mg total) by mouth 2 (two) times daily. Cox, Kirsten, MD Taking Active   methocarbamol (ROBAXIN) 750 MG tablet [id number] Yes Take 1 tablet (750 mg total) by mouth in the morning and at bedtime. Cox, Kirsten, MD Taking Active   midodrine (PROAMATINE) 5 MG tablet [id number] Yes Take 1 tablet (5 mg total) by mouth 3 (three) times daily with meals. Blane Ohara, MD Taking Active  Med Note Sullivan Lone, ASHLEY M   Fri May 31, 2023  2:45 PM) Patient taking differently. Taking 5 mg daily.  mirtazapine (REMERON) 45 MG tablet [id number] Yes Take 1 tablet (45 mg total) by mouth at bedtime. Cox, Kirsten, MD Taking Active   montelukast (SINGULAIR) 10 MG tablet [id number] Yes Take 1 tablet (10 mg total) by mouth daily. Cox, Kirsten, MD Taking Active   omeprazole (PRILOSEC) 40 MG capsule [id number] Yes Take 1 capsule (40 mg total) by mouth 2 (two) times daily. Cox, Kirsten, MD Taking Active   primidone (MYSOLINE) 50 MG tablet [id number] Yes Take 1 tablet (50 mg total) by mouth at bedtime. Cox, Kirsten, MD Taking Active   promethazine (PHENERGAN) 25 MG tablet [id number] Yes Take 1 tablet (25 mg total) by mouth every 8 (eight) hours as needed for nausea or vomiting. Cox, Kirsten, MD Taking Active   sertraline (ZOLOFT) 100 MG tablet [id number] No Take 1 tablet (100 mg total) by mouth daily.  Patient not taking: Reported on 06/12/2023   CoxFritzi Mandes, MD Not Taking Active            Med Note Julian Reil, Aubrielle Stroud   Wed Jun 12, 2023  3:41 PM) Patient reports has none on hand, will contact pharmacy  SYRINGE-NEEDLE, DISP, 3 ML (LUER  LOCK SAFETY SYRINGES) 25G X 1" 3 ML MISC [id number] Yes Inject 1 each into the muscle every 30 (thirty) days. Cox, Kirsten, MD Taking Active   traZODone (DESYREL) 100 MG tablet [id number] Yes Take 1 tablet (100 mg total) by mouth at bedtime. for sleep Cox, Kirsten, MD Taking Active   Ubrogepant (UBRELVY) 100 MG TABS [id number] No One tablet at onset of migraine, if needed a second dose may be taken at least 2 hours after the initial dose  Patient not taking: Reported on 06/12/2023   Blane Ohara, MD Not Taking Active            Med Note Julian Reil, Narely Nobles   Wed Jun 12, 2023  3:43 PM) Patient doesn't have on hand will call pharmacy to refill  Vitamin D, Ergocalciferol, (DRISDOL) 1.25 MG (50000 UNIT) CAPS capsule [id number]  Take 1 capsule (50,000 Units total) by mouth every 7 (seven) days.  Patient not taking: Reported on 05/31/2023   Blane Ohara, MD  Active             Recommendation:   Referral to: [facility] LCSW  Follow Up Plan:   Telephone follow up appointment date/time:  06/26/23 at 3:00  Ruel Favors BSN RN CCM [place]  [facility], [facility] RN Care Manager Direct Dial: [phone] Fax: [phone]

## 2023-06-17 ENCOUNTER — Telehealth: Payer: Self-pay

## 2023-06-18 NOTE — Progress Notes (Signed)
 Thank you for this referral. The [facility] Population Health team receives referrals for eligible patients: all patients with [facility] Primary Care Provider (any health plan including Medicaid or uninsured) and patients with a [facility] Primary Care Provider AND contracted health plan.     Patient enrolled in PACE per son Gonzalo Lather.  [facility]  [facility], [facility] Care Management Assistant Direct Dial: [phone]  Fax: [phone]

## 2023-06-26 ENCOUNTER — Telehealth

## 2023-06-26 ENCOUNTER — Other Ambulatory Visit: Payer: Self-pay

## 2023-06-26 NOTE — Patient Outreach (Signed)
 Complex Care Management   Visit Note  06/26/2023  Name:  Anne [name] MRN: [id number] DOB: 1945/03/29  Situation: Referral received for Complex Care Management related to COPD, Menta/Behavioral Health diagnosis Depression and Anxiety, and Dementia I obtained verbal consent from Patient.  Visit completed with patient  on the phone  Background:   Past Medical History:  Diagnosis Date   Acquired absence of both breasts and nipples 06/08/2015   Acute infection of nasal sinus 05/04/2021   Aortic atherosclerosis 01/30/2021   Asthma    daily inhalers   Ataxia 10/18/2020   Bilateral carpal tunnel syndrome 01/30/2021   Bipolar disorder, mixed 06/16/2021   Breast cancer of upper-inner quadrant of left female breast 04/15/2015   Bronchopneumonia 07/22/2022   Carotid artery disease 05/04/2016   Mild, 1-39% bilaterally by doppler in March 2018   Chronic pain in right shoulder 12/25/2020   COPD with acute exacerbation 07/03/2016   Dental crowns present    Drug induced constipation 04/10/2021   EIC (epidermal inclusion cyst) 03/21/2021   Family history of breast cancer in female 04/26/2015   Dx. Niece at 20; dx. Maternal aunt in her late 68s  Formatting of this note might be different from the original. Overview:  Dx. Niece at 94; dx. Maternal aunt in her late 54s   Family history of colon cancer 04/26/2015   Dx. In maternal grandfather in his late 81s-70  Formatting of this note might be different from the original. Overview:  Dx. In maternal grandfather in his late 59s-70   Frequent falls 10/18/2020   Genetic testing 06/02/2015   Negative for pathogenic mutations within any of 20 genes on the Breast/Ovarian Cancer Panel through [employer].  No variants of uncertain significance (VUSes) were found.  The Breast/Ovarian Cancer Panel offered by [facility] Astrid Blamer, MD) includes sequencing and deletion/duplication analysis for the following 19 genes:  ATM, BARD1, BRCA1,  BRCA2, BRIP1, CDH1, CHEK2, FANCC, M   History of basal cell carcinoma 04/26/2015   History of concussion 01/01/2014   History of kidney stones    History of TIA (transient ischemic attack) 10/2014   Hyperlipidemia    Insomnia    Lumbar back pain 10/18/2020   Major depressive disorder 10/18/2020   Malrotation of intestine 09/23/2017   Migraine aura without headache 02/08/2014   Migraine headache 01/30/2021   Nasal congestion 05/26/2015   will finish z-pak 05/27/2015   Nonproductive cough 05/26/2015   PONV (postoperative nausea and vomiting)    "long time ago"  none recently   Psychogenic syncope 04/23/2019   Right lumbar radiculopathy 04/23/2019   RLS (restless legs syndrome)    Simple chronic bronchitis 07/04/2019   Tobacco use    Tremor 10/18/2020   Vitamin D  deficiency    Wears dentures    Weight loss 08/28/2019    Assessment: Patient Reported Symptoms:  Cognitive Cognitive Status: Alert and oriented to person, place, and time Cognitive/Intellectual Conditions Management [RPT]: Other (PCP documents dementia, patient disagrees)   Health Maintenance Behaviors: None Healing Pattern: Unsure Health Facilitated by: Rest  Neurological Neurological Review of Symptoms: Headaches (routine migraines) Neurological Conditions: Headache Neurological Management Strategies: Medication therapy, Routine screening  HEENT HEENT Symptoms Reported: Nasal discharge (seasonal allergies) HEENT Management Strategies: Coping strategies, Routine screening, Medication therapy HEENT Self-Management Outcome: 4 (good)    Cardiovascular Cardiovascular Symptoms Reported: No symptoms reported Does patient have uncontrolled Hypertension?: No (controlled with medication) Cardiovascular Conditions: Hypertension Cardiovascular Management Strategies: Medication therapy, Routine screening, Weight management, Exercise Do You  Have a Working Readable Scale?: Yes Weight: 90 lb (40.8 kg)  Respiratory  Respiratory Symptoms Reported: Dry cough, Chest tightness, Shortness of breath Respiratory Conditions: COPD, Seasonal allergies Respiratory Self-Management Outcome: 3 (uncertain) Respiratory Comment: patient attributes exacerbation COPD to seasonal allergies  Endocrine Is patient diabetic?: No (patient A1C 5.9  RNCM counseled on dietary changes.) Endocrine Management Strategies: Adequate rest, Diet modification, Exercise, Routine screening, Weight management  Gastrointestinal Gastrointestinal Symptoms Reported: No symptoms reported      Genitourinary Genitourinary Symptoms Reported: No symptoms reported    Integumentary Integumentary Symptoms Reported: Bruising (unexplained bruising, RNCM recommended follow up with PCP) Skin Management Strategies: Routine screening  Musculoskeletal Musculoskelatal Symptoms Reviewed: No symptoms reported        Psychosocial Psychosocial Symptoms Reported: Alteration in eating habits, Depression - if selected complete PHQ 2-9, Anxiety - if selected complete GAD Behavioral Health Conditions: Anxiety, Dementia, Depression (referral submitted for depression, anxiety and social isolation) Behavioral Management Strategies: Counseling, Medication therapy Major Change/Loss/Stressor/Fears (CP): Medical condition, self, Relationship concerns Techniques to Cope with Loss/Stress/Change: Counseling, Medication (patient will refill rx for Zoloft ) Quality of Family Relationships: stressful, helpful, involved, supportive Do you feel physically threatened by others?: No      06/26/2023    4:09 PM  Depression screen PHQ 2/9  Decreased Interest 1  Down, Depressed, Hopeless 1  PHQ - 2 Score 2  Altered sleeping 2  Tired, decreased energy 1  Change in appetite 1  Feeling bad or failure about yourself  0  Trouble concentrating 1  Moving slowly or fidgety/restless 0  Suicidal thoughts 0  PHQ-9 Score 7  Difficult doing work/chores Not difficult at all    There were  no vitals filed for this visit.  Medications Reviewed Today     Reviewed by Clarnce Crow, RN (Registered Nurse) on 06/26/23 at 1545  Med List Status: <None>   Medication Order Taking? Sig Documenting Provider Last Dose Status Informant  albuterol  (PROVENTIL ) (2.5 MG/3ML) 0.083% nebulizer solution [id number] Yes Take 3 mLs (2.5 mg total) by nebulization 4 (four) times daily. Cox, Kirsten, MD Taking Active   albuterol  (VENTOLIN  HFA) 108 [phone] Base) MCG/ACT inhaler [id number] Yes INHALE 1 TO 2 PUFFS INTO LUNGS EVERY 6 HOURS AS NEEDED FOR  WHEEZING  AND/OR  SHORTNESS  OF  Jolie Neat, Kirsten, MD Taking Active   amitriptyline  (ELAVIL ) 25 MG tablet [id number] Yes Take 1 tablet (25 mg total) by mouth at bedtime. Cox, Kirsten, MD Taking Active   Atogepant  ([name] ) 60 MG TABS [id number] Yes Take 1 tablet (60 mg total) by mouth daily. Cox, Kirsten, MD Taking Active            Med Note Finley Hugh, CHERYL A   Wed May 01, 2023 12:16 PM) PAP  atorvastatin  (LIPITOR) 10 MG tablet [id number] Yes Take 1 tablet (10 mg total) by mouth daily. Cox, Kirsten, MD Taking Active   azithromycin  (ZITHROMAX  Z-PAK) 250 MG tablet [id number]  2 DAILY FOR FIRST DAY, THEN DECREASE TO ONE DAILY FOR 4 MORE DAYS.  Patient not taking: Reported on 06/12/2023   CoxBurleigh Carp, MD  Active            Med Note Burley Carpenter, Braylee Lal   Wed Jun 12, 2023  3:33 PM) Patient reports completed the course  budeson-glycopyrrolate -formoterol (BREZTRI  AEROSPHERE) 160-9-4.8 MCG/ACT AERO [id number] Yes Inhale 2 puffs into the lungs 2 (two) times daily. Cox, Kirsten, MD Taking Active   cyanocobalamin  (VITAMIN B12) 1000 MCG/ML injection [id number] Yes Inject 1 mL (1,000 mcg total)  every 30 (thirty) days as directed. Cox, Kirsten, MD Taking Active   donepezil  (ARICEPT  ODT) 10 MG disintegrating tablet [id number] Yes Take 1 tablet (10 mg total) by mouth at bedtime. Cox, Kirsten, MD Taking Active   famotidine  (PEPCID ) 20 MG tablet [id number] Yes Take 1 tablet (20 mg total) by mouth  2 (two) times daily. Cox, Kirsten, MD Taking Active   gabapentin  (NEURONTIN ) 300 MG capsule [id number] Yes Take 1 capsule (300 mg total) by mouth 3 (three) times daily. Cox, Kirsten, MD Taking Active   HYDROcodone -acetaminophen  (NORCO/VICODIN) 5-325 MG tablet [id number] Yes Take 1 tablet by mouth daily as needed for severe pain (migraines). Cox, Kirsten, MD Taking Active            Med Note Burley Carpenter, Seferino Oscar   Wed Jun 12, 2023  3:36 PM) Patient reports out with no refills  will contact pharmacy to refill  megestrol  (MEGACE ) 40 MG tablet [id number] Yes Take 1 tablet (40 mg total) by mouth daily. Cox, Kirsten, MD Taking Active   meloxicam  (MOBIC ) 15 MG tablet [id number] Yes Take 1 tablet (15 mg total) by mouth daily. Cox, Kirsten, MD Taking Active   memantine  (NAMENDA ) 10 MG tablet [id number] Yes Take 1 tablet (10 mg total) by mouth 2 (two) times daily. Cox, Kirsten, MD Taking Active   methocarbamol  (ROBAXIN ) 750 MG tablet [id number] Yes Take 1 tablet (750 mg total) by mouth in the morning and at bedtime. Cox, Kirsten, MD Taking Active   midodrine  (PROAMATINE ) 5 MG tablet [id number] Yes Take 1 tablet (5 mg total) by mouth 3 (three) times daily with meals. CoxBurleigh Carp, MD Taking Active            Med Note Oletta Berry, ASHLEY M   Fri May 31, 2023  2:45 PM) Patient taking differently. Taking 5 mg daily.  mirtazapine  (REMERON ) 45 MG tablet [id number] Yes Take 1 tablet (45 mg total) by mouth at bedtime. Cox, Kirsten, MD Taking Active   montelukast  (SINGULAIR ) 10 MG tablet [id number] Yes Take 1 tablet (10 mg total) by mouth daily. Cox, Kirsten, MD Taking Active   omeprazole  (PRILOSEC) 40 MG capsule [id number] Yes Take 1 capsule (40 mg total) by mouth 2 (two) times daily. Cox, Kirsten, MD Taking Active   primidone  (MYSOLINE ) 50 MG tablet [id number] Yes Take 1 tablet (50 mg total) by mouth at bedtime. Cox, Kirsten, MD Taking Active   promethazine  (PHENERGAN ) 25 MG tablet [id number] Yes Take 1 tablet (25 mg total) by mouth every  8 (eight) hours as needed for nausea or vomiting. Cox, Kirsten, MD Taking Active   sertraline  (ZOLOFT ) 100 MG tablet [id number] Yes Take 1 tablet (100 mg total) by mouth daily. Cox, Kirsten, MD Taking Active            Med Note Burley Carpenter, Rowin Bayron   Wed Jun 12, 2023  3:41 PM) Patient reports has none on hand, will contact pharmacy  SYRINGE-NEEDLE, DISP, 3 ML (LUER LOCK SAFETY SYRINGES) 25G X 1" 3 ML MISC [id number] Yes Inject 1 each into the muscle every 30 (thirty) days. Cox, Kirsten, MD Taking Active   traZODone  (DESYREL ) 100 MG tablet [id number] Yes Take 1 tablet (100 mg total) by mouth at bedtime. for sleep Cox, Burleigh Carp, MD Taking Active   Ubrogepant  (UBRELVY ) 100 MG TABS [id number] Yes One tablet at onset of migraine, if needed a second dose may be taken at least 2 hours after the initial dose Cox, Burleigh Carp, MD Taking Active  Med Note Burley Carpenter, Lauranne Beyersdorf   Wed Jun 12, 2023  3:43 PM) Patient doesn't have on hand will call pharmacy to refill  Vitamin D , Ergocalciferol , (DRISDOL ) 1.25 MG (50000 UNIT) CAPS capsule [id number] Yes Take 1 capsule (50,000 Units total) by mouth every 7 (seven) days. Mercy Stall, MD Taking Active             Recommendation:   PCP Follow-up.  Patient will connect with [facility] to access MH resources for counseling services for depression and anxiety.  Patient will also contact pharmacy to coordinate refills of MH medication   Follow Up Plan:   Telephone follow up appointment date/time:  07/10/23 at 3:00   Clarnce Crow BSN RN CCM [place]  [facility], [facility] RN Care Manager Direct Dial: [phone] Fax: [phone]

## 2023-06-26 NOTE — Patient Instructions (Signed)
 Visit Information  Thank you for taking time to visit with me today. Please don't hesitate to contact me if I can be of assistance to you before our next scheduled appointment.  Our next appointment is by telephone on 07/10/23 at 3:00 pm Please call the care guide team at [phone] if you need to cancel or reschedule your appointment.   Following is a copy of your care plan:   Goals Addressed             This Visit's Progress    VBCI RN Care Plan   On track    Problems:  Chronic Disease Management support and education needs related to COPD, Dementia, and Depression  Goal: Over the next 2 weeks the Patient will attend all scheduled medical appointments: and take all medication as prescribed  as evidenced by chart review and patient report        continue to work with RN Care Manager and/or Social Worker to address care management and care coordination needs related to COPD, Dementia, and Depression as evidenced by adherence to care management team scheduled appointments     demonstrate Improved and Ongoing health management independence as evidenced by attending all medical appointments, working with BSW to address transportation barriers, contacting pharmacy to obtain refills, and transferring all prescriptions to [facility] for home delivery        demonstrate ongoing self health care management ability taking all medications as prescribed and attending all medical appointments as evidenced by     take all medications exactly as prescribed and will call provider for medication related questions as evidenced by chart review and patient report     Interventions:   Dementia:  (Status:  New goal.)  Short Term Goal Evaluation of current treatment plan related to misuse of: Dementia with anxiety Patient Self-Care Activities:   patient will contact [facility] to request counseling for Depression and Anxiety. Attend all scheduled provider appointments Call pharmacy for medication  refills 3-7 days in advance of running out of medications Call provider office for new concerns or questions  Take medications as prescribed   Work with the social worker to address care coordination needs and will continue to work with the clinical team to address health care and disease management related needs  Plan:  Telephone follow up appointment with care management team member scheduled for:  05/07/ at 3:00             Please call the Suicide and Crisis Lifeline: 988 call the [place]  National Suicide Prevention Lifeline: (260) 107-5044 or TTY: (970)267-3676 TTY 564-330-6015) to talk to a trained counselor call 1-800-273-TALK (toll free, 24 hour hotline) if you are experiencing a Mental Health or Behavioral Health Crisis or need someone to talk to.  Patient verbalizes understanding of instructions and care plan provided today and agrees to view in MyChart. Active MyChart status and patient understanding of how to access instructions and care plan via MyChart confirmed with patient.      Clarnce Crow BSN RN CCM   [facility], [facility] RN Care Manager Direct Dial: [phone] Fax: [phone]

## 2023-07-03 ENCOUNTER — Ambulatory Visit: Admitting: Family Medicine

## 2023-07-04 ENCOUNTER — Encounter: Payer: Self-pay | Admitting: Family Medicine

## 2023-07-04 IMAGING — US US CAROTID DUPLEX BILAT
1 series · 13 of 24 positions shown · non-contrast
Comparison: Prior duplex carotid ultrasound 01/09/2019

CLINICAL DATA: Left carotid stenosis, vision changes

EXAM:
BILATERAL CAROTID DUPLEX ULTRASOUND
TECHNIQUE: Gray scale imaging, color Doppler and duplex ultrasound were
performed of bilateral carotid and vertebral arteries in the neck.

[Series 1: us carotid duplex bilat · 0.07mm/px · 13 of 60 slices shown]
[im 1/60]
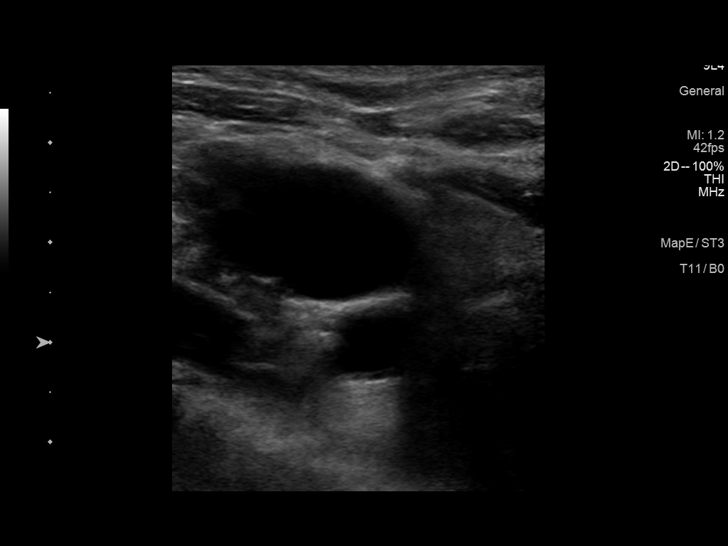
[im 6/60]
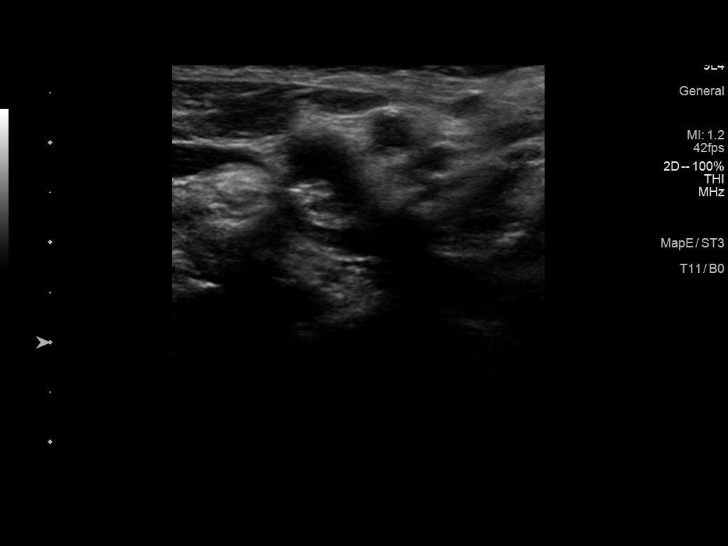
[im 11/60]
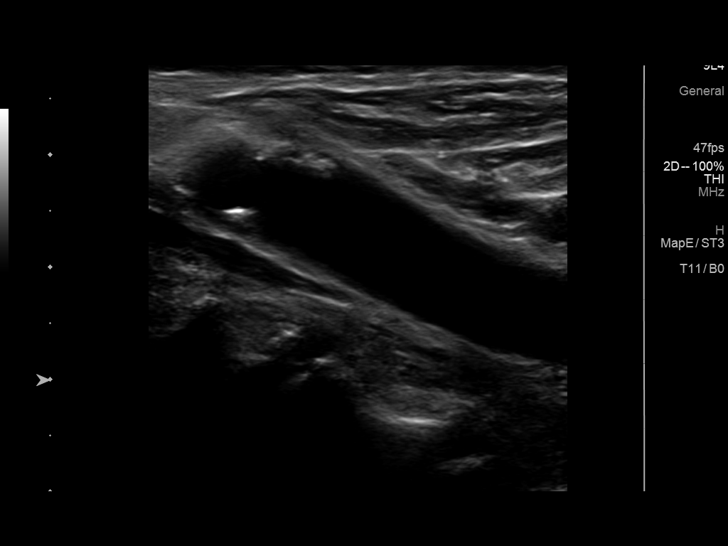
[im 16/60]
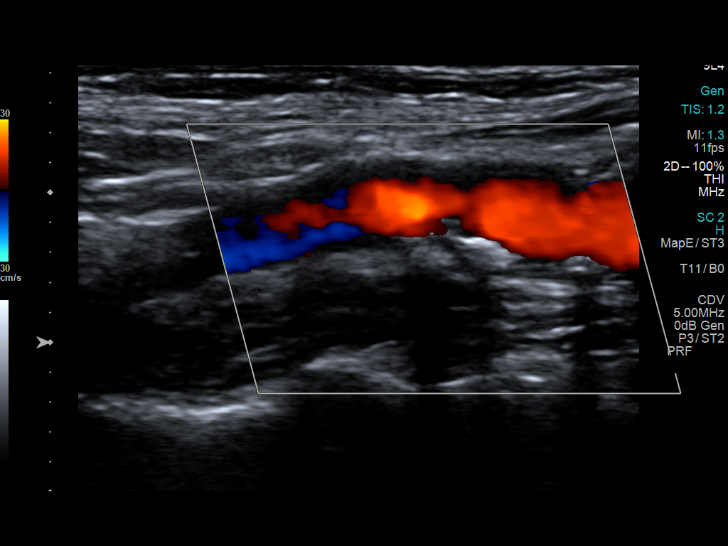
[im 21/60]
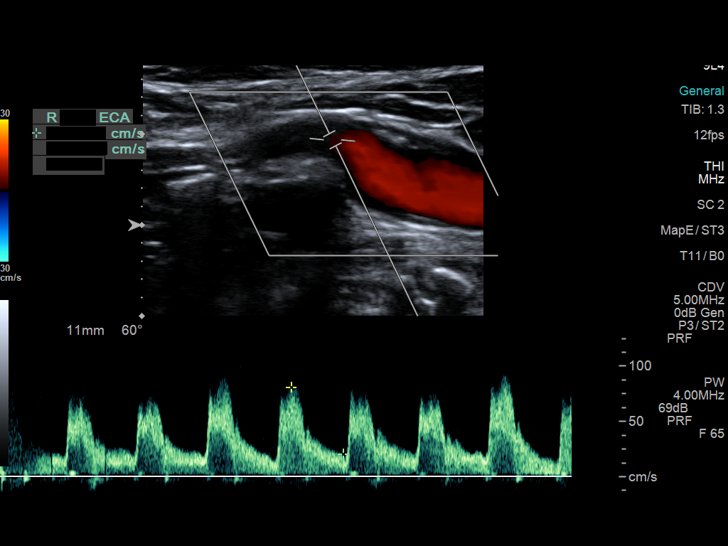
[im 26/60]
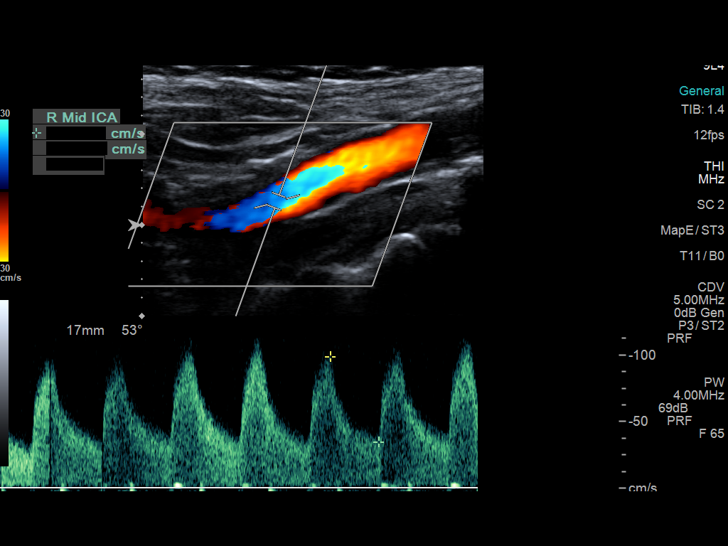
[im 31/60]
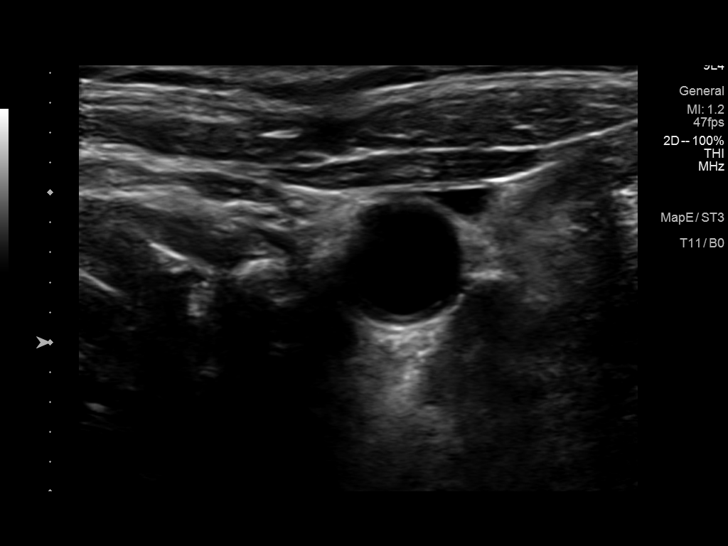
[im 34/60]
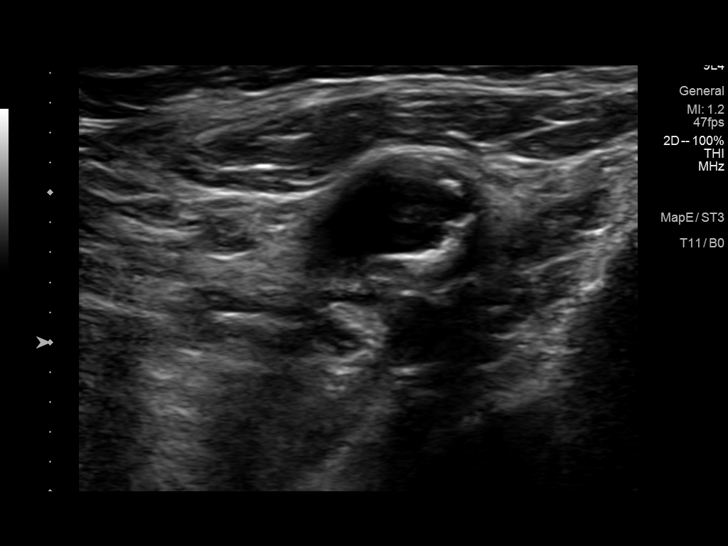
[im 39/60]
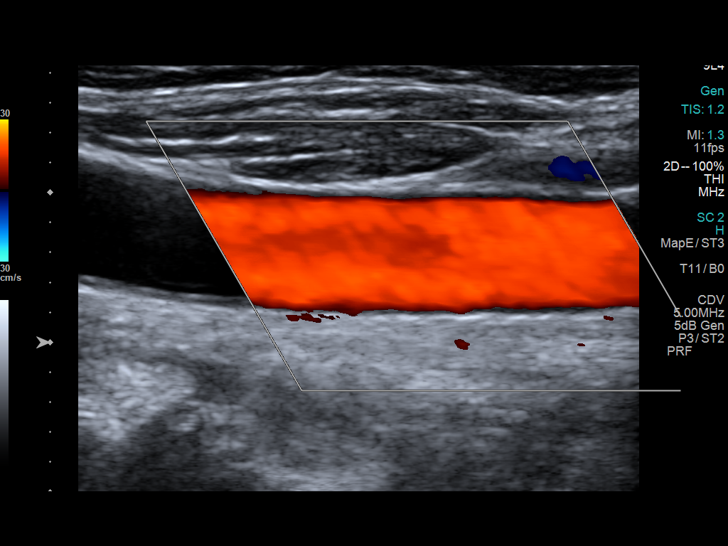
[im 44/60]
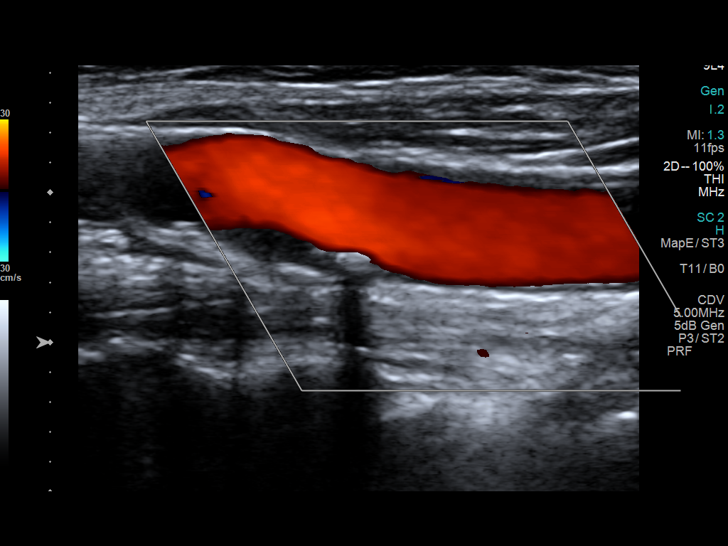
[im 49/60]
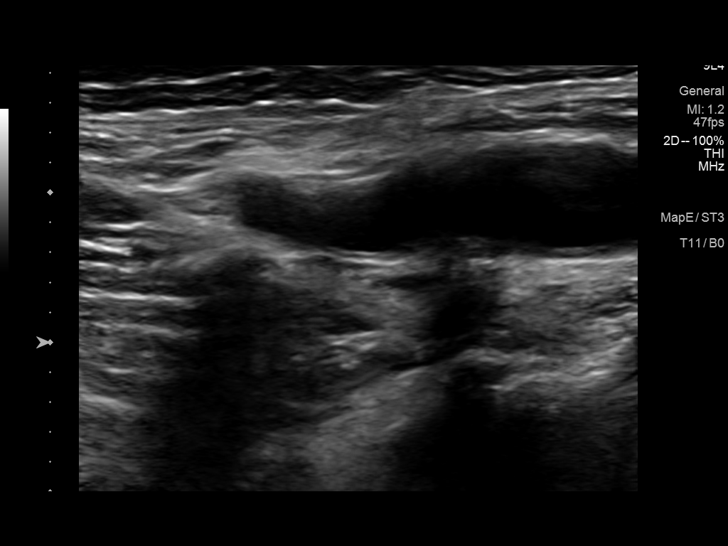
[im 54/60]
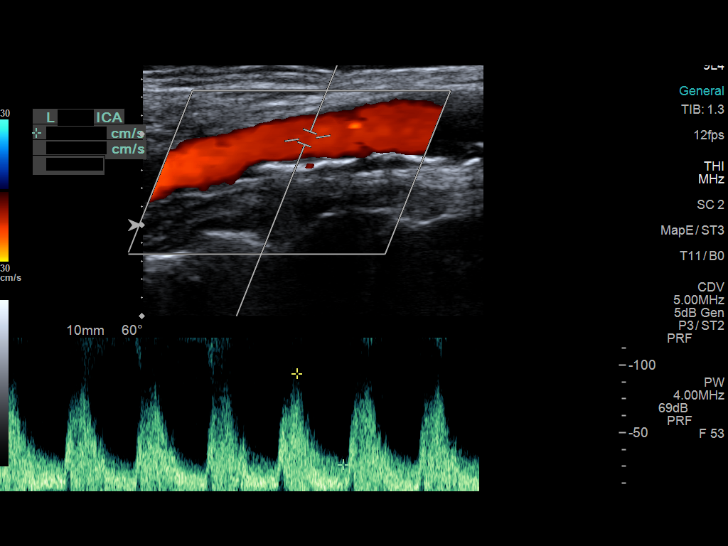
[im 60/60]
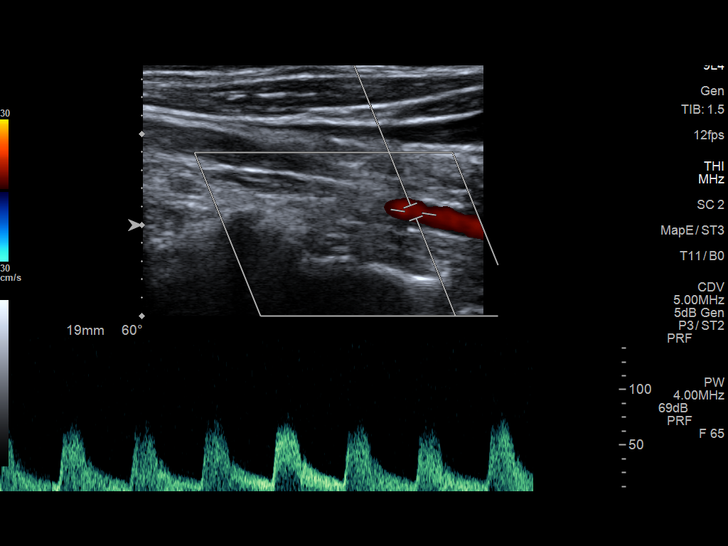

[13 of 24 positions shown; findings below may reference images not displayed]

FINDINGS: Criteria: Quantification of carotid stenosis is based on velocity
parameters that correlate the residual internal carotid diameter
with NASCET-based stenosis levels, using the diameter of the distal
internal carotid lumen as the denominator for stenosis measurement.

The following velocity measurements were obtained:

RIGHT
ICA: 156/40 cm/sec
CCA: 78/25 cm/sec

SYSTOLIC ICA/CCA RATIO:

ECA:  81 cm/sec

LEFT

ICA: 24/32 cm/sec

CCA: 74/23 cm/sec

SYSTOLIC ICA/CCA RATIO:

ECA:  89 cm/sec

RIGHT CAROTID ARTERY: Mild heterogeneous atherosclerotic plaque in
the proximal internal carotid artery. As before, the peak systolic
velocity in the region of atherosclerotic plaque estimates a
stenosis of less than 50%. More significantly elevated peak systolic
velocity in the distal internal carotid artery is noted but may be
spurious.

RIGHT VERTEBRAL ARTERY:  Patent with normal antegrade flow.

LEFT CAROTID ARTERY: Mild atherosclerotic plaque in the proximal
internal carotid artery. On today's examination, peak systolic
velocity criteria suggest a less than 50% diameter stenosis. This is
an improvement compared to prior imaging.

LEFT VERTEBRAL ARTERY:  Patent with normal antegrade flow.
IMPRESSION: 1. On today's examination, peak systolic velocities are lower on the
left than previously seen effectively down grading the degree of
suspected stenosis.
2. Mild (1-49%) stenosis proximal right internal carotid artery
secondary to mild heterogeneous atherosclerotic plaque.
3. Mild (1-49%) stenosis proximal left internal carotid artery
secondary to mild heterogeneous atherosclerotic plaque.
4. Vertebral arteries are patent with normal antegrade flow.

## 2023-07-05 ENCOUNTER — Encounter: Payer: Self-pay | Admitting: Family Medicine

## 2023-07-10 ENCOUNTER — Other Ambulatory Visit: Payer: Self-pay

## 2023-07-10 NOTE — Patient Outreach (Signed)
 Complex Care Management   Visit Note  07/10/2023  Name:  Anne [name] MRN: [id number] DOB: [birth year]  Situation: Referral received for Complex Care Management related to [facility]/Behavioral Health diagnosis Depression and HTN  I obtained verbal consent from Patient.  Visit completed with patient  on the phone  Background:   Past Medical History:  Diagnosis Date   Acquired absence of both breasts and nipples 06/08/2015   Acute infection of nasal sinus 05/04/2021   Aortic atherosclerosis 01/30/2021   Asthma    daily inhalers   Ataxia 10/18/2020   Bilateral carpal tunnel syndrome 01/30/2021   Bipolar disorder, mixed 06/16/2021   Breast cancer of upper-inner quadrant of left female breast 04/15/2015   Bronchopneumonia 07/22/2022   Carotid artery disease 05/04/2016   Mild, 1-39% bilaterally by doppler in March 2018   Chronic pain in right shoulder 12/25/2020   COPD with acute exacerbation 07/03/2016   Dental crowns present    Drug induced constipation 04/10/2021   EIC (epidermal inclusion cyst) 03/21/2021   Family history of breast cancer in female 04/26/2015   Dx. Niece at 5; dx. Maternal aunt in her late 44s  Formatting of this note might be different from the original. Overview:  Dx. Niece at 5; dx. Maternal aunt in her late 68s   Family history of colon cancer 04/26/2015   Dx. In maternal grandfather in his late 59s-70  Formatting of this note might be different from the original. Overview:  Dx. In maternal grandfather in his late 75s-70   Frequent falls 10/18/2020   Genetic testing 06/02/2015   Negative for pathogenic mutations within any of 20 genes on the Breast/Ovarian Cancer Panel through [employer].  No variants of uncertain significance (VUSes) were found.  The Breast/Ovarian Cancer Panel offered by [facility] Astrid Blamer, MD) includes sequencing and deletion/duplication analysis for the following 19 genes:  ATM, BARD1, BRCA1, BRCA2, BRIP1, CDH1, CHEK2,  FANCC, M   History of basal cell carcinoma 04/26/2015   History of concussion 01/01/2014   History of kidney stones    History of TIA (transient ischemic attack) 10/2014   Hyperlipidemia    Insomnia    Lumbar back pain 10/18/2020   Major depressive disorder 10/18/2020   Malrotation of intestine 09/23/2017   Migraine aura without headache 02/08/2014   Migraine headache 01/30/2021   Nasal congestion 05/26/2015   will finish z-pak 05/27/2015   Nonproductive cough 05/26/2015   PONV (postoperative nausea and vomiting)    "long time ago"  none recently   Psychogenic syncope 04/23/2019   Right lumbar radiculopathy 04/23/2019   RLS (restless legs syndrome)    Simple chronic bronchitis 07/04/2019   Tobacco use    Tremor 10/18/2020   Vitamin D  deficiency    Wears dentures    Weight loss 08/28/2019    Assessment: Patient Reported Symptoms:  Cognitive        Neurological      HEENT        Cardiovascular      Respiratory      Endocrine      Gastrointestinal        Genitourinary      Integumentary      Musculoskeletal          Psychosocial              06/26/2023    4:09 PM  Depression screen PHQ 2/9  Decreased Interest 1  Down, Depressed, Hopeless 1  PHQ - 2 Score 2  Altered  sleeping 2  Tired, decreased energy 1  Change in appetite 1  Feeling bad or failure about yourself  0  Trouble concentrating 1  Moving slowly or fidgety/restless 0  Suicidal thoughts 0  PHQ-9 Score 7  Difficult doing work/chores Not difficult at all    There were no vitals filed for this visit.  Medications Reviewed Today   Medications were not reviewed in this encounter     Recommendation:   No further follow up recommended.  Patient disenrolled from [facility] CCM program  Follow Up Plan:   Closing From:  Complex Care Management   Clarnce Crow BSN RN CCM   [facility], [facility] RN Care Manager Direct Dial: [phone] Fax:  [phone]

## 2023-07-26 ENCOUNTER — Other Ambulatory Visit: Payer: Self-pay | Admitting: Family Medicine

## 2023-07-26 DIAGNOSIS — J418 Mixed simple and mucopurulent chronic bronchitis: Secondary | ICD-10-CM

## 2023-08-01 ENCOUNTER — Ambulatory Visit: Payer: Medicare HMO | Admitting: Family Medicine

## 2023-08-01 NOTE — Telephone Encounter (Addendum)
 Patient has been dismissed from our practice. Spoke with Dr. Reinhold Carbine regarding this, letter was sent out 07/05/2023, patient will need to establish medical care elsewhere.  Copied from CRM [id number]. Topic: Appointments - Scheduling Inquiry for Clinic >> Aug 01, 2023 11:51 AM Baldemar Lev wrote: Reason for CRM: Pt wants to return as a patient to Dr. Reinhold Carbine, she is cutting ties with [facility]. Please advise   Best contact: [phone]

## 2023-08-11 DIAGNOSIS — J449 Chronic obstructive pulmonary disease, unspecified: Secondary | ICD-10-CM | POA: Diagnosis not present

## 2023-09-09 IMAGING — CT CT CHEST W/O CM
1 of 2 series · 14 of 32 positions shown, 18 images · non-contrast
Comparison: CT chest done on 12/10/2018 chest radiographs done on
05/04/2021

CLINICAL DATA: Chronic cough



[Series 4: super d · axial · 0.63mm/px · z∈[+698,+983]mm · 14 of 399 slices shown, 18 images]
[im 21/399  mediastinal]
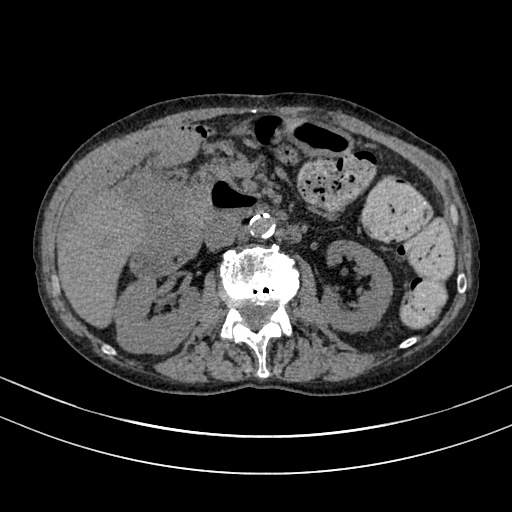
[im 21/399  lung]
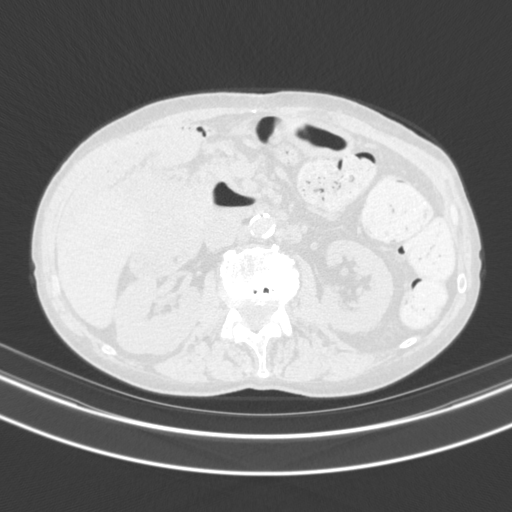
[im 63/399  lung]
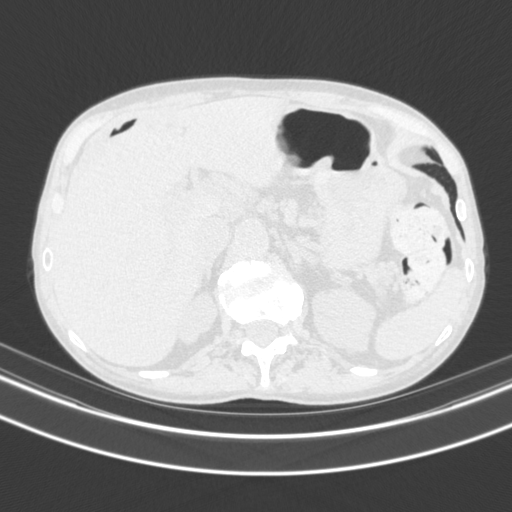
[im 84/399  lung]
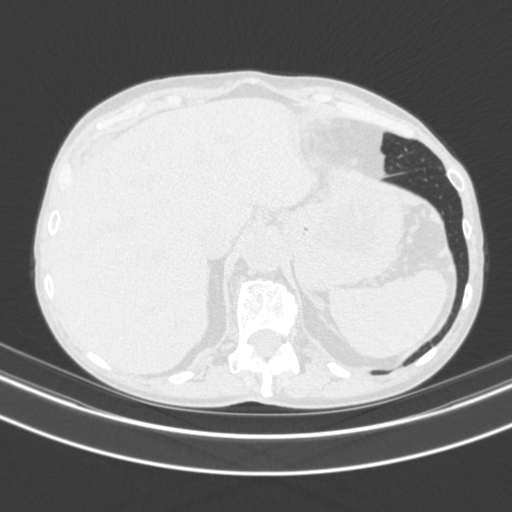
[im 126/399  lung]
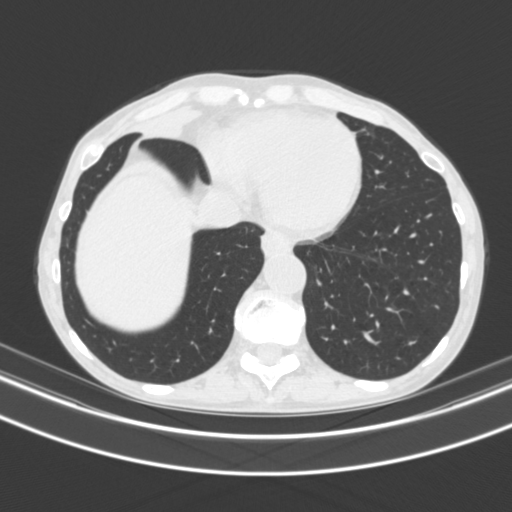
[im 133/399  mediastinal]
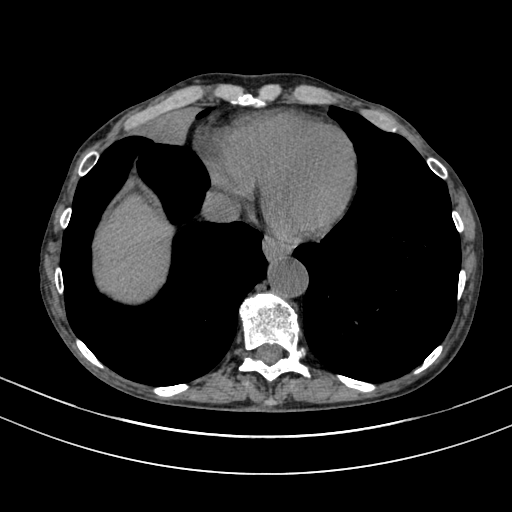
[im 133/399  lung]
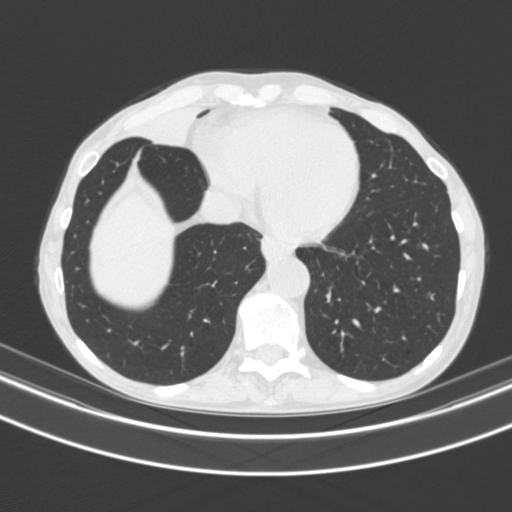
[im 168/399  lung]
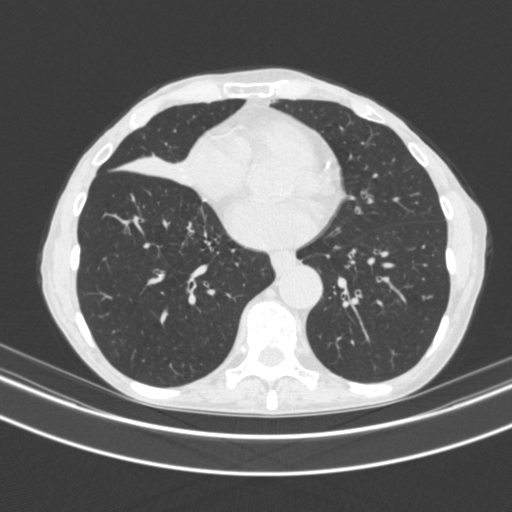
[im 188/399  lung]
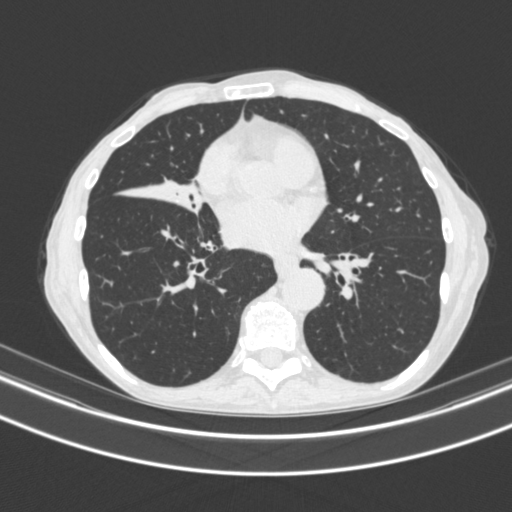
[im 210/399  lung]
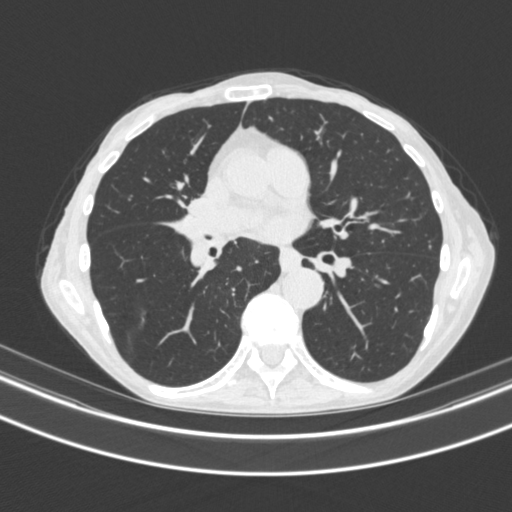
[im 231/399  mediastinal]
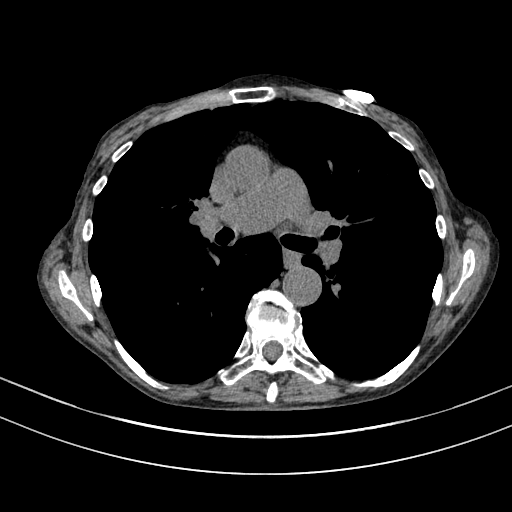
[im 231/399  lung]
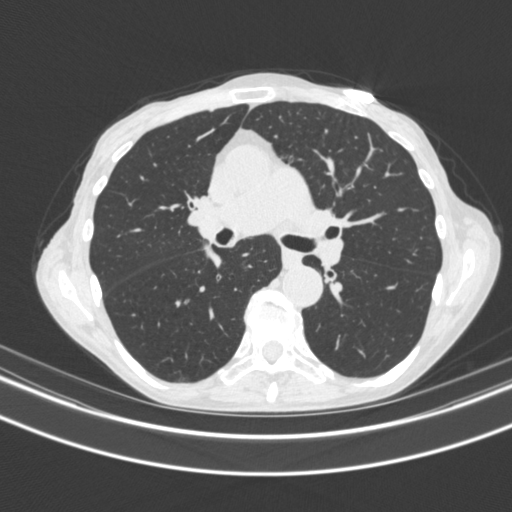
[im 266/399  lung]
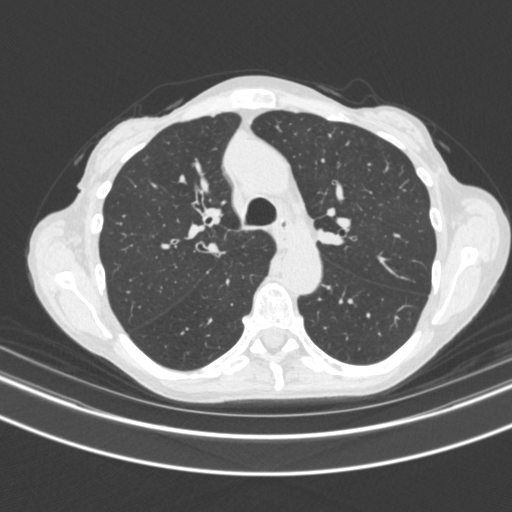
[im 273/399  lung]
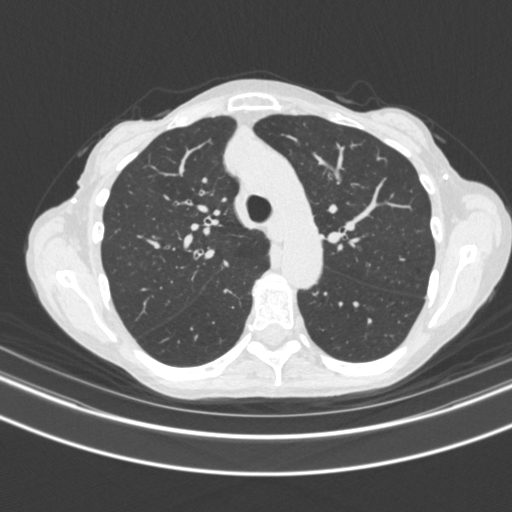
[im 315/399  lung]
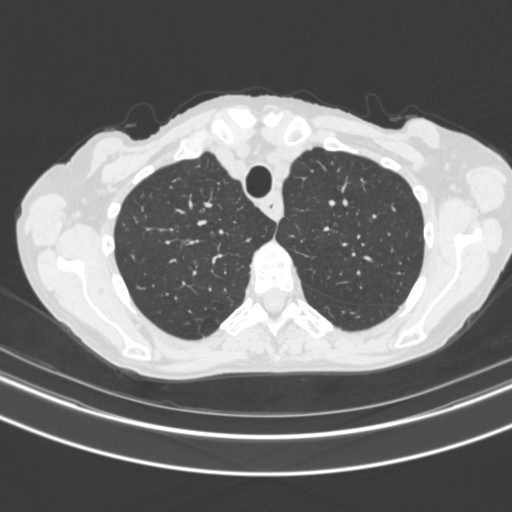
[im 336/399  mediastinal]
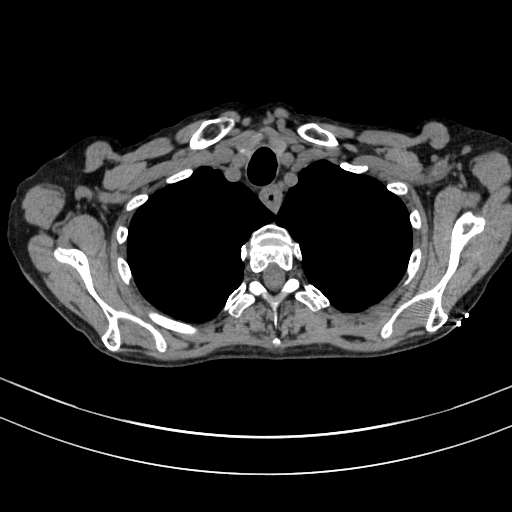
[im 336/399  lung]
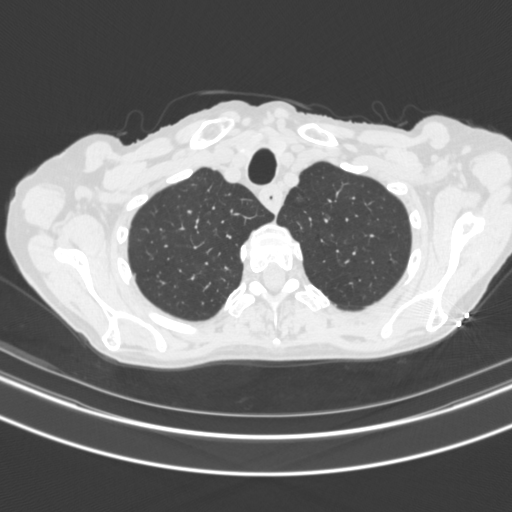
[im 378/399  lung]
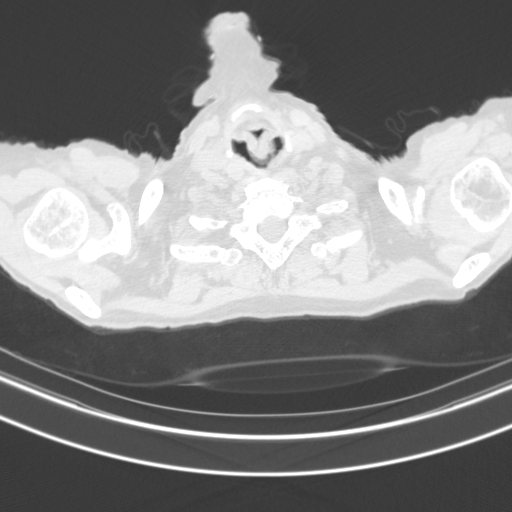

[14 of 32 positions shown; findings below may reference images not displayed]

FINDINGS: Cardiovascular: There are scattered coronary artery calcifications.
There are small scattered calcifications in the thoracic aorta.
There is ectasia of main pulmonary arteries with right main
pulmonary artery measuring 2.8 cm. This may suggest pulmonary
arterial hypertension.

Mediastinum/Nodes: Subcentimeter nodes in the mediastinum have not
changed significantly. There is inhomogeneous attenuation in the
thyroid with possible low-density nodules in the right lobe
measuring up to 12 mm.

Lungs/Pleura: There is interval appearance of dense atelectasis in
the right middle lobe with air bronchogram. In the image 62 of
series 3 there is 3 mm pleural-based nodule in the left lower lobe
which has not changed significantly. There are linear densities in
both apices suggesting scarring. There is 2 mm subpleural nodule in
the right upper lobe in image 30 which has not changed. In image 62,
there is a 2 mm subpleural nodule in the right upper lobe with no
significant interval change. Centrilobular emphysema is seen. In the
image 97, there is a 3 mm nodular density in the left lower lobe
which appears stable. There are no new discrete lung nodules. In the
image numbers 90, 91 and 92 small patchy infiltrate is seen in the
lateral aspect of right lower lobe which was not evident in the
previous study. There is no pleural effusion or pneumothorax.

Upper Abdomen: Surgical clips are seen in gallbladder fossa. Colon
is interposed between liver and anterior abdominal wall.

Musculoskeletal: There is significant interval worsening of
degenerative changes at L2-L3 level in the upper lumbar spine.
IMPRESSION: There is interval appearance of dense atelectasis in the right
middle lobe with air bronchogram. Possibility of obstructing lesion
in the bronchus is not excluded. Endoscopic correlation may be
considered.

New small patchy infiltrate is seen in the right lower lobe,
possibly small focus of pneumonia or scarring.

There are multiple small nodular densities each measuring less than
4 mm in size in both lungs with no significant interval change
suggesting possible granulomas. There is no pleural effusion.

Other findings as described in the body of the report.

## 2023-09-10 DIAGNOSIS — J449 Chronic obstructive pulmonary disease, unspecified: Secondary | ICD-10-CM | POA: Diagnosis not present

## 2023-10-11 DIAGNOSIS — J449 Chronic obstructive pulmonary disease, unspecified: Secondary | ICD-10-CM | POA: Diagnosis not present

## 2023-10-17 ENCOUNTER — Telehealth: Payer: Self-pay

## 2023-10-17 NOTE — Telephone Encounter (Signed)
 Copied from CRM [id number]. Topic: Appointments - Scheduling Inquiry for Clinic >> Oct 17, 2023 10:14 AM Anne [name] wrote: Reason for CRM: Pt is trying to schedule an appt but she has been dismissed from the practice. She says she has to have an appt scheduled to leave [facility]. Please call her back.

## 2023-10-18 NOTE — Telephone Encounter (Signed)
 Ladell the son of the patient called back in returning a call. I relayed what the provider said and he said he will get with his mother today and discuss things further and call back later if he needs anything including looking for a new primary care provider.

## 2023-10-18 NOTE — Telephone Encounter (Signed)
 Attempted to reach out to the patient. Home number has been disconnected and unable to leave a message on the cell phone, voicemail is full. Per Dr Sherre she has been dismissed from the practice and she will need to reach out to her new PCP for an appointment.

## 2023-10-21 NOTE — Telephone Encounter (Signed)
 I spoke with the patient's son and let him know she has been dismissed from the practice and would need to see a new PCP. Her son understands but said she forgets and may call again. He also said she is not leaving [facility] like she thinks she is.

## 2023-10-21 NOTE — Telephone Encounter (Signed)
 Per previous phone messages, patient has been dismissed from our practice. Patient will need to establish elsewhere for a new PCP. This information has been relayed to pts son per telephone note 10/17/23. This request was discussed with Dr. Sherre on 08/01/23 (see telephone note). No attempt made to contact patient.

## 2023-10-21 NOTE — Telephone Encounter (Unsigned)
 Copied from CRM [id number]. Topic: Appointments - Scheduling Inquiry for Clinic >> Oct 21, 2023 11:44 AM Sophia H wrote: Reason for CRM: Patient is checking in on request to see Dr. Sherre again. Looks like she was dismissed from clinic for no shows, patient states that her trimmers have gotten worse since being out of Dr. Stacy care and she just wants her doctor back. Please reach out to patient and advise # [phone]  Patient also states there was talk about possible dementia that she believes is progressing. She will have nothing to do with [facility] after August (she is paid up to end of the month)

## 2024-01-13 ENCOUNTER — Telehealth: Payer: Self-pay

## 2024-01-13 DIAGNOSIS — R251 Tremor, unspecified: Secondary | ICD-10-CM

## 2024-01-13 NOTE — Telephone Encounter (Signed)
 Pt is dismissed from [facility]. This RN made attempt to contact pt son and left call back number so pt can establish care at a different office.    Copied from CRM [id number]. Topic: Clinical - Medication Question >> Jan 13, 2024  4:10 PM Avram MATSU wrote: Reason for CRM: Son is calling for mothers tremor medication , I looked into pt chart and saw primidone  (MYSOLINE ) 50 MG tablet [[id number]] please advise [phone] (W)

## 2024-03-16 ENCOUNTER — Ambulatory Visit
